# Patient Record
Sex: Male | Born: 1935 | Race: White | Hispanic: No | Smoking: Former smoker
Health system: Southern US, Community
[De-identification: ages and names within clinical notes are randomized; demographics above are authoritative.]

## PROBLEM LIST (undated history)

## (undated) DIAGNOSIS — R351 Nocturia: Secondary | ICD-10-CM

## (undated) DIAGNOSIS — E236 Other disorders of pituitary gland: Secondary | ICD-10-CM

## (undated) DIAGNOSIS — I10 Essential (primary) hypertension: Secondary | ICD-10-CM

## (undated) DIAGNOSIS — E785 Hyperlipidemia, unspecified: Secondary | ICD-10-CM

## (undated) DIAGNOSIS — D649 Anemia, unspecified: Secondary | ICD-10-CM

## (undated) DIAGNOSIS — R4182 Altered mental status, unspecified: Secondary | ICD-10-CM

## (undated) DIAGNOSIS — E559 Vitamin D deficiency, unspecified: Secondary | ICD-10-CM

## (undated) DIAGNOSIS — I739 Peripheral vascular disease, unspecified: Secondary | ICD-10-CM

## (undated) DIAGNOSIS — E1142 Type 2 diabetes mellitus with diabetic polyneuropathy: Secondary | ICD-10-CM

## (undated) DIAGNOSIS — E119 Type 2 diabetes mellitus without complications: Secondary | ICD-10-CM

## (undated) DIAGNOSIS — K219 Gastro-esophageal reflux disease without esophagitis: Secondary | ICD-10-CM

## (undated) DIAGNOSIS — M869 Osteomyelitis, unspecified: Secondary | ICD-10-CM

## (undated) DIAGNOSIS — M1990 Unspecified osteoarthritis, unspecified site: Secondary | ICD-10-CM

## (undated) DIAGNOSIS — K529 Noninfective gastroenteritis and colitis, unspecified: Secondary | ICD-10-CM

## (undated) DIAGNOSIS — E039 Hypothyroidism, unspecified: Secondary | ICD-10-CM

## (undated) DIAGNOSIS — J309 Allergic rhinitis, unspecified: Secondary | ICD-10-CM

## (undated) DIAGNOSIS — N189 Chronic kidney disease, unspecified: Secondary | ICD-10-CM

## (undated) DIAGNOSIS — Z87442 Personal history of urinary calculi: Secondary | ICD-10-CM

## (undated) DIAGNOSIS — Z860100 Personal history of colon polyps, unspecified: Secondary | ICD-10-CM

## (undated) DIAGNOSIS — Z85528 Personal history of other malignant neoplasm of kidney: Secondary | ICD-10-CM

## (undated) DIAGNOSIS — N400 Enlarged prostate without lower urinary tract symptoms: Secondary | ICD-10-CM

## (undated) DIAGNOSIS — C801 Malignant (primary) neoplasm, unspecified: Secondary | ICD-10-CM

## (undated) DIAGNOSIS — H409 Unspecified glaucoma: Secondary | ICD-10-CM

## (undated) DIAGNOSIS — E893 Postprocedural hypopituitarism: Secondary | ICD-10-CM

## (undated) DIAGNOSIS — D519 Vitamin B12 deficiency anemia, unspecified: Secondary | ICD-10-CM

## (undated) HISTORY — PX: TONSILLECTOMY: SUR1361

## (undated) HISTORY — DX: Personal history of colon polyps, unspecified: Z86.0100

## (undated) HISTORY — DX: Allergic rhinitis, unspecified: J30.9

## (undated) HISTORY — DX: Noninfective gastroenteritis and colitis, unspecified: K52.9

## (undated) HISTORY — DX: Postprocedural hypopituitarism: E89.3

## (undated) HISTORY — DX: Hyperlipidemia, unspecified: E78.5

## (undated) HISTORY — DX: Gastro-esophageal reflux disease without esophagitis: K21.9

## (undated) HISTORY — PX: OTHER SURGICAL HISTORY: SHX169

## (undated) HISTORY — DX: Other disorders of pituitary gland: E23.6

## (undated) HISTORY — DX: Benign prostatic hyperplasia without lower urinary tract symptoms: N40.0

## (undated) HISTORY — DX: Unspecified glaucoma: H40.9

## (undated) HISTORY — DX: Vitamin B12 deficiency anemia, unspecified: D51.9

## (undated) HISTORY — DX: Hypothyroidism, unspecified: E03.9

## (undated) HISTORY — DX: Personal history of other malignant neoplasm of kidney: Z85.528

## (undated) HISTORY — DX: Personal history of colonic polyps: Z86.010

## (undated) HISTORY — DX: Anemia, unspecified: D64.9

## (undated) HISTORY — PX: EYE SURGERY: SHX253

## (undated) HISTORY — DX: Vitamin D deficiency, unspecified: E55.9

---

## 1982-08-29 HISTORY — PX: CHOLECYSTECTOMY: SHX55

## 1992-08-29 DIAGNOSIS — Z85528 Personal history of other malignant neoplasm of kidney: Secondary | ICD-10-CM

## 1992-08-29 HISTORY — DX: Personal history of other malignant neoplasm of kidney: Z85.528

## 1992-08-29 HISTORY — PX: NEPHRECTOMY: SHX65

## 1998-08-19 ENCOUNTER — Other Ambulatory Visit: Admission: RE | Admit: 1998-08-19 | Discharge: 1998-08-19 | Payer: Self-pay | Admitting: Urology

## 1998-08-29 DIAGNOSIS — D519 Vitamin B12 deficiency anemia, unspecified: Secondary | ICD-10-CM

## 1998-08-29 DIAGNOSIS — E893 Postprocedural hypopituitarism: Secondary | ICD-10-CM

## 1998-08-29 DIAGNOSIS — E236 Other disorders of pituitary gland: Secondary | ICD-10-CM

## 1998-08-29 DIAGNOSIS — E039 Hypothyroidism, unspecified: Secondary | ICD-10-CM

## 1998-08-29 HISTORY — DX: Postprocedural hypopituitarism: E89.3

## 1998-08-29 HISTORY — DX: Other disorders of pituitary gland: E23.6

## 1998-08-29 HISTORY — DX: Hypothyroidism, unspecified: E03.9

## 1998-08-29 HISTORY — DX: Vitamin B12 deficiency anemia, unspecified: D51.9

## 1998-08-29 HISTORY — PX: BRAIN SURGERY: SHX531

## 1999-05-30 HISTORY — PX: OTHER SURGICAL HISTORY: SHX169

## 2000-04-03 ENCOUNTER — Other Ambulatory Visit: Admission: RE | Admit: 2000-04-03 | Discharge: 2000-04-03 | Payer: Self-pay | Admitting: Gastroenterology

## 2000-04-28 ENCOUNTER — Encounter: Payer: Self-pay | Admitting: Gastroenterology

## 2000-04-28 ENCOUNTER — Ambulatory Visit (HOSPITAL_COMMUNITY): Admission: RE | Admit: 2000-04-28 | Discharge: 2000-04-28 | Payer: Self-pay | Admitting: Gastroenterology

## 2000-08-09 ENCOUNTER — Other Ambulatory Visit: Admission: RE | Admit: 2000-08-09 | Discharge: 2000-08-09 | Payer: Self-pay | Admitting: Urology

## 2000-08-09 ENCOUNTER — Encounter (INDEPENDENT_AMBULATORY_CARE_PROVIDER_SITE_OTHER): Payer: Self-pay | Admitting: Specialist

## 2001-01-09 ENCOUNTER — Ambulatory Visit (HOSPITAL_BASED_OUTPATIENT_CLINIC_OR_DEPARTMENT_OTHER): Admission: RE | Admit: 2001-01-09 | Discharge: 2001-01-09 | Payer: Self-pay | Admitting: General Surgery

## 2001-01-09 ENCOUNTER — Encounter (INDEPENDENT_AMBULATORY_CARE_PROVIDER_SITE_OTHER): Payer: Self-pay | Admitting: *Deleted

## 2001-05-30 ENCOUNTER — Encounter: Payer: Self-pay | Admitting: Orthopedic Surgery

## 2001-05-30 ENCOUNTER — Encounter: Admission: RE | Admit: 2001-05-30 | Discharge: 2001-05-30 | Payer: Self-pay | Admitting: Orthopedic Surgery

## 2001-06-12 ENCOUNTER — Observation Stay (HOSPITAL_COMMUNITY): Admission: RE | Admit: 2001-06-12 | Discharge: 2001-06-13 | Payer: Self-pay | Admitting: Orthopedic Surgery

## 2001-08-29 DIAGNOSIS — E785 Hyperlipidemia, unspecified: Secondary | ICD-10-CM

## 2001-08-29 HISTORY — DX: Hyperlipidemia, unspecified: E78.5

## 2004-07-28 ENCOUNTER — Encounter: Admission: RE | Admit: 2004-07-28 | Discharge: 2004-07-28 | Payer: Self-pay | Admitting: Orthopedic Surgery

## 2004-08-29 HISTORY — PX: OTHER SURGICAL HISTORY: SHX169

## 2005-08-29 DIAGNOSIS — H409 Unspecified glaucoma: Secondary | ICD-10-CM

## 2005-08-29 HISTORY — DX: Unspecified glaucoma: H40.9

## 2007-07-23 ENCOUNTER — Emergency Department (HOSPITAL_COMMUNITY): Admission: EM | Admit: 2007-07-23 | Discharge: 2007-07-23 | Payer: Self-pay | Admitting: Emergency Medicine

## 2007-07-26 ENCOUNTER — Emergency Department (HOSPITAL_COMMUNITY): Admission: EM | Admit: 2007-07-26 | Discharge: 2007-07-26 | Payer: Self-pay | Admitting: Emergency Medicine

## 2007-08-30 DIAGNOSIS — E559 Vitamin D deficiency, unspecified: Secondary | ICD-10-CM

## 2007-08-30 HISTORY — DX: Vitamin D deficiency, unspecified: E55.9

## 2008-08-01 ENCOUNTER — Ambulatory Visit (HOSPITAL_BASED_OUTPATIENT_CLINIC_OR_DEPARTMENT_OTHER): Admission: RE | Admit: 2008-08-01 | Discharge: 2008-08-01 | Payer: Self-pay | Admitting: Orthopedic Surgery

## 2009-08-29 HISTORY — PX: JOINT REPLACEMENT: SHX530

## 2010-03-29 ENCOUNTER — Inpatient Hospital Stay (HOSPITAL_COMMUNITY): Admission: RE | Admit: 2010-03-29 | Discharge: 2010-04-01 | Payer: Self-pay | Admitting: Orthopedic Surgery

## 2010-11-12 LAB — BASIC METABOLIC PANEL
BUN: 17 mg/dL (ref 6–23)
Calcium: 8.8 mg/dL (ref 8.4–10.5)
Chloride: 103 mEq/L (ref 96–112)
Creatinine, Ser: 1.58 mg/dL — ABNORMAL HIGH (ref 0.4–1.5)
GFR calc Af Amer: 51 mL/min — ABNORMAL LOW (ref >60)
GFR calc non Af Amer: 42 mL/min — ABNORMAL LOW (ref >60)
GFR calc non Af Amer: 49 mL/min — ABNORMAL LOW (ref >60)
Glucose, Bld: 171 mg/dL — ABNORMAL HIGH (ref 70–99)
Potassium: 4.2 mEq/L (ref 3.5–5.1)
Potassium: 4.9 mEq/L (ref 3.5–5.1)
Sodium: 137 mEq/L (ref 135–145)

## 2010-11-12 LAB — CBC
HCT: 33.8 % — ABNORMAL LOW (ref 39.0–52.0)
Hemoglobin: 11.6 g/dL — ABNORMAL LOW (ref 13.0–17.0)
MCHC: 34.3 g/dL (ref 30.0–36.0)
Platelets: 193 10*3/uL (ref 150–400)
Platelets: 197 10*3/uL (ref 150–400)
RBC: 2.7 MIL/uL — ABNORMAL LOW (ref 4.22–5.81)
RBC: 2.98 MIL/uL — ABNORMAL LOW (ref 4.22–5.81)
RDW: 13.8 % (ref 11.5–15.5)
RDW: 13.8 % (ref 11.5–15.5)
RDW: 14 % (ref 11.5–15.5)
WBC: 12.1 10*3/uL — ABNORMAL HIGH (ref 4.0–10.5)
WBC: 12.7 10*3/uL — ABNORMAL HIGH (ref 4.0–10.5)
WBC: 13.4 10*3/uL — ABNORMAL HIGH (ref 4.0–10.5)

## 2010-11-12 LAB — PROTIME-INR
INR: 1.22 (ref 0.00–1.49)
INR: 1.59 — ABNORMAL HIGH (ref 0.00–1.49)
INR: 1.67 — ABNORMAL HIGH (ref 0.00–1.49)
Prothrombin Time: 15.3 seconds — ABNORMAL HIGH (ref 11.6–15.2)
Prothrombin Time: 19.1 seconds — ABNORMAL HIGH (ref 11.6–15.2)
Prothrombin Time: 19.9 seconds — ABNORMAL HIGH (ref 11.6–15.2)

## 2010-11-12 LAB — TYPE AND SCREEN
ABO/RH(D): O POS
Antibody Screen: NEGATIVE

## 2010-11-12 LAB — ABO/RH: ABO/RH(D): O POS

## 2010-11-13 LAB — COMPREHENSIVE METABOLIC PANEL
AST: 18 U/L (ref 0–37)
Albumin: 3.6 g/dL (ref 3.5–5.2)
Alkaline Phosphatase: 46 U/L (ref 39–117)
BUN: 18 mg/dL (ref 6–23)
GFR calc Af Amer: 56 mL/min — ABNORMAL LOW (ref >60)
Potassium: 4.2 mEq/L (ref 3.5–5.1)
Sodium: 141 mEq/L (ref 135–145)
Total Protein: 6.1 g/dL (ref 6.0–8.3)

## 2010-11-13 LAB — SURGICAL PCR SCREEN
MRSA, PCR: NEGATIVE
Staphylococcus aureus: NEGATIVE

## 2010-11-13 LAB — URINALYSIS, ROUTINE W REFLEX MICROSCOPIC
Hgb urine dipstick: NEGATIVE
Nitrite: NEGATIVE
Specific Gravity, Urine: 1.014 (ref 1.005–1.030)
Urobilinogen, UA: 0.2 mg/dL (ref 0.0–1.0)
pH: 6 (ref 5.0–8.0)

## 2010-11-13 LAB — CBC
MCV: 99.4 fL (ref 78.0–100.0)
Platelets: 253 10*3/uL (ref 150–400)
RBC: 4.2 MIL/uL — ABNORMAL LOW (ref 4.22–5.81)
RDW: 14 % (ref 11.5–15.5)
WBC: 9.1 10*3/uL (ref 4.0–10.5)

## 2010-11-13 LAB — PROTIME-INR: INR: 1.09 (ref 0.00–1.49)

## 2011-01-11 NOTE — Op Note (Signed)
NAMEFRASER, BUSCHE               ACCOUNT NO.:  [id number]   MEDICAL RECORD NO.:  [id number]          PATIENT TYPE:  AMB   LOCATION:  [facility]                         FACILITY:  [facility]   PHYSICIAN:  Ollen Gross, M.D.    DATE OF BIRTH:  [birth year]   DATE OF PROCEDURE:  08/01/2008  DATE OF DISCHARGE:                               OPERATIVE REPORT   PREOPERATIVE DIAGNOSIS:  Left knee medial meniscal tear.   POSTOPERATIVE DIAGNOSIS:  Left knee medial meniscal tear.   PROCEDURE:  Left knee arthroscopy with meniscal debridement.   ASSISTANT:  None.   ANESTHESIA:  General.   ESTIMATED BLOOD LOSS:  Minimal.   DRAINS:  None.   COMPLICATIONS:  None.   CONDITION.:  Stable, to the recovery room.   CLINICAL NOTE:  This patient is an 77 year old male who has a several-  month history of significant left knee pain and mechanical symptoms.  He  does have some slight degenerative change on his plain x-rays.  His exam  and history suggest a medial meniscal tear, confirmed by MRI.  He  presents now for arthroscopy and debridement.   PROCEDURE IN DETAIL:  After successful administration of general  anesthetic, a tourniquet was placed high on the left thigh and left  lower extremity prepped and draped in the usual sterile fashion.  A  standard superomedial inferolateral incision was made.  In-flow cannula  passed superomedial and camera passed inferolateral.  Arthroscopic  visualization proceeds.  The undersurface of the patella showed some  grade 1 and 2 change but no unstable cartilage lesions.  The trochlea  has a similar appearance with no unstable lesions.  The medial and  lateral gutters were visualized.  There were no loose bodies.  Flexion  and valgus force was applied to the knee and the medial compartment was  entered.  There was evidence of a significant tear in the body and  posterior horn of the medial meniscus.  There was also evidence of some  exposed bone on the far  posteromedial aspect of the tibia as well as  posteromedial aspect of the femur.  A spinal needle was used to localize  the inferomedial portal.  A small incision made, dilator placed and then  using a combination of baskets and a 4.2-mm shaver to debride the  meniscus back to a stable base.  There was exposed bone but no unstable  cartilage on the surfaces of the medial femoral condyle and medial  tibial plateau.  The lesion size about 1.1 cm x 1.2 cm on each surface.  The intercondylar notch was then visualized.  The ACL was normal.  Lateral compartment was entered and it was normal.  The joint was again  inspected.  There were no further tears, loose bodies, or chondral  defects.  The arthroscopic equipment was then  removed from the inferior portals which were closed with interrupted 4-0  nylon.  Twenty milliliters of 0.25% Marcaine with epi was injected  through the inflow cannula, and then that was removed and that portal  closed with nylon.  A bulky sterile dressing was  applied, and he was  awakened and transferred to recovery in stable condition.      Ollen Gross, M.D.  Electronically Signed     FA/MEDQ  D:  08/01/2008  T:  08/01/2008  Job:  [id number]

## 2011-01-14 NOTE — Op Note (Signed)
[facility]  Patient:    [name] [name], [name] [name]. Visit Number: [id number] MRN: [id number]          Service Type: Attending:  Vania Rea. Supple, M.D. Dictated by:   Vania Rea. Supple, M.D. Proc. Date: 06/12/01                             Operative Report  PREOPERATIVE DIAGNOSIS:  1. Recurrent right shoulder rotator cuff tear.  2. Right shoulder symptomatic acromioclavicular joint arthrosis.  3. Retained metallic suture anchors in the humeral head from prior rotator cuff repair.  POSTOPERATIVE DIAGNOSIS: 1. Recurrent right shoulder rotator cuff tear.  2. Right shoulder symptomatic acromioclavicular joint arthrosis.  3. Retained metallic suture anchors in the humeral head from prior rotator cuff repair.  OPERATION:  1. Open right shoulder subacromial decompression.  2. Right shoulder distal clavicle resection. 3. Rotator cuff repair.  4. Removal of retained metallic suture anchor from the humeral head.  SURGEON:  Vania Rea. Supple, M.D.  ASSISTANT:  Denton Brick, PA-C  ANESTHESIA:  Nasotracheal general.  ESTIMATED BLOOD LOSS:  Less than 100 cc.  DRAINS:  None.  HISTORY:  [name] [name] is a 77 year old gentleman who, approximately eight or nine years ago, underwent a right shoulder rotator cuff repair and did quite well until the last six months or so, during which time he has had a recurrence of right shoulder pain.  His clinical examination shows weakness of the rotator cuff with a markedly positive impingement sign.  Plain films show marked arthropathy of the [facility] joint and preoperative arthrogram confirms a full thickness rotator cuff tear.  Due to his persistent pain and functional limitations, he is brought to the operating room at this time for planned rotator cuff repair ___________ .  Preoperatively he was counseled on treatment options as well as risks versus benefits thereof.  Possible complications of bleeding, infection, neurovascular injury,  persistence of pain, loss of motion, recurrence of rotator cuff tear and possible need for additional surgery were all reviewed. He understands and accepts and agrees with our planned procedure.  DESCRIPTION OF PROCEDURE:  After undergoing routine preoperative evaluation, patient had an interscalene block established in the holding area by the anesthesia department.  He was then brought to the operating room, placed supine on the operating table where an awake intubation was performed using a nasotracheal technique. This was performed because he does have a very difficult airway and has had difficulty with intubation in the past.  A successful nasotracheal intubation was then obtained without obvious sequelae. The patient was then placed into the beach-chair position and the right shoulder girdle region was sterilely prepped and draped in standard fashion. He did receive a gram of IV ___________ prophylactically.  An anterolateral incision was then made through his previous surgical incision with a total length of approximately 8 cm.  We did infiltrate the skin edges with 0.5% Marcaine with epinephrine at the beginning of the case.  Sharp dissection was carried down through the skin and subcutaneous tissue with electrocautery used for hemostasis.  We then divided the deltoid at the junction between the anterior and mid thirds at the previous site of the surgical dissection. Several Ethibond sutures were removed in the process.  Self-retaining retractors were placed allowing visualization of the anterior acromion as well as the [facility] joint.  The distal clavicle was then exposed with subperiosteal dissection.  We utilized an oscillating saw to perform a  distal clavicle resection, removing approximately 1 cm of the distal clavicle.  We then exposed the anterior acromion and performed an anterior inferior acromionectomy with the oscillating saw, creating a type 1 acromion morphology.  Abundant  proliferative bursal tissue was then meticulously debrided from the subacromial space. Inspection of the rotator cuff showed that the majority of the cuff was intact.  However, there were two areas where there were apparent full thickness defects, and both of these were in relation to previously placed sutures from the prior repair.  On closer inspection, it did appear as though a 2 to 3 cm region of the supraspinatus as well as a portion of the infraspinous was markedly attenuated, and at this point, we went ahead and divided the rotator cuff away from the greater tuberosity over that section which appeared to be markedly thinned.  We used a 15 blade to trim the rotator cuff back to a healthy appearing margin.  We also removed several retained sutures and we also found one of the  metallic suture anchors which was somewhat prominent on the greater tuberosity and this was removed by circumferential dissection and grasping with a rongeur.  The rotator cuff was then mobilized in its superior and inferior leaflets.  The glenohumeral joint itself was inspected and copiously irrigated.  We utilized #2 Ethibond sutures in a modified Kessler grasping technique to grasp the free margin of the rotator cuff.  There was good mobility noted of the tissue.  We then utilized a curved half-inch osteotome to create a bony trough on the greater tuberosity at the osteoarticular junction.  The Concept medium tenaculum was then used to create a series of three bony tunnels across the greater tuberosity.  We then passed the free limbs of the sutures through the bony tunnels.  These were then tied sequentially from posterior to anterior, bringing the free margin rotator cuff down into excellent apposition into the bony cancellous bed on the greater tuberosity.  We then oversewed a small portion with a #1 Vicryl suture, as well, to reinforce the repair.  The arm was taken through a range of motion showing good  stability of the repair.  No evidence for excessive tension with the arm at the side.  Final irrigation was performed.  We then  closed the deltoid split utilizing a #1 Vicryl suture in a pants-over-vest technique and then reinforced this with figure-of-eight #1 Vicryl sutures. 2-0 Vicryl was then used to close the subcutaneous layer and staples were used to close the skin.  An additional 10 cc of 0.5% Marcaine with epinephrine was instilled along the skin edges at the end of the case.  Xeroform and a bulky dry dressing were then placed over the right shoulder.  The right arm was placed in a shoulder immobilizer.  The patient was then extubated and taken to the recovery room in stable condition. Dictated by:   Vania Rea. Supple, M.D. Attending:  Vania Rea. Supple, M.D. DD:  06/12/01 TD:  06/12/01 Job: [id number]

## 2011-03-25 ENCOUNTER — Other Ambulatory Visit: Payer: Self-pay | Admitting: Dermatology

## 2011-06-02 LAB — POCT I-STAT 4, (NA,K, GLUC, HGB,HCT): Glucose, Bld: 98 mg/dL (ref 70–99)

## 2011-06-07 LAB — CBC
Hemoglobin: 14.4
MCHC: 34.7
MCV: 95
RBC: 4.36

## 2011-07-13 DIAGNOSIS — H04129 Dry eye syndrome of unspecified lacrimal gland: Secondary | ICD-10-CM | POA: Insufficient documentation

## 2011-08-17 DIAGNOSIS — H168 Other keratitis: Secondary | ICD-10-CM | POA: Insufficient documentation

## 2011-08-30 DIAGNOSIS — N400 Enlarged prostate without lower urinary tract symptoms: Secondary | ICD-10-CM

## 2011-08-30 HISTORY — DX: Benign prostatic hyperplasia without lower urinary tract symptoms: N40.0

## 2011-11-30 ENCOUNTER — Encounter (HOSPITAL_COMMUNITY): Payer: Self-pay | Admitting: Pharmacy Technician

## 2011-12-08 ENCOUNTER — Encounter (HOSPITAL_COMMUNITY)
Admission: RE | Admit: 2011-12-08 | Discharge: 2011-12-08 | Disposition: A | Discharge status: Home / Self Care | Payer: Medicare Other | Source: Ambulatory Visit | Attending: Orthopedic Surgery | Admitting: Orthopedic Surgery

## 2011-12-08 ENCOUNTER — Encounter (HOSPITAL_COMMUNITY): Payer: Self-pay

## 2011-12-08 ENCOUNTER — Encounter (HOSPITAL_COMMUNITY)
Admission: RE | Admit: 2011-12-08 | Discharge: 2011-12-08 | Disposition: A | Discharge status: Home / Self Care | Payer: Medicare Other | Source: Ambulatory Visit | Attending: Anesthesiology | Admitting: Anesthesiology

## 2011-12-08 HISTORY — DX: Essential (primary) hypertension: I10

## 2011-12-08 HISTORY — DX: Unspecified osteoarthritis, unspecified site: M19.90

## 2011-12-08 LAB — BASIC METABOLIC PANEL
BUN: 34 mg/dL — ABNORMAL HIGH (ref 6–23)
Calcium: 9.7 mg/dL (ref 8.4–10.5)
Creatinine, Ser: 1.41 mg/dL — ABNORMAL HIGH (ref 0.50–1.35)
GFR calc Af Amer: 50 mL/min — ABNORMAL LOW (ref >90)
GFR calc non Af Amer: 44 mL/min — ABNORMAL LOW (ref >90)
Glucose, Bld: 97 mg/dL (ref 70–99)

## 2011-12-08 LAB — CBC
Hemoglobin: 13.8 g/dL (ref 13.0–17.0)
MCH: 33.5 pg (ref 26.0–34.0)
MCHC: 33.4 g/dL (ref 30.0–36.0)
Platelets: 202 10*3/uL (ref 150–400)
RDW: 13.7 % (ref 11.5–15.5)

## 2011-12-08 LAB — SURGICAL PCR SCREEN
MRSA, PCR: NEGATIVE
Staphylococcus aureus: NEGATIVE

## 2011-12-08 MED ORDER — CHLORHEXIDINE GLUCONATE 4 % EX LIQD: 60.0000 mL | Freq: Once | CUTANEOUS | Status: DC

## 2011-12-08 NOTE — Pre-Procedure Instructions (Addendum)
20 KAVISH LAFITTE  12/08/2011   Your procedure is scheduled on:  12/15/2011  Report to [facility] at 0530 AM.  Call this number if you have problems the morning of surgery: [phone]   Remember:   Do not eat food:After Midnight.  May have clear liquids: up to 4 Hours before arrival.  Do not drink liquids after 0130 am the day of surgery  Clear liquids include soda, tea, black coffee, apple or grape juice, broth.  Take these medicines the morning of surgery with A SIP OF WATER: dolycycline  proscar  Synthroid  prednisolone   Do not wear jewelry, make-up or nail polish.  Do not wear lotions, powders, or perfumes. You may wear deodorant.  Do not shave 48 hours prior to surgery.  Do not bring valuables to the hospital.  Contacts, dentures or bridgework may not be worn into surgery.  Leave suitcase in the car. After surgery it may be brought to your room.  For patients admitted to the hospital, checkout time is 11:00 AM the day of discharge.   Patients discharged the day of surgery will not be allowed to drive home.  Name and phone number of your driver: [phone]   Son Onalee Hua  Special Instructions: CHG Shower Use Special Wash: 1/2 bottle night before surgery and 1/2 bottle morning of surgery.   Please read over the following fact sheets that you were given: Pain Booklet, Coughing and Deep Breathing, MRSA Information and Surgical Site Infection Prevention

## 2011-12-09 ENCOUNTER — Encounter (HOSPITAL_COMMUNITY): Payer: Self-pay | Admitting: Vascular Surgery

## 2011-12-09 NOTE — Consult Note (Signed)
Anesthesia:  Patient is a 78 year old male scheduled for a right shoulder reverse arthroplasty on 12/15/11.  History includes difficult intubation in 1994.  I was not asked to see him during his PAT visit.  He has had three surgeries at [facility] since.  The last was on 03/29/10 (left TKA) which was done with an LMA and a spinal (I believe). He also had a knee arthroscopy under [facility] in December 2009 and a right rotator cuff repair in October 2002 that required nasotracheal intubation.  (I have requested these Anesthesia records from [facility] Medical Records.)     Other past medical history includes HTN, arthritis, hypothyroidism.  Smoking status not assessed by his PAT nurse.  His last PCP was listed as Dr. Leslie Dales.  Labs noted. Cr 1.41.    CXR shows no active disease.  EKG from 12/08/11 shows SB @ 57, sinus arrhythmia, LAD, right BBB.  It appears stable since at least 2009 (which appeared stable since October 2002).  No CV symptoms were reported.  He has no documented history of MI/CHF or DM.  Anticipate he can proceed.  He will be evaluated by his Anesthesiologist on the day of surgery to determine a definitive Anesthesia plan at that time.  His records for [facility] should be on his chart for review by that time.

## 2011-12-09 NOTE — Anesthesia Preprocedure Evaluation (Addendum)
Anesthesia Evaluation  Patient identified by MRN, date of birth, ID band Patient awake  General Assessment Comment:Difficult intubation in 1994.  Required NT intubation in '02.  Reviewed: Allergy & Precautions, H&P , NPO status , Patient's Chart, lab work & pertinent test results, reviewed documented beta blocker date and time   History of Anesthesia Complications (+) DIFFICULT AIRWAY  Airway Mallampati: III TM Distance: >3 FB Neck ROM: Full  Mouth opening: Limited Mouth Opening  Dental  (+) Teeth Intact and Dental Advisory Given   Pulmonary  breath sounds clear to auscultation        Cardiovascular hypertension, Rhythm:Regular Rate:Normal     Neuro/Psych    GI/Hepatic   Endo/Other    Renal/GU      Musculoskeletal   Abdominal   Peds  Hematology   Anesthesia Other Findings   Reproductive/Obstetrics                         Anesthesia Physical Anesthesia Plan  ASA: III  Anesthesia Plan: General   Post-op Pain Management:    Induction: Intravenous  Airway Management Planned: Oral ETT and Awake Intubation Planned  Additional Equipment:   Intra-op Plan:   Post-operative Plan: Extubation in OR  Informed Consent: I have reviewed the patients History and Physical, chart, labs and discussed the procedure including the risks, benefits and alternatives for the proposed anesthesia with the patient or authorized representative who has indicated his/her understanding and acceptance.   Dental advisory given  Plan Discussed with: CRNA, Anesthesiologist and Surgeon  Anesthesia Plan Comments: (See Anesthesia consult note and past Anesthesia records re: history of difficult intubation. Shonna Chock, PA-C)       Anesthesia Quick Evaluation

## 2011-12-14 MED ORDER — CEFAZOLIN SODIUM-DEXTROSE 2-3 GM-% IV SOLR
2.0000 g | INTRAVENOUS | Status: AC
  Administered 2011-12-15: 2 g via INTRAVENOUS
  Filled 2011-12-14: qty 50

## 2011-12-15 ENCOUNTER — Encounter (HOSPITAL_COMMUNITY)
Admission: RE | Disposition: A | Discharge status: Skilled Nursing Facility | Payer: Self-pay | Source: Ambulatory Visit | Attending: Orthopedic Surgery

## 2011-12-15 ENCOUNTER — Encounter (HOSPITAL_COMMUNITY): Payer: Self-pay | Admitting: *Deleted

## 2011-12-15 ENCOUNTER — Encounter (HOSPITAL_COMMUNITY): Payer: Self-pay | Admitting: Vascular Surgery

## 2011-12-15 ENCOUNTER — Ambulatory Visit (HOSPITAL_COMMUNITY): Payer: Medicare Other | Admitting: Vascular Surgery

## 2011-12-15 ENCOUNTER — Inpatient Hospital Stay (HOSPITAL_COMMUNITY)
Admission: RE | Admit: 2011-12-15 | Discharge: 2011-12-17 | DRG: 484 | Disposition: A | Discharge status: Skilled Nursing Facility | Payer: Medicare Other | Source: Ambulatory Visit | Attending: Orthopedic Surgery | Admitting: Orthopedic Surgery

## 2011-12-15 DIAGNOSIS — M19019 Primary osteoarthritis, unspecified shoulder: Principal | ICD-10-CM | POA: Diagnosis present

## 2011-12-15 DIAGNOSIS — Z01810 Encounter for preprocedural cardiovascular examination: Secondary | ICD-10-CM

## 2011-12-15 DIAGNOSIS — Z96659 Presence of unspecified artificial knee joint: Secondary | ICD-10-CM

## 2011-12-15 DIAGNOSIS — Z01812 Encounter for preprocedural laboratory examination: Secondary | ICD-10-CM

## 2011-12-15 DIAGNOSIS — M19011 Primary osteoarthritis, right shoulder: Secondary | ICD-10-CM

## 2011-12-15 DIAGNOSIS — Z01818 Encounter for other preprocedural examination: Secondary | ICD-10-CM

## 2011-12-15 DIAGNOSIS — R339 Retention of urine, unspecified: Secondary | ICD-10-CM | POA: Diagnosis not present

## 2011-12-15 DIAGNOSIS — I10 Essential (primary) hypertension: Secondary | ICD-10-CM | POA: Diagnosis present

## 2011-12-15 HISTORY — PX: REVERSE SHOULDER ARTHROPLASTY: SHX5054

## 2011-12-15 LAB — TYPE AND SCREEN

## 2011-12-15 SURGERY — ARTHROPLASTY, SHOULDER, TOTAL, REVERSE
Anesthesia: General | Site: Shoulder | Laterality: Right | Wound class: Clean

## 2011-12-15 MED ORDER — LIDOCAINE HCL 4 % MT SOLN
OROMUCOSAL | Status: DC | PRN
  Administered 2011-12-15: 4 mL via TOPICAL

## 2011-12-15 MED ORDER — FINASTERIDE 5 MG PO TABS
5.0000 mg | ORAL_TABLET | Freq: Every day | ORAL | Status: DC
  Administered 2011-12-16 – 2011-12-17 (×2): 5 mg via ORAL
  Filled 2011-12-15 (×3): qty 1

## 2011-12-15 MED ORDER — SIMVASTATIN 40 MG PO TABS
40.0000 mg | ORAL_TABLET | Freq: Every day | ORAL | Status: DC
  Administered 2011-12-16: 40 mg via ORAL
  Filled 2011-12-15 (×3): qty 1

## 2011-12-15 MED ORDER — ONDANSETRON HCL 4 MG/2ML IJ SOLN
INTRAMUSCULAR | Status: DC | PRN
  Administered 2011-12-15: 4 mg via INTRAVENOUS

## 2011-12-15 MED ORDER — MIDAZOLAM HCL 5 MG/5ML IJ SOLN
INTRAMUSCULAR | Status: DC | PRN
  Administered 2011-12-15: 2 mg via INTRAVENOUS

## 2011-12-15 MED ORDER — ACETAMINOPHEN 650 MG RE SUPP: 650.0000 mg | Freq: Four times a day (QID) | RECTAL | Status: DC | PRN

## 2011-12-15 MED ORDER — IRBESARTAN 300 MG PO TABS
300.0000 mg | ORAL_TABLET | Freq: Every day | ORAL | Status: DC
  Administered 2011-12-16 – 2011-12-17 (×2): 300 mg via ORAL
  Filled 2011-12-15 (×3): qty 1

## 2011-12-15 MED ORDER — LACTATED RINGERS IV SOLN: INTRAVENOUS | Status: DC

## 2011-12-15 MED ORDER — FENTANYL CITRATE 0.05 MG/ML IJ SOLN
INTRAMUSCULAR | Status: DC | PRN
  Administered 2011-12-15 (×2): 50 ug via INTRAVENOUS

## 2011-12-15 MED ORDER — PREDNISOLONE 5 MG PO TABS
5.0000 mg | ORAL_TABLET | Freq: Every day | ORAL | Status: DC
  Administered 2011-12-16 – 2011-12-17 (×2): 5 mg via ORAL
  Filled 2011-12-15 (×3): qty 1

## 2011-12-15 MED ORDER — MENTHOL 3 MG MT LOZG: 1.0000 | LOZENGE | OROMUCOSAL | Status: DC | PRN

## 2011-12-15 MED ORDER — PHENYLEPHRINE HCL 10 MG/ML IJ SOLN
INTRAMUSCULAR | Status: DC | PRN
  Administered 2011-12-15 (×5): 80 ug via INTRAVENOUS

## 2011-12-15 MED ORDER — LEVOTHYROXINE SODIUM 88 MCG PO TABS
88.0000 ug | ORAL_TABLET | Freq: Every day | ORAL | Status: DC
  Administered 2011-12-16 – 2011-12-17 (×2): 88 ug via ORAL
  Filled 2011-12-15 (×3): qty 1

## 2011-12-15 MED ORDER — LIDOCAINE HCL 4 % IJ SOLN
INTRAMUSCULAR | Status: DC | PRN
  Administered 2011-12-15: 25 mL via INTRADERMAL

## 2011-12-15 MED ORDER — EPHEDRINE SULFATE 50 MG/ML IJ SOLN
INTRAMUSCULAR | Status: DC | PRN
  Administered 2011-12-15: 15 mg via INTRAVENOUS
  Administered 2011-12-15: 5 mg via INTRAVENOUS
  Administered 2011-12-15 (×2): 10 mg via INTRAVENOUS

## 2011-12-15 MED ORDER — DOXYCYCLINE MONOHYDRATE 50 MG PO TABS: 50.0000 mg | ORAL_TABLET | Freq: Every day | ORAL | Status: DC

## 2011-12-15 MED ORDER — SODIUM CHLORIDE 0.9 % IR SOLN
Status: DC | PRN
  Administered 2011-12-15: 1000 mL

## 2011-12-15 MED ORDER — MORPHINE SULFATE 2 MG/ML IJ SOLN: 0.0500 mg/kg | INTRAMUSCULAR | Status: DC | PRN

## 2011-12-15 MED ORDER — DIAZEPAM 2 MG PO TABS
2.0000 mg | ORAL_TABLET | Freq: Four times a day (QID) | ORAL | Status: DC | PRN
  Administered 2011-12-16: 2 mg via ORAL
  Filled 2011-12-15: qty 1

## 2011-12-15 MED ORDER — LACTATED RINGERS IV SOLN
INTRAVENOUS | Status: DC | PRN
  Administered 2011-12-15 (×2): via INTRAVENOUS

## 2011-12-15 MED ORDER — ROCURONIUM BROMIDE 100 MG/10ML IV SOLN
INTRAVENOUS | Status: DC | PRN
  Administered 2011-12-15: 30 mg via INTRAVENOUS

## 2011-12-15 MED ORDER — METOCLOPRAMIDE HCL 10 MG PO TABS: 5.0000 mg | ORAL_TABLET | Freq: Three times a day (TID) | ORAL | Status: DC | PRN

## 2011-12-15 MED ORDER — PHENOL 1.4 % MT LIQD
1.0000 | OROMUCOSAL | Status: DC | PRN
  Administered 2011-12-16: 1 via OROMUCOSAL
  Filled 2011-12-15 (×2): qty 177

## 2011-12-15 MED ORDER — DOXYCYCLINE HYCLATE 100 MG PO TABS
50.0000 mg | ORAL_TABLET | Freq: Every day | ORAL | Status: DC
  Administered 2011-12-16 – 2011-12-17 (×2): 50 mg via ORAL
  Filled 2011-12-15 (×3): qty 0.5

## 2011-12-15 MED ORDER — ACETAMINOPHEN 325 MG PO TABS: 650.0000 mg | ORAL_TABLET | Freq: Four times a day (QID) | ORAL | Status: DC | PRN

## 2011-12-15 MED ORDER — METOCLOPRAMIDE HCL 5 MG/ML IJ SOLN
5.0000 mg | Freq: Three times a day (TID) | INTRAMUSCULAR | Status: DC | PRN
  Administered 2011-12-16: 10 mg via INTRAVENOUS
  Filled 2011-12-15: qty 2

## 2011-12-15 MED ORDER — PROPOFOL 10 MG/ML IV EMUL
INTRAVENOUS | Status: DC | PRN
  Administered 2011-12-15: 120 mg via INTRAVENOUS

## 2011-12-15 MED ORDER — LACTATED RINGERS IV SOLN
INTRAVENOUS | Status: DC
  Administered 2011-12-16: 09:00:00 via INTRAVENOUS

## 2011-12-15 MED ORDER — ONDANSETRON HCL 4 MG/2ML IJ SOLN: 4.0000 mg | Freq: Once | INTRAMUSCULAR | Status: DC | PRN

## 2011-12-15 MED ORDER — HYDROMORPHONE HCL 2 MG PO TABS
2.0000 mg | ORAL_TABLET | ORAL | Status: DC | PRN
  Administered 2011-12-16 (×2): 2 mg via ORAL
  Filled 2011-12-15 (×2): qty 1

## 2011-12-15 MED ORDER — ONDANSETRON HCL 4 MG PO TABS: 4.0000 mg | ORAL_TABLET | Freq: Four times a day (QID) | ORAL | Status: DC | PRN

## 2011-12-15 MED ORDER — DOXYCYCLINE HYCLATE 50 MG PO CAPS
50.0000 mg | ORAL_CAPSULE | Freq: Every day | ORAL | Status: DC
  Filled 2011-12-15: qty 1

## 2011-12-15 MED ORDER — CEFAZOLIN SODIUM 1-5 GM-% IV SOLN
1.0000 g | Freq: Four times a day (QID) | INTRAVENOUS | Status: AC
  Administered 2011-12-15 – 2011-12-16 (×3): 1 g via INTRAVENOUS
  Filled 2011-12-15 (×3): qty 50

## 2011-12-15 MED ORDER — HYDROMORPHONE HCL PF 1 MG/ML IJ SOLN
0.5000 mg | INTRAMUSCULAR | Status: DC | PRN
  Administered 2011-12-16: 1 mg via INTRAVENOUS
  Filled 2011-12-15: qty 1

## 2011-12-15 MED ORDER — HYDROMORPHONE HCL PF 1 MG/ML IJ SOLN: 0.2500 mg | INTRAMUSCULAR | Status: DC | PRN

## 2011-12-15 MED ORDER — GLYCOPYRROLATE 0.2 MG/ML IJ SOLN
INTRAMUSCULAR | Status: DC | PRN
  Administered 2011-12-15: 0.4 mg via INTRAVENOUS
  Administered 2011-12-15: 0.2 mg via INTRAVENOUS

## 2011-12-15 MED ORDER — BUPIVACAINE-EPINEPHRINE PF 0.5-1:200000 % IJ SOLN
INTRAMUSCULAR | Status: DC | PRN
  Administered 2011-12-15: 25 mL

## 2011-12-15 MED ORDER — ONDANSETRON HCL 4 MG/2ML IJ SOLN
4.0000 mg | Freq: Four times a day (QID) | INTRAMUSCULAR | Status: DC | PRN
  Administered 2011-12-15: 4 mg via INTRAVENOUS
  Filled 2011-12-15: qty 2

## 2011-12-15 MED ORDER — NEOSTIGMINE METHYLSULFATE 1 MG/ML IJ SOLN
INTRAMUSCULAR | Status: DC | PRN
  Administered 2011-12-15: 3 mg via INTRAVENOUS

## 2011-12-15 SURGICAL SUPPLY — 72 items
BIT DRILL 170X2.5X (BIT) ×1 IMPLANT
BIT DRL 170X2.5X (BIT) ×2
BLADE SAW SGTL 83.5X18.5 (BLADE) ×3 IMPLANT
CEMENT BONE DEPUY (Cement) ×4 IMPLANT
CEMENT RESTRICTOR DEPUY SZ 3 (Cement) ×2 IMPLANT
CLOTH BEACON ORANGE TIMEOUT ST (SAFETY) ×3 IMPLANT
CLSR STERI-STRIP ANTIMIC 1/2X4 (GAUZE/BANDAGES/DRESSINGS) ×2 IMPLANT
COVER SURGICAL LIGHT HANDLE (MISCELLANEOUS) ×3 IMPLANT
DRAPE INCISE IOBAN 66X45 STRL (DRAPES) ×3 IMPLANT
DRAPE SURG 17X11 SM STRL (DRAPES) ×3 IMPLANT
DRAPE SURG 17X23 STRL (DRAPES) ×3 IMPLANT
DRAPE U-SHAPE 47X51 STRL (DRAPES) ×3 IMPLANT
DRILL 2.5 (BIT) ×3
DRILL BIT 7/64X5 (BIT) IMPLANT
DRSG MEPILEX BORDER 4X8 (GAUZE/BANDAGES/DRESSINGS) ×1 IMPLANT
DURAPREP 26ML APPLICATOR (WOUND CARE) ×3 IMPLANT
ELECT BLADE 4.0 EZ CLEAN MEGAD (MISCELLANEOUS) ×3
ELECT CAUTERY BLADE 6.4 (BLADE) ×3 IMPLANT
ELECT REM PT RETURN 9FT ADLT (ELECTROSURGICAL) ×3
ELECTRODE BLDE 4.0 EZ CLN MEGD (MISCELLANEOUS) ×2 IMPLANT
ELECTRODE REM PT RTRN 9FT ADLT (ELECTROSURGICAL) ×2 IMPLANT
FACESHIELD LNG OPTICON STERILE (SAFETY) ×9 IMPLANT
GLOVE BIO SURGEON STRL SZ7.5 (GLOVE) ×3 IMPLANT
GLOVE BIO SURGEON STRL SZ8 (GLOVE) ×5 IMPLANT
GLOVE BIO SURGEON STRL SZ8.5 (GLOVE) ×4 IMPLANT
GLOVE BIOGEL PI IND STRL 6.5 (GLOVE) ×1 IMPLANT
GLOVE BIOGEL PI INDICATOR 6.5 (GLOVE) ×1
GLOVE ECLIPSE 8.5 STRL (GLOVE) ×2 IMPLANT
GLOVE EUDERMIC 7 POWDERFREE (GLOVE) ×3 IMPLANT
GLOVE SS BIOGEL STRL SZ 6.5 (GLOVE) ×1 IMPLANT
GLOVE SS BIOGEL STRL SZ 7.5 (GLOVE) ×2 IMPLANT
GLOVE SUPERSENSE BIOGEL SZ 6.5 (GLOVE) ×1
GLOVE SUPERSENSE BIOGEL SZ 7.5 (GLOVE) ×1
GOWN PREVENTION PLUS XXLARGE (GOWN DISPOSABLE) ×2 IMPLANT
GOWN STRL NON-REIN LRG LVL3 (GOWN DISPOSABLE) ×3 IMPLANT
GOWN STRL REIN XL XLG (GOWN DISPOSABLE) ×6 IMPLANT
KIT BASIN OR (CUSTOM PROCEDURE TRAY) ×3 IMPLANT
KIT ROOM TURNOVER OR (KITS) ×3 IMPLANT
MANIFOLD NEPTUNE II (INSTRUMENTS) ×3 IMPLANT
NDL HYPO 25GX1X1/2 BEV (NEEDLE) IMPLANT
NDL SUT 6 .5 CRC .975X.05 MAYO (NEEDLE) ×2 IMPLANT
NEEDLE HYPO 25GX1X1/2 BEV (NEEDLE) IMPLANT
NEEDLE MAYO TAPER (NEEDLE) ×3
NS IRRIG 1000ML POUR BTL (IV SOLUTION) ×3 IMPLANT
PACK SHOULDER (CUSTOM PROCEDURE TRAY) ×3 IMPLANT
PAD ARMBOARD 7.5X6 YLW CONV (MISCELLANEOUS) ×6 IMPLANT
PASSER SUT SWANSON 36MM LOOP (INSTRUMENTS) IMPLANT
PIN GUIDE 1.2 (PIN) IMPLANT
PIN GUIDE GLENOPHERE 1.5MX300M (PIN) IMPLANT
PIN METAGLENE 2.5 (PIN) IMPLANT
SLING ARM FOAM STRAP LRG (SOFTGOODS) ×2 IMPLANT
SLING ARM IMMOBILIZER LRG (SOFTGOODS) ×1 IMPLANT
SMARTMIX MINI TOWER (MISCELLANEOUS) ×3
SPONGE LAP 18X18 X RAY DECT (DISPOSABLE) ×3 IMPLANT
SPONGE LAP 4X18 X RAY DECT (DISPOSABLE) ×3 IMPLANT
STRIP CLOSURE SKIN 1/2X4 (GAUZE/BANDAGES/DRESSINGS) ×1 IMPLANT
SUCTION FRAZIER TIP 10 FR DISP (SUCTIONS) ×3 IMPLANT
SUT BONE WAX W31G (SUTURE) IMPLANT
SUT FIBERWIRE #2 38 T-5 BLUE (SUTURE) ×6
SUT MNCRL AB 3-0 PS2 18 (SUTURE) ×3 IMPLANT
SUT VIC AB 1 CT1 27 (SUTURE) ×3
SUT VIC AB 1 CT1 27XBRD ANBCTR (SUTURE) ×4 IMPLANT
SUT VIC AB 2-0 CT1 27 (SUTURE) ×6
SUT VIC AB 2-0 CT1 TAPERPNT 27 (SUTURE) ×4 IMPLANT
SUTURE FIBERWR #2 38 T-5 BLUE (SUTURE) ×4 IMPLANT
SYR 30ML SLIP (SYRINGE) ×1 IMPLANT
SYR CONTROL 10ML LL (SYRINGE) IMPLANT
TOWEL OR 17X24 6PK STRL BLUE (TOWEL DISPOSABLE) ×3 IMPLANT
TOWEL OR 17X26 10 PK STRL BLUE (TOWEL DISPOSABLE) ×3 IMPLANT
TOWER CARTRIDGE SMART MIX (DISPOSABLE) ×2 IMPLANT
TOWER SMARTMIX MINI (MISCELLANEOUS) ×2 IMPLANT
WATER STERILE IRR 1000ML POUR (IV SOLUTION) ×3 IMPLANT

## 2011-12-15 NOTE — Transfer of Care (Signed)
Immediate Anesthesia Transfer of Care Note  Patient: [name] [name]  Procedure(s) Performed: Procedure(s) (LRB): REVERSE SHOULDER ARTHROPLASTY (Right)  Patient Location: PACU  Anesthesia Type: General  Level of Consciousness: awake, alert , oriented and patient cooperative  Airway & Oxygen Therapy: Patient Spontanous Breathing and Patient connected to face mask oxygen  Post-op Assessment: Report given to PACU RN, Post -op Vital signs reviewed and stable and Patient moving all extremities  Post vital signs: Reviewed and stable  Complications: No apparent anesthesia complications

## 2011-12-15 NOTE — H&P (Signed)
[name] [name]    Chief Complaint: RIGHT SHOULDER ROTATOR CUFF TEAR ARTHROPLASTY HPI: The patient is a 78 y.o. male with end stage right shoulder rotator cuff tear Arthropathy  Past Medical History  Diagnosis Date  . Hypertension   . Arthritis   . Difficult intubation 1994    surgery had to be  stopped due to injury to "throat"  . Difficult intubation 1994    no trouble since.  Required nasotracheal intubation  '02 [facility]     Past Surgical History  Procedure Date  . Tonsillectomy   . Eye surgery over last 6 yrs.      trebeculectomy...   . Eye surgery  cat ext ou  . Brain surgery 2000    pituatary gland removed .  Marland Kitchen Joint replacement 2011    knee left  . Cholecystectomy 80's    Family History  Problem Relation Age of Onset  . Anesthesia problems Neg Hx   . Hypotension Neg Hx   . Malignant hyperthermia Neg Hx   . Pseudochol deficiency Neg Hx     Social History:  does not have a smoking history on file. He does not have any smokeless tobacco history on file. He reports that he drinks about 4.2 ounces of alcohol per week. He reports that he does not use illicit drugs.  Allergies:  Allergies  Allergen Reactions  . Percocet (Oxycodone-Acetaminophen) Other (See Comments)    Just doesn't like it    Medications Prior to Admission  Medication Dose Route Frequency Provider Last Rate Last Dose  . ceFAZolin (ANCEF) IVPB 2 g/50 mL premix  2 g Intravenous 60 min Pre-Op Ralene Bathe, PA      . lactated ringers infusion   Intravenous Continuous Ralene Bathe, PA       No current outpatient prescriptions on file as of 12/15/2011.     Physical Exam: right shoulder with endstage rotator cuff tear arthropathy and painful ROIM as noted at office visit 11/04/11  Vitals  Temp:  [98 F (36.7 C)] 98 F (36.7 C) (04/18 0604) Pulse Rate:  [63] 63  (04/18 0604) Resp:  [18] 18  (04/18 0604) BP: (149)/(80) 149/80 mmHg (04/18 0604) SpO2:  [98 %] 98 % (04/18  0604)  Assessment/Plan  Impression: RIGHT SHOULDER ROTATOR CUFF TEAR ARTHROPLASTY  Plan of Action: Procedure(s): REVERSE SHOULDER ARTHROPLASTY  [name] 12/15/2011, 7:31 AM

## 2011-12-15 NOTE — Op Note (Signed)
NAMEJESSUP, [name] [name]               ACCOUNT NO.:  [id number]  MEDICAL RECORD NO.:  [id number]  LOCATION:  [facility]                         FACILITY:  [facility]  PHYSICIAN:  Vania Rea. Arelly Whittenberg, M.D.  DATE OF BIRTH:  June 26, 1923  DATE OF PROCEDURE:  12/15/2011 DATE OF DISCHARGE:                              OPERATIVE REPORT   PREOPERATIVE DIAGNOSIS:  End-stage right shoulder rotator cuff tear arthropathy.  POSTOPERATIVE DIAGNOSIS:  End-stage right shoulder rotator cuff tear arthropathy.  PROCEDURE:  Right shoulder reverse arthroplasty utilizing a cemented size 12 DePuy stem, +3 poly, and a 42 eccentric glenosphere.  SURGEON:  Vania Rea. Janeane Cozart, M.D.  Threasa HeadsFrench Ana A. Shuford, P.A.-C.  ANESTHESIA:  General endotracheal performed under awake intubation technique as well as an interscalene block.  ESTIMATED BLOOD LOSS:  250 mL.  DRAINS:  None.  HISTORY:  Mr. Perales is an 78 year old gentleman who has had chronic and progressive increasing right shoulder pain after 2 previous rotator cuff repair.  She is now evidenced for rotator cuff tear arthropathy.  Due to his ongoing pain and functional limitations, he is brought to the operating room at this time for planned right reverse shoulder arthroplasty.  Preoperatively, I counseled Mr. Hulbert on treatment options as well as risks versus benefits thereof.  Possible surgical complications were reviewed including the potential for bleeding, infection, neurovascular injury, persistent pain, loss of motion, anesthetic complication, failure of the implant, possible need for additional surgery.  He understands and accepts and agrees with our planned procedure.  PROCEDURE IN DETAIL:  After undergoing routine preop evaluation, the patient received prophylactic antibiotics and an interscalene block was established in the holding area.  The patient brought to the operating room, placed supine on the operating table and underwent  intubation utilizing an awake intubation technique due to his upper airway anatomy. Smooth induction intubation was achieved.  The patient was then placed in the beach chair position and appropriately padded and protected.  The right shoulder region was sterilely prepped and draped in standard fashion.  Time-out was called.  An anterior approach to the right shoulder was performed through a 15 cm deltopectoral incision with skin flaps elevated, electrocautery was used for hemostasis.  The deltopectoral interval was identified.  Cephalic vein retracted laterally with the deltoid and then adhesions developed beneath the deltoid and the subacromial/subdeltoid bursal region.  The conjoined tendon was then identified, mobilized, retracted medially, and self- retaining retractors were placed.  The upper centimeter of the pec major was tenotomized for visualization.  We then unroofed the bicipital groove and divided __________ to the biceps, which actually appeared to have been previously torn, but scarified in the groove.  We then dissected the subscapularis away from the lesser tuberosity and tagged the free margin for possible consideration of later repair.  We then divided the remnant of the superior rotator cuff and excised these regions.  This allowed [place] to deliver the humeral head through the wound and confirmed that there have been chronic rupture of the rotator cuff superiorly.  We then gained access into the humeral medullary canal with a series of hand reamers up to size 12.  Intramedullary guide was then placed.  We performed a humeral head resection at the appropriate level at 0 degrees retroversion using an oscillating saw.  Metal cap was then placed over the cut surface of the proximal humerus and then we exposed the glenoid with combination of Fukuda pitchfork and snake tongue retractors.  We performed a circumferential excision of the labrum allowing full exposure of the  periphery of the glenoid.  A central guidepin was then placed.  We reamed the glenoid to a subchondral bony bed and then removed peripheral bone and soft tissue of the margins with the peripheral reamer and then irrigated glenoid copiously.  A central reamer was then completed creating the central PEG hole.  We then applied the glenoid base plate and transfixed this with locking screws superiorly and inferiorly, and nonlocking screws anteriorly and posteriorly, all obtaining good bony purchase and fixation.  We then performed the final locking of the superior and inferior locking screws with fixation much to my satisfaction.  We then utilized a 42 eccentric glenosphere and passed this over the guidewire and seated across the glenoid base plate with terminal fixation much to our satisfaction.  We then redelivered the proximal humerus through the wound and placed the medullary stem and reaming guide and seated this to the appropriate depth and then used a size 1 metaphyseal reamer.  We then performed a trial reduction and found excessive soft tissue tension, so we repositioned the reaming stem slightly deeper and performed further reaming.  Once this was completed, the trial stem was inserted to the appropriate level.  We are able to gain excellent soft tissue balance with the +3 poly.  At this point, a distal cement plug was then placed in the humeral canal.  Canal was irrigated, dried, cement was mixed, and with the appropriate consistency, it was introduced into the medullary canal in retrograde fashion, and then the final size 12 stem was directed into the humeral canal to the appropriate depth impacted at 0 degrees retroversion, all extra cement was meticulously removed.  After the cement had hardened, we then performed once again trial reductions with the +3 showing a proper soft tissue balance.  We cleaned the proximal humeral stem and impacted the final +3 poly, performed a  final reduction again with good stability and excellent soft tissue balance, and good motion of the shoulder.  The wounds were copiously irrigated. I should mention we did find that the cephalic vein was incompetent and had to obtain hemostasis by ligating the vein prior to closure.  The wound was then irrigated.  Final hemostasis was obtained.  The deltopectoral interval was then reapproximated with a series of figure- of-eight #1 Vicryl sutures.  2-0 Vicryl was used for the subcu layer and intracuticular 3-0 Monocryl for the skin followed by Steri-Strips.  Dry dressings applied.  Right arm was placed in a sling.  The patient was awakened, extubated, and taken to recovery room in stable condition.     Vania Rea. Clarabell Matsuoka, M.D.     KMS/MEDQ  D:  12/15/2011  T:  12/15/2011  Job:  [id number]

## 2011-12-15 NOTE — Anesthesia Procedure Notes (Addendum)
Anesthesia Regional Block:   Narrative:    Anesthesia Regional Block:  Interscalene brachial plexus block  Pre-Anesthetic Checklist: ,, timeout performed, Correct Patient, Correct Site, Correct Laterality, Correct Procedure, Correct Position, site marked, Risks and benefits discussed,  Surgical consent,  Pre-op evaluation,  At surgeon's request and post-op pain management  Laterality: Right and Upper  Prep: chloraprep       Needles:  Injection technique: Single-shot  Needle Type: Echogenic Needle     Needle Length: 5cm 5 cm Needle Gauge: 22 and 22 G    Additional Needles:  Procedures: ultrasound guided Interscalene brachial plexus block Narrative:  Start time: 12/15/2011 7:12 AM End time: 12/15/2011 7:20 AM Injection made incrementally with aspirations every 5 mL.  Performed by: Personally  Anesthesiologist: Sheldon Silvan  Additional Notes: Marcaine 0.5% with 1:200000 Epi  25 ml  Supraclavicular block Procedure Name: Awake intubation Date/Time: 12/15/2011 8:51 AM Performed by: Jerilee Hoh Pre-anesthesia Checklist: Patient identified, Emergency Drugs available, Suction available and Patient being monitored Patient Re-evaluated:Patient Re-evaluated prior to inductionOxygen Delivery Method: Circle system utilized Preoxygenation: Pre-oxygenation with 100% oxygen Grade View: Grade III Tube type: Oral Tube size: 7.5 mm Number of attempts: 1 Airway Equipment and Method: Video-laryngoscopy and Rigid stylet Placement Confirmation: ETT inserted through vocal cords under direct vision,  positive ETCO2 and breath sounds checked- equal and bilateral Secured at: 23 cm Tube secured with: Tape Dental Injury: Teeth and Oropharynx as per pre-operative assessment  Difficulty Due To: Difficulty was anticipated, Difficult Airway- due to anterior larynx, Difficult Airway- due to limited oral opening and Difficult Airway- due to reduced neck mobility Future Recommendations: Recommend-  awake intubation Comments: Awake Glidescope intubation by Dr. Ivin Booty.  See quick note

## 2011-12-15 NOTE — Op Note (Signed)
[name] [name], [name] [name]               ACCOUNT NO.:  [id number]  MEDICAL RECORD NO.:  [id number]  LOCATION:  5018                         FACILITY:  [facility]  PHYSICIAN:  Vania Rea. Tayler Lassen, M.D.  DATE OF BIRTH:  [birth year]  DATE OF PROCEDURE:  12/15/2011 DATE OF DISCHARGE:                              OPERATIVE REPORT   ADDENDUM [name] [name], was utilized as Geophysicist/field seismologist throughout this case essential for assistance with positioning of the extremity, retractions, soft tissue management, implantation of the implant, wound closure, and intraoperative decision making.  Assist in expedient to facilitate the case.     Vania Rea. Klayton Monie, M.D.     KMS/MEDQ  D:  12/15/2011  T:  12/15/2011  Job:  [id number]

## 2011-12-15 NOTE — Progress Notes (Signed)
Pt was unable to void independently after multiple attempts. Bladder scan was performed, 433cc shown. I&O cath performed, 700cc out. Pt tolerated well and felt much relief afterwards. Tammy Sours

## 2011-12-15 NOTE — Discharge Instructions (Signed)
   Kevin M. Supple, M.D., F.A.A.O.S. Orthopaedic Surgery Specializing in Arthroscopic and Reconstructive Surgery of the Shoulder and Knee [phone] [address] - [place], [place] [address] - Fax [phone]   POST-OP TOTAL SHOULDER REPLACEMENT/SHOULDER HEMIARTHROPLASTY INSTRUCTIONS  1. Call the office at [phone] to schedule your first post-op appointment 10-14 days from the date of your surgery.  2. Leave the steri-strips in place over your incisions when performing dressing changes and showering. You may remove your dressings and begin showering on day 5 from surgery. You can expect drainage that is bloody or yellow in nature that should gradually decrease from day of surgery. Change your dressing daily until drainage is completely resolved, then you may feel free to go without a bandage. You can also expect significant bruising around your shoulder that will drift down your arm and into your chest wall. This is very normal and should resolve over several days.  3. Wear your sling/immobilizer at all times except to perform the exercises below or to occasionally let your arm dangle by your side to stretch your elbow. You also need to sleep in your sling immobilizer until instructed otherwise.  4. Range of motion to your elbow, wrist, and hand are encouraged 3-5 times daily. Exercise to your hand and fingers helps to reduce swelling you may experience.  5. Utilize ice to the shoulder 3-5 times minimum a day and additionally if you are experiencing pain.  6. Prescriptions for a pain medication and a muscle relaxant are provided for you. It is recommended that if you are experiencing pain that you pain medication alone is not controlling, add the muscle relaxant along with the pain medication which can give additional pain relief. The first 1-2 days is generally the most severe of your pain and then should gradually decrease. As your pain lessens it is recommended that you  decrease your use of the pain medications to an "as needed basis'" only and to always comply with the recommended dosages of the pain medications.  7. Pain medications can produce constipation along with their use. If you experience this, the use of an over the counter stool softener or laxative daily is recommended.   8. For most patients, if insurance allows, home health services to include therapy has been arranged.  9. For additional questions or concerns, please do not hesitate to call the office. If after hours there is an answering service to forward your concerns to the physician on call.  POST-OP EXERCISES  Pendulum Exercises  Perform pendulum exercises while standing and bending at the waist. Support your uninvolved arm on a table or chair and allow your operated arm to hang freely. Make sure to do these exercises passively - not using you shoulder muscles.  Repeat 20 times. Do 3 sessions per day.      

## 2011-12-15 NOTE — Anesthesia Postprocedure Evaluation (Signed)
  Anesthesia Post-op Note  Patient: [name] [name]  Procedure(s) Performed: Procedure(s) (LRB): REVERSE SHOULDER ARTHROPLASTY (Right)  Patient Location: PACU  Anesthesia Type: GA combined with regional for post-op pain  Level of Consciousness: awake, alert  and oriented  Airway and Oxygen Therapy: Patient Spontanous Breathing and Patient connected to nasal cannula oxygen  Post-op Pain: none  Post-op Assessment: Post-op Vital signs reviewed, Patient's Cardiovascular Status Stable, Respiratory Function Stable, Patent Airway and Pain level controlled  Post-op Vital Signs: Reviewed and stable  Complications: No apparent anesthesia complications

## 2011-12-15 NOTE — Preoperative (Signed)
Beta Blockers   Reason not to administer Beta Blockers:Not Applicable 

## 2011-12-15 NOTE — Op Note (Signed)
12/15/2011  9:50 AM  PATIENT:   [name] [name]  78 y.o. male  PRE-OPERATIVE DIAGNOSIS:  RIGHT SHOULDER ROTATOR CUFF TEAR ARTHROPATHY  POST-OPERATIVE DIAGNOSIS:  same  PROCEDURE:  Right reverse shoulder srthroplasty  SURGEON:  Treva Huyett, Vania Rea M.D.  ASSISTANTS: Shuford pac    ANESTHESIA:   GET + ISB  EBL: 250  SPECIMEN:  none  Drains: none   PATIENT DISPOSITION:  PACU - hemodynamically stable.    PLAN OF CARE: Admit to inpatient   Return to [facility]. F/U 2 weeks  Dictation# [id number]

## 2011-12-16 ENCOUNTER — Encounter (HOSPITAL_COMMUNITY): Payer: Self-pay | Admitting: Orthopedic Surgery

## 2011-12-16 MED ORDER — DIAZEPAM 2 MG PO TABS: 2.0000 mg | ORAL_TABLET | Freq: Four times a day (QID) | ORAL | Status: AC | PRN

## 2011-12-16 MED ORDER — HYDROCODONE-ACETAMINOPHEN 5-325 MG PO TABS: 1.0000 | ORAL_TABLET | ORAL | Status: AC | PRN

## 2011-12-16 MED ORDER — SODIUM CHLORIDE 0.9 % IV BOLUS (SEPSIS)
500.0000 mL | Freq: Once | INTRAVENOUS | Status: AC
  Administered 2011-12-16: 500 mL via INTRAVENOUS

## 2011-12-16 MED ORDER — HYDROCODONE-ACETAMINOPHEN 5-325 MG PO TABS
1.0000 | ORAL_TABLET | ORAL | Status: DC | PRN
  Administered 2011-12-16 – 2011-12-17 (×3): 1 via ORAL
  Filled 2011-12-16 (×3): qty 1

## 2011-12-16 MED ORDER — HYDROMORPHONE HCL 2 MG PO TABS: 2.0000 mg | ORAL_TABLET | ORAL | Status: AC | PRN

## 2011-12-16 NOTE — Progress Notes (Signed)
[name] [name]  MRN: [id number] DOB/Age: [birth year] 78 y.o. Physician: Lynnea Maizes, M.D. 1 Day Post-Op Procedure(s) (LRB): REVERSE SHOULDER ARTHROPLASTY (Right)  Subjective: Feeling better this evening. Unable to void, foley placed. Vital Signs Temp:  [98.6 F (37 C)-99.5 F (37.5 C)] 98.6 F (37 C) (04/19 1427) Pulse Rate:  [81-106] 81  (04/19 1427) Resp:  [18-20] 18  (04/19 1427) BP: (100-126)/(48-62) 126/60 mmHg (04/19 1427) SpO2:  [93 %-95 %] 95 % (04/19 1427)  Lab Results No results found for this basename: WBC:2,HGB:2,HCT:2,PLT:2 in the last 72 hours BMET No results found for this basename: NA:2,K:2,CL:2,CO2:2,GLUCOSE:2,BUN:2,CREATININE:2,CALCIUM:2 in the last 72 hours INR  Date Value Range Status  04/01/2010 1.59* 0.00-1.49 (no units) Final     Exam  Ecchymosis about right axilla, intact to light touch axillary N. Distribution, compartments soft, N/V intact distally  Plan D/C foley in am, if unable to void contact urology for recommendations: ([place]) OT to teach ADL's, transfers, pendulums [name] M 12/16/2011, 5:38 PM

## 2011-12-16 NOTE — Progress Notes (Signed)
[name] [name]  MRN: [id number] DOB/Age: 1922-12-10 78 y.o. Physician: Jacquelyne Balint Procedure: Procedure(s) (LRB): REVERSE SHOULDER ARTHROPLASTY (Right)     Subjective: Had a very difficult night. Urinary retention, severe pain and moderate nausea. Unsteady on feet getting to bathroom.  Vital Signs Temp:  [97 F (36.1 C)-99.5 F (37.5 C)] 99 F (37.2 C) (04/19 0550) Pulse Rate:  [63-106] 93  (04/19 0550) Resp:  [10-20] 20  (04/19 0550) BP: (100-146)/(48-94) 116/62 mmHg (04/19 0550) SpO2:  [93 %-100 %] 95 % (04/19 0550) FiO2 (%):  [100 %] 100 % (04/18 1256)  Lab Results No results found for this basename: WBC:2,HGB:2,HCT:2,PLT:2 in the last 72 hours BMET No results found for this basename: NA:2,K:2,CL:2,CO2:2,GLUCOSE:2,BUN:2,CREATININE:2,CALCIUM:2 in the last 72 hours INR  Date Value Range Status  04/01/2010 1.59* 0.00-1.49 (no units) Final     Exam In obvious discomfort. Awake alert. Sons in room. RUE with obvious bruising NVI. Compartments soft         Plan S/p R shoulder reverse arthroplasty  Hold dc until above issues are improved. Have oredered some different meds,they had not given him any oral meds overnight so we are behind on pain control. Discussed with RN. If [facility] will take patient over weekend possible dc tomorrow  Takia Runyon for Dr.Kevin Supple 12/16/2011, 7:57 AM

## 2011-12-16 NOTE — Progress Notes (Signed)
Pt was still unable to void after the first I&O cath around 1800 on 4/18, after multiple attempts to the [facility]. I&O repeated for 2nd time at 0130 with output of 500cc. Pt stated much relief. However pt did have difficulty ambulating, requiring 2+assist to [facility]. BP during the night remained in low 100's over high 40's-low 50's. Pt had no c/o dizziness when ambulating, but had difficulty getting started taking steps and required much assistance and encouragement. Pt assisted to BR then into recliner. Will continue to monitor pt's mobility and BP.

## 2011-12-16 NOTE — Progress Notes (Signed)
Clinical Social Work Department  BRIEF PSYCHOSOCIAL ASSESSMENT  12/16/2011  Patient: [name] [name], [name] [name] Account Number: [id number] Admit date: 12/15/2011  Clinical Social Worker: Peggyann Shoals Date/Time: 12/16/2011 03:00 PM  Referred by: Physician Date Referred: 12/16/2011   Referred for   SNF Placement   Other Referral:  Interview type: Patient  Other interview type:  PSYCHOSOCIAL DATA  Living Status: WIFE  Admitted from facility:  Level of care:  Primary support name: Mary "Watty" Pillars  Primary support relationship to patient: SPOUSE  Degree of support available:   support. Pt lives with his wife in the indepdent living portion of [facility].   CURRENT CONCERNS   Current Concerns   Post-Acute Placement   Other Concerns:  SOCIAL WORK ASSESSMENT / PLAN   CSW met with pt to address consult. Pt lives at [facility], in the independent living. PT is recommending SNF at discharge. Pt shared that he has spoken with the admissions coordinator regarding admission to SNF at [facility]. Pt also shared that [facility] would also provide transportation.   Assessment/plan status: Other - See comment  Other assessment/ plan:   CSW initated referral and left messages with [facility] regarding placement for pt. CSW will continue to follow to facilitate discharge to SNF.   Information/referral to community resources:   As needed.   PATIENT'S/FAMILY'S RESPONSE TO PLAN OF CARE:   Pt was pleasant and alert and oriented. Pt is agreeable to discharge plan.   Dede Query, MSW, [place] [facility]  [phone]

## 2011-12-16 NOTE — Progress Notes (Signed)
OT Note  Pt sleeping soundly in chair with family present.  Family reports this is first time patient has slept. Explained role of OT to this family and will re attempt OT eval later in day.  Plan is to go back to SNF at [facility] for therapy before going back to his home at [facility]. Thanks, Lise Auer, OT

## 2011-12-16 NOTE — Progress Notes (Addendum)
Clinical Social Work Department CLINICAL SOCIAL WORK PLACEMENT NOTE 12/16/2011  Patient:  CASTOR, GITTLEMAN  Account Number:  [id number] Admit date:  12/15/2011  Clinical Social Worker:  Peggyann Shoals  Date/time:  12/16/2011 03:00 PM  Clinical Social Work is seeking post-discharge placement for this patient at the following level of care:   SKILLED NURSING   (*CSW will update this form in Epic as items are completed)   12/16/2011  Patient/family provided with [facility] Department of Clinical Social Work's list of facilities offering this level of care within the geographic area requested by the patient (or if unable, by the patient's family).  12/16/2011  Patient/family informed of their freedom to choose among providers that offer the needed level of care, that participate in Medicare, Medicaid or managed care program needed by the patient, have an available bed and are willing to accept the patient.  12/16/2011  Patient/family informed of [facility]' ownership interest in [facility], as well as of the fact that they are under no obligation to receive care at this facility.  PASARR submitted to EDS on  PASARR number received from EDS on   FL2 transmitted to all facilities in geographic area requested by pt/family on  12/16/2011 FL2 transmitted to all facilities within larger geographic area on   Patient informed that his/her managed care company has contracts with or will negotiate with  certain facilities, including the following:     Patient/family informed of bed offers received:  12/17/11  Patient chooses bed at [facility] Physician recommends and patient chooses bed at  SNF  Patient to be transferred to  on  [facility] Patient to be transferred to facility by [facility] transport  The following physician request were entered in Epic:   Additional Comments:  Pt shared that he is going to [facility] SNF at discharge. [facility] does not require a  PASARR#.  Dede Query, MSW, [place] [phone]

## 2011-12-16 NOTE — Progress Notes (Signed)
UR COMPLETED  

## 2011-12-17 ENCOUNTER — Encounter (HOSPITAL_COMMUNITY): Payer: Self-pay | Admitting: *Deleted

## 2011-12-17 NOTE — Progress Notes (Signed)
Physical Therapy Evaluation Patient Details Name: [name] [name] MRN: [id number] DOB: [birth year] Today's Date: 12/17/2011 Time: 4098-1191 PT Time Calculation (min): 29 min  PT Assessment / Plan / Recommendation Clinical Impression  Pt is an 78 y/o male admitted s/p right shoulder arthroscopy along with the below PT problem list.  Pt would benefit from acute PT to maximize independence and facilitate d/c to SNF.    PT Assessment  Patient needs continued PT services    Follow Up Recommendations  Skilled nursing facility    Equipment Recommendations  Defer to next venue    Frequency Min 3X/week    Precautions / Restrictions Precautions Precautions: Shoulder Type of Shoulder Precautions: Pendulum exercises; AAROM within pain tolerance: AROM elbow, wrist, and hand, Precaution Booklet Issued: Yes (comment) Precaution Comments: handout provided by OT. Required Braces or Orthoses: Other Brace/Splint (SLING) Restrictions Weight Bearing Restrictions: Yes RUE Weight Bearing: Non weight bearing    Pertinent Vitals/Pain 2/10 in right shoulder.  Pt premedicated and repositioned with ice applied.   Mobility  Bed Mobility Bed Mobility: Supine to Sit Supine to Sit: With rails;HOB elevated;4: Min assist (HOB 45 degrees.) Details for Bed Mobility Assistance: Assist to translate trunk anterior with cues for sequence. Transfers Transfers: Sit to Stand;Stand to Sit Sit to Stand: 4: Min assist;With upper extremity assist;From bed Stand to Sit: 4: Min assist;Without upper extremity assist;To chair/3-in-1 Details for Transfer Assistance: Assist to translate trunk anterior over BOS with cues for hand placement.  Slight posterior lean with improvement since OT evaluation. Ambulation/Gait Ambulation/Gait Assistance: 4: Min assist Ambulation Distance (Feet): 10 Feet Assistive device: 1 person hand held assist Ambulation/Gait Assistance Details: Assist for balance with cues to shift weight anterior  over BOS due to slight posterior lean.  Distance limited by decreased balance and lethargy. Gait Pattern: Decreased stride length Stairs: No Wheelchair Mobility Wheelchair Mobility: No    Exercises Shoulder Exercises Pendulum Exercise:  (Pt. unable to perform due to balance deficits) Shoulder Flexion: AAROM;Right;10 reps;Supine Shoulder ABduction: AAROM;Right;10 reps;Supine Shoulder External Rotation: AAROM;Right;10 reps;Supine Elbow Flexion: AROM;10 reps;Supine;Seated;Right Elbow Extension: AROM;Right;10 reps;Supine;Seated Wrist Flexion: AROM;Right;10 reps;Seated Wrist Extension: AROM;10 reps;Seated Digit Composite Flexion: AROM;Right;10 reps;Seated Composite Extension: AROM;10 reps;Seated   PT Goals Acute Rehab PT Goals PT Goal Formulation: With patient Time For Goal Achievement: 12/31/11 Potential to Achieve Goals: Good Pt will go Supine/Side to Sit: with supervision PT Goal: Supine/Side to Sit - Progress: Goal set today Pt will go Sit to Supine/Side: with supervision PT Goal: Sit to Supine/Side - Progress: Goal set today Pt will go Sit to Stand: with supervision PT Goal: Sit to Stand - Progress: Goal set today Pt will go Stand to Sit: with supervision PT Goal: Stand to Sit - Progress: Goal set today Pt will Ambulate: 51 - 150 feet;with supervision;with least restrictive assistive device PT Goal: Ambulate - Progress: Goal set today  Visit Information  Last PT Received On: 12/17/11 Assistance Needed: +1    Subjective Data  Subjective: "I am just all off balance from the medication." Patient Stated Goal: Get well.   Prior Functioning  Home Living Lives With: Spouse (At independent living.) Available Help at Discharge: Skilled Nursing Facility Additional Comments: Pt. to discharge to SNF at [facility] Prior Function Level of Independence: Independent Able to Take Stairs?: Reciprically Driving: Yes Comments: Pt very active PTA exercising 3x/week.  No DME used with  ambulation PTA with no history of falls. Communication Communication: No difficulties Dominant Hand: Right    Cognition  Overall Cognitive Status:  Appears within functional limits for tasks assessed/performed Arousal/Alertness: Lethargic Orientation Level: Appears intact for tasks assessed Behavior During Session: [facility] for tasks performed    Extremity/Trunk Assessment Right Upper Extremity Assessment RUE ROM/Strength/Tone: Deficits RUE ROM/Strength/Tone Deficits: Shoulder AAROM ~75; abduction ~50; ER to neutral.  AROM elbow and hand WFL; supination passively and actively limited to ~40 RUE Sensation: WFL - Light Touch Left Upper Extremity Assessment LUE ROM/Strength/Tone: Within functional levels LUE Sensation: WFL - Light Touch LUE Coordination: WFL - gross/fine motor Right Lower Extremity Assessment RLE ROM/Strength/Tone: Within functional levels RLE Sensation: WFL - Light Touch RLE Coordination: WFL - gross/fine motor Left Lower Extremity Assessment LLE ROM/Strength/Tone: Within functional levels LLE Sensation: WFL - Light Touch LLE Coordination: WFL - gross/fine motor Trunk Assessment Trunk Assessment: Normal   Balance Balance Balance Assessed: Yes Static Standing Balance Static Standing - Level of Assistance: 4: Min assist  End of Session PT - End of Session Activity Tolerance: Patient tolerated treatment well Patient left: in chair;with call bell/phone within reach Nurse Communication: Mobility status   Cephus Shelling 12/17/2011, 1:45 PM  12/17/2011 Cephus Shelling, PT, DPT [phone]

## 2011-12-17 NOTE — Progress Notes (Signed)
Patient discharge to SNF/[facility] via private vehicle with son 12/17/2011 3:02 PM. All necessary paperwork sent with patient to facility. Barbera Setters

## 2011-12-17 NOTE — Progress Notes (Signed)
Subjective: 2 Days Post-Op Procedure(s) (LRB): REVERSE SHOULDER ARTHROPLASTY (Right) Patient reports pain as mild and moderate.   Patient seen in rounds with Dr. Lequita Halt. Patient with pain in shoulder.  Plan is to go back to [facility].  Checked and they will accept patient on Saturday.  Foley removed this morning around 8 am.  Has not voided yet.  History of Urinary retention.  Does has a urologist - Dr. Jerilee Field.  If able to void, will transfer over to Skilled area of [facility].  If unable to void, will keep, consult urology.  Then transfer probably on Monday.  Objective: Vital signs in last 24 hours: Temp:  [98.5 F (36.9 C)-98.6 F (37 C)] 98.5 F (36.9 C) (04/20 0627) Pulse Rate:  [81-85] 85  (04/20 0627) Resp:  [18] 18  (04/20 0627) BP: (126-150)/(60-74) 150/74 mmHg (04/20 0627) SpO2:  [95 %-97 %] 95 % (04/20 0627)  Intake/Output from previous day:  Intake/Output Summary (Last 24 hours) at 12/17/11 0936 Last data filed at 12/17/11 1610  Gross per 24 hour  Intake    240 ml  Output   1700 ml  Net  -1460 ml    Intake/Output this shift:    Labs: No results found for this basename: HGB:5 in the last 72 hours No results found for this basename: WBC:2,RBC:2,HCT:2,PLT:2 in the last 72 hours No results found for this basename: NA:2,K:2,CL:2,CO2:2,BUN:2,CREATININE:2,GLUCOSE:2,CALCIUM:2 in the last 72 hours No results found for this basename: LABPT:2,INR:2 in the last 72 hours  Exam: Neurovascular intact Sensation intact distally Incision - clean, dry, no drainage, healing Motor function intact - moving hand and fingers well on exam.   Assessment/Plan: 2 Days Post-Op Procedure(s) (LRB): REVERSE SHOULDER ARTHROPLASTY (Right) Procedure(s) (LRB): REVERSE SHOULDER ARTHROPLASTY (Right) Past Medical History  Diagnosis Date  . Hypertension   . Arthritis   . Difficult intubation 1994    surgery had to be  stopped due to injury to "throat"  . Difficult intubation 1994      no trouble since.  Required nasotracheal intubation  '02 [facility]    Principal Problem:  *Arthritis of shoulder region, right   Up with therapy Discharge to SNF if able to void Diet - heart healthy Follow up - in 2 weeks Activity - up as tolerated.  Sling for right arm. Disposition - Skilled nursing facility Condition Upon Discharge - Stable D/C Meds - See DC Summary COD - Pending at this time.  Will see if continues to improve and able to void.  Patrica Duel 12/17/2011, 9:36 AM

## 2011-12-17 NOTE — Progress Notes (Signed)
Pt to transfer to [facility] today via family transport. Pt and pt's son verbalized understanding of risk to pt with family transport. No other CSW needs identified. CSW signing off. Dellie Burns, MSW, [place] [phone] (weekend)

## 2011-12-17 NOTE — Evaluation (Signed)
Occupational Therapy Evaluation Patient Details Name: [name] [name] MRN: [id number] DOB: [birth year] Today's Date: 12/17/2011 Time: 4098-1191 OT Time Calculation (min): 51 min  OT Assessment / Plan / Recommendation Clinical Impression  This 78 y.o. male admitted for reverse TSA.  Pt. was instructed in and performed AAROM Rt. shoulder, and AROM elbow distally.  Pt. demonstrates significant balance deficits in standing - pt attributes this to medications.  Currently, pt requires min-mod A for static standing.  Pendulums not performed this date due to severity of balance deficits.  Pt. continues with intermittent lethargy.  Pt. will benefit from OT to maximize safety and independence with BADLs to allow pt. to return to modified independent level.  Recommend continued OT at [facility]    OT Assessment  Patient needs continued OT Services    Follow Up Recommendations  Skilled nursing facility;Supervision/Assistance - 24 hour    Equipment Recommendations  Defer to next venue    Frequency Min 2X/week    Precautions / Restrictions Precautions Precautions: Shoulder Type of Shoulder Precautions: Pendulum exercises; AAROM within pain tolerance: AROM elbow, wrist, and hand, Precaution Booklet Issued: Yes (comment) Precaution Comments: handout provided Required Braces or Orthoses: Other Brace/Splint (SLING) Restrictions Weight Bearing Restrictions: Yes RUE Weight Bearing: Non weight bearing   Pertinent Vitals/Pain     ADL  Eating/Feeding: Simulated;Set up Where Assessed - Eating/Feeding: Bed level Grooming: Simulated;Wash/dry hands;Wash/dry face;Teeth care;Set up Where Assessed - Grooming: Supine, head of bed up Upper Body Bathing: Moderate assistance Where Assessed - Upper Body Bathing: Supine, head of bed up Lower Body Bathing: Maximal assistance Where Assessed - Lower Body Bathing: Sit to stand from bed Upper Body Dressing: Simulated;Maximal assistance Where Assessed - Upper Body  Dressing: Sitting, bed Lower Body Dressing: Simulated;+1 Total assistance Where Assessed - Lower Body Dressing: Sit to stand from bed ADL Comments: Pt. instructed in AAROM Lt. shoulder, and AROM elbow, wrist and hand.      OT Goals Acute Rehab OT Goals OT Goal Formulation: With patient Time For Goal Achievement: 12/24/11 Potential to Achieve Goals: Good ADL Goals Pt Will Perform Grooming:  (min guard assist) Pt Will Perform Upper Body Bathing: Standing at sink ADL Goal: Upper Body Bathing - Progress: Goal set today Pt Will Perform Lower Body Bathing: with min assist;Sitting, edge of bed ADL Goal: Lower Body Bathing - Progress: Goal set today Pt Will Perform Upper Body Dressing: with min assist;Sitting, bed ADL Goal: Upper Body Dressing - Progress: Goal set today Pt Will Perform Lower Body Dressing: with min assist;Sit to stand from bed ADL Goal: Lower Body Dressing - Progress: Goal set today Pt Will Transfer to Toilet: Ambulation;Comfort height toilet (min guard assist) ADL Goal: Toilet Transfer - Progress: Goal set today Pt Will Perform Toileting - Clothing Manipulation: Standing (min guard assist) ADL Goal: Toileting - Clothing Manipulation - Progress: Goal set today Additional ADL Goal #1: Pt. will perform pendulum exercises with min A ADL Goal: Additional Goal #1 - Progress: Goal set today Additional ADL Goal #2: Pt. will tolerate AAROM Lt. shoulder with pain no greater than 4/10 ADL Goal: Additional Goal #2 - Progress: Goal set today  Visit Information  Last OT Received On: 12/17/11 Assistance Needed: +1    Subjective Data  Subjective: "I've been really groggy" Patient Stated Goal: "to get better"   Prior Functioning  Home Living Lives With: Spouse Available Help at Discharge: Skilled Nursing Facility Additional Comments: Pt. to discharge to SNF at [facility] Prior Function Level of Independence: Independent Able to  Take Stairs?: Reciprically Driving: Yes Comments:  Pt. reports he was very active PTA, exercises 3x/wk.  No history of falls.  Does not use DME for ambulation Communication Communication: No difficulties Dominant Hand: Right    Cognition  Overall Cognitive Status: Appears within functional limits for tasks assessed/performed Arousal/Alertness: Lethargic Orientation Level: Appears intact for tasks assessed Behavior During Session: [facility] for tasks performed    Extremity/Trunk Assessment Right Upper Extremity Assessment RUE ROM/Strength/Tone: Deficits RUE ROM/Strength/Tone Deficits: Shoulder AAROM ~75; abduction ~50; ER to neutral.  AROM elbow and hand WFL; supination passively and actively limited to ~40 RUE Sensation: WFL - Light Touch Left Upper Extremity Assessment LUE ROM/Strength/Tone: Within functional levels LUE Sensation: WFL - Light Touch LUE Coordination: WFL - gross/fine motor   Mobility Bed Mobility Bed Mobility: Supine to Sit Supine to Sit: 3: Mod assist;With rails;HOB elevated (40) Details for Bed Mobility Assistance: Assist for shoulders Transfers Transfers: Sit to Stand;Stand to Sit Sit to Stand: 3: Mod assist;From bed;With upper extremity assist Stand to Sit: 3: Mod assist;Without upper extremity assist;To bed Details for Transfer Assistance: Pt. demonstrates posterior bias.  Rocks back on heels.  Standing balance fluctuated between min - mod A.     Exercise Shoulder Exercises Pendulum Exercise:  (Pt. unable to perform due to balance deficits) Shoulder Flexion: AAROM;Right;10 reps;Supine Shoulder ABduction: AAROM;Right;10 reps;Supine Shoulder External Rotation: AAROM;Right;10 reps;Supine Elbow Flexion: AROM;10 reps;Supine;Seated;Right Elbow Extension: AROM;Right;10 reps;Supine;Seated Wrist Flexion: AROM;Right;10 reps;Seated Wrist Extension: AROM;10 reps;Seated Digit Composite Flexion: AROM;Right;10 reps;Seated Composite Extension: AROM;10 reps;Seated  Balance Balance Balance Assessed: Yes Static Standing  Balance Static Standing - Level of Assistance: 3: Mod assist  End of Session OT - End of Session Equipment Utilized During Treatment: Other (comment) (sling) Activity Tolerance: Patient limited by fatigue Patient left: in bed;with family/visitor present;with call bell/phone within reach Nurse Communication: Mobility status   Ericberto Padget, Ursula Alert M 12/17/2011, 12:53 PM

## 2011-12-17 NOTE — Discharge Summary (Signed)
Physician Discharge Summary   Patient ID: [name] [name] MRN: [id number] DOB/AGE: 05/14/1923 78 y.o.  Admit date: 12/15/2011 Discharge date: 12/17/2011  Primary Diagnosis: RIGHT SHOULDER ROTATOR CUFF TEAR ARTHROPLASTY  Admission Diagnoses:  Past Medical History  Diagnosis Date  . Hypertension   . Arthritis   . Difficult intubation 1994    surgery had to be  stopped due to injury to "throat"  . Difficult intubation 1994    no trouble since.  Required nasotracheal intubation  [facility]    Discharge Diagnoses:   Principal Problem:  *Arthritis of shoulder region, right  Procedure:  Procedure(s) (LRB): REVERSE SHOULDER ARTHROPLASTY (Right)   Consults: None  HPI: [name] [name] is a 78 year old gentleman who has had chronic and progressive increasing right shoulder pain after 2 previous rotator cuff repair. She is now evidenced for rotator cuff tear arthropathy. Due to his ongoing pain and functional limitations, he is brought to the operating room at this time for planned right reverse shoulder arthroplasty.  Laboratory Data: Hospital Outpatient Visit on 12/08/2011  Component Date Value Range Status  . Sodium (mEq/L) 12/08/2011 138  135-145 Final  . Potassium (mEq/L) 12/08/2011 4.4  3.5-5.1 Final  . Chloride (mEq/L) 12/08/2011 103  96-112 Final  . CO2 (mEq/L) 12/08/2011 28  19-32 Final  . Glucose, Bld (mg/dL) 40/98/1191 97  47-82 Final  . BUN (mg/dL) 95/62/1308 34* 6-57 Final  . Creatinine, Ser (mg/dL) 84/69/6295 2.84* 1.32-4.40 Final  . Calcium (mg/dL) 06/25/2535 9.7  6.4-40.3 Final  . GFR calc non Af Amer (mL/min) 12/08/2011 44* >90 Final  . GFR calc Af Amer (mL/min) 12/08/2011 50* >90 Final   Comment:                                 The eGFR has been calculated                          using the CKD EPI equation.                          This calculation has not been                          validated in all clinical                          situations.        eGFR's persistently                          <90 mL/min signify                          possible Chronic Kidney Disease.  . WBC (K/uL) 12/08/2011 10.4  4.0-10.5 Final  . RBC (MIL/uL) 12/08/2011 4.12* 4.22-5.81 Final  . Hemoglobin (g/dL) 47/42/5956 38.7  56.4-33.2 Final  . HCT (%) 12/08/2011 41.3  39.0-52.0 Final  . MCV (fL) 12/08/2011 100.2* 78.0-100.0 Final  . MCH (pg) 12/08/2011 33.5  26.0-34.0 Final  . MCHC (g/dL) 95/18/8416 60.6  30.1-60.1 Final  . RDW (%) 12/08/2011 13.7  11.5-15.5 Final  . Platelets (K/uL) 12/08/2011 202  150-400 Final  . MRSA, PCR  12/08/2011 NEGATIVE  NEGATIVE Final  . Staphylococcus aureus  12/08/2011 NEGATIVE  NEGATIVE  Final   Comment:                                 The Xpert SA Assay (FDA                          approved for NASAL specimens                          only), is one component of                          a comprehensive surveillance                          program.  It is not intended                          to diagnose infection nor to                          guide or monitor treatment.   No results found for this basename: HGB:5 in the last 72 hours No results found for this basename: WBC:2,RBC:2,HCT:2,PLT:2 in the last 72 hours No results found for this basename: NA:2,K:2,CL:2,CO2:2,BUN:2,CREATININE:2,GLUCOSE:2,CALCIUM:2 in the last 72 hours No results found for this basename: LABPT:2,INR:2 in the last 72 hours  X-Rays:Dg Chest 2 View  12/08/2011  *RADIOLOGY REPORT*  Clinical Data: Preop  CHEST - 2 VIEW  Comparison: 08/01/2008  Findings: Cardiomediastinal silhouette is stable.  No acute infiltrate or pulmonary edema.  Mild degenerative changes thoracic spine.  Mild right basilar atelectasis or scarring.  IMPRESSION: No active disease.  No significant change.  Original Report Authenticated By: Natasha Mead, M.D.    EKG: Orders placed during the hospital encounter of 12/08/11  . EKG 12-LEAD  . EKG 12-LEAD     Hospital Course:  Patient was admitted to [facility] and taken to the OR and underwent the above state procedure without complications.  Patient tolerated the procedure well and was later transferred to the recovery room and then to the orthopaedic floor for postoperative care.  They were given PO and IV analgesics for pain control following their surgery.  They were given 24 hours of postoperative antibiotics.   PT and OT were ordered therapy and shoulder protocol.  The patient was allowed to be up as tolerated with therapy. Discharge planning was consulted to help with postop disposition and equipment needs. The patient planned on going back to [facility]. Patient had a decent night on the evening of surgery and started to get up OOB with therapy on day one. Continued to work with therapy into day two.  Dressing was changed on day two and the incision was healing well.  The foley was removed and the patient was able to void.  [facility] was contacted and they were able to accept the patient on the weekend. Patient was seen in rounds and was ready to [facility].  Discharge Medications: Prior to Admission medications   Medication Sig Start Date End Date Taking? Authorizing Provider  cyanocobalamin (,VITAMIN B-12,) 1000 MCG/ML injection Inject 1,000 mcg into the muscle every 30 (thirty) days.   Yes Historical Provider, MD  doxycycline (ADOXA) 50 MG tablet Take 50 mg by mouth  daily.   Yes Historical Provider, MD  finasteride (PROSCAR) 5 MG tablet Take 5 mg by mouth daily.   Yes Historical Provider, MD  levothyroxine (SYNTHROID, LEVOTHROID) 88 MCG tablet Take 88 mcg by mouth daily.   Yes Historical Provider, MD  olmesartan (BENICAR) 40 MG tablet Take 40 mg by mouth daily.   Yes Historical Provider, MD  Polyethyl Glycol-Propyl Glycol (SYSTANE OP) Apply 1 drop to eye 4 (four) times daily as needed. For eye dryness   Yes Historical Provider, MD  pravastatin (PRAVACHOL) 80 MG tablet Take 80 mg by mouth daily.   Yes  Historical Provider, MD  prednisoLONE 5 MG TABS Take 2.5-5 mg by mouth. 5 mg in the morning & 76 mg at night   Yes Historical Provider, MD  diazepam (VALIUM) 2 MG tablet Take 1 tablet (2 mg total) by mouth every 6 (six) hours as needed for anxiety. 12/16/11 12/26/11  Ralene Bathe, PA  Flaxseed MISC 15 mLs by Does not apply route daily.    Historical Provider, MD  HYDROcodone-acetaminophen (NORCO) 5-325 MG per tablet Take 1-2 tablets by mouth every 4 (four) hours as needed for pain. 12/16/11 12/26/11  Ralene Bathe, PA  HYDROmorphone (DILAUDID) 2 MG tablet Take 1-2 tablets (2-4 mg total) by mouth every 4 (four) hours as needed for pain (for severe pain). 12/16/11 12/26/11  French Ana Shuford, PA  TESTOSTERONE IM Inject 0.15 mLs into the muscle every 7 (seven) days. On Fridays    Historical Provider, MD  VITAMIN D, ERGOCALCIFEROL, PO Take 1 tablet by mouth daily.    Historical Provider, MD    Diet: heart healthy Activity: up as tolerated. Sling for right arm. Follow-up:in 2 weeks Disposition - Skilled nursing facility Discharged Condition: good    Medication List  As of 12/17/2011  2:39 PM   TAKE these medications         cyanocobalamin 1000 MCG/ML injection   Commonly known as: (VITAMIN B-12)   Inject 1,000 mcg into the muscle every 30 (thirty) days.      diazepam 2 MG tablet   Commonly known as: VALIUM   Take 1 tablet (2 mg total) by mouth every 6 (six) hours as needed for anxiety.      doxycycline 50 MG tablet   Commonly known as: ADOXA   Take 50 mg by mouth daily.      finasteride 5 MG tablet   Commonly known as: PROSCAR   Take 5 mg by mouth daily.      Flaxseed Misc   15 mLs by Does not apply route daily.      HYDROcodone-acetaminophen 5-325 MG per tablet   Commonly known as: NORCO   Take 1-2 tablets by mouth every 4 (four) hours as needed for pain.      HYDROmorphone 2 MG tablet   Commonly known as: DILAUDID   Take 1-2 tablets (2-4 mg total) by mouth every 4 (four) hours as  needed for pain (for severe pain).      levothyroxine 88 MCG tablet   Commonly known as: SYNTHROID, LEVOTHROID   Take 88 mcg by mouth daily.      olmesartan 40 MG tablet   Commonly known as: BENICAR   Take 40 mg by mouth daily.      pravastatin 80 MG tablet   Commonly known as: PRAVACHOL   Take 80 mg by mouth daily.      prednisoLONE 5 MG Tabs   Take 2.5-5 mg by mouth. 5 mg in the  morning & 2.5 mg at night      SYSTANE OP   Apply 1 drop to eye 4 (four) times daily as needed. For eye dryness      TESTOSTERONE IM   Inject 0.15 mLs into the muscle every 7 (seven) days. On Fridays      VITAMIN D (ERGOCALCIFEROL) PO   Take 1 tablet by mouth daily.           Follow-up Information    Follow up with Senaida Lange, MD. (call to be seen 10-14 days from surgery)    Contact information:   [facility] [address] [place] [address] [phone]          Signed: Patrica Duel 12/17/2011, 2:39 PM

## 2012-05-10 ENCOUNTER — Other Ambulatory Visit: Payer: Self-pay | Admitting: Dermatology

## 2012-08-29 DIAGNOSIS — K529 Noninfective gastroenteritis and colitis, unspecified: Secondary | ICD-10-CM

## 2012-08-29 HISTORY — DX: Noninfective gastroenteritis and colitis, unspecified: K52.9

## 2012-09-21 ENCOUNTER — Other Ambulatory Visit: Payer: Self-pay | Admitting: Dermatology

## 2013-06-19 DIAGNOSIS — H401190 Primary open-angle glaucoma, unspecified eye, stage unspecified: Secondary | ICD-10-CM | POA: Insufficient documentation

## 2013-08-06 DIAGNOSIS — H02889 Meibomian gland dysfunction of unspecified eye, unspecified eyelid: Secondary | ICD-10-CM | POA: Insufficient documentation

## 2013-09-24 ENCOUNTER — Emergency Department (HOSPITAL_COMMUNITY): Payer: Medicare Other

## 2013-09-24 ENCOUNTER — Encounter (HOSPITAL_COMMUNITY): Payer: Self-pay | Admitting: Emergency Medicine

## 2013-09-24 ENCOUNTER — Emergency Department (HOSPITAL_COMMUNITY)
Admission: EM | Admit: 2013-09-24 | Discharge: 2013-09-24 | Disposition: A | Discharge status: Home / Self Care | Payer: Medicare Other | Attending: Emergency Medicine | Admitting: Emergency Medicine

## 2013-09-24 DIAGNOSIS — K529 Noninfective gastroenteritis and colitis, unspecified: Secondary | ICD-10-CM

## 2013-09-24 DIAGNOSIS — Z792 Long term (current) use of antibiotics: Secondary | ICD-10-CM | POA: Insufficient documentation

## 2013-09-24 DIAGNOSIS — R630 Anorexia: Secondary | ICD-10-CM | POA: Insufficient documentation

## 2013-09-24 DIAGNOSIS — K5289 Other specified noninfective gastroenteritis and colitis: Secondary | ICD-10-CM | POA: Insufficient documentation

## 2013-09-24 DIAGNOSIS — M129 Arthropathy, unspecified: Secondary | ICD-10-CM | POA: Insufficient documentation

## 2013-09-24 DIAGNOSIS — R634 Abnormal weight loss: Secondary | ICD-10-CM | POA: Insufficient documentation

## 2013-09-24 DIAGNOSIS — K625 Hemorrhage of anus and rectum: Secondary | ICD-10-CM | POA: Insufficient documentation

## 2013-09-24 DIAGNOSIS — R5383 Other fatigue: Secondary | ICD-10-CM

## 2013-09-24 DIAGNOSIS — R5381 Other malaise: Secondary | ICD-10-CM | POA: Insufficient documentation

## 2013-09-24 DIAGNOSIS — Z79899 Other long term (current) drug therapy: Secondary | ICD-10-CM | POA: Insufficient documentation

## 2013-09-24 DIAGNOSIS — Z9089 Acquired absence of other organs: Secondary | ICD-10-CM | POA: Insufficient documentation

## 2013-09-24 DIAGNOSIS — I10 Essential (primary) hypertension: Secondary | ICD-10-CM | POA: Insufficient documentation

## 2013-09-24 LAB — COMPREHENSIVE METABOLIC PANEL
ALBUMIN: 3.3 g/dL — AB (ref 3.5–5.2)
ALK PHOS: 53 U/L (ref 39–117)
ALT: 15 U/L (ref 0–53)
AST: 17 U/L (ref 0–37)
BILIRUBIN TOTAL: 0.9 mg/dL (ref 0.3–1.2)
BUN: 24 mg/dL — ABNORMAL HIGH (ref 6–23)
CHLORIDE: 98 meq/L (ref 96–112)
CO2: 27 mEq/L (ref 19–32)
Calcium: 9.4 mg/dL (ref 8.4–10.5)
Creatinine, Ser: 1.35 mg/dL (ref 0.50–1.35)
GFR calc Af Amer: 52 mL/min — ABNORMAL LOW (ref >90)
GFR calc non Af Amer: 45 mL/min — ABNORMAL LOW (ref >90)
Glucose, Bld: 78 mg/dL (ref 70–99)
POTASSIUM: 3.9 meq/L (ref 3.7–5.3)
SODIUM: 135 meq/L — AB (ref 137–147)
TOTAL PROTEIN: 6.1 g/dL (ref 6.0–8.3)

## 2013-09-24 LAB — CBC WITH DIFFERENTIAL/PLATELET
BASOS ABS: 0 10*3/uL (ref 0.0–0.1)
Basophils Relative: 0 % (ref 0–1)
EOS ABS: 0.2 10*3/uL (ref 0.0–0.7)
Eosinophils Relative: 1 % (ref 0–5)
HCT: 39.2 % (ref 39.0–52.0)
HEMOGLOBIN: 13.4 g/dL (ref 13.0–17.0)
LYMPHS PCT: 19 % (ref 12–46)
Lymphs Abs: 3.5 10*3/uL (ref 0.7–4.0)
MCH: 32.9 pg (ref 26.0–34.0)
MCHC: 34.2 g/dL (ref 30.0–36.0)
MCV: 96.3 fL (ref 78.0–100.0)
MONOS PCT: 7 % (ref 3–12)
Monocytes Absolute: 1.3 10*3/uL — ABNORMAL HIGH (ref 0.1–1.0)
NEUTROS PCT: 73 % (ref 43–77)
Neutro Abs: 13.5 10*3/uL — ABNORMAL HIGH (ref 1.7–7.7)
Platelets: 227 10*3/uL (ref 150–400)
RBC: 4.07 MIL/uL — AB (ref 4.22–5.81)
RDW: 14 % (ref 11.5–15.5)
WBC: 18.5 10*3/uL — ABNORMAL HIGH (ref 4.0–10.5)

## 2013-09-24 LAB — LIPASE, BLOOD: LIPASE: 50 U/L (ref 11–59)

## 2013-09-24 MED ORDER — IOHEXOL 300 MG/ML  SOLN
50.0000 mL | Freq: Once | INTRAMUSCULAR | Status: AC | PRN
  Administered 2013-09-24: 50 mL via ORAL

## 2013-09-24 MED ORDER — SODIUM CHLORIDE 0.9 % IV SOLN
1000.0000 mL | Freq: Once | INTRAVENOUS | Status: AC
  Administered 2013-09-24: 1000 mL via INTRAVENOUS

## 2013-09-24 MED ORDER — CIPROFLOXACIN HCL 500 MG PO TABS
500.0000 mg | ORAL_TABLET | Freq: Once | ORAL | Status: AC
  Administered 2013-09-24: 500 mg via ORAL
  Filled 2013-09-24: qty 1

## 2013-09-24 MED ORDER — METRONIDAZOLE 500 MG PO TABS: 500.0000 mg | ORAL_TABLET | Freq: Two times a day (BID) | ORAL | Status: DC

## 2013-09-24 MED ORDER — ONDANSETRON HCL 4 MG/2ML IJ SOLN
4.0000 mg | Freq: Once | INTRAMUSCULAR | Status: AC
  Administered 2013-09-24: 4 mg via INTRAVENOUS
  Filled 2013-09-24: qty 2

## 2013-09-24 MED ORDER — METRONIDAZOLE 500 MG PO TABS
500.0000 mg | ORAL_TABLET | Freq: Once | ORAL | Status: AC
  Administered 2013-09-24: 500 mg via ORAL
  Filled 2013-09-24: qty 1

## 2013-09-24 MED ORDER — IOHEXOL 300 MG/ML  SOLN
80.0000 mL | Freq: Once | INTRAMUSCULAR | Status: AC | PRN
  Administered 2013-09-24: 80 mL via INTRAVENOUS

## 2013-09-24 MED ORDER — SODIUM CHLORIDE 0.9 % IV SOLN
1000.0000 mL | INTRAVENOUS | Status: DC
  Administered 2013-09-24: 1000 mL via INTRAVENOUS

## 2013-09-24 MED ORDER — CIPROFLOXACIN HCL 500 MG PO TABS: 500.0000 mg | ORAL_TABLET | Freq: Two times a day (BID) | ORAL | Status: DC

## 2013-09-24 MED ORDER — ONDANSETRON 8 MG PO TBDP: 8.0000 mg | ORAL_TABLET | Freq: Three times a day (TID) | ORAL | Status: DC | PRN

## 2013-09-24 NOTE — ED Notes (Signed)
Per EMS pt comes from [facility] independent living facility c/o weakness over past couple of days and loss of appetite. Today pt vomited 3 times around 8am this morning and strained when he had BM and had some bright red blood in his stool after he wiped blood went away. Pt denies hemorrhoids or any pain but pt had "colicky pain yesterday".  Pt normal avtive with exercising 3 times a week and volunteers.

## 2013-09-24 NOTE — Discharge Instructions (Signed)
Colitis °Colitis is inflammation of the colon. Colitis can be a short-term or long-standing (chronic) illness. Crohn's disease and ulcerative colitis are 2 types of colitis which are chronic. They usually require lifelong treatment. °CAUSES  °There are many different causes of colitis, including: °· Viruses. °· Germs (bacteria). °· Medicine reactions. °SYMPTOMS  °· Diarrhea. °· Intestinal bleeding. °· Pain. °· Fever. °· Throwing up (vomiting). °· Tiredness (fatigue). °· Weight loss. °· Bowel blockage. °DIAGNOSIS  °The diagnosis of colitis is based on examination and stool or blood tests. X-rays, CT scan, and colonoscopy may also be needed. °TREATMENT  °Treatment may include: °· Fluids given through the vein (intravenously). °· Bowel rest (nothing to eat or drink for a period of time). °· Medicine for pain and diarrhea. °· Medicines (antibiotics) that kill germs. °· Cortisone medicines. °· Surgery. °HOME CARE INSTRUCTIONS  °· Get plenty of rest. °· Drink enough water and fluids to keep your urine clear or pale yellow. °· Eat a well-balanced diet. °· Call your caregiver for follow-up as recommended. °SEEK IMMEDIATE MEDICAL CARE IF:  °· You develop chills. °· You have an oral temperature above 102° F (38.9° C), not controlled by medicine. °· You have extreme weakness, fainting, or dehydration. °· You have repeated vomiting. °· You develop severe belly (abdominal) pain or are passing bloody or tarry stools. °MAKE SURE YOU:  °· Understand these instructions. °· Will watch your condition. °· Will get help right away if you are not doing well or get worse. °Document Released: 09/22/2004 Document Revised: 11/07/2011 Document Reviewed: 12/18/2009 °[employer]® Patient Information ©2014 [employer]. ° °

## 2013-09-24 NOTE — ED Provider Notes (Signed)
CSN: [id number]     Arrival date & time 09/24/13  1007 History   First MD Initiated Contact with Patient 09/24/13 1013     Chief Complaint  Patient presents with  . Weakness  . Anorexia  . Vomiting  . Rectal Bleeding    HPI Emergency department because of increasing generalized weakness and anorexia as well as weight loss over the past one to 2 weeks.  This morning he had nausea and vomiting.  He states he vomited multiple times.  No diarrhea.  He states he had several episodes of bright red blood in his stool after he wiped this morning.  He has had no bloody bowel movement since then.  He reports some crampy left-sided abdominal pain.  He states his been back to exercising rarely.  He denies chest pain shortness breath.  Is not on anticoagulants.  No fevers or chills.  No history of diverticulitis.  His last colonoscopy was in 2001.  He has not had a colonoscopy since then.  He's never had symptoms like this before.  He states his pain and discomfort is mild at this time but he does not want any medication for pain.   Past Medical History  Diagnosis Date  . Hypertension   . Arthritis   . Difficult intubation 1994    surgery had to be  stopped due to injury to "throat"  . Difficult intubation 1994    no trouble since.  Required nasotracheal intubation  [facility]    Past Surgical History  Procedure Laterality Date  . Tonsillectomy    . Eye surgery  over last 6 yrs.      trebeculectomy...   . Eye surgery   cat ext ou  . Brain surgery  2000    pituatary gland removed .  Marland Kitchen Joint replacement  2011    knee left  . Cholecystectomy  80's  . Reverse shoulder arthroplasty  12/15/2011    Procedure: REVERSE SHOULDER ARTHROPLASTY;  Surgeon: Marin Shutter, MD;  Location: [place];  Service: Orthopedics;  Laterality: Right;  right total reverse shoulder   Family History  Problem Relation Age of Onset  . Anesthesia problems Neg Hx   . Hypotension Neg Hx   . Malignant hyperthermia Neg Hx   .  Pseudochol deficiency Neg Hx    History  Substance Use Topics  . Smoking status: Never Smoker   . Smokeless tobacco: Never Used  . Alcohol Use: 4.2 oz/week    7 Shots of liquor per week    Review of Systems  All other systems reviewed and are negative.    Allergies  Percocet  Home Medications   Current Outpatient Rx  Name  Route  Sig  Dispense  Refill  . co-enzyme Q-10 30 MG capsule   Oral   Take 30 mg by mouth daily.         . cyanocobalamin (,VITAMIN B-12,) 1000 MCG/ML injection   Intramuscular   Inject 1,000 mcg into the muscle every 30 (thirty) days.         Marland Kitchen doxycycline (ADOXA) 50 MG tablet   Oral   Take 50 mg by mouth daily.         . finasteride (PROSCAR) 5 MG tablet   Oral   Take 5 mg by mouth daily.         . Flaxseed MISC   Does not apply   15 mLs by Does not apply route daily.         Marland Kitchen  levothyroxine (SYNTHROID, LEVOTHROID) 88 MCG tablet   Oral   Take 88 mcg by mouth daily.         . Multiple Vitamins-Minerals (MULTIVITAMINS THER. W/MINERALS) TABS tablet   Oral   Take 1 tablet by mouth daily.         Marland Kitchen olmesartan (BENICAR) 40 MG tablet   Oral   Take 40 mg by mouth daily.         . Omega-3 Fatty Acids (FISH OIL) 306 MG CAPS   Oral   Take 1 capsule by mouth daily.         Vladimir Faster Glycol-Propyl Glycol (SYSTANE OP)   Ophthalmic   Apply 1 drop to eye 4 (four) times daily as needed. For eye dryness         . pravastatin (PRAVACHOL) 80 MG tablet   Oral   Take 80 mg by mouth daily.         . prednisoLONE 5 MG TABS   Oral   Take 2.5-5 mg by mouth. 5 mg in the morning & 2.5 mg at night         . TESTOSTERONE IM   Intramuscular   Inject 0.15 mLs into the muscle every 7 (seven) days. On Fridays         . VITAMIN D, ERGOCALCIFEROL, PO   Oral   Take 1 tablet by mouth daily.         . ciprofloxacin (CIPRO) 500 MG tablet   Oral   Take 1 tablet (500 mg total) by mouth 2 (two) times daily.   14 tablet   0    . metroNIDAZOLE (FLAGYL) 500 MG tablet   Oral   Take 1 tablet (500 mg total) by mouth 2 (two) times daily.   14 tablet   0   . ondansetron (ZOFRAN ODT) 8 MG disintegrating tablet   Oral   Take 1 tablet (8 mg total) by mouth every 8 (eight) hours as needed for nausea or vomiting.   10 tablet   0    BP 133/71  Pulse 77  Temp(Src) 97.3 F (36.3 C) (Oral)  Resp 16  SpO2 96% Physical Exam  Nursing note and vitals reviewed. Constitutional: He is oriented to person, place, and time. He appears well-developed and well-nourished.  HENT:  Head: Normocephalic and atraumatic.  Eyes: EOM are normal.  Neck: Normal range of motion.  Cardiovascular: Normal rate, regular rhythm, normal heart sounds and intact distal pulses.   Pulmonary/Chest: Effort normal and breath sounds normal. No respiratory distress.  Abdominal: Soft. He exhibits no distension.  Mild left-sided abdominal tenderness without guarding or rebound  Musculoskeletal: Normal range of motion.  Neurological: He is alert and oriented to person, place, and time.  Skin: Skin is warm and dry.  Psychiatric: He has a normal mood and affect. Judgment normal.    ED Course  Procedures (including critical care time) Labs Review Labs Reviewed  CBC WITH DIFFERENTIAL - Abnormal; Notable for the following:    WBC 18.5 (*)    RBC 4.07 (*)    Neutro Abs 13.5 (*)    Monocytes Absolute 1.3 (*)    All other components within normal limits  COMPREHENSIVE METABOLIC PANEL - Abnormal; Notable for the following:    Sodium 135 (*)    BUN 24 (*)    Albumin 3.3 (*)    GFR calc non Af Amer 45 (*)    GFR calc Af Amer 52 (*)    All  other components within normal limits  LIPASE, BLOOD   Imaging Review Ct Abdomen Pelvis W Contrast  09/24/2013   CLINICAL DATA:  Weakness, anorexia, vomiting, rectal bleeding, and left-sided abdominal pain.  EXAM: CT ABDOMEN AND PELVIS WITH CONTRAST  TECHNIQUE: Multidetector CT imaging of the abdomen and pelvis  was performed using the standard protocol following bolus administration of intravenous contrast.  CONTRAST:  50mL OMNIPAQUE IOHEXOL 300 MG/ML SOLN, 7mL OMNIPAQUE IOHEXOL 300 MG/ML SOLN  COMPARISON:  None.  FINDINGS: Subsegmental atelectasis is present in the lung bases. There is no pleural effusion.  A punctate calcification is present in the right hepatic lobe. The liver is otherwise unremarkable. The gallbladder is surgically absent. Sequelae of prior right nephrectomy are identified. Right adrenal gland is obscured by adjacent clips. No soft tissue mass is present in the right renal fossa. The left adrenal gland and pancreas are unremarkable. 4.1 cm low-density lower pole left renal lesion is consistent with a cyst. Two 5 mm lesions within the interpolar left kidney are too small to characterize.  There is a small sliding hiatal hernia. There is circumferential wall thickening involving the splenic flexure with adjacent fat stranding. The remainder of the colon is unremarkable. No significant diverticulosis is seen. The appendix is identified in the right lower quadrant and is unremarkable. Small bowel is grossly unremarkable. There is no bowel dilatation.  Prostate is enlarged. No free fluid or enlarged lymph nodes are identified. There is moderate atherosclerotic calcification of the abdominal aorta. The bladder is unremarkable. Mild degenerative disc disease and facet arthrosis are noted in the lumbar spine.  IMPRESSION: Inflammation involving the splenic flexure of the colon, which could reflect infectious or ischemic colitis.   Electronically Signed   By: Logan Bores   On: 09/24/2013 13:15  I personally reviewed the imaging tests through PACS system I reviewed available ER/hospitalization records through the EMR   EKG Interpretation   None       MDM   1. Colitis    Patient with evidence of colitis involving the splenic flexure.  The patient's overall well-appearing.  He feels much better  after IV fluids.  His pain is well controlled at this time.  I do not believe this to be a severe ischemic colitis.  Well-appearing.  DC home with PCP and GI followup.  I told him that he may benefit from colonoscopy in the near future by GI.  He understands the importance of close followup.  He understands return to the ER for new or worsening symptoms    Hoy Morn, MD 09/24/13 [id number]

## 2013-09-24 NOTE — ED Notes (Signed)
Pt c/o lower abd pain last night and this morning before he vomited and had BM. Pt denies no pain at this time.

## 2013-09-25 ENCOUNTER — Non-Acute Institutional Stay (SKILLED_NURSING_FACILITY): Payer: Medicare Other | Admitting: Geriatric Medicine

## 2013-09-25 DIAGNOSIS — E893 Postprocedural hypopituitarism: Secondary | ICD-10-CM

## 2013-09-25 DIAGNOSIS — D519 Vitamin B12 deficiency anemia, unspecified: Secondary | ICD-10-CM

## 2013-09-25 DIAGNOSIS — K529 Noninfective gastroenteritis and colitis, unspecified: Secondary | ICD-10-CM

## 2013-09-25 DIAGNOSIS — I10 Essential (primary) hypertension: Secondary | ICD-10-CM

## 2013-09-25 DIAGNOSIS — K5289 Other specified noninfective gastroenteritis and colitis: Secondary | ICD-10-CM

## 2013-09-25 DIAGNOSIS — E236 Other disorders of pituitary gland: Secondary | ICD-10-CM

## 2013-09-25 DIAGNOSIS — D518 Other vitamin B12 deficiency anemias: Secondary | ICD-10-CM

## 2013-09-25 DIAGNOSIS — E039 Hypothyroidism, unspecified: Secondary | ICD-10-CM

## 2013-09-26 ENCOUNTER — Telehealth: Payer: Self-pay | Admitting: Gastroenterology

## 2013-09-26 ENCOUNTER — Encounter: Payer: Self-pay | Admitting: Geriatric Medicine

## 2013-09-26 DIAGNOSIS — D519 Vitamin B12 deficiency anemia, unspecified: Secondary | ICD-10-CM | POA: Insufficient documentation

## 2013-09-26 DIAGNOSIS — K529 Noninfective gastroenteritis and colitis, unspecified: Secondary | ICD-10-CM | POA: Insufficient documentation

## 2013-09-26 DIAGNOSIS — I10 Essential (primary) hypertension: Secondary | ICD-10-CM | POA: Insufficient documentation

## 2013-09-26 DIAGNOSIS — E893 Postprocedural hypopituitarism: Secondary | ICD-10-CM | POA: Insufficient documentation

## 2013-09-26 DIAGNOSIS — E039 Hypothyroidism, unspecified: Secondary | ICD-10-CM | POA: Insufficient documentation

## 2013-09-26 LAB — CBC AND DIFFERENTIAL
HCT: 36 % — AB (ref 41–53)
HEMOGLOBIN: 12.6 g/dL — AB (ref 13.5–17.5)
Platelets: 211 10*3/uL (ref 150–399)
WBC: 14.2 10*3/mL

## 2013-09-26 LAB — BASIC METABOLIC PANEL
BUN: 14 mg/dL (ref 4–21)
Creatinine: 1.6 mg/dL — AB (ref 0.6–1.3)
Glucose: 81 mg/dL
Potassium: 3.9 mmol/L (ref 3.4–5.3)
Sodium: 133 mmol/L — AB (ref 137–147)

## 2013-09-26 NOTE — Assessment & Plan Note (Addendum)
Colitis diagnosed by CT scan and elevated white blood cell count. Antibiotic therapy has been initiated. Patient has not been eating and drinking well, would benefit from further IV fluid therapy. Limit diet to clear liquids for now. Repeat lab in the morning.

## 2013-09-26 NOTE — Assessment & Plan Note (Signed)
Long-term treatment with low-dose prednisone. Resume this medication tomorrow

## 2013-09-26 NOTE — Progress Notes (Signed)
Patient ID: [name] [name], male   DOB: April 24, 1923, 80 y.o.   MRN: [id number]   [facility] [phone])  Code Status: Living Will, Full Code Contact Information   Name Relation Home Work Mobile   [name] [name] Spouse [phone]     [name] [name] [phone]  [phone]   [name] [name] [name] [name] [phone]     [name] [name] [name] [name] [phone]         Chief Complaint  Patient presents with  . Colon inflammation    Colitis    HPI: This is a 80 y.o. male resident of [place], [place]  Section. He was admitted to the rehabilitation section late this afternoon due to worsening general condition following diagnosis of colitis.  This patient presented to the emergency department at [facility] yesterday after several episodes of bright red blood in his stool.  He had been experiencing increasing generalized weakness, anorexia, weight loss over the past one to 2 weeks. That morning he had nausea and vomiting. No diarrhea.. He reported some crampy left-sided abdominal pain.  ED evaluation included laboratory studies and a CT scan of the abdomen. He was noted with elevated white blood cell count as well as radiographic evidence consistent with colitis. He was treated with IV fluids, started on a course of metronidazole and ciprofloxacin. Patient felt improved after the IV fluids, it was felt the colitis was not a severe case and so patient was discharged home with prescriptions for antibiotics and instructions to followup with GI. Today, patient called the clinic nurse to report he still felt terrible and requested admission to the rehabilitation section. Patient's primary care provider Dr. Eden Emms was notified, he agreed with rehabilitation admission and requested patient to be followed by [facility].    Allergies  Allergen Reactions  . Percocet [Oxycodone-Acetaminophen] Other (See Comments)    Just doesn't like it  . Diovan  [Valsartan] Rash      Medication List       This list is accurate as of: 09/25/13 11:59 PM.  Always use your most recent med list.               ciprofloxacin 500 MG tablet  Commonly known as:  CIPRO  Take 1 tablet (500 mg total) by mouth 2 (two) times daily.     co-enzyme Q-10 30 MG capsule  Take 30 mg by mouth daily.     cyanocobalamin 1000 MCG/ML injection  Commonly known as:  (VITAMIN B-12)  Inject 1,000 mcg into the muscle every 30 (thirty) days.     doxycycline 50 MG tablet  Commonly known as:  ADOXA  Take 50 mg by mouth daily.     finasteride 5 MG tablet  Commonly known as:  PROSCAR  Take 5 mg by mouth daily.     Fish Oil 306 MG Caps  Take 1 capsule by mouth daily.     Flaxseed Misc  15 mLs by Does not apply route daily.     HYDROcodone-acetaminophen 5-325 MG per tablet  Commonly known as:  NORCO/VICODIN  Take 1 tablet by mouth every 6 (six) hours as needed for moderate pain.     levothyroxine 88 MCG tablet  Commonly known as:  SYNTHROID, LEVOTHROID  Take 88 mcg by mouth daily.     metroNIDAZOLE 500 MG tablet  Commonly known as:  FLAGYL  Take 1 tablet (500 mg total) by mouth 2 (two) times daily.     multivitamins ther. w/minerals Tabs tablet  Take  1 tablet by mouth daily.     olmesartan 40 MG tablet  Commonly known as:  BENICAR  Take 40 mg by mouth daily.     ondansetron 8 MG disintegrating tablet  Commonly known as:  ZOFRAN ODT  Take 1 tablet (8 mg total) by mouth every 8 (eight) hours as needed for nausea or vomiting.     pravastatin 80 MG tablet  Commonly known as:  PRAVACHOL  Take 80 mg by mouth daily.     prednisoLONE 5 MG Tabs tablet  Take 2.5-5 mg by mouth. 5 mg in the morning & 2.5 mg at night     promethazine 25 MG suppository  Commonly known as:  PHENERGAN  Place 25 mg rectally every 6 (six) hours as needed for nausea.     SYSTANE OP  Apply 1 drop to eye 4 (four) times daily as needed. For eye dryness     TESTOSTERONE IM    Inject 0.15 mLs into the muscle every 7 (seven) days. On Fridays     VITAMIN D (ERGOCALCIFEROL) PO  Take 1 tablet by mouth daily.        DATA REVIEWED  Radiologic Exams 09/24/2013  CT ABDOMEN AND PELVIS WITH CONTRAST     COMPARISON: None.   IMPRESSION:  Inflammation involving the splenic flexure of the colon, which could  reflect infectious or ischemic colitis.   Cardiovascular Exams:   Laboratory Studies: Lab Results- [facility] ED  Component Value Date   WBC 18.5* 09/24/2013   HGB 13.4 09/24/2013   HCT 39.2 09/24/2013   PLT 227 09/24/2013        GLUCOSE 78 09/24/2013   ALT 15 09/24/2013   AST 17 09/24/2013   NA 135* 09/24/2013   K 3.9 09/24/2013   CL 98 09/24/2013   CREATININE 1.35 09/24/2013   BUN 24* 09/24/2013   CO2 27 09/24/2013        Past Medical History  Diagnosis Date  . Hypertension   . Arthritis     Status post left total replacement 8 2011  . Difficult intubation 1994    surgery had to be  stopped due to injury to "throat"  . Difficult intubation 1994    no trouble since.  Required nasotracheal intubation  '02 [facility]   . Pituitary mass 2000    S/p transphenoidal excision 05/1999 ([facility])  . 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 Hypothyroidism (acquired) 2000  . Anemia, B12 deficiency 2000  . Hyperlipidemia 2003  . Vitamin D deficiency 2009  . History of renal cell carcinoma 1994    Status post right nephrectomy  . BPH (benign prostatic hyperplasia) 2013  . Injury of right rotator cuff 2013    Status post right shoulder arthroplasty April 2013  . Glaucoma 2007    Status post left trabeculectomy 2007  . Blepharitis of both eyes 2006    Status post ectropion surgery bilateral  . GERD (gastroesophageal reflux disease)   . History of acute pancreatitis 1984  . History of colon polyps     Colonoscopy 2001  . Allergic rhinitis     Prior allergy shots 20 years  . Colitis 2014   Past Surgical History  Procedure  Laterality Date  . Tonsillectomy    . Eye surgery  over last 6 yrs.      trabeculectomy...   . Eye surgery   cat ext ou  . Brain surgery  2000  pituatary gland removed .  Marland Kitchen Joint replacement  2011    knee left  . Cholecystectomy  1984  . Reverse shoulder arthroplasty  12/15/2011    Procedure: REVERSE SHOULDER ARTHROPLASTY;  Surgeon: Marin Shutter, MD;  Location: [place];  Service: Orthopedics;  Laterality: Right;  right total reverse shoulder  . Nephrectomy Right 1994    Renal cell  . Transsphenoidal excision pituitary tumor  05/1999    A.Tommi Rumps, M.D.([facility])  . Ectropion surgery Bilateral 2006   Family Status  Relation Status Death Age  . Father Deceased 68    Lung cancer, history of smoking  . Mother Deceased   . Son Alive   . Son Alive   . Son Alive    History   Social History Narrative   Patient is Married Software engineer). Retired Engineer, mining) Administrator, sports. Lives in apartment, Independent Living section at [place] since 2000.  Spouse lives in skilled nursing section in same community   Smoking 1994 , moderate alcohol use: 2 drinks a night. Exercises regularly with walking and exercise classes. Continues to drive   Patient has Advanced planning documents: Living Will                REVIEW OF SYSTEMS  DATA OBTAINED: from patient GENERAL: Feels "terrible" ,  no appetite, weak   SKIN: No itch, rash or open wounds EYES: No eye pain, dryness or itching  No change in vision EARS: No earache, tinnitus, change in hearing NOSE: No congestion, drainage or bleeding MOUTH/THROAT: No mouth or tooth pain  No sore throat   No difficulty chewing or swallowing RESPIRATORY: No cough, wheezing, SOB CARDIAC: No chest pain, palpitations  No edema. GI: Abdominal discomfort present,   No N/V/D or constipation, last BM today, no blood noted No heartburn or reflux  GU: No dysuria, frequency or urgency  No change in urine volume or character    MUSCULOSKELETAL: No joint pain, swelling or stiffness  No back pain  generalized weakness present.  NEUROLOGIC: No dizziness, fainting, headache No change in mental status.  PSYCHIATRIC: Feels frustrated because he is ill  PHYSICAL EXAM Filed Vitals:   09/25/13 1638  BP: 100/57  Pulse: 85  Temp: 98.3 F (36.8 C)  Resp: 16  SpO2: 95%   There is no weight on file to calculate BMI.  GENERAL APPEARANCE: No acute distress, appropriately groomed, normal body habitus. Alert, pleasant, less conversant than usual. SKIN: No diaphoresis, rash, HEAD: Normocephalic, atraumatic EYES: Conjunctiva/lids clear. Pupils round, reactive. EARS: External exam WNL, Poor Hearing evident . NOSE: No deformity or discharge. MOUTH/THROAT: Lips w/o lesions. Oral mucosa, tongue moist, w/o lesion. Oropharynx w/o redness or lesions.  NECK: Supple, full ROM. No thyroid tenderness, enlargement or nodule LYMPHATICS: No head, neck or supraclavicular adenopathy RESPIRATORY: Breathing is even, unlabored. Lung sounds are clear and full.  CARDIOVASCULAR: Heart RRR. No murmur or extra heart sounds  ARTERIAL: No carotid bruit.  EDEMA: No peripheral edema. GASTROINTESTINAL: Abdomen is diffusely tender,  distended w/ decreased  bowel sounds. MUSCULOSKELETAL: Moves all extremities with full ROM, strength and tone.  NEUROLOGIC: Oriented to time, place, person. Cranial nerves 2-12 grossly intact, speech clear, no tremor. PSYCHIATRIC: Mood and affect appropriate to situation  ASSESSMENT/PLAN  Hypertension Blood pressure a bit low today, he's not been eating and drinking well. Hold p.o. medicines for now  Colitis Colitis diagnosed by CT scan and elevated white blood cell count. Antibiotic therapy has been initiated. Patient has not been  eating and drinking well, would benefit from further IV fluid therapy. Limit diet to clear liquids for now. Repeat lab in the morning.  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 Resume this medication tomorrow   Follow up: As needed  Lab 09/26/13 CBC, CMP  Xabi Wittler T.Brittaney Beaulieu, NP-C 09/26/2013

## 2013-09-26 NOTE — Telephone Encounter (Signed)
Patient has [employer] and needs a referral from the primary care.  I have spoken with the rehab center and they will find out who his primary care is and if referral is needed they will arrange through the primary care.  They will call back to schedule with Dr. Fuller Plan or an extender once they find out what his insurance is

## 2013-09-26 NOTE — Assessment & Plan Note (Addendum)
Blood pressure a bit low today, he's not been eating and drinking well. Hold p.o. medicines for now

## 2013-09-27 ENCOUNTER — Non-Acute Institutional Stay (SKILLED_NURSING_FACILITY): Payer: Medicare Other | Admitting: Geriatric Medicine

## 2013-09-27 ENCOUNTER — Encounter: Payer: Self-pay | Admitting: Geriatric Medicine

## 2013-09-27 DIAGNOSIS — K529 Noninfective gastroenteritis and colitis, unspecified: Secondary | ICD-10-CM

## 2013-09-27 DIAGNOSIS — K5289 Other specified noninfective gastroenteritis and colitis: Secondary | ICD-10-CM

## 2013-09-27 LAB — BASIC METABOLIC PANEL
BUN: 11 mg/dL (ref 4–21)
CREATININE: 1.6 mg/dL — AB (ref 0.6–1.3)
Glucose: 73 mg/dL
Potassium: 4.1 mmol/L (ref 3.4–5.3)
Sodium: 126 mmol/L — AB (ref 137–147)

## 2013-09-27 NOTE — Progress Notes (Signed)
Patient ID: [name] [name], male   DOB: [birth year], 80 y.o.   MRN: [id number]   [facility] [phone])  Code Status: Living Will, Full Code     Contact Information   Name Relation Home Work Mobile   Kelso Spouse [phone]     Edel, Rivero [phone]  [phone]   Knoah, Nedeau [phone]     Yojan, P[place]ett [phone]         Chief Complaint  Patient presents with  . Inflammatory colitis    HPI: This is a 80 y.o. male resident of [place], [place]  [facility]. He was admitted to the rehabilitation section late this afternoon due to worsening general condition following diagnosis of colitis.    Last visit:  Hypertension Blood pressure a bit low today, he's not been eating and drinking well. Hold p.o. medicines for now  Colitis Colitis diagnosed by CT scan and elevated white blood cell count. Antibiotic therapy has been initiated. Patient has not been eating and drinking well, would benefit from further IV fluid therapy. Limit diet to clear liquids for now. Repeat lab in the morning.  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 Resume this medication tomorrow  Since last visit, patient has continued to feel ill. He is experiencing nausea with the antibiotic regimen, Zofran helps. P.o. intake has been liquids only, he continues to void adequately and has had normal bowel movements. He has not been out of bed. Lab 1/29 showed improving WBCs, BMP yesterday showed mildly decreased sodium and glucose    Allergies  Allergen Reactions  . Percocet [Oxycodone-Acetaminophen] Other (See Comments)    Just doesn't like it  . Diovan [Valsartan] Rash    MEDICATION- Reviewed  DATA REVIEWED  Radiologic Exams 09/24/2013  CT ABDOMEN AND PELVIS WITH CONTRAST     COMPARISON: None.   IMPRESSION:  Inflammation involving the splenic flexure of the colon, which could    reflect infectious or ischemic colitis.   Cardiovascular Exams:   Laboratory Studies: Lab Results- [facility] ED 09/24/2013  Component Value   WBC 18.5*   HGB 13.4   HCT 39.2   PLT 227       GLUCOSE 78   ALT 15   AST 17   NA 135*   K 3.9   CL 98   CREATININE 1.35   BUN 24*   CO2 27   Lab Results-[facility] 09/26/2013  Component Value   WBC 14.2   HGB 12.6*   HCT 36*   PLT 211       GLUCOSE 81   NA 133*   K 3.9   CREATININE 1.6*   BUN 14   Lab Results- [facility] 09/27/2013  Component Value   GLUCOSE 73   NA 126*   K 4.1   CREATININE 1.6*   BUN 11   REVIEW OF SYSTEMS  DATA OBTAINED: from patient GENERAL: Feels "ill",  no appetite, weak   SKIN: No itch, rash or open wounds EYES: No eye pain, dryness or itching  No change in vision EARS: No earache, tinnitus, change in hearing NOSE: No congestion, drainage or bleeding MOUTH/THROAT: No mouth or tooth pain  No sore throat   No difficulty chewing or swallowing RESPIRATORY: No cough, wheezing, SOB CARDIAC: No chest pain, palpitations  No edema. GI: Abdominal discomfort present,   No N/V/D or constipation, last BM today, no blood noted No heartburn or reflux  GU: No dysuria, frequency or urgency  No change  in urine volume or character  MUSCULOSKELETAL: No joint pain, swelling or stiffness  No back pain  generalized weakness present.  NEUROLOGIC: No dizziness, fainting, headache No change in mental status.  PSYCHIATRIC: Feels frustrated because he is ill  PHYSICAL EXAM Filed Vitals:   09/27/13 1350  BP: 139/70  Pulse: 70  Temp: 97.7 F (36.5 C)  Resp: 18  SpO2: 96%   There is no weight on file to calculate BMI.  GENERAL APPEARANCE: No acute distress, appropriately groomed, normal body habitus. Alert, pleasant, less conversant than usual. SKIN: No diaphoresis, rash, HEAD: Normocephalic, atraumatic EYES: Conjunctiva/lids clear. Pupils round, reactive. EARS: External exam WNL, Poor Hearing evident . NOSE: No  deformity or discharge. MOUTH/THROAT: Lips w/o lesions. Oral mucosa, tongue moist, w/o lesion. Oropharynx w/o redness or lesions.  NECK: Supple, full ROM. No thyroid tenderness, enlargement or nodule LYMPHATICS: No head, neck or supraclavicular adenopathy RESPIRATORY: Breathing is even, unlabored. Lung sounds are clear and full.  CARDIOVASCULAR: Heart RRR. No murmur or extra heart sounds  ARTERIAL: No carotid bruit.  EDEMA: No peripheral edema. GASTROINTESTINAL: Abdomen is non distended, soft . Less tender.  MUSCULOSKELETAL: Moves all extremities with full ROM, strength and tone.  NEUROLOGIC: Oriented to time, place, person. Cranial nerves 2-12 grossly intact, speech clear, no tremor. PSYCHIATRIC: Mood and affect appropriate to situation  ASSESSMENT/PLAN  Colitis Continues with abdominal discomfort, nausea. Odansetron helps. Is tolerating liquids only so far, NA a bit low, finishing 2nd liter of NS today. Dietician in today, recommends [place]. May advance diet to soft bland foods as tolerates. Recommend increasing activity tomorrow. Repeat lab 10/01/13    Follow up: As needed  Lab 10/01/13  CBC, CMP  Kamela Blansett T.Evaleen Sant, NP-C 09/27/2013

## 2013-09-27 NOTE — Assessment & Plan Note (Signed)
Continues with abdominal discomfort, nausea. Odansetron helps. Is tolerating liquids only so far, NA a bit low, finishing 2nd liter of NS today. Dietician in today, recommends [place]. May advance diet to soft bland foods as tolerates. Recommend increasing activity tomorrow. Repeat lab 10/01/13

## 2013-10-01 ENCOUNTER — Non-Acute Institutional Stay (SKILLED_NURSING_FACILITY): Payer: Medicare Other | Admitting: Geriatric Medicine

## 2013-10-01 ENCOUNTER — Encounter: Payer: Self-pay | Admitting: Gastroenterology

## 2013-10-01 ENCOUNTER — Encounter: Payer: Self-pay | Admitting: Geriatric Medicine

## 2013-10-01 DIAGNOSIS — I10 Essential (primary) hypertension: Secondary | ICD-10-CM

## 2013-10-01 DIAGNOSIS — K5289 Other specified noninfective gastroenteritis and colitis: Secondary | ICD-10-CM

## 2013-10-01 DIAGNOSIS — K529 Noninfective gastroenteritis and colitis, unspecified: Secondary | ICD-10-CM

## 2013-10-01 LAB — BASIC METABOLIC PANEL
BUN: 23 mg/dL — AB (ref 4–21)
Creatinine: 1.6 mg/dL — AB (ref 0.6–1.3)
Glucose: 77 mg/dL
Potassium: 4.1 mmol/L (ref 3.4–5.3)
Sodium: 138 mmol/L (ref 137–147)

## 2013-10-01 NOTE — Progress Notes (Signed)
Patient ID: [name] [name], male   DOB: March 25, 1923, 80 y.o.   MRN: [id number]   [facility] [phone])  Code Status: Living Will, Full Code  Contact Information   Name Relation Home Work Mobile   [place] Spouse [phone]     Ceylon, Arenson [phone]  [phone]   C[place]lee, Patil [phone]     Conor, Lata [phone]         Chief Complaint  Patient presents with  . inflammatory colitis    Rehab discharge  . Discharge Note    HPI: This is a 80 y.o. male resident of [place] [place]  [facility]. He was admitted to the rehabilitation section last week due to worsening general condition following diagnosis of colitis.  This patient presented to the emergency department at [facility] 09/24/2013 after several episodes of bright red blood in his stool.  He had been experiencing increasing generalized weakness, anorexia, weight loss over the prior one to 2 weeks. That morning he had nausea and vomiting. No diarrhea.. He reported some crampy left-sided abdominal pain.  ED evaluation included laboratory studies and a CT scan of the abdomen. He was noted with elevated white blood cell count as well as radiographic evidence consistent with colitis. He was treated with IV fluids, started on a course of metronidazole and ciprofloxacin. Patient felt improved after the IV fluids, it was felt the colitis was not a severe case and so patient was discharged home with prescriptions for antibiotics and instructions to followup with GI. 09/27/2013 patient called the clinic nurse to report he still felt terrible and requested admission to the rehabilitation section. Patient's primary care provider Dr. Eden Emms was notified, he agreed with rehabilitation admission and requested patient to be followed by [facility].  Last visit:  Colitis Continues with abdominal discomfort, nausea. Odansetron helps. Is tolerating  liquids only so far, Na a bit low, finishing 2nd liter of NS today. Dietician in today, recommends [place]. May advance diet to soft bland foods as tolerates. Recommend increasing activity tomorrow. Repeat lab 10/01/13    Since last visit patient's general condition has improved significantly. He is eating and drinking well, abdominal pain has resolved, bowel habit has returned to usual functional status. He is independent with all ADLs and ambulating safely. Laboratory today with improvement   Allergies  Allergen Reactions  . Percocet [Oxycodone-Acetaminophen] Other (See Comments)    Just doesn't like it  . Diovan [Valsartan] Rash    MEDICATION- Reviewed  DATA REVIEWED  Radiologic Exams 09/24/2013  CT ABDOMEN AND PELVIS WITH CONTRAST     COMPARISON: None.   IMPRESSION:  Inflammation involving the splenic flexure of the colon, which could reflect infectious or ischemic colitis.   Cardiovascular Exams:   Laboratory Studies: Lab Results- [facility] ED 09/24/2013  Component Value   WBC 18.5*   HGB 13.4   HCT 39.2   PLT 227       GLUCOSE 78   ALT 15   AST 17   NA 135*   K 3.9   CL 98   CREATININE 1.35   BUN 24*   CO2 27   Lab Results-[facility] 09/26/2013  Component Value   WBC 14.2   HGB 12.6*   HCT 36*   PLT 211       GLUCOSE 81   NA 133*   K 3.9   CREATININE 1.6*   BUN 14   Lab Results- [facility] 09/27/2013  Component Value   GLUCOSE  73   NA 126*   K 4.1   CREATININE 1.6*   BUN 11   Lab Results- [facility] 10/01/2013  Component Value   GLUCOSE 78   NA 138   K 4.1   CREATININE 1.6*   BUN 23*    REVIEW OF SYSTEMS  DATA OBTAINED: from patient GENERAL: Feels very well, appetite very good, ambulating easily SKIN: No itch, rash or open wounds EYES: No eye pain, dryness or itching  No change in vision EARS: No earache, tinnitus, change in hearing NOSE: No congestion, drainage or bleeding MOUTH/THROAT: No mouth or tooth pain  No sore throat   No difficulty chewing or  swallowing RESPIRATORY: No cough, wheezing, SOB CARDIAC: No chest pain, palpitations  No edema. GI: No abdominal discomfort present,   No N/V/D or constipation, last BM today, no blood noted No heartburn or reflux  GU: No dysuria, frequency or urgency  No change in urine volume or character  MUSCULOSKELETAL: No joint pain, swelling or stiffness  No back pain  NEUROLOGIC: No dizziness, fainting, headache No change in mental status.  PSYCHIATRIC: Feeling much better in general.   No anxiety or depression. Sleeping OK   PHYSICAL EXAM Filed Vitals:   10/01/13 1059  BP: 139/75  Pulse: 65  Temp: 98.1 F (36.7 C)  Resp: 18  Weight: 179 lb 9.6 oz (81.466 kg)  SpO2: 98%   Body mass index is 25.06 kg/(m^2).  GENERAL APPEARANCE: No acute distress, appropriately groomed, normal body habitus. Alert, pleasant, less conversant than usual. SKIN: No diaphoresis, rash, HEAD: Normocephalic, atraumatic EYES: Conjunctiva/lids clear. Pupils round, reactive. EARS: External exam WNL, Poor Hearing evident . NOSE: No deformity or discharge. MOUTH/THROAT: Lips w/o lesions. Oral mucosa, tongue moist, w/o lesion. Oropharynx w/o redness or lesions.  NECK: Supple, full ROM. No thyroid tenderness, enlargement or nodule LYMPHATICS: No head, neck or supraclavicular adenopathy RESPIRATORY: Breathing is even, unlabored. Lung sounds are clear and full.  CARDIOVASCULAR: Heart RRR. No murmur or extra heart sounds  ARTERIAL: No carotid bruit.  EDEMA: No peripheral edema. GASTROINTESTINAL: Abdomen is non distended, soft , non tender.  MUSCULOSKELETAL: Moves all extremities with full ROM, strength and tone.  NEUROLOGIC: Oriented to time, place, person. Cranial nerves 2-12 grossly intact, speech clear, no tremor. PSYCHIATRIC: Mood and affect appropriate to situation  ASSESSMENT/PLAN - Discharge to [facility] [place] this afternoon. Recommend followup with Dr. Elyse Hsu in 3-4 weeks, patient will  arrange GI followup with Dr. Fuller Plan  Hypertension Remains well controlled on current medications.  Colitis Symptoms have resolved, patient will  complete antibiotic course tomorrow. Followup with Dr. Fuller Plan has been recommended, patient will arrange this appointment himself.    Amelia Macken T.Dannis Deroche, NP-C 10/01/2013

## 2013-10-01 NOTE — Assessment & Plan Note (Signed)
Remains well controlled on current medications.

## 2013-10-01 NOTE — Assessment & Plan Note (Signed)
Symptoms have resolved, patient will  complete antibiotic course tomorrow. Followup with Dr. Fuller Plan has been recommended, patient will arrange this appointment himself.

## 2013-10-09 ENCOUNTER — Telehealth: Payer: Self-pay | Admitting: Gastroenterology

## 2013-10-09 NOTE — Telephone Encounter (Signed)
Patient would like and earlier appt for diarrhea and discuss a colonoscopy.  Patient is scheduled to see Nicoletta Ba PA tomorrow at 2:00

## 2013-10-10 ENCOUNTER — Ambulatory Visit (INDEPENDENT_AMBULATORY_CARE_PROVIDER_SITE_OTHER): Payer: Medicare Other | Admitting: Physician Assistant

## 2013-10-10 ENCOUNTER — Encounter: Payer: Self-pay | Admitting: Physician Assistant

## 2013-10-10 VITALS — BP 118/68 | HR 97 | Ht 71.0 in | Wt 186.2 lb

## 2013-10-10 DIAGNOSIS — R112 Nausea with vomiting, unspecified: Secondary | ICD-10-CM

## 2013-10-10 DIAGNOSIS — K5289 Other specified noninfective gastroenteritis and colitis: Secondary | ICD-10-CM

## 2013-10-10 DIAGNOSIS — K529 Noninfective gastroenteritis and colitis, unspecified: Secondary | ICD-10-CM

## 2013-10-10 DIAGNOSIS — K55059 Acute (reversible) ischemia of intestine, part and extent unspecified: Secondary | ICD-10-CM

## 2013-10-10 DIAGNOSIS — K55039 Acute (reversible) ischemia of large intestine, extent unspecified: Secondary | ICD-10-CM

## 2013-10-10 DIAGNOSIS — R9389 Abnormal findings on diagnostic imaging of other specified body structures: Secondary | ICD-10-CM

## 2013-10-10 MED ORDER — MOVIPREP 100 G PO SOLR: 1.0000 | ORAL | Status: DC

## 2013-10-10 NOTE — Progress Notes (Signed)
Subjective:    Patient ID: [name] [name], male    DOB: Nov 27, 1922, 80 y.o.   MRN: [id number]  HPI  [name] [name] is a pleasant 13 H. or old white male known remotely to Dr. Fuller Plan from colonoscopy done in 2001. He had 3 polyps removed at that time and was noted to have mild sigmoid diverticulosis and internal hemorrhoids. Biopsies from the polyps were consistent with hyperplastic polyps. The colonoscopy was incomplete and he then had a barium enema which showed mild right-sided colonic diverticulosis and was otherwise unremarkable. Patient comes in now after an episode occurring on January 27 which took him to the emergency room. He says he really had not been feeling great since Christmas time. His appetite had been decreased and he had lost a little weight. He had says he had been under stress as his wife has been ill. However he had been exercising etc. On the evening of January 26 he says he didn't feel well didn't E. much that day felt nauseated in the middle night and then the following morning had a bowel movement which contained a lot of red and dark red blood. He lives at [facility] was evaluated by the nurse there and taken to the emergency room.  Labs showed a WBC of 18.5 hemoglobin 13.4 hematocrit of 39.2 BUN was 24 creatinine 1.35. He had CT scan of the abdomen and pelvis showed him to be status post right nephrectomy. He had circumferential thickening of the splenic flexure with surrounding fat stranding question infectious versus ischemic. He was given a course of Cipro and Flagyl which he took for about a week. Patient says he has not been having any abdominal pain over the past week or so is having normal bowel movements and has not seen any further blood since that initial episode. He has had no fever or chills. His appetite is fair and he is unhappy with the restricted diet he has been placed on at [place] since this episode. Repeat labs checked on 09/26/2013 WBC of 14.2 hemoglobin 12.6  hematocrit of 36   Review of Systems  Constitutional: Positive for appetite change and fatigue.  HENT: Negative.   Eyes: Negative.   Respiratory: Negative.   Cardiovascular: Negative.   Gastrointestinal: Positive for abdominal pain and blood in stool.  Endocrine: Negative.   Genitourinary: Negative.   Allergic/Immunologic: Negative.   Neurological: Negative.   Hematological: Negative.   Psychiatric/Behavioral: Negative.    Outpatient Prescriptions Prior to Visit  Medication Sig Dispense Refill  . co-enzyme Q-10 30 MG capsule Take 30 mg by mouth daily.      . cyanocobalamin (,VITAMIN B-12,) 1000 MCG/ML injection Inject 1,000 mcg into the muscle every 30 (thirty) days.      Marland Kitchen doxycycline (ADOXA) 50 MG tablet Take 50 mg by mouth daily.      . finasteride (PROSCAR) 5 MG tablet Take 5 mg by mouth daily.      . Flaxseed MISC 15 mLs by Does not apply route daily.      Marland Kitchen levothyroxine (SYNTHROID, LEVOTHROID) 88 MCG tablet Take 88 mcg by mouth daily.      . Multiple Vitamins-Minerals (MULTIVITAMINS THER. W/MINERALS) TABS tablet Take 1 tablet by mouth daily.      Marland Kitchen olmesartan (BENICAR) 40 MG tablet Take 40 mg by mouth daily.      . Omega-3 Fatty Acids (FISH OIL) 306 MG CAPS Take 1 capsule by mouth daily.      . ondansetron (ZOFRAN ODT) 8 MG  disintegrating tablet Take 1 tablet (8 mg total) by mouth every 8 (eight) hours as needed for nausea or vomiting.  10 tablet  0  . Polyethyl Glycol-Propyl Glycol (SYSTANE OP) Apply 1 drop to eye 4 (four) times daily as needed. For eye dryness      . pravastatin (PRAVACHOL) 80 MG tablet Take 80 mg by mouth daily.      . prednisoLONE 5 MG TABS Take 2.5-5 mg by mouth. 5 mg in the morning & 2.5 mg at night      . TESTOSTERONE IM Inject 0.15 mLs into the muscle every 7 (seven) days. On Fridays      . VITAMIN D, ERGOCALCIFEROL, PO Take 1 tablet by mouth daily.      Marland Kitchen HYDROcodone-acetaminophen (NORCO/VICODIN) 5-325 MG per tablet Take 1 tablet by mouth every 6  (six) hours as needed for moderate pain.      . promethazine (PHENERGAN) 25 MG suppository Place 25 mg rectally every 6 (six) hours as needed for nausea.       No facility-administered medications prior to visit.   Allergies  Allergen Reactions  . Percocet [Oxycodone-Acetaminophen] Other (See Comments)    Just doesn't like it  . Diovan [Valsartan] Rash   Patient Active Problem List   Diagnosis Date Noted  . Hypertension   . 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 Hypothyroidism (acquired)   . Anemia, B12 deficiency   . Colitis   . Arthritis of shoulder region, right 12/15/2011   History  Substance Use Topics  . Smoking status: Former Smoker    Types: Cigarettes, Pipe    Quit date: 09/26/1992  . Smokeless tobacco: Never Used  . Alcohol Use: 4.2 oz/week    7 Shots of liquor per week   family history is negative for Anesthesia problems, Hypotension, Malignant hyperthermia, and Pseudochol deficiency.     Objective:   Physical Exam   Well-developed elderly white male in no acute distress, blood pressure 118/68 pulse 97 height 5 foot 11  weight 186. HEENT; nontraumatic normocephalic EOMI PERRLA sclera anicteric, wears a hearing aid, Supple; no JVD, Cardiovascular; regular rate and rhythm with S1-S2 no murmur or gallop, Ulnar clear bilaterally, Abdomen ;soft basically nontender nondistended bowel sounds are active there is no palpable mass or hepatosplenomegaly, Rectal ;exam not done, [place] is no clubbing cyanosis or edema skin warm and dry, Psych; mood and affect normal and appropriate       Assessment & Plan:  #80  80 year old white male with an acute episode of colitis 09/24/2013 involving the splenic flexure of the colon on CT. Etiology is not clear though suspect this may have been ischemic-rule out occult and.Marland Kitchen He is feeling better though still fatigued and has a poor appetite #2 history of hyperplastic colon polyps last colonoscopy 2001 #3 diverticulosis #4 status post  right nephrectomy #5 hypertension Plan;  patient is advised she may advance to his usual diet Schedule for colonoscopy with Dr. Merita Norton discussed in detail with patient he is agreeable to proceed.

## 2013-10-10 NOTE — Progress Notes (Signed)
Reviewed and agree with management plan. Incomplete colonoscopy last time it was attempted, hopefully can reach at least the splenic flexure to evaluate abnl CT findings.  Pricilla Riffle. Fuller Plan, MD [facility]

## 2013-10-10 NOTE — Patient Instructions (Signed)
You have been scheduled for a colonoscopy with propofol. Please follow written instructions given to you at your visit today.  Please pick up your prep kit at the pharmacy within the next 1-3 days. If you use inhalers (even only as needed), please bring them with you on the day of your procedure.  Go back to your usual diet.Marland Kitchen

## 2013-10-14 ENCOUNTER — Other Ambulatory Visit: Payer: Self-pay

## 2013-10-14 ENCOUNTER — Telehealth: Payer: Self-pay

## 2013-10-14 DIAGNOSIS — R109 Unspecified abdominal pain: Secondary | ICD-10-CM

## 2013-10-14 NOTE — Telephone Encounter (Signed)
Message copied by Marlon Pel on Mon Oct 14, 2013 10:24 AM ------      Message from: [name] [name]      Created: Sun Oct 13, 2013  4:21 PM       Pt on [facility] schedule for Tuesday. Please move to the hospital per Jenny Reichmann Nulty's notes for the following week. Does not need MAC. ------

## 2013-10-14 NOTE — Telephone Encounter (Signed)
Patient aware I have rescheduled to the procedure to [facility] for 10/23/13.  I have mailed him new instructions

## 2013-10-15 ENCOUNTER — Encounter: Payer: Self-pay | Admitting: Gastroenterology

## 2013-10-15 ENCOUNTER — Encounter: Payer: Medicare Other | Admitting: Gastroenterology

## 2013-10-16 ENCOUNTER — Telehealth: Payer: Self-pay | Admitting: Gastroenterology

## 2013-10-16 NOTE — Telephone Encounter (Signed)
I have left a message the instructions were mailed on Monday, but due to inclement weather may not have gone out of the building until this am.  He is asked to call back with a fax number if he has one or expect it late this week.

## 2013-10-17 NOTE — Telephone Encounter (Signed)
I have reviewed with him over the phone all the instructions.  He will call back for additional questions or concerns

## 2013-10-17 NOTE — Telephone Encounter (Signed)
Left message for patient to call back  

## 2013-10-19 ENCOUNTER — Inpatient Hospital Stay (HOSPITAL_COMMUNITY)
Admission: EM | Admit: 2013-10-19 | Discharge: 2013-10-22 | DRG: 439 | Disposition: A | Discharge status: Skilled Nursing Facility | Payer: Medicare Other | Attending: Internal Medicine | Admitting: Internal Medicine

## 2013-10-19 ENCOUNTER — Emergency Department (HOSPITAL_COMMUNITY): Payer: Medicare Other

## 2013-10-19 ENCOUNTER — Encounter (HOSPITAL_COMMUNITY): Payer: Self-pay | Admitting: Emergency Medicine

## 2013-10-19 DIAGNOSIS — K859 Acute pancreatitis without necrosis or infection, unspecified: Principal | ICD-10-CM | POA: Diagnosis present

## 2013-10-19 DIAGNOSIS — Z85528 Personal history of other malignant neoplasm of kidney: Secondary | ICD-10-CM

## 2013-10-19 DIAGNOSIS — R5381 Other malaise: Secondary | ICD-10-CM | POA: Diagnosis present

## 2013-10-19 DIAGNOSIS — R110 Nausea: Secondary | ICD-10-CM

## 2013-10-19 DIAGNOSIS — Z860100 Personal history of colon polyps, unspecified: Secondary | ICD-10-CM

## 2013-10-19 DIAGNOSIS — R531 Weakness: Secondary | ICD-10-CM | POA: Diagnosis present

## 2013-10-19 DIAGNOSIS — Z905 Acquired absence of kidney: Secondary | ICD-10-CM

## 2013-10-19 DIAGNOSIS — R059 Cough, unspecified: Secondary | ICD-10-CM | POA: Diagnosis present

## 2013-10-19 DIAGNOSIS — N189 Chronic kidney disease, unspecified: Secondary | ICD-10-CM | POA: Diagnosis present

## 2013-10-19 DIAGNOSIS — R5383 Other fatigue: Secondary | ICD-10-CM

## 2013-10-19 DIAGNOSIS — R109 Unspecified abdominal pain: Secondary | ICD-10-CM | POA: Diagnosis present

## 2013-10-19 DIAGNOSIS — K5900 Constipation, unspecified: Secondary | ICD-10-CM | POA: Diagnosis present

## 2013-10-19 DIAGNOSIS — E039 Hypothyroidism, unspecified: Secondary | ICD-10-CM | POA: Diagnosis present

## 2013-10-19 DIAGNOSIS — E785 Hyperlipidemia, unspecified: Secondary | ICD-10-CM | POA: Diagnosis present

## 2013-10-19 DIAGNOSIS — I129 Hypertensive chronic kidney disease with stage 1 through stage 4 chronic kidney disease, or unspecified chronic kidney disease: Secondary | ICD-10-CM | POA: Diagnosis present

## 2013-10-19 DIAGNOSIS — R630 Anorexia: Secondary | ICD-10-CM | POA: Diagnosis present

## 2013-10-19 DIAGNOSIS — E871 Hypo-osmolality and hyponatremia: Secondary | ICD-10-CM | POA: Diagnosis present

## 2013-10-19 DIAGNOSIS — Z96619 Presence of unspecified artificial shoulder joint: Secondary | ICD-10-CM

## 2013-10-19 DIAGNOSIS — Z87891 Personal history of nicotine dependence: Secondary | ICD-10-CM

## 2013-10-19 DIAGNOSIS — Z96659 Presence of unspecified artificial knee joint: Secondary | ICD-10-CM

## 2013-10-19 DIAGNOSIS — Z9089 Acquired absence of other organs: Secondary | ICD-10-CM

## 2013-10-19 DIAGNOSIS — D519 Vitamin B12 deficiency anemia, unspecified: Secondary | ICD-10-CM

## 2013-10-19 DIAGNOSIS — R05 Cough: Secondary | ICD-10-CM | POA: Diagnosis present

## 2013-10-19 DIAGNOSIS — E860 Dehydration: Secondary | ICD-10-CM | POA: Diagnosis present

## 2013-10-19 DIAGNOSIS — I10 Essential (primary) hypertension: Secondary | ICD-10-CM | POA: Diagnosis present

## 2013-10-19 DIAGNOSIS — E869 Volume depletion, unspecified: Secondary | ICD-10-CM | POA: Diagnosis present

## 2013-10-19 DIAGNOSIS — K219 Gastro-esophageal reflux disease without esophagitis: Secondary | ICD-10-CM | POA: Diagnosis present

## 2013-10-19 DIAGNOSIS — N179 Acute kidney failure, unspecified: Secondary | ICD-10-CM | POA: Diagnosis present

## 2013-10-19 LAB — COMPREHENSIVE METABOLIC PANEL
ALBUMIN: 3.1 g/dL — AB (ref 3.5–5.2)
ALT: 17 U/L (ref 0–53)
AST: 17 U/L (ref 0–37)
Alkaline Phosphatase: 49 U/L (ref 39–117)
BILIRUBIN TOTAL: 0.5 mg/dL (ref 0.3–1.2)
BUN: 17 mg/dL (ref 6–23)
CO2: 27 mEq/L (ref 19–32)
Calcium: 9.4 mg/dL (ref 8.4–10.5)
Chloride: 93 mEq/L — ABNORMAL LOW (ref 96–112)
Creatinine, Ser: 1.38 mg/dL — ABNORMAL HIGH (ref 0.50–1.35)
GFR calc Af Amer: 51 mL/min — ABNORMAL LOW (ref >90)
GFR calc non Af Amer: 44 mL/min — ABNORMAL LOW (ref >90)
Glucose, Bld: 86 mg/dL (ref 70–99)
Potassium: 4.4 mEq/L (ref 3.7–5.3)
Sodium: 131 mEq/L — ABNORMAL LOW (ref 137–147)
Total Protein: 6 g/dL (ref 6.0–8.3)

## 2013-10-19 LAB — URINALYSIS, ROUTINE W REFLEX MICROSCOPIC
BILIRUBIN URINE: NEGATIVE
Glucose, UA: NEGATIVE mg/dL
Hgb urine dipstick: NEGATIVE
Ketones, ur: NEGATIVE mg/dL
Leukocytes, UA: NEGATIVE
NITRITE: NEGATIVE
Protein, ur: NEGATIVE mg/dL
Specific Gravity, Urine: 1.014 (ref 1.005–1.030)
UROBILINOGEN UA: 0.2 mg/dL (ref 0.0–1.0)
pH: 6 (ref 5.0–8.0)

## 2013-10-19 LAB — LIPASE, BLOOD: LIPASE: 68 U/L — AB (ref 11–59)

## 2013-10-19 LAB — CBC
HEMATOCRIT: 33.6 % — AB (ref 39.0–52.0)
Hemoglobin: 11.5 g/dL — ABNORMAL LOW (ref 13.0–17.0)
MCH: 32.4 pg (ref 26.0–34.0)
MCHC: 34.2 g/dL (ref 30.0–36.0)
MCV: 94.6 fL (ref 78.0–100.0)
Platelets: 273 10*3/uL (ref 150–400)
RBC: 3.55 MIL/uL — ABNORMAL LOW (ref 4.22–5.81)
RDW: 13.6 % (ref 11.5–15.5)
WBC: 9 10*3/uL (ref 4.0–10.5)

## 2013-10-19 LAB — I-STAT CG4 LACTIC ACID, ED: LACTIC ACID, VENOUS: 0.71 mmol/L (ref 0.5–2.2)

## 2013-10-19 MED ORDER — ONDANSETRON HCL 4 MG/2ML IJ SOLN
4.0000 mg | Freq: Once | INTRAMUSCULAR | Status: AC
  Administered 2013-10-19: 4 mg via INTRAVENOUS
  Filled 2013-10-19: qty 2

## 2013-10-19 MED ORDER — IOHEXOL 300 MG/ML  SOLN: 50.0000 mL | Freq: Once | INTRAMUSCULAR | Status: AC | PRN

## 2013-10-19 MED ORDER — IOHEXOL 300 MG/ML  SOLN
100.0000 mL | Freq: Once | INTRAMUSCULAR | Status: AC | PRN
  Administered 2013-10-19: 100 mL via INTRAVENOUS

## 2013-10-19 NOTE — ED Provider Notes (Signed)
CSN: [id number]     Arrival date & time 10/19/13  1920 History   First MD Initiated Contact with Patient 10/19/13 1929     Chief Complaint  Patient presents with  . Abdominal Pain     (Consider location/radiation/quality/duration/timing/severity/associated sxs/prior Treatment) Patient is a 80 y.o. male presenting with abdominal pain. The history is provided by the patient.  Abdominal Pain Pain location:  LLQ and RLQ Pain quality: aching and sharp   Pain radiates to:  Does not radiate Pain severity:  Moderate Onset quality:  Gradual Timing:  Constant Progression:  Unchanged Chronicity:  Recurrent Context: recent illness (recent episode of colitis)   Context: not diet changes, not eating and not sick contacts   Relieved by:  Nothing Worsened by:  Nothing tried Associated symptoms: nausea   Associated symptoms: no chest pain, no cough, no diarrhea, no fever, no shortness of breath and no vomiting     Past Medical History  Diagnosis Date  . Hypertension   . Arthritis     Status post left total replacement 8 2011  . Difficult intubation 1994    surgery had to be  stopped due to injury to "throat"  . Difficult intubation 1994    no trouble since.  Required nasotracheal intubation  '02 [facility]   . Pituitary mass 2000    S/p transphenoidal excision 05/1999 ([facility])  . 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 Hypothyroidism (acquired) 2000  . Anemia, B12 deficiency 2000  . Hyperlipidemia 2003  . Vitamin D deficiency 2009  . History of renal cell carcinoma 1994    Status post right nephrectomy  . BPH (benign prostatic hyperplasia) 2013  . Injury of right rotator cuff 2013    Status post right shoulder arthroplasty April 2013  . Glaucoma 2007    Status post left trabeculectomy 2007  . Blepharitis of both eyes 2006    Status post ectropion surgery bilateral  . GERD (gastroesophageal reflux disease)   . History of acute pancreatitis  1984  . History of colon polyps     Colonoscopy 2001  . Allergic rhinitis     Prior allergy shots 20 years  . Colitis 2014   Past Surgical History  Procedure Laterality Date  . Tonsillectomy    . Eye surgery  over last 6 yrs.      trabeculectomy...   . Eye surgery   cat ext ou  . Brain surgery  2000    pituatary gland removed .  Marland Kitchen Joint replacement  2011    knee left  . Cholecystectomy  1984  . Reverse shoulder arthroplasty  12/15/2011    Procedure: REVERSE SHOULDER ARTHROPLASTY;  Surgeon: Marin Shutter, MD;  Location: [facility];  Service: Orthopedics;  Laterality: Right;  right total reverse shoulder  . Nephrectomy Right 1994    Renal cell  . Transsphenoidal excision pituitary tumor  05/1999    A.Tommi Rumps, M.D.([facility])  . Ectropion surgery Bilateral 2006   Family History  Problem Relation Age of Onset  . Anesthesia problems Neg Hx   . Hypotension Neg Hx   . Malignant hyperthermia Neg Hx   . Pseudochol deficiency Neg Hx    History  Substance Use Topics  . Smoking status: Former Smoker    Types: Cigarettes, Pipe    Quit date: 09/26/1992  . Smokeless tobacco: Never Used  . Alcohol Use: 4.2 oz/week    7 Shots of liquor per week  Review of Systems  Constitutional: Negative for fever.  Respiratory: Negative for cough and shortness of breath.   Cardiovascular: Negative for chest pain and leg swelling.  Gastrointestinal: Positive for nausea and abdominal pain. Negative for vomiting and diarrhea.  All other systems reviewed and are negative.      Allergies  Percocet and Diovan  Home Medications   Current Outpatient Rx  Name  Route  Sig  Dispense  Refill  . co-enzyme Q-10 30 MG capsule   Oral   Take 30 mg by mouth daily.         . cyanocobalamin (,VITAMIN B-12,) 1000 MCG/ML injection   Intramuscular   Inject 1,000 mcg into the muscle every 30 (thirty) days.         Marland Kitchen doxycycline (ADOXA) 50 MG tablet   Oral   Take 50 mg by mouth daily.         .  finasteride (PROSCAR) 5 MG tablet   Oral   Take 5 mg by mouth daily.         . Flaxseed MISC   Does not apply   15 mLs by Does not apply route daily.         Marland Kitchen levothyroxine (SYNTHROID, LEVOTHROID) 88 MCG tablet   Oral   Take 88 mcg by mouth daily.         Marland Kitchen MOVIPREP 100 G SOLR   Oral   Take 1 kit (200 g total) by mouth as directed.   1 kit   0     Dispense as written.   . Multiple Vitamins-Minerals (MULTIVITAMINS THER. W/MINERALS) TABS tablet   Oral   Take 1 tablet by mouth daily.         Marland Kitchen olmesartan (BENICAR) 40 MG tablet   Oral   Take 40 mg by mouth daily.         . Omega-3 Fatty Acids (FISH OIL) 306 MG CAPS   Oral   Take 1 capsule by mouth daily.         . ondansetron (ZOFRAN ODT) 8 MG disintegrating tablet   Oral   Take 1 tablet (8 mg total) by mouth every 8 (eight) hours as needed for nausea or vomiting.   10 tablet   0   . Polyethyl Glycol-Propyl Glycol (SYSTANE OP)   Ophthalmic   Apply 1 drop to eye 4 (four) times daily as needed. For eye dryness         . pravastatin (PRAVACHOL) 80 MG tablet   Oral   Take 80 mg by mouth daily.         . prednisoLONE 5 MG TABS   Oral   Take 2.5-5 mg by mouth. 5 mg in the morning & 2.5 mg at night         . TESTOSTERONE IM   Intramuscular   Inject 0.15 mLs into the muscle every 7 (seven) days. On Fridays         . VITAMIN D, ERGOCALCIFEROL, PO   Oral   Take 1 tablet by mouth daily.          BP 146/65  Pulse 77  Temp(Src) 98.6 F (37 C) (Oral)  Resp 16  SpO2 98% Physical Exam  Nursing note and vitals reviewed. Constitutional: He is oriented to person, place, and time. He appears well-developed and well-nourished. No distress.  HENT:  Head: Normocephalic and atraumatic.  Mouth/Throat: No oropharyngeal exudate.  Eyes: EOM are normal. Pupils are equal, round, and reactive  to light.  Neck: Normal range of motion. Neck supple.  Cardiovascular: Normal rate and regular rhythm.  Exam  reveals no friction rub.   No murmur heard. Pulmonary/Chest: Effort normal and breath sounds normal. No respiratory distress. He has no wheezes. He has no rales.  Abdominal: He exhibits no distension. There is tenderness (diffuse lower abdominal pain, no focal LLQ pain). There is no rebound and no guarding.  Musculoskeletal: Normal range of motion. He exhibits no edema.  Neurological: He is alert and oriented to person, place, and time.  Skin: No rash noted. He is not diaphoretic.    ED Course  Procedures (including critical care time) Labs Review Labs Reviewed  CBC  COMPREHENSIVE METABOLIC PANEL  LIPASE, BLOOD  URINALYSIS, ROUTINE W REFLEX MICROSCOPIC  I-STAT CG4 LACTIC ACID, ED   Imaging Review Ct Abdomen Pelvis W Contrast  10/19/2013   CLINICAL DATA:  Left lower quadrant abdominal pain.  EXAM: CT ABDOMEN AND PELVIS WITH CONTRAST  TECHNIQUE: Multidetector CT imaging of the abdomen and pelvis was performed using the standard protocol following bolus administration of intravenous contrast.  CONTRAST:  162m OMNIPAQUE IOHEXOL 300 MG/ML  SOLN  COMPARISON:  CT of the abdomen and pelvis performed 09/24/2013  FINDINGS: Right basilar atelectasis is noted. Scattered coronary artery calcifications are suggested. A tiny hiatal hernia is seen.  A tiny calcified granuloma is seen within the right hepatic lobe. The liver and spleen are otherwise unremarkable in appearance. The patient is status post cholecystectomy, with clips noted along the gallbladder fossa. The pancreas and adrenal glands are unremarkable.  The patient is status post right-sided nephrectomy. Mild nonspecific left-sided perinephric stranding is noted. Scattered left-sided renal cysts are seen, measuring up to 4.0 cm in size. There is no evidence of hydronephrosis. No renal or ureteral stones are seen.  No free fluid is identified. The small bowel is unremarkable in appearance. The stomach is within normal limits. No acute vascular  abnormalities are seen. Scattered calcification is seen along the abdominal aorta and its branches.  The appendix is normal in caliber and contains air, without evidence for appendicitis. Contrast progresses to the level of the hepatic flexure of the colon. The colon is unremarkable in appearance.  The bladder is mildly distended and grossly unremarkable in appearance. The prostate is significantly enlarged, with diffuse heterogeneity and minimal calcification. An underlying mass cannot be entirely excluded. No inguinal lymphadenopathy is seen.  No acute osseous abnormalities are identified.  IMPRESSION: 1. No focal abnormalities seen to explain the patient's symptoms. 2. The colon is unremarkable in appearance. 3. Tiny hiatal hernia seen. 4. Likely scattered coronary artery calcifications. 5. Right basilar atelectasis noted. 6. Scattered left-sided renal cysts noted. 7. Scattered calcification along the abdominal aorta and its branches. 8. Significantly enlarged prostate, with diffuse heterogeneity and minimal calcification. An underlying mass cannot be entirely excluded. Could correlate with PSA as deemed clinically appropriate.   Electronically Signed   By: JGarald BaldingM.D.   On: 10/19/2013 21:51    EKG Interpretation   None       MDM   Final diagnoses:  Nausea  Malaise    80year old male presents with general malaise, anorexia, lower abdominal pain, nausea. Similar to an episode he had roughly 4 weeks ago where he had colitis. His been on Cipro Flagyl which is finished since then. All this started today. He states he has lost weight. Here doing well, alert and oriented in internal questions appropriately. He is a nursing home resident.  Here abdominal exam shows diffuse lower abdominal pain without focal LLQ pain. Concern for colitis vs. diverticulitis. Will repeat labs and CT scan.  Labs with mild elevation in lipase. CT scan normal. Patient still feeling poorly, patient seen by Dr. Dillard Essex,  who will admit.     Osvaldo Shipper, MD 10/20/13 [phone]

## 2013-10-19 NOTE — ED Notes (Signed)
Pt c/o abdominal pain since January. Nurse at [facility] was afraid pt has diverticulitis. Pt A&O x 4.  Denies N/V.

## 2013-10-19 NOTE — ED Notes (Signed)
Bed: WA07 Expected date: 10/19/13 Expected time: 7:15 PM Means of arrival: Ambulance Comments: abd pain

## 2013-10-19 NOTE — ED Notes (Signed)
Pt has hx of colitis. Was hospitalized in January and sent home. Pt c/o intermittent pain since he was sent home from the hospital. A&Ox4. Pt is scheduled to have a colonoscopy on Wednesday.

## 2013-10-20 ENCOUNTER — Encounter (HOSPITAL_COMMUNITY): Payer: Self-pay | Admitting: *Deleted

## 2013-10-20 DIAGNOSIS — R5381 Other malaise: Secondary | ICD-10-CM

## 2013-10-20 DIAGNOSIS — K859 Acute pancreatitis without necrosis or infection, unspecified: Principal | ICD-10-CM

## 2013-10-20 DIAGNOSIS — R110 Nausea: Secondary | ICD-10-CM

## 2013-10-20 DIAGNOSIS — E869 Volume depletion, unspecified: Secondary | ICD-10-CM | POA: Diagnosis present

## 2013-10-20 DIAGNOSIS — E860 Dehydration: Secondary | ICD-10-CM | POA: Diagnosis present

## 2013-10-20 DIAGNOSIS — I10 Essential (primary) hypertension: Secondary | ICD-10-CM

## 2013-10-20 DIAGNOSIS — R5383 Other fatigue: Secondary | ICD-10-CM

## 2013-10-20 DIAGNOSIS — R109 Unspecified abdominal pain: Secondary | ICD-10-CM | POA: Diagnosis present

## 2013-10-20 DIAGNOSIS — E871 Hypo-osmolality and hyponatremia: Secondary | ICD-10-CM | POA: Diagnosis present

## 2013-10-20 LAB — TROPONIN I

## 2013-10-20 LAB — BASIC METABOLIC PANEL
BUN: 15 mg/dL (ref 6–23)
CO2: 28 meq/L (ref 19–32)
Calcium: 9 mg/dL (ref 8.4–10.5)
Chloride: 93 mEq/L — ABNORMAL LOW (ref 96–112)
Creatinine, Ser: 1.5 mg/dL — ABNORMAL HIGH (ref 0.50–1.35)
GFR calc Af Amer: 46 mL/min — ABNORMAL LOW (ref >90)
GFR, EST NON AFRICAN AMERICAN: 40 mL/min — AB (ref >90)
GLUCOSE: 84 mg/dL (ref 70–99)
Potassium: 4.3 mEq/L (ref 3.7–5.3)
SODIUM: 130 meq/L — AB (ref 137–147)

## 2013-10-20 LAB — LIPASE, BLOOD: Lipase: 64 U/L — ABNORMAL HIGH (ref 11–59)

## 2013-10-20 MED ORDER — OMEGA-3-ACID ETHYL ESTERS 1 G PO CAPS
1.0000 | ORAL_CAPSULE | Freq: Every day | ORAL | Status: DC
  Administered 2013-10-20 – 2013-10-22 (×3): 1 g via ORAL
  Filled 2013-10-20 (×3): qty 1

## 2013-10-20 MED ORDER — PREDNISONE 5 MG PO TABS
5.0000 mg | ORAL_TABLET | Freq: Every day | ORAL | Status: DC
  Administered 2013-10-20 – 2013-10-22 (×3): 5 mg via ORAL
  Filled 2013-10-20 (×3): qty 1

## 2013-10-20 MED ORDER — SODIUM CHLORIDE 0.9 % IV SOLN
INTRAVENOUS | Status: DC
  Administered 2013-10-20 (×3): via INTRAVENOUS

## 2013-10-20 MED ORDER — PANTOPRAZOLE SODIUM 40 MG PO TBEC
40.0000 mg | DELAYED_RELEASE_TABLET | Freq: Every day | ORAL | Status: DC
  Administered 2013-10-20: 40 mg via ORAL
  Filled 2013-10-20: qty 1

## 2013-10-20 MED ORDER — ACETAMINOPHEN 325 MG PO TABS: 650.0000 mg | ORAL_TABLET | Freq: Four times a day (QID) | ORAL | Status: DC | PRN

## 2013-10-20 MED ORDER — LORAZEPAM 2 MG/ML IJ SOLN: 0.5000 mg | INTRAMUSCULAR | Status: DC | PRN

## 2013-10-20 MED ORDER — POLYETHYLENE GLYCOL 3350 17 G PO PACK
17.0000 g | PACK | Freq: Every day | ORAL | Status: DC
  Administered 2013-10-20 – 2013-10-22 (×3): 17 g via ORAL
  Filled 2013-10-20 (×3): qty 1

## 2013-10-20 MED ORDER — LEVOTHYROXINE SODIUM 88 MCG PO TABS
88.0000 ug | ORAL_TABLET | Freq: Every day | ORAL | Status: DC
  Administered 2013-10-20 – 2013-10-22 (×3): 88 ug via ORAL
  Filled 2013-10-20 (×4): qty 1

## 2013-10-20 MED ORDER — ONDANSETRON HCL 4 MG/2ML IJ SOLN
4.0000 mg | Freq: Four times a day (QID) | INTRAMUSCULAR | Status: DC | PRN
  Administered 2013-10-20: 4 mg via INTRAVENOUS
  Filled 2013-10-20: qty 2

## 2013-10-20 MED ORDER — MORPHINE SULFATE 2 MG/ML IJ SOLN: 0.5000 mg | INTRAMUSCULAR | Status: DC | PRN

## 2013-10-20 MED ORDER — ACETAMINOPHEN 650 MG RE SUPP: 650.0000 mg | Freq: Four times a day (QID) | RECTAL | Status: DC | PRN

## 2013-10-20 MED ORDER — ENOXAPARIN SODIUM 40 MG/0.4ML ~~LOC~~ SOLN
40.0000 mg | SUBCUTANEOUS | Status: DC
  Administered 2013-10-20 – 2013-10-22 (×3): 40 mg via SUBCUTANEOUS
  Filled 2013-10-20 (×3): qty 0.4

## 2013-10-20 MED ORDER — PANTOPRAZOLE SODIUM 40 MG IV SOLR
40.0000 mg | Freq: Two times a day (BID) | INTRAVENOUS | Status: DC
  Administered 2013-10-20 – 2013-10-22 (×5): 40 mg via INTRAVENOUS
  Filled 2013-10-20 (×6): qty 40

## 2013-10-20 MED ORDER — VITAMIN B-1 100 MG PO TABS
100.0000 mg | ORAL_TABLET | Freq: Every day | ORAL | Status: DC
  Administered 2013-10-20 – 2013-10-22 (×3): 100 mg via ORAL
  Filled 2013-10-20 (×3): qty 1

## 2013-10-20 MED ORDER — IRBESARTAN 300 MG PO TABS
300.0000 mg | ORAL_TABLET | Freq: Every day | ORAL | Status: DC
  Administered 2013-10-20: 300 mg via ORAL
  Filled 2013-10-20: qty 1

## 2013-10-20 MED ORDER — ONDANSETRON 8 MG PO TBDP
8.0000 mg | ORAL_TABLET | Freq: Three times a day (TID) | ORAL | Status: DC | PRN
  Filled 2013-10-20: qty 1

## 2013-10-20 MED ORDER — VITAMIN B-12 1000 MCG PO TABS
1000.0000 ug | ORAL_TABLET | Freq: Every evening | ORAL | Status: DC
  Administered 2013-10-20 – 2013-10-21 (×2): 1000 ug via ORAL
  Filled 2013-10-20 (×3): qty 1

## 2013-10-20 MED ORDER — SIMVASTATIN 40 MG PO TABS
40.0000 mg | ORAL_TABLET | Freq: Every day | ORAL | Status: DC
  Administered 2013-10-20 – 2013-10-21 (×2): 40 mg via ORAL
  Filled 2013-10-20 (×3): qty 1

## 2013-10-20 MED ORDER — FLEET ENEMA 7-19 GM/118ML RE ENEM: 1.0000 | ENEMA | Freq: Once | RECTAL | Status: DC

## 2013-10-20 MED ORDER — FINASTERIDE 5 MG PO TABS
5.0000 mg | ORAL_TABLET | Freq: Every day | ORAL | Status: DC
  Administered 2013-10-20 – 2013-10-22 (×3): 5 mg via ORAL
  Filled 2013-10-20 (×3): qty 1

## 2013-10-20 MED ORDER — ONDANSETRON HCL 4 MG PO TABS: 4.0000 mg | ORAL_TABLET | Freq: Four times a day (QID) | ORAL | Status: DC | PRN

## 2013-10-20 MED ORDER — ADULT MULTIVITAMIN W/MINERALS CH: 1.0000 | ORAL_TABLET | Freq: Every day | ORAL | Status: DC

## 2013-10-20 MED ORDER — ADULT MULTIVITAMIN W/MINERALS CH
1.0000 | ORAL_TABLET | Freq: Every day | ORAL | Status: DC
  Administered 2013-10-20 – 2013-10-21 (×2): 1 via ORAL
  Filled 2013-10-20 (×3): qty 1

## 2013-10-20 NOTE — Progress Notes (Signed)
Utilization Review Completed.   Stryder Poitra, RN, BSN Nurse Case Manager  

## 2013-10-20 NOTE — Progress Notes (Signed)
[facility] PROGRESS NOTE  [name] [name] YHC:[id number] DOB: [birth year] DOA: 10/19/2013 PCP: [name], MD  Assessment/Plan: 1-Abdominal pain/mild chemical Pancreatitis  -As discussed above, CT scan with no acute abdominal findings, lipase mildly elevated at 68>> he status post cholecystectomy in the past, reports drinking 4.2 ounces of alcohol per week  -Conservative management, IV fluids, pain management follow and advance diet as appropriate. -UA negative for infection.   2-Nausea; Weakness;  -Cycle cardiac enzymes.  -Change protonix to IV BID.   3-Acute vs Chronic renal failure // Hyponatremia/Volume depletion  -Hydrate follow and recheck  -Strict I and O.  Bladder scan.   4-Constipation  -Fleets enema, MiraLAX follow.  -Had a bowel movement today.   4-Hypertension  -Continue outpatient medications  -hold ARB due to renal failure.   5-Hypothyroidism (acquired)  -Continue outpatient medications   6-History of alcohol use  -place on multivitamin thiamine when necessary Ativan and follow      Code Status: Full Code.  Family Communication: care discussed with patient.  Disposition Plan: Remain inpatient.    Consultants:  none  Procedures:  none  Antibiotics: None.   HPI/Subjective: Not feeling well. Relates nausea. No significant abdominal pain.   Objective: Filed Vitals:   10/20/13 0446  BP: 131/70  Pulse: 81  Temp: 98.7 F (37.1 C)  Resp: 18    Intake/Output Summary (Last 24 hours) at 10/20/13 1055 Last data filed at 10/20/13 1011  Gross per 24 hour  Intake 848.75 ml  Output   1025 ml  Net -176.25 ml   Filed Weights   10/20/13 0100  Weight: 79.379 kg (175 lb)    Exam:   General: No distress.   Cardiovascular: S 1, S 2 RRR  Respiratory: CTA  Abdomen: Bs present, soft, NT distended.   Musculoskeletal: no edema.   Data Reviewed: Basic Metabolic Panel:  Recent Labs Lab 10/19/13 2031 10/20/13 0502  NA 131*  130*  K 4.4 4.3  CL 93* 93*  CO2 27 28  GLUCOSE 86 84  BUN 17 15  CREATININE 1.38* 1.50*  CALCIUM 9.4 9.0   Liver Function Tests:  Recent Labs Lab 10/19/13 2031  AST 17  ALT 17  ALKPHOS 49  BILITOT 0.5  PROT 6.0  ALBUMIN 3.1*    Recent Labs Lab 10/19/13 2031 10/20/13 0502  LIPASE 68* 64*   No results found for this basename: AMMONIA,  in the last 168 hours CBC:  Recent Labs Lab 10/19/13 2031  WBC 9.0  HGB 11.5*  HCT 33.6*  MCV 94.6  PLT 273   Cardiac Enzymes: No results found for this basename: CKTOTAL, CKMB, CKMBINDEX, TROPONINI,  in the last 168 hours BNP (last 3 results) No results found for this basename: PROBNP,  in the last 8760 hours CBG: No results found for this basename: GLUCAP,  in the last 168 hours  No results found for this or any previous visit (from the past 240 hour(s)).   Studies: Ct Abdomen Pelvis W Contrast  10/19/2013   CLINICAL DATA:  Left lower quadrant abdominal pain.  EXAM: CT ABDOMEN AND PELVIS WITH CONTRAST  TECHNIQUE: Multidetector CT imaging of the abdomen and pelvis was performed using the standard protocol following bolus administration of intravenous contrast.  CONTRAST:  142mL OMNIPAQUE IOHEXOL 300 MG/ML  SOLN  COMPARISON:  CT of the abdomen and pelvis performed 09/24/2013  FINDINGS: Right basilar atelectasis is noted. Scattered coronary artery calcifications are suggested. A tiny hiatal hernia is seen.  A tiny calcified  granuloma is seen within the right hepatic lobe. The liver and spleen are otherwise unremarkable in appearance. The patient is status post cholecystectomy, with clips noted along the gallbladder fossa. The pancreas and adrenal glands are unremarkable.  The patient is status post right-sided nephrectomy. Mild nonspecific left-sided perinephric stranding is noted. Scattered left-sided renal cysts are seen, measuring up to 4.0 cm in size. There is no evidence of hydronephrosis. No renal or ureteral stones are seen.  No  free fluid is identified. The small bowel is unremarkable in appearance. The stomach is within normal limits. No acute vascular abnormalities are seen. Scattered calcification is seen along the abdominal aorta and its branches.  The appendix is normal in caliber and contains air, without evidence for appendicitis. Contrast progresses to the level of the hepatic flexure of the colon. The colon is unremarkable in appearance.  The bladder is mildly distended and grossly unremarkable in appearance. The prostate is significantly enlarged, with diffuse heterogeneity and minimal calcification. An underlying mass cannot be entirely excluded. No inguinal lymphadenopathy is seen.  No acute osseous abnormalities are identified.  IMPRESSION: 1. No focal abnormalities seen to explain the patient's symptoms. 2. The colon is unremarkable in appearance. 3. Tiny hiatal hernia seen. 4. Likely scattered coronary artery calcifications. 5. Right basilar atelectasis noted. 6. Scattered left-sided renal cysts noted. 7. Scattered calcification along the abdominal aorta and its branches. 8. Significantly enlarged prostate, with diffuse heterogeneity and minimal calcification. An underlying mass cannot be entirely excluded. Could correlate with PSA as deemed clinically appropriate.   Electronically Signed   By: Garald Balding M.D.   On: 10/19/2013 21:51    Scheduled Meds: . enoxaparin (LOVENOX) injection  40 mg Subcutaneous Q24H  . finasteride  5 mg Oral Daily  . irbesartan  300 mg Oral Daily  . levothyroxine  88 mcg Oral QAC breakfast  . multivitamin with minerals  1 tablet Oral Daily  . omega-3 acid ethyl esters  1 capsule Oral Daily  . pantoprazole  40 mg Oral Daily  . polyethylene glycol  17 g Oral Daily  . predniSONE  5 mg Oral Daily  . simvastatin  40 mg Oral q1800  . sodium phosphate  1 enema Rectal Once  . thiamine  100 mg Oral Daily  . vitamin B-12  1,000 mcg Oral QPM   Continuous Infusions: . sodium chloride 75  mL/hr at 10/20/13 0153    Active Problems:   Hypertension   Hypothyroidism (acquired)   Weakness   Hyponatremia   Volume depletion   Abdominal pain   Pancreatitis   Dehydration    Time spent: 35 minutes.     [name]  [facility] Pager [phone]. If 7PM-7AM, please contact night-coverage at [email or web address], password [facility] 10/20/2013, 10:55 AM  LOS: 1 day

## 2013-10-20 NOTE — Evaluation (Signed)
Physical Therapy Evaluation Patient Details Name: [name] [name] MRN: [id number] DOB: 06-30-1923 Today's Date: 10/20/2013 Time: 6979-4801 PT Time Calculation (min): 19 min  PT Assessment / Plan / Recommendation History of Present Illness    HPI: [name] [name] is a 80 y.o. male resident of [facility] with history of medical problems as listed below including hypertension, remote pancreatitis, status post cholecystectomy in the past, hypothyroidism who presents with above complaints. He states that he began having upper abdominal pain this a.m. associated with nausea but no vomiting. He states that he has had decreased by mouth intake for a few days, and has been getting progressively weak.    Clinical Impression  Pt with hx as above presents with functional mobility limited 2* ongoing nausea and generalized weakness.  Prior to admit, pt was very active including 3x wk at the gym.  Pt may need follow up HHPT dependent on acute stay progress.    PT Assessment  Patient needs continued PT services    Follow Up Recommendations  Home health PT;No PT follow up (dependent on acute stay progress)    Does the patient have the potential to tolerate intense rehabilitation      Barriers to Discharge        Equipment Recommendations  Rolling walker with 5" wheels    Recommendations for Other Services     Frequency Min 3X/week    Precautions / Restrictions Precautions Precautions: Fall Restrictions Weight Bearing Restrictions: No   Pertinent Vitals/Pain No specific c/o pain.        Mobility  Bed Mobility Overal bed mobility: Needs Assistance Bed Mobility: Supine to Sit Supine to sit: Min assist;Mod assist General bed mobility comments: cues for sequencing, assist to bring trunk to upright Transfers Overall transfer level:  (NT - pt unable to attempt 2* increasing nausea)    Exercises     PT Diagnosis: Difficulty walking  PT Problem List: Decreased activity  tolerance;Decreased mobility;Decreased knowledge of use of DME PT Treatment Interventions: DME instruction;Gait training;Functional mobility training;Therapeutic activities;Therapeutic exercise;Balance training;Patient/family education     PT Goals(Current goals can be found in the care plan section) Acute Rehab PT Goals Patient Stated Goal: Resume previous active lifestyle PT Goal Formulation: With patient Time For Goal Achievement: 10/28/13 Potential to Achieve Goals: Good  Visit Information  Last PT Received On: 10/20/13 Assistance Needed: +1       Prior Functioning  Home Living Family/patient expects to be discharged to:: Assisted living Additional Comments: Has apt at [facility] Prior Function Level of Independence: Independent Comments: Goes to the gym 3x week Communication Communication: HOH Dominant Hand: Right    Cognition  Cognition Arousal/Alertness: Awake/alert Behavior During Therapy: WFL for tasks assessed/performed Overall Cognitive Status: Within Functional Limits for tasks assessed    Extremity/Trunk Assessment Upper Extremity Assessment Upper Extremity Assessment: Overall WFL for tasks assessed Lower Extremity Assessment Lower Extremity Assessment: Overall WFL for tasks assessed Cervical / Trunk Assessment Cervical / Trunk Assessment: Normal   Balance    End of Session PT - End of Session Activity Tolerance: Other (comment) (nausea) Patient left: in bed;with call bell/phone within reach Nurse Communication: Mobility status;Other (comment) (nausea)  GP     [name] [name] 10/20/2013, 1:28 PM

## 2013-10-20 NOTE — Progress Notes (Signed)
Dr. Nestor Ramp aware via phone of results of bladder scan as well as spontaneous void. No new orders received.

## 2013-10-20 NOTE — H&P (Signed)
[facility] History and Physical  [name] [name] HQI:[id number] DOB: [birth year] DOA: 10/19/2013  Referring physician: EDP PCP: [name], MD   Chief Complaint: Abdominal pain with nausea  HPI: [name] [name] is a 80 y.o. male resident of [facility] with history of medical problems as listed below including hypertension, remote pancreatitis, status post cholecystectomy in the past, hypothyroidism who presents with above complaints. He states that he began having upper abdominal pain this a.m. associated with nausea but no vomiting. He states that he has had decreased by mouth intake for a few days, and has been getting progressively weak. He denies fevers diarrhea melena and no hematochezia. He was seen in the ED and CT scan of the abdomen and pelvis showed no focal/ acute abdominal findings, lipase was elevated at 68, urinalysis negative for infection, sodium 131. He is admitted for further evaluation and management.    Review of Systems The patient denies, fever, weight loss,, vision loss, decreased hearing, hoarseness, chest pain, syncope, dyspnea on exertion, peripheral edema, balance deficits, hemoptysis, melena, hematochezia, severe indigestion/heartburn, hematuria,, suspicious skin lesions, transient blindness, difficulty walking, depression, unusual weight change, abnormal bleeding.   Past Medical History  Diagnosis Date  . Hypertension   . Arthritis     Status post left total replacement 8 2011  . Difficult intubation 1994    surgery had to be  stopped due to injury to "throat"  . Difficult intubation 1994    no trouble since.  Required nasotracheal intubation  '02 [facility]   . Pituitary mass 2000    S/p transphenoidal excision 05/1999 ([facility])  . 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 Hypothyroidism (acquired) 2000  . Anemia, B12 deficiency 2000  . Hyperlipidemia 2003  . Vitamin D deficiency 2009   . History of renal cell carcinoma 1994    Status post right nephrectomy  . BPH (benign prostatic hyperplasia) 2013  . Injury of right rotator cuff 2013    Status post right shoulder arthroplasty April 2013  . Glaucoma 2007    Status post left trabeculectomy 2007  . Blepharitis of both eyes 2006    Status post ectropion surgery bilateral  . GERD (gastroesophageal reflux disease)   . History of acute pancreatitis 1984  . History of colon polyps     Colonoscopy 2001  . Allergic rhinitis     Prior allergy shots 20 years  . Colitis 2014   Past Surgical History  Procedure Laterality Date  . Tonsillectomy    . Eye surgery  over last 6 yrs.      trabeculectomy...   . Eye surgery   cat ext ou  . Brain surgery  2000    pituatary gland removed .  Marland Kitchen Joint replacement  2011    knee left  . Cholecystectomy  1984  . Reverse shoulder arthroplasty  12/15/2011    Procedure: REVERSE SHOULDER ARTHROPLASTY;  Surgeon: Marin Shutter, MD;  Location: [place];  Service: Orthopedics;  Laterality: Right;  right total reverse shoulder  . Nephrectomy Right 1994    Renal cell  . Transsphenoidal excision pituitary tumor  05/1999    A.Tommi Rumps, M.D.([facility])  . Ectropion surgery Bilateral 2006   Social History:  reports that he quit smoking about 21 years ago. His smoking use included Cigarettes and Pipe. He smoked 0.00 packs per day. He has never used smokeless tobacco. He reports that he drinks about 4.2 ounces of alcohol per week.  He reports that he does not use illicit drugs.  Allergies  Allergen Reactions  . Percocet [Oxycodone-Acetaminophen] Other (See Comments)    Just doesn't like it  . Diovan [Valsartan] Rash    Family History  Problem Relation Age of Onset  . Anesthesia problems Neg Hx   . Hypotension Neg Hx   . Malignant hyperthermia Neg Hx   . Pseudochol deficiency Neg Hx      Prior to Admission medications   Medication Sig Start Date End Date Taking? Authorizing Provider  co-enzyme  Q-10 30 MG capsule Take 30 mg by mouth daily.   Yes Historical Provider, MD  finasteride (PROSCAR) 5 MG tablet Take 5 mg by mouth daily.   Yes Historical Provider, MD  Flaxseed MISC 15 mLs by Does not apply route daily.   Yes Historical Provider, MD  levothyroxine (SYNTHROID, LEVOTHROID) 88 MCG tablet Take 88 mcg by mouth daily.   Yes Historical Provider, MD  Multiple Vitamins-Minerals (MULTIVITAMINS THER. W/MINERALS) TABS tablet Take 1 tablet by mouth daily.   Yes Historical Provider, MD  olmesartan (BENICAR) 40 MG tablet Take 40 mg by mouth daily.   Yes Historical Provider, MD  ondansetron (ZOFRAN ODT) 8 MG disintegrating tablet Take 1 tablet (8 mg total) by mouth every 8 (eight) hours as needed for nausea or vomiting. 09/24/13  Yes Hoy Morn, MD  Polyethyl Glycol-Propyl Glycol (SYSTANE OP) Apply 1 drop to eye 4 (four) times daily as needed. For eye dryness   Yes Historical Provider, MD  pravastatin (PRAVACHOL) 80 MG tablet Take 80 mg by mouth daily.   Yes Historical Provider, MD  predniSONE (DELTASONE) 5 MG tablet Take 5 mg by mouth daily.   Yes Historical Provider, MD  TESTOSTERONE IM Inject 0.15 mLs into the muscle every 7 (seven) days. On Fridays   Yes Historical Provider, MD  vitamin B-12 (CYANOCOBALAMIN) 1000 MCG tablet Take 1,000 mcg by mouth every evening.   Yes Historical Provider, MD  VITAMIN D, ERGOCALCIFEROL, PO Take 1 tablet by mouth daily.   Yes Historical Provider, MD  MOVIPREP 100 G SOLR Take 1 kit (200 g total) by mouth as directed. 10/10/13   Amy S Esterwood, PA-C  Omega-3 Fatty Acids (FISH OIL) 306 MG CAPS Take 1 capsule by mouth daily.    Historical Provider, MD   Physical Exam: Filed Vitals:   10/19/13 2343  BP: 147/65  Pulse: 75  Temp: 99 F (37.2 C)  Resp: 16    BP 147/65  Pulse 75  Temp(Src) 99 F (37.2 C) (Oral)  Resp 16  SpO2 94% Constitutional: Vital signs reviewed.  Patient is a well-developed and well-nourished  in no acute distress and cooperative  with exam. Alert and oriented x3.  Head: Normocephalic and atraumatic Mouth: no erythema or exudates, dry MM Eyes: PERRL, EOMI, conjunctivae normal, No scleral icterus.  Neck: Supple, Trachea midline normal ROM, No JVD, mass, thyromegaly, or carotid bruit present.  Cardiovascular: RRR, S1 normal, S2 normal, no MRG, pulses symmetric and intact bilaterally Pulmonary/Chest: normal respiratory effort, CTAB, no wheezes, rales, or rhonchi Abdominal: Soft. Mild upper abdominal tenderness, no rebound, non-distended, bowel sounds are normal, no masses, organomegaly, or guarding present.  GU: no CVA tenderness  extremities: No cyanosis and no edema  Neurological: A&O x3, Strength is normal and symmetric bilaterally, cranial nerve II-XII are grossly intact, no focal motor deficit, sensory intact to light touch bilaterally.  Skin: Warm, dry and intact. No rash, cyanosis, or clubbing.  Psychiatric: Normal mood and  affect.               Labs on Admission:  Basic Metabolic Panel:  Recent Labs Lab 10/19/13 2031  NA 131*  K 4.4  CL 93*  CO2 27  GLUCOSE 86  BUN 17  CREATININE 1.38*  CALCIUM 9.4   Liver Function Tests:  Recent Labs Lab 10/19/13 2031  AST 17  ALT 17  ALKPHOS 49  BILITOT 0.5  PROT 6.0  ALBUMIN 3.1*    Recent Labs Lab 10/19/13 2031  LIPASE 68*   No results found for this basename: AMMONIA,  in the last 168 hours CBC:  Recent Labs Lab 10/19/13 2031  WBC 9.0  HGB 11.5*  HCT 33.6*  MCV 94.6  PLT 273   Cardiac Enzymes: No results found for this basename: CKTOTAL, CKMB, CKMBINDEX, TROPONINI,  in the last 168 hours  BNP (last 3 results) No results found for this basename: PROBNP,  in the last 8760 hours CBG: No results found for this basename: GLUCAP,  in the last 168 hours  Radiological Exams on Admission: Ct Abdomen Pelvis W Contrast  10/19/2013   CLINICAL DATA:  Left lower quadrant abdominal pain.  EXAM: CT ABDOMEN AND PELVIS WITH CONTRAST   TECHNIQUE: Multidetector CT imaging of the abdomen and pelvis was performed using the standard protocol following bolus administration of intravenous contrast.  CONTRAST:  131m OMNIPAQUE IOHEXOL 300 MG/ML  SOLN  COMPARISON:  CT of the abdomen and pelvis performed 09/24/2013  FINDINGS: Right basilar atelectasis is noted. Scattered coronary artery calcifications are suggested. A tiny hiatal hernia is seen.  A tiny calcified granuloma is seen within the right hepatic lobe. The liver and spleen are otherwise unremarkable in appearance. The patient is status post cholecystectomy, with clips noted along the gallbladder fossa. The pancreas and adrenal glands are unremarkable.  The patient is status post right-sided nephrectomy. Mild nonspecific left-sided perinephric stranding is noted. Scattered left-sided renal cysts are seen, measuring up to 4.0 cm in size. There is no evidence of hydronephrosis. No renal or ureteral stones are seen.  No free fluid is identified. The small bowel is unremarkable in appearance. The stomach is within normal limits. No acute vascular abnormalities are seen. Scattered calcification is seen along the abdominal aorta and its branches.  The appendix is normal in caliber and contains air, without evidence for appendicitis. Contrast progresses to the level of the hepatic flexure of the colon. The colon is unremarkable in appearance.  The bladder is mildly distended and grossly unremarkable in appearance. The prostate is significantly enlarged, with diffuse heterogeneity and minimal calcification. An underlying mass cannot be entirely excluded. No inguinal lymphadenopathy is seen.  No acute osseous abnormalities are identified.  IMPRESSION: 1. No focal abnormalities seen to explain the patient's symptoms. 2. The colon is unremarkable in appearance. 3. Tiny hiatal hernia seen. 4. Likely scattered coronary artery calcifications. 5. Right basilar atelectasis noted. 6. Scattered left-sided renal  cysts noted. 7. Scattered calcification along the abdominal aorta and its branches. 8. Significantly enlarged prostate, with diffuse heterogeneity and minimal calcification. An underlying mass cannot be entirely excluded. Could correlate with PSA as deemed clinically appropriate.   Electronically Signed   By: JGarald BaldingM.D.   On: 10/19/2013 21:51     Assessment/Plan Active Problems:     Abdominal pain/mild chemical Pancreatitis -As discussed above, CT scan with no acute abdominal findings, lipase mildly elevated at 68>> he status post cholecystectomy in the past, reports drinking 4.2 ounces of  alcohol per week -Conservative management, IV fluids, pain management follow and advance diet as appropriate  Hyponatremia/Volume depletion -Hydrate follow and recheck Constipation -Fleets enema, MiraLAX follow.   Hypertension -Continue outpatient medications   Hypothyroidism (acquired) -Continue outpatient medications   Weakness   -Consult PT OT follow History of alcohol use -place on multivitamin thiamine when necessary Ativan and follow       Code Status: Full Family Communication: Family at bedside Disposition Plan: Admit for observation  Time spent: >77mns  [facility] Pager [phone]

## 2013-10-21 ENCOUNTER — Inpatient Hospital Stay (HOSPITAL_COMMUNITY): Payer: Medicare Other

## 2013-10-21 ENCOUNTER — Telehealth: Payer: Self-pay

## 2013-10-21 DIAGNOSIS — E860 Dehydration: Secondary | ICD-10-CM

## 2013-10-21 LAB — CBC
HCT: 37.6 % — ABNORMAL LOW (ref 39.0–52.0)
Hemoglobin: 12.7 g/dL — ABNORMAL LOW (ref 13.0–17.0)
MCH: 32 pg (ref 26.0–34.0)
MCHC: 33.8 g/dL (ref 30.0–36.0)
MCV: 94.7 fL (ref 78.0–100.0)
PLATELETS: 254 10*3/uL (ref 150–400)
RBC: 3.97 MIL/uL — AB (ref 4.22–5.81)
RDW: 13.8 % (ref 11.5–15.5)
WBC: 11.6 10*3/uL — AB (ref 4.0–10.5)

## 2013-10-21 LAB — URINE CULTURE
Colony Count: NO GROWTH
Culture: NO GROWTH

## 2013-10-21 LAB — BASIC METABOLIC PANEL
BUN: 13 mg/dL (ref 6–23)
CALCIUM: 9.5 mg/dL (ref 8.4–10.5)
CHLORIDE: 97 meq/L (ref 96–112)
CO2: 23 meq/L (ref 19–32)
Creatinine, Ser: 1.43 mg/dL — ABNORMAL HIGH (ref 0.50–1.35)
GFR calc Af Amer: 49 mL/min — ABNORMAL LOW (ref >90)
GFR calc non Af Amer: 42 mL/min — ABNORMAL LOW (ref >90)
Glucose, Bld: 74 mg/dL (ref 70–99)
Potassium: 4.3 mEq/L (ref 3.7–5.3)
Sodium: 133 mEq/L — ABNORMAL LOW (ref 137–147)

## 2013-10-21 LAB — TROPONIN I: Troponin I: <0.3 ng/mL (ref <0.30)

## 2013-10-21 MED ORDER — HYDRALAZINE HCL 20 MG/ML IJ SOLN
10.0000 mg | Freq: Three times a day (TID) | INTRAMUSCULAR | Status: DC | PRN
  Filled 2013-10-21: qty 0.5

## 2013-10-21 NOTE — Progress Notes (Signed)
Physical Therapy Treatment Patient Details Name: MELQUIADES KOVAR MRN: [id number] DOB: Aug 06, 1923 Today's Date: 10/21/2013 Time: 5643-3295 PT Time Calculation (min): 24 min  PT Assessment / Plan / Recommendation  History of Present Illness DONEVAN BILLER is a 80 y.o. male resident of [facility] with history of medical problems as listed below including hypertension, remote pancreatitis, status post cholecystectomy in the past, hypothyroidism who presents with above complaints. He states that he began having upper abdominal pain this a.m. associated with nausea but no vomiting. He states that he has had decreased by mouth intake for a few days, and has been getting progressively weak.    PT Comments   Progressing, less nausea today  Follow Up Recommendations  Home health PT;No PT follow up     Does the patient have the potential to tolerate intense rehabilitation     Barriers to Discharge        Equipment Recommendations  Rolling walker with 5" wheels    Recommendations for Other Services    Frequency Min 3X/week   Progress towards PT Goals Progress towards PT goals: Progressing toward goals  Plan Current plan remains appropriate    Precautions / Restrictions Precautions Precautions: Fall Restrictions Weight Bearing Restrictions: No   Pertinent Vitals/Pain     Mobility  Bed Mobility Bed Mobility:  (to EOB with nursing) Supine to sit: Min guard;HOB elevated Transfers Overall transfer level: Needs assistance Equipment used: 1 person hand held assist Transfers: Sit to/from Stand Sit to Stand: Min guard General transfer comment: close min guard for safety as pt is unsteady. Ambulation/Gait Ambulation/Gait assistance: Min guard Ambulation Distance (Feet): 180 Feet Assistive device: 1 person hand held assist Gait Pattern/deviations: Step-through pattern General Gait Details: pt with LOB x 3 posteriorly with min/guard recovery    Exercises     PT  Diagnosis:    PT Problem List:   PT Treatment Interventions:     PT Goals (current goals can now be found in the care plan section) Acute Rehab PT Goals Patient Stated Goal: Resume previous active lifestyle Time For Goal Achievement: 10/28/13 Potential to Achieve Goals: Good  Visit Information  Last PT Received On: 10/21/13 Assistance Needed: +1 History of Present Illness: DOYE MONTILLA is a 80 y.o. male resident of [facility] with history of medical problems as listed below including hypertension, remote pancreatitis, status post cholecystectomy in the past, hypothyroidism who presents with above complaints. He states that he began having upper abdominal pain this a.m. associated with nausea but no vomiting. He states that he has had decreased by mouth intake for a few days, and has been getting progressively weak.     Subjective Data  Patient Stated Goal: Resume previous active lifestyle   Cognition  Cognition Arousal/Alertness: Awake/alert Behavior During Therapy: WFL for tasks assessed/performed Overall Cognitive Status: Within Functional Limits for tasks assessed    Balance  General Comments General comments (skin integrity, edema, etc.): min guard assist to stand and brush teeth at the sink. Pt noted to be a little unsteady in standing. More unsteady with turning away from sink.  End of Session PT - End of Session Equipment Utilized During Treatment: Gait belt Activity Tolerance: Patient tolerated treatment well Patient left: in chair;with call bell/phone within reach;with chair alarm set Nurse Communication: Mobility status   GP     [facility] 10/21/2013, 1:31 PM

## 2013-10-21 NOTE — Telephone Encounter (Signed)
I have left a message for the patient that colon is cancelled and asked that he call back when he gets out of the hospital

## 2013-10-21 NOTE — Care Management Note (Signed)
    Page 1 of 1   10/22/2013     2:28:40 PM   CARE MANAGEMENT NOTE 10/22/2013  Patient:  [name] [name], [name] [name]   Account Number:  [id number]  Date Initiated:  10/21/2013  Documentation initiated by:  Sunday Spillers  Subjective/Objective Assessment:   80 yo male admitted with abd pain, weakness, mild pancreatitis. PTA lived at [facility]     Action/Plan:   SNF at [place] Date:  10/23/2013   Anticipated DC Plan:  [place]  In-house referral  Clinical Social Worker      DC Planning Services  CM consult      Choice offered to / List presented to:             Status of service:  Completed, signed off Medicare Important Message given?  NA - LOS <3 / Initial given by admissions (If response is "NO", the following Medicare IM given date fields will be blank) Date Medicare IM given:   Date Additional Medicare IM given:    Discharge Disposition:  [place]  Per UR Regulation:  Reviewed for med. necessity/level of care/duration of stay  If discussed at [place] of Stay Meetings, dates discussed:    Comments:

## 2013-10-21 NOTE — Progress Notes (Signed)
[facility] PROGRESS NOTE  [name] [name] WGN:[id number] DOB: 1922-12-16 DOA: 10/19/2013 PCP: [name] Patricia, MD  Assessment/Plan: 1-Abdominal pain/mild chemical Pancreatitis  -As discussed above, CT scan with no acute abdominal findings, lipase mildly elevated at 68>> he status post cholecystectomy in the past, reports drinking 4.2 ounces of alcohol per week  -Conservative management, IV fluids, pain management follow and advance diet as appropriate. -UA negative for infection.   2-Nausea; Weakness;  -Cycle cardiac enzymes negative. -protonix to IV BID.  -Advance diet.   3-Acute vs Chronic renal failure // Hyponatremia/Volume depletion  -Strict I and O.  -Cr decrease to 1.4. Improved.   4-Constipation  -Fleets enema, MiraLAX follow.  -Had a bowel movement.  4-Hypertension  -hold ARB due to renal failure.   5-Hypothyroidism (acquired)  -Continue outpatient medications   6-History of alcohol use  -place on multivitamin thiamine when necessary Ativan and follow   7-Cough; check chest x ray.    Code Status: Full Code.  Family Communication: care discussed with patient.  Disposition Plan: home in 24 to 48 hours depending on chest x ray results.    Consultants:  none  Procedures:  none  Antibiotics: None.   HPI/Subjective: Feeling better. Tolerating clear diet.   Objective: Filed Vitals:   10/21/13 0621  BP: 159/71  Pulse: 88  Temp: 99.5 F (37.5 C)  Resp: 18    Intake/Output Summary (Last 24 hours) at 10/21/13 1344 Last data filed at 10/21/13 1112  Gross per 24 hour  Intake 2524.17 ml  Output   3150 ml  Net -625.83 ml   Filed Weights   10/20/13 0100  Weight: 79.379 kg (175 lb)    Exam:   General: No distress.   Cardiovascular: S 1, S 2 RRR  Respiratory: CTA  Abdomen: Bs present, soft, NT distended.   Musculoskeletal: no edema.   Data Reviewed: Basic Metabolic Panel:  Recent Labs Lab 10/19/13 2031 10/20/13 0502  10/21/13 0705  NA 131* 130* 133*  K 4.4 4.3 4.3  CL 93* 93* 97  CO2 27 28 23   GLUCOSE 86 84 74  BUN 17 15 13   CREATININE 1.38* 1.50* 1.43*  CALCIUM 9.4 9.0 9.5   Liver Function Tests:  Recent Labs Lab 10/19/13 2031  AST 17  ALT 17  ALKPHOS 49  BILITOT 0.5  PROT 6.0  ALBUMIN 3.1*    Recent Labs Lab 10/19/13 2031 10/20/13 0502  LIPASE 68* 64*   No results found for this basename: AMMONIA,  in the last 168 hours CBC:  Recent Labs Lab 10/19/13 2031 10/21/13 0705  WBC 9.0 11.6*  HGB 11.5* 12.7*  HCT 33.6* 37.6*  MCV 94.6 94.7  PLT 273 254   Cardiac Enzymes:  Recent Labs Lab 10/20/13 1155 10/20/13 1722 10/20/13 2337  TROPONINI <0.30 <0.30 <0.30   BNP (last 3 results) No results found for this basename: PROBNP,  in the last 8760 hours CBG: No results found for this basename: GLUCAP,  in the last 168 hours  No results found for this or any previous visit (from the past 240 hour(s)).   Studies: Ct Abdomen Pelvis W Contrast  10/19/2013   CLINICAL DATA:  Left lower quadrant abdominal pain.  EXAM: CT ABDOMEN AND PELVIS WITH CONTRAST  TECHNIQUE: Multidetector CT imaging of the abdomen and pelvis was performed using the standard protocol following bolus administration of intravenous contrast.  CONTRAST:  126mL OMNIPAQUE IOHEXOL 300 MG/ML  SOLN  COMPARISON:  CT of the abdomen and pelvis performed 09/24/2013  FINDINGS: Right basilar atelectasis is noted. Scattered coronary artery calcifications are suggested. A tiny hiatal hernia is seen.  A tiny calcified granuloma is seen within the right hepatic lobe. The liver and spleen are otherwise unremarkable in appearance. The patient is status post cholecystectomy, with clips noted along the gallbladder fossa. The pancreas and adrenal glands are unremarkable.  The patient is status post right-sided nephrectomy. Mild nonspecific left-sided perinephric stranding is noted. Scattered left-sided renal cysts are seen, measuring up to  4.0 cm in size. There is no evidence of hydronephrosis. No renal or ureteral stones are seen.  No free fluid is identified. The small bowel is unremarkable in appearance. The stomach is within normal limits. No acute vascular abnormalities are seen. Scattered calcification is seen along the abdominal aorta and its branches.  The appendix is normal in caliber and contains air, without evidence for appendicitis. Contrast progresses to the level of the hepatic flexure of the colon. The colon is unremarkable in appearance.  The bladder is mildly distended and grossly unremarkable in appearance. The prostate is significantly enlarged, with diffuse heterogeneity and minimal calcification. An underlying mass cannot be entirely excluded. No inguinal lymphadenopathy is seen.  No acute osseous abnormalities are identified.  IMPRESSION: 1. No focal abnormalities seen to explain the patient's symptoms. 2. The colon is unremarkable in appearance. 3. Tiny hiatal hernia seen. 4. Likely scattered coronary artery calcifications. 5. Right basilar atelectasis noted. 6. Scattered left-sided renal cysts noted. 7. Scattered calcification along the abdominal aorta and its branches. 8. Significantly enlarged prostate, with diffuse heterogeneity and minimal calcification. An underlying mass cannot be entirely excluded. Could correlate with PSA as deemed clinically appropriate.   Electronically Signed   By: [name] [name] M.D.   On: 10/19/2013 21:51    Scheduled Meds: . enoxaparin (LOVENOX) injection  40 mg Subcutaneous Q24H  . finasteride  5 mg Oral Daily  . levothyroxine  88 mcg Oral QAC breakfast  . multivitamin with minerals  1 tablet Oral Daily  . omega-3 acid ethyl esters  1 capsule Oral Daily  . pantoprazole (PROTONIX) IV  40 mg Intravenous Q12H  . polyethylene glycol  17 g Oral Daily  . predniSONE  5 mg Oral Daily  . simvastatin  40 mg Oral q1800  . sodium phosphate  1 enema Rectal Once  . thiamine  100 mg Oral Daily   . vitamin B-12  1,000 mcg Oral QPM   Continuous Infusions: . sodium chloride 100 mL/hr at 10/20/13 2240    Active Problems:   Hypertension   Hypothyroidism (acquired)   Weakness   Hyponatremia   Volume depletion   Abdominal pain   Pancreatitis   Dehydration    Time spent: 30 minutes.     [name] [name] Walder  [facility] Pager [phone]. If 7PM-7AM, please contact night-coverage at [email or web address], password [facility] 10/21/2013, 1:44 PM  LOS: 2 days

## 2013-10-21 NOTE — Evaluation (Signed)
Occupational Therapy Evaluation Patient Details Name: [name] [name] MRN: [id number] DOB: 1923/03/09 Today's Date: 10/21/2013 Time: 1002-1030 OT Time Calculation (min): 28 min  OT Assessment / Plan / Recommendation History of present illness [name] [name] is a 80 y.o. male resident of [facility] with history of medical problems as listed below including hypertension, remote pancreatitis, status post cholecystectomy in the past, hypothyroidism who presents with above complaints. He states that he began having upper abdominal pain this a.m. associated with nausea but no vomiting. He states that he has had decreased by mouth intake for a few days, and has been getting progressively weak.    Clinical Impression   Pt currently performing ADL at min guard to min assist level but states he feels nauseous and fatigued with basic tasks. He will benefit from continued OT services to improve strength and independence with self care tasks for d/c to rehab unit at [facility].     OT Assessment  Patient needs continued OT Services    Follow Up Recommendations  Other (comment) (rehab unit at [facility].)    Barriers to Discharge      Equipment Recommendations  None recommended by OT    Recommendations for Other Services    Frequency  Min 2X/week    Precautions / Restrictions Precautions Precautions: Fall Restrictions Weight Bearing Restrictions: No   Pertinent Vitals/Pain No complaint of pain;     ADL  Eating/Feeding: Independent Where Assessed - Eating/Feeding: Chair Grooming: Performed;Min guard Where Assessed - Grooming: Unsupported standing Upper Body Bathing: Simulated;Chest;Right arm;Left arm;Abdomen;Set up Where Assessed - Upper Body Bathing: Unsupported sitting Lower Body Bathing: Simulated;Minimal assistance Where Assessed - Lower Body Bathing: Supported sit to stand Upper Body Dressing: Simulated;Set up Where Assessed - Upper Body Dressing: Unsupported  sitting Lower Body Dressing: Simulated;Minimal assistance Where Assessed - Lower Body Dressing: Supported sit to stand Toilet Transfer: Simulated;Minimal assistance (chair to sink to bed) Toileting - Clothing Manipulation and Hygiene: Simulated;Min guard Where Assessed - Toileting Clothing Manipulation and Hygiene: Standing ADL Comments: Pt states he just doesnt feel well and feel weak. States he has nausea. He waS recently at the rehab part of [facility] and returned to his independent apartment after several days of therapy in rehab. Discussed recommendation to return to rehab after discharge to help him regain his strength and independence as he currently feels weak and fatigued from limited activity and has slightly decreased balance.     OT Diagnosis: Generalized weakness  OT Problem List: Decreased strength;Decreased knowledge of use of DME or AE OT Treatment Interventions: Self-care/ADL training;Therapeutic activities;Patient/family education;DME and/or AE instruction   OT Goals(Current goals can be found in the care plan section) Acute Rehab OT Goals Patient Stated Goal: Resume previous active lifestyle OT Goal Formulation: With patient Time For Goal Achievement: 11/04/13 Potential to Achieve Goals: Good  Visit Information  Last OT Received On: 10/21/13 Assistance Needed: +1 History of Present Illness: [name] [name] is a 80 y.o. male resident of [facility] with history of medical problems as listed below including hypertension, remote pancreatitis, status post cholecystectomy in the past, hypothyroidism who presents with above complaints. He states that he began having upper abdominal pain this a.m. associated with nausea but no vomiting. He states that he has had decreased by mouth intake for a few days, and has been getting progressively weak.        Prior Functioning     Home Living Family/patient expects to be discharged to:: Other (Comment) (independent  living part of [facility]. Has own apartment.) Additional Comments: Has apt at [place] Prior Function Level of Independence: Independent Comments: Goes to the gym 3x week Communication Communication: HOH Dominant Hand: Right         Vision/Perception Vision - History Baseline Vision: Wears glasses all the time   Cognition  Cognition Arousal/Alertness: Awake/alert Behavior During Therapy: WFL for tasks assessed/performed Overall Cognitive Status: Within Functional Limits for tasks assessed    Extremity/Trunk Assessment Upper Extremity Assessment Upper Extremity Assessment: Generalized weakness     Mobility Bed Mobility Bed Mobility: Sit to Supine Supine to sit: Min guard;HOB elevated Transfers Overall transfer level: Needs assistance Transfers: Sit to/from Stand Sit to Stand: Min guard General transfer comment: close min guard for safety as pt is unsteady.     Exercise     Balance General Comments General comments (skin integrity, edema, etc.): min guard assist to stand and brush teeth at the sink. Pt noted to be a little unsteady in standing. More unsteady with turning away from sink.   End of Session OT - End of Session Activity Tolerance: Patient limited by fatigue Patient left: in bed;with call bell/phone within reach  GO     Jules Schick [phone] 10/21/2013, 10:41 AM

## 2013-10-21 NOTE — Progress Notes (Signed)
Clinical Social Work Department BRIEF PSYCHOSOCIAL ASSESSMENT 10/21/2013  Patient:  DESHANNON, HINCHLIFFE     Account Number:  [id number]     Admit date:  10/19/2013  Clinical Social Worker:  Lacie Scotts  Date/Time:  10/21/2013 12:40 PM  Referred by:  Physician  Date Referred:  10/21/2013 Referred for  SNF Placement   Other Referral:   Interview type:  Patient Other interview type:    PSYCHOSOCIAL DATA Living Status:  FACILITY Admitted from facility:  [facility] Level of care:  Independent Living Primary support name:  Mansoor Hillyard Primary support relationship to patient:  CHILD, ADULT Degree of support available:   unclear    CURRENT CONCERNS Current Concerns  Post-Acute Placement   Other Concerns:    SOCIAL WORK ASSESSMENT / PLAN Pt is a n 80 yr old gentleman living at [facility] prior to hospitalization. CSW met with pt to assist with d/c planning needs. Pt feels he needs " a few days in rehab " prior to returning to his apt. [facility] to [facility] confirmed. CSW will continue to follow pt to assist with d/c planning.   Assessment/plan status:  Psychosocial Support/Ongoing Assessment of Needs Other assessment/ plan:   Information/referral to community resources:   None needed at this time.    PATIENT'S/FAMILY'S RESPONSE TO PLAN OF CARE: Pt c/o of stomach pains. Mood reflects pain issues. Pt hopes he'll start feeling better soon so he can return home.   Werner Lean LCSW [phone]

## 2013-10-21 NOTE — Telephone Encounter (Signed)
Patient left me a voicemail that he has been admitted to [facility] and doesn't want to have colonoscopy at this time.  He is scheduled for a colonoscopy for 10/23/13 with Dr. Fuller Plan at [facility].  I have attempted to reach the patient, he answered the phone, but not coherent.  Discussed with Dr. Fuller Plan, who reviewed CT from 10/20/13 and the colon was normal.  Hepatic flexure abnormality seen on CT in January has resolved.  Per Dr. Fuller Plan cancel outpatient colonoscopy.  If patient desires he can reschedule at at later date.  I will try and reach the patient later today.

## 2013-10-22 DIAGNOSIS — D518 Other vitamin B12 deficiency anemias: Secondary | ICD-10-CM

## 2013-10-22 DIAGNOSIS — R109 Unspecified abdominal pain: Secondary | ICD-10-CM

## 2013-10-22 LAB — CBC
HCT: 32.5 % — ABNORMAL LOW (ref 39.0–52.0)
Hemoglobin: 10.8 g/dL — ABNORMAL LOW (ref 13.0–17.0)
MCH: 31.7 pg (ref 26.0–34.0)
MCHC: 33.2 g/dL (ref 30.0–36.0)
MCV: 95.3 fL (ref 78.0–100.0)
Platelets: 224 10*3/uL (ref 150–400)
RBC: 3.41 MIL/uL — ABNORMAL LOW (ref 4.22–5.81)
RDW: 14 % (ref 11.5–15.5)
WBC: 7.4 10*3/uL (ref 4.0–10.5)

## 2013-10-22 LAB — BASIC METABOLIC PANEL
BUN: 18 mg/dL (ref 6–23)
CHLORIDE: 101 meq/L (ref 96–112)
CO2: 27 meq/L (ref 19–32)
CREATININE: 1.61 mg/dL — AB (ref 0.50–1.35)
Calcium: 9.3 mg/dL (ref 8.4–10.5)
GFR calc Af Amer: 42 mL/min — ABNORMAL LOW (ref >90)
GFR calc non Af Amer: 37 mL/min — ABNORMAL LOW (ref >90)
Glucose, Bld: 146 mg/dL — ABNORMAL HIGH (ref 70–99)
Potassium: 4.4 mEq/L (ref 3.7–5.3)
Sodium: 136 mEq/L — ABNORMAL LOW (ref 137–147)

## 2013-10-22 MED ORDER — PANTOPRAZOLE SODIUM 40 MG PO TBEC: 40.0000 mg | DELAYED_RELEASE_TABLET | Freq: Every day | ORAL | Status: DC

## 2013-10-22 MED ORDER — GUAIFENESIN ER 600 MG PO TB12: 600.0000 mg | ORAL_TABLET | Freq: Two times a day (BID) | ORAL | Status: DC

## 2013-10-22 NOTE — Discharge Summary (Signed)
Physician Discharge Summary  [name] [name]:[id number] DOB: [birth year] DOA: 10/19/2013  PCP: [name], MD  Admit date: 10/19/2013 Discharge date: 10/22/2013  Time spent: 35 minutes  Recommendations for Outpatient Follow-up:  1. Need B-met to follow renal function.  2. Need to follow up with primary gastroenterology.   Discharge Diagnoses:    Pancreatitis   Hypertension   Hypothyroidism (acquired)   Weakness   Hyponatremia   Volume depletion   Abdominal pain    Discharge Condition: Stable.   Diet recommendation: Heart Healthy.   Filed Weights   10/20/13 0100  Weight: 79.379 kg (175 lb)    History of present illness:  [name] [name] is a 80 y.o. male resident of [facility] with history of medical problems as listed below including hypertension, remote pancreatitis, status post cholecystectomy in the past, hypothyroidism who presents with above complaints. He states that he began having upper abdominal pain this a.m. associated with nausea but no vomiting. He states that he has had decreased by mouth intake for a few days, and has been getting progressively weak. He denies fevers diarrhea melena and no hematochezia. He was seen in the ED and CT scan of the abdomen and pelvis showed no focal/ acute abdominal findings, lipase was elevated at 68, urinalysis negative for infection, sodium 131. He is admitted for further evaluation and management.   Hospital Course:  1-Abdominal pain/mild chemical Pancreatitis  -As discussed above, CT scan with no acute abdominal findings, lipase mildly elevated at 68>> he status post cholecystectomy in the past, reports drinking 4.2 ounces of alcohol per week  -Conservative management, IV fluids, pain management follow and advance diet as appropriate.  -UA negative for infection.  -tolerating regular diet.   2-Nausea; Weakness;  -Cycled cardiac enzymes negative.  -protonix.  -Resolved, tolerating diet.   3-Acute  vs Chronic renal failure // Hyponatremia/Volume depletion  -Strict I and O.  -I review records, prior Cr goes from 1.3 to 1.6.  -Cr stable.   4-Constipation  -Fleets enema, MiraLAX follow.  -Had a bowel movement.   4-Hypertension  -hold ARB due to renal failure.   5-Hypothyroidism (acquired)  -Continue outpatient medications   6-History of alcohol use  -place on multivitamin thiamine when necessary Ativan. No evidence of DT.   7-Cough; chest x ray negative for PNA, Normal WBC.    Procedures:  none  Consultations:  None  Discharge Exam: Filed Vitals:   10/22/13 0630  BP: 108/65  Pulse: 71  Temp: 98.5 F (36.9 C)  Resp: 18    General: No distress.  Cardiovascular: S 1, S 2, RRR Respiratory: CTA  Discharge Instructions  Discharge Orders   Future Orders Complete By Expires   Diet - low sodium heart healthy  As directed    Increase activity slowly  As directed        Medication List    STOP taking these medications       olmesartan 40 MG tablet  Commonly known as:  BENICAR      TAKE these medications       co-enzyme Q-10 30 MG capsule  Take 30 mg by mouth daily.     finasteride 5 MG tablet  Commonly known as:  PROSCAR  Take 5 mg by mouth daily.     Fish Oil 306 MG Caps  Take 1 capsule by mouth daily.     Flaxseed Misc  15 mLs by Does not apply route daily.     guaiFENesin 600  MG 12 hr tablet  Commonly known as:  MUCINEX  Take 1 tablet (600 mg total) by mouth 2 (two) times daily.     levothyroxine 88 MCG tablet  Commonly known as:  SYNTHROID, LEVOTHROID  Take 88 mcg by mouth daily.     MOVIPREP 100 G Solr  Generic drug:  peg 3350 powder  Take 1 kit (200 g total) by mouth as directed.     multivitamins ther. w/minerals Tabs tablet  Take 1 tablet by mouth daily.     ondansetron 8 MG disintegrating tablet  Commonly known as:  ZOFRAN ODT  Take 1 tablet (8 mg total) by mouth every 8 (eight) hours as needed for nausea or vomiting.      pantoprazole 40 MG tablet  Commonly known as:  PROTONIX  Take 1 tablet (40 mg total) by mouth daily.     pravastatin 80 MG tablet  Commonly known as:  PRAVACHOL  Take 80 mg by mouth daily.     predniSONE 5 MG tablet  Commonly known as:  DELTASONE  Take 5 mg by mouth daily.     SYSTANE OP  Apply 1 drop to eye 4 (four) times daily as needed. For eye dryness     TESTOSTERONE IM  Inject 0.15 mLs into the muscle every 7 (seven) days. On Fridays     vitamin B-12 1000 MCG tablet  Commonly known as:  CYANOCOBALAMIN  Take 1,000 mcg by mouth every evening.     VITAMIN D (ERGOCALCIFEROL) PO  Take 1 tablet by mouth daily.       Allergies  Allergen Reactions  . Percocet [Oxycodone-Acetaminophen] Other (See Comments)    Just doesn't like it  . Diovan [Valsartan] Rash       Follow-up Information   Follow up with [name],MICHAEL D, MD In 3 days.   Specialty:  Endocrinology   Contact information:   [address] [address] [phone]        The results of significant diagnostics from this hospitalization (including imaging, microbiology, ancillary and laboratory) are listed below for reference.    Significant Diagnostic Studies: Dg Chest 2 View  10/21/2013   CLINICAL DATA:  Cough and congestion.  Hypertension.  EXAM: CHEST  2 VIEW  COMPARISON:  12/08/2011  FINDINGS: Cardiac silhouette is normal in size. The aorta is mildly uncoiled. No mediastinal or hilar masses.  Mild chronic bronchitic change and streaky subsegmental atelectasis is noted at the medial lung bases. The lungs are hyperexpanded but otherwise clear. No pleural effusion or pneumothorax.  Right shoulder prosthesis is well-seated and aligned, new from the prior study. Bony thorax is demineralized.  IMPRESSION: No acute cardiopulmonary disease.   Electronically Signed   By: Lajean Manes M.D.   On: 10/21/2013 15:33   Ct Abdomen Pelvis W Contrast  10/19/2013   CLINICAL DATA:  Left lower quadrant  abdominal pain.  EXAM: CT ABDOMEN AND PELVIS WITH CONTRAST  TECHNIQUE: Multidetector CT imaging of the abdomen and pelvis was performed using the standard protocol following bolus administration of intravenous contrast.  CONTRAST:  182m OMNIPAQUE IOHEXOL 300 MG/ML  SOLN  COMPARISON:  CT of the abdomen and pelvis performed 09/24/2013  FINDINGS: Right basilar atelectasis is noted. Scattered coronary artery calcifications are suggested. A tiny hiatal hernia is seen.  A tiny calcified granuloma is seen within the right hepatic lobe. The liver and spleen are otherwise unremarkable in appearance. The patient is status post cholecystectomy, with clips noted along the gallbladder  fossa. The pancreas and adrenal glands are unremarkable.  The patient is status post right-sided nephrectomy. Mild nonspecific left-sided perinephric stranding is noted. Scattered left-sided renal cysts are seen, measuring up to 4.0 cm in size. There is no evidence of hydronephrosis. No renal or ureteral stones are seen.  No free fluid is identified. The small bowel is unremarkable in appearance. The stomach is within normal limits. No acute vascular abnormalities are seen. Scattered calcification is seen along the abdominal aorta and its branches.  The appendix is normal in caliber and contains air, without evidence for appendicitis. Contrast progresses to the level of the hepatic flexure of the colon. The colon is unremarkable in appearance.  The bladder is mildly distended and grossly unremarkable in appearance. The prostate is significantly enlarged, with diffuse heterogeneity and minimal calcification. An underlying mass cannot be entirely excluded. No inguinal lymphadenopathy is seen.  No acute osseous abnormalities are identified.  IMPRESSION: 1. No focal abnormalities seen to explain the patient's symptoms. 2. The colon is unremarkable in appearance. 3. Tiny hiatal hernia seen. 4. Likely scattered coronary artery calcifications. 5. Right  basilar atelectasis noted. 6. Scattered left-sided renal cysts noted. 7. Scattered calcification along the abdominal aorta and its branches. 8. Significantly enlarged prostate, with diffuse heterogeneity and minimal calcification. An underlying mass cannot be entirely excluded. Could correlate with PSA as deemed clinically appropriate.   Electronically Signed   By: Jeffery  Chang M.D.   On: 10/19/2013 21:51   Ct Abdomen Pelvis W Contrast  09/24/2013   CLINICAL DATA:  Weakness, anorexia, vomiting, rectal bleeding, and left-sided abdominal pain.  EXAM: CT ABDOMEN AND PELVIS WITH CONTRAST  TECHNIQUE: Multidetector CT imaging of the abdomen and pelvis was performed using the standard protocol following bolus administration of intravenous contrast.  CONTRAST:  80mL OMNIPAQUE IOHEXOL 300 MG/ML SOLN, 50mL OMNIPAQUE IOHEXOL 300 MG/ML SOLN  COMPARISON:  None.  FINDINGS: Subsegmental atelectasis is present in the lung bases. There is no pleural effusion.  A punctate calcification is present in the right hepatic lobe. The liver is otherwise unremarkable. The gallbladder is surgically absent. Sequelae of prior right nephrectomy are identified. Right adrenal gland is obscured by adjacent clips. No soft tissue mass is present in the right renal fossa. The left adrenal gland and pancreas are unremarkable. 4.1 cm low-density lower pole left renal lesion is consistent with a cyst. Two 5 mm lesions within the interpolar left kidney are too small to characterize.  There is a small sliding hiatal hernia. There is circumferential wall thickening involving the splenic flexure with adjacent fat stranding. The remainder of the colon is unremarkable. No significant diverticulosis is seen. The appendix is identified in the right lower quadrant and is unremarkable. Small bowel is grossly unremarkable. There is no bowel dilatation.  Prostate is enlarged. No free fluid or enlarged lymph nodes are identified. There is moderate atherosclerotic  calcification of the abdominal aorta. The bladder is unremarkable. Mild degenerative disc disease and facet arthrosis are noted in the lumbar spine.  IMPRESSION: Inflammation involving the splenic flexure of the colon, which could reflect infectious or ischemic colitis.   Electronically Signed   By: Allen  Grady   On: 09/24/2013 13:15    Microbiology: Recent Results (from the past 240 hour(s))  URINE CULTURE     Status: None   Collection Time    10/20/13 12:38 PM      Result Value Ref Range Status   Specimen Description URINE, CLEAN CATCH   Final   Special   Requests NONE   Final   Culture  Setup Time     Final   Value: 10/20/2013 19:05     Performed at [facility]   Colony Count     Final   Value: NO GROWTH     Performed at [facility]   Culture     Final   Value: NO GROWTH     Performed at [facility]   Report Status 10/21/2013 FINAL   Final     Labs: Basic Metabolic Panel:  Recent Labs Lab 10/19/13 2031 10/20/13 0502 10/21/13 0705 10/22/13 0440  NA 131* 130* 133* 136*  K 4.4 4.3 4.3 4.4  CL 93* 93* 97 101  CO2 27 28 23 27  GLUCOSE 86 84 74 146*  BUN 17 15 13 18  CREATININE 1.38* 1.50* 1.43* 1.61*  CALCIUM 9.4 9.0 9.5 9.3   Liver Function Tests:  Recent Labs Lab 10/19/13 2031  AST 17  ALT 17  ALKPHOS 49  BILITOT 0.5  PROT 6.0  ALBUMIN 3.1*    Recent Labs Lab 10/19/13 2031 10/20/13 0502  LIPASE 68* 64*   No results found for this basename: AMMONIA,  in the last 168 hours CBC:  Recent Labs Lab 10/19/13 2031 10/21/13 0705 10/22/13 0440  WBC 9.0 11.6* 7.4  HGB 11.5* 12.7* 10.8*  HCT 33.6* 37.6* 32.5*  MCV 94.6 94.7 95.3  PLT 273 254 224   Cardiac Enzymes:  Recent Labs Lab 10/20/13 1155 10/20/13 1722 10/20/13 2337  TROPONINI <0.30 <0.30 <0.30   BNP: BNP (last 3 results) No results found for this basename: PROBNP,  in the last 8760 hours CBG: No results found for this basename: GLUCAP,  in the last 168  hours     Signed:  REGALADO,BELKYS  [facility] 10/22/2013, 11:32 AM    

## 2013-10-22 NOTE — Progress Notes (Signed)
Pt returned to [facility] ( rehab unit ) today. Facility provided transportation. Pt was in agreement with d/c plan.  Werner Lean LCSW [phone]

## 2013-10-23 ENCOUNTER — Ambulatory Visit (HOSPITAL_COMMUNITY): Admission: RE | Admit: 2013-10-23 | Payer: Medicare Other | Source: Ambulatory Visit | Admitting: Gastroenterology

## 2013-10-23 ENCOUNTER — Encounter (HOSPITAL_COMMUNITY): Admission: RE | Payer: Self-pay | Source: Ambulatory Visit

## 2013-10-23 ENCOUNTER — Non-Acute Institutional Stay (SKILLED_NURSING_FACILITY): Payer: Medicare Other | Admitting: Geriatric Medicine

## 2013-10-23 ENCOUNTER — Encounter: Payer: Self-pay | Admitting: Geriatric Medicine

## 2013-10-23 DIAGNOSIS — K859 Acute pancreatitis without necrosis or infection, unspecified: Secondary | ICD-10-CM

## 2013-10-23 DIAGNOSIS — I10 Essential (primary) hypertension: Secondary | ICD-10-CM

## 2013-10-23 DIAGNOSIS — J00 Acute nasopharyngitis [common cold]: Secondary | ICD-10-CM

## 2013-10-23 SURGERY — COLONOSCOPY
Anesthesia: Moderate Sedation

## 2013-10-23 NOTE — Progress Notes (Signed)
Patient ID: [name] [name]., male   DOB: 03-Nov-1922, 80 y.o.   MRN: [id number]   [facility] [phone])  Code Status: Full Code Contact Information   Name Relation Home Work Mobile   [place] Spouse [phone]     [name], [name] [phone]  [phone]   [name], [name] [phone]     [name], [name] [phone]        Chief Complaint  Patient presents with  . Hospitalization Follow-up    Pancreatitis    HPI: This is a 80 y.o. male resident of [place],  Independent Living  section.  He was discharged from the rehabilitation section at [place] after a stay recovering from colitis. He was admitted to hospital again on February 21 with upper abdominal pain and decreased p.o. intake. He was ultimately diagnosed with mild pancreatitis, treated conservatively with IV fluids and pain management. His condition improved, his diet was advanced as tolerated. He was discharged to the rehabilitation section at [place] yesterday February 24 in good condition. Since his arrival his vital signs have been stable, he is able to eat meals without GI disturbance. He has had little other activity so far.     Allergies  Allergen Reactions  . Percocet [Oxycodone-Acetaminophen] Other (See Comments)    Just doesn't like it  . Diovan [Valsartan] Rash    MEDICATIONS -     Medication List       This list is accurate as of: 10/23/13 11:59 PM.  Always use your most recent med list.               co-enzyme Q-10 30 MG capsule  Take 30 mg by mouth daily.     finasteride 5 MG tablet  Commonly known as:  PROSCAR  Take 5 mg by mouth daily.     Fish Oil 306 MG Caps  Take 1 capsule by mouth daily.     Flaxseed Misc  15 mLs by Does not apply route daily.     guaiFENesin 600 MG 12 hr tablet  Commonly known as:  MUCINEX  Take 1 tablet (600 mg total) by mouth 2 (two) times daily.     levothyroxine 88 MCG tablet  Commonly known as:   SYNTHROID, LEVOTHROID  Take 88 mcg by mouth daily.     MOVIPREP 100 G Solr  Generic drug:  peg 3350 powder  Take 1 kit (200 g total) by mouth as directed.     multivitamins ther. w/minerals Tabs tablet  Take 1 tablet by mouth daily.     ondansetron 8 MG disintegrating tablet  Commonly known as:  ZOFRAN ODT  Take 1 tablet (8 mg total) by mouth every 8 (eight) hours as needed for nausea or vomiting.     pantoprazole 40 MG tablet  Commonly known as:  PROTONIX  Take 1 tablet (40 mg total) by mouth daily.     pravastatin 80 MG tablet  Commonly known as:  PRAVACHOL  Take 80 mg by mouth daily.     predniSONE 5 MG tablet  Commonly known as:  DELTASONE  Take 5 mg by mouth daily.     SYSTANE OP  Apply 1 drop to eye 4 (four) times daily as needed. For eye dryness     TESTOSTERONE IM  Inject 0.15 mLs into the muscle every 7 (seven) days. On Fridays     vitamin B-12 1000 MCG tablet  Commonly known as:  CYANOCOBALAMIN  Take 1,000 mcg by mouth every evening.  VITAMIN D (ERGOCALCIFEROL) PO  Take 1 tablet by mouth daily.         DATA REVIEWED  Radiologic Exams:   10/19/2013  CT ABDOMEN AND PELVIS WITH CONTRAST    COMPARISON:  CT of the abdomen and pelvis performed 09/24/2013   IMPRESSION: 1. No focal abnormalities seen to explain the patient's symptoms. 2. The colon is unremarkable in appearance. 3. Tiny hiatal hernia seen. 4. Likely scattered coronary artery calcifications. 5. Right basilar atelectasis noted. 6. Scattered left-sided renal cysts noted. 7. Scattered calcification along the abdominal aorta and its branches. 8. Significantly enlarged prostate, with diffuse heterogeneity and minimal calcification. An underlying mass cannot be entirely excluded. Could correlate with PSA as deemed clinically appropriate.       10/21/2013  CHEST  2 VIEW    COMPARISON:  12/08/2011 IMPRESSION: No acute cardiopulmonary disease.    Cardiovascular Exams:   Laboratory  Studies: Lab Results  Component Value Date   WBC 7.4 10/22/2013   HGB 10.8* 10/22/2013   HCT 32.5* 10/22/2013   MCV 95.3 10/22/2013   PLT 224 10/22/2013   Lab Results  Component Value Date   NA 136* 10/22/2013   K 4.4 10/22/2013   GLU 77 10/01/2013   BUN 18 10/22/2013   CREATININE 1.61* 10/22/2013   Lab Results  Component Value Date   CALCIUM 9.3 10/22/2013   ALBUMIN 3.1* 10/19/2013   AST 17 10/19/2013   ALT 17 10/19/2013   ALKPHOS 49 10/19/2013   BILITOT 0.5 10/19/2013   GFRNONAA 37* 10/22/2013   GFRAA 42* 10/22/2013   No results found for this basename: vitaminb12, vitamind    REVIEW OF SYSTEMS  DATA OBTAINED: from patient, nurse, medical record GENERAL: Feels "better"   No recent fever, tires easily, appetite improving SKIN: No itch, rash or open wounds EYES: No eye pain, dryness or itching  No change in vision EARS: No earache, tinnitus, change in hearing NOSE: Runny nose MOUTH/THROAT: No mouth or tooth pain  No sore throat   No difficulty chewing or swallowing RESPIRATORY: Mild cough, No wheezing, SOB CARDIAC: No chest pain, palpitations  No edema. GI: No abdominal pain  No nausea, vomiting,diarrhea or constipation  Last BM today.  No heartburn or reflux  GU: No dysuria, frequency or urgency  No change in urine volume or character   No nocturia or change in stream   MUSCULOSKELETAL: No joint pain, swelling or stiffness  No back pain  No muscle ache, pain, weakness  Gait is steady  No recent falls.  NEUROLOGIC: No dizziness, fainting, headache, numbness  No change in mental status.  PSYCHIATRIC: No feelings of anxiety, depression  Sleeps well.  No behavior issue.    PHYSICAL EXAM Filed Vitals:   10/23/13 1651  BP: 136/76  Pulse: 78  Temp: 97.7 F (36.5 C)  Resp: 19  Weight: 177 lb 9.6 oz (80.559 kg)  SpO2: 94%   Body mass index is 24.43 kg/(m^2).  GENERAL APPEARANCE: No acute distress, appropriately groomed, normal body habitus. Alert, pleasant, conversant. SKIN: No  diaphoresis, rash, unusual lesions, wounds HEAD: Normocephalic, atraumatic EYES: Conjunctiva/lids clear. Pupils round, reactive. EOMs intact.  EARS: External exam WNL. Hearing grossly normal. NOSE: No deformity or discharge. MOUTH/THROAT: Lips w/o lesions. Oral mucosa, tongue moist, w/o lesion. Oropharynx w/o redness or lesions.  NECK: Supple, full ROM. No thyroid tenderness, enlargement or nodule LYMPHATICS: No head, neck or supraclavicular adenopathy RESPIRATORY: Breathing is even, unlabored. Lung sounds are clear and full.  CHEST/BREASTS: No chest  deformity.  CARDIOVASCULAR: Heart RRR. No murmur or extra heart sounds  ARTERIAL: No carotid bruit.   VENOUS: No varicosities. No venous stasis skin changes  EDEMA: No peripheral edema.  GASTROINTESTINAL: Abdomen is soft, non-tender, not distended w/ normal bowel sounds. No hepatic or splenic enlargement. No mass, ventral or inguinal hernia. MUSCULOSKELETAL: Moves all extremities with full ROM, strength and tone. Back is without kyphosis, scoliosis or spinal process tenderness. Gait is not assessed today NEUROLOGIC: Oriented to time, place, person. Cranial nerves 2-12 grossly intact, speech clear, no tremor. PSYCHIATRIC: Mood and affect appropriate to situation   ASSESSMENT/PLAN  Hypertension Remains well controlled on current medications  Pancreatitis Mild pancreatitis diagnosed during most recent hospitalization. Patient is recovering, tolerating regular diet. He remains a bit weak, will advance activity as he tolerates.  Common cold When the nose and mild cough. Started during hospitalization. Chest x-ray negative. He has been taking Mucinex, loratadine started last evening. Expect symptoms will resolve in a few days    Family/ staff Communication:    Goals of Care: The patient expects to be ready for discharge home Friday, February 27  Labs/tests ordered:   Follow up: As needed   Mardene Celeste, NP-C [facility] [phone]  10/23/2013

## 2013-10-24 DIAGNOSIS — J00 Acute nasopharyngitis [common cold]: Secondary | ICD-10-CM | POA: Insufficient documentation

## 2013-10-24 NOTE — Assessment & Plan Note (Signed)
Mild pancreatitis diagnosed during most recent hospitalization. Patient is recovering, tolerating regular diet. He remains a bit weak, will advance activity as he tolerates.

## 2013-10-24 NOTE — Assessment & Plan Note (Signed)
Remains well controlled on current medications. 

## 2013-10-24 NOTE — Assessment & Plan Note (Signed)
When the nose and mild cough. Started during hospitalization. Chest x-ray negative. He has been taking Mucinex, loratadine started last evening. Expect symptoms will resolve in a few days

## 2013-10-25 ENCOUNTER — Non-Acute Institutional Stay (SKILLED_NURSING_FACILITY): Payer: Medicare Other | Admitting: Geriatric Medicine

## 2013-10-25 ENCOUNTER — Encounter: Payer: Self-pay | Admitting: Geriatric Medicine

## 2013-10-25 DIAGNOSIS — I10 Essential (primary) hypertension: Secondary | ICD-10-CM

## 2013-10-25 DIAGNOSIS — J00 Acute nasopharyngitis [common cold]: Secondary | ICD-10-CM

## 2013-10-25 DIAGNOSIS — K859 Acute pancreatitis without necrosis or infection, unspecified: Secondary | ICD-10-CM

## 2013-10-25 NOTE — Progress Notes (Signed)
Patient ID: [name] Eiland., male   DOB: 04-22-1923, 80 y.o.   MRN: [id number]   [facility] [phone])  Code Status: Full Code     Contact Information   Name Relation Home Work Mobile   [name] [name] Spouse [phone]     [name] [name], [name] [name] [phone]  [phone]   [name] [name], [name] [name] [phone]     [name] [name], [name] [name] [phone]        Chief Complaint  Patient presents with  . Pancreatitis  . Hypertension    HPI: This is a 80 y.o. male resident of [place], Independent Living  section.  He was discharged from the rehabilitation section at [facility] 10/01/2013 after a stay recovering from colitis. He was admitted to hospital again on February 21 with upper abdominal pain and decreased p.o. Intake, ultimately diagnosed with mild pancreatitis This was treated conservatively with IV fluids and pain management. His condition improved, diet was advanced as tolerated. He was discharged to the rehabilitation section at [facility] February 24 in good condition.  Last visit: Hypertension Remains well controlled on current medications  Pancreatitis Mild pancreatitis diagnosed during most recent hospitalization. Patient is recovering, tolerating regular diet. He remains a bit weak, will advance activity as he tolerates.  Common cold When the nose and mild cough. Started during hospitalization. Chest x-ray negative. He has been taking Mucinex, loratadine started last evening. Expect symptoms will resolve in a few days  Since last visit  patient has continued to eat well, no GI complaints. He has regained independent activity with ADLs, has been ambulating around the unit without difficulty. Benicar was stopped during hospitalization to 2 renal function and hypotension. Blood pressure readings have been mildly elevated. Continues to complain of mild nasal drainage and dry cough.   Allergies  Allergen Reactions  . Percocet  [Oxycodone-Acetaminophen] Other (See Comments)    Just doesn't like it  . Diovan [Valsartan] Rash      Medication List       This list is accurate as of: 10/25/13  4:55 PM.  Always use your most recent med list.               co-enzyme Q-10 30 MG capsule  Take 30 mg by mouth daily.     finasteride 5 MG tablet  Commonly known as:  PROSCAR  Take 5 mg by mouth daily.     Fish Oil 306 MG Caps  Take 1 capsule by mouth daily.     Flaxseed Misc  15 mLs by Does not apply route daily.     guaiFENesin 600 MG 12 hr tablet  Commonly known as:  MUCINEX  Take 1 tablet (600 mg total) by mouth 2 (two) times daily.     levothyroxine 88 MCG tablet  Commonly known as:  SYNTHROID, LEVOTHROID  Take 88 mcg by mouth daily.     MOVIPREP 100 G Solr  Generic drug:  peg 3350 powder  Take 1 kit (200 g total) by mouth as directed.     multivitamins ther. w/minerals Tabs tablet  Take 1 tablet by mouth daily.     olmesartan 40 MG tablet  Commonly known as:  BENICAR  Take 40 mg by mouth daily.  Start taking on:  10/26/2013     ondansetron 8 MG disintegrating tablet  Commonly known as:  ZOFRAN ODT  Take 1 tablet (8 mg total) by mouth every 8 (eight) hours as needed for nausea or vomiting.     pantoprazole 40 MG  tablet  Commonly known as:  PROTONIX  Take 1 tablet (40 mg total) by mouth daily.     pravastatin 80 MG tablet  Commonly known as:  PRAVACHOL  Take 80 mg by mouth daily.     predniSONE 5 MG tablet  Commonly known as:  DELTASONE  Take 5 mg by mouth daily.     SYSTANE OP  Apply 1 drop to eye 4 (four) times daily as needed. For eye dryness     TESTOSTERONE IM  Inject 0.15 mLs into the muscle every 7 (seven) days. On Fridays     vitamin B-12 1000 MCG tablet  Commonly known as:  CYANOCOBALAMIN  Take 1,000 mcg by mouth every evening.     VITAMIN D (ERGOCALCIFEROL) PO  Take 1 tablet by mouth daily.        DATA REVIEWED  Radiologic Exams:   10/19/2013  CT ABDOMEN AND  PELVIS WITH CONTRAST    COMPARISON:  CT of the abdomen and pelvis performed 09/24/2013   IMPRESSION: 1. No focal abnormalities seen to explain the patient's symptoms. 2. The colon is unremarkable in appearance. 3. Tiny hiatal hernia seen. 4. Likely scattered coronary artery calcifications. 5. Right basilar atelectasis noted. 6. Scattered left-sided renal cysts noted. 7. Scattered calcification along the abdominal aorta and its branches. 8. Significantly enlarged prostate, with diffuse heterogeneity and minimal calcification. An underlying mass cannot be entirely excluded. Could correlate with PSA as deemed clinically appropriate.       10/21/2013  CHEST  2 VIEW    COMPARISON:  12/08/2011 IMPRESSION: No acute cardiopulmonary disease.    Cardiovascular Exams:   Laboratory Studies: Lab Results  Component Value Date   WBC 7.4 10/22/2013   HGB 10.8* 10/22/2013   HCT 32.5* 10/22/2013   MCV 95.3 10/22/2013   PLT 224 10/22/2013   Lab Results  Component Value Date   NA 136* 10/22/2013   K 4.4 10/22/2013   GLU 77 10/01/2013   BUN 18 10/22/2013   CREATININE 1.61* 10/22/2013   Lab Results  Component Value Date   CALCIUM 9.3 10/22/2013   ALBUMIN 3.1* 10/19/2013   AST 17 10/19/2013   ALT 17 10/19/2013   ALKPHOS 49 10/19/2013   BILITOT 0.5 10/19/2013   GFRNONAA 37* 10/22/2013   GFRAA 42* 10/22/2013   No results found for this basename: vitaminb12,  vitamind    REVIEW OF SYSTEMS  DATA OBTAINED: from patient, nurse, medical record GENERAL: Feels well, anxious to return home.   No fever, improved appetite and activity SKIN: No itch, rash or open wounds EYES: No eye pain, dryness or itching  No change in vision EARS: No earache, tinnitus, change in hearing NOSE: Runny nose MOUTH/THROAT: No mouth or tooth pain  No sore throat   No difficulty chewing or swallowing RESPIRATORY: Mild cough, No wheezing, SOB CARDIAC: No chest pain, palpitations  No edema. GI: No abdominal pain  No nausea,  vomiting,diarrhea or constipation  Last BM today.  No heartburn or reflux  GU: No dysuria, frequency or urgency  No change in urine volume or character   No nocturia or change in stream   MUSCULOSKELETAL: No joint pain, swelling or stiffness  No back pain  No muscle ache, pain, weakness  Gait is steady  No recent falls.  NEUROLOGIC: No dizziness, fainting, headache, numbness  No change in mental status.  PSYCHIATRIC: No feelings of anxiety, depression  Sleeps well.  No behavior issue.    PHYSICAL EXAM Filed Vitals:  10/25/13 1654  BP: 151/79  Pulse: 76  Temp: 97.9 F (36.6 C)  Resp: 18  Weight: 177 lb 9.6 oz (80.559 kg)  SpO2: 97%   Body mass index is 24.43 kg/(m^2).  GENERAL APPEARANCE: No acute distress, appropriately groomed, normal body habitus. Alert, pleasant, conversant. SKIN: No diaphoresis, rash, unusual lesions, wounds HEAD: Normocephalic, atraumatic EYES: Conjunctiva/lids clear. Pupils round, reactive. EOMs intact.  EARS: External exam WNL. Hearing grossly normal. NOSE: No deformity or discharge. MOUTH/THROAT: Lips w/o lesions. Oral mucosa, tongue moist, w/o lesion. Oropharynx w/o redness or lesions.  NECK: Supple, full ROM. No thyroid tenderness, enlargement or nodule LYMPHATICS: No head, neck or supraclavicular adenopathy RESPIRATORY: Breathing is even, unlabored. Lung sounds are clear and full.  CHEST/BREASTS: No chest deformity.  CARDIOVASCULAR: Heart RRR. No murmur or extra heart sounds  ARTERIAL: No carotid bruit.   VENOUS: No varicosities. No venous stasis skin changes  EDEMA: No peripheral edema.  GASTROINTESTINAL: Abdomen is soft, non-tender, not distended w/ normal bowel sounds. No hepatic or splenic enlargement. No mass, ventral or inguinal hernia. MUSCULOSKELETAL: Moves all extremities with full ROM, strength and tone. Back is without kyphosis, scoliosis or spinal process tenderness. Gait is steady. NEUROLOGIC: Oriented to time, place, person. Cranial  nerves 2-12 grossly intact, speech clear, no tremor. PSYCHIATRIC: Mood and affect appropriate to situation   ASSESSMENT/PLAN:   Discharge to independent living home later today  Hypertension Blood pressure mildly elevated, resume Benicar.  Common cold Continues with mild upper respiratory symptoms. Continue current supportive measures.  Pancreatitis Mild pancreatitis diagnosed during most recent hospitalization. Since arrival in rehabilitation section patient has been tolerating regular diet, and has no GI symptoms. Recommend repeat lab next week. Patient will keep GI follow up as scheduled     Follow up: Recommend followup with PCP, Dr. Eden Emms next week. Keep GI follow up as scheduled.   Lab 3/3:15 CMP, lipase, amylase  Mardene Celeste, NP-C [place] [phone]  10/25/2013

## 2013-10-27 LAB — BASIC METABOLIC PANEL
BUN: 19 mg/dL (ref 4–21)
CREATININE: 1.5 mg/dL — AB (ref 0.6–1.3)
GLUCOSE: 104 mg/dL
POTASSIUM: 5 mmol/L (ref 3.4–5.3)
SODIUM: 134 mmol/L — AB (ref 137–147)

## 2013-10-27 LAB — HEPATIC FUNCTION PANEL
ALT: 18 U/L (ref 10–40)
AST: 18 U/L (ref 14–40)
Alkaline Phosphatase: 47 U/L (ref 25–125)

## 2013-10-27 NOTE — Assessment & Plan Note (Signed)
Blood pressure mildly elevated, resume Benicar.

## 2013-10-27 NOTE — Assessment & Plan Note (Signed)
Mild pancreatitis diagnosed during most recent hospitalization. Since arrival in rehabilitation section patient has been tolerating regular diet, and has no GI symptoms. Recommend repeat lab next week. Patient will keep GI follow up as scheduled

## 2013-10-27 NOTE — Assessment & Plan Note (Signed)
Continues with mild upper respiratory symptoms. Continue current supportive measures.

## 2013-10-28 ENCOUNTER — Non-Acute Institutional Stay (SKILLED_NURSING_FACILITY): Payer: Medicare Other | Admitting: Geriatric Medicine

## 2013-10-28 ENCOUNTER — Encounter: Payer: Self-pay | Admitting: *Deleted

## 2013-10-28 ENCOUNTER — Encounter: Payer: Self-pay | Admitting: Geriatric Medicine

## 2013-10-28 DIAGNOSIS — J189 Pneumonia, unspecified organism: Secondary | ICD-10-CM

## 2013-10-28 DIAGNOSIS — K859 Acute pancreatitis without necrosis or infection, unspecified: Secondary | ICD-10-CM

## 2013-10-28 DIAGNOSIS — I10 Essential (primary) hypertension: Secondary | ICD-10-CM

## 2013-10-28 NOTE — Progress Notes (Signed)
Patient ID: [name] [name]., male   DOB: 08-24-1923, 80 y.o.   MRN: [id number]   [facility] [phone])  Code Status: Full Code Contact Information   Name Relation Home Work Mobile   Shannon Spouse [phone]     Evertt, Chouinard [phone]  [phone]   Shahzain, Kiester [phone]     Jahan, Friedlander [phone]        Chief Complaint  Patient presents with  . nausea/ vomiting  . abnormal CXR    HPI: This is a 80 y.o. male resident of [place], Independent Living  section. He was discharged from the Rehab section 2/27 after recovering from pancreatitis. During Rehab stay he had runny nose and mild cough. No fever, lung sounds were clear.  Yesterday, patient called the nurse and requested readmission to HealthCare section because he was not feeling well. Had not eaten much in 24hrs, took Zofran earlier on the day due to nausea. C/O persistent cough, productive of whitish phlegm. Nursing assessment heard bilateral wheezing. CXR/lab studies were ordered. Levaquin was strarted after CXR returned with evidence of PNA. Patient has been eating/ drinking OK, no GI sx.   Blood pressure has been running low, this AM 90/62.     Allergies  Allergen Reactions  . Percocet [Oxycodone-Acetaminophen] Other (See Comments)    Just doesn't like it  . Diovan [Valsartan] Rash    MEDICATIONS -  Reviewed  DATA REVIEWED  Radiologic Exams  [facility] X-ray 10/27/2013 CXR: Airspace opacity left base, likely c/w pneumonia  Cardiovascular Exams:   Laboratory Studies: Lab Results  Component Value Date   WBC 7.4 10/22/2013   HGB 10.8* 10/22/2013   HCT 32.5* 10/22/2013   MCV 95.3 10/22/2013   PLT 224 10/22/2013   Lab Results  Component Value Date   NA 134* 10/27/2013   K 5.0 10/27/2013   GLU 104 10/27/2013   BUN 19 10/27/2013   CREATININE 1.5* 10/27/2013   Lab Results  Component Value Date   ALBUMIN 3.4 10/27/2013   AST 18 10/27/2013   ALT 18 10/27/2013   ALKPHOS 47 10/27/2013   Lipase 31 10/27/2013   Amylase 46 10/27/2013    REVIEW OF SYSTEMS  DATA OBTAINED: from patient, nurse, medical record, GENERAL: Feels better today. See HPI SKIN: No itch, rash or open wounds EYES: No eye pain, dryness or itching  No change in vision EARS: No earache, tinnitus, change in hearing NOSE: No congestion, drainage or bleeding MOUTH/THROAT: No mouth or tooth pain   No sore throat    No difficulty chewing or swallowing RESPIRATORY: Cough, no wheezing, SOB. See HPI CARDIAC: No chest pain, palpitations  No edema. GI: No abdominal pain  No nausea or vomiting today. No recent diarrhea or constipation  No heartburn or reflux  GU: No dysuria, frequency or urgency  No change in urine volume or character   MUSCULOSKELETAL: No joint pain, swelling or stiffness  No back pain  No muscle ache, pain, weakness  Gait is steady  No recent falls.  NEUROLOGIC: No dizziness, fainting, headache No change in mental status.  PSYCHIATRIC: Expresses anxiety re: general health "why am I not getting better?"  Slept OK last night     PHYSICAL EXAM Filed Vitals:   10/28/13 1826  BP: 90/62  Pulse: 89  Temp: 97 F (36.1 C)  Resp: 16  SpO2: 98%   There is no weight on file to calculate BMI.  GENERAL APPEARANCE: No acute distress, appropriately groomed, normal  body habitus. Alert, pleasant, conversant. SKIN: No diaphoresis, rash,  HEAD: Normocephalic, atraumatic EYES: Conjunctiva/lids clear. Pupils round, reactive.  EARS: Diminished Hearing, chronic. NOSE: No deformity or discharge. MOUTH/THROAT: Lips w/o lesions. Oral mucosa, tongue moist, w/o lesion. Oropharynx w/o redness or lesions.  NECK: Supple, full ROM. No thyroid tenderness, enlargement or nodule LYMPHATICS: No head, neck or supraclavicular adenopathy RESPIRATORY: Breathing is even, unlabored. Lung sounds are clear and full.  CARDIOVASCULAR: Heart RRR. No murmur or extra heart sounds   EDEMA: No  peripheral edema. No ascites GASTROINTESTINAL: Abdomen is soft, non-tender, not distended w/ normal bowel sounds.  MUSCULOSKELETAL: Moves all extremities with full ROM, strength and tone. Back is without kyphosis, scoliosis or spinal process tenderness.  NEUROLOGIC: Oriented to time, place, person. Cranial nerves 2-12 grossly intact, speech clear, no tremor. PSYCHIATRIC: Mood and affect appropriate to situation   ASSESSMENT/PLAN   Pneumonia CXR with opacity LLL, treating with PO Levaquin. Continue Mucinex, IS. Progress activity as he tolerates  Hypertension Benicar restarted 2/28, BP low today. May contribute to nausea. Stop Benicar for now. Monitor BP q shift.  Pancreatitis Lab without sign of pancreatitis. NO abdominal pain, eating/drinking OK today.    Family/ staff Communication:     Labs/tests ordered:     Follow up: As needed  Mardene Celeste, NP-C [facility] [phone]  10/28/2013

## 2013-10-28 NOTE — Assessment & Plan Note (Signed)
Benicar restarted 2/28, BP low today. May contribute to nausea. Stop Benicar for now. Monitor BP q shift.

## 2013-10-28 NOTE — Assessment & Plan Note (Signed)
Lab without sign of pancreatitis. NO abdominal pain, eating/drinking OK today.

## 2013-10-28 NOTE — Progress Notes (Deleted)
Patient ID: [name] [name]., male   DOB: Apr 26, 1923, 80 y.o.   MRN: [id number]   [place] [facility] (31)  Code Status:  @emerg @  Chief Complaint  Patient presents with  . nausea/ vomiting  . abnormal CXR    HPI:       Allergies  Allergen Reactions  . Percocet [Oxycodone-Acetaminophen] Other (See Comments)    Just doesn't like it  . Diovan [Valsartan] Rash    MEDICATIONS -  ***  DATA REVIEWED  Radiologic Exams:   Cardiovascular Exams:   Laboratory Studies: Lab Results  Component Value Date   WBC 7.4 10/22/2013   HGB 10.8* 10/22/2013   HCT 32.5* 10/22/2013   MCV 95.3 10/22/2013   PLT 224 10/22/2013   Lab Results  Component Value Date   NA 136* 10/22/2013   K 4.4 10/22/2013   GLU 77 10/01/2013   BUN 18 10/22/2013   CREATININE 1.61* 10/22/2013   Lab Results  Component Value Date   CALCIUM 9.3 10/22/2013   ALBUMIN 3.1* 10/19/2013   AST 17 10/19/2013   ALT 17 10/19/2013   ALKPHOS 49 10/19/2013   BILITOT 0.5 10/19/2013   GFRNONAA 37* 10/22/2013   GFRAA 42* 10/22/2013   No results found for this basename: vitaminb12, vitamind     REVIEW OF SYSTEMS ***  PHYSICAL EXAM Filed Vitals:   10/28/13 1826  BP: 90/62  Pulse: 89  Temp: 97 F (36.1 C)  Resp: 16  SpO2: 98%   There is no weight on file to calculate BMI. ***  ASSESSMENT/PLAN  No problem-specific assessment & plan notes found for this encounter.   Family/ staff Communication:  ***   Labs/tests ordered: ***   Follow up: No Follow-up on file.  Mardene Celeste, NP-C [place] [phone]  10/28/2013

## 2013-10-28 NOTE — Assessment & Plan Note (Signed)
CXR with opacity LLL, treating with PO Levaquin. Continue Mucinex, IS. Progress activity as he tolerates

## 2013-10-29 ENCOUNTER — Encounter: Payer: Self-pay | Admitting: Gastroenterology

## 2013-10-30 ENCOUNTER — Ambulatory Visit: Payer: Medicare Other | Admitting: Gastroenterology

## 2013-10-31 ENCOUNTER — Encounter: Payer: Self-pay | Admitting: Geriatric Medicine

## 2013-10-31 ENCOUNTER — Non-Acute Institutional Stay: Payer: Medicare Other | Admitting: Geriatric Medicine

## 2013-10-31 DIAGNOSIS — I10 Essential (primary) hypertension: Secondary | ICD-10-CM

## 2013-10-31 DIAGNOSIS — J189 Pneumonia, unspecified organism: Secondary | ICD-10-CM

## 2013-10-31 NOTE — Progress Notes (Signed)
Patient ID: [name] [name]., male   DOB: 1923-04-17, 80 y.o.   MRN: [id number]   [place] [phone])  Code Status: Full Code  Contact Information   Name Relation Home Work Mobile   [place] Spouse [phone]     Tsugio, Elison [phone]  [phone]   Markey, Deady [phone]     Dene, Nazir [phone]         Chief Complaint  Patient presents with  . Pneumonia    HPI: This is a 80 y.o. male resident of [facility]  section. He was discharged from the Rehab section 2/27 after recovering from pancreatitis. During Rehab stay he had runny nose and mild cough. No fever, lung sounds were clear.  10/27/2013 patient called the nurse and requested readmission to HealthCare section because he was not feeling well. Had not eaten much in 24hrs, took Zofran earlier on the day due to nausea. C/O persistent cough, productive of whitish phlegm. Nursing assessment heard bilateral wheezing. CXR/lab studies were ordered. Levaquin was strarted after CXR returned with evidence of PNA. Last visit: Pneumonia CXR with opacity LLL, treating with PO Levaquin. Continue Mucinex, IS. Progress activity as he tolerates  Hypertension Benicar restarted 2/28, BP low today. May contribute to nausea. Stop Benicar for now. Monitor BP q shift.  Pancreatitis Lab without sign of pancreatitis. NO abdominal pain, eating/drinking OK today.  Since last visit patient's general condition is improved, he moved to the assisted living resident apartment for further recovery. Tells me he is feeling better in general, his appetite still is a little bit decreased. He has no GI complaints, feeling a little bit stronger. Continues to cough sputum is clear. Review of facility record shows blood pressure range 106-130/60-71 off Benicar.     Allergies  Allergen Reactions  . Percocet [Oxycodone-Acetaminophen] Other (See Comments)    Just  doesn't like it  . Diovan [Valsartan] Rash    MEDICATIONS -  Reviewed  DATA REVIEWED  Radiologic Exams 10/27/2013 CXR: Airspace opacity left base, likely c/w pneumonia  Cardiovascular Exams:   Laboratory Studies: Lab Results  Component Value Date   WBC 7.4 10/22/2013   HGB 10.8* 10/22/2013   HCT 32.5* 10/22/2013   MCV 95.3 10/22/2013   PLT 224 10/22/2013   Lab Results  Component Value Date   NA 134* 10/27/2013   K 5.0 10/27/2013   GLU 104 10/27/2013   BUN 19 10/27/2013   CREATININE 1.5* 10/27/2013   Lab Results  Component Value Date   CALCIUM 9.3 10/22/2013   ALBUMIN 3.1* 10/19/2013   AST 18 10/27/2013   ALT 18 10/27/2013   ALKPHOS 47 10/27/2013   BILITOT 0.5 10/19/2013   GFRNONAA 37* 10/22/2013   GFRAA 42* 10/22/2013   No results found for this basename: vitaminb12, vitamind     REVIEW OF SYSTEMS  DATA OBTAINED: from patient,  GENERAL: SEE HPI RESPIRATORY: Cough present, No  wheezing, SOB CARDIAC: No chest pain, palpitations. No edema GI: No abdominal pain  No Nausea,vomiting,diarrhea or constipation  No heartburn or reflux  MUSCULOSKELETAL: No joint pain, swelling or stiffness   No back pain    No muscle ache, pain, weakness    Gait is steady    No recent falls  NEUROLOGIC: No dizziness, fainting, headache,  PSYCHIATRIC: No feelings of anxiety, depression  Sleeps well     PHYSICAL EXAM Filed Vitals:   10/31/13 1516  BP: 104/68  Pulse: 89  Temp: 97.7 F (36.5 C)  Resp: 18  SpO2: 97%   There is no weight on file to calculate BMI.  GENERAL APPEARANCE: No acute distress, appropriately groomed, normal body habitus Alert, pleasant, conversant. SKIN: No diaphoresis, rash, wound HEAD: Normocephalic, atraumatic EYES: Conjunctiva/lids clear  RESPIRATORY: Breathing is even, unlabored  Lung sounds are clear and full  CARDIOVASCULAR: Heart RRR   No murmur or extra heart sounds   EDEMA: No peripheral edema   MUSCULOSKELETAL.  Gait is steady PSYCHIATRIC: Mood and affect appropriate  to situation    ASSESSMENT/PLAN  Pneumonia Respiratory status is significantly improved, patient continues to have easy fatigue and a mildly productive cough. Agree with continuing recuperation in the assisted living section.  Hypertension Benicar stopped earlier this week to 2 low blood pressure readings. Blood pressure has remained in a satisfactory range off this medication, continue to monitor. Anticipate we'll need to restart this medication at some point.    Family/ staff Communication:     Labs/tests ordered:    Follow up: Return for As needed.  Mardene Celeste, NP-C [place] [phone]  10/31/2013

## 2013-11-01 NOTE — Assessment & Plan Note (Signed)
Respiratory status is significantly improved, patient continues to have easy fatigue and a mildly productive cough. Agree with continuing recuperation in the assisted living section.

## 2013-11-01 NOTE — Assessment & Plan Note (Signed)
Benicar stopped earlier this week to 2 low blood pressure readings. Blood pressure has remained in a satisfactory range off this medication, continue to monitor. Anticipate we'll need to restart this medication at some point.

## 2013-11-28 ENCOUNTER — Telehealth: Payer: Self-pay | Admitting: Gastroenterology

## 2013-11-28 NOTE — Telephone Encounter (Signed)
Patient was scheduled for a colonoscopy in Feb to follow up sigmoid colon inflammation.  Patient ended up in the hospital with pneumonia in Feb and colon was cancelled.  He had a repeat CT scan that showed colon abnormalities had resolved.  Dr. Fuller Plan said at the time, colon was not necessary unless patient wanted it.  He is c/o abdominal pain, nausea and constipation.  Patient recently discharged from rehab.  He will come in and see Dr. Fuller Plan on 12/04/13 to discuss

## 2013-12-04 ENCOUNTER — Ambulatory Visit (INDEPENDENT_AMBULATORY_CARE_PROVIDER_SITE_OTHER): Payer: Medicare Other | Admitting: Gastroenterology

## 2013-12-04 ENCOUNTER — Encounter: Payer: Self-pay | Admitting: Gastroenterology

## 2013-12-04 VITALS — BP 104/60 | HR 68 | Ht 71.0 in | Wt 179.0 lb

## 2013-12-04 DIAGNOSIS — R933 Abnormal findings on diagnostic imaging of other parts of digestive tract: Secondary | ICD-10-CM

## 2013-12-04 NOTE — Progress Notes (Signed)
    History of Present Illness: This is an 80 year old male evaluated by AE in February for acute colitis involving splenic flexure-ischemia was suspected. He was evaluated in the emergency department in late January. Evaluated by AE in February. Colonoscopy was scheduled however the patient was hospitalized and a repeat CT scan showed complete resolution of colonic abnormality. During that hospitalization he had a minimally elevated lipase however the pancreas was normal on CT imaging. Chemical pancreatitis was given as a diagnosis. He is status post cholecystectomy and his LFTs were normal. I am  Not certain this was pancreatitis. In addition he had pneumonia treated over the past few months. He states he lost about 22 pounds and had a poor appetite over the course of the past several months however his appetite has returned to normal. He is eating well, gaining weight and notes only mild constipation that is relieved by regular use of prune juice. He has no other gastrointestinal complaints he states he feels very well. He would like to avoid going through colonoscopy at all possible.   Current Medications, Allergies, Past Medical History, Past Surgical History, Family History and Social History were reviewed in [employer] record.  Physical Exam: General: Well developed , well nourished, no acute distress Head: Normocephalic and atraumatic Eyes:  sclerae anicteric, EOMI Ears: Normal auditory acuity Mouth: No deformity or lesions Lungs: Clear throughout to auscultation Heart: Regular rate and rhythm; no murmurs, rubs or bruits Abdomen: Soft, non tender and non distended. No masses, hepatosplenomegaly or hernias noted. Normal Bowel sounds Musculoskeletal: Symmetrical with no gross deformities  Pulses:  Normal pulses noted Extremities: No clubbing, cyanosis, edema or deformities noted Neurological: Alert oriented x 4, grossly nonfocal Psychological:  Alert and cooperative.  Normal mood and affect  Assessment and Recommendations:  1. Strongly suspected ischemic colitis. CT abnormalities have completely resolved. There is possibility that the CT scan abnormalities represent another process but it seems unlikely. All his gastrointestinal complaints have resolved except for very mild constipation. It is reasonable to defer colonoscopy at this time and he clearly prefers to defer colonoscopy. If he has recurrent gastrointestinal problems he is advised to contact us. He is advised to maintain very good hydration and to effectively manage his constipation.

## 2014-02-07 ENCOUNTER — Other Ambulatory Visit: Payer: Self-pay | Admitting: Dermatology

## 2014-05-31 ENCOUNTER — Encounter (HOSPITAL_COMMUNITY): Payer: Self-pay | Admitting: Emergency Medicine

## 2014-05-31 ENCOUNTER — Emergency Department (HOSPITAL_COMMUNITY): Payer: Medicare Other

## 2014-05-31 ENCOUNTER — Emergency Department (HOSPITAL_COMMUNITY)
Admission: EM | Admit: 2014-05-31 | Discharge: 2014-05-31 | Disposition: A | Discharge status: Home / Self Care | Payer: Medicare Other | Attending: Emergency Medicine | Admitting: Emergency Medicine

## 2014-05-31 DIAGNOSIS — Z8669 Personal history of other diseases of the nervous system and sense organs: Secondary | ICD-10-CM | POA: Diagnosis not present

## 2014-05-31 DIAGNOSIS — Z87891 Personal history of nicotine dependence: Secondary | ICD-10-CM | POA: Diagnosis not present

## 2014-05-31 DIAGNOSIS — E559 Vitamin D deficiency, unspecified: Secondary | ICD-10-CM | POA: Insufficient documentation

## 2014-05-31 DIAGNOSIS — Z7952 Long term (current) use of systemic steroids: Secondary | ICD-10-CM | POA: Insufficient documentation

## 2014-05-31 DIAGNOSIS — K529 Noninfective gastroenteritis and colitis, unspecified: Secondary | ICD-10-CM | POA: Insufficient documentation

## 2014-05-31 DIAGNOSIS — Z85528 Personal history of other malignant neoplasm of kidney: Secondary | ICD-10-CM | POA: Insufficient documentation

## 2014-05-31 DIAGNOSIS — Z8739 Personal history of other diseases of the musculoskeletal system and connective tissue: Secondary | ICD-10-CM | POA: Diagnosis not present

## 2014-05-31 DIAGNOSIS — Z86010 Personal history of colonic polyps: Secondary | ICD-10-CM | POA: Diagnosis not present

## 2014-05-31 DIAGNOSIS — R112 Nausea with vomiting, unspecified: Secondary | ICD-10-CM | POA: Diagnosis present

## 2014-05-31 DIAGNOSIS — K219 Gastro-esophageal reflux disease without esophagitis: Secondary | ICD-10-CM | POA: Insufficient documentation

## 2014-05-31 DIAGNOSIS — Z87828 Personal history of other (healed) physical injury and trauma: Secondary | ICD-10-CM | POA: Insufficient documentation

## 2014-05-31 DIAGNOSIS — E039 Hypothyroidism, unspecified: Secondary | ICD-10-CM | POA: Insufficient documentation

## 2014-05-31 DIAGNOSIS — E785 Hyperlipidemia, unspecified: Secondary | ICD-10-CM | POA: Insufficient documentation

## 2014-05-31 DIAGNOSIS — Z79899 Other long term (current) drug therapy: Secondary | ICD-10-CM | POA: Insufficient documentation

## 2014-05-31 DIAGNOSIS — I10 Essential (primary) hypertension: Secondary | ICD-10-CM | POA: Insufficient documentation

## 2014-05-31 DIAGNOSIS — Z862 Personal history of diseases of the blood and blood-forming organs and certain disorders involving the immune mechanism: Secondary | ICD-10-CM | POA: Diagnosis not present

## 2014-05-31 LAB — COMPREHENSIVE METABOLIC PANEL
ALT: 12 U/L (ref 0–53)
AST: 16 U/L (ref 0–37)
Albumin: 3.3 g/dL — ABNORMAL LOW (ref 3.5–5.2)
Alkaline Phosphatase: 53 U/L (ref 39–117)
Anion gap: 10 (ref 5–15)
BUN: 28 mg/dL — ABNORMAL HIGH (ref 6–23)
CALCIUM: 9.5 mg/dL (ref 8.4–10.5)
CO2: 28 meq/L (ref 19–32)
CREATININE: 1.52 mg/dL — AB (ref 0.50–1.35)
Chloride: 99 mEq/L (ref 96–112)
GFR calc Af Amer: 45 mL/min — ABNORMAL LOW (ref >90)
GFR, EST NON AFRICAN AMERICAN: 39 mL/min — AB (ref >90)
GLUCOSE: 95 mg/dL (ref 70–99)
Potassium: 4.1 mEq/L (ref 3.7–5.3)
Sodium: 137 mEq/L (ref 137–147)
Total Bilirubin: 0.8 mg/dL (ref 0.3–1.2)
Total Protein: 6.4 g/dL (ref 6.0–8.3)

## 2014-05-31 LAB — CBC WITH DIFFERENTIAL/PLATELET
BASOS PCT: 0 % (ref 0–1)
Basophils Absolute: 0 10*3/uL (ref 0.0–0.1)
EOS PCT: 1 % (ref 0–5)
Eosinophils Absolute: 0.1 10*3/uL (ref 0.0–0.7)
HEMATOCRIT: 42.9 % (ref 39.0–52.0)
HEMOGLOBIN: 14.6 g/dL (ref 13.0–17.0)
LYMPHS ABS: 1.4 10*3/uL (ref 0.7–4.0)
Lymphocytes Relative: 12 % (ref 12–46)
MCH: 33.3 pg (ref 26.0–34.0)
MCHC: 34 g/dL (ref 30.0–36.0)
MCV: 97.9 fL (ref 78.0–100.0)
MONO ABS: 0.8 10*3/uL (ref 0.1–1.0)
MONOS PCT: 7 % (ref 3–12)
NEUTROS ABS: 9.7 10*3/uL — AB (ref 1.7–7.7)
Neutrophils Relative %: 80 % — ABNORMAL HIGH (ref 43–77)
Platelets: 205 10*3/uL (ref 150–400)
RBC: 4.38 MIL/uL (ref 4.22–5.81)
RDW: 14.3 % (ref 11.5–15.5)
WBC: 12 10*3/uL — AB (ref 4.0–10.5)

## 2014-05-31 LAB — LIPASE, BLOOD: LIPASE: 43 U/L (ref 11–59)

## 2014-05-31 LAB — URINALYSIS, ROUTINE W REFLEX MICROSCOPIC
BILIRUBIN URINE: NEGATIVE
GLUCOSE, UA: NEGATIVE mg/dL
Hgb urine dipstick: NEGATIVE
Ketones, ur: NEGATIVE mg/dL
Leukocytes, UA: NEGATIVE
NITRITE: NEGATIVE
PH: 6 (ref 5.0–8.0)
Protein, ur: NEGATIVE mg/dL
Specific Gravity, Urine: 1.014 (ref 1.005–1.030)
Urobilinogen, UA: 0.2 mg/dL (ref 0.0–1.0)

## 2014-05-31 MED ORDER — AMOXICILLIN-POT CLAVULANATE 875-125 MG PO TABS: 1.0000 | ORAL_TABLET | Freq: Two times a day (BID) | ORAL | Status: DC

## 2014-05-31 MED ORDER — ONDANSETRON 4 MG PO TBDP: 4.0000 mg | ORAL_TABLET | Freq: Three times a day (TID) | ORAL | Status: DC | PRN

## 2014-05-31 MED ORDER — SODIUM CHLORIDE 0.9 % IV SOLN
Freq: Once | INTRAVENOUS | Status: AC
  Administered 2014-05-31: 18:00:00 via INTRAVENOUS

## 2014-05-31 MED ORDER — ONDANSETRON HCL 4 MG/2ML IJ SOLN
4.0000 mg | Freq: Once | INTRAMUSCULAR | Status: AC
  Administered 2014-05-31: 4 mg via INTRAVENOUS
  Filled 2014-05-31: qty 2

## 2014-05-31 MED ORDER — ACETAMINOPHEN 500 MG PO TABS
1000.0000 mg | ORAL_TABLET | Freq: Once | ORAL | Status: AC
  Administered 2014-05-31: 1000 mg via ORAL
  Filled 2014-05-31: qty 2

## 2014-05-31 MED ORDER — AMOXICILLIN-POT CLAVULANATE 875-125 MG PO TABS
1.0000 | ORAL_TABLET | Freq: Once | ORAL | Status: AC
  Administered 2014-05-31: 1 via ORAL
  Filled 2014-05-31: qty 1

## 2014-05-31 NOTE — ED Provider Notes (Signed)
CSN: [id number]     Arrival date & time 05/31/14  1523 History   First MD Initiated Contact with Patient 05/31/14 1524     Chief Complaint  Patient presents with  . N/V/D      (Consider location/radiation/quality/duration/timing/severity/associated sxs/prior Treatment) HPI Comments: The patient is an 80 year old male, history of pancreatitis, colitis, hypertension, who presents with a complaint of nausea vomiting and diarrhea. According to the patient and the paramedics he has had approximately 6 episodes of watery diarrhea this morning, nonbloody, associated with multiple episodes of emesis and now frequent dry heaving. Per the patient he goes through this episodically, he does not know of any other sick contacts at his nursing facility, he has no abdominal pain and is unaware that he has a fever. He was given Zofran in the ambulance with some improvement, he denies chest pain, shortness of breath, cough, headache, sore throat, swelling, rash, back pain, dysuria. The symptoms are persistent  The history is provided by the patient.    Past Medical History  Diagnosis Date  . Hypertension   . Arthritis     Status post left total replacement 8 2011  . Difficult intubation 1994    surgery had to be  stopped due to injury to "throat"  . Difficult intubation 1994    no trouble since.  Required nasotracheal intubation  '02 [facility]   . Pituitary mass 2000    S/p transphenoidal excision 05/1999 ([facility])  . 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 Hypothyroidism (acquired) 2000  . Anemia, B12 deficiency 2000  . Hyperlipidemia 2003  . Vitamin D deficiency 2009  . History of renal cell carcinoma 1994    Status post right nephrectomy  . BPH (benign prostatic hyperplasia) 2013  . Injury of right rotator cuff 2013    Status post right shoulder arthroplasty April 2013  . Glaucoma 2007    Status post left trabeculectomy 2007  . Blepharitis of both eyes  2006    Status post ectropion surgery bilateral  . GERD (gastroesophageal reflux disease)   . History of acute pancreatitis 1984  . History of colon polyps     Colonoscopy 2001  . Allergic rhinitis     Prior allergy shots 20 years  . Colitis 2014   Past Surgical History  Procedure Laterality Date  . Tonsillectomy    . Eye surgery  over last 6 yrs.      trabeculectomy...   . Eye surgery   cat ext ou  . Brain surgery  2000    pituatary gland removed .  Marland Kitchen Joint replacement  2011    knee left  . Cholecystectomy  1984  . Reverse shoulder arthroplasty  12/15/2011    Procedure: REVERSE SHOULDER ARTHROPLASTY;  Surgeon: Marin Shutter, MD;  Location: [place];  Service: Orthopedics;  Laterality: Right;  right total reverse shoulder  . Nephrectomy Right 1994    Renal cell  . Transsphenoidal excision pituitary tumor  05/1999    A.Tommi Rumps, M.D.([facility])  . Ectropion surgery Bilateral 2006   Family History  Problem Relation Age of Onset  . Anesthesia problems Neg Hx   . Hypotension Neg Hx   . Malignant hyperthermia Neg Hx   . Pseudochol deficiency Neg Hx   . Lung cancer Father    History  Substance Use Topics  . Smoking status: Former Smoker    Types: Cigarettes, Pipe    Quit date: 09/26/1992  . Smokeless tobacco:  Never Used  . Alcohol Use: 4.2 oz/week    7 Shots of liquor per week    Review of Systems  All other systems reviewed and are negative.     Allergies  Percocet and Diovan  Home Medications   Prior to Admission medications   Medication Sig Start Date End Date Taking? Authorizing Provider  cholecalciferol (VITAMIN D) 1000 UNITS tablet Take 1,000 Units by mouth daily.   Yes Historical Provider, MD  co-enzyme Q-10 30 MG capsule Take 30 mg by mouth daily.   Yes Historical Provider, MD  docusate sodium (COLACE) 100 MG capsule Take 100 mg by mouth daily.   Yes Historical Provider, MD  finasteride (PROSCAR) 5 MG tablet Take 5 mg by mouth daily.   Yes Historical Provider,  MD  Flaxseed MISC 15 mLs by Does not apply route daily.   Yes Historical Provider, MD  levothyroxine (SYNTHROID, LEVOTHROID) 88 MCG tablet Take 88 mcg by mouth daily.   Yes Historical Provider, MD  Multiple Vitamins-Minerals (MULTIVITAMINS THER. W/MINERALS) TABS tablet Take 1 tablet by mouth daily.   Yes Historical Provider, MD  olmesartan (BENICAR) 40 MG tablet Take 40 mg by mouth daily.   Yes Historical Provider, MD  ondansetron (ZOFRAN ODT) 8 MG disintegrating tablet Take 1 tablet (8 mg total) by mouth every 8 (eight) hours as needed for nausea or vomiting. 09/24/13  Yes Hoy Morn, MD  pantoprazole (PROTONIX) 40 MG tablet Take 1 tablet (40 mg total) by mouth daily. 10/22/13  Yes Belkys A Regalado, MD  pravastatin (PRAVACHOL) 80 MG tablet Take 80 mg by mouth daily.   Yes Historical Provider, MD  predniSONE (DELTASONE) 5 MG tablet Take 2.5-5 mg by mouth 2 (two) times daily with a meal. Take 5 mg in the morning and 2.5 in the evening   Yes Historical Provider, MD  Bern 1 drop into both eyes 4 (four) times daily. Serum eye drops mixed with artifical tears. Pt gets [facility]. Dr. Edilia Bo   Yes Historical Provider, MD  TESTOSTERONE IM Inject 0.15 mLs into the muscle every 7 (seven) days. On Fridays   Yes Historical Provider, MD  vitamin B-12 (CYANOCOBALAMIN) 1000 MCG tablet Take 1,000 mcg by mouth every evening.   Yes Historical Provider, MD  amoxicillin-clavulanate (AUGMENTIN) 875-125 MG per tablet Take 1 tablet by mouth every 12 (twelve) hours. 05/31/14   Johnna Acosta, MD  ondansetron (ZOFRAN ODT) 4 MG disintegrating tablet Take 1 tablet (4 mg total) by mouth every 8 (eight) hours as needed for nausea. 05/31/14   Johnna Acosta, MD   BP 128/72  Pulse 72  Temp(Src) 99.6 F (37.6 C) (Oral)  Resp 18  SpO2 96% Physical Exam  Nursing note and vitals reviewed. Constitutional: He appears well-developed and well-nourished. No distress.  HENT:  Head: Normocephalic  and atraumatic.  Mouth/Throat: Oropharynx is clear and moist. No oropharyngeal exudate.  Eyes: Conjunctivae and EOM are normal. Pupils are equal, round, and reactive to light. Right eye exhibits no discharge. Left eye exhibits no discharge. No scleral icterus.  Neck: Normal range of motion. Neck supple. No JVD present. No thyromegaly present.  Cardiovascular: Normal rate, regular rhythm, normal heart sounds and intact distal pulses.  Exam reveals no gallop and no friction rub.   No murmur heard. Pulmonary/Chest: Effort normal and breath sounds normal. No respiratory distress. He has no wheezes. He has no rales.  Abdominal: Soft. Bowel sounds are normal. He exhibits distension. He exhibits no mass.  There is no tenderness.  No tenderness to palpation but there is distention with tympanitic sounds to percussion and increased bowel sounds  Musculoskeletal: Normal range of motion. He exhibits edema (slight edema of the right lower extremity compared to the left). He exhibits no tenderness.  Lymphadenopathy:    He has no cervical adenopathy.  Neurological: He is alert. Coordination normal.  Skin: Skin is warm and dry. No rash noted. No erythema.  Psychiatric: He has a normal mood and affect. His behavior is normal.    ED Course  Procedures (including critical care time) Labs Review Labs Reviewed  CBC WITH DIFFERENTIAL - Abnormal; Notable for the following:    WBC 12.0 (*)    Neutrophils Relative % 80 (*)    Neutro Abs 9.7 (*)    All other components within normal limits  COMPREHENSIVE METABOLIC PANEL - Abnormal; Notable for the following:    BUN 28 (*)    Creatinine, Ser 1.52 (*)    Albumin 3.3 (*)    GFR calc non Af Amer 39 (*)    GFR calc Af Amer 45 (*)    All other components within normal limits  URINALYSIS, ROUTINE W REFLEX MICROSCOPIC - Abnormal; Notable for the following:    APPearance CLOUDY (*)    All other components within normal limits  LIPASE, BLOOD    Imaging Review Ct  Abdomen Pelvis Wo Contrast  05/31/2014   CLINICAL DATA:  Generalized abdominal pain with nausea vomiting and diarrhea ; elevated serum creatinine; history of previous right nephrectomy, cholecystectomy and episodes of acute pancreatitis and colitis.  EXAM: CT ABDOMEN AND PELVIS WITHOUT CONTRAST  TECHNIQUE: Multidetector CT imaging of the abdomen and pelvis was performed following the standard protocol without IV contrast. The patient did receive oral contrast  COMPARISON:  Acute abdominal series of today's date and abdominal and pelvic CT scan of October 19, 2013.  FINDINGS: The orally administered contrast has traversed the stomach and small bowel and reached the right colon. There is no evidence of a small or large bowel obstruction. There are scattered left colonic diverticula without evidence of diverticulitis. The appendix is normal. There is no inguinal nor umbilical hernia.  The right kidney is surgically absent. The left kidney exhibits no evidence of obstruction. There is a 4.3 cm cystic appearing structure in the lower pole with HU value of -5. The prostate gland is enlarged and produces a prominent impression upon the otherwise normal-appearing urinary bladder. The liver, pancreas, spleen, adrenal glands, and abdominal aorta are normal. The gallbladder is surgically absent.  The lumbar spine and bony pelvis exhibit no acute abnormalities. The lung bases exhibit compressive atelectasis. A focal area of confluent density in the posterior costophrenic angle on the right  IMPRESSION: 1. There is no evidence of bowel obstruction, ileus, or acute inflammation. 2. The left kidney exhibits no obstruction. A lower pole cyst is present. The prostate gland is enlarged and produces a moderate impression upon the urinary bladder base. 3. There is no acute hepatobiliary abnormality. The gallbladder is surgically absent. 4. There is stable density in the right posterior costophrenic angle compatible with scarring.    Electronically Signed   By: [name]  Martinique   On: 05/31/2014 19:17   Dg Abd Acute W/chest  05/31/2014   CLINICAL DATA:  Abdominal distention, pain, nausea x1 day with fever. Previous cholecystectomy.  EXAM: ACUTE ABDOMEN SERIES (ABDOMEN 2 VIEW & CHEST 1 VIEW)  COMPARISON:  Chest x-ray 10/21/2013 and CT abdomen/pelvis 10/19/2013  FINDINGS: Lungs are adequately inflated and otherwise clear. There is mild stable cardiomegaly. There is calcified plaque over the thoracic aorta. Right shoulder prosthesis is unchanged. There are degenerative changes of the spine.  Abdominal films demonstrate air throughout the colon. There are several air-filled loops of small bowel within mildly dilated small bowel loop over the left mid abdomen measuring 4.1 cm in diameter. There are a few scattered air-fluid levels. Surgical clips are present over the right upper quadrant and right mid abdomen. There are mild degenerative changes of the spine and hips.  IMPRESSION: A few air-filled loops of small bowel with mild dilatation of a single small bowel loop in the left midabdomen. Findings may be due to focal ileus versus early/ partial small bowel obstructive process. Recommend follow-up serial abdominal films as indicated.  No acute cardiopulmonary disease.   Electronically Signed   By: Marin Olp M.D.   On: 05/31/2014 17:06      MDM   Final diagnoses:  Colitis    Improved after Zofran, vital signs otherwise unremarkable, no hypertension. He does have a fever of 102 raising concern for intra-abdominal pathology but had jaundice, no abdominal tenderness including right upper or right lower quadrant, he wanted imaging, labs, Zofran, fluids.  Laboratory workup confirmed a slight leukocytosis, CT scan of the abdomen revealed no signs of bowel traction or significant abscess or infection, fever has defervesced, patient no longer has nausea and has had no more diarrhea, but her stable for discharge, discussed with family member,  the patient can go to a rehabilitation area of his living facility , this was confirmed with nursing staff at his living facility.  Meds given in ED:  Medications  0.9 %  sodium chloride infusion ( Intravenous Stopped 05/31/14 2047)  ondansetron (ZOFRAN) injection 4 mg (4 mg Intravenous Given 05/31/14 1737)  acetaminophen (TYLENOL) tablet 1,000 mg (1,000 mg Oral Given 05/31/14 1738)  amoxicillin-clavulanate (AUGMENTIN) 875-125 MG per tablet 1 tablet (1 tablet Oral Given 05/31/14 1958)    New Prescriptions   AMOXICILLIN-CLAVULANATE (AUGMENTIN) 875-125 MG PER TABLET    Take 1 tablet by mouth every 12 (twelve) hours.   ONDANSETRON (ZOFRAN ODT) 4 MG DISINTEGRATING TABLET    Take 1 tablet (4 mg total) by mouth every 8 (eight) hours as needed for nausea.      Johnna Acosta, MD 05/31/14 2100

## 2014-05-31 NOTE — ED Notes (Signed)
Bed: WA04 Expected date:  Expected time:  Means of arrival: Ambulance Comments: Nausea Vomiting Diarrhea

## 2014-05-31 NOTE — ED Notes (Signed)
Pt BIB EMS. Pt is from [facility] assisted living. N/V/D since this morning. Diarrhea x 6. Dry heaves now. Not feeling well. Given Zofran 4mg  IVP PTA by EMS. Denies pain. A/o x 4.

## 2014-05-31 NOTE — ED Notes (Signed)
Called [facility] facility to confirm pt's placement to he rehab side per EDP's request, talked to the on duty rehab nurse who assured me that pt would be in fact be on the rehab unit, assured they will be ok taking care of the pt's IV access allowing the ED to discharge pt with the present IV access. MD notified of the call, family notified of the call.

## 2014-05-31 NOTE — Discharge Instructions (Signed)
Please call your doctor for a followup appointment within 24-48 hours. When you talk to your doctor please let them know that you were seen in the emergency department and have them acquire all of your records so that they can discuss the findings with you and formulate a treatment plan to fully care for your new and ongoing problems. ° °

## 2014-06-04 ENCOUNTER — Non-Acute Institutional Stay (SKILLED_NURSING_FACILITY): Payer: Medicare Other | Admitting: Nurse Practitioner

## 2014-06-04 DIAGNOSIS — K529 Noninfective gastroenteritis and colitis, unspecified: Secondary | ICD-10-CM

## 2014-06-04 DIAGNOSIS — R531 Weakness: Secondary | ICD-10-CM

## 2014-06-04 DIAGNOSIS — I10 Essential (primary) hypertension: Secondary | ICD-10-CM

## 2014-06-04 LAB — BASIC METABOLIC PANEL
BUN: 18 mg/dL (ref 4–21)
Creatinine: 1.5 mg/dL — AB (ref 0.6–1.3)
Glucose: 107 mg/dL
POTASSIUM: 4.6 mmol/L (ref 3.4–5.3)
Sodium: 138 mmol/L (ref 137–147)

## 2014-06-04 LAB — CBC AND DIFFERENTIAL
HCT: 38 % — AB (ref 41–53)
Hemoglobin: 12.8 g/dL — AB (ref 13.5–17.5)
PLATELETS: 215 10*3/uL (ref 150–399)
WBC: 9.6 10*3/mL

## 2014-06-04 LAB — HEPATIC FUNCTION PANEL
ALT: 13 U/L (ref 10–40)
AST: 14 U/L (ref 14–40)
Alkaline Phosphatase: 41 U/L (ref 25–125)
Bilirubin, Total: 0.7 mg/dL

## 2014-06-04 NOTE — Progress Notes (Signed)
Patient ID: [name] [name], male   DOB: [birth year], 80 y.o.   MRN: [id number]    Nursing Home Location:  [place] of Service: SNF (31)  PCP: [name], MD  Allergies  Allergen Reactions  . Percocet [Oxycodone-Acetaminophen] Other (See Comments)    Just doesn't like it  . Diovan [Valsartan] Rash    Chief Complaint  Patient presents with  . Discharge Note    HPI:  The patient is an 80 year old male, history of pancreatitis, colitis, hypertension, who went to the ED with a complaint of nausea vomiting and diarrhea. Pt with multiple episodes of watery diarrhea this morning, nonbloody, associated with multiple episodes of emesis and now frequent dry heaving. Pt was diagnosed and treated for acute episode of colitis and was admitted to rehab due to weakness. He is now much stronger and ready to return to Independent living at [facility].   Review of Systems:  Review of Systems  Constitutional: Negative for activity change, appetite change, fatigue and unexpected weight change.  HENT: Negative for congestion and hearing loss.   Eyes: Negative.   Respiratory: Negative for cough and shortness of breath.   Cardiovascular: Negative for chest pain, palpitations and leg swelling.  Gastrointestinal: Negative for nausea, vomiting, abdominal pain, diarrhea, constipation, blood in stool and abdominal distention.  Genitourinary: Negative for dysuria and difficulty urinating.  Musculoskeletal: Negative for arthralgias and myalgias.  Skin: Negative for color change and wound.  Neurological: Negative for dizziness and weakness.  Psychiatric/Behavioral: Negative for behavioral problems, confusion and agitation.    Past Medical History  Diagnosis Date  . Hypertension   . Arthritis     Status post left total replacement 8 2011  . Difficult intubation 1994    surgery had to be  stopped due to injury to "throat"  . Difficult intubation 1994    no  trouble since.  Required nasotracheal intubation  '02 [facility]   . Pituitary mass 2000    S/p transphenoidal excision 05/1999 ([facility])  . 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 Hypothyroidism (acquired) 2000  . Anemia, B12 deficiency 2000  . Hyperlipidemia 2003  . Vitamin D deficiency 2009  . History of renal cell carcinoma 1994    Status post right nephrectomy  . BPH (benign prostatic hyperplasia) 2013  . Injury of right rotator cuff 2013    Status post right shoulder arthroplasty April 2013  . Glaucoma 2007    Status post left trabeculectomy 2007  . Blepharitis of both eyes 2006    Status post ectropion surgery bilateral  . GERD (gastroesophageal reflux disease)   . History of acute pancreatitis 1984  . History of colon polyps     Colonoscopy 2001  . Allergic rhinitis     Prior allergy shots 20 years  . Colitis 2014   Past Surgical History  Procedure Laterality Date  . Tonsillectomy    . Eye surgery  over last 6 yrs.      trabeculectomy...   . Eye surgery   cat ext ou  . Brain surgery  2000    pituatary gland removed .  Marland Kitchen Joint replacement  2011    knee left  . Cholecystectomy  1984  . Reverse shoulder arthroplasty  12/15/2011    Procedure: REVERSE SHOULDER ARTHROPLASTY;  Surgeon: Marin Shutter, MD;  Location: [place];  Service: Orthopedics;  Laterality: Right;  right total reverse shoulder  . Nephrectomy  Right 1994    Renal cell  . Transsphenoidal excision pituitary tumor  05/1999    A.Tommi Rumps, M.D.([facility])  . Ectropion surgery Bilateral 2006   Social History:   reports that he quit smoking about 21 years ago. His smoking use included Cigarettes and Pipe. He smoked 0.00 packs per day. He has never used smokeless tobacco. He reports that he drinks about 4.2 ounces of alcohol per week. He reports that he does not use illicit drugs.  Family History  Problem Relation Age of Onset  . Anesthesia problems Neg Hx   . Hypotension  Neg Hx   . Malignant hyperthermia Neg Hx   . Pseudochol deficiency Neg Hx   . Lung cancer Father     Medications: Patient's Medications  New Prescriptions   No medications on file  Previous Medications   AMOXICILLIN-CLAVULANATE (AUGMENTIN) 875-125 MG PER TABLET    Take 1 tablet by mouth every 12 (twelve) hours.   CHOLECALCIFEROL (VITAMIN D) 1000 UNITS TABLET    Take 1,000 Units by mouth daily.   CO-ENZYME Q-10 30 MG CAPSULE    Take 30 mg by mouth daily.   DOCUSATE SODIUM (COLACE) 100 MG CAPSULE    Take 100 mg by mouth daily.   FINASTERIDE (PROSCAR) 5 MG TABLET    Take 5 mg by mouth daily.   FLAXSEED MISC    15 mLs by Does not apply route daily.   LEVOTHYROXINE (SYNTHROID, LEVOTHROID) 88 MCG TABLET    Take 88 mcg by mouth daily.   MULTIPLE VITAMINS-MINERALS (MULTIVITAMINS THER. W/MINERALS) TABS TABLET    Take 1 tablet by mouth daily.   OLMESARTAN (BENICAR) 40 MG TABLET    Take 40 mg by mouth daily.   ONDANSETRON (ZOFRAN ODT) 4 MG DISINTEGRATING TABLET    Take 1 tablet (4 mg total) by mouth every 8 (eight) hours as needed for nausea.   ONDANSETRON (ZOFRAN ODT) 8 MG DISINTEGRATING TABLET    Take 1 tablet (8 mg total) by mouth every 8 (eight) hours as needed for nausea or vomiting.   PANTOPRAZOLE (PROTONIX) 40 MG TABLET    Take 1 tablet (40 mg total) by mouth daily.   PRAVASTATIN (PRAVACHOL) 80 MG TABLET    Take 80 mg by mouth daily.   PREDNISONE (DELTASONE) 5 MG TABLET    Take 2.5-5 mg by mouth 2 (two) times daily with a meal. Take 5 mg in the morning and 2.5 in the evening   PRESCRIPTION MEDICATION    Place 1 drop into both eyes 4 (four) times daily. Serum eye drops mixed with artifical tears. Pt gets [facility]. Dr. Edilia Bo   TESTOSTERONE IM    Inject 0.15 mLs into the muscle every 7 (seven) days. On Fridays   VITAMIN B-12 (CYANOCOBALAMIN) 1000 MCG TABLET    Take 1,000 mcg by mouth every evening.  Modified Medications   No medications on file  Discontinued Medications   No  medications on file     Physical Exam: Filed Vitals:   06/04/14 1314  BP: 118/69  Pulse: 82  Temp: 97.8 F (36.6 C)  Resp: 18    Physical Exam  Constitutional: He is oriented to person, place, and time. He appears well-developed and well-nourished. No distress.  HENT:  Head: Normocephalic and atraumatic.  Mouth/Throat: Oropharynx is clear and moist. No oropharyngeal exudate.  Eyes: Conjunctivae and EOM are normal. Pupils are equal, round, and reactive to light.  Neck: Normal range of motion. Neck supple.  Cardiovascular: Normal rate,  regular rhythm and normal heart sounds.   Pulmonary/Chest: Effort normal and breath sounds normal.  Abdominal: Soft. Bowel sounds are normal.  Musculoskeletal: He exhibits no edema and no tenderness.  Neurological: He is alert and oriented to person, place, and time.  Skin: Skin is warm and dry. He is not diaphoretic.  Psychiatric: He has a normal mood and affect.    Labs reviewed: Basic Metabolic Panel:  Recent Labs  10/21/13 0705 10/22/13 0440 10/27/13 05/31/14 1647 06/04/14  NA 133* 136* 134* 137 138  K 4.3 4.4 5.0 4.1 4.6  CL 97 101  --  99  --   CO2 23 27  --  28  --   GLUCOSE 74 146*  --  95  --   BUN 13 18 19  28* 18  CREATININE 1.43* 1.61* 1.5* 1.52* 1.5*  CALCIUM 9.5 9.3  --  9.5  --    Liver Function Tests:  Recent Labs  09/24/13 1120 10/19/13 2031 10/27/13 05/31/14 1647 06/04/14  AST 17 17 18 16 14   ALT 15 17 18 12 13   ALKPHOS 53 49 47 53 41  BILITOT 0.9 0.5  --  0.8  --   PROT 6.1 6.0  --  6.4  --   ALBUMIN 3.3* 3.1*  --  3.3*  --     Recent Labs  10/19/13 2031 10/20/13 0502 05/31/14 1647  LIPASE 68* 64* 43   No results found for this basename: AMMONIA,  in the last 8760 hours CBC:  Recent Labs  09/24/13 1120  10/21/13 0705 10/22/13 0440 05/31/14 1647 06/04/14  WBC 18.5*  < > 11.6* 7.4 12.0* 9.6  NEUTROABS 13.5*  --   --   --  9.7*  --   HGB 13.4  < > 12.7* 10.8* 14.6 12.8*  HCT 39.2  < > 37.6*  32.5* 42.9 38*  MCV 96.3  < > 94.7 95.3 97.9  --   PLT 227  < > 254 224 205 215  < > = values in this interval not displayed.   Assessment/Plan 1. Colitis Improved, conts on Augmentin twice daily , already has Rx and is self administering medications. Regular BM this morning.   2. Essential hypertension -Patient is stable; continue current regimen. Will monitor and make changes as necessary.  3. Weakness Has improved, currently back to baseline per pt, plans to go back aquatic center here at [facility].   4. Anemia -worse on recent labs, asymptomatic, updated pt and he will follow up with PCP for ongoing monitoring  pt is stable for discharge back to independent living apartment with medications, follow up as needed Will need to follow up with PCP within 2 weeks

## 2014-06-16 ENCOUNTER — Encounter: Payer: Self-pay | Admitting: *Deleted

## 2014-06-18 ENCOUNTER — Ambulatory Visit (INDEPENDENT_AMBULATORY_CARE_PROVIDER_SITE_OTHER): Payer: Medicare Other | Admitting: Gastroenterology

## 2014-06-18 ENCOUNTER — Encounter: Payer: Self-pay | Admitting: Gastroenterology

## 2014-06-18 VITALS — BP 124/60 | HR 84 | Ht 71.0 in | Wt 190.0 lb

## 2014-06-18 DIAGNOSIS — R197 Diarrhea, unspecified: Secondary | ICD-10-CM

## 2014-06-18 DIAGNOSIS — R112 Nausea with vomiting, unspecified: Secondary | ICD-10-CM

## 2014-06-18 NOTE — Progress Notes (Signed)
    History of Present Illness: This is an 80 year old male seen in the ED for evaluation on 10/3 for acute N/V/D that resolved within several hours.ED records reviewed. Imaging and blood work was essentially unremarkable. He was prescribed a course of Augmentin and Zofran. He has chronic mild constipation.   Current Medications, Allergies, Past Medical History, Past Surgical History, Family History and Social History were reviewed in [employer] record.  Physical Exam: General: Well developed , well nourished, elderly, no acute distress Head: Normocephalic and atraumatic Eyes:  sclerae anicteric, EOMI Ears: Normal auditory acuity Mouth: No deformity or lesions Lungs: Clear throughout to auscultation Heart: Regular rate and rhythm; no murmurs, rubs or bruits Abdomen: Soft, non tender and non distended. No masses, hepatosplenomegaly or hernias noted. Normal Bowel sounds Musculoskeletal: Symmetrical with no gross deformities  Pulses:  Normal pulses noted Extremities: No clubbing, cyanosis, edema or deformities noted Neurological: Alert oriented x 4, grossly nonfocal Psychological:  Alert and cooperative. Normal mood and affect  Assessment and Recommendations:  1. Self limited N/V/D. Presumed gastroenteritis. No further evaluation needed.   2. Mild chronic constipation. Continue Colace and prune juice daily. No routine GI follow up needed. Follow up with PCP.

## 2014-06-18 NOTE — Patient Instructions (Signed)
Please follow up with Dr Fuller Plan as needed.  CC: Lyndal Pulley, MD

## 2015-01-22 ENCOUNTER — Ambulatory Visit: Payer: Self-pay | Admitting: Orthopedic Surgery

## 2015-01-22 NOTE — Progress Notes (Signed)
Preoperative surgical orders have been place into the Epic hospital system for [employer] on 01/22/2015, 7:53 AM  by Mickel Crow for surgery on 02-06-2015.  Preop Knee Scope orders including IV Tylenol and IV Decadron as long as there are no contraindications to the above medications. Arlee Muslim, PA-C

## 2015-02-03 ENCOUNTER — Encounter (HOSPITAL_COMMUNITY): Payer: Self-pay

## 2015-02-03 NOTE — Patient Instructions (Addendum)
[name] [name]  02/03/2015   Your procedure is scheduled on: 02/06/2015    Report to [facility] Main  Entrance and follow signs to               [place] at     100pm.  Call this number if you have problems the morning of surgery [phone]   Remember: ONLY 1 PERSON MAY GO WITH YOU TO SHORT STAY TO GET  READY MORNING OF [place].  Do not eat food after midnite. May have clear liquids until 0830am then nothing by mouth.       Take these medicines the morning of surgery with A SIP OF WATER:    Synthroid , eye drops , finasteride                                You may not have any metal on your body including hair pins and              piercings  Do not wear jewelry, , lotions, powders or perfumes, deodorant                       Men may shave face and neck.   Do not bring valuables to the hospital. [place].  Contacts, dentures or bridgework may not be worn into surgery.      Patients discharged the day of surgery will not be allowed to drive home.  Name and phone number of your driver:  Special Instructions: coughing and deep breathing exercises, leg exercises               Please read over the following fact sheets you were given: _____________________________________________________________________             [facility] - Preparing for Surgery Before surgery, you can play an important role.  Because skin is not sterile, your skin needs to be as free of germs as possible.  You can reduce the number of germs on your skin by washing with CHG (chlorahexidine gluconate) soap before surgery.  CHG is an antiseptic cleaner which kills germs and bonds with the skin to continue killing germs even after washing. Please DO NOT use if you have an allergy to CHG or antibacterial soaps.  If your skin becomes reddened/irritated stop using the CHG and inform your nurse when you arrive at Short Stay. Do  not shave (including legs and underarms) for at least 48 hours prior to the first CHG shower.  You may shave your face/neck. Please follow these instructions carefully:  1.  Shower with CHG Soap the night before surgery and the  morning of Surgery.  2.  If you choose to wash your hair, wash your hair first as usual with your  normal  shampoo.  3.  After you shampoo, rinse your hair and body thoroughly to remove the  shampoo.                           4.  Use CHG as you would any other liquid soap.  You can apply chg directly  to the skin and wash  Gently with a scrungie or clean washcloth.  5.  Apply the CHG Soap to your body ONLY FROM THE NECK DOWN.   Do not use on face/ open                           Wound or open sores. Avoid contact with eyes, ears mouth and genitals (private parts).                       Wash face,  Genitals (private parts) with your normal soap.             6.  Wash thoroughly, paying special attention to the area where your surgery  will be performed.  7.  Thoroughly rinse your body with warm water from the neck down.  8.  DO NOT shower/wash with your normal soap after using and rinsing off  the CHG Soap.                9.  Pat yourself dry with a clean towel.            10.  Wear clean pajamas.            11.  Place clean sheets on your bed the night of your first shower and do not  sleep with pets. Day of Surgery : Do not apply any lotions/deodorants the morning of surgery.  Please wear clean clothes to the hospital/surgery center.  FAILURE TO FOLLOW THESE INSTRUCTIONS MAY RESULT IN THE CANCELLATION OF YOUR SURGERY PATIENT SIGNATURE_________________________________  NURSE SIGNATURE__________________________________  ________________________________________________________________________    CLEAR LIQUID DIET   Foods Allowed                                                                     Foods Excluded  Coffee and tea, regular and  decaf                             liquids that you cannot  Plain Jell-O in any flavor                                             see through such as: Fruit ices (not with fruit pulp)                                     milk, soups, orange juice  Iced Popsicles                                    All solid food Carbonated beverages, regular and diet                                    Cranberry, grape and apple juices Sports drinks like Gatorade Lightly seasoned clear broth or consume(fat free) Sugar, honey syrup  Sample Menu Breakfast                                Lunch                                     Supper Cranberry juice                    Beef broth                            Chicken broth Jell-O                                     Grape juice                           Apple juice Coffee or tea                        Jell-O                                      Popsicle                                                Coffee or tea                        Coffee or tea  _____________________________________________________________________

## 2015-02-04 ENCOUNTER — Encounter (HOSPITAL_COMMUNITY)
Admission: RE | Admit: 2015-02-04 | Discharge: 2015-02-04 | Disposition: A | Discharge status: Home / Self Care | Payer: PPO | Source: Ambulatory Visit | Attending: Orthopedic Surgery | Admitting: Orthopedic Surgery

## 2015-02-04 ENCOUNTER — Encounter (HOSPITAL_COMMUNITY): Payer: Self-pay

## 2015-02-04 DIAGNOSIS — N400 Enlarged prostate without lower urinary tract symptoms: Secondary | ICD-10-CM | POA: Diagnosis not present

## 2015-02-04 DIAGNOSIS — X58XXXA Exposure to other specified factors, initial encounter: Secondary | ICD-10-CM | POA: Diagnosis not present

## 2015-02-04 DIAGNOSIS — Z87891 Personal history of nicotine dependence: Secondary | ICD-10-CM | POA: Diagnosis not present

## 2015-02-04 DIAGNOSIS — I10 Essential (primary) hypertension: Secondary | ICD-10-CM | POA: Diagnosis not present

## 2015-02-04 DIAGNOSIS — Z96611 Presence of right artificial shoulder joint: Secondary | ICD-10-CM | POA: Diagnosis not present

## 2015-02-04 DIAGNOSIS — M2241 Chondromalacia patellae, right knee: Secondary | ICD-10-CM | POA: Diagnosis not present

## 2015-02-04 DIAGNOSIS — S83281A Other tear of lateral meniscus, current injury, right knee, initial encounter: Secondary | ICD-10-CM | POA: Diagnosis not present

## 2015-02-04 DIAGNOSIS — Z96652 Presence of left artificial knee joint: Secondary | ICD-10-CM | POA: Diagnosis not present

## 2015-02-04 DIAGNOSIS — Y999 Unspecified external cause status: Secondary | ICD-10-CM | POA: Diagnosis not present

## 2015-02-04 DIAGNOSIS — Y929 Unspecified place or not applicable: Secondary | ICD-10-CM | POA: Diagnosis not present

## 2015-02-04 DIAGNOSIS — Z79899 Other long term (current) drug therapy: Secondary | ICD-10-CM | POA: Diagnosis not present

## 2015-02-04 DIAGNOSIS — E559 Vitamin D deficiency, unspecified: Secondary | ICD-10-CM | POA: Diagnosis not present

## 2015-02-04 DIAGNOSIS — D519 Vitamin B12 deficiency anemia, unspecified: Secondary | ICD-10-CM | POA: Diagnosis not present

## 2015-02-04 DIAGNOSIS — Z79890 Hormone replacement therapy (postmenopausal): Secondary | ICD-10-CM | POA: Diagnosis not present

## 2015-02-04 DIAGNOSIS — E785 Hyperlipidemia, unspecified: Secondary | ICD-10-CM | POA: Diagnosis not present

## 2015-02-04 DIAGNOSIS — E039 Hypothyroidism, unspecified: Secondary | ICD-10-CM | POA: Diagnosis not present

## 2015-02-04 DIAGNOSIS — Y939 Activity, unspecified: Secondary | ICD-10-CM | POA: Diagnosis not present

## 2015-02-04 DIAGNOSIS — Z85828 Personal history of other malignant neoplasm of skin: Secondary | ICD-10-CM | POA: Diagnosis not present

## 2015-02-04 DIAGNOSIS — Z85528 Personal history of other malignant neoplasm of kidney: Secondary | ICD-10-CM | POA: Diagnosis not present

## 2015-02-04 DIAGNOSIS — Z7952 Long term (current) use of systemic steroids: Secondary | ICD-10-CM | POA: Diagnosis not present

## 2015-02-04 HISTORY — DX: Malignant (primary) neoplasm, unspecified: C80.1

## 2015-02-04 LAB — CBC
HEMATOCRIT: 41.1 % (ref 39.0–52.0)
Hemoglobin: 13.3 g/dL (ref 13.0–17.0)
MCH: 33.5 pg (ref 26.0–34.0)
MCHC: 32.4 g/dL (ref 30.0–36.0)
MCV: 103.5 fL — ABNORMAL HIGH (ref 78.0–100.0)
Platelets: 245 10*3/uL (ref 150–400)
RBC: 3.97 MIL/uL — ABNORMAL LOW (ref 4.22–5.81)
RDW: 14.6 % (ref 11.5–15.5)
WBC: 10.7 10*3/uL — AB (ref 4.0–10.5)

## 2015-02-04 LAB — BASIC METABOLIC PANEL
Anion gap: 8 (ref 5–15)
BUN: 26 mg/dL — ABNORMAL HIGH (ref 6–20)
CO2: 30 mmol/L (ref 22–32)
CREATININE: 1.43 mg/dL — AB (ref 0.61–1.24)
Calcium: 9.4 mg/dL (ref 8.9–10.3)
Chloride: 103 mmol/L (ref 101–111)
GFR calc Af Amer: 48 mL/min — ABNORMAL LOW (ref >60)
GFR calc non Af Amer: 42 mL/min — ABNORMAL LOW (ref >60)
GLUCOSE: 150 mg/dL — AB (ref 65–99)
Potassium: 4.4 mmol/L (ref 3.5–5.1)
Sodium: 141 mmol/L (ref 135–145)

## 2015-02-04 NOTE — Progress Notes (Signed)
Preop EKG shown to Dr Landry Dyke at time of preop appt and anesthesia consult.  No new orders given.

## 2015-02-04 NOTE — Anesthesia Preprocedure Evaluation (Addendum)
Anesthesia Evaluation  Patient identified by MRN, date of birth, ID band Patient awake    Reviewed: Allergy & Precautions, H&P , NPO status , Patient's Chart, lab work & pertinent test results  History of Anesthesia Complications (+) DIFFICULT AIRWAY  Airway Mallampati: III  TM Distance: <3 FB Neck ROM: full    Dental  (+) Dental Advisory Given, Caps Bridges both sides upper:   Pulmonary neg pulmonary ROS, former smoker,  breath sounds clear to auscultation  Pulmonary exam normal       Cardiovascular hypertension, On Medications Normal cardiovascular examRhythm:regular Rate:Normal  ECG - bifascicular block new since 2013 ( the LAFB is new - just RBBB before )   Neuro/Psych glaucoma negative neurological ROS  negative psych ROS   GI/Hepatic negative GI ROS, Neg liver ROS,   Endo/Other  Hypothyroidism 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  Renal/GU Renal cell ca  negative genitourinary   Musculoskeletal   Abdominal   Peds  Hematology negative hematology ROS (+)   Anesthesia Other Findings   Reproductive/Obstetrics negative OB ROS                           Anesthesia Physical Anesthesia Plan  ASA: III  Anesthesia Plan: Spinal   Post-op Pain Management:    Induction:   Airway Management Planned: Simple Face Mask  Additional Equipment:   Intra-op Plan:   Post-operative Plan:   Informed Consent: I have reviewed the patients History and Physical, chart, labs and discussed the procedure including the risks, benefits and alternatives for the proposed anesthesia with the patient or authorized representative who has indicated his/her understanding and acceptance.   Dental Advisory Given  Plan Discussed with: CRNA and Surgeon  Anesthesia Plan Comments: (Patient requests a spinal)      Anesthesia Quick Evaluation

## 2015-02-04 NOTE — Progress Notes (Signed)
BMp results done 02/04/2015 faxed via EPIC to Dr Wynelle Link.

## 2015-02-04 NOTE — Progress Notes (Signed)
Wife lives in [place] at [place] with Parkinson's.

## 2015-02-05 DIAGNOSIS — S83289A Other tear of lateral meniscus, current injury, unspecified knee, initial encounter: Secondary | ICD-10-CM | POA: Diagnosis present

## 2015-02-05 NOTE — H&P (Signed)
CC- [name] [name]. is a 81 y.o. male who presents with right knee pain.  HPI- . Knee Pain: Patient presents with knee pain involving the  right knee. Onset of the symptoms was several months ago. Inciting event: none known. Current symptoms include giving out, pain located laterally and swelling. Pain is aggravated by lateral movements, pivoting, rising after sitting, standing and walking.  Patient has had no prior knee problems. Evaluation to date: MRI: abnormal lateral meniscal tear. Treatment to date: corticosteroid injection which was not very effective.  Past Medical History  Diagnosis Date  . Hypertension   . Arthritis     Status post left total replacement 8 2011  . Pituitary mass 2000    S/p transphenoidal excision 05/1999 ([facility])  . 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 Hypothyroidism (acquired) 2000  . Anemia, B12 deficiency 2000  . Hyperlipidemia 2003  . Vitamin D deficiency 2009  . History of renal cell carcinoma 1994    Status post right nephrectomy  . BPH (benign prostatic hyperplasia) 2013  . Injury of right rotator cuff 2013    Status post right shoulder arthroplasty April 2013  . Glaucoma 2007    Status post left trabeculectomy 2007  . Blepharitis of both eyes 2006    Status post ectropion surgery bilateral  . History of acute pancreatitis 1984  . History of colon polyps     Colonoscopy 2001  . Allergic rhinitis     Prior allergy shots 20 years  . Colitis 2014  . Anemia   . Difficult intubation 1994    surgery had to be  stopped due to injury to "throat"  . Difficult intubation 1994    no trouble since.  Required nasotracheal intubation  '02 [facility]   . Cancer     renal cell ca and skin cancer     Past Surgical History  Procedure Laterality Date  . Tonsillectomy    . Eye surgery  over last 6 yrs.      trabeculectomy...   . Eye surgery   cat ext ou  . Brain surgery  2000    pituatary gland removed .   Marland Kitchen Joint replacement  2011    knee left  . Cholecystectomy  1984  . Reverse shoulder arthroplasty  12/15/2011    Procedure: REVERSE SHOULDER ARTHROPLASTY;  Surgeon: Marin Shutter, MD;  Location: [place];  Service: Orthopedics;  Laterality: Right;  right total reverse shoulder  . Nephrectomy Right 1994    Renal cell  . Transsphenoidal excision pituitary tumor  05/1999    A.Tommi Rumps, M.D.([facility])  . Ectropion surgery Bilateral 2006  . Mohs procedure       for skin cancer on nose     Prior to Admission medications   Medication Sig Start Date End Date Taking? Authorizing Provider  COD LIVER OIL PO Take 2 tablets by mouth daily.   Yes Historical Provider, MD  Coenzyme Q10 300 MG CAPS Take 1 capsule by mouth daily.   Yes Historical Provider, MD  Cyanocobalamin (B-12) 2500 MCG TABS Take 1 tablet by mouth daily.   Yes Historical Provider, MD  finasteride (PROSCAR) 5 MG tablet Take 5 mg by mouth daily.   Yes Historical Provider, MD  Flaxseed MISC 15 mLs by Does not apply route daily.   Yes Historical Provider, MD  levothyroxine (SYNTHROID, LEVOTHROID) 88 MCG tablet Take 88 mcg by mouth daily.   Yes Historical Provider, MD  Multiple Vitamins-Minerals (MULTIVITAMINS THER. W/MINERALS) TABS tablet Take 1 tablet by mouth daily.   Yes Historical Provider, MD  olmesartan (BENICAR) 20 MG tablet Take 20 mg by mouth daily.   Yes Historical Provider, MD  Polyethyl Glycol-Propyl Glycol (SYSTANE OP) Apply 2 drops to eye at bedtime. As needed   Yes Historical Provider, MD  pravastatin (PRAVACHOL) 80 MG tablet Take 80 mg by mouth daily.   Yes Historical Provider, MD  predniSONE (DELTASONE) 5 MG tablet Take 5 mg in the morning and 2.5 in the evening   Yes Historical Provider, MD  [place] 1 drop into both eyes 4 (four) times daily. Serum eye drops mixed with artifical tears. Pt gets mixed at [facility]. Dr. Edilia Bo   Yes Historical Provider, MD  TESTOSTERONE IM Inject 0.15 mLs into the muscle  every 7 (seven) days. On Fridays   Yes Historical Provider, MD  docusate sodium (COLACE) 100 MG capsule Take 100 mg by mouth daily. Patient takes in the pm    Historical Provider, MD  OVER THE COUNTER MEDICATION Vitamin D3 5000 IU daily    Historical Provider, MD   KNEE EXAM antalgic gait, soft tissue tenderness over lateral joint line, effusion, negative drawer sign, collateral ligaments intact  Physical Examination: General appearance - alert, well appearing, and in no distress Mental status - alert, oriented to person, place, and time Chest - clear to auscultation, no wheezes, rales or rhonchi, symmetric air entry Heart - normal rate, regular rhythm, normal S1, S2, no murmurs, rubs, clicks or gallops Abdomen - soft, nontender, nondistended, no masses or organomegaly Neurological - alert, oriented, normal speech, no focal findings or movement disorder noted   Asessment/Plan--- Right knee lateral meniscal tear- - Plan right knee arthroscopy with meniscal debridement. Procedure risks and potential comps discussed with patient who elects to proceed. Goals are decreased pain and increased function with a high likelihood of achieving both

## 2015-02-06 ENCOUNTER — Ambulatory Visit (HOSPITAL_COMMUNITY): Payer: PPO | Admitting: Anesthesiology

## 2015-02-06 ENCOUNTER — Encounter (HOSPITAL_COMMUNITY): Payer: Self-pay

## 2015-02-06 ENCOUNTER — Encounter (HOSPITAL_COMMUNITY)
Admission: RE | Disposition: A | Discharge status: Home / Self Care | Payer: Self-pay | Source: Ambulatory Visit | Attending: Orthopedic Surgery

## 2015-02-06 ENCOUNTER — Ambulatory Visit (HOSPITAL_COMMUNITY)
Admission: RE | Admit: 2015-02-06 | Discharge: 2015-02-06 | Disposition: A | Discharge status: Home / Self Care | Payer: PPO | Source: Ambulatory Visit | Attending: Orthopedic Surgery | Admitting: Orthopedic Surgery

## 2015-02-06 DIAGNOSIS — Z96652 Presence of left artificial knee joint: Secondary | ICD-10-CM | POA: Insufficient documentation

## 2015-02-06 DIAGNOSIS — E039 Hypothyroidism, unspecified: Secondary | ICD-10-CM | POA: Diagnosis not present

## 2015-02-06 DIAGNOSIS — Z79899 Other long term (current) drug therapy: Secondary | ICD-10-CM | POA: Insufficient documentation

## 2015-02-06 DIAGNOSIS — D519 Vitamin B12 deficiency anemia, unspecified: Secondary | ICD-10-CM | POA: Insufficient documentation

## 2015-02-06 DIAGNOSIS — E559 Vitamin D deficiency, unspecified: Secondary | ICD-10-CM | POA: Insufficient documentation

## 2015-02-06 DIAGNOSIS — E785 Hyperlipidemia, unspecified: Secondary | ICD-10-CM | POA: Insufficient documentation

## 2015-02-06 DIAGNOSIS — I10 Essential (primary) hypertension: Secondary | ICD-10-CM | POA: Diagnosis not present

## 2015-02-06 DIAGNOSIS — S83281A Other tear of lateral meniscus, current injury, right knee, initial encounter: Secondary | ICD-10-CM | POA: Insufficient documentation

## 2015-02-06 DIAGNOSIS — Z85828 Personal history of other malignant neoplasm of skin: Secondary | ICD-10-CM | POA: Insufficient documentation

## 2015-02-06 DIAGNOSIS — Y939 Activity, unspecified: Secondary | ICD-10-CM | POA: Insufficient documentation

## 2015-02-06 DIAGNOSIS — Y999 Unspecified external cause status: Secondary | ICD-10-CM | POA: Insufficient documentation

## 2015-02-06 DIAGNOSIS — Z96611 Presence of right artificial shoulder joint: Secondary | ICD-10-CM | POA: Insufficient documentation

## 2015-02-06 DIAGNOSIS — Z7952 Long term (current) use of systemic steroids: Secondary | ICD-10-CM | POA: Insufficient documentation

## 2015-02-06 DIAGNOSIS — Z79890 Hormone replacement therapy (postmenopausal): Secondary | ICD-10-CM | POA: Insufficient documentation

## 2015-02-06 DIAGNOSIS — M2241 Chondromalacia patellae, right knee: Secondary | ICD-10-CM | POA: Insufficient documentation

## 2015-02-06 DIAGNOSIS — Z85528 Personal history of other malignant neoplasm of kidney: Secondary | ICD-10-CM | POA: Insufficient documentation

## 2015-02-06 DIAGNOSIS — S83289A Other tear of lateral meniscus, current injury, unspecified knee, initial encounter: Secondary | ICD-10-CM | POA: Diagnosis present

## 2015-02-06 DIAGNOSIS — Y929 Unspecified place or not applicable: Secondary | ICD-10-CM | POA: Insufficient documentation

## 2015-02-06 DIAGNOSIS — X58XXXA Exposure to other specified factors, initial encounter: Secondary | ICD-10-CM | POA: Insufficient documentation

## 2015-02-06 DIAGNOSIS — N400 Enlarged prostate without lower urinary tract symptoms: Secondary | ICD-10-CM | POA: Insufficient documentation

## 2015-02-06 DIAGNOSIS — Z87891 Personal history of nicotine dependence: Secondary | ICD-10-CM | POA: Insufficient documentation

## 2015-02-06 HISTORY — PX: KNEE ARTHROSCOPY: SHX127

## 2015-02-06 SURGERY — ARTHROSCOPY, KNEE
Anesthesia: Spinal | Site: Knee | Laterality: Right

## 2015-02-06 MED ORDER — ACETAMINOPHEN 10 MG/ML IV SOLN
1000.0000 mg | Freq: Once | INTRAVENOUS | Status: AC
  Administered 2015-02-06: 1000 mg via INTRAVENOUS
  Filled 2015-02-06: qty 100

## 2015-02-06 MED ORDER — ACETAMINOPHEN 10 MG/ML IV SOLN
INTRAVENOUS | Status: AC
  Filled 2015-02-06: qty 100

## 2015-02-06 MED ORDER — MEPERIDINE HCL 50 MG/ML IJ SOLN
INTRAMUSCULAR | Status: AC
  Filled 2015-02-06: qty 1

## 2015-02-06 MED ORDER — PROPOFOL 10 MG/ML IV BOLUS
INTRAVENOUS | Status: AC
  Filled 2015-02-06: qty 20

## 2015-02-06 MED ORDER — PROMETHAZINE HCL 25 MG/ML IJ SOLN: 6.2500 mg | INTRAMUSCULAR | Status: DC | PRN

## 2015-02-06 MED ORDER — BUPIVACAINE IN DEXTROSE 0.75-8.25 % IT SOLN
INTRATHECAL | Status: DC | PRN
  Administered 2015-02-06: 1.2 mL via INTRATHECAL

## 2015-02-06 MED ORDER — ONDANSETRON HCL 4 MG/2ML IJ SOLN
INTRAMUSCULAR | Status: AC
  Filled 2015-02-06: qty 2

## 2015-02-06 MED ORDER — CEFAZOLIN SODIUM-DEXTROSE 2-3 GM-% IV SOLR
2.0000 g | INTRAVENOUS | Status: AC
  Administered 2015-02-06: 2 g via INTRAVENOUS

## 2015-02-06 MED ORDER — SODIUM CHLORIDE 0.9 % IV SOLN: INTRAVENOUS | Status: DC

## 2015-02-06 MED ORDER — CEFAZOLIN SODIUM-DEXTROSE 2-3 GM-% IV SOLR
INTRAVENOUS | Status: AC
  Filled 2015-02-06: qty 50

## 2015-02-06 MED ORDER — DEXAMETHASONE SODIUM PHOSPHATE 4 MG/ML IJ SOLN
INTRAMUSCULAR | Status: DC | PRN
  Administered 2015-02-06: 10 mg via INTRAVENOUS

## 2015-02-06 MED ORDER — BUPIVACAINE-EPINEPHRINE 0.25% -1:200000 IJ SOLN
INTRAMUSCULAR | Status: DC | PRN
  Administered 2015-02-06: 20 mL

## 2015-02-06 MED ORDER — PHENYLEPHRINE 40 MCG/ML (10ML) SYRINGE FOR IV PUSH (FOR BLOOD PRESSURE SUPPORT)
PREFILLED_SYRINGE | INTRAVENOUS | Status: AC
  Filled 2015-02-06: qty 10

## 2015-02-06 MED ORDER — ONDANSETRON HCL 4 MG/2ML IJ SOLN
INTRAMUSCULAR | Status: DC | PRN
  Administered 2015-02-06: 4 mg via INTRAVENOUS

## 2015-02-06 MED ORDER — FENTANYL CITRATE (PF) 100 MCG/2ML IJ SOLN: 25.0000 ug | INTRAMUSCULAR | Status: DC | PRN

## 2015-02-06 MED ORDER — BUPIVACAINE-EPINEPHRINE (PF) 0.25% -1:200000 IJ SOLN
INTRAMUSCULAR | Status: AC
  Filled 2015-02-06: qty 30

## 2015-02-06 MED ORDER — DEXAMETHASONE SODIUM PHOSPHATE 10 MG/ML IJ SOLN
INTRAMUSCULAR | Status: AC
  Filled 2015-02-06: qty 1

## 2015-02-06 MED ORDER — PHENYLEPHRINE HCL 10 MG/ML IJ SOLN
INTRAMUSCULAR | Status: AC
  Filled 2015-02-06: qty 1

## 2015-02-06 MED ORDER — MEPERIDINE HCL 50 MG/ML IJ SOLN
6.2500 mg | INTRAMUSCULAR | Status: DC | PRN
  Administered 2015-02-06: 6.25 mg via INTRAVENOUS

## 2015-02-06 MED ORDER — LIDOCAINE HCL (CARDIAC) 20 MG/ML IV SOLN
INTRAVENOUS | Status: AC
  Filled 2015-02-06: qty 5

## 2015-02-06 MED ORDER — LACTATED RINGERS IV SOLN
INTRAVENOUS | Status: DC
  Administered 2015-02-06: 1000 mL via INTRAVENOUS

## 2015-02-06 MED ORDER — METHOCARBAMOL 500 MG PO TABS
500.0000 mg | ORAL_TABLET | Freq: Four times a day (QID) | ORAL | Status: DC
Start: 2015-02-06 — End: 2015-07-01

## 2015-02-06 MED ORDER — DEXAMETHASONE SODIUM PHOSPHATE 10 MG/ML IJ SOLN: 10.0000 mg | Freq: Once | INTRAMUSCULAR | Status: DC

## 2015-02-06 MED ORDER — PROPOFOL INFUSION 10 MG/ML OPTIME
INTRAVENOUS | Status: DC | PRN
  Administered 2015-02-06: 75 ug/kg/min via INTRAVENOUS

## 2015-02-06 MED ORDER — HYDROCODONE-ACETAMINOPHEN 5-325 MG PO TABS: 1.0000 | ORAL_TABLET | Freq: Four times a day (QID) | ORAL | Status: DC | PRN

## 2015-02-06 MED ORDER — CHLORHEXIDINE GLUCONATE 4 % EX LIQD: 60.0000 mL | Freq: Once | CUTANEOUS | Status: DC

## 2015-02-06 SURGICAL SUPPLY — 27 items
BANDAGE ELASTIC 6 VELCRO ST LF (GAUZE/BANDAGES/DRESSINGS) ×3 IMPLANT
BLADE 4.2CUDA (BLADE) ×3 IMPLANT
COVER SURGICAL LIGHT HANDLE (MISCELLANEOUS) ×3 IMPLANT
CUFF TOURN SGL QUICK 34 (TOURNIQUET CUFF) ×3
CUFF TRNQT CYL 34X4X40X1 (TOURNIQUET CUFF) ×1 IMPLANT
DRAPE U-SHAPE 47X51 STRL (DRAPES) ×3 IMPLANT
DRSG EMULSION OIL 3X3 NADH (GAUZE/BANDAGES/DRESSINGS) ×3 IMPLANT
DRSG PAD ABDOMINAL 8X10 ST (GAUZE/BANDAGES/DRESSINGS) ×3 IMPLANT
DURAPREP 26ML APPLICATOR (WOUND CARE) ×3 IMPLANT
GAUZE SPONGE 4X4 12PLY STRL (GAUZE/BANDAGES/DRESSINGS) ×3 IMPLANT
GLOVE BIO SURGEON STRL SZ8 (GLOVE) ×3 IMPLANT
GLOVE BIOGEL PI IND STRL 8 (GLOVE) ×1 IMPLANT
GLOVE BIOGEL PI INDICATOR 8 (GLOVE) ×2
GOWN STRL REUS W/TWL LRG LVL3 (GOWN DISPOSABLE) ×3 IMPLANT
KIT BASIN OR (CUSTOM PROCEDURE TRAY) ×3 IMPLANT
MANIFOLD NEPTUNE II (INSTRUMENTS) ×3 IMPLANT
PACK ARTHROSCOPY WL (CUSTOM PROCEDURE TRAY) ×3 IMPLANT
PACK ICE MAXI GEL EZY WRAP (MISCELLANEOUS) ×9 IMPLANT
PADDING CAST COTTON 6X4 STRL (CAST SUPPLIES) ×4 IMPLANT
PEN SKIN MARKING BROAD (MISCELLANEOUS) ×3 IMPLANT
POSITIONER SURGICAL ARM (MISCELLANEOUS) ×3 IMPLANT
SET ARTHROSCOPY TUBING (MISCELLANEOUS) ×3
SET ARTHROSCOPY TUBING LN (MISCELLANEOUS) ×1 IMPLANT
SUT ETHILON 4 0 PS 2 18 (SUTURE) ×3 IMPLANT
TOWEL OR 17X26 10 PK STRL BLUE (TOWEL DISPOSABLE) ×3 IMPLANT
WAND 90 DEG TURBOVAC W/CORD (SURGICAL WAND) ×2 IMPLANT
WRAP KNEE MAXI GEL POST OP (GAUZE/BANDAGES/DRESSINGS) ×3 IMPLANT

## 2015-02-06 NOTE — Progress Notes (Addendum)
Patient up to bathroom with 2 person assist, walker and gait belt. Attempt to void unsuccessful.   Up to bathroom  At 1813 with walker and gait belt. Attempted to void unsuccessful.   Paged Dr. Wynelle Link at [phone]. Return call at [place]. Received order for in and out cath and may discharge after.

## 2015-02-06 NOTE — Transfer of Care (Signed)
Immediate Anesthesia Transfer of Care Note  Patient: [name] [name].  Procedure(s) Performed: Procedure(s): RIGHT ARTHROSCOPY KNEE WITH LATERAL MENSICAL  DEBRIDEMENT (Right)  Patient Location: PACU  Anesthesia Type:MAC and Spinal  Level of Consciousness:  sedated, patient cooperative and responds to stimulation  Airway & Oxygen Therapy:Patient Spontanous Breathing and Patient connected to face mask oxgen  Post-op Assessment:  Report given to PACU RN and Post -op Vital signs reviewed and stable  Post vital signs:  Reviewed and stable  Last Vitals:  Filed Vitals:   02/06/15 1254  BP: 156/63  Pulse: 63  Temp: 36.7 C  Resp: 18    Complications: No apparent anesthesia complications

## 2015-02-06 NOTE — Op Note (Signed)
Preoperative diagnosis-  Right knee lateral meniscal tear  Postoperative diagnosis Right- knee lateral meniscal tear   Procedure- Right knee arthroscopy with lateral  meniscal debridement    Surgeon- Dione Plover. Hazeline Charnley, MD  Anesthesia-General  EBL-  Minimal  Complications- None  Condition- PACU - hemodynamically stable.  Brief clinical note- -[name] [name]. is a 81 y.o.  male with a several month history of right knee pain and mechanical symptoms. Exam and history suggested lateral meniscal tear confirmed by MRI. The patient presents now for arthroscopy and debridement   Procedure in detail -       After successful administration of General anesthetic, a tourmiquet is placed high on the Right  thigh and the Right lower extremity is prepped and draped in the usual sterile fashion. Time out is performed by the surgical team. Standard superomedial and inferolateral portal sites are marked and incisions made with an 11 blade. The inflow cannula is passed through the superomedial portal and camera through the inferolateral portal and inflow is initiated. Arthroscopic visualization proceeds.      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 The medial and lateral gutters are visualized and there are  no loose bodies. Flexion and valgus force is applied to the knee and the medial compartment is entered. A spinal needle is passed into the joint through the site marked for the inferomedial portal. A small incision is made and the dilator passed into the joint. The findings for the medial compartment are normal .     The intercondylar notch is visualized and the ACL appears normal. The lateral compartment is entered and the findings are unstable tear body and anterior horn lateral meniscus with focal 1 x 1 cm area exposed bone lateral tibial plateau and lateral femoral condyle.The tear is debrided to a stable  base with baskets and a shaver and sealed off with the Arthrocare. It is probed and found to be stable.     The joint is again inspected and there are no other tears, defects or loose bodies identified. The arthroscopic equipment is then removed from the inferior portals which are closed with interrupted 4-0 nylon. 20 ml of .25% Marcaine with epinephrine are injected through the inflow cannula and the cannula is then removed and the portal closed with nylon. The incisions are cleaned and dried and a bulky sterile dressing is applied. The patient is then awakened and transported to recovery in stable condition.   02/06/2015, 3:33 PM

## 2015-02-06 NOTE — Anesthesia Procedure Notes (Signed)
Spinal Patient location during procedure: OR Staffing Anesthesiologist: Suzette Battiest Performed by: anesthesiologist  Preanesthetic Checklist Completed: patient identified, site marked, surgical consent, pre-op evaluation, timeout performed, IV checked, risks and benefits discussed and monitors and equipment checked Spinal Block Patient position: sitting Prep: Betadine Patient monitoring: heart rate, continuous pulse ox and blood pressure Approach: midline Location: L4-5 Injection technique: single-shot Needle Needle type: Quincke  Needle gauge: 22 G Needle length: 9 cm Additional Notes Expiration date of kit checked and confirmed. Patient tolerated procedure well, without complications.

## 2015-02-06 NOTE — Interval H&P Note (Signed)
History and Physical Interval Note:  02/06/2015 2:21 PM  [name] [name].  has presented today for surgery, with the diagnosis of RIGHT KNEE LATERAL MENISCUS TEAR  The various methods of treatment have been discussed with the patient and family. After consideration of risks, benefits and other options for treatment, the patient has consented to  Procedure(s): RIGHT ARTHROSCOPY KNEE WITH DEBRIDEMENT (Right) as a surgical intervention .  The patient's history has been reviewed, patient examined, no change in status, stable for surgery.  I have reviewed the patient's chart and labs.  Questions were answered to the patient's satisfaction.     [name]

## 2015-02-06 NOTE — Discharge Instructions (Signed)
Dr. Gaynelle Arabian Total Joint Specialist [facility] [address], [place], [place] [address] [phone]   Arthroscopic Procedure, Knee An arthroscopic procedure can find what is wrong with your knee. PROCEDURE Arthroscopy is a surgical technique that allows your orthopedic surgeon to diagnose and treat your knee injury with accuracy. They will look into your knee through a small instrument. This is almost like a small (pencil sized) telescope. Because arthroscopy affects your knee less than open knee surgery, you can anticipate a more rapid recovery. Taking an active role by following your caregiver's instructions will help with rapid and complete recovery. Use crutches, rest, elevation, ice, and knee exercises as instructed. The length of recovery depends on various factors including type of injury, age, physical condition, medical conditions, and your rehabilitation. Your knee is the joint between the large bones (femur and tibia) in your leg. Cartilage covers these bone ends which are smooth and slippery and allow your knee to bend and move smoothly. Two menisci, thick, semi-lunar shaped pads of cartilage which form a rim inside the joint, help absorb shock and stabilize your knee. Ligaments bind the bones together and support your knee joint. Muscles move the joint, help support your knee, and take stress off the joint itself. Because of this all programs and physical therapy to rehabilitate an injured or repaired knee require rebuilding and strengthening your muscles. AFTER THE PROCEDURE  After the procedure, you will be moved to a recovery area until most of the effects of the medication have worn off. Your caregiver will discuss the test results with you.   Only take over-the-counter or prescription medicines for pain, discomfort, or fever as directed by your caregiver.  SEEK MEDICAL CARE IF:   You have increased bleeding from your wounds.   You see  redness, swelling, or have increasing pain in your wounds.   You have pus coming from your wound.   You have an oral temperature above 102 F (38.9 C).   You notice a bad smell coming from the wound or dressing.   You have severe pain with any motion of your knee.  SEEK IMMEDIATE MEDICAL CARE IF:   You develop a rash.   You have difficulty breathing.   You have any allergic problems.  FURTHER INSTRUCTIONS:   ICE to the affected knee every three hours for 30 minutes at a time and then as needed for pain and swelling.  Continue to use ice on the knee for pain and swelling from surgery. You may notice swelling that will progress down to the foot and ankle.  This is normal after surgery.  Elevate the leg when you are not up walking on it.    DIET You may resume your previous home diet once your are discharged from the hospital.  DRESSING / WOUND CARE / SHOWERING  You may start showering two days after being discharged home but do not submerge the incisions under water.  Change dressing 48 hours after the procedure and then cover the small incisions with band aids until your follow up visit. Change the surgical dressings daily and reapply a dry dressing each time.   ACTIVITY Walk with your walker as instructed. Use walker as long as suggested by your caregivers. Avoid periods of inactivity such as sitting longer than an hour when not asleep. This helps prevent blood clots.  You may resume a sexual relationship in one month or when given the OK by your doctor.  You may return  to work once you are cleared by your doctor.  Do not drive a car for 6 weeks or until released by you surgeon.  Do not drive while taking narcotics.  WEIGHT BEARING Weight bearing as tolerated on your right leg. Use the walker for comfort until you feel comfortable and safe without it  POSTOPERATIVE CONSTIPATION PROTOCOL Constipation - defined medically as fewer than three stools per week and severe  constipation as less than one stool per week.  One of the most common issues patients have following surgery is constipation.  Even if you have a regular bowel pattern at home, your normal regimen is likely to be disrupted due to multiple reasons following surgery.  Combination of anesthesia, postoperative narcotics, change in appetite and fluid intake all can affect your bowels.  In order to avoid complications following surgery, here are some recommendations in order to help you during your recovery period.  Colace (docusate) - Pick up an over-the-counter form of Colace or another stool softener and take twice a day as long as you are requiring postoperative pain medications.  Take with a full glass of water daily.  If you experience loose stools or diarrhea, hold the colace until you stool forms back up.  If your symptoms do not get better within 1 week or if they get worse, check with your doctor.  Dulcolax (bisacodyl) - Pick up over-the-counter and take as directed by the product packaging as needed to assist with the movement of your bowels.  Take with a full glass of water.  Use this product as needed if not relieved by Colace only.   MiraLax (polyethylene glycol) - Pick up over-the-counter to have on hand.  MiraLax is a solution that will increase the amount of water in your bowels to assist with bowel movements.  Take as directed and can mix with a glass of water, juice, soda, coffee, or tea.  Take if you go more than two days without a movement. Do not use MiraLax more than once per day. Call your doctor if you are still constipated or irregular after using this medication for 7 days in a row.  If you continue to have problems with postoperative constipation, please contact the office for further assistance and recommendations.  If you experience "the worst abdominal pain ever" or develop nausea or vomiting, please contact the office immediatly for further recommendations for  treatment.  ITCHING  If you experience itching with your medications, try taking only a single pain pill, or even half a pain pill at a time.  You can also use Benadryl over the counter for itching or also to help with sleep.   TED HOSE STOCKINGS Wear the elastic stockings on both legs for three weeks following surgery during the day but you may remove then at night for sleeping.  MEDICATIONS See your medication summary on the After Visit Summary that the nursing staff will review with you prior to discharge.  You may have some home medications which will be placed on hold until you complete the course of blood thinner medication.  It is important for you to complete the blood thinner medication as prescribed by your surgeon.  Continue your approved medications as instructed at time of discharge. Do not drive while taking narcotics.   PRECAUTIONS If you experience chest pain or shortness of breath - call 911 immediately for transfer to the hospital emergency department.  If you develop a fever greater that 101 F, purulent drainage from wound, increased  redness or drainage from wound, foul odor from the wound/dressing, or calf pain - CONTACT YOUR SURGEON.                                                   FOLLOW-UP APPOINTMENTS Make sure you keep all of your appointments after your operation with your surgeon and caregivers. You should call the office at [phone]  and make an appointment for approximately one week after the date of your surgery or on the date instructed by your surgeon outlined in the "After Visit Summary".  RANGE OF MOTION AND STRENGTHENING EXERCISES  Rehabilitation of the knee is important following a knee injury or an operation. After just a few days of immobilization, the muscles of the thigh which control the knee become weakened and shrink (atrophy). Knee exercises are designed to build up the tone and strength of the thigh muscles and to improve knee motion. Often  times heat used for twenty to thirty minutes before working out will loosen up your tissues and help with improving the range of motion but do not use heat for the first two weeks following surgery. These exercises can be done on a training (exercise) mat, on the floor, on a table or on a bed. Use what ever works the best and is most comfortable for you Knee exercises include:  QUAD STRENGTHENING EXERCISES Strengthening Quadriceps Sets  Tighten muscles on top of thigh by pushing knees down into floor or table. Hold for 20 seconds. Repeat 10 times. Do 2 sessions per day.     Strengthening Terminal Knee Extension  With knee bent over bolster, straighten knee by tightening muscle on top of thigh. Be sure to keep bottom of knee on bolster. Hold for 20 seconds. Repeat 10 times. Do 2 sessions per day.   Straight Leg with Bent Knee  Lie on back with opposite leg bent. Keep involved knee slightly bent at knee and raise leg 4-6". Hold for 10 seconds. Repeat 20 times per set. Do 2 sets per session. Do 2 sessions per day.

## 2015-02-06 NOTE — Progress Notes (Signed)
Patient returns from surgery with bloody dressing on left forearm. RN removes dressing to find skin tear under 2x2 dressing. Tegaderm applied. (1.5 cm x1.5 cm skin tear)

## 2015-02-09 ENCOUNTER — Encounter (HOSPITAL_COMMUNITY): Payer: Self-pay | Admitting: Orthopedic Surgery

## 2015-02-09 NOTE — Anesthesia Postprocedure Evaluation (Signed)
  Anesthesia Post-op Note  Patient: [name] [name].  Procedure(s) Performed: Procedure(s): RIGHT ARTHROSCOPY KNEE WITH LATERAL MENSICAL  DEBRIDEMENT (Right)  Patient Location: PACU  Anesthesia Type:Spinal  Level of Consciousness: awake and alert   Airway and Oxygen Therapy: Patient Spontanous Breathing  Post-op Pain: none  Post-op Assessment: Post-op Vital signs reviewed LLE Motor Response: Purposeful movement LLE Sensation: Full sensation RLE Motor Response: Purposeful movement RLE Sensation: Full sensation L Sensory Level: S5-Perianal area R Sensory Level: S5-Perianal area  Post-op Vital Signs: Reviewed  Last Vitals:  Filed Vitals:   02/06/15 1916  BP: 147/54  Pulse: 64  Temp: 36.7 C  Resp: 16    Complications: No apparent anesthesia complications

## 2015-05-06 DIAGNOSIS — H02109 Unspecified ectropion of unspecified eye, unspecified eyelid: Secondary | ICD-10-CM | POA: Insufficient documentation

## 2015-06-09 ENCOUNTER — Ambulatory Visit: Payer: Self-pay | Admitting: Orthopedic Surgery

## 2015-06-09 NOTE — Progress Notes (Signed)
Preoperative surgical orders have been place into the Epic hospital system for [employer]. on 06/09/2015, 12:39 PM  by Mickel Crow for surgery on 06-29-2015.  Preop Total Knee orders including Experal, IV Tylenol, and IV Decadron as long as there are no contraindications to the above medications. Arlee Muslim, PA-C

## 2015-06-23 NOTE — Patient Instructions (Addendum)
YOUR PROCEDURE IS SCHEDULED ON : 06/29/15  REPORT TO [facility] MAIN ENTRANCE FOLLOW SIGNS TO EAST ELEVATOR - GO TO 3rd FLOOR CHECK IN AT 3 EAST NURSES STATION (SHORT STAY) AT:  9:45 AM  CALL THIS NUMBER IF YOU HAVE PROBLEMS THE MORNING OF SURGERY [phone]  REMEMBER:ONLY 1 PER PERSON MAY GO TO SHORT STAY WITH YOU TO GET READY THE MORNING OF YOUR SURGERY  DO NOT EAT FOOD OR DRINK LIQUIDS AFTER MIDNIGHT  TAKE THESE MEDICINES THE MORNING OF SURGERY: PREDNISONE / SYNTHROID / PANTOPRAZOLE  YOU MAY NOT HAVE ANY METAL ON YOUR BODY INCLUDING HAIR PINS AND PIERCING'S. DO NOT WEAR JEWELRY, MAKEUP, LOTIONS, POWDERS OR PERFUMES. DO NOT WEAR NAIL POLISH. DO NOT SHAVE 48 HRS PRIOR TO SURGERY. MEN MAY SHAVE FACE AND NECK.  DO NOT [facility]. [facility] IS NOT RESPONSIBLE FOR VALUABLES.  CONTACTS, DENTURES OR PARTIALS MAY NOT BE WORN TO SURGERY. LEAVE SUITCASE IN CAR. CAN BE BROUGHT TO ROOM AFTER SURGERY.  PATIENTS DISCHARGED THE DAY OF SURGERY WILL NOT BE ALLOWED TO DRIVE HOME.  PLEASE READ OVER THE FOLLOWING INSTRUCTION SHEETS _________________________________________________________________________________                                          [place] - PREPARING FOR SURGERY  Before surgery, you can play an important role.  Because skin is not sterile, your skin needs to be as free of germs as possible.  You can reduce the number of germs on your skin by washing with CHG (chlorahexidine gluconate) soap before surgery.  CHG is an antiseptic cleaner which kills germs and bonds with the skin to continue killing germs even after washing. Please DO NOT use if you have an allergy to CHG or antibacterial soaps.  If your skin becomes reddened/irritated stop using the CHG and inform your nurse when you arrive at Short Stay. Do not shave (including legs and underarms) for at least 48 hours prior to the first CHG shower.  You may shave your face. Please  follow these instructions carefully:   1.  Shower with CHG Soap the night before surgery and the  morning of Surgery.   2.  If you choose to wash your hair, wash your hair first as usual with your  normal  Shampoo.   3.  After you shampoo, rinse your hair and body thoroughly to remove the  shampoo.                                         4.  Use CHG as you would any other liquid soap.  You can apply chg directly  to the skin and wash . Gently wash with scrungie or clean wascloth    5.  Apply the CHG Soap to your body ONLY FROM THE NECK DOWN.   Do not use on open                           Wound or open sores. Avoid contact with eyes, ears mouth and genitals (private parts).                        Genitals (private parts) with your normal  soap.              6.  Wash thoroughly, paying special attention to the area where your surgery  will be performed.   7.  Thoroughly rinse your body with warm water from the neck down.   8.  DO NOT shower/wash with your normal soap after using and rinsing off  the CHG Soap .                9.  Pat yourself dry with a clean towel.             10.  Wear clean night clothes to bed after shower             11.  Place clean sheets on your bed the night of your first shower and do not  sleep with pets.  Day of Surgery : Do not apply any lotions/deodorants the morning of surgery.  Please wear clean clothes to the hospital/surgery center.  FAILURE TO FOLLOW THESE INSTRUCTIONS MAY RESULT IN THE CANCELLATION OF YOUR SURGERY    PATIENT SIGNATURE_________________________________  ______________________________________________________________________     [name] [name]  An incentive spirometer is a tool that can help keep your lungs clear and active. This tool measures how well you are filling your lungs with each breath. Taking long deep breaths may help reverse or decrease the chance of developing breathing (pulmonary) problems (especially  infection) following:  A long period of time when you are unable to move or be active. BEFORE THE PROCEDURE   If the spirometer includes an indicator to show your best effort, your nurse or respiratory therapist will set it to a desired goal.  If possible, sit up straight or lean slightly forward. Try not to slouch.  Hold the incentive spirometer in an upright position. INSTRUCTIONS FOR USE   Sit on the edge of your bed if possible, or sit up as far as you can in bed or on a chair.  Hold the incentive spirometer in an upright position.  Breathe out normally.  Place the mouthpiece in your mouth and seal your lips tightly around it.  Breathe in slowly and as deeply as possible, raising the piston or the ball toward the top of the column.  Hold your breath for 3-5 seconds or for as long as possible. Allow the piston or ball to fall to the bottom of the column.  Remove the mouthpiece from your mouth and breathe out normally.  Rest for a few seconds and repeat Steps 1 through 7 at least 10 times every 1-2 hours when you are awake. Take your time and take a few normal breaths between deep breaths.  The spirometer may include an indicator to show your best effort. Use the indicator as a goal to work toward during each repetition.  After each set of 10 deep breaths, practice coughing to be sure your lungs are clear. If you have an incision (the cut made at the time of surgery), support your incision when coughing by placing a pillow or rolled up towels firmly against it. Once you are able to get out of bed, walk around indoors and cough well. You may stop using the incentive spirometer when instructed by your caregiver.  RISKS AND COMPLICATIONS  Take your time so you do not get dizzy or light-headed.  If you are in pain, you may need to take or ask for pain medication before doing incentive spirometry. It is harder to take a deep breath  if you are having pain. AFTER USE  Rest and  breathe slowly and easily.  It can be helpful to keep track of a log of your progress. Your caregiver can provide you with a simple table to help with this. If you are using the spirometer at home, follow these instructions: [place] IF:   You are having difficultly using the spirometer.  You have trouble using the spirometer as often as instructed.  Your pain medication is not giving enough relief while using the spirometer.  You develop fever of 100.5 F (38.1 C) or higher. SEEK IMMEDIATE MEDICAL CARE IF:   You cough up bloody sputum that had not been present before.  You develop fever of 102 F (38.9 C) or greater.  You develop worsening pain at or near the incision site. MAKE SURE YOU:   Understand these instructions.  Will watch your condition.  Will get help right away if you are not doing well or get worse. Document Released: 12/26/2006 Document Revised: 11/07/2011 Document Reviewed: 02/26/2007 ExitCare Patient Information 2014 Exit[employer], [place].   ________________________________________________________________________  WHAT IS A BLOOD TRANSFUSION? Blood Transfusion Information  A transfusion is the replacement of blood or some of its parts. Blood is made up of multiple cells which provide different functions.  Red blood cells carry oxygen and are used for blood loss replacement.  White blood cells fight against infection.  Platelets control bleeding.  Plasma helps clot blood.  Other blood products are available for specialized needs, such as hemophilia or other clotting disorders. BEFORE THE TRANSFUSION  Who gives blood for transfusions?   Healthy volunteers who are fully evaluated to make sure their blood is safe. This is blood bank blood. Transfusion therapy is the safest it has ever been in the practice of medicine. Before blood is taken from a donor, a complete history is taken to make sure that person has no history of diseases nor engages in  risky social behavior (examples are intravenous drug use or sexual activity with multiple partners). The donor's travel history is screened to minimize risk of transmitting infections, such as malaria. The donated blood is tested for signs of infectious diseases, such as HIV and hepatitis. The blood is then tested to be sure it is compatible with you in order to minimize the chance of a transfusion reaction. If you or a relative donates blood, this is often done in anticipation of surgery and is not appropriate for emergency situations. It takes many days to process the donated blood. RISKS AND COMPLICATIONS Although transfusion therapy is very safe and saves many lives, the main dangers of transfusion include:   Getting an infectious disease.  Developing a transfusion reaction. This is an allergic reaction to something in the blood you were given. Every precaution is taken to prevent this. The decision to have a blood transfusion has been considered carefully by your caregiver before blood is given. Blood is not given unless the benefits outweigh the risks. AFTER THE TRANSFUSION  Right after receiving a blood transfusion, you will usually feel much better and more energetic. This is especially true if your red blood cells have gotten low (anemic). The transfusion raises the level of the red blood cells which carry oxygen, and this usually causes an energy increase.  The nurse administering the transfusion will monitor you carefully for complications. HOME CARE INSTRUCTIONS  No special instructions are needed after a transfusion. You may find your energy is better. Speak with your caregiver about any limitations  on activity for underlying diseases you may have. SEEK MEDICAL CARE IF:   Your condition is not improving after your transfusion.  You develop redness or irritation at the intravenous (IV) site. SEEK IMMEDIATE MEDICAL CARE IF:  Any of the following symptoms occur over the next 12  hours:  Shaking chills.  You have a temperature by mouth above 102 F (38.9 C), not controlled by medicine.  Chest, back, or muscle pain.  People around you feel you are not acting correctly or are confused.  Shortness of breath or difficulty breathing.  Dizziness and fainting.  You get a rash or develop hives.  You have a decrease in urine output.  Your urine turns a dark color or changes to pink, red, or brown. Any of the following symptoms occur over the next 10 days:  You have a temperature by mouth above 102 F (38.9 C), not controlled by medicine.  Shortness of breath.  Weakness after normal activity.  The white part of the eye turns yellow (jaundice).  You have a decrease in the amount of urine or are urinating less often.  Your urine turns a dark color or changes to pink, red, or brown. Document Released: 08/12/2000 Document Revised: 11/07/2011 Document Reviewed: 03/31/2008 [facility] Patient Information 2014 [place], [place].  _______________________________________________________________________

## 2015-06-24 ENCOUNTER — Encounter (HOSPITAL_COMMUNITY): Payer: Self-pay

## 2015-06-24 ENCOUNTER — Encounter (HOSPITAL_COMMUNITY)
Admission: RE | Admit: 2015-06-24 | Discharge: 2015-06-24 | Disposition: A | Discharge status: Home / Self Care | Payer: PPO | Source: Ambulatory Visit | Attending: Orthopedic Surgery | Admitting: Orthopedic Surgery

## 2015-06-24 DIAGNOSIS — Z01818 Encounter for other preprocedural examination: Secondary | ICD-10-CM | POA: Insufficient documentation

## 2015-06-24 DIAGNOSIS — M179 Osteoarthritis of knee, unspecified: Secondary | ICD-10-CM | POA: Insufficient documentation

## 2015-06-24 HISTORY — DX: Personal history of urinary calculi: Z87.442

## 2015-06-24 HISTORY — DX: Nocturia: R35.1

## 2015-06-24 LAB — CBC
HCT: 42.9 % (ref 39.0–52.0)
Hemoglobin: 14.1 g/dL (ref 13.0–17.0)
MCH: 33.8 pg (ref 26.0–34.0)
MCHC: 32.9 g/dL (ref 30.0–36.0)
MCV: 102.9 fL — AB (ref 78.0–100.0)
PLATELETS: 269 10*3/uL (ref 150–400)
RBC: 4.17 MIL/uL — ABNORMAL LOW (ref 4.22–5.81)
RDW: 14.4 % (ref 11.5–15.5)
WBC: 13.4 10*3/uL — ABNORMAL HIGH (ref 4.0–10.5)

## 2015-06-24 LAB — URINALYSIS, ROUTINE W REFLEX MICROSCOPIC
Bilirubin Urine: NEGATIVE
GLUCOSE, UA: NEGATIVE mg/dL
Hgb urine dipstick: NEGATIVE
KETONES UR: NEGATIVE mg/dL
LEUKOCYTES UA: NEGATIVE
Nitrite: NEGATIVE
PH: 7 (ref 5.0–8.0)
Protein, ur: NEGATIVE mg/dL
SPECIFIC GRAVITY, URINE: 1.01 (ref 1.005–1.030)
Urobilinogen, UA: 0.2 mg/dL (ref 0.0–1.0)

## 2015-06-24 LAB — PROTIME-INR
INR: 1.04 (ref 0.00–1.49)
PROTHROMBIN TIME: 13.8 s (ref 11.6–15.2)

## 2015-06-24 LAB — COMPREHENSIVE METABOLIC PANEL
ALT: 20 U/L (ref 17–63)
ANION GAP: 6 (ref 5–15)
AST: 24 U/L (ref 15–41)
Albumin: 4 g/dL (ref 3.5–5.0)
Alkaline Phosphatase: 50 U/L (ref 38–126)
BUN: 25 mg/dL — ABNORMAL HIGH (ref 6–20)
CALCIUM: 9.9 mg/dL (ref 8.9–10.3)
CO2: 30 mmol/L (ref 22–32)
Chloride: 103 mmol/L (ref 101–111)
Creatinine, Ser: 1.52 mg/dL — ABNORMAL HIGH (ref 0.61–1.24)
GFR calc Af Amer: 45 mL/min — ABNORMAL LOW (ref >60)
GFR, EST NON AFRICAN AMERICAN: 39 mL/min — AB (ref >60)
Glucose, Bld: 108 mg/dL — ABNORMAL HIGH (ref 65–99)
Potassium: 5.1 mmol/L (ref 3.5–5.1)
SODIUM: 139 mmol/L (ref 135–145)
Total Bilirubin: 0.7 mg/dL (ref 0.3–1.2)
Total Protein: 6.8 g/dL (ref 6.5–8.1)

## 2015-06-24 LAB — SURGICAL PCR SCREEN
MRSA, PCR: NEGATIVE
STAPHYLOCOCCUS AUREUS: NEGATIVE

## 2015-06-24 LAB — APTT: aPTT: 29 seconds (ref 24–37)

## 2015-06-24 NOTE — Progress Notes (Signed)
cmet results faxed by epic to dr aluisio 

## 2015-06-25 NOTE — H&P (Signed)
TOTAL KNEE ADMISSION H&P  Patient is being admitted for right total knee arthroplasty.  Subjective:  Chief Complaint:right knee pain.  HPI: [name] [name]., 81 y.o. male, has a history of pain and functional disability in the right knee due to arthritis and has failed non-surgical conservative treatments for greater than 12 weeks to includeNSAID's and/or analgesics, corticosteriod injections, viscosupplementation injections and activity modification.  Onset of symptoms was gradual, starting >10 years ago with gradually worsening course since that time. The patient noted prior procedures on the knee to include  arthroscopy and menisectomy on the right knee(s).  Patient currently rates pain in the right knee(s) at 8 out of 10 with activity. Patient has night pain, worsening of pain with activity and weight bearing, pain that interferes with activities of daily living, pain with passive range of motion, crepitus and joint swelling.  Patient has evidence of periarticular osteophytes and joint space narrowing by imaging studies.  There is no active infection.  Patient Active Problem List   Diagnosis Date Noted  . Lateral meniscal tear 02/05/2015  . Pneumonia 10/28/2013  . Common cold 10/24/2013  . Hyponatremia 10/20/2013  . Volume depletion 10/20/2013  . Abdominal pain 10/20/2013  . Pancreatitis 10/20/2013  . Dehydration 10/20/2013  . Weakness 10/19/2013  . Hypertension   . 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 Hypothyroidism (acquired)   . Anemia, B12 deficiency   . Colitis   . Arthritis of shoulder region, right 12/15/2011   Past Medical History  Diagnosis Date  . Hypertension   . Arthritis     Status post left total replacement 8 2011  . Pituitary mass ([place]) 2000    S/p transphenoidal excision 05/1999 ([facility])  . 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 Hypothyroidism (acquired) 2000  . Anemia, B12 deficiency 2000   . Hyperlipidemia 2003  . Vitamin D deficiency 2009  . History of renal cell carcinoma 1994    Status post right nephrectomy  . BPH (benign prostatic hyperplasia) 2013  . Glaucoma 2007    Status post left trabeculectomy 2007  . History of colon polyps     Colonoscopy 2001  . Allergic rhinitis     Prior allergy shots 20 years  . Colitis 2014  . Anemia   . Cancer ([place])     renal cell ca and skin cancer   . Difficult intubation 1994    surgery had to be  stopped due to injury to "throat"  . Difficult intubation 1994    no trouble since.  Required nasotracheal intubation  '02 [facility]   . Nocturia   . History of kidney stones     Past Surgical History  Procedure Laterality Date  . Tonsillectomy    . Eye surgery  over last 6 yrs.      trabeculectomy...   . Eye surgery   cat ext ou  . Brain surgery  2000    pituatary gland removed .  Marland Kitchen Joint replacement  2011    knee left  . Cholecystectomy  1984  . Reverse shoulder arthroplasty  12/15/2011    Procedure: REVERSE SHOULDER ARTHROPLASTY;  Surgeon: Marin Shutter, MD;  Location: [place];  Service: Orthopedics;  Laterality: Right;  right total reverse shoulder  . Nephrectomy Right 1994    Renal cell  . Transsphenoidal excision pituitary tumor  05/1999    A.Tommi Rumps, M.D.([facility])  . Ectropion surgery Bilateral 2006  . Mohs procedure  for skin cancer on nose   . Knee arthroscopy Right 02/06/2015    Procedure: RIGHT ARTHROSCOPY KNEE WITH LATERAL MENSICAL  DEBRIDEMENT;  Surgeon: Gaynelle Arabian, MD;  Location: [facility];  Service: Orthopedics;  Laterality: Right;      Current outpatient prescriptions:  .  acetaminophen (TYLENOL) 500 MG tablet, Take 1,000 mg by mouth every 6 (six) hours as needed for moderate pain., Disp: , Rfl:  .  Cholecalciferol (VITAMIN D3) 5000 UNITS TABS, Take 5,000 Units by mouth at bedtime., Disp: , Rfl:  .  COD LIVER OIL PO, Take 2 tablets by mouth daily., Disp: , Rfl:  .  Coenzyme Q10 300 MG CAPS, Take 1 capsule  by mouth every morning. , Disp: , Rfl:  .  Cyanocobalamin (B-12) 2500 MCG TABS, Take 1 tablet by mouth every morning. , Disp: , Rfl:  .  docusate sodium (COLACE) 100 MG capsule, Take 100 mg by mouth at bedtime. , Disp: , Rfl:  .  finasteride (PROSCAR) 5 MG tablet, Take 5 mg by mouth every morning. , Disp: , Rfl:  .  Flaxseed MISC, 15 mLs by Does not apply route every morning. , Disp: , Rfl:  .  levothyroxine (SYNTHROID, LEVOTHROID) 88 MCG tablet, Take 88 mcg by mouth daily., Disp: , Rfl:  .  Multiple Vitamins-Minerals (MULTIVITAMINS THER. W/MINERALS) TABS tablet, Take 1 tablet by mouth at bedtime. , Disp: , Rfl:  .  olmesartan (BENICAR) 20 MG tablet, Take 20 mg by mouth daily., Disp: , Rfl:  .  pantoprazole (PROTONIX) 40 MG tablet, Take 40 mg by mouth daily as needed (indigestion)., Disp: , Rfl:  .  Polyethyl Glycol-Propyl Glycol (SYSTANE OP), Apply 2 drops to eye at bedtime as needed (dry eyes). As needed, Disp: , Rfl:  .  pravastatin (PRAVACHOL) 80 MG tablet, Take 80 mg by mouth every evening. , Disp: , Rfl:  .  predniSONE (DELTASONE) 5 MG tablet, Take 5 mg in the morning and 2.5 in the evening, Disp: , Rfl:  .  PRESCRIPTION MEDICATION, Place 1 drop into both eyes 4 (four) times daily. Serum eye drops mixed with artifical tears. Pt gets mixed at [facility]. Dr. Edilia Bo, Disp: , Rfl:  .  TESTOSTERONE IM, Inject 0.15 mLs into the muscle every 7 (seven) days. On Fridays, Disp: , Rfl:    Allergies  Allergen Reactions  . Percocet [Oxycodone-Acetaminophen] Other (See Comments)    Just doesn't like it  . Diovan [Valsartan] Rash    Social History  Substance Use Topics  . Smoking status: Former Smoker    Types: Cigarettes, Pipe    Quit date: 09/26/1988  . Smokeless tobacco: Never Used  . Alcohol Use: Yes     Comment: 1.5-2 ounces of vodka nightly     Family History  Problem Relation Age of Onset  . Anesthesia problems Neg Hx   . Hypotension Neg Hx   . Malignant hyperthermia Neg Hx   .  Pseudochol deficiency Neg Hx   . Lung cancer Father      Review of Systems  Constitutional: Negative.   HENT: Positive for hearing loss. Negative for congestion, ear discharge, ear pain, nosebleeds, sore throat and tinnitus.   Eyes: Negative.   Respiratory: Negative.  Negative for stridor.   Cardiovascular: Negative.   Gastrointestinal: Positive for constipation. Negative for heartburn, nausea, vomiting, abdominal pain, diarrhea, blood in stool and melena.  Genitourinary: Positive for frequency. Negative for dysuria, urgency, hematuria and flank pain.  Musculoskeletal: Positive for back pain and  joint pain. Negative for myalgias, falls and neck pain.  Skin: Negative.   Neurological: Negative.  Negative for headaches.  Endo/Heme/Allergies: Negative.   Psychiatric/Behavioral: Negative.     Objective:  Physical Exam  Constitutional: He is oriented to person, place, and time. He appears well-developed and well-nourished. No distress.  HENT:  Head: Normocephalic and atraumatic.  Right Ear: External ear normal.  Left Ear: External ear normal.  Nose: Nose normal.  Mouth/Throat: Oropharynx is clear and moist.  Eyes: Conjunctivae and EOM are normal.  Neck: Normal range of motion. Neck supple.  Cardiovascular: Normal rate, regular rhythm, normal heart sounds and intact distal pulses.   No murmur heard. Respiratory: Effort normal and breath sounds normal. No respiratory distress. He has no wheezes.  GI: Soft. Bowel sounds are normal. He exhibits no distension. There is no tenderness.  Musculoskeletal:       Right hip: Normal.       Left hip: Normal.       Right knee: He exhibits decreased range of motion, swelling and effusion. He exhibits no erythema. Tenderness found. Medial joint line and lateral joint line tenderness noted.       Left knee: Normal.  Right knee range of motion is about 5 to 125 with moderate crepitus on range of motion. He is very tender laterally. There is minimal  medial tenderness and no instability.   Neurological: He is alert and oriented to person, place, and time. He has normal strength and normal reflexes. No sensory deficit.  Skin: No rash noted. He is not diaphoretic. No erythema.  Psychiatric: He has a normal mood and affect. His behavior is normal.    Vitals Weight: 185 lb Height: 71in Body Surface Area: 2.04 m Body Mass Index: 25.8 kg/m  Pulse: 64 (Regular)  BP: 142/78 (Sitting, Left Arm, Standard)  Imaging Review Plain radiographs demonstrate severe degenerative joint disease of the right knee(s). The overall alignment issignificant valgus. The bone quality appears to be fair for age and reported activity level.  Assessment/Plan:  End stage primary osteoarthritis, right knee   The patient history, physical examination, clinical judgment of the provider and imaging studies are consistent with end stage degenerative joint disease of the right knee(s) and total knee arthroplasty is deemed medically necessary. The treatment options including medical management, injection therapy arthroscopy and arthroplasty were discussed at length. The risks and benefits of total knee arthroplasty were presented and reviewed. The risks due to aseptic loosening, infection, stiffness, patella tracking problems, thromboembolic complications and other imponderables were discussed. The patient acknowledged the explanation, agreed to proceed with the plan and consent was signed. Patient is being admitted for inpatient treatment for surgery, pain control, PT, OT, prophylactic antibiotics, VTE prophylaxis, progressive ambulation and ADL's and discharge planning. The patient is planning to be discharged to skilled nursing facility (to return to [place] where he lives)   Wants only Tylenol-no narcotics due to severe constipation previously DIFFICULT INTUBATION! PCP: Dr. [name] [name]    Ardeen Jourdain, PA-C

## 2015-06-26 ENCOUNTER — Encounter (HOSPITAL_COMMUNITY): Payer: Self-pay

## 2015-06-29 ENCOUNTER — Inpatient Hospital Stay (HOSPITAL_COMMUNITY): Payer: PPO | Admitting: Anesthesiology

## 2015-06-29 ENCOUNTER — Encounter (HOSPITAL_COMMUNITY): Payer: Self-pay | Admitting: *Deleted

## 2015-06-29 ENCOUNTER — Inpatient Hospital Stay (HOSPITAL_COMMUNITY)
Admission: RE | Admit: 2015-06-29 | Discharge: 2015-07-01 | DRG: 470 | Disposition: A | Discharge status: Skilled Nursing Facility | Payer: PPO | Source: Ambulatory Visit | Attending: Orthopedic Surgery | Admitting: Orthopedic Surgery

## 2015-06-29 ENCOUNTER — Encounter (HOSPITAL_COMMUNITY)
Admission: RE | Disposition: A | Discharge status: Skilled Nursing Facility | Payer: Self-pay | Source: Ambulatory Visit | Attending: Orthopedic Surgery

## 2015-06-29 DIAGNOSIS — M659 Synovitis and tenosynovitis, unspecified: Secondary | ICD-10-CM | POA: Diagnosis present

## 2015-06-29 DIAGNOSIS — M25561 Pain in right knee: Secondary | ICD-10-CM | POA: Diagnosis present

## 2015-06-29 DIAGNOSIS — Z96619 Presence of unspecified artificial shoulder joint: Secondary | ICD-10-CM | POA: Diagnosis present

## 2015-06-29 DIAGNOSIS — E559 Vitamin D deficiency, unspecified: Secondary | ICD-10-CM | POA: Diagnosis present

## 2015-06-29 DIAGNOSIS — K5900 Constipation, unspecified: Secondary | ICD-10-CM | POA: Diagnosis present

## 2015-06-29 DIAGNOSIS — Z85528 Personal history of other malignant neoplasm of kidney: Secondary | ICD-10-CM | POA: Diagnosis not present

## 2015-06-29 DIAGNOSIS — Z85828 Personal history of other malignant neoplasm of skin: Secondary | ICD-10-CM | POA: Diagnosis not present

## 2015-06-29 DIAGNOSIS — M1711 Unilateral primary osteoarthritis, right knee: Principal | ICD-10-CM | POA: Diagnosis present

## 2015-06-29 DIAGNOSIS — E039 Hypothyroidism, unspecified: Secondary | ICD-10-CM | POA: Diagnosis present

## 2015-06-29 DIAGNOSIS — E785 Hyperlipidemia, unspecified: Secondary | ICD-10-CM | POA: Diagnosis present

## 2015-06-29 DIAGNOSIS — Z01812 Encounter for preprocedural laboratory examination: Secondary | ICD-10-CM

## 2015-06-29 DIAGNOSIS — Z9049 Acquired absence of other specified parts of digestive tract: Secondary | ICD-10-CM | POA: Diagnosis not present

## 2015-06-29 DIAGNOSIS — M1710 Unilateral primary osteoarthritis, unspecified knee: Secondary | ICD-10-CM | POA: Diagnosis present

## 2015-06-29 DIAGNOSIS — M179 Osteoarthritis of knee, unspecified: Secondary | ICD-10-CM | POA: Diagnosis present

## 2015-06-29 DIAGNOSIS — Z79899 Other long term (current) drug therapy: Secondary | ICD-10-CM

## 2015-06-29 DIAGNOSIS — Z905 Acquired absence of kidney: Secondary | ICD-10-CM

## 2015-06-29 DIAGNOSIS — Z96652 Presence of left artificial knee joint: Secondary | ICD-10-CM | POA: Diagnosis present

## 2015-06-29 DIAGNOSIS — I10 Essential (primary) hypertension: Secondary | ICD-10-CM | POA: Diagnosis not present

## 2015-06-29 DIAGNOSIS — Z87891 Personal history of nicotine dependence: Secondary | ICD-10-CM | POA: Diagnosis not present

## 2015-06-29 HISTORY — PX: TOTAL KNEE ARTHROPLASTY: SHX125

## 2015-06-29 LAB — TYPE AND SCREEN
ABO/RH(D): O POS
Antibody Screen: NEGATIVE

## 2015-06-29 SURGERY — ARTHROPLASTY, KNEE, TOTAL
Anesthesia: Spinal | Site: Knee | Laterality: Right

## 2015-06-29 MED ORDER — LACTATED RINGERS IV SOLN
INTRAVENOUS | Status: DC
  Administered 2015-06-29: 14:00:00 via INTRAVENOUS
  Administered 2015-06-29: 1000 mL via INTRAVENOUS

## 2015-06-29 MED ORDER — POLYETHYLENE GLYCOL 3350 17 G PO PACK: 17.0000 g | PACK | Freq: Every day | ORAL | Status: DC | PRN

## 2015-06-29 MED ORDER — DOCUSATE SODIUM 100 MG PO CAPS
100.0000 mg | ORAL_CAPSULE | Freq: Two times a day (BID) | ORAL | Status: DC
  Administered 2015-06-29 – 2015-07-01 (×4): 100 mg via ORAL

## 2015-06-29 MED ORDER — PREDNISONE 5 MG PO TABS: 5.0000 mg | ORAL_TABLET | Freq: Every evening | ORAL | Status: DC

## 2015-06-29 MED ORDER — PREDNISONE 10 MG PO TABS
10.0000 mg | ORAL_TABLET | Freq: Two times a day (BID) | ORAL | Status: AC
  Administered 2015-06-30 (×2): 10 mg via ORAL
  Filled 2015-06-29 (×2): qty 1

## 2015-06-29 MED ORDER — METOCLOPRAMIDE HCL 5 MG/ML IJ SOLN: 5.0000 mg | Freq: Three times a day (TID) | INTRAMUSCULAR | Status: DC | PRN

## 2015-06-29 MED ORDER — BUPIVACAINE LIPOSOME 1.3 % IJ SUSP
20.0000 mL | Freq: Once | INTRAMUSCULAR | Status: DC
  Filled 2015-06-29: qty 20

## 2015-06-29 MED ORDER — HYDROMORPHONE HCL 2 MG PO TABS
2.0000 mg | ORAL_TABLET | ORAL | Status: DC | PRN
  Administered 2015-06-29 – 2015-06-30 (×2): 2 mg via ORAL
  Filled 2015-06-29 (×3): qty 1

## 2015-06-29 MED ORDER — CEFAZOLIN SODIUM-DEXTROSE 2-3 GM-% IV SOLR
2.0000 g | Freq: Four times a day (QID) | INTRAVENOUS | Status: AC
  Administered 2015-06-29 – 2015-06-30 (×2): 2 g via INTRAVENOUS
  Filled 2015-06-29 (×2): qty 50

## 2015-06-29 MED ORDER — BUPIVACAINE LIPOSOME 1.3 % IJ SUSP
INTRAMUSCULAR | Status: DC | PRN
  Administered 2015-06-29: 20 mL

## 2015-06-29 MED ORDER — SODIUM CHLORIDE 0.9 % IJ SOLN
INTRAMUSCULAR | Status: DC | PRN
  Administered 2015-06-29: 30 mL

## 2015-06-29 MED ORDER — IRBESARTAN 150 MG PO TABS
150.0000 mg | ORAL_TABLET | Freq: Every day | ORAL | Status: DC
  Administered 2015-07-01: 150 mg via ORAL
  Filled 2015-06-29 (×2): qty 1

## 2015-06-29 MED ORDER — ACETAMINOPHEN 650 MG RE SUPP: 650.0000 mg | Freq: Four times a day (QID) | RECTAL | Status: DC | PRN

## 2015-06-29 MED ORDER — BUPIVACAINE IN DEXTROSE 0.75-8.25 % IT SOLN
INTRATHECAL | Status: DC | PRN
  Administered 2015-06-29: 13.5 mg via INTRATHECAL

## 2015-06-29 MED ORDER — ACETAMINOPHEN 500 MG PO TABS
1000.0000 mg | ORAL_TABLET | Freq: Four times a day (QID) | ORAL | Status: AC
  Administered 2015-06-29 – 2015-06-30 (×4): 1000 mg via ORAL
  Filled 2015-06-29 (×4): qty 2

## 2015-06-29 MED ORDER — SODIUM CHLORIDE 0.9 % IJ SOLN
INTRAMUSCULAR | Status: AC
  Filled 2015-06-29: qty 50

## 2015-06-29 MED ORDER — LEVOTHYROXINE SODIUM 88 MCG PO TABS
88.0000 ug | ORAL_TABLET | Freq: Every day | ORAL | Status: DC
  Administered 2015-06-30 – 2015-07-01 (×2): 88 ug via ORAL
  Filled 2015-06-29 (×3): qty 1

## 2015-06-29 MED ORDER — BUPIVACAINE HCL (PF) 0.25 % IJ SOLN
INTRAMUSCULAR | Status: AC
  Filled 2015-06-29: qty 30

## 2015-06-29 MED ORDER — CEFAZOLIN SODIUM-DEXTROSE 2-3 GM-% IV SOLR
INTRAVENOUS | Status: AC
  Filled 2015-06-29: qty 50

## 2015-06-29 MED ORDER — PROPOFOL 10 MG/ML IV BOLUS
INTRAVENOUS | Status: AC
Start: 2015-06-29 — End: 2015-06-29
  Filled 2015-06-29: qty 20

## 2015-06-29 MED ORDER — METHOCARBAMOL 500 MG PO TABS
500.0000 mg | ORAL_TABLET | Freq: Four times a day (QID) | ORAL | Status: DC | PRN
  Administered 2015-06-29 – 2015-07-01 (×3): 500 mg via ORAL
  Filled 2015-06-29 (×3): qty 1

## 2015-06-29 MED ORDER — FINASTERIDE 5 MG PO TABS
5.0000 mg | ORAL_TABLET | Freq: Every morning | ORAL | Status: DC
  Administered 2015-06-30 – 2015-07-01 (×2): 5 mg via ORAL
  Filled 2015-06-29 (×2): qty 1

## 2015-06-29 MED ORDER — LIDOCAINE HCL (CARDIAC) 20 MG/ML IV SOLN
INTRAVENOUS | Status: DC | PRN
  Administered 2015-06-29: 25 mg via INTRAVENOUS

## 2015-06-29 MED ORDER — PREDNISONE 5 MG PO TABS: 5.0000 mg | ORAL_TABLET | Freq: Every day | ORAL | Status: DC

## 2015-06-29 MED ORDER — PREDNISONE 2.5 MG PO TABS
7.5000 mg | ORAL_TABLET | Freq: Two times a day (BID) | ORAL | Status: DC
  Administered 2015-07-01: 7.5 mg via ORAL
  Filled 2015-06-29 (×2): qty 1

## 2015-06-29 MED ORDER — METOCLOPRAMIDE HCL 10 MG PO TABS: 5.0000 mg | ORAL_TABLET | Freq: Three times a day (TID) | ORAL | Status: DC | PRN

## 2015-06-29 MED ORDER — ONDANSETRON HCL 4 MG PO TABS: 4.0000 mg | ORAL_TABLET | Freq: Four times a day (QID) | ORAL | Status: DC | PRN

## 2015-06-29 MED ORDER — PANTOPRAZOLE SODIUM 40 MG PO TBEC: 40.0000 mg | DELAYED_RELEASE_TABLET | Freq: Every day | ORAL | Status: DC | PRN

## 2015-06-29 MED ORDER — PROPOFOL 10 MG/ML IV BOLUS
INTRAVENOUS | Status: AC
  Filled 2015-06-29: qty 20

## 2015-06-29 MED ORDER — FLEET ENEMA 7-19 GM/118ML RE ENEM: 1.0000 | ENEMA | Freq: Once | RECTAL | Status: DC | PRN

## 2015-06-29 MED ORDER — ACETAMINOPHEN 10 MG/ML IV SOLN
INTRAVENOUS | Status: AC
  Filled 2015-06-29: qty 100

## 2015-06-29 MED ORDER — PRAVASTATIN SODIUM 80 MG PO TABS
80.0000 mg | ORAL_TABLET | Freq: Every evening | ORAL | Status: DC
  Administered 2015-06-29 – 2015-06-30 (×2): 80 mg via ORAL
  Filled 2015-06-29 (×3): qty 1

## 2015-06-29 MED ORDER — DIPHENHYDRAMINE HCL 12.5 MG/5ML PO ELIX: 12.5000 mg | ORAL_SOLUTION | ORAL | Status: DC | PRN

## 2015-06-29 MED ORDER — PREDNISONE 5 MG PO TABS
7.5000 mg | ORAL_TABLET | Freq: Every day | ORAL | Status: DC
  Filled 2015-06-29: qty 1

## 2015-06-29 MED ORDER — MORPHINE SULFATE (PF) 2 MG/ML IV SOLN: 1.0000 mg | INTRAVENOUS | Status: DC | PRN

## 2015-06-29 MED ORDER — TRAMADOL HCL 50 MG PO TABS
50.0000 mg | ORAL_TABLET | Freq: Four times a day (QID) | ORAL | Status: DC | PRN
  Administered 2015-06-29 – 2015-06-30 (×3): 100 mg via ORAL
  Filled 2015-06-29 (×3): qty 2

## 2015-06-29 MED ORDER — DEXAMETHASONE SODIUM PHOSPHATE 10 MG/ML IJ SOLN
INTRAMUSCULAR | Status: AC
  Filled 2015-06-29: qty 1

## 2015-06-29 MED ORDER — FENTANYL CITRATE (PF) 100 MCG/2ML IJ SOLN
INTRAMUSCULAR | Status: AC
  Filled 2015-06-29: qty 4

## 2015-06-29 MED ORDER — SODIUM CHLORIDE 0.9 % IV SOLN
1000.0000 mg | INTRAVENOUS | Status: AC
  Administered 2015-06-29: 1000 mg via INTRAVENOUS
  Filled 2015-06-29: qty 10

## 2015-06-29 MED ORDER — PREDNISONE 2.5 MG PO TABS: 7.5000 mg | ORAL_TABLET | Freq: Every day | ORAL | Status: DC

## 2015-06-29 MED ORDER — ACETAMINOPHEN 10 MG/ML IV SOLN
1000.0000 mg | Freq: Once | INTRAVENOUS | Status: AC
  Administered 2015-06-29: 1000 mg via INTRAVENOUS

## 2015-06-29 MED ORDER — ONDANSETRON HCL 4 MG/2ML IJ SOLN
INTRAMUSCULAR | Status: AC
  Filled 2015-06-29: qty 2

## 2015-06-29 MED ORDER — FENTANYL CITRATE (PF) 100 MCG/2ML IJ SOLN: 25.0000 ug | INTRAMUSCULAR | Status: DC | PRN

## 2015-06-29 MED ORDER — DEXAMETHASONE SODIUM PHOSPHATE 4 MG/ML IJ SOLN: INTRAMUSCULAR | Status: DC | PRN

## 2015-06-29 MED ORDER — CEFAZOLIN SODIUM-DEXTROSE 2-3 GM-% IV SOLR
2.0000 g | INTRAVENOUS | Status: AC
  Administered 2015-06-29: 2 g via INTRAVENOUS

## 2015-06-29 MED ORDER — CHLORHEXIDINE GLUCONATE 4 % EX LIQD: 60.0000 mL | Freq: Once | CUTANEOUS | Status: DC

## 2015-06-29 MED ORDER — PHENYLEPHRINE HCL 10 MG/ML IJ SOLN
INTRAMUSCULAR | Status: DC | PRN
  Administered 2015-06-29: 80 ug via INTRAVENOUS

## 2015-06-29 MED ORDER — FENTANYL CITRATE (PF) 100 MCG/2ML IJ SOLN
INTRAMUSCULAR | Status: DC | PRN
  Administered 2015-06-29 (×2): 50 ug via INTRAVENOUS

## 2015-06-29 MED ORDER — DEXAMETHASONE SODIUM PHOSPHATE 10 MG/ML IJ SOLN
10.0000 mg | Freq: Once | INTRAMUSCULAR | Status: AC
  Administered 2015-06-29: 10 mg via INTRAVENOUS

## 2015-06-29 MED ORDER — PROPOFOL 500 MG/50ML IV EMUL
INTRAVENOUS | Status: DC | PRN
  Administered 2015-06-29: 75 ug/kg/min via INTRAVENOUS

## 2015-06-29 MED ORDER — ACETAMINOPHEN 325 MG PO TABS
650.0000 mg | ORAL_TABLET | Freq: Four times a day (QID) | ORAL | Status: DC | PRN
  Administered 2015-06-30 – 2015-07-01 (×2): 650 mg via ORAL
  Filled 2015-06-29 (×2): qty 2

## 2015-06-29 MED ORDER — SODIUM CHLORIDE 0.9 % IV SOLN: INTRAVENOUS | Status: DC

## 2015-06-29 MED ORDER — ONDANSETRON HCL 4 MG/2ML IJ SOLN
INTRAMUSCULAR | Status: DC | PRN
Start: 2015-06-29 — End: 2015-06-29
  Administered 2015-06-29: 4 mg via INTRAVENOUS

## 2015-06-29 MED ORDER — LIDOCAINE HCL (CARDIAC) 20 MG/ML IV SOLN
INTRAVENOUS | Status: AC
  Filled 2015-06-29: qty 5

## 2015-06-29 MED ORDER — PHENYLEPHRINE 40 MCG/ML (10ML) SYRINGE FOR IV PUSH (FOR BLOOD PRESSURE SUPPORT)
PREFILLED_SYRINGE | INTRAVENOUS | Status: AC
  Filled 2015-06-29: qty 10

## 2015-06-29 MED ORDER — PREDNISONE 20 MG PO TABS
20.0000 mg | ORAL_TABLET | Freq: Once | ORAL | Status: AC
  Administered 2015-06-29: 20 mg via ORAL
  Filled 2015-06-29: qty 1

## 2015-06-29 MED ORDER — PREDNISONE 10 MG PO TABS
10.0000 mg | ORAL_TABLET | Freq: Two times a day (BID) | ORAL | Status: DC
  Filled 2015-06-29: qty 1

## 2015-06-29 MED ORDER — BISACODYL 10 MG RE SUPP: 10.0000 mg | Freq: Every day | RECTAL | Status: DC | PRN

## 2015-06-29 MED ORDER — MENTHOL 3 MG MT LOZG: 1.0000 | LOZENGE | OROMUCOSAL | Status: DC | PRN

## 2015-06-29 MED ORDER — BUPIVACAINE HCL 0.25 % IJ SOLN
INTRAMUSCULAR | Status: DC | PRN
  Administered 2015-06-29: 20 mL

## 2015-06-29 MED ORDER — ONDANSETRON HCL 4 MG/2ML IJ SOLN: 4.0000 mg | Freq: Four times a day (QID) | INTRAMUSCULAR | Status: DC | PRN

## 2015-06-29 MED ORDER — PREDNISONE 5 MG PO TABS: 7.5000 mg | ORAL_TABLET | Freq: Every day | ORAL | Status: DC

## 2015-06-29 MED ORDER — SODIUM CHLORIDE 0.9 % IV SOLN
INTRAVENOUS | Status: DC
  Administered 2015-06-29 – 2015-06-30 (×2): via INTRAVENOUS

## 2015-06-29 MED ORDER — METHOCARBAMOL 1000 MG/10ML IJ SOLN
500.0000 mg | Freq: Four times a day (QID) | INTRAVENOUS | Status: DC | PRN
  Administered 2015-06-29: 500 mg via INTRAVENOUS
  Filled 2015-06-29 (×2): qty 5

## 2015-06-29 MED ORDER — APIXABAN 2.5 MG PO TABS
2.5000 mg | ORAL_TABLET | Freq: Two times a day (BID) | ORAL | Status: DC
  Administered 2015-06-30 – 2015-07-01 (×3): 2.5 mg via ORAL
  Filled 2015-06-29 (×5): qty 1

## 2015-06-29 MED ORDER — PHENOL 1.4 % MT LIQD: 1.0000 | OROMUCOSAL | Status: DC | PRN

## 2015-06-29 SURGICAL SUPPLY — 51 items
BAG DECANTER FOR FLEXI CONT (MISCELLANEOUS) ×1 IMPLANT
BAG SPEC THK2 15X12 ZIP CLS (MISCELLANEOUS) ×1
BAG ZIPLOCK 12X15 (MISCELLANEOUS) ×3 IMPLANT
BANDAGE ELASTIC 6 VELCRO ST LF (GAUZE/BANDAGES/DRESSINGS) ×3 IMPLANT
BLADE SAG 18X100X1.27 (BLADE) ×3 IMPLANT
BLADE SAW SGTL 11.0X1.19X90.0M (BLADE) ×3 IMPLANT
BOWL SMART MIX CTS (DISPOSABLE) ×3 IMPLANT
CAP KNEE TOTAL 3 SIGMA ×2 IMPLANT
CEMENT HV SMART SET (Cement) ×6 IMPLANT
CLOSURE WOUND 1/2 X4 (GAUZE/BANDAGES/DRESSINGS) ×1
CLOTH BEACON ORANGE TIMEOUT ST (SAFETY) ×3 IMPLANT
CUFF TOURN SGL QUICK 34 (TOURNIQUET CUFF) ×3
CUFF TRNQT CYL 34X4X40X1 (TOURNIQUET CUFF) ×1 IMPLANT
DECANTER SPIKE VIAL GLASS SM (MISCELLANEOUS) ×3 IMPLANT
DRAPE U-SHAPE 47X51 STRL (DRAPES) ×3 IMPLANT
DRSG ADAPTIC 3X8 NADH LF (GAUZE/BANDAGES/DRESSINGS) ×3 IMPLANT
DRSG PAD ABDOMINAL 8X10 ST (GAUZE/BANDAGES/DRESSINGS) ×3 IMPLANT
DURAPREP 26ML APPLICATOR (WOUND CARE) ×3 IMPLANT
ELECT REM PT RETURN 9FT ADLT (ELECTROSURGICAL) ×3
ELECTRODE REM PT RTRN 9FT ADLT (ELECTROSURGICAL) ×1 IMPLANT
EVACUATOR 1/8 PVC DRAIN (DRAIN) ×3 IMPLANT
GAUZE SPONGE 4X4 12PLY STRL (GAUZE/BANDAGES/DRESSINGS) ×3 IMPLANT
GLOVE BIO SURGEON STRL SZ7.5 (GLOVE) ×2 IMPLANT
GLOVE BIO SURGEON STRL SZ8 (GLOVE) ×3 IMPLANT
GLOVE BIOGEL PI IND STRL 6.5 (GLOVE) IMPLANT
GLOVE BIOGEL PI IND STRL 8 (GLOVE) ×1 IMPLANT
GLOVE BIOGEL PI INDICATOR 6.5 (GLOVE)
GLOVE BIOGEL PI INDICATOR 8 (GLOVE) ×4
GLOVE SURG SS PI 6.5 STRL IVOR (GLOVE) IMPLANT
GOWN STRL REUS W/TWL LRG LVL3 (GOWN DISPOSABLE) ×3 IMPLANT
GOWN STRL REUS W/TWL XL LVL3 (GOWN DISPOSABLE) ×2 IMPLANT
HANDPIECE INTERPULSE COAX TIP (DISPOSABLE) ×3
IMMOBILIZER KNEE 20 (SOFTGOODS) ×3
IMMOBILIZER KNEE 20 THIGH 36 (SOFTGOODS) ×1 IMPLANT
MANIFOLD NEPTUNE II (INSTRUMENTS) ×3 IMPLANT
NS IRRIG 1000ML POUR BTL (IV SOLUTION) ×3 IMPLANT
PACK TOTAL KNEE CUSTOM (KITS) ×3 IMPLANT
PADDING CAST COTTON 6X4 STRL (CAST SUPPLIES) ×5 IMPLANT
POSITIONER SURGICAL ARM (MISCELLANEOUS) ×3 IMPLANT
SET HNDPC FAN SPRY TIP SCT (DISPOSABLE) ×1 IMPLANT
STRIP CLOSURE SKIN 1/2X4 (GAUZE/BANDAGES/DRESSINGS) ×3 IMPLANT
SUT MNCRL AB 4-0 PS2 18 (SUTURE) ×3 IMPLANT
SUT VIC AB 2-0 CT1 27 (SUTURE) ×9
SUT VIC AB 2-0 CT1 TAPERPNT 27 (SUTURE) ×3 IMPLANT
SUT VLOC 180 0 24IN GS25 (SUTURE) ×3 IMPLANT
SYR 50ML LL SCALE MARK (SYRINGE) ×3 IMPLANT
TRAY FOLEY W/METER SILVER 14FR (SET/KITS/TRAYS/PACK) ×1 IMPLANT
TRAY FOLEY W/METER SILVER 16FR (SET/KITS/TRAYS/PACK) ×3 IMPLANT
WATER STERILE IRR 1500ML POUR (IV SOLUTION) ×3 IMPLANT
WRAP KNEE MAXI GEL POST OP (GAUZE/BANDAGES/DRESSINGS) ×3 IMPLANT
YANKAUER SUCT BULB TIP 10FT TU (MISCELLANEOUS) ×1 IMPLANT

## 2015-06-29 NOTE — Op Note (Signed)
Pre-operative diagnosis- Osteoarthritis  Right knee(s)  Post-operative diagnosis- Osteoarthritis Right knee(s)  Procedure-  Right  Total Knee Arthroplasty  Surgeon- [name] Plover. Noell Lorensen, MD  Assistant- Arlee Muslim, PA-C   Anesthesia-  Spinal  EBL-* No blood loss amount entered *   Drains Hemovac  Tourniquet time-  Total Tourniquet Time Documented: Thigh (Right) - 30 minutes Total: Thigh (Right) - 30 minutes     Complications- None  Condition-PACU - hemodynamically stable.   Brief Clinical Note  [name] [name]. is a 81 y.o. year old male with end stage OA of his right knee with progressively worsening pain and dysfunction. He has constant pain, with activity and at rest and significant functional deficits with difficulties even with ADLs. He has had extensive non-op management including analgesics, injections of cortisone and viscosupplements, and home exercise program, but remains in significant pain with significant dysfunction. Radiographs show bone on bone lateral compartment. He presents now for rightt Total Knee Arthroplasty.    Procedure in detail---   The patient is brought into the operating room and positioned supine on the operating table. After successful administration of  Spinal,   a tourniquet is placed high on the  Right thigh(s) and the lower extremity is prepped and draped in the usual sterile fashion. Time out is performed by the operating team and then the  Right lower extremity is wrapped in Esmarch, knee flexed and the tourniquet inflated to 300 mmHg.       A midline incision is made with a ten blade through the subcutaneous tissue to the level of the extensor mechanism. A fresh blade is used to make a medial parapatellar arthrotomy. Soft tissue over the proximal medial tibia is subperiosteally elevated to the joint line with a knife and into the semimembranosus bursa with a Cobb elevator. Soft tissue over the proximal lateral tibia is elevated with attention  being paid to avoiding the patellar tendon on the tibial tubercle. The patella is everted, knee flexed 90 degrees and the ACL and PCL are removed. Findings are bone on bone lateral compartment with significant synovitis.        The drill is used to create a starting hole in the distal femur and the canal is thoroughly irrigated with sterile saline to remove the fatty contents. The 5 degree Right  valgus alignment guide is placed into the femoral canal and the distal femoral cutting block is pinned to remove 10 mm off the distal femur. Resection is made with an oscillating saw.      The tibia is subluxed forward and the menisci are removed. The extramedullary alignment guide is placed referencing proximally at the medial aspect of the tibial tubercle and distally along the second metatarsal axis and tibial crest. The block is pinned to remove 28mm off the more deficient lateral  side. Resection is made with an oscillating saw. Size 4is the most appropriate size for the tibia and the proximal tibia is prepared with the modular drill and keel punch for that size.      The femoral sizing guide is placed and size 4 is most appropriate. Rotation is marked off the epicondylar axis and confirmed by creating a rectangular flexion gap at 90 degrees. The size 4 cutting block is pinned in this rotation and the anterior, posterior and chamfer cuts are made with the oscillating saw. The intercondylar block is then placed and that cut is made.      Trial size 4 tibial component, trial size 4  posterior stabilized femur and a 10  mm posterior stabilized rotating platform insert trial is placed. Full extension is achieved with excellent varus/valgus and anterior/posterior balance throughout full range of motion. The patella is everted and thickness measured to be 27  mm. Free hand resection is taken to 15 mm, a 41 template is placed, lug holes are drilled, trial patella is placed, and it tracks normally. Osteophytes are removed  off the posterior femur with the trial in place. All trials are removed and the cut bone surfaces prepared with pulsatile lavage. Cement is mixed and once ready for implantation, the size 4 tibial implant, size  4 posterior stabilized femoral component, and the size 41 patella are cemented in place and the patella is held with the clamp. The trial insert is placed and the knee held in full extension. The Exparel (20 ml mixed with 30 ml saline) and .25% Bupivicaine, are injected into the extensor mechanism, posterior capsule, medial and lateral gutters and subcutaneous tissues.  All extruded cement is removed and once the cement is hard the permanent 10 mm posterior stabilized rotating platform insert is placed into the tibial tray.      The wound is copiously irrigated with saline solution and the extensor mechanism closed over a hemovac drain with #1 V-loc suture. The tourniquet is released for a total tourniquet time of 30  minutes. Flexion against gravity is 140 degrees and the patella tracks normally. Subcutaneous tissue is closed with 2.0 vicryl and subcuticular with running 4.0 Monocryl. The incision is cleaned and dried and steri-strips and a bulky sterile dressing are applied. The limb is placed into a knee immobilizer and the patient is awakened and transported to recovery in stable condition.      Please note that a surgical assistant was a medical necessity for this procedure in order to perform it in a safe and expeditious manner. Surgical assistant was necessary to retract the ligaments and vital neurovascular structures to prevent injury to them and also necessary for proper positioning of the limb to allow for anatomic placement of the prosthesis.   [name] Plover Serra Younan, MD    06/29/2015, 1:54 PM

## 2015-06-29 NOTE — Interval H&P Note (Signed)
History and Physical Interval Note:  06/29/2015 10:12 AM  [name] [name].  has presented today for surgery, with the diagnosis of RIGHT KNEE OA  The various methods of treatment have been discussed with the patient and family. After consideration of risks, benefits and other options for treatment, the patient has consented to  Procedure(s): TOTAL KNEE ARTHROPLASTY (Right) as a surgical intervention .  The patient's history has been reviewed, patient examined, no change in status, stable for surgery.  I have reviewed the patient's chart and labs.  Questions were answered to the patient's satisfaction.     [name]

## 2015-06-29 NOTE — Anesthesia Postprocedure Evaluation (Signed)
  Anesthesia Post-op Note  Patient: [name] [name].  Procedure(s) Performed: Procedure(s): TOTAL KNEE ARTHROPLASTY (Right)  Patient Location: PACU  Anesthesia Type: Spinal/MAC  Level of Consciousness: awake and alert   Airway and Oxygen Therapy: Patient Spontanous Breathing  Post-op Pain: Controlled  Post-op Assessment: Post-op Vital signs reviewed, Patient's Cardiovascular Status Stable and Respiratory Function Stable. Block receeding.  Post-op Vital Signs: Reviewed  Filed Vitals:   06/29/15 1445  BP: 141/71  Pulse: 60  Temp:   Resp: 16    Complications: No apparent anesthesia complications

## 2015-06-29 NOTE — Transfer of Care (Signed)
Immediate Anesthesia Transfer of Care Note  Patient: [name] [name].  Procedure(s) Performed: Procedure(s): TOTAL KNEE ARTHROPLASTY (Right)  Patient Location: PACU  Anesthesia Type:Spinal  Level of Consciousness: awake, alert  and oriented  Airway & Oxygen Therapy: Patient Spontanous Breathing and Patient connected to nasal cannula oxygen  Post-op Assessment: Report given to RN and Post -op Vital signs reviewed and stable  Post vital signs: Reviewed and stable  Last Vitals:  Filed Vitals:   06/29/15 0926  BP: 164/71  Pulse: 72  Temp: 36.6 C  Resp: 16    Complications: No apparent anesthesia complications

## 2015-06-29 NOTE — Anesthesia Preprocedure Evaluation (Addendum)
Anesthesia Evaluation  Patient identified by MRN, date of birth, ID band Patient awake    Reviewed: Allergy & Precautions, H&P , NPO status , Patient's Chart, lab work & pertinent test results  History of Anesthesia Complications (+) DIFFICULT AIRWAY  Airway Mallampati: III  TM Distance: >3 FB Neck ROM: Full    Dental no notable dental hx. (+) Partial Lower, Partial Upper, Dental Advisory Given   Pulmonary neg pulmonary ROS, former smoker,    Pulmonary exam normal breath sounds clear to auscultation       Cardiovascular hypertension, Pt. on medications  Rhythm:Regular Rate:Normal     Neuro/Psych negative neurological ROS  negative psych ROS   GI/Hepatic Neg liver ROS, Medicated and Controlled,  Endo/Other  Hypothyroidism   Renal/GU Renal InsufficiencyRenal disease  negative genitourinary   Musculoskeletal  (+) Arthritis , Osteoarthritis,    Abdominal   Peds  Hematology negative hematology ROS (+)   Anesthesia Other Findings   Reproductive/Obstetrics negative OB ROS                           Anesthesia Physical Anesthesia Plan  ASA: III  Anesthesia Plan: Spinal   Post-op Pain Management:    Induction: Intravenous  Airway Management Planned: Simple Face Mask  Additional Equipment:   Intra-op Plan:   Post-operative Plan:   Informed Consent: I have reviewed the patients History and Physical, chart, labs and discussed the procedure including the risks, benefits and alternatives for the proposed anesthesia with the patient or authorized representative who has indicated his/her understanding and acceptance.   Dental advisory given  Plan Discussed with: CRNA  Anesthesia Plan Comments:         Anesthesia Quick Evaluation

## 2015-06-29 NOTE — Anesthesia Procedure Notes (Signed)
Spinal Patient location during procedure: OR Start time: 06/29/2015 12:43 PM End time: 06/29/2015 12:45 PM Staffing Resident/CRNA: Chandra Batch A Preanesthetic Checklist Completed: patient identified, site marked, surgical consent, pre-op evaluation, timeout performed, IV checked, risks and benefits discussed and monitors and equipment checked Spinal Block Patient position: sitting Location: L3-4 Injection technique: single-shot Needle Needle type: Quincke  Needle gauge: 22 G Needle length: 9 cm Needle insertion depth: 6 cm Assessment Sensory level: T8

## 2015-06-30 LAB — BASIC METABOLIC PANEL
Anion gap: 4 — ABNORMAL LOW (ref 5–15)
BUN: 21 mg/dL — AB (ref 6–20)
CALCIUM: 8.2 mg/dL — AB (ref 8.9–10.3)
CO2: 29 mmol/L (ref 22–32)
CREATININE: 1.34 mg/dL — AB (ref 0.61–1.24)
Chloride: 103 mmol/L (ref 101–111)
GFR calc non Af Amer: 45 mL/min — ABNORMAL LOW (ref >60)
GFR, EST AFRICAN AMERICAN: 52 mL/min — AB (ref >60)
Glucose, Bld: 132 mg/dL — ABNORMAL HIGH (ref 65–99)
Potassium: 3.8 mmol/L (ref 3.5–5.1)
Sodium: 136 mmol/L (ref 135–145)

## 2015-06-30 LAB — CBC
HEMATOCRIT: 31.6 % — AB (ref 39.0–52.0)
Hemoglobin: 10.4 g/dL — ABNORMAL LOW (ref 13.0–17.0)
MCH: 33.9 pg (ref 26.0–34.0)
MCHC: 32.9 g/dL (ref 30.0–36.0)
MCV: 102.9 fL — ABNORMAL HIGH (ref 78.0–100.0)
Platelets: 195 10*3/uL (ref 150–400)
RBC: 3.07 MIL/uL — ABNORMAL LOW (ref 4.22–5.81)
RDW: 14.2 % (ref 11.5–15.5)
WBC: 14.7 10*3/uL — ABNORMAL HIGH (ref 4.0–10.5)

## 2015-06-30 NOTE — Discharge Instructions (Addendum)

## 2015-06-30 NOTE — NC FL2 (Signed)
[place] LEVEL OF CARE SCREENING TOOL     IDENTIFICATION  Patient Name: [name] [name]. Birthdate: October 26, 1922 Sex: male Admission Date (Current Location): 06/29/2015  [facility] and [place] Number:     Facility and Address:  [facility],  [place] [address] [place]      Provider Number: [id number]  Attending Physician Name and Address:  Gaynelle Arabian, MD  Relative Name and Phone Number:       Current Level of Care: Hospital Recommended Level of Care: [place] Prior Approval Number:    Date Approved/Denied:   PASRR Number:    Discharge Plan: SNF    Current Diagnoses: Patient Active Problem List   Diagnosis Date Noted  . OA (osteoarthritis) of knee 06/29/2015  . Lateral meniscal tear 02/05/2015  . Pneumonia 10/28/2013  . Common cold 10/24/2013  . Hyponatremia 10/20/2013  . Volume depletion 10/20/2013  . Abdominal pain 10/20/2013  . Pancreatitis 10/20/2013  . Dehydration 10/20/2013  . Weakness 10/19/2013  . Hypertension   . 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 Hypothyroidism (acquired)   . Anemia, B12 deficiency   . Colitis   . Arthritis of shoulder region, right 12/15/2011    Orientation ACTIVITIES/SOCIAL BLADDER RESPIRATION    Self, Time, Situation, Place  Active Continent O2 (As needed)  BEHAVIORAL SYMPTOMS/MOOD NEUROLOGICAL BOWEL NUTRITION STATUS   (no behaviors)   Continent Diet  PHYSICIAN VISITS COMMUNICATION OF NEEDS Height & Weight Skin    Verbally 5\' 11"  (180.3 cm) 193 lbs. Surgical wounds          AMBULATORY STATUS RESPIRATION    Assist extensive O2 (As needed)      Personal Care Assistance Level of Assistance  Bathing, Dressing Bathing Assistance: Limited assistance   Dressing Assistance: Limited assistance      Functional Limitations Info  Hearing   Hearing Info: Impaired         SPECIAL CARE FACTORS FREQUENCY  PT (By licensed PT)     PT Frequency: 6-7 x  weekly             Additional Factors Info  Code Status Code Status Info: Full Code             Current Medications (06/30/2015): Current Facility-Administered Medications  Medication Dose Route Frequency Provider Last Rate Last Dose  . 0.9 %  sodium chloride infusion   Intravenous Continuous Arlee Muslim, PA-C 100 mL/hr at 06/30/15 0430    . acetaminophen (TYLENOL) tablet 650 mg  650 mg Oral Q6H PRN Gaynelle Arabian, MD       Or  . acetaminophen (TYLENOL) suppository 650 mg  650 mg Rectal Q6H PRN Gaynelle Arabian, MD      . acetaminophen (TYLENOL) tablet 1,000 mg  1,000 mg Oral 4 times per day Gaynelle Arabian, MD   1,000 mg at 06/30/15 1517  . apixaban (ELIQUIS) tablet 2.5 mg  2.5 mg Oral Q12H Gaynelle Arabian, MD   2.5 mg at 06/30/15 0701  . bisacodyl (DULCOLAX) suppository 10 mg  10 mg Rectal Daily PRN Gaynelle Arabian, MD      . diphenhydrAMINE (BENADRYL) 12.5 MG/5ML elixir 12.5-25 mg  12.5-25 mg Oral Q4H PRN Gaynelle Arabian, MD      . docusate sodium (COLACE) capsule 100 mg  100 mg Oral BID Gaynelle Arabian, MD   100 mg at 06/30/15 0903  . finasteride (PROSCAR) tablet 5 mg  5 mg Oral q morning - 10a Gaynelle Arabian, MD  5 mg at 06/30/15 0903  . HYDROmorphone (DILAUDID) tablet 2-4 mg  2-4 mg Oral Q4H PRN Gaynelle Arabian, MD   2 mg at 06/30/15 0701  . irbesartan (AVAPRO) tablet 150 mg  150 mg Oral Daily Arlee Muslim, PA-C   150 mg at 06/30/15 1000  . levothyroxine (SYNTHROID, LEVOTHROID) tablet 88 mcg  88 mcg Oral QAC breakfast Gaynelle Arabian, MD   88 mcg at 06/30/15 0701  . menthol-cetylpyridinium (CEPACOL) lozenge 3 mg  1 lozenge Oral PRN Gaynelle Arabian, MD       Or  . phenol (CHLORASEPTIC) mouth spray 1 spray  1 spray Mouth/Throat PRN Gaynelle Arabian, MD      . methocarbamol (ROBAXIN) tablet 500 mg  500 mg Oral Q6H PRN Gaynelle Arabian, MD   500 mg at 06/30/15 0430   Or  . methocarbamol (ROBAXIN) 500 mg in dextrose 5 % 50 mL IVPB  500 mg Intravenous Q6H PRN Gaynelle Arabian, MD   500 mg at 06/29/15 1522  .  metoCLOPramide (REGLAN) tablet 5-10 mg  5-10 mg Oral Q8H PRN Gaynelle Arabian, MD       Or  . metoCLOPramide (REGLAN) injection 5-10 mg  5-10 mg Intravenous Q8H PRN Gaynelle Arabian, MD      . morphine 2 MG/ML injection 1 mg  1 mg Intravenous Q2H PRN Gaynelle Arabian, MD      . ondansetron [facility]) tablet 4 mg  4 mg Oral Q6H PRN Gaynelle Arabian, MD       Or  . ondansetron [facility]) injection 4 mg  4 mg Intravenous Q6H PRN Gaynelle Arabian, MD      . pantoprazole (PROTONIX) EC tablet 40 mg  40 mg Oral Daily PRN Gaynelle Arabian, MD      . polyethylene glycol (MIRALAX / GLYCOLAX) packet 17 g  17 g Oral Daily PRN Gaynelle Arabian, MD      . pravastatin (PRAVACHOL) tablet 80 mg  80 mg Oral QPM Gaynelle Arabian, MD   80 mg at 06/29/15 1755  . predniSONE (DELTASONE) tablet 10 mg  10 mg Oral BID WC Arlee Muslim, PA-C   10 mg at 06/30/15 0701   Followed by  . [START ON 07/01/2015] predniSONE (DELTASONE) tablet 7.5 mg  7.5 mg Oral BID WC Arlee Muslim, PA-C       Followed by  . [START ON 07/02/2015] predniSONE (DELTASONE) tablet 7.5 mg  7.5 mg Oral Q breakfast Arlee Muslim, PA-C       Followed by  . [START ON 07/03/2015] predniSONE (DELTASONE) tablet 5 mg  5 mg Oral Q breakfast Arlee Muslim, PA-C      . sodium phosphate (FLEET) 7-19 GM/118ML enema 1 enema  1 enema Rectal Once PRN Gaynelle Arabian, MD      . traMADol Veat[facility] Bourbon) tablet 50-100 mg  50-100 mg Oral Q6H PRN Gaynelle Arabian, MD   100 mg at 06/30/15 0430   Do not use this list as official medication orders. Please verify with discharge summary.  Discharge Medications:   Medication List    ASK your doctor about these medications        acetaminophen 500 MG tablet  Commonly known as:  TYLENOL  Take 1,000 mg by mouth every 6 (six) hours as needed for moderate pain.     B-12 2500 MCG Tabs  Take 1 tablet by mouth every morning.     COD LIVER OIL PO  Take 2 tablets by mouth daily.     Coenzyme Q10 300 MG Caps  Take 1 capsule by mouth every morning.     docusate  sodium 100 MG capsule  Commonly known as:  COLACE  Take 100 mg by mouth at bedtime.     finasteride 5 MG tablet  Commonly known as:  PROSCAR  Take 5 mg by mouth every morning.     Flaxseed Misc  15 mLs by Does not apply route every morning.     HYDROcodone-acetaminophen 5-325 MG tablet  Commonly known as:  NORCO  Take 1-2 tablets by mouth every 6 (six) hours as needed for moderate pain.     INFLUENZA VAC SPLIT HIGH-DOSE IM  Inject into the muscle.     levothyroxine 88 MCG tablet  Commonly known as:  SYNTHROID, LEVOTHROID  Take 88 mcg by mouth daily.     methocarbamol 500 MG tablet  Commonly known as:  ROBAXIN  Take 1 tablet (500 mg total) by mouth 4 (four) times daily. As needed for muscle spasm     multivitamins ther. w/minerals Tabs tablet  Take 1 tablet by mouth at bedtime.     olmesartan 20 MG tablet  Commonly known as:  BENICAR  Take 20 mg by mouth daily.     pantoprazole 40 MG tablet  Commonly known as:  PROTONIX  Take 40 mg by mouth daily as needed (indigestion).     pravastatin 80 MG tablet  Commonly known as:  PRAVACHOL  Take 80 mg by mouth every evening.     predniSONE 5 MG tablet  Commonly known as:  DELTASONE  Take 5 mg in the morning and 2.5 in the evening     PRESCRIPTION MEDICATION  Place 1 drop into both eyes 4 (four) times daily. Serum eye drops mixed with artifical tears. Pt gets [facility]. Dr. Edilia Bo     PREVNAR 13 Susp injection  Generic drug:  pneumococcal 13-valent conjugate vaccine  TO BE ADMINISTERED BY PHARMACIST FOR IMMUNIZATION     SYSTANE OP  Apply 2 drops to eye at bedtime as needed (dry eyes). As needed     TESTOSTERONE IM  Inject 0.15 mLs into the muscle every 7 (seven) days. On Fridays     Vitamin D3 5000 UNITS Tabs  Take 5,000 Units by mouth at bedtime.        Relevant Imaging Results:  Relevant Lab Results:  Recent Labs    Additional Information    Maiah Sinning, Randall An, LCSW

## 2015-06-30 NOTE — Progress Notes (Signed)
OT Cancellation Note  Patient Details Name: [name] [name]. MRN: [id number] DOB: [birth year]   Cancelled Treatment:    Reason Eval/Treat Not Completed: Other (comment) Pt was nauseous and dizzy when OT checked by earlier and pt had just finished with PT. Will try pt next date.  Jules Schick  [phone] 06/30/2015, 1:15 PM

## 2015-06-30 NOTE — Progress Notes (Signed)
   Subjective: 1 Day Post-Op Procedure(s) (LRB): TOTAL KNEE ARTHROPLASTY (Right) Patient reports pain as mild and moderate.   Patient seen in rounds with Dr. Wynelle Link. Patient is well, but has had some minor complaints of pain in the knee, requiring pain medications We will start therapy today.  Plan is to go Skilled nursing facility after hospital stay.  Objective: Vital signs in last 24 hours: Temp:  [97.9 F (36.6 C)-99.1 F (37.3 C)] 98.7 F (37.1 C) (11/01 2100) Pulse Rate:  [61-71] 71 (11/01 2100) Resp:  [14-16] 16 (11/01 2100) BP: (114-159)/(52-64) 159/58 mmHg (11/01 2100) SpO2:  [93 %-100 %] 95 % (11/01 2100)  Intake/Output from previous day:  Intake/Output Summary (Last 24 hours) at 06/30/15 2209 Last data filed at 06/30/15 2111  Gross per 24 hour  Intake 3280.83 ml  Output   1455 ml  Net 1825.83 ml    Intake/Output this shift: Total I/O In: -  Out: 425 [Urine:425]  Labs:  Recent Labs  06/30/15 0435  HGB 10.4*    Recent Labs  06/30/15 0435  WBC 14.7*  RBC 3.07*  HCT 31.6*  PLT 195    Recent Labs  06/30/15 0435  NA 136  K 3.8  CL 103  CO2 29  BUN 21*  CREATININE 1.34*  GLUCOSE 132*  CALCIUM 8.2*   No results for input(s): LABPT, INR in the last 72 hours.  EXAM General - Patient is Alert, Appropriate and Oriented Extremity - Neurovascular intact Sensation intact distally Dressing - dressing C/D/I Motor Function - intact, moving foot and toes well on exam.  Hemovac pulled without difficulty.  Past Medical History  Diagnosis Date  . Hypertension   . Arthritis     Status post left total replacement 8 2011  . 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 Hypothyroidism (acquired) 2000  . Anemia, B12 deficiency 2000  . Hyperlipidemia 2003  . Vitamin D deficiency 2009  . History of renal cell carcinoma 1994      Status post right nephrectomy  . BPH (benign prostatic hyperplasia) 2013  . Glaucoma 2007    Status post left trabeculectomy 2007  . History of colon polyps     Colonoscopy 2001  . Allergic rhinitis     Prior allergy shots 20 years  . Colitis 2014  . Anemia   . Cancer ([place])     renal cell ca and skin cancer   . Nocturia   . History of kidney stones   . Difficult intubation 1994    surgery had to be  stopped due to injury to "throat"  . Difficult intubation 1994    no trouble since.  Required nasotracheal intubation  '02 [facility] / ANESTHESIA RECORD FROM 2013 AND 2016 IN EPIC    Assessment/Plan: 1 Day Post-Op Procedure(s) (LRB): TOTAL KNEE ARTHROPLASTY (Right) Principal Problem:   OA (osteoarthritis) of knee  Estimated body mass index is 26.93 kg/(m^2) as calculated from the following:   Height as of this encounter: 5\' 11"  (1.803 m).   Weight as of this encounter: 87.544 kg (193 lb). Advance diet Up with therapy Plan for discharge tomorrow Discharge to SNF  DVT Prophylaxis - Eliquis Weight-Bearing as tolerated to right leg D/C O2 and Pulse OX and try on Room Air  Arlee Muslim, PA-C Orthopaedic Surgery 06/30/2015, 10:09 PM

## 2015-06-30 NOTE — Evaluation (Signed)
Physical Therapy Evaluation Patient Details Name: [name] [name]. MRN: [id number] DOB: 04-18-1923 Today's Date: 06/30/2015   History of Present Illness  81 yo pt resident of [facility] independent  living s/p R TKA  Clinical Impression  Pt presents today s/p R TKA with deficits listed below (see PT Problem  List). Pt needs continued PT to improve mobility and safety to return to [facility] where he can continue skilled rehab. Pt with D/C plans to SNF in [facility].       Follow Up Recommendations SNF;Supervision for mobility/OOB    Equipment Recommendations  Rolling walker with 5" wheels    Recommendations for Other Services       Precautions / Restrictions Precautions Precautions: Knee;Fall Precaution Comments: didn't use knee immobilizer today d/t I SLR  Required Braces or Orthoses: Knee Immobilizer - Right Knee Immobilizer - Right: On when out of bed or walking;Discontinue once straight leg raise with < 10 degree lag Restrictions Weight Bearing Restrictions: No RLE Weight Bearing: Weight bearing as tolerated      Mobility  Bed Mobility Overal bed mobility: Needs Assistance Bed Mobility: Supine to Sit     Supine to sit: Min assist     General bed mobility comments: steady assist with trunk, min assist with R LE off the bed, VC's for using arms to push off    Transfers Overall transfer level: Needs assistance Equipment used: Rolling walker (2 wheeled) Transfers: Sit to/from Stand Sit to Stand: Mod assist         General transfer comment: steady assist with rising, VC's for hand on the bed and weight shift to LLE. 2 attempt to get up.   Ambulation/Gait Ambulation/Gait assistance: Min assist Ambulation Distance (Feet): 6 Feet Assistive device: Rolling walker (2 wheeled) Gait Pattern/deviations: Step-to pattern;Decreased weight shift to right;Antalgic;Trunk flexed;Decreased step length - left;Decreased stance time - right Gait velocity: decreased     General Gait Details: VC's for walker progression, gait pattern, and weight shift; pt unable to amb long distance d/t nausea and dizziness suspected secondary to medication side effects. BP priot to amb 127/55 (pt supine), post amb 151/49 (pt sitting in recliner).   Stairs            Wheelchair Mobility    Modified Rankin (Stroke Patients Only)       Balance                                             Pertinent Vitals/Pain Pain Assessment: Faces (Pt mainly c/o nausea and lightheadedness, not pain ) Faces Pain Scale: Hurts a little bit Pain Location: R knee Pain Descriptors / Indicators: Sore Pain Intervention(s): Limited activity within patient's tolerance;Monitored during session;Premedicated before session;Repositioned;Ice applied    Home Living Family/patient expects to be discharged to:: Skilled nursing facility Living Arrangements: Alone               Additional Comments: [facility] assisted living     Prior Function Level of Independence: Independent with assistive device(s) (cane)               Hand Dominance        Extremity/Trunk Assessment   Upper Extremity Assessment: Overall WFL for tasks assessed           Lower Extremity Assessment: RLE deficits/detail RLE Deficits / Details: ankle AROM WNL, R knee weakness and decreased ROM (  both active and passive), hip flexion strength 3+/5    Cervical / Trunk Assessment: Normal  Communication   Communication: No difficulties  Cognition Arousal/Alertness: Awake/alert Behavior During Therapy: WFL for tasks assessed/performed Overall Cognitive Status: Within Functional Limits for tasks assessed                      General Comments      Exercises        Assessment/Plan    PT Assessment Patient needs continued PT services  PT Diagnosis Difficulty walking;Abnormality of gait;Acute pain   PT Problem List Decreased strength;Decreased range of motion;Decreased  activity tolerance;Decreased balance;Decreased mobility;Decreased knowledge of precautions;Decreased safety awareness;Decreased knowledge of use of DME;Pain  PT Treatment Interventions DME instruction;Gait training;Stair training;Functional mobility training;Therapeutic activities;Therapeutic exercise;Balance training;Modalities;Patient/family education   PT Goals (Current goals can be found in the Care Plan section) Acute Rehab PT Goals Patient Stated Goal: walk without pain and nausea  PT Goal Formulation: With patient Time For Goal Achievement: 07/07/15 Potential to Achieve Goals: Good    Frequency 7X/week   Barriers to discharge        Co-evaluation               End of Session Equipment Utilized During Treatment: Gait belt Activity Tolerance: Other (comment) (traetment limited d/t nausea and lightheadedness) Patient left: in chair;with call bell/phone within reach;with chair alarm set Nurse Communication: Mobility status;Other (comment) (nausea)         Time: 3748-2707 PT Time Calculation (min) (ACUTE ONLY): 26 min   Charges:   PT Evaluation $Initial PT Evaluation Tier I: 1 Procedure PT Treatments $Gait Training: 8-22 mins   PT G Codes:        Fabio Wah 13-Jul-2015, 10:44 AM

## 2015-06-30 NOTE — Clinical Social Work Note (Signed)
Clinical Social Work Assessment  Patient Details  Name: [name] [name]. MRN: [id number] Date of Birth: 1923-03-02  Date of referral:  06/30/15               Reason for consult:  Facility Placement, Discharge Planning                Permission sought to share information with:  Facility Art therapist granted to share information::  Yes, Verbal Permission Granted  Name::        Agency::     Relationship::     Contact Information:     Housing/Transportation Living arrangements for the past 2 months:  [place] of Information:  Patient, Facility Patient Interpreter Needed:  None Criminal Activity/Legal Involvement Pertinent to Current Situation/Hospitalization:  No - Comment as needed Significant Relationships:  Adult Children Lives with:  Self Do you feel safe going back to the place where you live?  Yes Need for family participation in patient care:  No (Coment)  Care giving concerns:  No concerns reported at this time.   Social Worker assessment / plan: Pt hospitalized on 06/29/15 for pre planned right total knee arthroplasty. CSW met with pt to assist with d/c planning. MD has recommended ST Rehab prior to returning to [place] apt at [facility]. Pt is in agreement with this plan. [facility] contacted and d/c plan confirmed. CSW will continue to follow to assist with d/c planning needs.  Employment status:  Retired Nurse, adult PT Recommendations:  Not assessed at this time Information / Referral to community resources:  [place]  Patient/Family's Response to care:  Pt feels rehab is needed.  Patient/Family's Understanding of and Emotional Response to Diagnosis, Current Treatment, and Prognosis: Pt is aware of his medical status. Pt reports he slept poorly last night due to pain. " I'll try my best with therapy this am but I'm tired. " Support / reassurance   provided.  Emotional Assessment Appearance:  Appears stated age Attitude/Demeanor/Rapport:  Other (cooperative) Affect (typically observed):  Calm, Appropriate Orientation:  Oriented to Self, Oriented to Place, Oriented to  Time, Oriented to Situation Alcohol / Substance use:  Not Applicable Psych involvement (Current and /or in the community):  No (Comment)  Discharge Needs  Concerns to be addressed:  Discharge Planning Concerns Readmission within the last 30 days:  No Current discharge risk:  None Barriers to Discharge:      Luretha Rued, [place] 06/30/2015, 8:30 AM

## 2015-06-30 NOTE — Care Management Note (Signed)
Case Management Note  Patient Details  Name: [name] [name]. MRN: [id number] Date of Birth: 04/15/1923  Subjective/Objective:                  TOTAL KNEE ARTHROPLASTY (Right)  Action/Plan:  Discharge planning Expected Discharge Date:  07/01/15               Expected Discharge Plan:  [place]  In-House Referral:     Discharge planning Services  CM Consult  Post Acute Care Choice:    Choice offered to:     DME Arranged:    DME Agency:     HH Arranged:    [place] Agency:     Status of Service:  Completed, signed off  Medicare Important Message Given:    Date Medicare IM Given:    Medicare IM give by:    Date Additional Medicare IM Given:    Additional Medicare Important Message give by:     If discussed at [place] of Stay Meetings, dates discussed:    Additional Comments: Utilization Review complete.CM notes pt to go to SNF for rehab; CSW aware and arranging.  No other CM needs were communicated. Dellie Catholic, RN 06/30/2015, 11:00 AM

## 2015-06-30 NOTE — Progress Notes (Addendum)
PT Treatment note   06/30/15 1439  PT Visit Information  Last PT Received On 06/30/15  Assistance Needed +2 (for safety)  History of Present Illness 81 yo pt resident of [facility] independent  living s/p R TKA  PT Time Calculation  PT Start Time (ACUTE ONLY) 1409  PT Stop Time (ACUTE ONLY) 1435  PT Time Calculation (min) (ACUTE ONLY) 26 min  Subjective Data  Patient Stated Goal walk without pain and nausea   Precautions  Precautions Knee;Fall  Precaution Comments didn't use knee immobilizer today d/t I SLR   Restrictions  Weight Bearing Restrictions No  RLE Weight Bearing WBAT  Pain Assessment  Pain Assessment 0-10  Pain Score 5  Pain Location R knee  Pain Descriptors / Indicators Sore  Pain Intervention(s) Limited activity within patient's tolerance;Monitored during session;Repositioned  Cognition  Arousal/Alertness Awake/alert  Behavior During Therapy WFL for tasks assessed/performed  Overall Cognitive Status Within Functional Limits for tasks assessed  Bed Mobility  Overal bed mobility Needs Assistance  Bed Mobility Sit to Supine  Sit to supine Min assist  General bed mobility comments Pt in recliner upon arrival. Assisted with LE to return to bed into supine position      Transfers  Overall transfer level Needs assistance  Equipment used Rolling walker (2 wheeled)  Transfers Sit to/from Stand  Sit to Stand Mod assist  General transfer comment 2 people assist for safety with rising from recliner, VC's for weight shift and hand placement.    Ambulation/Gait  Ambulation/Gait assistance Min assist  Ambulation Distance (Feet) 10 Feet  Assistive device Rolling walker (2 wheeled)  Gait Pattern/deviations Step-to pattern;Decreased step length - left;Decreased stance time - right;Decreased weight shift to right;Antalgic  General Gait Details VC's for weight shift and LLE progression forward, multiple VC's for gait pattern and walker progression  Gait velocity decreased    Exercises  Exercises Total Joint  Total Joint Exercises  Ankle Circles/Pumps AROM;Both;15 reps (all exercises performed in recliner prior to amb)  Quad Sets Right;Strengthening;10 reps  Towel Squeeze Strengthening;Both;10 reps  Short Arc Quad AROM;Strengthening;Right;10 reps  Heel Slides Right;5 reps;AROM  Straight Leg Raises AROM;Strengthening;Right;10 reps  PT - End of Session  Equipment Utilized During Treatment Gait belt  Activity Tolerance Patient tolerated treatment well;Other (comment) (no nausea at this time)  Patient left in bed;with call bell/phone within reach;with bed alarm set  Nurse Communication Other (comment) (pt wasn't able to urinate)  PT - Assessment/Plan  PT Plan Current plan remains appropriate  PT Frequency (ACUTE ONLY) 7X/week  Follow Up Recommendations SNF;Supervision for mobility/OOB  PT equipment Rolling walker with 5" wheels  PT Goal Progression  Progress towards PT goals Progressing toward goals  Acute Rehab PT Goals  PT Goal Formulation With patient  Time For Goal Achievement 07/07/15  Potential to Achieve Goals Good   Ana Steed, SPT 06/30/2015 Reviewed and agree with findings. Kenyon Ana, [place]

## 2015-06-30 NOTE — Progress Notes (Signed)
Pt has only voided about 25 cc since catheter removal at 0630 this morning. Bladder scanned patient and saw no more than 222 cc. Encouraged patient to drink more water and turned his fluids back up to 50 cc per original order. Will continue to monitor.

## 2015-07-01 LAB — BASIC METABOLIC PANEL
ANION GAP: 6 (ref 5–15)
BUN: 17 mg/dL (ref 6–20)
CALCIUM: 8.8 mg/dL — AB (ref 8.9–10.3)
CHLORIDE: 104 mmol/L (ref 101–111)
CO2: 28 mmol/L (ref 22–32)
Creatinine, Ser: 1.27 mg/dL — ABNORMAL HIGH (ref 0.61–1.24)
GFR calc non Af Amer: 48 mL/min — ABNORMAL LOW (ref >60)
GFR, EST AFRICAN AMERICAN: 56 mL/min — AB (ref >60)
Glucose, Bld: 135 mg/dL — ABNORMAL HIGH (ref 65–99)
Potassium: 4.4 mmol/L (ref 3.5–5.1)
Sodium: 138 mmol/L (ref 135–145)

## 2015-07-01 LAB — CBC
HEMATOCRIT: 31.4 % — AB (ref 39.0–52.0)
HEMOGLOBIN: 10.4 g/dL — AB (ref 13.0–17.0)
MCH: 33.2 pg (ref 26.0–34.0)
MCHC: 33.1 g/dL (ref 30.0–36.0)
MCV: 100.3 fL — ABNORMAL HIGH (ref 78.0–100.0)
Platelets: 188 10*3/uL (ref 150–400)
RBC: 3.13 MIL/uL — ABNORMAL LOW (ref 4.22–5.81)
RDW: 13.8 % (ref 11.5–15.5)
WBC: 16.4 10*3/uL — AB (ref 4.0–10.5)

## 2015-07-01 MED ORDER — METOCLOPRAMIDE HCL 5 MG PO TABS: 5.0000 mg | ORAL_TABLET | Freq: Three times a day (TID) | ORAL | Status: DC | PRN

## 2015-07-01 MED ORDER — FLEET ENEMA 7-19 GM/118ML RE ENEM: 1.0000 | ENEMA | Freq: Once | RECTAL | Status: DC | PRN

## 2015-07-01 MED ORDER — ONDANSETRON HCL 4 MG PO TABS: 4.0000 mg | ORAL_TABLET | Freq: Four times a day (QID) | ORAL | Status: DC | PRN

## 2015-07-01 MED ORDER — DIPHENHYDRAMINE HCL 12.5 MG/5ML PO ELIX: 12.5000 mg | ORAL_SOLUTION | ORAL | Status: DC | PRN

## 2015-07-01 MED ORDER — HYDROCODONE-ACETAMINOPHEN 5-325 MG PO TABS: 1.0000 | ORAL_TABLET | ORAL | Status: DC | PRN

## 2015-07-01 MED ORDER — PREDNISONE 5 MG PO TABS: 5.0000 mg | ORAL_TABLET | Freq: Every day | ORAL | Status: DC

## 2015-07-01 MED ORDER — APIXABAN 2.5 MG PO TABS: 2.5000 mg | ORAL_TABLET | Freq: Two times a day (BID) | ORAL | Status: DC

## 2015-07-01 MED ORDER — METHOCARBAMOL 500 MG PO TABS: 500.0000 mg | ORAL_TABLET | Freq: Four times a day (QID) | ORAL | Status: DC | PRN

## 2015-07-01 MED ORDER — BISACODYL 10 MG RE SUPP: 10.0000 mg | Freq: Every day | RECTAL | Status: DC | PRN

## 2015-07-01 MED ORDER — TRAMADOL HCL 50 MG PO TABS: 50.0000 mg | ORAL_TABLET | Freq: Four times a day (QID) | ORAL | Status: DC | PRN

## 2015-07-01 NOTE — Progress Notes (Signed)
Subjective: 2 Days Post-Op Procedure(s) (LRB): TOTAL KNEE ARTHROPLASTY (Right) Patient reports pain as mild.   Patient seen in rounds for Dr. Wynelle Link. Patient is well, but has had some minor complaints of pain in the knee, requiring pain medications Patient is ready to go back to [facility] into the rehab unit.  Objective: Vital signs in last 24 hours: Temp:  [98.4 F (36.9 C)-99.1 F (37.3 C)] 98.6 F (37 C) (11/02 0425) Pulse Rate:  [61-72] 72 (11/02 0425) Resp:  [14-16] 14 (11/02 0425) BP: (114-159)/(54-64) 138/62 mmHg (11/02 0425) SpO2:  [93 %-95 %] 95 % (11/02 0425)  Intake/Output from previous day:  Intake/Output Summary (Last 24 hours) at 07/01/15 0753 Last data filed at 07/01/15 0643  Gross per 24 hour  Intake 1917.83 ml  Output   2225 ml  Net -307.17 ml    Labs:  Recent Labs  06/30/15 0435 07/01/15 0410  HGB 10.4* 10.4*    Recent Labs  06/30/15 0435 07/01/15 0410  WBC 14.7* 16.4*  RBC 3.07* 3.13*  HCT 31.6* 31.4*  PLT 195 188    Recent Labs  06/30/15 0435 07/01/15 0410  NA 136 138  K 3.8 4.4  CL 103 104  CO2 29 28  BUN 21* 17  CREATININE 1.34* 1.27*  GLUCOSE 132* 135*  CALCIUM 8.2* 8.8*   No results for input(s): LABPT, INR in the last 72 hours.  EXAM: General - Patient is Alert, Appropriate and Oriented Extremity - Neurovascular intact Sensation intact distally Dorsiflexion/Plantar flexion intact Incision - clean, dry, no drainage Motor Function - intact, moving foot and toes well on exam.   Assessment/Plan: 2 Days Post-Op Procedure(s) (LRB): TOTAL KNEE ARTHROPLASTY (Right) Procedure(s) (LRB): TOTAL KNEE ARTHROPLASTY (Right) Past Medical History  Diagnosis Date  . Hypertension   . Arthritis     Status post left total replacement 8 2011  . 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  Hypothyroidism (acquired) 2000  . Anemia, B12 deficiency 2000  . Hyperlipidemia 2003  . Vitamin D deficiency 2009  . History of renal cell carcinoma 1994    Status post right nephrectomy  . BPH (benign prostatic hyperplasia) 2013  . Glaucoma 2007    Status post left trabeculectomy 2007  . History of colon polyps     Colonoscopy 2001  . Allergic rhinitis     Prior allergy shots 20 years  . Colitis 2014  . Anemia   . Cancer ([place])     renal cell ca and skin cancer   . Nocturia   . History of kidney stones   . Difficult intubation 1994    surgery had to be  stopped due to injury to "throat"  . Difficult intubation 1994    no trouble since.  Required nasotracheal intubation  '02 [facility] / ANESTHESIA RECORD FROM 2013 AND 2016 IN EPIC   Principal Problem:   OA (osteoarthritis) of knee  Estimated body mass index is 26.93 kg/(m^2) as calculated from the following:   Height as of this encounter: 5\' 11"  (1.803 m).   Weight as of this encounter: 87.544 kg (193 lb). Up with therapy Discharge to SNF Diet - Cardiac diet Follow up - in 2 weeks Activity - WBAT Disposition - Skilled nursing facility Condition Upon Discharge - Good D/C Meds - See DC Summary DVT Prophylaxis - Eliquis  Arlee Muslim, PA-C  Orthopaedic Surgery 07/01/2015, 7:53 AM

## 2015-07-01 NOTE — Progress Notes (Cosign Needed)
Physical Therapy Treatment Patient Details Name: [name] [name]. MRN: [id number] DOB: 1923-08-06 Today's Date: 07/01/2015    History of Present Illness 81 yo pt resident of [facility] independent  living s/p R TKA    PT Comments    Pt did very well ambulating today and performing his exercises, however, did have a brief LOB that required assistance to recover, see ambulation details below. Answered all pt's questions and he stated that he is ready for d/c to SNF today.     Follow Up Recommendations  SNF;Supervision for mobility/OOB     Equipment Recommendations  Rolling walker with 5" wheels    Recommendations for Other Services       Precautions / Restrictions Precautions Precautions: Knee;Fall Precaution Comments: didn't use knee immobilizer today d/t I SLR  Restrictions Weight Bearing Restrictions: No RLE Weight Bearing: Weight bearing as tolerated    Mobility  Bed Mobility               General bed mobility comments: up in recliner upon arrival   Transfers Overall transfer level: Needs assistance Equipment used: Rolling walker (2 wheeled) Transfers: Sit to/from Stand Sit to Stand: Min assist         General transfer comment: steady assist with rise and descend, VC's for UE placement   Ambulation/Gait Ambulation/Gait assistance: Min assist Ambulation Distance (Feet): 180 Feet Assistive device: Rolling walker (2 wheeled) Gait Pattern/deviations: Step-to pattern;Antalgic Gait velocity: decreased    General Gait Details: cues for LE advancement and staying within frame of walker. Pt experienced LOB requiring min assist to correct; LOS d/t bcaking up into the chair d/t turning head and looking back.     Stairs            Wheelchair Mobility    Modified Rankin (Stroke Patients Only)       Balance                                    Cognition Arousal/Alertness: Awake/alert Behavior During Therapy: WFL for tasks  assessed/performed Overall Cognitive Status: Within Functional Limits for tasks assessed                      Exercises Total Joint Exercises Ankle Circles/Pumps: AROM;Both;15 reps Quad Sets: Right;Strengthening;10 reps Towel Squeeze: Strengthening;Both;10 reps Short Arc Quad: AROM;Strengthening;Right;10 reps Heel Slides: AAROM;Right;10 reps Hip ABduction/ADduction: AROM;Right;5 reps Straight Leg Raises: AROM;Strengthening;Right;10 reps    General Comments General comments (skin integrity, edema, etc.): R elbow old skin tear that needed to be re-bandaged, nurse was notified      Pertinent Vitals/Pain Pain Assessment: 0-10 Pain Score: 1  Pain Location: R knee Pain Descriptors / Indicators: Moaning;Tightness Pain Intervention(s): Limited activity within patient's tolerance;Monitored during session;Premedicated before session;Repositioned;Ice applied    Home Living                      Prior Function            PT Goals (current goals can now be found in the care plan section) Acute Rehab PT Goals Patient Stated Goal: rehab then back to apartment at [facility] PT Goal Formulation: With patient Time For Goal Achievement: 07/07/15 Potential to Achieve Goals: Good Progress towards PT goals: Progressing toward goals    Frequency  7X/week    PT Plan Current plan remains appropriate    Co-evaluation  End of Session Equipment Utilized During Treatment: Gait belt Activity Tolerance: Patient tolerated treatment well Patient left: in chair;with call bell/phone within reach;with chair alarm set     Time: [phone] PT Time Calculation (min) (ACUTE ONLY): 27 min  Charges:  $Gait Training: 8-22 mins $Therapeutic Exercise: 8-22 mins                    G Codes:      Icelynn Onken Carolyne Fiscal, SPT 04-Jul-2015, 12:38 PM

## 2015-07-01 NOTE — Progress Notes (Signed)
CSW met with pt to assist with d/c planning. Pt is in agreement with d/c to [facility] ( SNF ) today. [facility] provided w/c Lucianne Lei transport. NSG reviewed d/c summary, scripts, avs. Scripts included in d/c packet. D/C summary sent to SNF for review prior to d/c.  Werner Lean LCSW [phone]

## 2015-07-01 NOTE — Evaluation (Signed)
Occupational Therapy Evaluation Patient Details Name: [name] [name]. MRN: [id number] DOB: 02-26-1923 Today's Date: 07/01/2015    History of Present Illness 81 yo pt resident of [place] independent  living s/p R TKA   Clinical Impression   Pt was admitted for the above surgery.  He will benefit from skilled OT in acute as well as follow up at [facility].  Pt was mod I at baseline and goals for standing ADLs and toileting are for supervision level    Follow Up Recommendations  SNF    Equipment Recommendations  3 in 1 bedside comode    Recommendations for Other Services       Precautions / Restrictions Precautions Precautions: Knee;Fall Restrictions Weight Bearing Restrictions: No      Mobility Bed Mobility         Supine to sit: Min assist     General bed mobility comments: assist for RLE  Transfers   Equipment used: Rolling walker (2 wheeled)   Sit to Stand: Min assist;From elevated surface Stand pivot transfers: Min assist       General transfer comment: assist to rise and steady. Cues for UE/LE placement    Balance                                            ADL Overall ADL's : Needs assistance/impaired     Grooming: Set up;Wash/dry hands;Wash/dry face;Sitting   Upper Body Bathing: Set up;Sitting   Lower Body Bathing: Minimal assistance;Sit to/from stand   Upper Body Dressing : Set up;Sitting   Lower Body Dressing: Moderate assistance;Sit to/from stand   Toilet Transfer: Minimal assistance;Stand-pivot (to recliner)   Toileting- Clothing Manipulation and Hygiene: Minimal assistance;Sit to/from stand         General ADL Comments: performed ADL from EOB. Educated on Knee precautions     Vision     Perception     Praxis      Pertinent Vitals/Pain Pain Assessment: 0-10 Pain Score: 4  Pain Location: R knee Pain Descriptors / Indicators: Aching Pain Intervention(s): Limited activity within patient's  tolerance;Monitored during session;Premedicated before session;Repositioned     Hand Dominance     Extremity/Trunk Assessment Upper Extremity Assessment Upper Extremity Assessment: RUE deficits/detail RUE Deficits / Details: s/p reverse TSA:  able to lift 80            Communication Communication Communication: No difficulties   Cognition Arousal/Alertness: Awake/alert Behavior During Therapy: WFL for tasks assessed/performed Overall Cognitive Status: Within Functional Limits for tasks assessed                     General Comments       Exercises       Shoulder Instructions      Home Living Family/patient expects to be discharged to:: Skilled nursing facility Living Arrangements: Alone                               Additional Comments: [facility] assisted living       Prior Functioning/Environment Level of Independence: Independent with assistive device(s)             OT Diagnosis: Generalized weakness;Acute pain   OT Problem List: Pain;Decreased strength;Decreased activity tolerance;Decreased knowledge of use of DME or AE   OT Treatment/Interventions: Self-care/ADL training;DME and/or AE instruction;Patient/family  education    OT Goals(Current goals can be found in the care plan section) Acute Rehab OT Goals Patient Stated Goal: rehab then back to apartment at [place] Goal Formulation: With patient Time For Goal Achievement: 07/08/15 Potential to Achieve Goals: Good ADL Goals Pt Will Perform Grooming: with supervision;standing Pt Will Transfer to Toilet: with supervision;ambulating;bedside commode  OT Frequency: Min 2X/week   Barriers to D/C:            Co-evaluation              End of Session    Activity Tolerance: Patient tolerated treatment well Patient left: in chair;with call bell/phone within reach;with chair alarm set   Time: 1216-2446 OT Time Calculation (min): 23 min Charges:  OT General  Charges $OT Visit: 1 Procedure OT Evaluation $Initial OT Evaluation Tier I: 1 Procedure G-Codes:    Aristide Waggle 2015-07-28, 9:00 AM Lesle Chris, OTR/L [phone] 07-28-2015

## 2015-07-01 NOTE — Discharge Summary (Signed)
Physician Discharge Summary   Patient ID: [name] [name]. MRN: [id number] DOB/AGE: 1922-09-01 81 y.o.  Admit date: 06/29/2015 Discharge date: 07-01-2015  Primary Diagnosis:  Osteoarthritis Right knee(s)  Admission Diagnoses:  Past Medical History  Diagnosis Date  . Hypertension   . Arthritis     Status post left total replacement 8 2011  . 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 Hypothyroidism (acquired) 2000  . Anemia, B12 deficiency 2000  . Hyperlipidemia 2003  . Vitamin D deficiency 2009  . History of renal cell carcinoma 1994    Status post right nephrectomy  . BPH (benign prostatic hyperplasia) 2013  . Glaucoma 2007    Status post left trabeculectomy 2007  . History of colon polyps     Colonoscopy 2001  . Allergic rhinitis     Prior allergy shots 20 years  . Colitis 2014  . Anemia   . Cancer ([place])     renal cell ca and skin cancer   . Nocturia   . History of kidney stones   . Difficult intubation 1994    surgery had to be  stopped due to injury to "throat"  . Difficult intubation 1994    no trouble since.  Required nasotracheal intubation  '02 [facility] / ANESTHESIA RECORD FROM 2013 AND 2016 IN EPIC   Discharge Diagnoses:   Principal Problem:   OA (osteoarthritis) of knee  Estimated body mass index is 26.93 kg/(m^2) as calculated from the following:   Height as of this encounter: '5\' 11"'  (1.803 m).   Weight as of this encounter: 87.544 kg (193 lb).  Procedure:  Procedure(s) (LRB): TOTAL KNEE ARTHROPLASTY (Right)   Consults: None  HPI: [name] [name]. is a 81 y.o. year old male with end stage OA of his right knee with progressively worsening pain and dysfunction. He has constant pain, with activity and at rest and significant functional deficits with difficulties even with ADLs. He has had extensive non-op management  including analgesics, injections of cortisone and viscosupplements, and home exercise program, but remains in significant pain with significant dysfunction. Radiographs show bone on bone lateral compartment. He presents now for rightt Total Knee Arthroplasty.   Laboratory Data: Admission on 06/29/2015  Component Date Value Ref Range Status  . WBC 06/30/2015 14.7* 4.0 - 10.5 K/uL Final  . RBC 06/30/2015 3.07* 4.22 - 5.81 MIL/uL Final  . Hemoglobin 06/30/2015 10.4* 13.0 - 17.0 g/dL Final  . HCT 06/30/2015 31.6* 39.0 - 52.0 % Final  . MCV 06/30/2015 102.9* 78.0 - 100.0 fL Final  . MCH 06/30/2015 33.9  26.0 - 34.0 pg Final  . MCHC 06/30/2015 32.9  30.0 - 36.0 g/dL Final  . RDW 06/30/2015 14.2  11.5 - 15.5 % Final  . Platelets 06/30/2015 195  150 - 400 K/uL Final  . Sodium 06/30/2015 136  135 - 145 mmol/L Final  . Potassium 06/30/2015 3.8  3.5 - 5.1 mmol/L Final  . Chloride 06/30/2015 103  101 - 111 mmol/L Final  . CO2 06/30/2015 29  22 - 32 mmol/L Final  . Glucose, Bld 06/30/2015 132* 65 - 99 mg/dL Final  . BUN 06/30/2015 21* 6 - 20 mg/dL Final  . Creatinine, Ser 06/30/2015 1.34* 0.61 - 1.24 mg/dL Final  . Calcium 06/30/2015 8.2* 8.9 - 10.3 mg/dL Final  . GFR calc non Af Amer 06/30/2015 45* >  60 mL/min Final  . GFR calc Af Amer 06/30/2015 52* >60 mL/min Final   Comment: (NOTE) The eGFR has been calculated using the CKD EPI equation. This calculation has not been validated in all clinical situations. eGFR's persistently <60 mL/min signify possible Chronic Kidney Disease.   . Anion gap 06/30/2015 4* 5 - 15 Final  . WBC 07/01/2015 16.4* 4.0 - 10.5 K/uL Final  . RBC 07/01/2015 3.13* 4.22 - 5.81 MIL/uL Final  . Hemoglobin 07/01/2015 10.4* 13.0 - 17.0 g/dL Final  . HCT 07/01/2015 31.4* 39.0 - 52.0 % Final  . MCV 07/01/2015 100.3* 78.0 - 100.0 fL Final  . MCH 07/01/2015 33.2  26.0 - 34.0 pg Final  . MCHC 07/01/2015 33.1  30.0 - 36.0 g/dL Final  . RDW 07/01/2015 13.8  11.5 - 15.5 % Final    . Platelets 07/01/2015 188  150 - 400 K/uL Final  . Sodium 07/01/2015 138  135 - 145 mmol/L Final  . Potassium 07/01/2015 4.4  3.5 - 5.1 mmol/L Final  . Chloride 07/01/2015 104  101 - 111 mmol/L Final  . CO2 07/01/2015 28  22 - 32 mmol/L Final  . Glucose, Bld 07/01/2015 135* 65 - 99 mg/dL Final  . BUN 07/01/2015 17  6 - 20 mg/dL Final  . Creatinine, Ser 07/01/2015 1.27* 0.61 - 1.24 mg/dL Final  . Calcium 07/01/2015 8.8* 8.9 - 10.3 mg/dL Final  . GFR calc non Af Amer 07/01/2015 48* >60 mL/min Final  . GFR calc Af Amer 07/01/2015 56* >60 mL/min Final   Comment: (NOTE) The eGFR has been calculated using the CKD EPI equation. This calculation has not been validated in all clinical situations. eGFR's persistently <60 mL/min signify possible Chronic Kidney Disease.   Georgiann Hahn gap 07/01/2015 6  5 - 15 Final  Hospital Outpatient Visit on 06/24/2015  Component Date Value Ref Range Status  . MRSA, PCR 06/24/2015 NEGATIVE  NEGATIVE Final  . Staphylococcus aureus 06/24/2015 NEGATIVE  NEGATIVE Final   Comment:        The Xpert SA Assay (FDA approved for NASAL specimens in patients over 70 years of age), is one component of a comprehensive surveillance program.  Test performance has been validated by [facility] for patients greater than or equal to 54 year old. It is not intended to diagnose infection nor to guide or monitor treatment.   Marland Kitchen aPTT 06/24/2015 29  24 - 37 seconds Final  . WBC 06/24/2015 13.4* 4.0 - 10.5 K/uL Final  . RBC 06/24/2015 4.17* 4.22 - 5.81 MIL/uL Final  . Hemoglobin 06/24/2015 14.1  13.0 - 17.0 g/dL Final  . HCT 06/24/2015 42.9  39.0 - 52.0 % Final  . MCV 06/24/2015 102.9* 78.0 - 100.0 fL Final  . MCH 06/24/2015 33.8  26.0 - 34.0 pg Final  . MCHC 06/24/2015 32.9  30.0 - 36.0 g/dL Final  . RDW 06/24/2015 14.4  11.5 - 15.5 % Final  . Platelets 06/24/2015 269  150 - 400 K/uL Final  . Sodium 06/24/2015 139  135 - 145 mmol/L Final  . Potassium 06/24/2015 5.1  3.5 -  5.1 mmol/L Final  . Chloride 06/24/2015 103  101 - 111 mmol/L Final  . CO2 06/24/2015 30  22 - 32 mmol/L Final  . Glucose, Bld 06/24/2015 108* 65 - 99 mg/dL Final  . BUN 06/24/2015 25* 6 - 20 mg/dL Final  . Creatinine, Ser 06/24/2015 1.52* 0.61 - 1.24 mg/dL Final  . Calcium 06/24/2015 9.9  8.9 - 10.3  mg/dL Final  . Total Protein 06/24/2015 6.8  6.5 - 8.1 g/dL Final  . Albumin 06/24/2015 4.0  3.5 - 5.0 g/dL Final  . AST 06/24/2015 24  15 - 41 U/L Final  . ALT 06/24/2015 20  17 - 63 U/L Final  . Alkaline Phosphatase 06/24/2015 50  38 - 126 U/L Final  . Total Bilirubin 06/24/2015 0.7  0.3 - 1.2 mg/dL Final  . GFR calc non Af Amer 06/24/2015 39* >60 mL/min Final  . GFR calc Af Amer 06/24/2015 45* >60 mL/min Final   Comment: (NOTE) The eGFR has been calculated using the CKD EPI equation. This calculation has not been validated in all clinical situations. eGFR's persistently <60 mL/min signify possible Chronic Kidney Disease.   . Anion gap 06/24/2015 6  5 - 15 Final  . Prothrombin Time 06/24/2015 13.8  11.6 - 15.2 seconds Final  . INR 06/24/2015 1.04  0.00 - 1.49 Final  . ABO/RH(D) 06/24/2015 O POS   Final  . Antibody Screen 06/24/2015 NEG   Final  . Sample Expiration 06/24/2015 07/02/2015   Final  . Extend sample reason 06/24/2015 NO TRANSFUSIONS OR PREGNANCY IN THE PAST 3 MONTHS   Final  . Color, Urine 06/24/2015 YELLOW  YELLOW Final  . APPearance 06/24/2015 CLEAR  CLEAR Final  . Specific Gravity, Urine 06/24/2015 1.010  1.005 - 1.030 Final  . pH 06/24/2015 7.0  5.0 - 8.0 Final  . Glucose, UA 06/24/2015 NEGATIVE  NEGATIVE mg/dL Final  . Hgb urine dipstick 06/24/2015 NEGATIVE  NEGATIVE Final  . Bilirubin Urine 06/24/2015 NEGATIVE  NEGATIVE Final  . Ketones, ur 06/24/2015 NEGATIVE  NEGATIVE mg/dL Final  . Protein, ur 06/24/2015 NEGATIVE  NEGATIVE mg/dL Final  . Urobilinogen, UA 06/24/2015 0.2  0.0 - 1.0 mg/dL Final  . Nitrite 06/24/2015 NEGATIVE  NEGATIVE Final  . Leukocytes, UA  06/24/2015 NEGATIVE  NEGATIVE Final   MICROSCOPIC NOT DONE ON URINES WITH NEGATIVE PROTEIN, BLOOD, LEUKOCYTES, NITRITE, OR GLUCOSE <1000 mg/dL.     X-Rays:No results found.  EKG: Orders placed or performed during the hospital encounter of 02/04/15  . EKG 12-Lead  . EKG 12-Lead     Hospital Course: Taejon Irani. is a 81 y.o. who was admitted to [facility]. They were brought to the operating room on 06/29/2015 and underwent Procedure(s): TOTAL KNEE ARTHROPLASTY.  Patient tolerated the procedure well and was later transferred to the recovery room and then to the orthopaedic floor for postoperative care.  They were given PO and IV analgesics for pain control following their surgery.  They were given 24 hours of postoperative antibiotics of  Anti-infectives    Start     Dose/Rate Route Frequency Ordered Stop   06/29/15 1900  ceFAZolin (ANCEF) IVPB 2 g/50 mL premix     2 g 100 mL/hr over 30 Minutes Intravenous Every 6 hours 06/29/15 1508 06/30/15 0040   06/29/15 0938  ceFAZolin (ANCEF) IVPB 2 g/50 mL premix     2 g 100 mL/hr over 30 Minutes Intravenous On call to O.R. 06/29/15 6962 06/29/15 1247     and started on DVT prophylaxis in the form of Eliquis.   PT and OT were ordered for total joint protocol.  Discharge planning consulted to help with postop disposition and equipment needs. Social worker consulted to assist with placement into the skilled unit at [facility] where he was a resident.  Patient had a decent night on the evening of surgery but did have some  pain.  They started to get up OOB with therapy on day one and did well.  Hemovac drain was pulled without difficulty. He had some increase in pain that night due to how well he did with therapy on day one. Continued to work with therapy into day two.  Dressing was changed on day two and the incision was healing well.  Patient was seen in rounds on day two by Dr. Wynelle Link and was ready to go to the SNF unit.  Discharge to  SNF Diet - Cardiac diet Follow up - in 2 weeks Activity - WBAT Disposition - Skilled nursing facility Condition Upon Discharge - Good D/C Meds - See DC Summary DVT Prophylaxis - Eliquis  Discharge Instructions    Call MD / Call 911    Complete by:  As directed   If you experience chest pain or shortness of breath, CALL 911 and be transported to the hospital emergency room.  If you develope a fever above 101 F, pus (white drainage) or increased drainage or redness at the wound, or calf pain, call your surgeon's office.     Change dressing    Complete by:  As directed   Change dressing daily with sterile 4 x 4 inch gauze dressing and apply TED hose. Do not submerge the incision under water.     Constipation Prevention    Complete by:  As directed   Drink plenty of fluids.  Prune juice may be helpful.  You may use a stool softener, such as Colace (over the counter) 100 mg twice a day.  Use MiraLax (over the counter) for constipation as needed.     Diet - low sodium heart healthy    Complete by:  As directed      Discharge instructions    Complete by:  As directed   Pick up stool softner and laxative for home use following surgery while on pain medications. Do not submerge incision under water. Please use good hand washing techniques while changing dressing each day. May shower starting three days after surgery. Please use a clean towel to pat the incision dry following showers. Continue to use ice for pain and swelling after surgery. Do not use any lotions or creams on the incision until instructed by your surgeon.  Take Eliquis twice a day for three weeks, then discontinue Eliquis. Once the patient has completed the blood thinner regimen, then take a Baby 81 mg Aspirin daily for three more weeks.  Postoperative Constipation Protocol  Constipation - defined medically as fewer than three stools per week and severe constipation as less than one stool per week.  One of the most common  issues patients have following surgery is constipation.  Even if you have a regular bowel pattern at home, your normal regimen is likely to be disrupted due to multiple reasons following surgery.  Combination of anesthesia, postoperative narcotics, change in appetite and fluid intake all can affect your bowels.  In order to avoid complications following surgery, here are some recommendations in order to help you during your recovery period.  Colace (docusate) - Pick up an over-the-counter form of Colace or another stool softener and take twice a day as long as you are requiring postoperative pain medications.  Take with a full glass of water daily.  If you experience loose stools or diarrhea, hold the colace until you stool forms back up.  If your symptoms do not get better within 1 week or if they get worse, check  with your doctor.  Dulcolax (bisacodyl) - Pick up over-the-counter and take as directed by the product packaging as needed to assist with the movement of your bowels.  Take with a full glass of water.  Use this product as needed if not relieved by Colace only.   MiraLax (polyethylene glycol) - Pick up over-the-counter to have on hand.  MiraLax is a solution that will increase the amount of water in your bowels to assist with bowel movements.  Take as directed and can mix with a glass of water, juice, soda, coffee, or tea.  Take if you go more than two days without a movement. Do not use MiraLax more than once per day. Call your doctor if you are still constipated or irregular after using this medication for 7 days in a row.  If you continue to have problems with postoperative constipation, please contact the office for further assistance and recommendations.  If you experience "the worst abdominal pain ever" or develop nausea or vomiting, please contact the office immediatly for further recommendations for treatment.  When discharged from the skilled rehab facility, please have the facility set  up the patient's [place] prior to being released.  Please make sure this gets set up prior to release in order to avoid any lapse of therapy following the rehab stay.  Also provide the patient with their medications at time of release from the facility to include their pain medication, the muscle relaxants, and their blood thinner medication.  If the patient is still at the rehab facility at time of follow up appointment, please also assist the patient in arranging follow up appointment in our office and any transportation needs. ICE to the affected knee or hip every three hours for 30 minutes at a time and then as needed for pain and swelling.     Do not put a pillow under the knee. Place it under the heel.    Complete by:  As directed      Do not sit on low chairs, stoools or toilet seats, as it may be difficult to get up from low surfaces    Complete by:  As directed      Driving restrictions    Complete by:  As directed   No driving until released by the physician.     Increase activity slowly as tolerated    Complete by:  As directed      Lifting restrictions    Complete by:  As directed   No lifting until released by the physician.     Patient may shower    Complete by:  As directed   You may shower without a dressing once there is no drainage.  Do not wash over the wound.  If drainage remains, do not shower until drainage stops.     TED hose    Complete by:  As directed   Use stockings (TED hose) for 3 weeks on both leg(s).  You may remove them at night for sleeping.     Weight bearing as tolerated    Complete by:  As directed   Laterality:  right  Extremity:  Lower            Medication List    STOP taking these medications        B-12 2500 MCG Tabs     COD LIVER OIL PO     Coenzyme Q10 300 MG Caps     Flaxseed Misc  INFLUENZA VAC SPLIT HIGH-DOSE IM     multivitamins ther. w/minerals Tabs tablet     PREVNAR 13 Susp injection  Generic  drug:  pneumococcal 13-valent conjugate vaccine     TESTOSTERONE IM     Vitamin D3 5000 UNITS Tabs      TAKE these medications        acetaminophen 500 MG tablet  Commonly known as:  TYLENOL  Take 1,000 mg by mouth every 6 (six) hours as needed for moderate pain.     apixaban 2.5 MG Tabs tablet  Commonly known as:  ELIQUIS  Take 1 tablet (2.5 mg total) by mouth every 12 (twelve) hours. Take Eliquis twice a day for a total of three weeks, then discontinue Eliquis. Once the patient has completed the blood thinner regimen, then take a Baby 81 mg Aspirin daily for three more weeks.     bisacodyl 10 MG suppository  Commonly known as:  DULCOLAX  Place 1 suppository (10 mg total) rectally daily as needed for moderate constipation.     diphenhydrAMINE 12.5 MG/5ML elixir  Commonly known as:  BENADRYL  Take 5-10 mLs (12.5-25 mg total) by mouth every 4 (four) hours as needed for itching.     docusate sodium 100 MG capsule  Commonly known as:  COLACE  Take 100 mg by mouth at bedtime.     finasteride 5 MG tablet  Commonly known as:  PROSCAR  Take 5 mg by mouth every morning.     HYDROcodone-acetaminophen 5-325 MG tablet  Commonly known as:  NORCO  Take 1-2 tablets by mouth every 4 (four) hours as needed for moderate pain.     levothyroxine 88 MCG tablet  Commonly known as:  SYNTHROID, LEVOTHROID  Take 88 mcg by mouth daily.     methocarbamol 500 MG tablet  Commonly known as:  ROBAXIN  Take 1 tablet (500 mg total) by mouth every 6 (six) hours as needed for muscle spasms.     metoCLOPramide 5 MG tablet  Commonly known as:  REGLAN  Take 1 tablet (5 mg total) by mouth every 8 (eight) hours as needed for nausea (if ondansetron (ZOFRAN) ineffective.).     olmesartan 20 MG tablet  Commonly known as:  BENICAR  Take 20 mg by mouth daily.     ondansetron 4 MG tablet  Commonly known as:  ZOFRAN  Take 1 tablet (4 mg total) by mouth every 6 (six) hours as needed for nausea.      pantoprazole 40 MG tablet  Commonly known as:  PROTONIX  Take 40 mg by mouth daily as needed (indigestion).     pravastatin 80 MG tablet  Commonly known as:  PRAVACHOL  Take 80 mg by mouth every evening.     predniSONE 5 MG tablet  Commonly known as:  DELTASONE  Take 1 tablet (5 mg total) by mouth daily with breakfast. Wednesday 07/01/2015 - Take 7.5 mg twice a day for one day. Thursday 07/02/2015 - Take 5 mg twice a day for one day. Friday 07/03/2015 - Resume Previous Home Dosing - Take 5 mg in the morning and 2.5 in the evening     PRESCRIPTION MEDICATION  Place 1 drop into both eyes 4 (four) times daily. Serum eye drops mixed with artifical tears. Pt gets mixed at [facility]. Dr. Edilia Bo     sodium phosphate 7-19 GM/118ML Enem  Place 133 mLs (1 enema total) rectally once as needed for severe constipation.     SYSTANE OP  Apply 2 drops to eye at bedtime as needed (dry eyes). As needed     traMADol 50 MG tablet  Commonly known as:  ULTRAM  Take 1-2 tablets (50-100 mg total) by mouth every 6 (six) hours as needed (mild pain).           Follow-up Information    Follow up with [name], MD. Schedule an appointment as soon as possible for a visit on 07/14/2015.   Specialty:  Orthopedic Surgery   Why:  Call office ASAP at [phone] to setup appointment on Tuesday 07/14/2015 with Dr. Wynelle Link.   Contact information:   [address] [place] [address] [phone]       Signed: Arlee Muslim, PA-C Orthopaedic Surgery 07/01/2015, 8:10 AM

## 2015-07-07 ENCOUNTER — Encounter: Payer: Self-pay | Admitting: Internal Medicine

## 2015-07-07 ENCOUNTER — Non-Acute Institutional Stay (SKILLED_NURSING_FACILITY): Payer: PPO | Admitting: Internal Medicine

## 2015-07-07 DIAGNOSIS — E236 Other disorders of pituitary gland: Secondary | ICD-10-CM

## 2015-07-07 DIAGNOSIS — M1711 Unilateral primary osteoarthritis, right knee: Secondary | ICD-10-CM | POA: Diagnosis not present

## 2015-07-07 DIAGNOSIS — Z96651 Presence of right artificial knee joint: Secondary | ICD-10-CM

## 2015-07-07 DIAGNOSIS — Z789 Other specified health status: Secondary | ICD-10-CM | POA: Diagnosis not present

## 2015-07-07 DIAGNOSIS — I10 Essential (primary) hypertension: Secondary | ICD-10-CM | POA: Diagnosis not present

## 2015-07-07 DIAGNOSIS — E893 Postprocedural hypopituitarism: Secondary | ICD-10-CM

## 2015-07-07 DIAGNOSIS — E039 Hypothyroidism, unspecified: Secondary | ICD-10-CM

## 2015-07-07 DIAGNOSIS — E291 Testicular hypofunction: Secondary | ICD-10-CM

## 2015-07-09 ENCOUNTER — Encounter: Payer: Self-pay | Admitting: Adult Health

## 2015-07-09 ENCOUNTER — Non-Acute Institutional Stay (SKILLED_NURSING_FACILITY): Payer: PPO | Admitting: Adult Health

## 2015-07-09 DIAGNOSIS — I10 Essential (primary) hypertension: Secondary | ICD-10-CM

## 2015-07-09 DIAGNOSIS — R300 Dysuria: Secondary | ICD-10-CM

## 2015-07-09 DIAGNOSIS — R42 Dizziness and giddiness: Secondary | ICD-10-CM | POA: Diagnosis not present

## 2015-07-09 DIAGNOSIS — E236 Other disorders of pituitary gland: Secondary | ICD-10-CM

## 2015-07-09 DIAGNOSIS — E893 Postprocedural hypopituitarism: Secondary | ICD-10-CM

## 2015-07-09 NOTE — Progress Notes (Signed)
Patient ID: [name] [name]., male   DOB: 1923/04/05, 81 y.o.   MRN: [id number]     Nursing Home Location:  [facility]   Code Status: DNR  Patient Care Team: Lorne Skeens, MD as PCP - General (Endocrinology) [facility] of Service: SNF [phone])  Chief Complaint  Patient presents with  . Acute Visit    dizzy, nauseous, low BP, dysuria    HPI:  81 y.o. male residing at [facility], rehab section. I was asked to see him due to symptoms of dizziness, low bp, nausea present for  two days mostly in the morning. He was admitted on 11/2 to rehab for therapy after a right TKA.  During his hospital stay his WBC count remained elevated at 16.2.  Presumably this was felt to be due to his chronic prednisone use. During his post op period his prednisone was increased to 7.5 mg BID and then tapered back to his original dose of 5 mg in the am and 2.5 mg in the afternoon. He has a hx of hypopituitarism, s/p pituitary tumor removal in 2000.   He denied cough, SOB, CP or diarrhea. He endorsed dysuria for 3-4 days. He has a hx of BPH with a weak stream and frequency. He has had an intermittent low grade temp 99-100.5 since admission.      Review of Systems:  Review of Systems  Constitutional: Positive for fever, activity change, appetite change and fatigue. Negative for chills and diaphoresis.  HENT: Negative for congestion, postnasal drip and rhinorrhea.   Respiratory: Negative for cough, shortness of breath and wheezing.   Cardiovascular: Positive for leg swelling. Negative for chest pain and palpitations.  Gastrointestinal: Positive for nausea. Negative for abdominal pain, diarrhea, constipation, blood in stool and abdominal distention.  Genitourinary: Positive for dysuria and frequency. Negative for hematuria, flank pain, discharge, difficulty urinating and penile pain.  Musculoskeletal: Positive for arthralgias and gait  problem. Negative for back pain.  Neurological: Positive for dizziness and light-headedness. Negative for seizures, syncope, facial asymmetry, speech difficulty, numbness and headaches.  Psychiatric/Behavioral: Negative for behavioral problems, confusion and agitation.    Medications: Patient's Medications  New Prescriptions   No medications on file  Previous Medications   ACETAMINOPHEN (TYLENOL) 500 MG TABLET    Take 1,000 mg by mouth every 6 (six) hours as needed for moderate pain.   APIXABAN (ELIQUIS) 2.5 MG TABS TABLET    Take 1 tablet (2.5 mg total) by mouth every 12 (twelve) hours. Take Eliquis twice a day for a total of three weeks, then discontinue Eliquis. Once the patient has completed the blood thinner regimen, then take a Baby 81 mg Aspirin daily for three more weeks.   BISACODYL (DULCOLAX) 10 MG SUPPOSITORY    Place 1 suppository (10 mg total) rectally daily as needed for moderate constipation.   DIPHENHYDRAMINE (BENADRYL) 12.5 MG/5ML ELIXIR    Take 5-10 mLs (12.5-25 mg total) by mouth every 4 (four) hours as needed for itching.   DOCUSATE SODIUM (COLACE) 100 MG CAPSULE    Take 100 mg by mouth at bedtime.    FINASTERIDE (PROSCAR) 5 MG TABLET    Take 5 mg by mouth every morning.    HYDROCODONE-ACETAMINOPHEN (NORCO) 5-325 MG TABLET    Take 1-2 tablets by mouth every 4 (four) hours as needed for moderate pain.   LEVOTHYROXINE (SYNTHROID, LEVOTHROID) 88 MCG TABLET    Take 88 mcg by mouth daily.   METHOCARBAMOL (  ROBAXIN) 500 MG TABLET    Take 1 tablet (500 mg total) by mouth every 6 (six) hours as needed for muscle spasms.   METOCLOPRAMIDE (REGLAN) 5 MG TABLET    Take 1 tablet (5 mg total) by mouth every 8 (eight) hours as needed for nausea (if ondansetron (ZOFRAN) ineffective.).   OLMESARTAN (BENICAR) 20 MG TABLET    Take 20 mg by mouth daily.   ONDANSETRON (ZOFRAN) 4 MG TABLET    Take 1 tablet (4 mg total) by mouth every 6 (six) hours as needed for nausea.   PANTOPRAZOLE (PROTONIX)  40 MG TABLET    Take 40 mg by mouth daily as needed (indigestion).   POLYETHYL GLYCOL-PROPYL GLYCOL (SYSTANE OP)    Apply 2 drops to eye at bedtime as needed (dry eyes). As needed   PRAVASTATIN (PRAVACHOL) 80 MG TABLET    Take 80 mg by mouth every evening.    PREDNISONE (DELTASONE) 5 MG TABLET    Take 1 tablet (5 mg total) by mouth daily with breakfast. Wednesday 07/01/2015 - Take 7.5 mg twice a day for one day. Thursday 07/02/2015 - Take 5 mg twice a day for one day. Friday 07/03/2015 - Resume Previous Home Dosing - Take 5 mg in the morning and 2.5 in the evening   SODIUM PHOSPHATE (FLEET) 7-19 GM/118ML ENEM    Place 133 mLs (1 enema total) rectally once as needed for severe constipation.   TRAMADOL (ULTRAM) 50 MG TABLET    Take 1-2 tablets (50-100 mg total) by mouth every 6 (six) hours as needed (mild pain).  Modified Medications   No medications on file  Discontinued Medications   No medications on file     Physical Exam:  Filed Vitals:   07/09/15 1416  BP: 105/55  Pulse: 80  Temp: 99 F (37.2 C)  Resp: 16  SpO2: 98%    Physical Exam  Constitutional: He is oriented to person, place, and time. No distress.  HENT:  Head: Normocephalic and atraumatic.  Neck: Normal range of motion. Neck supple. No JVD present. No thyromegaly present.  Cardiovascular: Normal rate and regular rhythm.   No murmur heard. Distant HS. Edema to RLE  Pulmonary/Chest: Effort normal and breath sounds normal. No respiratory distress. He has no wheezes.  Abdominal: Soft. Bowel sounds are normal. He exhibits no distension. There is no tenderness.  No CVA tenderness, no s/p tenderness  Musculoskeletal:  Right knee brace in place  Neurological: He is alert and oriented to person, place, and time. No cranial nerve deficit.  Skin: Skin is warm and dry. He is not diaphoretic.  Psychiatric: Affect normal.    Wt Readings from Last 3 Encounters:  07/07/15 193 lb (87.544 kg)  06/29/15 193 lb (87.544 kg)    06/24/15 193 lb (87.544 kg)     Labs reviewed/Significant Diagnostic Results:  Basic Metabolic Panel:  Recent Labs  06/24/15 1155 06/30/15 0435 07/01/15 0410  NA 139 136 138  K 5.1 3.8 4.4  CL 103 103 104  CO2 30 29 28   GLUCOSE 108* 132* 135*  BUN 25* 21* 17  CREATININE 1.52* 1.34* 1.27*  CALCIUM 9.9 8.2* 8.8*   Liver Function Tests:  Recent Labs  06/24/15 1155  AST 24  ALT 20  ALKPHOS 50  BILITOT 0.7  PROT 6.8  ALBUMIN 4.0   No results for input(s): LIPASE, AMYLASE in the last 8760 hours. No results for input(s): AMMONIA in the last 8760 hours. CBC:  Recent Labs  06/24/15  1155 06/30/15 0435 07/01/15 0410  WBC 13.4* 14.7* 16.4*  HGB 14.1 10.4* 10.4*  HCT 42.9 31.6* 31.4*  MCV 102.9* 102.9* 100.3*  PLT 269 195 188   CBG: No results for input(s): GLUCAP in the last 8760 hours. TSH: No results for input(s): TSH in the last 8760 hours. A1C: No results found for: HGBA1C Lipid Panel: No results for input(s): CHOL, HDL, LDLCALC, TRIG, CHOLHDL, LDLDIRECT in the last 8760 hours.     Assessment/Plan  1. Dizziness -associated with nausea, and low bp ?adrenal insuff -check CMP, CBC with diff, and am cortisol in the morning -would recommend increasing prednisone if cortisol low   2. 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 Elyse Hsu, reviewed his most recent notes -continue current meds  3. Essential hypertension -hold Benicar for BP < or =100  4. Dysuria -UA C and S -encourage fluid     Cindi Carbon, ANP [facility] [phone]

## 2015-07-11 LAB — BASIC METABOLIC PANEL
BUN: 28 mg/dL — AB (ref 4–21)
Creatinine: 1.3 mg/dL (ref 0.6–1.3)
Glucose: 134 mg/dL
Potassium: 5 mmol/L (ref 3.4–5.3)
Sodium: 134 mmol/L — AB (ref 137–147)

## 2015-07-11 LAB — CBC AND DIFFERENTIAL
HEMATOCRIT: 30 % — AB (ref 41–53)
HEMOGLOBIN: 9.7 g/dL — AB (ref 13.5–17.5)
PLATELETS: 377 10*3/uL (ref 150–399)
WBC: 9.7 10*3/mL

## 2015-07-11 NOTE — Progress Notes (Signed)
Patient ID: [name] Dressel., male   DOB: [birth year], 81 y.o.   MRN: [id number]  Provider:  Rexene Edison. Mariea Clonts, D.O., C.M.D. Location:  [facility] PCP: [name], MD  Code Status: DNR Goals of Care: Advanced Directive information Does patient have an advance directive?: Yes, Type of Advance Directive: Aneta;Living will;Out of facility DNR (pink MOST or yellow form), Pre-existing out of facility DNR order (yellow form or pink MOST form): Yellow form placed in chart (order not valid for inpatient use);Pink MOST form placed in chart (order not valid for inpatient use)   Chief Complaint  Patient presents with  . New Admit To SNF    Rehab following hospitalization 06/29/15 to 07/01/15 for right total knee arthroplasty by Dr. Gaynelle Arabian    HPI: Patient is a 81 y.o. male with h/o pituitary mass resection in 2000 requiring supplemental hormones, glaucoma, nephrolithiasis, BPH, renal cell ca s/p right nephrectomy in 1994, b12 deficiency, and recent difficulty with right knee severe osteoarthritis was seen today for admission to [facility] for PT, OT s/p right TKA on 06/29/15.  He was in the hospital through 11/2 and felt to need short term rehab.  His OA had been bone on bone. He tolerated his surgery well and did have 24 hrs of postop abx with ancef  And DVT prophylaxis with eliquis.  When seen, he was doing very well.  He's making good progress since the surgery.  He's been ambulating with a walker on the unit.  Bowels are moving.  He's sleeping well, eating well.  He had no new concerns.  He is to f/u with Dr. Wynelle Link on 07/14/15.    Review of Systems  Constitutional: Negative for fever, chills and malaise/fatigue.  HENT: Negative for congestion.   Eyes: Negative for blurred vision.       Glasses  Respiratory: Negative for shortness of breath.   Cardiovascular: Negative for chest pain.  Gastrointestinal: Negative for nausea, abdominal pain, constipation,  blood in stool and melena.  Genitourinary: Negative for dysuria.  Musculoskeletal: Positive for joint pain. Negative for falls.       Controlled with current pain regimen  Skin: Negative for rash.  Neurological: Negative for dizziness, loss of consciousness and weakness.  Endo/Heme/Allergies:       Postop hypopituitarism on hormones  Psychiatric/Behavioral: Negative for depression and memory loss.    Past Medical History  Diagnosis Date  . Hypertension   . Arthritis     Status post left total replacement 8 2011  . Pituitary mass ([place]) 2000    S/p transphenoidal excision 05/1999 ([facility])  . 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 Hypothyroidism (acquired) 2000  . Anemia, B12 deficiency 2000  . Hyperlipidemia 2003  . Vitamin D deficiency 2009  . History of renal cell carcinoma 1994    Status post right nephrectomy  . BPH (benign prostatic hyperplasia) 2013  . Glaucoma 2007    Status post left trabeculectomy 2007  . History of colon polyps     Colonoscopy 2001  . Allergic rhinitis     Prior allergy shots 20 years  . Colitis 2014  . Anemia   . Cancer ([place])     renal cell ca and skin cancer   . Nocturia   . History of kidney stones   . Difficult intubation 1994    surgery had to be  stopped due to injury to "throat"  . Difficult intubation  1994    no trouble since.  Required nasotracheal intubation  '02 [facility] / ANESTHESIA RECORD FROM 2013 AND 2016 IN EPIC   Past Surgical History  Procedure Laterality Date  . Tonsillectomy    . Eye surgery  over last 6 yrs.      trabeculectomy...   . Eye surgery   cat ext ou  . Brain surgery  2000    pituatary gland removed .  Marland Kitchen Joint replacement  2011    knee left  . Cholecystectomy  1984  . Reverse shoulder arthroplasty  12/15/2011    Procedure: REVERSE SHOULDER ARTHROPLASTY;  Surgeon: Marin Shutter, MD;  Location: [facility];  Service: Orthopedics;  Laterality: Right;  right total  reverse shoulder  . Nephrectomy Right 1994    Renal cell  . Transsphenoidal excision pituitary tumor  05/1999    A.Tommi Rumps, M.D.([facility])  . Ectropion surgery Bilateral 2006  . Mohs procedure       for skin cancer on nose   . Knee arthroscopy Right 02/06/2015    Procedure: RIGHT ARTHROSCOPY KNEE WITH LATERAL MENSICAL  DEBRIDEMENT;  Surgeon: Gaynelle Arabian, MD;  Location: [facility];  Service: Orthopedics;  Laterality: Right;  . Total knee arthroplasty Right 06/29/2015    Procedure: TOTAL KNEE ARTHROPLASTY;  Surgeon: Gaynelle Arabian, MD;  Location: [facility];  Service: Orthopedics;  Laterality: Right;    reports that he quit smoking about 26 years ago. His smoking use included Cigarettes and Pipe. He has never used smokeless tobacco. He reports that he drinks alcohol. He reports that he does not use illicit drugs. Family History  Problem Relation Age of Onset  . Anesthesia problems Neg Hx   . Hypotension Neg Hx   . Malignant hyperthermia Neg Hx   . Pseudochol deficiency Neg Hx   . Lung cancer Father     Allergies  Allergen Reactions  . Percocet [Oxycodone-Acetaminophen] Other (See Comments)    Just doesn't like it  . Diovan [Valsartan] Rash      Medication List       This list is accurate as of: 07/07/15 11:59 PM.  Always use your most recent med list.               acetaminophen 500 MG tablet  Commonly known as:  TYLENOL  Take 1,000 mg by mouth every 6 (six) hours as needed for moderate pain.     apixaban 2.5 MG Tabs tablet  Commonly known as:  ELIQUIS  Take 1 tablet (2.5 mg total) by mouth every 12 (twelve) hours. Take Eliquis twice a day for a total of three weeks, then discontinue Eliquis. Once the patient has completed the blood thinner regimen, then take a Baby 81 mg Aspirin daily for three more weeks.     bisacodyl 10 MG suppository  Commonly known as:  DULCOLAX  Place 1 suppository (10 mg total) rectally daily as needed for moderate constipation.     diphenhydrAMINE 12.5  MG/5ML elixir  Commonly known as:  BENADRYL  Take 5-10 mLs (12.5-25 mg total) by mouth every 4 (four) hours as needed for itching.     docusate sodium 100 MG capsule  Commonly known as:  COLACE  Take 100 mg by mouth at bedtime.     finasteride 5 MG tablet  Commonly known as:  PROSCAR  Take 5 mg by mouth every morning.     HYDROcodone-acetaminophen 5-325 MG tablet  Commonly known as:  NORCO  Take 1-2 tablets by mouth every 4 (  four) hours as needed for moderate pain.     levothyroxine 88 MCG tablet  Commonly known as:  SYNTHROID, LEVOTHROID  Take 88 mcg by mouth daily.     methocarbamol 500 MG tablet  Commonly known as:  ROBAXIN  Take 1 tablet (500 mg total) by mouth every 6 (six) hours as needed for muscle spasms.     metoCLOPramide 5 MG tablet  Commonly known as:  REGLAN  Take 1 tablet (5 mg total) by mouth every 8 (eight) hours as needed for nausea (if ondansetron (ZOFRAN) ineffective.).     olmesartan 20 MG tablet  Commonly known as:  BENICAR  Take 20 mg by mouth daily.     ondansetron 4 MG tablet  Commonly known as:  ZOFRAN  Take 1 tablet (4 mg total) by mouth every 6 (six) hours as needed for nausea.     pantoprazole 40 MG tablet  Commonly known as:  PROTONIX  Take 40 mg by mouth daily as needed (indigestion).     pravastatin 80 MG tablet  Commonly known as:  PRAVACHOL  Take 80 mg by mouth every evening.     predniSONE 5 MG tablet  Commonly known as:  DELTASONE  Take 1 tablet (5 mg total) by mouth daily with breakfast. Wednesday 07/01/2015 - Take 7.5 mg twice a day for one day. Thursday 07/02/2015 - Take 5 mg twice a day for one day. Friday 07/03/2015 - Resume Previous Home Dosing - Take 5 mg in the morning and 2.5 in the evening     sodium phosphate 7-19 GM/118ML Enem  Place 133 mLs (1 enema total) rectally once as needed for severe constipation.     SYSTANE OP  Apply 2 drops to eye at bedtime as needed (dry eyes). As needed     traMADol 50 MG tablet  Commonly  known as:  ULTRAM  Take 1-2 tablets (50-100 mg total) by mouth every 6 (six) hours as needed (mild pain).        Physical Exam: Filed Vitals:   07/07/15 1540  BP: 124/63  Pulse: 88  Temp: 97.9 F (36.6 C)  Resp: 12  Height: 5\' 11"  (1.803 m)  Weight: 193 lb (87.544 kg)   Body mass index is 26.93 kg/(m^2). Physical Exam  Constitutional: He is oriented to person, place, and time. He appears well-developed and well-nourished. No distress.  HENT:  Head: Normocephalic and atraumatic.  Right Ear: External ear normal.  Left Ear: External ear normal.  Nose: Nose normal.  Mouth/Throat: Oropharynx is clear and moist.  Eyes: Conjunctivae and EOM are normal. Pupils are equal, round, and reactive to light.  Neck: Normal range of motion. Neck supple. No JVD present.  Cardiovascular: Normal rate, regular rhythm, normal heart sounds and intact distal pulses.   Pulmonary/Chest: Effort normal and breath sounds normal.  Abdominal: Soft. Bowel sounds are normal. He exhibits no distension. There is no tenderness.  Musculoskeletal:  Using walker to ambulate with PT and participating with OT  Lymphadenopathy:    He has no cervical adenopathy.  Neurological: He is alert and oriented to person, place, and time.  Skin: Skin is warm and dry.  No erythema, drainage from incision; very mild warmth and swelling  Psychiatric: He has a normal mood and affect.    Labs reviewed: Basic Metabolic Panel:  Recent Labs  06/24/15 1155 06/30/15 0435 07/01/15 0410 07/11/15  NA 139 136 138 134*  K 5.1 3.8 4.4 5.0  CL 103 103 104  --  CO2 30 29 28   --   GLUCOSE 108* 132* 135*  --   BUN 25* 21* 17 28*  CREATININE 1.52* 1.34* 1.27* 1.3  CALCIUM 9.9 8.2* 8.8*  --    Liver Function Tests:  Recent Labs  06/24/15 1155  AST 24  ALT 20  ALKPHOS 50  BILITOT 0.7  PROT 6.8  ALBUMIN 4.0   No results for input(s): LIPASE, AMYLASE in the last 8760 hours. No results for input(s): AMMONIA in the last 8760  hours. CBC:  Recent Labs  06/24/15 1155 06/30/15 0435 07/01/15 0410 07/11/15  WBC 13.4* 14.7* 16.4* 9.7  HGB 14.1 10.4* 10.4* 9.7*  HCT 42.9 31.6* 31.4* 30*  MCV 102.9* 102.9* 100.3*  --   PLT 269 195 188 377   Cardiac Enzymes: No results for input(s): CKTOTAL, CKMB, CKMBINDEX, TROPONINI in the last 8760 hours. BNP: Invalid input(s): POCBNP No results found for: HGBA1C No results found for: TSH No results found for: VITAMINB12 No results found for: FOLATE No results found for: IRON, TIBC, FERRITIN  Assessment/Plan 1. Primary osteoarthritis of right knee -was end stage and required replacement  2. Status post total right knee replacement -underwent right tka on 10/31 -no s/s of infection at present -doing well with PT, OT and no complaints today with current pain regimen with norco, tramadol, tylenol--has not been using much pain medication -developed expected postop anemia with hgb down to 10 from 14--monitor and resume b12 he had been taking  3. Active advance directive - DNR (Do Not Resuscitate) on file and living will, hcpoa  4. 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 Elyse Hsu who regulates his hormones at his surgery -cont prednisone taper to regular dosing, testosterone injections, levothyroxine  5. Hypothyroidism (acquired) -cont current levothyroxine and monitor  6. Hypogonadism in male -cont testosterone supplement and vitamin D (?osteoporosis also)  7. Essential hypertension -cont current regimen with benicar  Functional status: is regaining independence--still needs some help with bathing, dressing  Family/ staff Communication: discussed with pt, rehab nurse and nurse manager  Labs/tests ordered:  No new needed  Italo Banton L. Miliana Gangwer, D.O. [facility] [address], [place] [address] Cell Phone (Mon-Fri 8am-5pm):  [phone] On Call:   [phone] & follow prompts after 5pm & weekends Office Phone:  [phone] Office Fax:  [phone]

## 2015-07-13 ENCOUNTER — Encounter: Payer: Self-pay | Admitting: Adult Health

## 2015-07-13 ENCOUNTER — Non-Acute Institutional Stay (SKILLED_NURSING_FACILITY): Payer: PPO | Admitting: Adult Health

## 2015-07-13 DIAGNOSIS — E893 Postprocedural hypopituitarism: Secondary | ICD-10-CM

## 2015-07-13 DIAGNOSIS — E039 Hypothyroidism, unspecified: Secondary | ICD-10-CM | POA: Diagnosis not present

## 2015-07-13 DIAGNOSIS — I10 Essential (primary) hypertension: Secondary | ICD-10-CM | POA: Diagnosis not present

## 2015-07-13 DIAGNOSIS — E860 Dehydration: Secondary | ICD-10-CM

## 2015-07-13 DIAGNOSIS — Z96651 Presence of right artificial knee joint: Secondary | ICD-10-CM

## 2015-07-13 DIAGNOSIS — Z96659 Presence of unspecified artificial knee joint: Secondary | ICD-10-CM | POA: Insufficient documentation

## 2015-07-13 DIAGNOSIS — E236 Other disorders of pituitary gland: Secondary | ICD-10-CM

## 2015-07-13 NOTE — Progress Notes (Signed)
This encounter was created in error - please disregard.

## 2015-07-13 NOTE — Progress Notes (Signed)
Patient ID: [name] [name]., male   DOB: Jun 19, 1923, 81 y.o.   MRN: [id number]     Nursing Home Location:  [place]   Code Status: DNR  Patient Care Team: Lorne Skeens, MD as PCP - General (Endocrinology) [facility] [place] of Service: SN[facility] [phone])  Chief Complaint  Patient presents with  . Discharge Note    HPI:  81 y.o. male residing at [facility], rehab section. I am evaluating him for d/c to independent living. He was admitted on 11/2 to rehab for therapy after a right TKA.  During his hospital stay his WBC count remained elevated at 16.2.  Presumably this was felt to be due to his chronic prednisone use and stress from surgery. Now it has normalized to 9.7. During his post op period his prednisone was increased to 7.5 mg BID (by the hospital) and then tapered back to his original dose of 5 mg in the am and 2.5 mg in the afternoon. He has a hx of hypopituitarism, s/p pituitary tumor removal in 2000.  He experienced some dizziness, low BP, and nausea during his stay. His am cortisol level was 2.5 at that time which was difficult to interpret given the fact that he is on long term steroids. His dose was increased to 10 mg BID for 3 days. An IV was ordered due to elevated BUN/Cr with concern for dehydration. He refused to do this but increased his water intake. His symptoms have subsided. Last BMP was acceptable given his underlying CKD. He is currently working with therapy and is ambulating independently with a walker.    Review of Systems:  Review of Systems  Constitutional: Negative for fever, chills, diaphoresis, activity change, appetite change and fatigue.  HENT: Negative for congestion, postnasal drip and rhinorrhea.   Respiratory: Negative for cough, shortness of breath and wheezing.   Cardiovascular: Positive for leg swelling. Negative for chest pain and palpitations.  Gastrointestinal: Negative for  nausea, vomiting, abdominal pain, diarrhea, constipation, blood in stool and abdominal distention.  Genitourinary: Positive for frequency. Negative for dysuria, hematuria, flank pain, discharge, difficulty urinating and penile pain.  Musculoskeletal: Positive for arthralgias. Negative for back pain.  Neurological: Negative for dizziness, seizures, syncope, facial asymmetry, speech difficulty, light-headedness, numbness and headaches.  Psychiatric/Behavioral: Negative for behavioral problems, confusion and agitation.    Medications: Patient's Medications  New Prescriptions   No medications on file  Previous Medications   ACETAMINOPHEN (TYLENOL) 500 MG TABLET    Take 1,000 mg by mouth every 6 (six) hours as needed for moderate pain.   APIXABAN (ELIQUIS) 2.5 MG TABS TABLET    Take 1 tablet (2.5 mg total) by mouth every 12 (twelve) hours. Take Eliquis twice a day for a total of three weeks, then discontinue Eliquis. Once the patient has completed the blood thinner regimen, then take a Baby 81 mg Aspirin daily for three more weeks.   BISACODYL (DULCOLAX) 10 MG SUPPOSITORY    Place 1 suppository (10 mg total) rectally daily as needed for moderate constipation.   DIPHENHYDRAMINE (BENADRYL) 12.5 MG/5ML ELIXIR    Take 5-10 mLs (12.5-25 mg total) by mouth every 4 (four) hours as needed for itching.   DOCUSATE SODIUM (COLACE) 100 MG CAPSULE    Take 100 mg by mouth at bedtime.    FINASTERIDE (PROSCAR) 5 MG TABLET    Take 5 mg by mouth every morning.    HYDROCODONE-ACETAMINOPHEN (NORCO) 5-325 MG TABLET  Take 1-2 tablets by mouth every 4 (four) hours as needed for moderate pain.   LEVOTHYROXINE (SYNTHROID, LEVOTHROID) 88 MCG TABLET    Take 88 mcg by mouth daily.   METHOCARBAMOL (ROBAXIN) 500 MG TABLET    Take 1 tablet (500 mg total) by mouth every 6 (six) hours as needed for muscle spasms.   METOCLOPRAMIDE (REGLAN) 5 MG TABLET    Take 1 tablet (5 mg total) by mouth every 8 (eight) hours as needed for  nausea (if ondansetron (ZOFRAN) ineffective.).   ONDANSETRON (ZOFRAN) 4 MG TABLET    Take 1 tablet (4 mg total) by mouth every 6 (six) hours as needed for nausea.   PANTOPRAZOLE (PROTONIX) 40 MG TABLET    Take 40 mg by mouth daily as needed (indigestion).   POLYETHYL GLYCOL-PROPYL GLYCOL (SYSTANE OP)    Apply 2 drops to eye at bedtime as needed (dry eyes). As needed   PRAVASTATIN (PRAVACHOL) 80 MG TABLET    Take 80 mg by mouth every evening.    SODIUM PHOSPHATE (FLEET) 7-19 GM/118ML ENEM    Place 133 mLs (1 enema total) rectally once as needed for severe constipation.   TRAMADOL (ULTRAM) 50 MG TABLET    Take 1-2 tablets (50-100 mg total) by mouth every 6 (six) hours as needed (mild pain).  Modified Medications   No medications on file  Discontinued Medications   OLMESARTAN (BENICAR) 20 MG TABLET    Take 20 mg by mouth daily.   PREDNISONE (DELTASONE) 5 MG TABLET    Take 1 tablet (5 mg total) by mouth daily with breakfast. Wednesday 07/01/2015 - Take 7.5 mg twice a day for one day. Thursday 07/02/2015 - Take 5 mg twice a day for one day. Friday 07/03/2015 - Resume Previous Home Dosing - Take 5 mg in the morning and 2.5 in the evening     Physical Exam:  There were no vitals filed for this visit.  Physical Exam  Constitutional: He is oriented to person, place, and time. No distress.  HENT:  Head: Normocephalic and atraumatic.  Neck: Normal range of motion. Neck supple. No JVD present. No thyromegaly present.  Cardiovascular: Normal rate and regular rhythm.   No murmur heard. Distant HS. Edema +1 to RLE  Pulmonary/Chest: Effort normal and breath sounds normal. No respiratory distress. He has no wheezes.  Abdominal: Soft. Bowel sounds are normal. He exhibits no distension. There is no tenderness.  No CVA tenderness, no s/p tenderness  Musculoskeletal:  Right knee brace in place  Neurological: He is alert and oriented to person, place, and time. No cranial nerve deficit.  Skin: Skin is warm  and dry. He is not diaphoretic.  Psychiatric: Affect normal.    Wt Readings from Last 3 Encounters:  07/07/15 193 lb (87.544 kg)  06/29/15 193 lb (87.544 kg)  06/24/15 193 lb (87.544 kg)     Labs reviewed/Significant Diagnostic Results:  Basic Metabolic Panel:  Recent Labs  06/24/15 1155 06/30/15 0435 07/01/15 0410 07/11/15  NA 139 136 138 134*  K 5.1 3.8 4.4 5.0  CL 103 103 104  --   CO2 30 29 28   --   GLUCOSE 108* 132* 135*  --   BUN 25* 21* 17 28*  CREATININE 1.52* 1.34* 1.27* 1.3  CALCIUM 9.9 8.2* 8.8*  --    Liver Function Tests:  Recent Labs  06/24/15 1155  AST 24  ALT 20  ALKPHOS 50  BILITOT 0.7  PROT 6.8  ALBUMIN 4.0  No results for input(s): LIPASE, AMYLASE in the last 8760 hours. No results for input(s): AMMONIA in the last 8760 hours. CBC:  Recent Labs  06/24/15 1155 06/30/15 0435 07/01/15 0410 07/11/15  WBC 13.4* 14.7* 16.4* 9.7  HGB 14.1 10.4* 10.4* 9.7*  HCT 42.9 31.6* 31.4* 30*  MCV 102.9* 102.9* 100.3*  --   PLT 269 195 188 377   CBG: No results for input(s): GLUCAP in the last 8760 hours. TSH: No results for input(s): TSH in the last 8760 hours. A1C: No results found for: HGBA1C Lipid Panel: No results for input(s): CHOL, HDL, LDLCALC, TRIG, CHOLHDL, LDLDIRECT in the last 8760 hours.     Assessment/Plan  1. Status post total right knee replacement -due to OA -continue PT outpatient -may have norco prn but has not used it during rehab -continue Eliquis for 3 weeks then change to ASA 81 mg for 3 more weeks then d/c for dvt prophylaxis per ortho -f/u apt with Dr. Maureen Ralphs in the am  2. 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 Elyse Hsu and will continue Prednisone at 5 mg in the am and 2.5 mg in the pm  3. Hypothyroidism (acquired) -continue Synthroid  4. Essential hypertension -off benicar now due to low BP -resident is going to monitor BP at home and follow up with PCP  5.  Dehydration -resolved  6. Anemia -due to CKD and acute blood loss anemia from surgery -no intervention, continue to monitor outpt  [name] [name] [place] ready to return to [place] and will need therapy continued outpt. I instructed [name] [name] to follow up with his PCP in regards to anemia as his Hgb has dropped from 14 to 9.7     Cindi Carbon, [place] [phone]

## 2015-07-24 ENCOUNTER — Emergency Department (HOSPITAL_COMMUNITY): Payer: PPO

## 2015-07-24 ENCOUNTER — Emergency Department (HOSPITAL_COMMUNITY)
Admission: EM | Admit: 2015-07-24 | Discharge: 2015-07-25 | Disposition: A | Discharge status: Home / Self Care | Payer: PPO | Attending: Emergency Medicine | Admitting: Emergency Medicine

## 2015-07-24 ENCOUNTER — Encounter (HOSPITAL_COMMUNITY): Payer: Self-pay | Admitting: Nurse Practitioner

## 2015-07-24 DIAGNOSIS — Z7952 Long term (current) use of systemic steroids: Secondary | ICD-10-CM | POA: Diagnosis not present

## 2015-07-24 DIAGNOSIS — I10 Essential (primary) hypertension: Secondary | ICD-10-CM | POA: Diagnosis not present

## 2015-07-24 DIAGNOSIS — Z8719 Personal history of other diseases of the digestive system: Secondary | ICD-10-CM | POA: Insufficient documentation

## 2015-07-24 DIAGNOSIS — Z85528 Personal history of other malignant neoplasm of kidney: Secondary | ICD-10-CM | POA: Insufficient documentation

## 2015-07-24 DIAGNOSIS — E039 Hypothyroidism, unspecified: Secondary | ICD-10-CM | POA: Diagnosis not present

## 2015-07-24 DIAGNOSIS — Z862 Personal history of diseases of the blood and blood-forming organs and certain disorders involving the immune mechanism: Secondary | ICD-10-CM | POA: Insufficient documentation

## 2015-07-24 DIAGNOSIS — Z7902 Long term (current) use of antithrombotics/antiplatelets: Secondary | ICD-10-CM | POA: Insufficient documentation

## 2015-07-24 DIAGNOSIS — R630 Anorexia: Secondary | ICD-10-CM | POA: Diagnosis not present

## 2015-07-24 DIAGNOSIS — E785 Hyperlipidemia, unspecified: Secondary | ICD-10-CM | POA: Insufficient documentation

## 2015-07-24 DIAGNOSIS — Z85828 Personal history of other malignant neoplasm of skin: Secondary | ICD-10-CM | POA: Diagnosis not present

## 2015-07-24 DIAGNOSIS — R5381 Other malaise: Secondary | ICD-10-CM | POA: Diagnosis not present

## 2015-07-24 DIAGNOSIS — Z86010 Personal history of colonic polyps: Secondary | ICD-10-CM | POA: Insufficient documentation

## 2015-07-24 DIAGNOSIS — N400 Enlarged prostate without lower urinary tract symptoms: Secondary | ICD-10-CM | POA: Diagnosis not present

## 2015-07-24 DIAGNOSIS — Z8669 Personal history of other diseases of the nervous system and sense organs: Secondary | ICD-10-CM | POA: Insufficient documentation

## 2015-07-24 DIAGNOSIS — Z87442 Personal history of urinary calculi: Secondary | ICD-10-CM | POA: Insufficient documentation

## 2015-07-24 DIAGNOSIS — Z87891 Personal history of nicotine dependence: Secondary | ICD-10-CM | POA: Diagnosis not present

## 2015-07-24 DIAGNOSIS — M1990 Unspecified osteoarthritis, unspecified site: Secondary | ICD-10-CM | POA: Insufficient documentation

## 2015-07-24 DIAGNOSIS — Z7982 Long term (current) use of aspirin: Secondary | ICD-10-CM | POA: Insufficient documentation

## 2015-07-24 DIAGNOSIS — R110 Nausea: Secondary | ICD-10-CM

## 2015-07-24 DIAGNOSIS — E559 Vitamin D deficiency, unspecified: Secondary | ICD-10-CM | POA: Insufficient documentation

## 2015-07-24 DIAGNOSIS — Z79899 Other long term (current) drug therapy: Secondary | ICD-10-CM | POA: Diagnosis not present

## 2015-07-24 LAB — COMPREHENSIVE METABOLIC PANEL
ALK PHOS: 52 U/L (ref 38–126)
ALT: 14 U/L — AB (ref 17–63)
ANION GAP: 6 (ref 5–15)
AST: 16 U/L (ref 15–41)
Albumin: 3.1 g/dL — ABNORMAL LOW (ref 3.5–5.0)
BUN: 19 mg/dL (ref 6–20)
CALCIUM: 8.8 mg/dL — AB (ref 8.9–10.3)
CO2: 27 mmol/L (ref 22–32)
CREATININE: 1.6 mg/dL — AB (ref 0.61–1.24)
Chloride: 100 mmol/L — ABNORMAL LOW (ref 101–111)
GFR, EST AFRICAN AMERICAN: 42 mL/min — AB (ref >60)
GFR, EST NON AFRICAN AMERICAN: 36 mL/min — AB (ref >60)
Glucose, Bld: 95 mg/dL (ref 65–99)
Potassium: 3.9 mmol/L (ref 3.5–5.1)
SODIUM: 133 mmol/L — AB (ref 135–145)
TOTAL PROTEIN: 5.6 g/dL — AB (ref 6.5–8.1)
Total Bilirubin: 0.8 mg/dL (ref 0.3–1.2)

## 2015-07-24 LAB — CBC
HCT: 31.8 % — ABNORMAL LOW (ref 39.0–52.0)
HEMOGLOBIN: 10.1 g/dL — AB (ref 13.0–17.0)
MCH: 32.1 pg (ref 26.0–34.0)
MCHC: 31.8 g/dL (ref 30.0–36.0)
MCV: 101 fL — ABNORMAL HIGH (ref 78.0–100.0)
PLATELETS: 315 10*3/uL (ref 150–400)
RBC: 3.15 MIL/uL — AB (ref 4.22–5.81)
RDW: 14.7 % (ref 11.5–15.5)
WBC: 10.7 10*3/uL — AB (ref 4.0–10.5)

## 2015-07-24 LAB — URINALYSIS, ROUTINE W REFLEX MICROSCOPIC
Bilirubin Urine: NEGATIVE
Glucose, UA: NEGATIVE mg/dL
HGB URINE DIPSTICK: NEGATIVE
KETONES UR: NEGATIVE mg/dL
LEUKOCYTES UA: NEGATIVE
NITRITE: NEGATIVE
Protein, ur: NEGATIVE mg/dL
SPECIFIC GRAVITY, URINE: 1.014 (ref 1.005–1.030)
pH: 6.5 (ref 5.0–8.0)

## 2015-07-24 LAB — I-STAT CG4 LACTIC ACID, ED: Lactic Acid, Venous: 0.78 mmol/L (ref 0.5–2.0)

## 2015-07-24 LAB — LIPASE, BLOOD: Lipase: 43 U/L (ref 11–51)

## 2015-07-24 LAB — I-STAT TROPONIN, ED: Troponin i, poc: 0.02 ng/mL (ref 0.00–0.08)

## 2015-07-24 MED ORDER — ONDANSETRON HCL 4 MG/2ML IJ SOLN: 4.0000 mg | Freq: Once | INTRAMUSCULAR | Status: DC | PRN

## 2015-07-24 MED ORDER — IOHEXOL 300 MG/ML  SOLN
25.0000 mL | Freq: Once | INTRAMUSCULAR | Status: AC | PRN
Start: 2015-07-24 — End: 2015-07-24
  Administered 2015-07-24: 25 mL via ORAL

## 2015-07-24 MED ORDER — SODIUM CHLORIDE 0.9 % IV BOLUS (SEPSIS)
1000.0000 mL | Freq: Once | INTRAVENOUS | Status: AC
  Administered 2015-07-24: 1000 mL via INTRAVENOUS

## 2015-07-24 NOTE — ED Provider Notes (Signed)
CSN: [id number]     Arrival date & time 07/24/15  1959 History   First MD Initiated Contact with Patient 07/24/15 2032     Chief Complaint  Patient presents with  . Nausea  . Malaise      (Consider location/radiation/quality/duration/timing/severity/associated sxs/prior Treatment) HPI  81 year old male who presents with nausea and malaise. Has  Remote history of hypopituitarism after transphenoidal resection of pituitary mass, hypothyroidism, remote RCC s/p R nephrectomy, and HTN. From [place] home.  States several week history of intermittent nausea with generalized malaise and decreased appetite.  This is happened to him once before in the setting of acute pancreatitis and once before in the setting of colitis. Has been taking intermittent Zofran in the setting. Has not had any headaches, vomiting, vision changes, speech changes, numbness or weakness. He has not had any abdominal pain, diarrhea, melena or hematochezia. Denies any associating chest pain, difficulty breathing.  Has not had any associating fevers, chills, night sweats, or urinary complaints. States that he has been more constipated recently with small infrequent stools, but this is also in setting of decreased appetite and decreased PO intake. Was evaluated by his primary care physician a week ago and told  Him that this may be pancreatitis or colitis again. Due to persistent symptoms he came to the ED for evaluation.   Past Medical History  Diagnosis Date  . Hypertension   . Arthritis     Status post left total replacement 8 2011  . Pituitary mass ([place]) 2000    S/p transphenoidal excision 05/1999 ([facility])  . 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 Hypothyroidism (acquired) 2000  . Anemia, B12 deficiency 2000  . Hyperlipidemia 2003  . Vitamin D deficiency 2009  . History of renal cell carcinoma 1994    Status post right nephrectomy  . BPH (benign  prostatic hyperplasia) 2013  . Glaucoma 2007    Status post left trabeculectomy 2007  . History of colon polyps     Colonoscopy 2001  . Allergic rhinitis     Prior allergy shots 20 years  . Colitis 2014  . Anemia   . Cancer ([place])     renal cell ca and skin cancer   . Nocturia   . History of kidney stones   . Difficult intubation 1994    surgery had to be  stopped due to injury to "throat"  . Difficult intubation 1994    no trouble since.  Required nasotracheal intubation  '02 [facility] / ANESTHESIA RECORD FROM 2013 AND 2016 IN EPIC   Past Surgical History  Procedure Laterality Date  . Tonsillectomy    . Eye surgery  over last 6 yrs.      trabeculectomy...   . Eye surgery   cat ext ou  . Brain surgery  2000    pituatary gland removed .  Marland Kitchen Joint replacement  2011    knee left  . Cholecystectomy  1984  . Reverse shoulder arthroplasty  12/15/2011    Procedure: REVERSE SHOULDER ARTHROPLASTY;  Surgeon: Marin Shutter, MD;  Location: [facility];  Service: Orthopedics;  Laterality: Right;  right total reverse shoulder  . Nephrectomy Right 1994    Renal cell  . Transsphenoidal excision pituitary tumor  05/1999    A.Tommi Rumps, M.D.([facility])  . Ectropion surgery Bilateral 2006  . Mohs procedure       for skin cancer on nose   . Knee arthroscopy Right  02/06/2015    Procedure: RIGHT ARTHROSCOPY KNEE WITH LATERAL MENSICAL  DEBRIDEMENT;  Surgeon: Gaynelle Arabian, MD;  Location: [facility];  Service: Orthopedics;  Laterality: Right;  . Total knee arthroplasty Right 06/29/2015    Procedure: TOTAL KNEE ARTHROPLASTY;  Surgeon: Gaynelle Arabian, MD;  Location: [facility];  Service: Orthopedics;  Laterality: Right;   Family History  Problem Relation Age of Onset  . Anesthesia problems Neg Hx   . Hypotension Neg Hx   . Malignant hyperthermia Neg Hx   . Pseudochol deficiency Neg Hx   . Lung cancer Father    Social History  Substance Use Topics  . Smoking status: Former Smoker    Types: Cigarettes, Pipe    Quit  date: 09/26/1988  . Smokeless tobacco: Never Used  . Alcohol Use: Yes     Comment: 1.5-2 ounces of vodka nightly     Review of Systems 10/14 systems reviewed and are negative other than those stated in the HPI    Allergies  Percocet and Diovan  Home Medications   Prior to Admission medications   Medication Sig Start Date End Date Taking? Authorizing Provider  acetaminophen (TYLENOL) 500 MG tablet Take 1,000 mg by mouth every 6 (six) hours as needed for moderate pain.   Yes Historical Provider, MD  aspirin EC 81 MG tablet Take 81 mg by mouth daily.   Yes Historical Provider, MD  bisacodyl (DULCOLAX) 10 MG suppository Place 1 suppository (10 mg total) rectally daily as needed for moderate constipation. 07/01/15  Yes Arlee Muslim, PA-C  co-enzyme Q-10 30 MG capsule Take 30 mg by mouth daily.   Yes Historical Provider, MD  docusate sodium (COLACE) 100 MG capsule Take 100 mg by mouth at bedtime.    Yes Historical Provider, MD  finasteride (PROSCAR) 5 MG tablet Take 5 mg by mouth every morning.    Yes Historical Provider, MD  levothyroxine (SYNTHROID, LEVOTHROID) 88 MCG tablet Take 88 mcg by mouth daily.   Yes Historical Provider, MD  methocarbamol (ROBAXIN) 500 MG tablet Take 1 tablet (500 mg total) by mouth every 6 (six) hours as needed for muscle spasms. 07/01/15  Yes Arlee Muslim, PA-C  Multiple Vitamins-Minerals (MULTIVITAMIN & MINERAL PO) Take 1 tablet by mouth daily.   Yes Historical Provider, MD  ondansetron (ZOFRAN) 4 MG tablet Take 1 tablet (4 mg total) by mouth every 6 (six) hours as needed for nausea. 07/01/15  Yes Arlee Muslim, PA-C  Polyethyl Glycol-Propyl Glycol (SYSTANE OP) Apply 2 drops to eye at bedtime as needed (dry eyes). As needed   Yes Historical Provider, MD  pravastatin (PRAVACHOL) 80 MG tablet Take 80 mg by mouth every evening.    Yes Historical Provider, MD  predniSONE (DELTASONE) 2.5 MG tablet Take 2.5-5 mg by mouth 2 (two) times daily with a meal. Take 2 tablets (5  mg) in the am and Take 1 tablet (2.5 mg) in the evening.   Yes Historical Provider, MD  sodium phosphate (FLEET) 7-19 GM/118ML ENEM Place 133 mLs (1 enema total) rectally once as needed for severe constipation. 07/01/15  Yes Arlee Muslim, PA-C  vitamin B-12 (CYANOCOBALAMIN) 1000 MCG tablet Take 1,000 mcg by mouth daily.   Yes Historical Provider, MD  VITAMIN D, ERGOCALCIFEROL, PO Take 5,000 Units by mouth daily.   Yes Historical Provider, MD  apixaban (ELIQUIS) 2.5 MG TABS tablet Take 1 tablet (2.5 mg total) by mouth every 12 (twelve) hours. Take Eliquis twice a day for a total of three weeks, then discontinue  Eliquis. Once the patient has completed the blood thinner regimen, then take a Baby 81 mg Aspirin daily for three more weeks. Patient not taking: Reported on 07/24/2015 07/01/15   Arlee Muslim, PA-C  diphenhydrAMINE (BENADRYL) 12.5 MG/5ML elixir Take 5-10 mLs (12.5-25 mg total) by mouth every 4 (four) hours as needed for itching. 07/01/15   Arlee Muslim, PA-C  HYDROcodone-acetaminophen (NORCO) 5-325 MG tablet Take 1-2 tablets by mouth every 4 (four) hours as needed for moderate pain. 07/01/15   Arlee Muslim, PA-C  metoCLOPramide (REGLAN) 5 MG tablet Take 1 tablet (5 mg total) by mouth every 8 (eight) hours as needed for nausea (if ondansetron (ZOFRAN) ineffective.). 07/01/15   Arlee Muslim, PA-C  traMADol (ULTRAM) 50 MG tablet Take 1-2 tablets (50-100 mg total) by mouth every 6 (six) hours as needed (mild pain). Patient not taking: Reported on 07/24/2015 07/01/15   Arlee Muslim, PA-C   BP 148/57 mmHg  Pulse 79  Temp(Src) 99.3 F (37.4 C) (Oral)  Resp 20  SpO2 97% Physical Exam Physical Exam  Nursing note and vitals reviewed. Constitutional: Well developed, well nourished, non-toxic, and in no acute distress Head: Normocephalic and atraumatic.  Mouth/Throat: Oropharynx is clear and moist.  Neck: Normal range of motion. Neck supple.  Cardiovascular: Normal rate and regular rhythm.    Pulmonary/Chest: Effort normal and breath sounds normal.  Abdominal: Soft. Minimal distension. There is no tenderness. There is no rebound and no guarding.  Musculoskeletal: Normal range of motion.  Neurological: Alert, no facial droop, fluent speech, moves all extremities symmetrically, sensation to light touch in tact throughout Skin: Skin is warm and dry.  Psychiatric: Cooperative  ED Course  Procedures (including critical care time) Labs Review Labs Reviewed  COMPREHENSIVE METABOLIC PANEL - Abnormal; Notable for the following:    Sodium 133 (*)    Chloride 100 (*)    Creatinine, Ser 1.60 (*)    Calcium 8.8 (*)    Total Protein 5.6 (*)    Albumin 3.1 (*)    ALT 14 (*)    GFR calc non Af Amer 36 (*)    GFR calc Af Amer 42 (*)    All other components within normal limits  CBC - Abnormal; Notable for the following:    WBC 10.7 (*)    RBC 3.15 (*)    Hemoglobin 10.1 (*)    HCT 31.8 (*)    MCV 101.0 (*)    All other components within normal limits  LIPASE, BLOOD  URINALYSIS, ROUTINE W REFLEX MICROSCOPIC (NOT AT [facility])  I-STAT CG4 LACTIC ACID, ED  Randolm Idol, ED    Imaging Review Dg Abd Acute W/chest  07/24/2015  CLINICAL DATA:  Patient presented from rehab after knee surgery. Patient has felt on well for the last 3- 4 days with nausea. Constipation. History of hypertension, renal cell carcinoma, colon polyps, and colitis. Former smoker. EXAM: DG ABDOMEN ACUTE W/ 1V CHEST COMPARISON:  CT abdomen and pelvis 05/31/2014. Abdomen series 10315. FINDINGS: Normal heart size and pulmonary vascularity. No focal airspace disease or consolidation in the lungs. No blunting of costophrenic angles. No pneumothorax. Mediastinal contours appear intact. Calcified and tortuous aorta. Postoperative reverse right shoulder arthroplasty. Scattered gas and stool in the colon. Scattered gas-filled small bowel loops. No small or large bowel distention. No free intra-abdominal air. No abnormal  air-fluid levels. Changes likely represent mild ileus or possibly enteritis. Surgical clips in the right abdomen. Degenerative changes in the spine and hips. No radiopaque stones. IMPRESSION: No  evidence of active pulmonary disease. Scattered gas-filled nondistended small bowel may represent ileus or enteritis. No evidence of obstruction. Electronically Signed   By: Lucienne Capers M.D.   On: 07/24/2015 21:37   I have personally reviewed and evaluated these images and lab results as part of my medical decision-making.   EKG Interpretation   Date/Time:  Friday July 24 2015 21:37:16 EST Ventricular Rate:  85 PR Interval:  204 QRS Duration: 146 QT Interval:  401 QTC Calculation: 477 R Axis:   -50 Text Interpretation:  Sinus rhythm RBBB and LAFB No significant change  since last tracing Confirmed by Isis Costanza MD, Jillyan Plitt [phone]) on 07/24/2015  10:33:11 PM      MDM   Final diagnoses:  Nausea  Decreased appetite     In short this is a 81 year old male with  Remote history of renal cell carcinoma status post right nephrectomy and pituitary mass  Status post transphenoidal excision with hypopituitarism  Who presents with several weeks of nausea. He nontoxic and in no acute distress on presentation. Vital signs are not concerning. He has a soft and benign abdomen, neurologically intact. Remainder of exam unremarkable. Symptoms vague and difficult to pinpoint. Presents for several weeks without clear etiology.   Basic blood work here revealing no major electrolyte or metabolic derangements. He has a stable macrocytic anemia, consistent with his known B12 deficiency. Mild AKI with a creatinine of 1.6 likely in the setting of his decreased by mouth intake. UA normal, no other signs of acute infection.  EKG is nonischemic and troponin is negative. I doubt that this is an atypical presentation of ACS especially given ongoing symptoms for several weeks now.  X-ray of his abdomen does not show significant  amount of stool to suggest  severe constipation. Will obtain CT a/p to rule out mass/malignancy or obstruction. Will obtain CT head to rule out intracranial mass.  Imaging studies are visualized and negative for acute intracranial intra-abdominal processes. This time, I am unclear of the etiology of his nausea. However I do feel that he has been adequately ruled out at this time for any serious causes. I given that this is ongoing for several weeks, I had recommended that he continue to follow very closely with his PCP for additional workup. Strict return instructions were also reviewed with this patient and his family members. Headaches presently standing all discharge instructions and felt comfortable to plan of care.     Forde Dandy, MD 07/25/15 [phone]

## 2015-07-24 NOTE — ED Notes (Signed)
Bed: WA09 Expected date:  Expected time:  Means of arrival:  Comments: 59M From [facility] n x 2 weeks, RBBB

## 2015-07-24 NOTE — ED Notes (Signed)
Pt presented from [facility] rehab after knee surgery. Report feeling unwell for the last 3-4 days and being nauseated. Denies any other complaints.

## 2015-07-24 NOTE — ED Notes (Signed)
Nurse drawing laabs

## 2015-07-25 ENCOUNTER — Emergency Department (HOSPITAL_COMMUNITY): Payer: PPO

## 2015-07-25 MED ORDER — IOHEXOL 300 MG/ML  SOLN
100.0000 mL | Freq: Once | INTRAMUSCULAR | Status: AC | PRN
  Administered 2015-07-25: 80 mL via INTRAVENOUS

## 2015-07-25 NOTE — Discharge Instructions (Signed)
Return for worsening symptoms, including vomiting and unable to keep down food/fluids, fevers, confusion, or any other symptoms concerning to you.  Nausea, Adult Nausea means you feel sick to your stomach or need to throw up (vomit). It may be a sign of a more serious problem. If nausea gets worse, you may throw up. If you throw up a lot, you may lose too much body fluid (dehydration). HOME CARE   Get plenty of rest.  Ask your doctor how to replace body fluid losses (rehydrate).  Eat small amounts of food. Sip liquids more often.  Take all medicines as told by your doctor. GET HELP RIGHT AWAY IF:  You have a fever.  You pass out (faint).  You keep throwing up or have blood in your throw up.  You are very weak, have dry lips or a dry mouth, or you are very thirsty (dehydrated).  You have dark or bloody poop (stool).  You have very bad chest or belly (abdominal) pain.  You do not get better after 2 days, or you get worse.  You have a headache. MAKE SURE YOU:  Understand these instructions.  Will watch your condition.  Will get help right away if you are not doing well or get worse.   This information is not intended to replace advice given to you by your health care provider. Make sure you discuss any questions you have with your health care provider.   Document Released: 08/04/2011 Document Revised: 11/07/2011 Document Reviewed: 08/04/2011 Elsevier Interactive Patient Education [employer].

## 2015-07-28 ENCOUNTER — Non-Acute Institutional Stay (SKILLED_NURSING_FACILITY): Payer: PPO | Admitting: Internal Medicine

## 2015-07-28 ENCOUNTER — Encounter: Payer: Self-pay | Admitting: Internal Medicine

## 2015-07-28 DIAGNOSIS — Z96651 Presence of right artificial knee joint: Secondary | ICD-10-CM | POA: Diagnosis not present

## 2015-07-28 DIAGNOSIS — E039 Hypothyroidism, unspecified: Secondary | ICD-10-CM

## 2015-07-28 DIAGNOSIS — E236 Other disorders of pituitary gland: Secondary | ICD-10-CM

## 2015-07-28 DIAGNOSIS — D519 Vitamin B12 deficiency anemia, unspecified: Secondary | ICD-10-CM

## 2015-07-28 DIAGNOSIS — I10 Essential (primary) hypertension: Secondary | ICD-10-CM | POA: Diagnosis not present

## 2015-07-28 DIAGNOSIS — R110 Nausea: Secondary | ICD-10-CM | POA: Diagnosis not present

## 2015-07-28 DIAGNOSIS — N400 Enlarged prostate without lower urinary tract symptoms: Secondary | ICD-10-CM | POA: Diagnosis not present

## 2015-07-28 DIAGNOSIS — E860 Dehydration: Secondary | ICD-10-CM

## 2015-07-28 DIAGNOSIS — E893 Postprocedural hypopituitarism: Secondary | ICD-10-CM

## 2015-07-28 NOTE — Progress Notes (Signed)
Patient ID: [name] [name]., male   DOB: [birth year], 81 y.o.   MRN: [id number]  Provider:  Rexene Edison. Mariea Clonts, D.O., C.M.D. Location:  [facility] PCP: [name], MD  Code Status: DNR Goals of Care: Advanced Directive information Does patient have an advance directive?: Yes, Type of Advance Directive: [place];Out of facility DNR (pink MOST or yellow form), Pre-existing out of facility DNR order (yellow form or pink MOST form): Yellow form placed in chart (order not valid for inpatient use);Pink MOST form placed in chart (order not valid for inpatient use)   Chief Complaint  Patient presents with  . New Admit To SNF    Rehab for nausea, vomiting. Went to ER 07/24/15 for nausea, malaise    HPI: Patient is a 81 y.o. male with h/o panhypopituitarism, remote RCC s/p right nephrectomy, HTN and recent right TKA for severe OA was seen today for admission to [facility] rehab s/p ED visit with 3-4 days of nausea and malaise.  He had decreased appetite and had not been taking in adequate fluids and was not eating consistent meals since returning home from rehab for his right TKA.  There were concerns that he may have colitis or pancreatitis but his labs revealed only mild acute renal failure from poor po intake, but UA, EKG, troponin, abdominal xray, CT abdomen/pelvis and CT brain were unremarkable as was his lipase.   He had stable macrocytic anemia from b12 deficiency.  ROS  Past Medical History  Diagnosis Date  . Hypertension   . Arthritis     Status post left total replacement 8 2011  . Pituitary mass ([place]) 2000    S/p transphenoidal excision 05/1999 ([facility])  . 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 Hypothyroidism (acquired) 2000  . Anemia, B12 deficiency 2000  . Hyperlipidemia 2003  . Vitamin D deficiency 2009  . History of renal cell carcinoma 1994    Status post right nephrectomy  . BPH (benign  prostatic hyperplasia) 2013  . Glaucoma 2007    Status post left trabeculectomy 2007  . History of colon polyps     Colonoscopy 2001  . Allergic rhinitis     Prior allergy shots 20 years  . Colitis 2014  . Anemia   . Cancer ([place])     renal cell ca and skin cancer   . Nocturia   . History of kidney stones   . Difficult intubation 1994    surgery had to be  stopped due to injury to "throat"  . Difficult intubation 1994    no trouble since.  Required nasotracheal intubation  '02 [facility] / ANESTHESIA RECORD FROM 2013 AND 2016 IN EPIC   Past Surgical History  Procedure Laterality Date  . Tonsillectomy    . Eye surgery  over last 6 yrs.      trabeculectomy...   . Eye surgery   cat ext ou  . Brain surgery  2000    pituatary gland removed .  Marland Kitchen Joint replacement  2011    knee left  . Cholecystectomy  1984  . Reverse shoulder arthroplasty  12/15/2011    Procedure: REVERSE SHOULDER ARTHROPLASTY;  Surgeon: Marin Shutter, MD;  Location: [facility];  Service: Orthopedics;  Laterality: Right;  right total reverse shoulder  . Nephrectomy Right 1994    Renal cell  . Transsphenoidal excision pituitary tumor  05/1999    A.Tommi Rumps, M.D.([facility])  . Ectropion surgery Bilateral 2006  .  Mohs procedure       for skin cancer on nose   . Knee arthroscopy Right 02/06/2015    Procedure: RIGHT ARTHROSCOPY KNEE WITH LATERAL MENSICAL  DEBRIDEMENT;  Surgeon: Gaynelle Arabian, MD;  Location: [facility];  Service: Orthopedics;  Laterality: Right;  . Total knee arthroplasty Right 06/29/2015    Procedure: TOTAL KNEE ARTHROPLASTY;  Surgeon: Gaynelle Arabian, MD;  Location: [facility];  Service: Orthopedics;  Laterality: Right;    reports that he quit smoking about 26 years ago. His smoking use included Cigarettes and Pipe. He has never used smokeless tobacco. He reports that he drinks alcohol. He reports that he does not use illicit drugs. Family History  Problem Relation Age of Onset  . Anesthesia problems Neg Hx   . Hypotension  Neg Hx   . Malignant hyperthermia Neg Hx   . Pseudochol deficiency Neg Hx   . Lung cancer Father     Allergies  Allergen Reactions  . Percocet [Oxycodone-Acetaminophen] Other (See Comments)    Just doesn't like it  . Diovan [Valsartan] Rash      Medication List       This list is accurate as of: 07/28/15 11:59 PM.  Always use your most recent med list.               acetaminophen 500 MG tablet  Commonly known as:  TYLENOL  Take 1,000 mg by mouth every 6 (six) hours as needed for moderate pain.     aspirin EC 81 MG tablet  Take 81 mg by mouth daily.     bisacodyl 10 MG suppository  Commonly known as:  DULCOLAX  Place 1 suppository (10 mg total) rectally daily as needed for moderate constipation.     co-enzyme Q-10 30 MG capsule  Take 30 mg by mouth daily.     docusate sodium 100 MG capsule  Commonly known as:  COLACE  Take 100 mg by mouth at bedtime.     finasteride 5 MG tablet  Commonly known as:  PROSCAR  Take 5 mg by mouth every morning.     levothyroxine 88 MCG tablet  Commonly known as:  SYNTHROID, LEVOTHROID  Take 88 mcg by mouth daily.     metoCLOPramide 5 MG tablet  Commonly known as:  REGLAN  Take 1 tablet (5 mg total) by mouth every 8 (eight) hours as needed for nausea (if ondansetron (ZOFRAN) ineffective.).     MULTIVITAMIN & MINERAL PO  Take 1 tablet by mouth daily.     olmesartan 20 MG tablet  Commonly known as:  BENICAR  Take 20 mg by mouth. Take one daily in pm     ondansetron 4 MG tablet  Commonly known as:  ZOFRAN  Take 1 tablet (4 mg total) by mouth every 6 (six) hours as needed for nausea.     pravastatin 80 MG tablet  Commonly known as:  PRAVACHOL  Take 80 mg by mouth every evening.     predniSONE 2.5 MG tablet  Commonly known as:  DELTASONE  Take 2.5-5 mg by mouth 2 (two) times daily with a meal. Take 2 tablets (5 mg) in the am and Take 1 tablet (2.5 mg) in the evening.     SYSTANE OP  Apply 2 drops to eye at bedtime as  needed (dry eyes). As needed     vitamin B-12 1000 MCG tablet  Commonly known as:  CYANOCOBALAMIN  Take 1,000 mcg by mouth daily.     VITAMIN  D (ERGOCALCIFEROL) PO  Take 2,000 Units by mouth daily.        Physical Exam: Filed Vitals:   07/28/15 1403  BP: 124/73  Pulse: 59  Temp: 97.7 F (36.5 C)  Resp: 18  Height: 5\' 11"  (1.803 m)  Weight: 193 lb (87.544 kg)  SpO2: 97%   Body mass index is 26.93 kg/(m^2). Physical Exam  Labs reviewed: Basic Metabolic Panel:  Recent Labs  06/30/15 0435 07/01/15 0410 07/11/15 07/24/15 2043  NA 136 138 134* 133*  K 3.8 4.4 5.0 3.9  CL 103 104  --  100*  CO2 29 28  --  27  GLUCOSE 132* 135*  --  95  BUN 21* 17 28* 19  CREATININE 1.34* 1.27* 1.3 1.60*  CALCIUM 8.2* 8.8*  --  8.8*   Liver Function Tests:  Recent Labs  06/24/15 1155 07/24/15 2043  AST 24 16  ALT 20 14*  ALKPHOS 50 52  BILITOT 0.7 0.8  PROT 6.8 5.6*  ALBUMIN 4.0 3.1*    Recent Labs  07/24/15 2043  LIPASE 43   No results for input(s): AMMONIA in the last 8760 hours. CBC:  Recent Labs  06/30/15 0435 07/01/15 0410 07/11/15 07/24/15 2043  WBC 14.7* 16.4* 9.7 10.7*  HGB 10.4* 10.4* 9.7* 10.1*  HCT 31.6* 31.4* 30* 31.8*  MCV 102.9* 100.3*  --  101.0*  PLT 195 188 377 315   Cardiac Enzymes: No results for input(s): CKTOTAL, CKMB, CKMBINDEX, TROPONINI in the last 8760 hours. BNP: Invalid input(s): POCBNP No results found for: HGBA1C No results found for: TSH No results found for: VITAMINB12 No results found for: FOLATE No results found for: IRON, TIBC, FERRITIN  Imaging and Procedures obtained prior to SNF admission: 07/24/15 abdominal xray with chest:  No evidence of active pulmonary disease. Scattered gas-filled nondistended small bowel may represent ileus or enteritis. No evidence of obstruction.  07/25/15:  CT head w/o contrast:  1. No acute intracranial pathology seen on CT. 2. Mild to moderate cortical volume loss and scattered small  vessel ischemic microangiopathy  07/25/15:  CT abdomen/pelvis with contrast:  1. No acute abnormality seen to explain the patient's symptoms. 2. Scattered diverticulosis along the ascending colon, without evidence of diverticulitis. 3. Scattered calcification along the abdominal aorta and its branches. 4. Left renal cysts noted. Status post right-sided nephrectomy. 5. Scattered coronary artery calcification noted. 6. Mild right basilar atelectasis or scarring noted. 7. Enlarged prostate seen.  Assessment/Plan 1. Nausea without vomiting -has resolved -seems this was due to poor intake leading to nausea, leading to less intake -hydrate with water--recommended at least 6 8oz glasses per day -I also think his pain meds for his knee contributed to this initially, but he is only using tylenol at this point -seems he will be able to return home by the end of the week if he continues to feel well and eat and drink normally  2. Dehydration -seems this contributed to #1 -continue adequate fluids and regular meals to prevent recurrent symptoms  3. Status post total right knee replacement -doing great as far as this goes--walking with his walker -keep f/u with orthopedics  4. 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 Elyse Hsu; also continue vitamin D to compensate for ill effects of steroids and hypogonadism on bone  5. Hypothyroidism (acquired) -cont synthroid 46mcg daily  6. Essential hypertension -bp is at goal with benicar 20mg  daily and no  longer dizzy, cont same and monitor  7. Anemia, B12 deficiency -cont b12 daily  8.  BPH:   -cont proscar, enlarged prostate noted on imaging  Functional status: independent in adls, iadls  Family/ staff Communication: discussed with rehab nurse and nurse manager  Labs/tests ordered:  No new--reviewed hospital labs which were  all ok and seemed complete  Azrielle Springsteen L. Jerimyah Vandunk, D.O. [facility] [address], [place] [address] Cell Phone (Mon-Fri 8am-5pm):  [phone] On Call:  [phone] & follow prompts after 5pm & weekends Office Phone:  [phone] Office Fax:  [phone]

## 2015-08-26 ENCOUNTER — Emergency Department (HOSPITAL_COMMUNITY): Payer: PPO

## 2015-08-26 ENCOUNTER — Emergency Department (HOSPITAL_COMMUNITY)
Admission: EM | Admit: 2015-08-26 | Discharge: 2015-08-26 | Disposition: A | Discharge status: Home / Self Care | Payer: PPO | Attending: Emergency Medicine | Admitting: Emergency Medicine

## 2015-08-26 ENCOUNTER — Encounter (HOSPITAL_COMMUNITY): Payer: Self-pay | Admitting: Emergency Medicine

## 2015-08-26 DIAGNOSIS — E559 Vitamin D deficiency, unspecified: Secondary | ICD-10-CM | POA: Diagnosis not present

## 2015-08-26 DIAGNOSIS — Z8669 Personal history of other diseases of the nervous system and sense organs: Secondary | ICD-10-CM | POA: Diagnosis not present

## 2015-08-26 DIAGNOSIS — E785 Hyperlipidemia, unspecified: Secondary | ICD-10-CM | POA: Diagnosis not present

## 2015-08-26 DIAGNOSIS — Z7982 Long term (current) use of aspirin: Secondary | ICD-10-CM | POA: Insufficient documentation

## 2015-08-26 DIAGNOSIS — S81022A Laceration with foreign body, left knee, initial encounter: Secondary | ICD-10-CM | POA: Insufficient documentation

## 2015-08-26 DIAGNOSIS — Z87442 Personal history of urinary calculi: Secondary | ICD-10-CM | POA: Insufficient documentation

## 2015-08-26 DIAGNOSIS — Y998 Other external cause status: Secondary | ICD-10-CM | POA: Insufficient documentation

## 2015-08-26 DIAGNOSIS — Z85528 Personal history of other malignant neoplasm of kidney: Secondary | ICD-10-CM | POA: Diagnosis not present

## 2015-08-26 DIAGNOSIS — D519 Vitamin B12 deficiency anemia, unspecified: Secondary | ICD-10-CM | POA: Diagnosis not present

## 2015-08-26 DIAGNOSIS — E039 Hypothyroidism, unspecified: Secondary | ICD-10-CM | POA: Insufficient documentation

## 2015-08-26 DIAGNOSIS — S81822A Laceration with foreign body, left lower leg, initial encounter: Secondary | ICD-10-CM | POA: Insufficient documentation

## 2015-08-26 DIAGNOSIS — Z87891 Personal history of nicotine dependence: Secondary | ICD-10-CM | POA: Insufficient documentation

## 2015-08-26 DIAGNOSIS — I10 Essential (primary) hypertension: Secondary | ICD-10-CM | POA: Diagnosis not present

## 2015-08-26 DIAGNOSIS — Z7952 Long term (current) use of systemic steroids: Secondary | ICD-10-CM | POA: Insufficient documentation

## 2015-08-26 DIAGNOSIS — Z85828 Personal history of other malignant neoplasm of skin: Secondary | ICD-10-CM | POA: Diagnosis not present

## 2015-08-26 DIAGNOSIS — M1990 Unspecified osteoarthritis, unspecified site: Secondary | ICD-10-CM | POA: Diagnosis not present

## 2015-08-26 DIAGNOSIS — W01198A Fall on same level from slipping, tripping and stumbling with subsequent striking against other object, initial encounter: Secondary | ICD-10-CM | POA: Insufficient documentation

## 2015-08-26 DIAGNOSIS — Z8719 Personal history of other diseases of the digestive system: Secondary | ICD-10-CM | POA: Diagnosis not present

## 2015-08-26 DIAGNOSIS — Y9289 Other specified places as the place of occurrence of the external cause: Secondary | ICD-10-CM | POA: Insufficient documentation

## 2015-08-26 DIAGNOSIS — Z87438 Personal history of other diseases of male genital organs: Secondary | ICD-10-CM | POA: Diagnosis not present

## 2015-08-26 DIAGNOSIS — Z79899 Other long term (current) drug therapy: Secondary | ICD-10-CM | POA: Diagnosis not present

## 2015-08-26 DIAGNOSIS — S8001XA Contusion of right knee, initial encounter: Secondary | ICD-10-CM | POA: Diagnosis not present

## 2015-08-26 DIAGNOSIS — Z86010 Personal history of colonic polyps: Secondary | ICD-10-CM | POA: Insufficient documentation

## 2015-08-26 DIAGNOSIS — Z8709 Personal history of other diseases of the respiratory system: Secondary | ICD-10-CM | POA: Insufficient documentation

## 2015-08-26 DIAGNOSIS — Y9389 Activity, other specified: Secondary | ICD-10-CM | POA: Insufficient documentation

## 2015-08-26 DIAGNOSIS — W19XXXA Unspecified fall, initial encounter: Secondary | ICD-10-CM

## 2015-08-26 DIAGNOSIS — S8991XA Unspecified injury of right lower leg, initial encounter: Secondary | ICD-10-CM | POA: Diagnosis present

## 2015-08-26 NOTE — ED Notes (Signed)
Pt refusing to be placed in gown, pt also refusing to allow this nurse to remove dressing on L shin. Dried blood noted to L shin dressing.

## 2015-08-26 NOTE — Discharge Instructions (Signed)
I recommend continuing to elevate and ice your right knee to help with pain and swelling. He may take Tylenol as prescribed over the counter as needed for pain relief. Also continue to change dressings daily on your skin tears. Follow-up with your primary care provider in 3-4 days. Return to the emergency department if symptoms worsen or new onset of numbness, tingling, weakness, bleeding, drainage, surrounding redness, fever.

## 2015-08-26 NOTE — ED Notes (Signed)
Per EMS: pt was at [employer] when he tripped and fell face first on tile, pt noted to have minor swelling to forehead, no LOC. Pt also noted to have warmth, reddness, edema, and bruising to right knee and had knee surgery in October. Pt also noted to have deep skin tear to left shin and abrasion to left knee. Was told by orthopedic doctor that did surgery to come to ED to have knee evaluated. nad noted. Axo x4.

## 2015-08-26 NOTE — ED Notes (Signed)
Pt refused Ice pack

## 2015-08-26 NOTE — ED Provider Notes (Signed)
CSN: [id number]     Arrival date & time 08/26/15  1700 History   First MD Initiated Contact with Patient 08/26/15 1803     Chief Complaint  Patient presents with  . Fall  . Knee Pain     (Consider location/radiation/quality/duration/timing/severity/associated sxs/prior Treatment) HPI  Patient is a 81 year old male past medical history of hypertension who presents to the ED status post fall, onset PTA. Patient reports he was at [facility] when he tripped on a cart that was behind him resulting in him falling on his hands and knees and hitting his forehead on the ground. Denies LOC. Patient endorses having mild pain his right knee. Reports having skin tear/abrasions to his left knee and shin. Patient denies being on any blood thinners. Denies headache, visual changes, lightheadedness, dizziness, chest pain, shortness of breath, abdominal pain, nausea, vomiting, back/neck pain, numbness, tingling, weakness. 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 He notes he recently had a right knee replacement (06/29/15) and going to see his orthopedic surgeon however due to his physician not having clinic today he was advised to come to the ED for evaluation. Denies use of anticoagulants. Tetanus up-to-date.  Past Medical History  Diagnosis Date  . Hypertension   . Arthritis     Status post left total replacement 8 2011  . Pituitary mass ([place]) 2000    S/p transphenoidal excision 05/1999 ([facility])  . 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 Hypothyroidism (acquired) 2000  . Anemia, B12 deficiency 2000  . Hyperlipidemia 2003  . Vitamin D deficiency 2009  . History of renal cell carcinoma 1994    Status post right nephrectomy  . BPH (benign prostatic hyperplasia) 2013  . Glaucoma 2007    Status post left trabeculectomy 2007  . History of colon polyps      Colonoscopy 2001  . Allergic rhinitis     Prior allergy shots 20 years  . Colitis 2014  . Anemia   . Cancer ([place])     renal cell ca and skin cancer   . Nocturia   . History of kidney stones   . Difficult intubation 1994    surgery had to be  stopped due to injury to "throat"  . Difficult intubation 1994    no trouble since.  Required nasotracheal intubation  '02 [facility] / ANESTHESIA RECORD FROM 2013 AND 2016 IN EPIC   Past Surgical History  Procedure Laterality Date  . Tonsillectomy    . Eye surgery  over last 6 yrs.      trabeculectomy...   . Eye surgery   cat ext ou  . Brain surgery  2000    pituatary gland removed .  Marland Kitchen Joint replacement  2011    knee left  . Cholecystectomy  1984  . Reverse shoulder arthroplasty  12/15/2011    Procedure: REVERSE SHOULDER ARTHROPLASTY;  Surgeon: Marin Shutter, MD;  Location: [facility];  Service: Orthopedics;  Laterality: Right;  right total reverse shoulder  . Nephrectomy Right 1994    Renal cell  . Transsphenoidal excision pituitary tumor  05/1999    A.Tommi Rumps, M.D.([facility])  . Ectropion surgery Bilateral 2006  . Mohs procedure       for skin cancer on nose   . Knee arthroscopy Right 02/06/2015    Procedure: RIGHT ARTHROSCOPY KNEE WITH LATERAL MENSICAL  DEBRIDEMENT;  Surgeon: Gaynelle Arabian,  MD;  [facility];  Service: Orthopedics;  Laterality: Right;  . Total knee arthroplasty Right 06/29/2015    Procedure: TOTAL KNEE ARTHROPLASTY;  Surgeon: Gaynelle Arabian, MD;  [facility];  Service: Orthopedics;  Laterality: Right;   Family History  Problem Relation Age of Onset  . Anesthesia problems Neg Hx   . Hypotension Neg Hx   . Malignant hyperthermia Neg Hx   . Pseudochol deficiency Neg Hx   . Lung cancer Father    Social History  Substance Use Topics  . Smoking status: Former Smoker    Types: Cigarettes, Pipe    Quit date: 09/26/1988  . Smokeless tobacco: Never Used  . Alcohol Use: Yes     Comment: 1.5-2 ounces of vodka nightly      Review of Systems  Musculoskeletal: Positive for joint swelling and arthralgias.      Allergies  Percocet and Diovan  Home Medications   Prior to Admission medications   Medication Sig Start Date End Date Taking? Authorizing Provider  aspirin EC 81 MG tablet Take 81 mg by mouth daily.   Yes Historical Provider, MD  co-enzyme Q-10 30 MG capsule Take 30 mg by mouth daily.   Yes Historical Provider, MD  docusate sodium (COLACE) 100 MG capsule Take 100 mg by mouth at bedtime.    Yes Historical Provider, MD  finasteride (PROSCAR) 5 MG tablet Take 5 mg by mouth every morning.    Yes Historical Provider, MD  levothyroxine (SYNTHROID, LEVOTHROID) 88 MCG tablet Take 88 mcg by mouth daily.   Yes Historical Provider, MD  Multiple Vitamins-Minerals (MULTIVITAMIN & MINERAL PO) Take 1 tablet by mouth daily.   Yes Historical Provider, MD  pravastatin (PRAVACHOL) 80 MG tablet Take 80 mg by mouth every evening.    Yes Historical Provider, MD  predniSONE (DELTASONE) 2.5 MG tablet Take 2.5-5 mg by mouth 2 (two) times daily with a meal. Take 2 tablets (5 mg) in the am and Take 1 tablet (2.5 mg) in the evening.   Yes Historical Provider, MD  Testosterone Cypionate (DEPO-TESTOSTERONE IM) Inject into the muscle. Patient states he takes .15mg  every week per patient   Yes Historical Provider, MD  vitamin B-12 (CYANOCOBALAMIN) 1000 MCG tablet Take 1,000 mcg by mouth daily.   Yes Historical Provider, MD  VITAMIN D, ERGOCALCIFEROL, PO Take 2,000 Units by mouth daily.    Yes Historical Provider, MD  bisacodyl (DULCOLAX) 10 MG suppository Place 1 suppository (10 mg total) rectally daily as needed for moderate constipation. Patient not taking: Reported on 08/26/2015 07/01/15   Arlee Muslim, PA-C  metoCLOPramide (REGLAN) 5 MG tablet Take 1 tablet (5 mg total) by mouth every 8 (eight) hours as needed for nausea (if ondansetron (ZOFRAN) ineffective.). Patient not taking: Reported on 08/26/2015 07/01/15   Arlee Muslim,  PA-C  ondansetron (ZOFRAN) 4 MG tablet Take 1 tablet (4 mg total) by mouth every 6 (six) hours as needed for nausea. Patient not taking: Reported on 08/26/2015 07/01/15   Arlee Muslim, PA-C   BP 142/75 mmHg  Pulse 73  Temp(Src) 98.6 F (37 C) (Oral)  Resp 16  SpO2 100% Physical Exam  Constitutional: He is oriented to person, place, and time. He appears well-developed and well-nourished. No distress.  HENT:  Head: Normocephalic and atraumatic. Head is without raccoon's eyes, without Battle's sign, without abrasion, without contusion and without laceration.  Right Ear: Tympanic membrane normal. No hemotympanum.  Left Ear: Tympanic membrane normal. No hemotympanum.  Nose: Nose normal. No  nasal septal hematoma. Right sinus exhibits no maxillary sinus tenderness and no frontal sinus tenderness. Left sinus exhibits no maxillary sinus tenderness and no frontal sinus tenderness.  Mouth/Throat: Uvula is midline, oropharynx is clear and moist and mucous membranes are normal. No oropharyngeal exudate.  Eyes: Conjunctivae and EOM are normal. Pupils are equal, round, and reactive to light. Right eye exhibits no discharge. Left eye exhibits no discharge. No scleral icterus.  Neck: Normal range of motion. Neck supple.  Cardiovascular: Normal rate, regular rhythm, normal heart sounds and intact distal pulses.   Pulmonary/Chest: Effort normal and breath sounds normal. No respiratory distress. He has no wheezes. He has no rales. He exhibits no tenderness.  Abdominal: Soft. Bowel sounds are normal. He exhibits no distension and no mass. There is no tenderness. There is no rebound and no guarding.  Musculoskeletal: He exhibits tenderness.       Right knee: He exhibits decreased range of motion (due to swelling), swelling and ecchymosis. He exhibits no effusion, no deformity, no laceration, no erythema, normal alignment, no LCL laxity, normal patellar mobility and no MCL laxity.  Moderate swelling and ecchymosis  noted to right superior/medial knee/distal thigh. Dec. ROM of right knee due to swelling. Sensation intact. 2+ DP pulses. Cap refill <2. 5/5 strength BLE. Left anterior shin TTP.  No C/T/L midline tenderness. FROM of back and neck.   FROM of BUE. 5/5 strength. Sensation intact   Lymphadenopathy:    He has no cervical adenopathy.  Neurological: He is alert and oriented to person, place, and time. He has normal strength. No cranial nerve deficit or sensory deficit. Coordination normal.  Skin: Skin is warm and dry.  1cm superficial skin abrasion noted to left anterior knee, no active bleeding.   5cm skin tear noted to left anterior shin, no active bleeding.  Nursing note and vitals reviewed.   ED Course  Procedures (including critical care time) Labs Review Labs Reviewed - No data to display  Imaging Review Dg Tibia/fibula Left  08/26/2015  CLINICAL DATA:  81 year old male with left knee arthroplasty status post fall. EXAM: LEFT TIBIA AND FIBULA - 2 VIEW COMPARISON:  Right knee arthroplasty dated 08/26/2015 FINDINGS: There is a left knee arthroplasty with anatomic alignment. The bones are osteopenic. No acute fracture identified. There is no dislocation. The soft tissues are grossly unremarkable. IMPRESSION: Left knee arthroplasty.  No acute fracture or dislocation. Electronically Signed   By: Anner Crete M.D.   On: 08/26/2015 20:29   Ct Head Wo Contrast  08/26/2015  CLINICAL DATA:  Patient status post fall. Unsure if hit head. No reported loss consciousness. EXAM: CT HEAD WITHOUT CONTRAST TECHNIQUE: Contiguous axial images were obtained from the base of the skull through the vertex without intravenous contrast. COMPARISON:  Brain CT 07/25/2015 FINDINGS: Ventricles and sulci are prominent compatible with atrophy. Periventricular and subcortical white matter hypodensity compatible with chronic small vessel ischemic changes. No evidence for acute cortically based infarct, intracranial  hemorrhage, mass lesion mass-effect. Scalp soft tissues are unremarkable. Calvarium is intact. Mastoid air cells are unremarkable. Paranasal sinuses are unremarkable. IMPRESSION: No acute intracranial process. Chronic small vessel ischemic changes and cortical volume loss. Electronically Signed   By: Lovey Newcomer M.D.   On: 08/26/2015 20:18   Dg Knee Complete 4 Views Right  08/26/2015  CLINICAL DATA:  81 year old male with history of right knee replacement. Status post fall with knee bruising. EXAM: RIGHT KNEE - COMPLETE 4+ VIEW COMPARISON:  None. FINDINGS: There is a  total right knee arthroplasty. The femoral and tibial components of the arthroplasty appear in anatomic alignment. There is no acute fracture or dislocation. The bones are osteopenic. The joint spaces are well maintained. There is soft tissue swelling of the knee. A small suprapatellar effusion may be present. IMPRESSION: Total right knee arthroplasty.  No acute fracture or dislocation. Electronically Signed   By: Anner Crete M.D.   On: 08/26/2015 20:27   I have personally reviewed and evaluated these images and lab results as part of my medical decision-making.   EKG Interpretation None      MDM   Final diagnoses:  Fall, initial encounter    Patient presents status post fall with reported swelling and bruising to his right upper knee/thigh and skin tears/abrasions to his left knee and shin. Endorses hitting his head on the fall, denies LOC. Denies use of anticoagulants. VSS. Exam revealed moderate swelling and ecchymosis to right medial superior knee/thigh, mildly decreased range of motion of right knee due to swelling, small abrasion noted to left anterior knee and large skin tear noted to left shin, no active bleeding, bilateral lower extremities otherwise neurovascularly intact. No neuro deficits. Right knee x-ray and left tib-fib x-ray revealed no acute abnormalities. CT head revealed no acute process. Discussed results and  plan for discharge with patient. 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 Discussed wound care with patient.  Evaluation does not show pathology requring ongoing emergent intervention or admission. Pt is hemodynamically stable and mentating appropriately. Discussed findings/results and plan with patient/guardian, who agrees with plan. All questions answered. Return precautions discussed and outpatient follow up given.      Chesley Noon [place], [place] 08/26/15 2044  Ripley Fraise, MD 08/26/15 2122

## 2015-08-30 DIAGNOSIS — R609 Edema, unspecified: Secondary | ICD-10-CM | POA: Diagnosis not present

## 2015-09-01 DIAGNOSIS — E559 Vitamin D deficiency, unspecified: Secondary | ICD-10-CM | POA: Diagnosis not present

## 2015-09-01 DIAGNOSIS — E230 Hypopituitarism: Secondary | ICD-10-CM | POA: Diagnosis not present

## 2015-09-01 DIAGNOSIS — D510 Vitamin B12 deficiency anemia due to intrinsic factor deficiency: Secondary | ICD-10-CM | POA: Diagnosis not present

## 2015-09-01 DIAGNOSIS — E7800 Pure hypercholesterolemia, unspecified: Secondary | ICD-10-CM | POA: Diagnosis not present

## 2015-09-01 DIAGNOSIS — I10 Essential (primary) hypertension: Secondary | ICD-10-CM | POA: Diagnosis not present

## 2015-09-02 DIAGNOSIS — W1839XA Other fall on same level, initial encounter: Secondary | ICD-10-CM | POA: Diagnosis not present

## 2015-09-02 DIAGNOSIS — R278 Other lack of coordination: Secondary | ICD-10-CM | POA: Diagnosis not present

## 2015-09-02 DIAGNOSIS — R601 Generalized edema: Secondary | ICD-10-CM | POA: Diagnosis not present

## 2015-09-02 DIAGNOSIS — T8453XA Infection and inflammatory reaction due to internal right knee prosthesis, initial encounter: Secondary | ICD-10-CM | POA: Diagnosis not present

## 2015-09-02 DIAGNOSIS — M6281 Muscle weakness (generalized): Secondary | ICD-10-CM | POA: Diagnosis not present

## 2015-09-03 DIAGNOSIS — T8453XA Infection and inflammatory reaction due to internal right knee prosthesis, initial encounter: Secondary | ICD-10-CM | POA: Diagnosis not present

## 2015-09-03 DIAGNOSIS — R278 Other lack of coordination: Secondary | ICD-10-CM | POA: Diagnosis not present

## 2015-09-03 DIAGNOSIS — R601 Generalized edema: Secondary | ICD-10-CM | POA: Diagnosis not present

## 2015-09-03 DIAGNOSIS — M6281 Muscle weakness (generalized): Secondary | ICD-10-CM | POA: Diagnosis not present

## 2015-09-03 DIAGNOSIS — W1839XA Other fall on same level, initial encounter: Secondary | ICD-10-CM | POA: Diagnosis not present

## 2015-09-07 ENCOUNTER — Encounter (HOSPITAL_BASED_OUTPATIENT_CLINIC_OR_DEPARTMENT_OTHER): Payer: PPO | Attending: Internal Medicine

## 2015-09-07 DIAGNOSIS — L97821 Non-pressure chronic ulcer of other part of left lower leg limited to breakdown of skin: Secondary | ICD-10-CM | POA: Diagnosis not present

## 2015-09-07 DIAGNOSIS — S71111A Laceration without foreign body, right thigh, initial encounter: Secondary | ICD-10-CM | POA: Diagnosis not present

## 2015-09-07 DIAGNOSIS — I872 Venous insufficiency (chronic) (peripheral): Secondary | ICD-10-CM | POA: Diagnosis not present

## 2015-09-07 DIAGNOSIS — S81812A Laceration without foreign body, left lower leg, initial encounter: Secondary | ICD-10-CM | POA: Diagnosis not present

## 2015-09-07 DIAGNOSIS — S81012A Laceration without foreign body, left knee, initial encounter: Secondary | ICD-10-CM | POA: Diagnosis not present

## 2015-09-07 DIAGNOSIS — X35XXXA Volcanic eruption, initial encounter: Secondary | ICD-10-CM | POA: Diagnosis not present

## 2015-09-07 DIAGNOSIS — Z7982 Long term (current) use of aspirin: Secondary | ICD-10-CM | POA: Insufficient documentation

## 2015-09-07 DIAGNOSIS — W19XXXA Unspecified fall, initial encounter: Secondary | ICD-10-CM | POA: Diagnosis not present

## 2015-09-07 DIAGNOSIS — S7011XA Contusion of right thigh, initial encounter: Secondary | ICD-10-CM | POA: Insufficient documentation

## 2015-09-07 DIAGNOSIS — H409 Unspecified glaucoma: Secondary | ICD-10-CM | POA: Insufficient documentation

## 2015-09-07 DIAGNOSIS — G629 Polyneuropathy, unspecified: Secondary | ICD-10-CM | POA: Insufficient documentation

## 2015-09-07 DIAGNOSIS — I10 Essential (primary) hypertension: Secondary | ICD-10-CM | POA: Insufficient documentation

## 2015-09-07 DIAGNOSIS — I87332 Chronic venous hypertension (idiopathic) with ulcer and inflammation of left lower extremity: Secondary | ICD-10-CM | POA: Insufficient documentation

## 2015-09-07 DIAGNOSIS — I7300 Raynaud's syndrome without gangrene: Secondary | ICD-10-CM | POA: Insufficient documentation

## 2015-09-14 DIAGNOSIS — X35XXXA Volcanic eruption, initial encounter: Secondary | ICD-10-CM | POA: Diagnosis not present

## 2015-09-14 DIAGNOSIS — L97822 Non-pressure chronic ulcer of other part of left lower leg with fat layer exposed: Secondary | ICD-10-CM | POA: Diagnosis not present

## 2015-09-14 DIAGNOSIS — I87332 Chronic venous hypertension (idiopathic) with ulcer and inflammation of left lower extremity: Secondary | ICD-10-CM | POA: Diagnosis not present

## 2015-09-14 DIAGNOSIS — S81812A Laceration without foreign body, left lower leg, initial encounter: Secondary | ICD-10-CM | POA: Diagnosis not present

## 2015-09-15 DIAGNOSIS — L89892 Pressure ulcer of other site, stage 2: Secondary | ICD-10-CM | POA: Diagnosis not present

## 2015-09-15 DIAGNOSIS — B351 Tinea unguium: Secondary | ICD-10-CM | POA: Diagnosis not present

## 2015-09-15 DIAGNOSIS — M79672 Pain in left foot: Secondary | ICD-10-CM | POA: Diagnosis not present

## 2015-09-15 DIAGNOSIS — M79671 Pain in right foot: Secondary | ICD-10-CM | POA: Diagnosis not present

## 2015-09-15 DIAGNOSIS — L84 Corns and callosities: Secondary | ICD-10-CM | POA: Diagnosis not present

## 2015-09-21 DIAGNOSIS — S81812A Laceration without foreign body, left lower leg, initial encounter: Secondary | ICD-10-CM | POA: Diagnosis not present

## 2015-09-21 DIAGNOSIS — I87332 Chronic venous hypertension (idiopathic) with ulcer and inflammation of left lower extremity: Secondary | ICD-10-CM | POA: Diagnosis not present

## 2015-09-21 DIAGNOSIS — X35XXXA Volcanic eruption, initial encounter: Secondary | ICD-10-CM | POA: Diagnosis not present

## 2015-09-28 DIAGNOSIS — I87332 Chronic venous hypertension (idiopathic) with ulcer and inflammation of left lower extremity: Secondary | ICD-10-CM | POA: Diagnosis not present

## 2015-09-28 DIAGNOSIS — S81812A Laceration without foreign body, left lower leg, initial encounter: Secondary | ICD-10-CM | POA: Diagnosis not present

## 2015-09-29 DIAGNOSIS — L97909 Non-pressure chronic ulcer of unspecified part of unspecified lower leg with unspecified severity: Secondary | ICD-10-CM | POA: Diagnosis not present

## 2015-10-05 ENCOUNTER — Encounter (HOSPITAL_BASED_OUTPATIENT_CLINIC_OR_DEPARTMENT_OTHER): Payer: PPO | Attending: Internal Medicine

## 2015-10-05 DIAGNOSIS — E559 Vitamin D deficiency, unspecified: Secondary | ICD-10-CM | POA: Diagnosis not present

## 2015-10-05 DIAGNOSIS — I87332 Chronic venous hypertension (idiopathic) with ulcer and inflammation of left lower extremity: Secondary | ICD-10-CM | POA: Insufficient documentation

## 2015-10-05 DIAGNOSIS — I10 Essential (primary) hypertension: Secondary | ICD-10-CM | POA: Diagnosis not present

## 2015-10-05 DIAGNOSIS — Y9289 Other specified places as the place of occurrence of the external cause: Secondary | ICD-10-CM | POA: Diagnosis not present

## 2015-10-05 DIAGNOSIS — G629 Polyneuropathy, unspecified: Secondary | ICD-10-CM | POA: Insufficient documentation

## 2015-10-05 DIAGNOSIS — H409 Unspecified glaucoma: Secondary | ICD-10-CM | POA: Insufficient documentation

## 2015-10-05 DIAGNOSIS — E230 Hypopituitarism: Secondary | ICD-10-CM | POA: Diagnosis not present

## 2015-10-05 DIAGNOSIS — S7012XA Contusion of left thigh, initial encounter: Secondary | ICD-10-CM | POA: Insufficient documentation

## 2015-10-05 DIAGNOSIS — W19XXXA Unspecified fall, initial encounter: Secondary | ICD-10-CM | POA: Insufficient documentation

## 2015-10-05 DIAGNOSIS — L97822 Non-pressure chronic ulcer of other part of left lower leg with fat layer exposed: Secondary | ICD-10-CM | POA: Insufficient documentation

## 2015-10-05 DIAGNOSIS — S81812A Laceration without foreign body, left lower leg, initial encounter: Secondary | ICD-10-CM | POA: Diagnosis not present

## 2015-10-05 DIAGNOSIS — E7800 Pure hypercholesterolemia, unspecified: Secondary | ICD-10-CM | POA: Diagnosis not present

## 2015-10-05 DIAGNOSIS — D510 Vitamin B12 deficiency anemia due to intrinsic factor deficiency: Secondary | ICD-10-CM | POA: Diagnosis not present

## 2015-10-08 DIAGNOSIS — D510 Vitamin B12 deficiency anemia due to intrinsic factor deficiency: Secondary | ICD-10-CM | POA: Diagnosis not present

## 2015-10-08 DIAGNOSIS — E559 Vitamin D deficiency, unspecified: Secondary | ICD-10-CM | POA: Diagnosis not present

## 2015-10-08 DIAGNOSIS — E7800 Pure hypercholesterolemia, unspecified: Secondary | ICD-10-CM | POA: Diagnosis not present

## 2015-10-08 DIAGNOSIS — E230 Hypopituitarism: Secondary | ICD-10-CM | POA: Diagnosis not present

## 2015-10-08 DIAGNOSIS — I10 Essential (primary) hypertension: Secondary | ICD-10-CM | POA: Diagnosis not present

## 2015-10-12 DIAGNOSIS — S81812A Laceration without foreign body, left lower leg, initial encounter: Secondary | ICD-10-CM | POA: Diagnosis not present

## 2015-10-12 DIAGNOSIS — L97221 Non-pressure chronic ulcer of left calf limited to breakdown of skin: Secondary | ICD-10-CM | POA: Diagnosis not present

## 2015-10-12 DIAGNOSIS — I87332 Chronic venous hypertension (idiopathic) with ulcer and inflammation of left lower extremity: Secondary | ICD-10-CM | POA: Diagnosis not present

## 2015-10-19 DIAGNOSIS — I87332 Chronic venous hypertension (idiopathic) with ulcer and inflammation of left lower extremity: Secondary | ICD-10-CM | POA: Diagnosis not present

## 2015-10-20 ENCOUNTER — Inpatient Hospital Stay (HOSPITAL_COMMUNITY)
Admission: EM | Admit: 2015-10-20 | Discharge: 2015-10-24 | DRG: 872 | Disposition: A | Discharge status: Skilled Nursing Facility | Payer: PPO | Attending: Internal Medicine | Admitting: Internal Medicine

## 2015-10-20 ENCOUNTER — Emergency Department (HOSPITAL_COMMUNITY): Payer: PPO

## 2015-10-20 ENCOUNTER — Encounter (HOSPITAL_COMMUNITY): Payer: Self-pay | Admitting: Emergency Medicine

## 2015-10-20 DIAGNOSIS — I10 Essential (primary) hypertension: Secondary | ICD-10-CM | POA: Diagnosis present

## 2015-10-20 DIAGNOSIS — Z888 Allergy status to other drugs, medicaments and biological substances status: Secondary | ICD-10-CM

## 2015-10-20 DIAGNOSIS — Z87442 Personal history of urinary calculi: Secondary | ICD-10-CM

## 2015-10-20 DIAGNOSIS — Z905 Acquired absence of kidney: Secondary | ICD-10-CM | POA: Diagnosis not present

## 2015-10-20 DIAGNOSIS — Z79899 Other long term (current) drug therapy: Secondary | ICD-10-CM | POA: Diagnosis not present

## 2015-10-20 DIAGNOSIS — E538 Deficiency of other specified B group vitamins: Secondary | ICD-10-CM | POA: Diagnosis not present

## 2015-10-20 DIAGNOSIS — E039 Hypothyroidism, unspecified: Secondary | ICD-10-CM | POA: Diagnosis not present

## 2015-10-20 DIAGNOSIS — H409 Unspecified glaucoma: Secondary | ICD-10-CM | POA: Diagnosis present

## 2015-10-20 DIAGNOSIS — E230 Hypopituitarism: Secondary | ICD-10-CM | POA: Diagnosis not present

## 2015-10-20 DIAGNOSIS — D649 Anemia, unspecified: Secondary | ICD-10-CM | POA: Diagnosis present

## 2015-10-20 DIAGNOSIS — A419 Sepsis, unspecified organism: Secondary | ICD-10-CM | POA: Diagnosis not present

## 2015-10-20 DIAGNOSIS — R031 Nonspecific low blood-pressure reading: Secondary | ICD-10-CM | POA: Diagnosis not present

## 2015-10-20 DIAGNOSIS — Z87891 Personal history of nicotine dependence: Secondary | ICD-10-CM | POA: Diagnosis not present

## 2015-10-20 DIAGNOSIS — D538 Other specified nutritional anemias: Secondary | ICD-10-CM | POA: Diagnosis not present

## 2015-10-20 DIAGNOSIS — N400 Enlarged prostate without lower urinary tract symptoms: Secondary | ICD-10-CM | POA: Diagnosis not present

## 2015-10-20 DIAGNOSIS — Z85528 Personal history of other malignant neoplasm of kidney: Secondary | ICD-10-CM | POA: Diagnosis not present

## 2015-10-20 DIAGNOSIS — I959 Hypotension, unspecified: Secondary | ICD-10-CM | POA: Diagnosis present

## 2015-10-20 DIAGNOSIS — Z66 Do not resuscitate: Secondary | ICD-10-CM | POA: Diagnosis present

## 2015-10-20 DIAGNOSIS — Z85828 Personal history of other malignant neoplasm of skin: Secondary | ICD-10-CM

## 2015-10-20 DIAGNOSIS — M1990 Unspecified osteoarthritis, unspecified site: Secondary | ICD-10-CM | POA: Diagnosis present

## 2015-10-20 DIAGNOSIS — E871 Hypo-osmolality and hyponatremia: Secondary | ICD-10-CM | POA: Diagnosis present

## 2015-10-20 DIAGNOSIS — Z801 Family history of malignant neoplasm of trachea, bronchus and lung: Secondary | ICD-10-CM

## 2015-10-20 DIAGNOSIS — E559 Vitamin D deficiency, unspecified: Secondary | ICD-10-CM | POA: Diagnosis present

## 2015-10-20 DIAGNOSIS — Z9049 Acquired absence of other specified parts of digestive tract: Secondary | ICD-10-CM | POA: Diagnosis not present

## 2015-10-20 DIAGNOSIS — L03115 Cellulitis of right lower limb: Secondary | ICD-10-CM | POA: Diagnosis present

## 2015-10-20 DIAGNOSIS — D519 Vitamin B12 deficiency anemia, unspecified: Secondary | ICD-10-CM | POA: Diagnosis not present

## 2015-10-20 DIAGNOSIS — Z96611 Presence of right artificial shoulder joint: Secondary | ICD-10-CM | POA: Diagnosis not present

## 2015-10-20 DIAGNOSIS — Z885 Allergy status to narcotic agent status: Secondary | ICD-10-CM | POA: Diagnosis not present

## 2015-10-20 DIAGNOSIS — E860 Dehydration: Secondary | ICD-10-CM | POA: Diagnosis not present

## 2015-10-20 DIAGNOSIS — Z7952 Long term (current) use of systemic steroids: Secondary | ICD-10-CM

## 2015-10-20 DIAGNOSIS — Z86010 Personal history of colonic polyps: Secondary | ICD-10-CM | POA: Diagnosis not present

## 2015-10-20 DIAGNOSIS — R509 Fever, unspecified: Secondary | ICD-10-CM

## 2015-10-20 DIAGNOSIS — E785 Hyperlipidemia, unspecified: Secondary | ICD-10-CM | POA: Diagnosis not present

## 2015-10-20 LAB — CBC WITH DIFFERENTIAL/PLATELET
BASOS ABS: 0 10*3/uL (ref 0.0–0.1)
Basophils Relative: 0 %
EOS ABS: 0.2 10*3/uL (ref 0.0–0.7)
EOS PCT: 2 %
HCT: 32.7 % — ABNORMAL LOW (ref 39.0–52.0)
Hemoglobin: 10.3 g/dL — ABNORMAL LOW (ref 13.0–17.0)
LYMPHS PCT: 29 %
Lymphs Abs: 3 10*3/uL (ref 0.7–4.0)
MCH: 30.3 pg (ref 26.0–34.0)
MCHC: 31.5 g/dL (ref 30.0–36.0)
MCV: 96.2 fL (ref 78.0–100.0)
Monocytes Absolute: 1.2 10*3/uL — ABNORMAL HIGH (ref 0.1–1.0)
Monocytes Relative: 11 %
NEUTROS PCT: 58 %
Neutro Abs: 5.8 10*3/uL (ref 1.7–7.7)
PLATELETS: 249 10*3/uL (ref 150–400)
RBC: 3.4 MIL/uL — AB (ref 4.22–5.81)
RDW: 15.6 % — ABNORMAL HIGH (ref 11.5–15.5)
WBC: 10.1 10*3/uL (ref 4.0–10.5)

## 2015-10-20 LAB — URINALYSIS, ROUTINE W REFLEX MICROSCOPIC
Bilirubin Urine: NEGATIVE
Glucose, UA: NEGATIVE mg/dL
Hgb urine dipstick: NEGATIVE
Ketones, ur: 15 mg/dL — AB
LEUKOCYTES UA: NEGATIVE
Nitrite: NEGATIVE
PROTEIN: NEGATIVE mg/dL
SPECIFIC GRAVITY, URINE: 1.013 (ref 1.005–1.030)
pH: 5.5 (ref 5.0–8.0)

## 2015-10-20 LAB — COMPREHENSIVE METABOLIC PANEL
ALT: 11 U/L — ABNORMAL LOW (ref 17–63)
AST: 14 U/L — AB (ref 15–41)
Albumin: 2.8 g/dL — ABNORMAL LOW (ref 3.5–5.0)
Alkaline Phosphatase: 40 U/L (ref 38–126)
Anion gap: 8 (ref 5–15)
BILIRUBIN TOTAL: 1 mg/dL (ref 0.3–1.2)
BUN: 14 mg/dL (ref 6–20)
CHLORIDE: 96 mmol/L — AB (ref 101–111)
CO2: 25 mmol/L (ref 22–32)
Calcium: 8.4 mg/dL — ABNORMAL LOW (ref 8.9–10.3)
Creatinine, Ser: 1.28 mg/dL — ABNORMAL HIGH (ref 0.61–1.24)
GFR, EST AFRICAN AMERICAN: 55 mL/min — AB (ref >60)
GFR, EST NON AFRICAN AMERICAN: 48 mL/min — AB (ref >60)
Glucose, Bld: 84 mg/dL (ref 65–99)
POTASSIUM: 3.7 mmol/L (ref 3.5–5.1)
Sodium: 129 mmol/L — ABNORMAL LOW (ref 135–145)
Total Protein: 5.2 g/dL — ABNORMAL LOW (ref 6.5–8.1)

## 2015-10-20 LAB — I-STAT CG4 LACTIC ACID, ED: LACTIC ACID, VENOUS: 0.91 mmol/L (ref 0.5–2.0)

## 2015-10-20 MED ORDER — VANCOMYCIN HCL IN DEXTROSE 1-5 GM/200ML-% IV SOLN
1000.0000 mg | Freq: Once | INTRAVENOUS | Status: AC
  Administered 2015-10-20: 1000 mg via INTRAVENOUS
  Filled 2015-10-20: qty 200

## 2015-10-20 MED ORDER — PIPERACILLIN-TAZOBACTAM 3.375 G IVPB
3.3750 g | Freq: Three times a day (TID) | INTRAVENOUS | Status: DC
  Administered 2015-10-21 – 2015-10-23 (×7): 3.375 g via INTRAVENOUS
  Filled 2015-10-20 (×8): qty 50

## 2015-10-20 MED ORDER — PIPERACILLIN-TAZOBACTAM 3.375 G IVPB 30 MIN
3.3750 g | Freq: Once | INTRAVENOUS | Status: AC
  Administered 2015-10-20: 3.375 g via INTRAVENOUS
  Filled 2015-10-20: qty 50

## 2015-10-20 MED ORDER — SODIUM CHLORIDE 0.9 % IV BOLUS (SEPSIS)
1000.0000 mL | INTRAVENOUS | Status: AC
  Administered 2015-10-20 (×3): 1000 mL via INTRAVENOUS

## 2015-10-20 MED ORDER — VANCOMYCIN HCL 10 G IV SOLR
1250.0000 mg | INTRAVENOUS | Status: DC
  Administered 2015-10-21 – 2015-10-22 (×2): 1250 mg via INTRAVENOUS
  Filled 2015-10-20 (×2): qty 1250

## 2015-10-20 NOTE — Progress Notes (Signed)
Rx Brief note:  Zosyn/Vancomycin See 2/21 note by C. Shade for details  Assessment:  Scr=1.28>CrCl~39 (N)  Used wt from 11/16, f/u new wt when entered into EPIC  Plan:  Zosyn 3.375 Gm IV q8h EI  Vancomycin 1Gm x1 then 1250mg  IV q24h  Thanks [name] 10/20/2015 10:13 PM

## 2015-10-20 NOTE — ED Notes (Signed)
Pt BIB EMS for complaint of fever; fever was 101.8 according to nursing facility; pt received 1000mg  tylenol before EMS arrival; pt feels weak and sluggish as of last night; pt nauseas without vomiting or diarrhea; BP at arrival of EMS was 88/48; pt received 543ml of fluids; CBG 121; pt oriented but keeps dozing off

## 2015-10-20 NOTE — ED Notes (Signed)
Antibiotics started before second set of cultures were drawn

## 2015-10-20 NOTE — Progress Notes (Signed)
Pharmacy Antibiotic Note  [name] [name]. is a 82 y.o. male admitted on 10/20/2015 with sepsis, fever, hypotension, unknown source of infection.  Pharmacy has been consulted for Vancomycin and Zosyn dosing.  First doses have been ordered in ED.  Labs, CXR, and cultures pending collection.  Plan:  Zosyn 3.375g IV STAT over 30 min, then further doses based on renal function.  Vancomycin 1g IV stat, then further doses based on renal function.   Measure Vanc trough at steady state.  Goal trough 15-20.  Follow up renal fxn, culture results, and clinical course.  Weight: last weight 87.5 kg (07/28/15).  Current weight pending.    Temp (24hrs), Avg:98.5 F (36.9 C), Min:98.5 F (36.9 C), Max:98.5 F (36.9 C)  No results for input(s): WBC, CREATININE, LATICACIDVEN, VANCOTROUGH, VANCOPEAK, VANCORANDOM, GENTTROUGH, GENTPEAK, GENTRANDOM, TOBRATROUGH, TOBRAPEAK, TOBRARND, AMIKACINPEAK, AMIKACINTROU, AMIKACIN in the last 168 hours.  CrCl cannot be calculated (Unknown ideal weight.).    Allergies  Allergen Reactions  . Percocet [Oxycodone-Acetaminophen] Other (See Comments)    Just doesn't like it  . Diovan [Valsartan] Rash    Antimicrobials this admission: Zosyn 2/21 >>  Vancomycin 2/21 >>   Dose adjustments this admission: N/A  Microbiology results: 2/21 BCx: ordered 2/21 UCx: ordered   Thank you for allowing pharmacy to be a part of this patient's care.  Gretta Arab PharmD, BCPS Pager [phone] 10/20/2015 8:50 PM

## 2015-10-20 NOTE — ED Provider Notes (Addendum)
CSN: [id number]     Arrival date & time 10/20/15  1904 History   First MD Initiated Contact with Patient 10/20/15 2019     Chief Complaint  Patient presents with  . Hypotension   History is obtained from patient and from Macie Burows, nurse at [facility] telephone  (Consider location/radiation/quality/duration/timing/severity/associated sxs/prior Treatment) HPI He complained of generalized weakness for the past 2 days. He was seen by Ms. Howard earlier today and determined to have temperature 100.8. EMS was called. EMS found patient had blood pressure of 80/40. He complained of lightheadedness with standing. He was treated by EMS with 500 mL of normal saline en route. He also received Tylenol immediately prior to coming here He presently declines pain anywhere he is asymptomatic other than complaint of generalized weakness no other associated symptoms Past Medical History  Diagnosis Date  . Hypertension   . Arthritis     Status post left total replacement 8 2011  . Pituitary mass ([place]) 2000    S/p transphenoidal excision 05/1999 ([facility])  . 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 Hypothyroidism (acquired) 2000  . Anemia, B12 deficiency 2000  . Hyperlipidemia 2003  . Vitamin D deficiency 2009  . History of renal cell carcinoma 1994    Status post right nephrectomy  . BPH (benign prostatic hyperplasia) 2013  . Glaucoma 2007    Status post left trabeculectomy 2007  . History of colon polyps     Colonoscopy 2001  . Allergic rhinitis     Prior allergy shots 20 years  . Colitis 2014  . Anemia   . Cancer ([place])     renal cell ca and skin cancer   . Nocturia   . History of kidney stones   . Difficult intubation 1994    surgery had to be  stopped due to injury to "throat"  . Difficult intubation 1994    no trouble since.  Required nasotracheal intubation  '02 [facility] / ANESTHESIA RECORD FROM 2013 AND 2016 IN EPIC   Past Surgical  History  Procedure Laterality Date  . Tonsillectomy    . Eye surgery  over last 6 yrs.      trabeculectomy...   . Eye surgery   cat ext ou  . Brain surgery  2000    pituatary gland removed .  Marland Kitchen Joint replacement  2011    knee left  . Cholecystectomy  1984  . Reverse shoulder arthroplasty  12/15/2011    Procedure: REVERSE SHOULDER ARTHROPLASTY;  Surgeon: Marin Shutter, MD;  Location: [facility];  Service: Orthopedics;  Laterality: Right;  right total reverse shoulder  . Nephrectomy Right 1994    Renal cell  . Transsphenoidal excision pituitary tumor  05/1999    A.Tommi Rumps, M.D.([facility])  . Ectropion surgery Bilateral 2006  . Mohs procedure       for skin cancer on nose   . Knee arthroscopy Right 02/06/2015    Procedure: RIGHT ARTHROSCOPY KNEE WITH LATERAL MENSICAL  DEBRIDEMENT;  Surgeon: Gaynelle Arabian, MD;  Location: [facility];  Service: Orthopedics;  Laterality: Right;  . Total knee arthroplasty Right 06/29/2015    Procedure: TOTAL KNEE ARTHROPLASTY;  Surgeon: Gaynelle Arabian, MD;  Location: [facility];  Service: Orthopedics;  Laterality: Right;   Family History  Problem Relation Age of Onset  . Anesthesia problems Neg Hx   . Hypotension Neg Hx   . Malignant hyperthermia Neg Hx   . Pseudochol deficiency Neg  Hx   . Lung cancer Father    Social History  Substance Use Topics  . Smoking status: Former Smoker    Types: Cigarettes, Pipe    Quit date: 09/26/1988  . Smokeless tobacco: Never Used  . Alcohol Use: Yes     Comment: 1.5-2 ounces of vodka nightly    DO NOT RESUSCITATE CODE STATUS. Has MOST form  Review of Systems  Constitutional: Positive for fever.  Skin: Positive for wound.       Skin graft on right shin placed 8 days ago  Allergic/Immunologic: Positive for immunocompromised state.       Chronically on prednisone  Neurological: Positive for weakness.      Allergies  Percocet and Diovan  Home Medications   Prior to Admission medications   Medication Sig Start Date End  Date Taking? Authorizing Provider  co-enzyme Q-10 30 MG capsule Take 30 mg by mouth daily.   Yes Historical Provider, MD  finasteride (PROSCAR) 5 MG tablet Take 5 mg by mouth every morning.    Yes Historical Provider, MD  levothyroxine (SYNTHROID, LEVOTHROID) 88 MCG tablet Take 88 mcg by mouth daily.   Yes Historical Provider, MD  Multiple Vitamins-Minerals (MULTIVITAMIN & MINERAL PO) Take 1 tablet by mouth daily.   Yes Historical Provider, MD  pravastatin (PRAVACHOL) 80 MG tablet Take 80 mg by mouth every evening.    Yes Historical Provider, MD  predniSONE (DELTASONE) 2.5 MG tablet Take 2.5-5 mg by mouth 2 (two) times daily with a meal. Take 2 tablets (5 mg) in the am and Take 1 tablet (2.5 mg) in the evening.   Yes Historical Provider, MD  Testosterone Cypionate (DEPO-TESTOSTERONE IM) Inject into the muscle. Patient states he takes .15mg  every week per patient   Yes Historical Provider, MD  vitamin B-12 (CYANOCOBALAMIN) 1000 MCG tablet Take 1,000 mcg by mouth daily.   Yes Historical Provider, MD  VITAMIN D, ERGOCALCIFEROL, PO Take 2,000 Units by mouth daily.    Yes Historical Provider, MD  bisacodyl (DULCOLAX) 10 MG suppository Place 1 suppository (10 mg total) rectally daily as needed for moderate constipation. Patient not taking: Reported on 08/26/2015 07/01/15   Arlee Muslim, PA-C  metoCLOPramide (REGLAN) 5 MG tablet Take 1 tablet (5 mg total) by mouth every 8 (eight) hours as needed for nausea (if ondansetron (ZOFRAN) ineffective.). Patient not taking: Reported on 08/26/2015 07/01/15   Arlee Muslim, PA-C  ondansetron (ZOFRAN) 4 MG tablet Take 1 tablet (4 mg total) by mouth every 6 (six) hours as needed for nausea. Patient not taking: Reported on 08/26/2015 07/01/15   Arlee Muslim, PA-C   BP 123/58 mmHg  Pulse 76  Temp(Src) 98.5 F (36.9 C) (Oral)  Resp 11  SpO2 95% Physical Exam  Constitutional: He appears well-developed and well-nourished. No distress.  Mucous membranes dry  HENT:   Head: Normocephalic and atraumatic.  Eyes: Conjunctivae are normal. Pupils are equal, round, and reactive to light.  Neck: Neck supple. No tracheal deviation present. No thyromegaly present.  Cardiovascular: Normal rate and regular rhythm.   No murmur heard. Pulmonary/Chest: Effort normal and breath sounds normal.  Abdominal: Soft. Bowel sounds are normal. He exhibits no distension. There is no tenderness.  Musculoskeletal: Normal range of motion. He exhibits no edema or tenderness.  Neurological: He is alert. No cranial nerve deficit. Coordination normal.  Skin: Skin is warm and dry. No rash noted.  Well-healing wound on right shin. No drainage Bilateral lower extremities minimally reddened at lower legs all 4  extremity is neurovascularly intact  Psychiatric: He has a normal mood and affect.  Nursing note and vitals reviewed.   ED Course  Procedures (including critical care time) Labs Review Labs Reviewed - No data to display  Imaging Review No results found. I have personally reviewed and evaluated these images and lab results as part of my medical decision-making.   EKG Interpretation None     Chest x-ray viewed by me. Results for orders placed or performed during the hospital encounter of 10/20/15  Comprehensive metabolic panel  Result Value Ref Range   Sodium 129 (L) 135 - 145 mmol/L   Potassium 3.7 3.5 - 5.1 mmol/L   Chloride 96 (L) 101 - 111 mmol/L   CO2 25 22 - 32 mmol/L   Glucose, Bld 84 65 - 99 mg/dL   BUN 14 6 - 20 mg/dL   Creatinine, Ser 1.28 (H) 0.61 - 1.24 mg/dL   Calcium 8.4 (L) 8.9 - 10.3 mg/dL   Total Protein 5.2 (L) 6.5 - 8.1 g/dL   Albumin 2.8 (L) 3.5 - 5.0 g/dL   AST 14 (L) 15 - 41 U/L   ALT 11 (L) 17 - 63 U/L   Alkaline Phosphatase 40 38 - 126 U/L   Total Bilirubin 1.0 0.3 - 1.2 mg/dL   GFR calc non Af Amer 48 (L) >60 mL/min   GFR calc Af Amer 55 (L) >60 mL/min   Anion gap 8 5 - 15  CBC WITH DIFFERENTIAL  Result Value Ref Range   WBC 10.1 4.0  - 10.5 K/uL   RBC 3.40 (L) 4.22 - 5.81 MIL/uL   Hemoglobin 10.3 (L) 13.0 - 17.0 g/dL   HCT 32.7 (L) 39.0 - 52.0 %   MCV 96.2 78.0 - 100.0 fL   MCH 30.3 26.0 - 34.0 pg   MCHC 31.5 30.0 - 36.0 g/dL   RDW 15.6 (H) 11.5 - 15.5 %   Platelets 249 150 - 400 K/uL   Neutrophils Relative % 58 %   Neutro Abs 5.8 1.7 - 7.7 K/uL   Lymphocytes Relative 29 %   Lymphs Abs 3.0 0.7 - 4.0 K/uL   Monocytes Relative 11 %   Monocytes Absolute 1.2 (H) 0.1 - 1.0 K/uL   Eosinophils Relative 2 %   Eosinophils Absolute 0.2 0.0 - 0.7 K/uL   Basophils Relative 0 %   Basophils Absolute 0.0 0.0 - 0.1 K/uL  Urinalysis, Routine w reflex microscopic (not at [facility])  Result Value Ref Range   Color, Urine YELLOW YELLOW   APPearance CLEAR CLEAR   Specific Gravity, Urine 1.013 1.005 - 1.030   pH 5.5 5.0 - 8.0   Glucose, UA NEGATIVE NEGATIVE mg/dL   Hgb urine dipstick NEGATIVE NEGATIVE   Bilirubin Urine NEGATIVE NEGATIVE   Ketones, ur 15 (A) NEGATIVE mg/dL   Protein, ur NEGATIVE NEGATIVE mg/dL   Nitrite NEGATIVE NEGATIVE   Leukocytes, UA NEGATIVE NEGATIVE  I-Stat CG4 Lactic Acid, ED  (not at  [facility])  Result Value Ref Range   Lactic Acid, Venous 0.91 0.5 - 2.0 mmol/L   Dg Chest 2 View  10/20/2015  CLINICAL DATA:  82 year old male with fever and hypotension. Weakness, nausea and fever. EXAM: CHEST  2 VIEW COMPARISON:  07/24/2015 FINDINGS: Cardiomediastinal contours are unchanged. Prominent right upper cart pad. No pulmonary edema, confluent airspace disease, pleural effusion or pneumothorax. Reverse right shoulder arthroplasty in place. IMPRESSION: No acute pulmonary process. Electronically Signed   By: Jeb Levering M.D.   On: 10/20/2015  21:51    MDM  Code sepsis called based on Sirs criteria of fever and hypotension. Source of infection unknown presently. Concerned the patient was transiently hypotensive and is immunocompromised Final diagnoses:  None   Dr. Olevia Bowens consulted for inpatient stay Diagnosis#1  febrile illness #2 hyponatremia     Orlie Dakin, MD 10/20/15 2349  Orlie Dakin, MD 10/21/15 [id number]

## 2015-10-21 DIAGNOSIS — D538 Other specified nutritional anemias: Secondary | ICD-10-CM | POA: Diagnosis not present

## 2015-10-21 DIAGNOSIS — Z888 Allergy status to other drugs, medicaments and biological substances status: Secondary | ICD-10-CM | POA: Diagnosis not present

## 2015-10-21 DIAGNOSIS — A419 Sepsis, unspecified organism: Secondary | ICD-10-CM | POA: Diagnosis not present

## 2015-10-21 DIAGNOSIS — R509 Fever, unspecified: Secondary | ICD-10-CM

## 2015-10-21 DIAGNOSIS — Z885 Allergy status to narcotic agent status: Secondary | ICD-10-CM | POA: Diagnosis not present

## 2015-10-21 DIAGNOSIS — Z96611 Presence of right artificial shoulder joint: Secondary | ICD-10-CM | POA: Diagnosis not present

## 2015-10-21 DIAGNOSIS — D519 Vitamin B12 deficiency anemia, unspecified: Secondary | ICD-10-CM | POA: Diagnosis not present

## 2015-10-21 DIAGNOSIS — E871 Hypo-osmolality and hyponatremia: Secondary | ICD-10-CM | POA: Diagnosis not present

## 2015-10-21 DIAGNOSIS — Z79899 Other long term (current) drug therapy: Secondary | ICD-10-CM | POA: Diagnosis not present

## 2015-10-21 DIAGNOSIS — E538 Deficiency of other specified B group vitamins: Secondary | ICD-10-CM | POA: Diagnosis not present

## 2015-10-21 DIAGNOSIS — Z801 Family history of malignant neoplasm of trachea, bronchus and lung: Secondary | ICD-10-CM | POA: Diagnosis not present

## 2015-10-21 DIAGNOSIS — Z9049 Acquired absence of other specified parts of digestive tract: Secondary | ICD-10-CM | POA: Diagnosis not present

## 2015-10-21 DIAGNOSIS — M1990 Unspecified osteoarthritis, unspecified site: Secondary | ICD-10-CM | POA: Diagnosis not present

## 2015-10-21 DIAGNOSIS — Z66 Do not resuscitate: Secondary | ICD-10-CM | POA: Diagnosis not present

## 2015-10-21 DIAGNOSIS — Z85828 Personal history of other malignant neoplasm of skin: Secondary | ICD-10-CM | POA: Diagnosis not present

## 2015-10-21 DIAGNOSIS — Z905 Acquired absence of kidney: Secondary | ICD-10-CM | POA: Diagnosis not present

## 2015-10-21 DIAGNOSIS — L03115 Cellulitis of right lower limb: Secondary | ICD-10-CM | POA: Diagnosis not present

## 2015-10-21 DIAGNOSIS — E860 Dehydration: Secondary | ICD-10-CM | POA: Diagnosis not present

## 2015-10-21 DIAGNOSIS — D649 Anemia, unspecified: Secondary | ICD-10-CM | POA: Diagnosis not present

## 2015-10-21 DIAGNOSIS — I10 Essential (primary) hypertension: Secondary | ICD-10-CM | POA: Diagnosis not present

## 2015-10-21 DIAGNOSIS — Z7952 Long term (current) use of systemic steroids: Secondary | ICD-10-CM | POA: Diagnosis not present

## 2015-10-21 DIAGNOSIS — Z87442 Personal history of urinary calculi: Secondary | ICD-10-CM | POA: Diagnosis not present

## 2015-10-21 DIAGNOSIS — H409 Unspecified glaucoma: Secondary | ICD-10-CM | POA: Diagnosis not present

## 2015-10-21 DIAGNOSIS — E230 Hypopituitarism: Secondary | ICD-10-CM | POA: Diagnosis not present

## 2015-10-21 DIAGNOSIS — E785 Hyperlipidemia, unspecified: Secondary | ICD-10-CM | POA: Diagnosis not present

## 2015-10-21 DIAGNOSIS — N400 Enlarged prostate without lower urinary tract symptoms: Secondary | ICD-10-CM | POA: Diagnosis not present

## 2015-10-21 DIAGNOSIS — I959 Hypotension, unspecified: Secondary | ICD-10-CM | POA: Diagnosis not present

## 2015-10-21 DIAGNOSIS — Z87891 Personal history of nicotine dependence: Secondary | ICD-10-CM | POA: Diagnosis not present

## 2015-10-21 DIAGNOSIS — E039 Hypothyroidism, unspecified: Secondary | ICD-10-CM

## 2015-10-21 DIAGNOSIS — Z85528 Personal history of other malignant neoplasm of kidney: Secondary | ICD-10-CM | POA: Diagnosis not present

## 2015-10-21 DIAGNOSIS — E559 Vitamin D deficiency, unspecified: Secondary | ICD-10-CM | POA: Diagnosis not present

## 2015-10-21 DIAGNOSIS — Z86010 Personal history of colonic polyps: Secondary | ICD-10-CM | POA: Diagnosis not present

## 2015-10-21 LAB — COMPREHENSIVE METABOLIC PANEL
ALBUMIN: 2.5 g/dL — AB (ref 3.5–5.0)
ALT: 11 U/L — ABNORMAL LOW (ref 17–63)
ANION GAP: 6 (ref 5–15)
AST: 16 U/L (ref 15–41)
Alkaline Phosphatase: 40 U/L (ref 38–126)
BUN: 14 mg/dL (ref 6–20)
CALCIUM: 7.9 mg/dL — AB (ref 8.9–10.3)
CO2: 25 mmol/L (ref 22–32)
Chloride: 105 mmol/L (ref 101–111)
Creatinine, Ser: 1.12 mg/dL (ref 0.61–1.24)
GFR calc Af Amer: >60 mL/min (ref >60)
GFR calc non Af Amer: 56 mL/min — ABNORMAL LOW (ref >60)
GLUCOSE: 124 mg/dL — AB (ref 65–99)
POTASSIUM: 3.5 mmol/L (ref 3.5–5.1)
SODIUM: 136 mmol/L (ref 135–145)
TOTAL PROTEIN: 5 g/dL — AB (ref 6.5–8.1)
Total Bilirubin: 0.6 mg/dL (ref 0.3–1.2)

## 2015-10-21 LAB — CBC WITH DIFFERENTIAL/PLATELET
BASOS ABS: 0 10*3/uL (ref 0.0–0.1)
Basophils Relative: 0 %
EOS ABS: 0.2 10*3/uL (ref 0.0–0.7)
Eosinophils Relative: 2 %
HEMATOCRIT: 33.4 % — AB (ref 39.0–52.0)
Hemoglobin: 10.6 g/dL — ABNORMAL LOW (ref 13.0–17.0)
LYMPHS PCT: 26 %
Lymphs Abs: 2.1 10*3/uL (ref 0.7–4.0)
MCH: 30.5 pg (ref 26.0–34.0)
MCHC: 31.7 g/dL (ref 30.0–36.0)
MCV: 96.3 fL (ref 78.0–100.0)
MONO ABS: 0.7 10*3/uL (ref 0.1–1.0)
Monocytes Relative: 9 %
NEUTROS ABS: 4.9 10*3/uL (ref 1.7–7.7)
Neutrophils Relative %: 63 %
PLATELETS: 255 10*3/uL (ref 150–400)
RBC: 3.47 MIL/uL — ABNORMAL LOW (ref 4.22–5.81)
RDW: 15.6 % — AB (ref 11.5–15.5)
WBC: 7.9 10*3/uL (ref 4.0–10.5)

## 2015-10-21 LAB — MAGNESIUM: MAGNESIUM: 1.8 mg/dL (ref 1.7–2.4)

## 2015-10-21 LAB — INFLUENZA PANEL BY PCR (TYPE A & B)
H1N1FLUPCR: NOT DETECTED
INFLAPCR: NEGATIVE
Influenza B By PCR: NEGATIVE

## 2015-10-21 LAB — TSH: TSH: 0.031 u[IU]/mL — AB (ref 0.350–4.500)

## 2015-10-21 LAB — PHOSPHORUS: PHOSPHORUS: 2.8 mg/dL (ref 2.5–4.6)

## 2015-10-21 LAB — T4, FREE: Free T4: 0.99 ng/dL (ref 0.61–1.12)

## 2015-10-21 MED ORDER — SODIUM CHLORIDE 0.9% FLUSH
3.0000 mL | Freq: Two times a day (BID) | INTRAVENOUS | Status: DC
  Administered 2015-10-21 – 2015-10-22 (×3): 3 mL via INTRAVENOUS

## 2015-10-21 MED ORDER — LEVOTHYROXINE SODIUM 88 MCG PO TABS
88.0000 ug | ORAL_TABLET | Freq: Every day | ORAL | Status: DC
  Administered 2015-10-21 – 2015-10-24 (×4): 88 ug via ORAL
  Filled 2015-10-21 (×5): qty 1

## 2015-10-21 MED ORDER — CETYLPYRIDINIUM CHLORIDE 0.05 % MT LIQD
7.0000 mL | Freq: Two times a day (BID) | OROMUCOSAL | Status: DC
  Administered 2015-10-21 – 2015-10-23 (×4): 7 mL via OROMUCOSAL

## 2015-10-21 MED ORDER — VITAMIN B-12 1000 MCG PO TABS
1000.0000 ug | ORAL_TABLET | Freq: Every day | ORAL | Status: DC
  Administered 2015-10-21 – 2015-10-24 (×4): 1000 ug via ORAL
  Filled 2015-10-21 (×4): qty 1

## 2015-10-21 MED ORDER — ACETAMINOPHEN 325 MG PO TABS
650.0000 mg | ORAL_TABLET | Freq: Four times a day (QID) | ORAL | Status: DC | PRN
  Administered 2015-10-21: 650 mg via ORAL
  Filled 2015-10-21: qty 2

## 2015-10-21 MED ORDER — ENOXAPARIN SODIUM 40 MG/0.4ML ~~LOC~~ SOLN
40.0000 mg | SUBCUTANEOUS | Status: DC
  Administered 2015-10-21 – 2015-10-22 (×2): 40 mg via SUBCUTANEOUS
  Filled 2015-10-21 (×4): qty 0.4

## 2015-10-21 MED ORDER — COENZYME Q10 30 MG PO CAPS: 30.0000 mg | ORAL_CAPSULE | Freq: Every day | ORAL | Status: DC

## 2015-10-21 MED ORDER — FINASTERIDE 5 MG PO TABS
5.0000 mg | ORAL_TABLET | Freq: Every morning | ORAL | Status: DC
  Administered 2015-10-21 – 2015-10-24 (×4): 5 mg via ORAL
  Filled 2015-10-21 (×4): qty 1

## 2015-10-21 MED ORDER — HYDRALAZINE HCL 20 MG/ML IJ SOLN: 5.0000 mg | Freq: Four times a day (QID) | INTRAMUSCULAR | Status: DC | PRN

## 2015-10-21 MED ORDER — PRAVASTATIN SODIUM 80 MG PO TABS
80.0000 mg | ORAL_TABLET | Freq: Every evening | ORAL | Status: DC
  Administered 2015-10-21 – 2015-10-23 (×3): 80 mg via ORAL
  Filled 2015-10-21 (×4): qty 1

## 2015-10-21 MED ORDER — PREDNISONE 5 MG PO TABS
5.0000 mg | ORAL_TABLET | Freq: Two times a day (BID) | ORAL | Status: DC
  Administered 2015-10-21 – 2015-10-24 (×7): 5 mg via ORAL
  Filled 2015-10-21 (×9): qty 1

## 2015-10-21 MED ORDER — CHLORHEXIDINE GLUCONATE 0.12 % MT SOLN
15.0000 mL | Freq: Two times a day (BID) | OROMUCOSAL | Status: DC
  Administered 2015-10-21 – 2015-10-23 (×4): 15 mL via OROMUCOSAL
  Filled 2015-10-21 (×6): qty 15

## 2015-10-21 MED ORDER — VITAMIN D 1000 UNITS PO TABS
2000.0000 [IU] | ORAL_TABLET | Freq: Every day | ORAL | Status: DC
  Administered 2015-10-21 – 2015-10-24 (×4): 2000 [IU] via ORAL
  Filled 2015-10-21 (×4): qty 2

## 2015-10-21 MED ORDER — ONDANSETRON HCL 4 MG/2ML IJ SOLN
4.0000 mg | Freq: Four times a day (QID) | INTRAMUSCULAR | Status: DC | PRN
  Administered 2015-10-21 (×2): 4 mg via INTRAVENOUS
  Filled 2015-10-21 (×2): qty 2

## 2015-10-21 MED ORDER — ADULT MULTIVITAMIN LIQUID CH
5.0000 mL | Freq: Every day | ORAL | Status: DC
  Administered 2015-10-21 – 2015-10-23 (×3): 5 mL via ORAL
  Filled 2015-10-21 (×4): qty 5

## 2015-10-21 MED ORDER — POTASSIUM CHLORIDE IN NACL 20-0.9 MEQ/L-% IV SOLN
INTRAVENOUS | Status: DC
  Administered 2015-10-21 (×2): via INTRAVENOUS
  Filled 2015-10-21 (×4): qty 1000

## 2015-10-21 MED ORDER — ONDANSETRON HCL 4 MG PO TABS: 4.0000 mg | ORAL_TABLET | Freq: Four times a day (QID) | ORAL | Status: DC | PRN

## 2015-10-21 MED ORDER — ENSURE ENLIVE PO LIQD
237.0000 mL | Freq: Three times a day (TID) | ORAL | Status: DC
  Administered 2015-10-21 – 2015-10-23 (×7): 237 mL via ORAL
  Filled 2015-10-21 (×4): qty 237

## 2015-10-21 NOTE — ED Notes (Signed)
Removed  3 blankets

## 2015-10-21 NOTE — Progress Notes (Signed)
[facility] PROGRESS NOTE  [name] [id number] DOB: 1923-03-22 DOA: 10/20/2015  PCP: [name], MD  Brief HPI: 82 year old Caucasian male with a past medical history of hypertension, arthritis, B-12 deficiency, BPH, presented from his nursing home facility with the fever of 101F and hypotension with blood pressure in the 80s. No obvious source of infection was identified. Patient's blood pressure improved with IV fluids. He was hospitalized for further evaluation.  Past medical history:  Past Medical History  Diagnosis Date  . Hypertension   . Arthritis     Status post left total replacement 8 2011  . Pituitary mass ([place]) 2000    S/p transphenoidal excision 05/1999 ([facility])  . 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 Hypothyroidism (acquired) 2000  . Anemia, B12 deficiency 2000  . Hyperlipidemia 2003  . Vitamin D deficiency 2009  . History of renal cell carcinoma 1994    Status post right nephrectomy  . BPH (benign prostatic hyperplasia) 2013  . Glaucoma 2007    Status post left trabeculectomy 2007  . History of colon polyps     Colonoscopy 2001  . Allergic rhinitis     Prior allergy shots 20 years  . Colitis 2014  . Anemia   . Cancer ([place])     renal cell ca and skin cancer   . Nocturia   . History of kidney stones   . Difficult intubation 1994    surgery had to be  stopped due to injury to "throat"  . Difficult intubation 1994    no trouble since.  Required nasotracheal intubation  '02 [facility] / ANESTHESIA RECORD FROM 2013 AND 2016 IN EPIC    Consultants: None  Procedures: None  Antibiotics: Vancomycin and Zosyn  Subjective: Patient complains of feeling cold this morning. He is not a very good historian. He denies any cough. Has had a wound in his left leg and had skin grafting a few days ago. Is unable to tell me if the redness in the right leg is old or new.  Objective: Vital  Signs  Filed Vitals:   10/21/15 1030 10/21/15 1115 10/21/15 1130 10/21/15 1230  BP: 155/66  148/88 158/77  Pulse: 90  93 97  Temp: 99.3 F (37.4 C) 100.3 F (37.9 C)    TempSrc: Oral Rectal    Resp: 18  16 16   Height:      Weight:      SpO2: 93%  97% 94%    Intake/Output Summary (Last 24 hours) at 10/21/15 1301 Last data filed at 10/21/15 1116  Gross per 24 hour  Intake      0 ml  Output    951 ml  Net   -951 ml   Filed Weights   10/20/15 2208 10/21/15 0900  Weight: 87.5 kg (192 lb 14.4 oz) 87.091 kg (192 lb)    General appearance: alert, cooperative, appears stated age and no distress Resp: clear to auscultation bilaterally Cardio: regular rate and rhythm, S1, S2 normal, no murmur, click, rub or gallop GI: soft, non-tender; bowel sounds normal; no masses,  no organomegaly Extremities: Erythema with the venous stasis dermatitis noted in the right lower extremity. It is warm to touch. The left leg is covered in dressing which was recently changed this morning. At patient's request this was not removed. Neurologic: Awake and alert. No focal neurological deficits.  Lab Results:  Basic Metabolic Panel:  Recent Labs Lab 10/20/15 2120  10/21/15 0612  NA 129* 136  K 3.7 3.5  CL 96* 105  CO2 25 25  GLUCOSE 84 124*  BUN 14 14  CREATININE 1.28* 1.12  CALCIUM 8.4* 7.9*  MG 1.8  --   PHOS 2.8  --    Liver Function Tests:  Recent Labs Lab 10/20/15 2120 10/21/15 0612  AST 14* 16  ALT 11* 11*  ALKPHOS 40 40  BILITOT 1.0 0.6  PROT 5.2* 5.0*  ALBUMIN 2.8* 2.5*   CBC:  Recent Labs Lab 10/20/15 2120 10/21/15 0612  WBC 10.1 7.9  NEUTROABS 5.8 4.9  HGB 10.3* 10.6*  HCT 32.7* 33.4*  MCV 96.2 96.3  PLT 249 255     Studies/Results: Dg Chest 2 View  10/20/2015  CLINICAL DATA:  81 year old male with fever and hypotension. Weakness, nausea and fever. EXAM: CHEST  2 VIEW COMPARISON:  07/24/2015 FINDINGS: Cardiomediastinal contours are unchanged. Prominent right  upper cart pad. No pulmonary edema, confluent airspace disease, pleural effusion or pneumothorax. Reverse right shoulder arthroplasty in place. IMPRESSION: No acute pulmonary process. Electronically Signed   By: Jeb Levering M.D.   On: 10/20/2015 21:51    Medications:  Scheduled: . cholecalciferol  2,000 Units Oral Daily  . enoxaparin (LOVENOX) injection  40 mg Subcutaneous Q24H  . feeding supplement (ENSURE ENLIVE)  237 mL Oral TID BM  . finasteride  5 mg Oral q morning - 10a  . levothyroxine  88 mcg Oral QAC breakfast  . multivitamin  5 mL Oral Daily  . pravastatin  80 mg Oral QPM  . predniSONE  5 mg Oral BID WC  . sodium chloride flush  3 mL Intravenous Q12H  . vitamin B-12  1,000 mcg Oral Daily   Continuous: . 0.9 % NaCl with KCl 20 mEq / L 50 mL/hr at 10/21/15 0901  . piperacillin-tazobactam (ZOSYN)  IV Stopped (10/21/15 1005)  . vancomycin     [id number], ondansetron **OR** ondansetron (ZOFRAN) IV  Assessment/Plan:  Principal Problem:   Sepsis ([place]) Active Problems:   Hypertension   Hypothyroidism (acquired)   Anemia, B12 deficiency   Hyponatremia   Dehydration    Fever and Sepsis due to unknown organism/possible right lower extremity cellulitis No obvious source identified except for the right lower extremity erythema. Unclear if this is new or old. Continue with vancomycin and Zosyn for now. Monitor right leg. Influenza PCR is pending. Follow-up blood cultures. Wound care for the left lower extremity. Recently underwent skin grafting at that site.  Hyponatremia Corrected with IV fluids. Cut back on fluid rate.  Hypotension  Blood pressure was low when he was initially evaluated in the emergency department. Blood pressure has improved with IV fluids. Continue to monitor. Patient is noted to be on prednisone on a chronic basis. Stress dose steroids were considered. However, since blood pressure has improved. Will hold off for now.  Hypopituitarism secondary  to removal of pituitary mass/Hypothyroidism Continue levothyroxine supplementation. TSH noted to be low. Check free T4. Patient is on low-dose prednisone, which will be continued.  Anemia, B12 deficiency Continue vitamin B12 supplementation.  Dehydration Improved with IV hydration.  DVT Prophylaxis: Lovenox    Code Status: DO NOT RESUSCITATE  Family Communication: Discussed with the patient. No family at bedside  Disposition Plan: Continue management as outlined above. PT and OT evaluation.    LOS: 0 days   [facility] Pager [phone] 10/21/2015, 1:01 PM  If 7PM-7AM, please contact night-coverage at [email or web address], password [email or web address]

## 2015-10-21 NOTE — H&P (Signed)
[facility] History and Physical  [employer]. [id number] DOB: 1922-12-17 DOA: 10/20/2015  Referring physician: Orlie Dakin, MD PCP: [name], MD   Chief Complaint: Fever and hypotension.  HPI: [name] [name]. is a 82 y.o. male  with a past medical history hypertension, ulcer arthritis, history of pituitary mass, status post transsphenoidal excision in October 2000, hypothyroidism, B12 deficiency anemia, hyperlipidemia, vitamin D deficiency, history of renal cell carcinoma, status post right nephrectomy, BPH, glaucoma who comes from nursing home facility via EMS with complaints of fever of 101.8 and hypotension with blood pressures in the 80s/40s.   Per patient, his symptoms started today when he did not feel well. He has been having myalgias, nausea, dizziness, fatigue and malaise. He denies any sick contacts to his knowledge or travel history. During transport to the emergency department he received a 500 mL a NS bolus by EMS, and a 1 L bolus in the emergency department. Since then, his blood pressure has normalized and he states that he feels better. He denies chest pain, palpitations, orthopnea, PND, wheezing, productive cough, abdominal pain, emesis, diarrhea, melena, hematochezia or constipation. He denies GU symptoms. He is taking prednisone daily.  When seen, the patient stated that he was feeling a lot better after hydration, but was still feeling sick. He stated that his nausea Workup in the emergency department is significant for hyponatremia, mildly elevated creatinine, anemia.    Review of Systems:  Constitutional:  Positive for Fevers, chills, fatigue.  No weight loss, night sweats,  HEENT:  No headaches, Difficulty swallowing,Tooth/dental problems,Sore throat,  No sneezing, itching, ear ache, nasal congestion, post nasal drip,  Cardio-vascular:  Positive for dizziness, swelling in lower extremities No chest pain, Orthopnea, PND,  anasarca,   palpitations  GI:  Positive for nausea, loss of appetite  No heartburn, indigestion, abdominal pain,  vomiting, diarrhea, change in bowel habits Resp:  Denies dyspnea, cough, hemoptysis or wheezing.  Skin:  Left lower extremity wound  no dysuria, change in color of urine, no urgency or frequency. No flank pain.  Musculoskeletal: Positive myalgias and arthralgias. No back pain.  Psych:  No change in mood or affect. No depression or anxiety. No memory loss.   Past Medical History  Diagnosis Date  . Hypertension   . Arthritis     Status post left total replacement 8 2011  . Pituitary mass ([place]) 2000    S/p transphenoidal excision 05/1999 ([facility])  . 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 Hypothyroidism (acquired) 2000  . Anemia, B12 deficiency 2000  . Hyperlipidemia 2003  . Vitamin D deficiency 2009  . History of renal cell carcinoma 1994    Status post right nephrectomy  . BPH (benign prostatic hyperplasia) 2013  . Glaucoma 2007    Status post left trabeculectomy 2007  . History of colon polyps     Colonoscopy 2001  . Allergic rhinitis     Prior allergy shots 20 years  . Colitis 2014  . Anemia   . Cancer ([place])     renal cell ca and skin cancer   . Nocturia   . History of kidney stones   . Difficult intubation 1994    surgery had to be  stopped due to injury to "throat"  . Difficult intubation 1994    no trouble since.  Required nasotracheal intubation  '02 [facility] / ANESTHESIA RECORD FROM 2013 AND 2016 IN EPIC   Past  Surgical History  Procedure Laterality Date  . Tonsillectomy    . Eye surgery  over last 6 yrs.      trabeculectomy...   . Eye surgery   cat ext ou  . Brain surgery  2000    pituatary gland removed .  Marland Kitchen Joint replacement  2011    knee left  . Cholecystectomy  1984  . Reverse shoulder arthroplasty  12/15/2011    Procedure: REVERSE SHOULDER ARTHROPLASTY;  Surgeon: Marin Shutter, MD;  Location: [facility];  Service: Orthopedics;  Laterality: Right;  right total reverse shoulder  . Nephrectomy Right 1994    Renal cell  . Transsphenoidal excision pituitary tumor  05/1999    A.Tommi Rumps, M.D.([facility])  . Ectropion surgery Bilateral 2006  . Mohs procedure       for skin cancer on nose   . Knee arthroscopy Right 02/06/2015    Procedure: RIGHT ARTHROSCOPY KNEE WITH LATERAL MENSICAL  DEBRIDEMENT;  Surgeon: Gaynelle Arabian, MD;  Location: [facility];  Service: Orthopedics;  Laterality: Right;  . Total knee arthroplasty Right 06/29/2015    Procedure: TOTAL KNEE ARTHROPLASTY;  Surgeon: Gaynelle Arabian, MD;  Location: [facility];  Service: Orthopedics;  Laterality: Right;   Social History:  reports that he quit smoking about 27 years ago. His smoking use included Cigarettes and Pipe. He has never used smokeless tobacco. He reports that he drinks alcohol. He reports that he does not use illicit drugs.  Allergies  Allergen Reactions  . Percocet [Oxycodone-Acetaminophen] Other (See Comments)    Just doesn't like it  . Diovan [Valsartan] Rash    Family History  Problem Relation Age of Onset  . Anesthesia problems Neg Hx   . Hypotension Neg Hx   . Malignant hyperthermia Neg Hx   . Pseudochol deficiency Neg Hx   . Lung cancer Father     Prior to Admission medications   Medication Sig Start Date End Date Taking? Authorizing Provider  co-enzyme Q-10 30 MG capsule Take 30 mg by mouth daily.   Yes Historical Provider, MD  finasteride (PROSCAR) 5 MG tablet Take 5 mg by mouth every morning.    Yes Historical Provider, MD  levothyroxine (SYNTHROID, LEVOTHROID) 88 MCG tablet Take 88 mcg by mouth daily.   Yes Historical Provider, MD  Multiple Vitamins-Minerals (MULTIVITAMIN & MINERAL PO) Take 1 tablet by mouth daily.   Yes Historical Provider, MD  pravastatin (PRAVACHOL) 80 MG tablet Take 80 mg by mouth every evening.    Yes Historical Provider, MD  predniSONE (DELTASONE) 2.5 MG tablet Take 2.5-5 mg by mouth 2 (two)  times daily with a meal. Take 2 tablets (5 mg) in the am and Take 1 tablet (2.5 mg) in the evening.   Yes Historical Provider, MD  Testosterone Cypionate (DEPO-TESTOSTERONE IM) Inject into the muscle. Patient states he takes .15mg  every week per patient   Yes Historical Provider, MD  vitamin B-12 (CYANOCOBALAMIN) 1000 MCG tablet Take 1,000 mcg by mouth daily.   Yes Historical Provider, MD  VITAMIN D, ERGOCALCIFEROL, PO Take 2,000 Units by mouth daily.    Yes Historical Provider, MD  bisacodyl (DULCOLAX) 10 MG suppository Place 1 suppository (10 mg total) rectally daily as needed for moderate constipation. Patient not taking: Reported on 08/26/2015 07/01/15   Arlee Muslim, PA-C  metoCLOPramide (REGLAN) 5 MG tablet Take 1 tablet (5 mg total) by mouth every 8 (eight) hours as needed for nausea (if ondansetron (ZOFRAN) ineffective.). Patient not taking: Reported on 08/26/2015 07/01/15  Arlee Muslim, PA-C  ondansetron (ZOFRAN) 4 MG tablet Take 1 tablet (4 mg total) by mouth every 6 (six) hours as needed for nausea. Patient not taking: Reported on 08/26/2015 07/01/15   Arlee Muslim, PA-C   Physical Exam: Filed Vitals:   10/20/15 2203 10/20/15 2208 10/20/15 2300 10/20/15 2330  BP: 125/56  125/61 152/64  Pulse: 67  65 76  Temp:      TempSrc:      Resp: 14  14 14   Weight:  87.5 kg (192 lb 14.4 oz)    SpO2: 97%  96% 96%    Wt Readings from Last 3 Encounters:  10/20/15 87.5 kg (192 lb 14.4 oz)  07/28/15 87.544 kg (193 lb)  07/07/15 87.544 kg (193 lb)    General:  Appears calm and comfortable Eyes: PERRL, normal lids, irises & conjunctiva ENT: grossly normal hearing, lips & tongue, oral mucosa is dry. Neck: no LAD, masses or thyromegaly Cardiovascular: RRR, no m/r/g. No LE edema. Telemetry: SR, no arrhythmias  Respiratory: Bilateral crackles, no wheezing, no rhonchi. Normal respiratory effort. Abdomen: soft, ntnd Skin: Several ecchymosis noted on upper extremities, dressing with Ace wrap on  left lower           Extremity. No signs of infection per ER physician when seen earlier before dressing         change.  Musculoskeletal: grossly normal tone BUE/BLE Psychiatric: grossly normal mood and affect, speech fluent and appropriate Neurologic: Awake, alert, oriented 3, grossly non-focal.          Labs on Admission:  Basic Metabolic Panel:  Recent Labs Lab 10/20/15 2120  NA 129*  K 3.7  CL 96*  CO2 25  GLUCOSE 84  BUN 14  CREATININE 1.28*  CALCIUM 8.4*   Liver Function Tests:  Recent Labs Lab 10/20/15 2120  AST 14*  ALT 11*  ALKPHOS 40  BILITOT 1.0  PROT 5.2*  ALBUMIN 2.8*   CBC:  Recent Labs Lab 10/20/15 2120  WBC 10.1  NEUTROABS 5.8  HGB 10.3*  HCT 32.7*  MCV 96.2  PLT 249    Radiological Exams on Admission: Dg Chest 2 View  10/20/2015  CLINICAL DATA:  82 year old male with fever and hypotension. Weakness, nausea and fever. EXAM: CHEST  2 VIEW COMPARISON:  07/24/2015 FINDINGS: Cardiomediastinal contours are unchanged. Prominent right upper cart pad. No pulmonary edema, confluent airspace disease, pleural effusion or pneumothorax. Reverse right shoulder arthroplasty in place. IMPRESSION: No acute pulmonary process. Electronically Signed   By: Jeb Levering M.D.   On: 10/20/2015 21:51    EKG: Independently reviewed. Vent. rate 76 BPM PR interval 199 ms QRS duration 155 ms QT/QTc 421/473 ms P-R-T axes 22 -54 33 Sinus rhythm RBBB and LAFB  Assessment/Plan Principal Problem:   Sepsis due to unknown organism. ([place])   Hypertension. Admit to telemetry. Continue gentle IV hydration. Continue IV antibiotics. Follow-up blood cultures and sensitivity.  Active Problems:   Hypertension Not on antihypertensive medications. Continue heart healthy diet. Monitor blood pressure.    Hypothyroidism (acquired) Continue levothyroxine supplementation. Check TSH level.    Anemia, B12 deficiency Monitor hematocrit and hemoglobin. Continue vitamin  B12 supplementation.    Hyponatremia The patient is on  Daily corticosteroids. Continue gentle IV hydration. Monitor sodium level.    Dehydration Continue IV hydration.    Code Status: DO NOT RESUSCITATE/DO NOT INTUBATE. DVT Prophylaxis:Lovenox SQ.  Family Communication:  Disposition Plan:  admitted for IV hydration and antibiotic therapy.   Time spent: About  70 minutes were used in the process of his admission.  Reubin Milan, M.D. [facility] Pager [phone].

## 2015-10-21 NOTE — ED Notes (Signed)
Pt given 2nd urinal

## 2015-10-21 NOTE — ED Notes (Signed)
NURSE DRAWING MORNING LABS

## 2015-10-22 DIAGNOSIS — A419 Sepsis, unspecified organism: Secondary | ICD-10-CM | POA: Diagnosis not present

## 2015-10-22 DIAGNOSIS — L03115 Cellulitis of right lower limb: Secondary | ICD-10-CM

## 2015-10-22 DIAGNOSIS — E039 Hypothyroidism, unspecified: Secondary | ICD-10-CM | POA: Diagnosis not present

## 2015-10-22 DIAGNOSIS — D519 Vitamin B12 deficiency anemia, unspecified: Secondary | ICD-10-CM | POA: Diagnosis not present

## 2015-10-22 LAB — CBC WITH DIFFERENTIAL/PLATELET
BASOS ABS: 0 10*3/uL (ref 0.0–0.1)
BASOS PCT: 0 %
Eosinophils Absolute: 0 10*3/uL (ref 0.0–0.7)
Eosinophils Relative: 0 %
HEMATOCRIT: 36.3 % — AB (ref 39.0–52.0)
Hemoglobin: 11.9 g/dL — ABNORMAL LOW (ref 13.0–17.0)
LYMPHS PCT: 19 %
Lymphs Abs: 1.7 10*3/uL (ref 0.7–4.0)
MCH: 31.5 pg (ref 26.0–34.0)
MCHC: 32.8 g/dL (ref 30.0–36.0)
MCV: 96 fL (ref 78.0–100.0)
Monocytes Absolute: 0.4 10*3/uL (ref 0.1–1.0)
Monocytes Relative: 5 %
NEUTROS ABS: 6.7 10*3/uL (ref 1.7–7.7)
Neutrophils Relative %: 76 %
Platelets: 258 10*3/uL (ref 150–400)
RBC: 3.78 MIL/uL — AB (ref 4.22–5.81)
RDW: 15.5 % (ref 11.5–15.5)
WBC: 8.9 10*3/uL (ref 4.0–10.5)

## 2015-10-22 LAB — URINE CULTURE

## 2015-10-22 LAB — COMPREHENSIVE METABOLIC PANEL
ALBUMIN: 2.7 g/dL — AB (ref 3.5–5.0)
ALT: 12 U/L — AB (ref 17–63)
AST: 15 U/L (ref 15–41)
Alkaline Phosphatase: 43 U/L (ref 38–126)
Anion gap: 9 (ref 5–15)
BUN: 10 mg/dL (ref 6–20)
CHLORIDE: 104 mmol/L (ref 101–111)
CO2: 25 mmol/L (ref 22–32)
Calcium: 9 mg/dL (ref 8.9–10.3)
Creatinine, Ser: 1.3 mg/dL — ABNORMAL HIGH (ref 0.61–1.24)
GFR calc Af Amer: 54 mL/min — ABNORMAL LOW (ref >60)
GFR, EST NON AFRICAN AMERICAN: 47 mL/min — AB (ref >60)
Glucose, Bld: 132 mg/dL — ABNORMAL HIGH (ref 65–99)
POTASSIUM: 4.3 mmol/L (ref 3.5–5.1)
Sodium: 138 mmol/L (ref 135–145)
TOTAL PROTEIN: 5.6 g/dL — AB (ref 6.5–8.1)
Total Bilirubin: 1.2 mg/dL (ref 0.3–1.2)

## 2015-10-22 MED ORDER — SACCHAROMYCES BOULARDII 250 MG PO CAPS
250.0000 mg | ORAL_CAPSULE | Freq: Two times a day (BID) | ORAL | Status: DC
  Administered 2015-10-22 – 2015-10-24 (×5): 250 mg via ORAL
  Filled 2015-10-22 (×6): qty 1

## 2015-10-22 NOTE — Evaluation (Signed)
Occupational Therapy Evaluation Patient Details Name: [name] [name]. MRN: [id number] DOB: 16-Jun-1923 Today's Date: 10/22/2015    History of Present Illness 82 year old Caucasian male with a past medical history of hypertension, arthritis, B-12 deficiency, BPH, RTKA, shoulder replacement, presented from his ILF ([facility])  with the fever of 101F and hypotension with blood pressure in the 80s.   Clinical Impression   Patient presenting with decreased ADL and functional mobility independence secondary to above. Patient independent to mod I PTA. Patient currently functioning at an overall min to mod assist level. Patient will benefit from acute OT to increase overall independence in the areas of ADLs, functional mobility, and overall safety in order to safely discharge to ST SNF (pt prefers [facility]) prior to back to [facility]F at [facility].      Follow Up Recommendations  SNF;Supervision/Assistance - 24 hour    Equipment Recommendations  Other (comment) (TBD)    Recommendations for Other Services   None at this time  Precautions / Restrictions Precautions Precautions: Fall Restrictions Weight Bearing Restrictions: No    Mobility Bed Mobility Overal bed mobility: Needs Assistance Bed Mobility: Supine to Sit     Supine to sit: Min guard     General bed mobility comments: Use of bed rails and HOB slightly elevated  Transfers Overall transfer level: Needs assistance Equipment used: Rolling walker (2 wheeled);None Transfers: Sit to/from Stand Sit to Stand: Min assist General transfer comment: Pt with significant posterior lean during initial sit to stand and requested RW from here.     Balance Overall balance assessment: Needs assistance Sitting-balance support: No upper extremity supported;Feet supported Sitting balance-Leahy Scale: Fair Sitting balance - Comments: posterior lean in sitting Postural control: Posterior lean Standing balance support: During  functional activity;Bilateral upper extremity supported Standing balance-Leahy Scale: Poor Standing balance comment: posterior lean in standing     ADL Overall ADL's : Needs assistance/impaired Eating/Feeding: Set up;Sitting   Grooming: Minimal assistance;Standing Grooming Details (indicate cue type and reason): at sink Upper Body Bathing: Min guard;Sitting   Lower Body Bathing: Minimal assistance;Sit to/from stand   Upper Body Dressing : Min guard;Sitting   Lower Body Dressing: Moderate assistance;Sit to/from stand Lower Body Dressing Details (indicate cue type and reason): posterior lean in standing, relied on UE support using RW Toilet Transfer: Minimal assistance;RW;Ambulation;BSC     Toileting - Clothing Manipulation Details (indicate cue type and reason): did not occur   Tub/Shower Transfer Details (indicate cue type and reason): did not occur  Functional mobility during ADLs: Minimal assistance;Cueing for safety;Rolling walker General ADL Comments: Pt with significant posterior lean in standing and relies on RW for balance and overall safety. Pt able to cross BLEs for LB ADLs. Pt takes increased time to complete tasks.     Pertinent Vitals/Pain Pain Assessment: No/denies pain     Hand Dominance Right   Extremity/Trunk Assessment Upper Extremity Assessment Upper Extremity Assessment: Generalized weakness   Lower Extremity Assessment Lower Extremity Assessment: Defer to PT evaluation   Cervical / Trunk Assessment Cervical / Trunk Assessment: Kyphotic   Communication Communication Communication: No difficulties   Cognition Arousal/Alertness: Awake/alert Behavior During Therapy: WFL for tasks assessed/performed Overall Cognitive Status: Within Functional Limits for tasks assessed              Home Living Family/patient expects to be discharged to:: Unsure (Pt reports [facility]F) Additional Comments: Pt from [place]. Pt reports wanting to go to  SNF at the same facility. Pt  with handicap height toilet seats and a walk-in shower with grab bars, pt states he refuses to use a shower "It just didn't work last time I tried"      Prior Functioning/Environment Level of Independence: Independent        Comments: Pt reports that he used to use a RW, but he does not use one currently    OT Diagnosis: Generalized weakness   OT Problem List: Decreased strength;Decreased activity tolerance;Impaired balance (sitting and/or standing);Decreased safety awareness;Decreased knowledge of use of DME or AE;Decreased knowledge of precautions   OT Treatment/Interventions: Self-care/ADL training;Therapeutic exercise;Energy conservation;DME and/or AE instruction;Therapeutic activities;Patient/family education;Balance training    OT Goals(Current goals can be found in the care plan section) Acute Rehab OT Goals Patient Stated Goal: go to rehab prior to going back to ILF OT Goal Formulation: With patient Time For Goal Achievement: 11/05/15 Potential to Achieve Goals: Good ADL Goals Pt Will Perform Grooming: with supervision;standing Pt Will Perform Lower Body Bathing: with supervision;sit to/from stand Pt Will Perform Lower Body Dressing: with supervision;sit to/from stand Pt Will Transfer to Toilet: with supervision;bedside commode;ambulating Additional ADL Goal #1: Pt will be supervision with functional ambulation/mobility using LRAD  OT Frequency: Min 2X/week   Barriers to D/C: Decreased caregiver support  pt currently lives in [place] During Treatment: Gait belt;Rolling walker Nurse Communication: Mobility status  Activity Tolerance: Patient tolerated treatment well Patient left: in chair;with call bell/phone within reach;with chair alarm set   Time: 1121-1155 OT Time Calculation (min): 34 min Charges:  OT General Charges $OT Visit: 1 Procedure OT Evaluation $OT Eval Low Complexity: 1 Procedure OT  Treatments $Self Care/Home Management : 8-22 mins  Chrys Racer , MS, OTR/L, CLT Pager: [phone]  10/22/2015, 12:12 PM

## 2015-10-22 NOTE — Progress Notes (Signed)
CSW received referral that pt admitted from [facility].   Inappropriate CSW referral as pt from [place].   Please re-consult if pt needing higher level of care.  CSW signing off.   Alison Murray, MSW, LCSW Clinical Social Work 5E coverage  [phone]

## 2015-10-22 NOTE — Evaluation (Signed)
Physical Therapy Evaluation Patient Details Name: [name] [name]. MRN: [id number] DOB: 1923-02-23 Today's Date: 10/22/2015   History of Present Illness  81 year old Caucasian male with a past medical history of hypertension, arthritis, B-12 deficiency, BPH, RTKA, shoulder replacement, presented from his ILF ([facility])  with the fever of 101F and hypotension with blood pressure in the 80s.  Clinical Impression  Pt admitted with above diagnosis. Pt currently with functional limitations due to the deficits listed below (see PT Problem List). Pt ambulated 500' with RW and min A for loss of balance x 3 when he let go of RW with one hand. He would like to DC to healthcare venue at [facility].  Pt will benefit from skilled PT to increase their independence and safety with mobility to allow discharge to the venue listed below.       Follow Up Recommendations SNF;Supervision for mobility/OOB (stand by assist for mobility due to fall risk)    Equipment Recommendations  None recommended by PT    Recommendations for Other Services       Precautions / Restrictions Precautions Precautions: Fall Restrictions Weight Bearing Restrictions: No      Mobility  Bed Mobility Overal bed mobility: Needs Assistance Bed Mobility: Supine to Sit     Supine to sit: Min guard     General bed mobility comments: pt up in reclienr  Transfers Overall transfer level: Needs assistance Equipment used: Rolling walker (2 wheeled) Transfers: Sit to/from Stand Sit to Stand: Min assist;Min guard         General transfer comment: Pt with significant posterior lean during initial sit to stand  Ambulation/Gait Ambulation/Gait assistance: Min guard Ambulation Distance (Feet): 500 Feet Assistive device: Rolling walker (2 wheeled) Gait Pattern/deviations: Step-through pattern;Trunk flexed   Gait velocity interpretation: at or above normal speed for age/gender General Gait Details: posterior LOB x 3  when pt let go of RW with one hand  Stairs            Wheelchair Mobility    Modified Rankin (Stroke Patients Only)       Balance Overall balance assessment: Needs assistance Sitting-balance support: No upper extremity supported;Feet supported Sitting balance-Leahy Scale: Fair Sitting balance - Comments: posterior lean in sitting Postural control: Posterior lean Standing balance support: During functional activity;Bilateral upper extremity supported Standing balance-Leahy Scale: Poor Standing balance comment: posterior lean requiring min A                             Pertinent Vitals/Pain Pain Assessment: No/denies pain    Home Living Family/patient expects to be discharged to:: Unsure (Pt reports [facility])                 Additional Comments: Pt from [place]. Pt reports wanting to go to SNF at the same facility. Pt with handicap height toilet seats and a walk-in shower with grab bars, pt states he refuses to use a shower "It just didn't work last time I tried"    Prior Function Level of Independence: Independent         Comments: uses motorized scooter for long distances, RW for shorter distances     Hand Dominance   Dominant Hand: Right    Extremity/Trunk Assessment   Upper Extremity Assessment: Generalized weakness           Lower Extremity Assessment: Generalized weakness (decreased sensation to light touch B feet; strength 4/5 BLEs)  Cervical / Trunk Assessment: Kyphotic (forward head)  Communication   Communication: No difficulties  Cognition Arousal/Alertness: Awake/alert Behavior During Therapy: WFL for tasks assessed/performed Overall Cognitive Status: Within Functional Limits for tasks assessed                      General Comments      Exercises        Assessment/Plan    PT Assessment Patient needs continued PT services  PT Diagnosis Difficulty walking   PT Problem List  Impaired sensation;Decreased balance  PT Treatment Interventions Gait training;Functional mobility training;Therapeutic activities;Balance training;Patient/family education;Therapeutic exercise   PT Goals (Current goals can be found in the Care Plan section) Acute Rehab PT Goals Patient Stated Goal: go to rehab prior to going back to ILF PT Goal Formulation: With patient Time For Goal Achievement: 11/05/15 Potential to Achieve Goals: Good    Frequency Min 3X/week   Barriers to discharge        Co-evaluation               End of Session Equipment Utilized During Treatment: Gait belt Activity Tolerance: Patient tolerated treatment well;No increased pain Patient left: in chair;with call bell/phone within reach;with chair alarm set           Time: 1210-1238 PT Time Calculation (min) (ACUTE ONLY): 28 min   Charges:   PT Evaluation $PT Eval Moderate Complexity: 1 Procedure PT Treatments $Gait Training: 8-22 mins   PT G Codes:        Blondell Reveal Kistler 10/22/2015, 1:08 PM [phone]

## 2015-10-22 NOTE — Progress Notes (Signed)
[facility] PROGRESS NOTE  [name] [id number] DOB: 1923/07/06 DOA: 10/20/2015  PCP: [name], MD  Brief HPI: 82 year old Caucasian male with a past medical history of hypertension, arthritis, B-12 deficiency, BPH, presented from his nursing home facility with the fever of 101F and hypotension with blood pressure in the 80s. No obvious source of infection was identified. Patient's blood pressure improved with IV fluids. He was hospitalized for further evaluation.  Past medical history:  Past Medical History  Diagnosis Date  . Hypertension   . Arthritis     Status post left total replacement 8 2011  . 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 Hypothyroidism (acquired) 2000  . Anemia, B12 deficiency 2000  . Hyperlipidemia 2003  . Vitamin D deficiency 2009  . History of renal cell carcinoma 1994    Status post right nephrectomy  . BPH (benign prostatic hyperplasia) 2013  . Glaucoma 2007    Status post left trabeculectomy 2007  . History of colon polyps     Colonoscopy 2001  . Allergic rhinitis     Prior allergy shots 20 years  . Colitis 2014  . Anemia   . Cancer ([place])     renal cell ca and skin cancer   . Nocturia   . History of kidney stones   . Difficult intubation 1994    surgery had to be  stopped due to injury to "throat"  . Difficult intubation 1994    no trouble since.  Required nasotracheal intubation  '02 [facility] / ANESTHESIA RECORD FROM 2013 AND 2016 IN EPIC    Consultants: None  Procedures: None  Antibiotics: Vancomycin and Zosyn  Subjective: Patient feels better this morning. States that his fever has subsided. Denies any pain in his abdomen or extremities. No nausea, vomiting.   Objective: Vital Signs  Filed Vitals:   10/21/15 1451 10/21/15 1520 10/21/15 2141 10/22/15 0443  BP:   129/51  141/56  Pulse:   81 74  Temp: 101.5 F (38.6 C) 99.8 F (37.7 C) 98.3 F (36.8 C) 97.7 F (36.5 C)  TempSrc: Rectal Rectal Oral Oral  Resp:   19 20  Height:      Weight:      SpO2:   95% 99%    Intake/Output Summary (Last 24 hours) at 10/22/15 0820 Last data filed at 10/22/15 0550  Gross per 24 hour  Intake 1746.68 ml  Output   3428 ml  Net -1681.32 ml   Filed Weights   10/20/15 2208 10/21/15 0900 10/21/15 1344  Weight: 87.5 kg (192 lb 14.4 oz) 87.091 kg (192 lb) 83.6 kg (184 lb 4.9 oz)    General appearance: alert, cooperative, appears stated age and no distress Resp: clear to auscultation bilaterally Cardio: regular rate and rhythm, S1, S2 normal, no murmur, click, rub or gallop GI: soft, non-tender; bowel sounds normal; no masses,  no organomegaly Extremities: Erythema with the venous stasis dermatitis noted in the right lower extremity. It is less warm to touch compared to yesterday. The left leg is covered in dressing. Neurologic: Awake and alert. No focal neurological deficits.  Lab Results:  Basic Metabolic Panel:  Recent Labs Lab 10/20/15 2120 10/21/15 0612 10/22/15 0515  NA 129* 136 138  K 3.7 3.5 4.3  CL 96* 105 104  CO2 25 25 25   GLUCOSE 84 124* 132*  BUN 14 14 10   CREATININE 1.28* 1.12 1.30*  CALCIUM 8.4* 7.9* 9.0  MG 1.8  --   --   PHOS 2.8  --   --    Liver Function Tests:  Recent Labs Lab 10/20/15 2120 10/21/15 0612 10/22/15 0515  AST 14* 16 15  ALT 11* 11* 12*  ALKPHOS 40 40 43  BILITOT 1.0 0.6 1.2  PROT 5.2* 5.0* 5.6*  ALBUMIN 2.8* 2.5* 2.7*   CBC:  Recent Labs Lab 10/20/15 2120 10/21/15 0612 10/22/15 0515  WBC 10.1 7.9 8.9  NEUTROABS 5.8 4.9 6.7  HGB 10.3* 10.6* 11.9*  HCT 32.7* 33.4* 36.3*  MCV 96.2 96.3 96.0  PLT 249 255 258     Studies/Results: Dg Chest 2 View  10/20/2015  CLINICAL DATA:  82 year old male with fever and hypotension. Weakness, nausea and fever. EXAM: CHEST  2 VIEW COMPARISON:  07/24/2015  FINDINGS: Cardiomediastinal contours are unchanged. Prominent right upper cart pad. No pulmonary edema, confluent airspace disease, pleural effusion or pneumothorax. Reverse right shoulder arthroplasty in place. IMPRESSION: No acute pulmonary process. Electronically Signed   By: Jeb Levering M.D.   On: 10/20/2015 21:51    Medications:  Scheduled: . antiseptic oral rinse  7 mL Mouth Rinse q12n4p  . chlorhexidine  15 mL Mouth Rinse BID  . cholecalciferol  2,000 Units Oral Daily  . enoxaparin (LOVENOX) injection  40 mg Subcutaneous Q24H  . feeding supplement (ENSURE ENLIVE)  237 mL Oral TID BM  . finasteride  5 mg Oral q morning - 10a  . levothyroxine  88 mcg Oral QAC breakfast  . multivitamin  5 mL Oral Daily  . piperacillin-tazobactam (ZOSYN)  IV  3.375 g Intravenous Q8H  . pravastatin  80 mg Oral QPM  . predniSONE  5 mg Oral BID WC  . saccharomyces boulardii  250 mg Oral BID  . sodium chloride flush  3 mL Intravenous Q12H  . vancomycin  1,250 mg Intravenous Q24H  . vitamin B-12  1,000 mcg Oral Daily   Continuous: . 0.9 % NaCl with KCl 20 mEq / L 50 mL/hr at 10/22/15 0400   [id number], hydrALAZINE, ondansetron **OR** ondansetron (ZOFRAN) IV  Assessment/Plan:  Principal Problem:   Sepsis ([place]) Active Problems:   Hypertension   Hypothyroidism (acquired)   Anemia, B12 deficiency   Hyponatremia   Dehydration    Fever and Sepsis due to unknown organism/possible right lower extremity cellulitis No obvious source identified except for the right lower extremity erythema. Unclear if this is new or old. Continue with vancomycin and Zosyn for presumed cellulitis. The erythema and warmth appear to be slightly better today. Influenza PCR is negative. Follow-up on blood cultures. Wound care for the left lower extremity. Recently underwent skin grafting at that site and is followed at the wound Center.  Hyponatremia Corrected with IV fluids.   Hypotension  Blood pressure was  low when he was initially evaluated in the emergency department. Blood pressure has improved with IV fluids. Continue to monitor. Patient is noted to be on prednisone on a chronic basis. Stress dose steroids were considered. However, since blood pressure has improved, will hold off for now.  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 TSH noted to be low. Free T4 is normal. Patient is on low-dose prednisone, which will be continued.  Anemia, B12 deficiency Continue vitamin B12 supplementation.  Dehydration Improved with IV hydration.  DVT Prophylaxis: Lovenox    Code Status: DO NOT RESUSCITATE  Family Communication:  Discussed with the patient. No family at bedside  Disposition Plan: Continue management as outlined above. PT and OT evaluation is pending.    LOS: 1 day   [place] [facility] Pager [phone] 10/22/2015, 8:20 AM  If 7PM-7AM, please contact night-coverage at [email or web address], password [facility]

## 2015-10-23 DIAGNOSIS — L03115 Cellulitis of right lower limb: Secondary | ICD-10-CM | POA: Diagnosis not present

## 2015-10-23 DIAGNOSIS — D519 Vitamin B12 deficiency anemia, unspecified: Secondary | ICD-10-CM | POA: Diagnosis not present

## 2015-10-23 DIAGNOSIS — A419 Sepsis, unspecified organism: Secondary | ICD-10-CM | POA: Diagnosis not present

## 2015-10-23 DIAGNOSIS — E039 Hypothyroidism, unspecified: Secondary | ICD-10-CM | POA: Diagnosis not present

## 2015-10-23 LAB — BASIC METABOLIC PANEL
ANION GAP: 7 (ref 5–15)
BUN: 13 mg/dL (ref 6–20)
CALCIUM: 8.8 mg/dL — AB (ref 8.9–10.3)
CO2: 27 mmol/L (ref 22–32)
CREATININE: 1.42 mg/dL — AB (ref 0.61–1.24)
Chloride: 105 mmol/L (ref 101–111)
GFR calc Af Amer: 49 mL/min — ABNORMAL LOW (ref >60)
GFR, EST NON AFRICAN AMERICAN: 42 mL/min — AB (ref >60)
GLUCOSE: 111 mg/dL — AB (ref 65–99)
Potassium: 4.1 mmol/L (ref 3.5–5.1)
Sodium: 139 mmol/L (ref 135–145)

## 2015-10-23 LAB — CBC
HCT: 33.9 % — ABNORMAL LOW (ref 39.0–52.0)
Hemoglobin: 10.7 g/dL — ABNORMAL LOW (ref 13.0–17.0)
MCH: 30.6 pg (ref 26.0–34.0)
MCHC: 31.6 g/dL (ref 30.0–36.0)
MCV: 96.9 fL (ref 78.0–100.0)
PLATELETS: 289 10*3/uL (ref 150–400)
RBC: 3.5 MIL/uL — ABNORMAL LOW (ref 4.22–5.81)
RDW: 15.6 % — AB (ref 11.5–15.5)
WBC: 10.2 10*3/uL (ref 4.0–10.5)

## 2015-10-23 MED ORDER — DOXYCYCLINE HYCLATE 100 MG PO TABS
100.0000 mg | ORAL_TABLET | Freq: Two times a day (BID) | ORAL | Status: DC
  Administered 2015-10-23 – 2015-10-24 (×3): 100 mg via ORAL
  Filled 2015-10-23 (×5): qty 1

## 2015-10-23 MED ORDER — LEVOFLOXACIN 750 MG PO TABS
750.0000 mg | ORAL_TABLET | ORAL | Status: DC
  Administered 2015-10-23: 750 mg via ORAL
  Filled 2015-10-23: qty 1

## 2015-10-23 NOTE — Clinical Social Work Note (Signed)
Clinical Social Work Assessment  Patient Details  Name: [name] [name]. MRN: [id number] Date of Birth: 31-Jul-1923  Date of referral:  10/23/15               Reason for consult:  Facility Placement                Permission sought to share information with:  Facility Art therapist granted to share information::  Yes, Verbal Permission Granted  Name::        Agency::     Relationship::     Contact Information:     Housing/Transportation Living arrangements for the past 2 months:  [place] of Information:  Patient Patient Interpreter Needed:  None Criminal Activity/Legal Involvement Pertinent to Current Situation/Hospitalization:    Significant Relationships:  Adult Children Lives with:  Self Do you feel safe going back to the place where you live?    Need for family participation in patient care:  No (Coment)  Care giving concerns:  No caregiver   Facilities manager / plan:  CSW met with pt at bedside to discuss discharge needs.  CSW spoke with [facility] CSW /[place] while at bedside and they reported that they will have a bed for pt tomorrow.  Per Brenas they do not need a pasaar number and do not need prior authorization for rehab. [facility] needs an order for PT and they will obtain authorization.  CSW provided paperwork through the [facility] and will follow up with pt until discharge   Employment status:  Retired Insurance underwriter information:  Other (Comment Required) (healthte[employer] advantage) PT Recommendations:  [facility] / Referral to community resources:     Patient/Family's Response to care:  Pt discussed living at [facility]	[place] independent for past 17 years.   He was on the phone with [facility] CSW when [name] [name] came into the room and provided the phone for CSW and CSW to coordinate pt discharge.  Pt declined any family involvement at this time.   Patient/Family's Understanding of and Emotional  Response to Diagnosis, Current Treatment, and Prognosis: Pt appears to be in good spirits and happy to be coordinating his return to [facility]	[place].    Emotional Assessment Appearance:  Appears stated age Attitude/Demeanor/Rapport:  Apprehensive Affect (typically observed):  Anxious Orientation:  Oriented to Self, Oriented to Place, Oriented to  Time, Oriented to Situation Alcohol / Substance use:    Psych involvement (Current and /or in the community):     Discharge Needs  Concerns to be addressed:    Readmission within the last 30 days:    Current discharge risk:    Barriers to Discharge:  No Barriers Identified   [name] Jews, LCSW 10/23/2015, 12:25 PM

## 2015-10-23 NOTE — Progress Notes (Signed)
Pharmacy Antibiotic Note  [name] [name]. is a 82 y.o. male admitted on 10/20/2015 with sepsis, fever, hypotension, unknown source of infection.  After an initial coarse of vanc and zosyn pharmacy has been consulted for levaquin po dosing.  Plan: levaquin 750mg  po q48h for CrCl < 49mls/min  Height: 5\' 11"  (180.3 cm) Weight: 184 lb 4.9 oz (83.6 kg) IBW/kg (Calculated) : 75.3  Temp (24hrs), Avg:98.2 F (36.8 C), Min:97.9 F (36.6 C), Max:98.6 F (37 C)   Recent Labs Lab 10/20/15 2115 10/20/15 2120 10/21/15 0612 10/22/15 0515 10/23/15 0539  WBC  --  10.1 7.9 8.9 10.2  CREATININE  --  1.28* 1.12 1.30* 1.42*  LATICACIDVEN 0.91  --   --   --   --     Estimated Creatinine Clearance: 36.8 mL/min (by C-G formula based on Cr of 1.42).    Allergies  Allergen Reactions  . Percocet [Oxycodone-Acetaminophen] Other (See Comments)    Just doesn't like it  . Diovan [Valsartan] Rash    Antimicrobials this admission:  Zosyn 2/21 >> 2/24 Vancomycin 2/21 >> 2/24 Dose adjustments this admission: none  Microbiology results: 2/21 BCx: NGTD 2/21 UCx: multiple species, suggest recollection  Thank you for allowing pharmacy to be a part of this patient's care.  Dolly Rias RPh 10/23/2015, 8:32 AM Pager [phone]

## 2015-10-23 NOTE — NC FL2 (Signed)
[place] LEVEL OF CARE SCREENING TOOL     IDENTIFICATION  Patient Name: [name] [name]. Birthdate: August 23, 1923 Sex: male Admission Date (Current Location): 10/20/2015  [facility] and [place] Number:  Herbalist and Address:  [facility],  [place] [address], [place]      Provider Number: [id number]  Attending Physician Name and Address:  Bonnielee Haff, MD  Relative Name and Phone Number:       Current Level of Care: Hospital Recommended Level of Care: [place] Prior Approval Number:    Date Approved/Denied:   PASRR Number:    Discharge Plan: SNF    Current Diagnoses: Patient Active Problem List   Diagnosis Date Noted  . Sepsis ([place]) 10/21/2015  . Febrile illness   . S/P total knee arthroplasty 07/13/2015  . OA (osteoarthritis) of knee 06/29/2015  . Lateral meniscal tear 02/05/2015  . Pneumonia 10/28/2013  . Hyponatremia 10/20/2013  . Volume depletion 10/20/2013  . Abdominal pain 10/20/2013  . Pancreatitis 10/20/2013  . Dehydration 10/20/2013  . Weakness 10/19/2013  . Hypertension   . 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 Hypothyroidism (acquired)   . Anemia, B12 deficiency   . Colitis   . Arthritis of shoulder region, right 12/15/2011    Orientation RESPIRATION BLADDER Height & Weight     Self, Time, Situation, Place  Normal Incontinent Weight: 184 lb 4.9 oz (83.6 kg) Height:  5\' 11"  (180.3 cm)  BEHAVIORAL SYMPTOMS/MOOD NEUROLOGICAL BOWEL NUTRITION STATUS      Incontinent Diet (heart healthy)  AMBULATORY STATUS COMMUNICATION OF NEEDS Skin   Limited Assist Verbally Normal                       Personal Care Assistance Level of Assistance  Bathing, Dressing Bathing Assistance: Limited assistance   Dressing Assistance: Limited assistance     Functional Limitations Info             SPECIAL CARE FACTORS FREQUENCY                       Contractures       Additional Factors Info  Code Status, Allergies Code Status Info: DNR Allergies Info: Percocet, Diovan           Current Medications (10/23/2015):  This is the current hospital active medication list Current Facility-Administered Medications  Medication Dose Route Frequency Provider Last Rate Last Dose  . acetaminophen (TYLENOL) tablet 650 mg  650 mg Oral Q6H PRN Bonnielee Haff, MD   650 mg at 10/21/15 1513  . antiseptic oral rinse (CPC / CETYLPYRIDINIUM CHLORIDE 0.05%) solution 7 mL  7 mL Mouth Rinse q12n4p Bonnielee Haff, MD   7 mL at 10/23/15 1200  . chlorhexidine (PERIDEX) 0.12 % solution 15 mL  15 mL Mouth Rinse BID Bonnielee Haff, MD   15 mL at 10/23/15 1039  . cholecalciferol (VITAMIN D) tablet 2,000 Units  2,000 Units Oral Daily Reubin Milan, MD   2,000 Units at 10/23/15 1039  . doxycycline (VIBRA-TABS) tablet 100 mg  100 mg Oral BID Bonnielee Haff, MD   100 mg at 10/23/15 1043  . enoxaparin (LOVENOX) injection 40 mg  40 mg Subcutaneous Q24H Reubin Milan, MD   40 mg at 10/22/15 1019  . feeding supplement (ENSURE ENLIVE) (ENSURE ENLIVE) liquid 237 mL  237 mL Oral TID BM Reubin Milan, MD   237 mL at 10/23/15  1000  . finasteride (PROSCAR) tablet 5 mg  5 mg Oral q morning - 10a Reubin Milan, MD   5 mg at 10/23/15 1038  . hydrALAZINE (APRESOLINE) injection 5 mg  5 mg Intravenous Q6H PRN Bonnielee Haff, MD      . levofloxacin (LEVAQUIN) tablet 750 mg  750 mg Oral Q48H Angela Adam, RPH   750 mg at 10/23/15 1043  . levothyroxine (SYNTHROID, LEVOTHROID) tablet 88 mcg  88 mcg Oral QAC breakfast Reubin Milan, MD   88 mcg at 10/23/15 0848  . multivitamin liquid 5 mL  5 mL Oral Daily Reubin Milan, MD   5 mL at 10/23/15 1038  . ondansetron (ZOFRAN) tablet 4 mg  4 mg Oral Q6H PRN Reubin Milan, MD       Or  . ondansetron [facility]) injection 4 mg  4 mg Intravenous Q6H PRN Reubin Milan, MD   4 mg at 10/21/15 1450  . pravastatin (PRAVACHOL)  tablet 80 mg  80 mg Oral QPM Reubin Milan, MD   80 mg at 10/22/15 1820  . predniSONE (DELTASONE) tablet 5 mg  5 mg Oral BID WC Reubin Milan, MD   5 mg at 10/23/15 0848  . saccharomyces boulardii (FLORASTOR) capsule 250 mg  250 mg Oral BID Bonnielee Haff, MD   250 mg at 10/23/15 1038  . sodium chloride flush (NS) 0.9 % injection 3 mL  3 mL Intravenous Q12H Reubin Milan, MD   3 mL at 10/22/15 2150  . vitamin B-12 (CYANOCOBALAMIN) tablet 1,000 mcg  1,000 mcg Oral Daily Reubin Milan, MD   1,000 mcg at 10/23/15 1039     Discharge Medications: Please see discharge summary for a list of discharge medications.  Relevant Imaging Results:  Relevant Lab Results:   Additional Information    Carlean Jews, LCSW

## 2015-10-23 NOTE — Progress Notes (Signed)
Physical Therapy Treatment Patient Details Name: [name] [name]. MRN: [id number] DOB: 05-17-1923 Today's Date: 10/23/2015    History of Present Illness 82 year old Caucasian male with a past medical history of hypertension, arthritis, B-12 deficiency, BPH, RTKA, shoulder replacement, presented from his ILF ([facility])  with the fever of 101F and hypotension with blood pressure in the 80s.    PT Comments    Pt OOB very conversive.  Assisted to bathroom for a BM.Marland Kitchen Assisted with amb a great distance in hallway.  Slight unsteady gait and mod posterior lean with initial sit to stand.  Delayed corrective response.  HIGH FALL RISK.   Follow Up Recommendations  SNF ([facility])     Equipment Recommendations  None recommended by PT    Recommendations for Other Services       Precautions / Restrictions Precautions Precautions: Fall Restrictions Weight Bearing Restrictions: No    Mobility  Bed Mobility               General bed mobility comments: pt up in reclienr  Transfers Overall transfer level: Needs assistance Equipment used: Rolling walker (2 wheeled) Transfers: Sit to/from Stand           General transfer comment: initial posterior LOB slow to rise  Ambulation/Gait Ambulation/Gait assistance: Min guard Ambulation Distance (Feet): 220 Feet Assistive device: Rolling walker (2 wheeled) Gait Pattern/deviations: Step-through pattern Gait velocity: decreased   General Gait Details: increased time   Stairs            Wheelchair Mobility    Modified Rankin (Stroke Patients Only)       Balance                                    Cognition Arousal/Alertness: Awake/alert Behavior During Therapy: WFL for tasks assessed/performed Overall Cognitive Status: Within Functional Limits for tasks assessed                      Exercises      General Comments        Pertinent Vitals/Pain Pain Assessment: No/denies pain     Home Living                      Prior Function            PT Goals (current goals can now be found in the care plan section) Progress towards PT goals: Progressing toward goals    Frequency  Min 3X/week    PT Plan Current plan remains appropriate    Co-evaluation             End of Session Equipment Utilized During Treatment: Gait belt Activity Tolerance: Patient tolerated treatment well;No increased pain Patient left: in chair;with call bell/phone within reach;with chair alarm set     Time: 1110-1135 PT Time Calculation (min) (ACUTE ONLY): 25 min  Charges:  $Gait Training: 8-22 mins $Therapeutic Activity: 8-22 mins                    G Codes:      Rica Koyanagi  PTA [facility]  Acute  Rehab Pager      [phone]

## 2015-10-23 NOTE — Progress Notes (Signed)
[facility] PROGRESS NOTE  [name] [id number] DOB: [birth year] DOA: 10/20/2015  PCP: [name], MD  Brief HPI: 82 year old Caucasian male with a past medical history of hypertension, arthritis, B-12 deficiency, BPH, presented from his nursing home facility with the fever of 101F and hypotension with blood pressure in the 80s. No obvious source of infection was identified initially. Patient's blood pressure improved with IV fluids. He was hospitalized for further evaluation.  Past medical history:  Past Medical History  Diagnosis Date  . Hypertension   . Arthritis     Status post left total replacement 8 2011  . 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 Hypothyroidism (acquired) 2000  . Anemia, B12 deficiency 2000  . Hyperlipidemia 2003  . Vitamin D deficiency 2009  . History of renal cell carcinoma 1994    Status post right nephrectomy  . BPH (benign prostatic hyperplasia) 2013  . Glaucoma 2007    Status post left trabeculectomy 2007  . History of colon polyps     Colonoscopy 2001  . Allergic rhinitis     Prior allergy shots 20 years  . Colitis 2014  . Anemia   . Cancer ([place])     renal cell ca and skin cancer   . Nocturia   . History of kidney stones   . Difficult intubation 1994    surgery had to be  stopped due to injury to "throat"  . Difficult intubation 1994    no trouble since.  Required nasotracheal intubation  '02 [facility] / ANESTHESIA RECORD FROM 2013 AND 2016 IN EPIC    Consultants: None  Procedures: None  Antibiotics: Vancomycin and Zosyn, changed over to doxycycline and Levaquin. On 2/24  Subjective: Patient continues to feel better. He feels that the right leg is looking better. Denies any nausea, vomiting.   Objective: Vital Signs  Filed Vitals:   10/22/15 0443 10/22/15 1415 10/22/15 2310  10/23/15 0638  BP: 141/56 156/60 136/56 147/60  Pulse: 74 78 70 62  Temp: 97.7 F (36.5 C) 98.6 F (37 C) 97.9 F (36.6 C) 98.1 F (36.7 C)  TempSrc: Oral Oral Oral Oral  Resp: 20 18 18 18   Height:      Weight:      SpO2: 99% 98% 96% 99%    Intake/Output Summary (Last 24 hours) at 10/23/15 0811 Last data filed at 10/23/15 0640  Gross per 24 hour  Intake    480 ml  Output   1875 ml  Net  -1395 ml   Filed Weights   10/20/15 2208 10/21/15 0900 10/21/15 1344  Weight: 87.5 kg (192 lb 14.4 oz) 87.091 kg (192 lb) 83.6 kg (184 lb 4.9 oz)    General appearance: alert, cooperative, appears stated age and no distress Resp: clear to auscultation bilaterally Cardio: regular rate and rhythm, S1, S2 normal, no murmur, click, rub or gallop GI: soft, non-tender; bowel sounds normal; no masses,  no organomegaly Extremities: Erythema with the venous stasis dermatitis noted in the right lower extremity. It is less warm to touch and appears to be less erythematous compared to yesterday. The left leg is covered in dressing. Neurologic: Awake and alert. No focal neurological deficits.  Lab Results:  Basic Metabolic Panel:  Recent Labs Lab 10/20/15 2120 10/21/15 0612 10/22/15 0515 10/23/15 0539  NA 129* 136 138 139  K 3.7  3.5 4.3 4.1  CL 96* 105 104 105  CO2 25 25 25 27   GLUCOSE 84 124* 132* 111*  BUN 14 14 10 13   CREATININE 1.28* 1.12 1.30* 1.42*  CALCIUM 8.4* 7.9* 9.0 8.8*  MG 1.8  --   --   --   PHOS 2.8  --   --   --    Liver Function Tests:  Recent Labs Lab 10/20/15 2120 10/21/15 0612 10/22/15 0515  AST 14* 16 15  ALT 11* 11* 12*  ALKPHOS 40 40 43  BILITOT 1.0 0.6 1.2  PROT 5.2* 5.0* 5.6*  ALBUMIN 2.8* 2.5* 2.7*   CBC:  Recent Labs Lab 10/20/15 2120 10/21/15 0612 10/22/15 0515 10/23/15 0539  WBC 10.1 7.9 8.9 10.2  NEUTROABS 5.8 4.9 6.7  --   HGB 10.3* 10.6* 11.9* 10.7*  HCT 32.7* 33.4* 36.3* 33.9*  MCV 96.2 96.3 96.0 96.9  PLT 249 255 258 289      Studies/Results: No results found.  Medications:  Scheduled: . antiseptic oral rinse  7 mL Mouth Rinse q12n4p  . chlorhexidine  15 mL Mouth Rinse BID  . cholecalciferol  2,000 Units Oral Daily  . doxycycline  100 mg Oral Q12H  . enoxaparin (LOVENOX) injection  40 mg Subcutaneous Q24H  . feeding supplement (ENSURE ENLIVE)  237 mL Oral TID BM  . finasteride  5 mg Oral q morning - 10a  . levothyroxine  88 mcg Oral QAC breakfast  . multivitamin  5 mL Oral Daily  . pravastatin  80 mg Oral QPM  . predniSONE  5 mg Oral BID WC  . saccharomyces boulardii  250 mg Oral BID  . sodium chloride flush  3 mL Intravenous Q12H  . vitamin B-12  1,000 mcg Oral Daily   Continuous:   [id number], hydrALAZINE, ondansetron **OR** ondansetron (ZOFRAN) IV  Assessment/Plan:  Principal Problem:   Sepsis ([place]) Active Problems:   Hypertension   Hypothyroidism (acquired)   Anemia, B12 deficiency   Hyponatremia   Dehydration    Fever and Sepsis due to unknown organism/possible right lower extremity cellulitis No obvious source identified except for the right lower extremity erythema. So his presentation could have been due to cellulitis which is improving. He was given vancomycin and Zosyn. Cultures are all negative so far. Change to oral doxycycline and Levaquin today.Influenza PCR is negative. Follow-up on blood cultures. Wound care for the left lower extremity. Recently underwent skin grafting at that site and is followed at the wound Center.  Hyponatremia Corrected with IV fluids.   Hypotension  Blood pressure was low when he was initially evaluated in the emergency department. Blood pressure has improved with IV fluids. Continue to monitor. Patient is noted to be on prednisone on a chronic basis. He did not require stress dose steroids.  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 TSH noted to be low. Free T4 is  normal. Patient is on low-dose prednisone, which will be continued.  Anemia, B12 deficiency Continue vitamin B12 supplementation.  Dehydration Improved with IV hydration.  DVT Prophylaxis: Lovenox    Code Status: DO NOT RESUSCITATE  Family Communication: Discussed with the patient. No family at bedside  Disposition Plan: Continue management as outlined above. PT and OT recommend SNF. Social worker has been consulted. Anticipate discharge tomorrow.    LOS: 2 days   [place] [facility] Pager [phone] 10/23/2015, 8:11 AM  If 7PM-7AM, please contact night-coverage at [email or web address], password [facility]

## 2015-10-23 NOTE — Clinical Social Work Note (Signed)
Weekend CSW please call nurse supervisor at [facility] at [phone] if pt is ready for discharge over the weekend.  Dede Query, LCSW [place] Worker - Weekend Coverage cell #: [phone]

## 2015-10-24 DIAGNOSIS — L03115 Cellulitis of right lower limb: Secondary | ICD-10-CM | POA: Diagnosis not present

## 2015-10-24 DIAGNOSIS — E039 Hypothyroidism, unspecified: Secondary | ICD-10-CM | POA: Diagnosis not present

## 2015-10-24 DIAGNOSIS — A419 Sepsis, unspecified organism: Secondary | ICD-10-CM | POA: Diagnosis not present

## 2015-10-24 DIAGNOSIS — D519 Vitamin B12 deficiency anemia, unspecified: Secondary | ICD-10-CM | POA: Diagnosis not present

## 2015-10-24 MED ORDER — DOXYCYCLINE HYCLATE 100 MG PO TABS: 100.0000 mg | ORAL_TABLET | Freq: Two times a day (BID) | ORAL | Status: DC

## 2015-10-24 MED ORDER — LEVOFLOXACIN 750 MG PO TABS: 750.0000 mg | ORAL_TABLET | ORAL | Status: DC

## 2015-10-24 MED ORDER — ACETAMINOPHEN 325 MG PO TABS: 650.0000 mg | ORAL_TABLET | Freq: Four times a day (QID) | ORAL | Status: DC | PRN

## 2015-10-24 MED ORDER — SACCHAROMYCES BOULARDII 250 MG PO CAPS: 250.0000 mg | ORAL_CAPSULE | Freq: Two times a day (BID) | ORAL | Status: DC

## 2015-10-24 NOTE — Care Management Important Message (Signed)
Important Message  Patient Details  Name: [name] [name]. MRN: [id number] Date of Birth: April 27, 1923   Medicare Important Message Given:  Yes    Erenest Rasher, RN 10/24/2015, 9:43 AM

## 2015-10-24 NOTE — Clinical Social Work Note (Signed)
Patient to be d/c'ed today to [facility].  Patient and family agreeable to plans will transport via family's car RN to call report to [phone].  Evette Cristal, MSW, [place]

## 2015-10-24 NOTE — Discharge Instructions (Signed)

## 2015-10-24 NOTE — Discharge Summary (Signed)
[facility]  Physician Discharge Summary   Patient ID: [name] [name]. MRN: [id number] DOB/AGE: March 28, 1923 82 y.o.  Admit date: 10/20/2015 Discharge date: 10/24/2015  PCP: [name], MD  DISCHARGE DIAGNOSES:  Principal Problem:   Cellulitis of leg, right Active Problems:   Hypertension   Hypothyroidism (acquired)   Anemia, B12 deficiency   Hyponatremia   Dehydration   Sepsis ([place])   RECOMMENDATIONS FOR OUTPATIENT FOLLOW UP: 1. Dressing changes to left leg as before. 2. Keep right leg elevated as much as possible. 3. CBC and BMET in 1 week. 4. Patient being discharged to SNF for short term rehab.  DISCHARGE CONDITION: fair  Diet recommendation: Heart Healthy  [facility] Weights   10/20/15 2208 10/21/15 0900 10/21/15 1344  Weight: 87.5 kg (192 lb 14.4 oz) 87.091 kg (192 lb) 83.6 kg (184 lb 4.9 oz)    INITIAL HISTORY: 82 year old Caucasian male with a past medical history of hypertension, arthritis, B-12 deficiency, BPH, presented from his nursing home facility with the fever of 101F and hypotension with blood pressure in the 80s. No obvious source of infection was identified initially. Patient's blood pressure improved with IV fluids. He was hospitalized for further evaluation.  Consultations:  None  Procedures:  None  HOSPITAL COURSE:   Fever and Sepsis due to unknown organism/possible right lower extremity cellulitis No obvious source identified except for the right lower extremity erythema. So his presentation could have been due to cellulitis which is improving. He was given vancomycin and Zosyn. Cultures were all negative. Antibiotics were changed to oral doxycycline and Levaquin.  Influenza PCR is negative. Continue wound care for the left lower extremity. Recently underwent skin grafting at that site and is followed at the wound Center. Patient sustained injury to both his legs in December. Unlikely to be directly related to that injury but  perhaps indirectly due to skin breakdown. RLE Cellulitis is improving. He should continue wound care to left LE as before.  Hyponatremia Corrected with IV fluids.   Hypotension  Blood pressure was low when he was initially evaluated in the emergency department. Blood pressure has improved with IV fluids. Continue to monitor. Patient is noted to be on prednisone on a chronic basis. He did not require stress dose steroids.  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 TSH noted to be low. Free T4 is normal. Patient is on low-dose prednisone, which will be continued.  Anemia, B12 deficiency Continue vitamin B12 supplementation.  Dehydration Improved with IV hydration.  Overall much improved. He was seen by PT and OT. SNF was recommended. He is stable for discharge today.     PERTINENT LABS:  The results of significant diagnostics from this hospitalization (including imaging, microbiology, ancillary and laboratory) are listed below for reference.    Microbiology: Recent Results (from the past 240 hour(s))  Blood Culture (routine x 2)     Status: None (Preliminary result)   Collection Time: 10/20/15  9:20 PM  Result Value Ref Range Status   Specimen Description BLOOD BLOOD LEFT FOREARM  Final   Special Requests BOTTLES DRAWN AEROBIC AND ANAEROBIC 5ML  Final   Culture   Final    NO GROWTH 2 DAYS Performed at [facility]    Report Status PENDING  Incomplete  Urine culture     Status: None   Collection Time: 10/20/15  9:20 PM  Result Value Ref Range Status   Specimen Description URINE, RANDOM  Final   Special Requests NONE  Final   Culture   Final    MULTIPLE SPECIES PRESENT, SUGGEST RECOLLECTION Performed at [facility]    Report Status 10/22/2015 FINAL  Final     Labs: Basic Metabolic Panel:  Recent Labs Lab 10/20/15 2120 10/21/15 0612 10/22/15 0515 10/23/15 0539  NA 129* 136 138 139    K 3.7 3.5 4.3 4.1  CL 96* 105 104 105  CO2 25 25 25 27   GLUCOSE 84 124* 132* 111*  BUN 14 14 10 13   CREATININE 1.28* 1.12 1.30* 1.42*  CALCIUM 8.4* 7.9* 9.0 8.8*  MG 1.8  --   --   --   PHOS 2.8  --   --   --    Liver Function Tests:  Recent Labs Lab 10/20/15 2120 10/21/15 0612 10/22/15 0515  AST 14* 16 15  ALT 11* 11* 12*  ALKPHOS 40 40 43  BILITOT 1.0 0.6 1.2  PROT 5.2* 5.0* 5.6*  ALBUMIN 2.8* 2.5* 2.7*   CBC:  Recent Labs Lab 10/20/15 2120 10/21/15 0612 10/22/15 0515 10/23/15 0539  WBC 10.1 7.9 8.9 10.2  NEUTROABS 5.8 4.9 6.7  --   HGB 10.3* 10.6* 11.9* 10.7*  HCT 32.7* 33.4* 36.3* 33.9*  MCV 96.2 96.3 96.0 96.9  PLT 249 255 258 289     IMAGING STUDIES Dg Chest 2 View  10/20/2015  CLINICAL DATA:  82 year old male with fever and hypotension. Weakness, nausea and fever. EXAM: CHEST  2 VIEW COMPARISON:  07/24/2015 FINDINGS: Cardiomediastinal contours are unchanged. Prominent right upper cart pad. No pulmonary edema, confluent airspace disease, pleural effusion or pneumothorax. Reverse right shoulder arthroplasty in place. IMPRESSION: No acute pulmonary process. Electronically Signed   By: Jeb Levering M.D.   On: 10/20/2015 21:51    DISCHARGE EXAMINATION: Filed Vitals:   10/23/15 [id number] 10/23/15 1403 10/23/15 2222 10/24/15 0650  BP: 147/60 154/76 155/70 125/56  Pulse: 62 74 70 68  Temp: 98.1 F (36.7 C) 98.2 F (36.8 C) 98.2 F (36.8 C) 98.8 F (37.1 C)  TempSrc: Oral Oral Oral Oral  Resp: 18 20 18 20   Height:      Weight:      SpO2: 99% 92% 99% 95%   General appearance: alert, cooperative, appears stated age and no distress Resp: clear to auscultation bilaterally Cardio: regular rate and rhythm, S1, S2 normal, no murmur, click, rub or gallop GI: soft, non-tender; bowel sounds normal; no masses,  no organomegaly Extremities: RLE erythema is improving.   DISPOSITION: SNF  Discharge Instructions    Call MD for:  difficulty breathing, headache  or visual disturbances    Complete by:  As directed      Call MD for:  extreme fatigue    Complete by:  As directed      Call MD for:  persistant dizziness or light-headedness    Complete by:  As directed      Call MD for:  persistant nausea and vomiting    Complete by:  As directed      Call MD for:  severe uncontrolled pain    Complete by:  As directed      Call MD for:  temperature >100.4    Complete by:  As directed      Diet - low sodium heart healthy    Complete by:  As directed      Discharge instructions    Complete by:  As directed   Please follow up with wound care clinic for left leg wound.  Dressing changes to left leg as before.   You were cared for by a hospitalist during your hospital stay. If you have any questions about your discharge medications or the care you received while you were in the hospital after you are discharged, you can call the unit and asked to speak with the hospitalist on call if the hospitalist that took care of you is not available. Once you are discharged, your primary care physician will handle any further medical issues. Please note that NO REFILLS for any discharge medications will be authorized once you are discharged, as it is imperative that you return to your primary care physician (or establish a relationship with a primary care physician if you do not have one) for your aftercare needs so that they can reassess your need for medications and monitor your lab values. If you do not have a primary care physician, you can call [phone] for a physician referral.     Increase activity slowly    Complete by:  As directed            ALLERGIES:  Allergies  Allergen Reactions  . Percocet [Oxycodone-Acetaminophen] Other (See Comments)    Just doesn't like it  . Diovan [Valsartan] Rash     Current Discharge Medication List    START taking these medications   Details  acetaminophen (TYLENOL) 325 MG tablet Take 2 tablets (650 mg total) by mouth  every 6 (six) hours as needed for mild pain or fever.    doxycycline (VIBRA-TABS) 100 MG tablet Take 1 tablet (100 mg total) by mouth 2 (two) times daily. For 12 more days Qty: 24 tablet    levofloxacin (LEVAQUIN) 750 MG tablet Take 1 tablet (750 mg total) by mouth every other day. For 12 more days (6 more doses)    saccharomyces boulardii (FLORASTOR) 250 MG capsule Take 1 capsule (250 mg total) by mouth 2 (two) times daily. For 2 weeks      CONTINUE these medications which have NOT CHANGED   Details  co-enzyme Q-10 30 MG capsule Take 30 mg by mouth daily.    finasteride (PROSCAR) 5 MG tablet Take 5 mg by mouth every morning.     levothyroxine (SYNTHROID, LEVOTHROID) 88 MCG tablet Take 88 mcg by mouth daily.    Multiple Vitamins-Minerals (MULTIVITAMIN & MINERAL PO) Take 1 tablet by mouth daily.    pravastatin (PRAVACHOL) 80 MG tablet Take 80 mg by mouth every evening.     predniSONE (DELTASONE) 2.5 MG tablet Take 2.5-5 mg by mouth 2 (two) times daily with a meal. Take 2 tablets (5 mg) in the am and Take 1 tablet (2.5 mg) in the evening.    Testosterone Cypionate (DEPO-TESTOSTERONE IM) Inject into the muscle. Patient states he takes .15mg  every week per patient    vitamin B-12 (CYANOCOBALAMIN) 1000 MCG tablet Take 1,000 mcg by mouth daily.    VITAMIN D, ERGOCALCIFEROL, PO Take 2,000 Units by mouth daily.       STOP taking these medications     bisacodyl (DULCOLAX) 10 MG suppository      metoCLOPramide (REGLAN) 5 MG tablet      ondansetron (ZOFRAN) 4 MG tablet        Follow-up Information    Follow up with [name],MICHAEL D, MD. Schedule an appointment as soon as possible for a visit in 1 week.   Specialty:  Endocrinology   Why:  post hospitalization follow up   Contact information:   [employer]  [place] [address] [phone]       TOTAL DISCHARGE TIME: 35 mins  [facility] Pager [phone]  10/24/2015, 8:14 AM

## 2015-10-26 ENCOUNTER — Other Ambulatory Visit: Payer: Self-pay | Admitting: *Deleted

## 2015-10-26 DIAGNOSIS — I87332 Chronic venous hypertension (idiopathic) with ulcer and inflammation of left lower extremity: Secondary | ICD-10-CM | POA: Diagnosis not present

## 2015-10-26 DIAGNOSIS — S7012XA Contusion of left thigh, initial encounter: Secondary | ICD-10-CM | POA: Diagnosis not present

## 2015-10-26 DIAGNOSIS — W19XXXA Unspecified fall, initial encounter: Secondary | ICD-10-CM | POA: Diagnosis not present

## 2015-10-26 DIAGNOSIS — I10 Essential (primary) hypertension: Secondary | ICD-10-CM | POA: Diagnosis not present

## 2015-10-26 DIAGNOSIS — H409 Unspecified glaucoma: Secondary | ICD-10-CM | POA: Diagnosis not present

## 2015-10-26 DIAGNOSIS — S81812A Laceration without foreign body, left lower leg, initial encounter: Secondary | ICD-10-CM | POA: Diagnosis not present

## 2015-10-26 DIAGNOSIS — L97221 Non-pressure chronic ulcer of left calf limited to breakdown of skin: Secondary | ICD-10-CM | POA: Diagnosis not present

## 2015-10-26 DIAGNOSIS — Y9289 Other specified places as the place of occurrence of the external cause: Secondary | ICD-10-CM | POA: Diagnosis not present

## 2015-10-26 DIAGNOSIS — G629 Polyneuropathy, unspecified: Secondary | ICD-10-CM | POA: Diagnosis not present

## 2015-10-26 DIAGNOSIS — L97822 Non-pressure chronic ulcer of other part of left lower leg with fat layer exposed: Secondary | ICD-10-CM | POA: Diagnosis not present

## 2015-10-26 LAB — CULTURE, BLOOD (ROUTINE X 2): Culture: NO GROWTH

## 2015-10-26 MED ORDER — TESTOSTERONE CYPIONATE 100 MG/ML IM SOLN: INTRAMUSCULAR | Status: DC

## 2015-10-26 NOTE — Telephone Encounter (Signed)
[facility] 

## 2015-10-28 ENCOUNTER — Other Ambulatory Visit: Payer: Self-pay

## 2015-10-28 DIAGNOSIS — Z85828 Personal history of other malignant neoplasm of skin: Secondary | ICD-10-CM | POA: Diagnosis not present

## 2015-10-28 DIAGNOSIS — L570 Actinic keratosis: Secondary | ICD-10-CM | POA: Diagnosis not present

## 2015-10-28 MED ORDER — TESTOSTERONE CYPIONATE 100 MG/ML IM SOLN
INTRAMUSCULAR | Status: DC
Start: 2015-10-28 — End: 2017-02-08

## 2015-10-28 NOTE — Telephone Encounter (Signed)
Faxed to [facility] Fax Number: [phone], Phone Number [phone]  

## 2015-10-29 DIAGNOSIS — H401113 Primary open-angle glaucoma, right eye, severe stage: Secondary | ICD-10-CM | POA: Diagnosis not present

## 2015-10-29 DIAGNOSIS — H04123 Dry eye syndrome of bilateral lacrimal glands: Secondary | ICD-10-CM | POA: Diagnosis not present

## 2015-10-29 DIAGNOSIS — H401123 Primary open-angle glaucoma, left eye, severe stage: Secondary | ICD-10-CM | POA: Diagnosis not present

## 2015-10-30 DIAGNOSIS — D510 Vitamin B12 deficiency anemia due to intrinsic factor deficiency: Secondary | ICD-10-CM | POA: Diagnosis not present

## 2015-10-30 DIAGNOSIS — E230 Hypopituitarism: Secondary | ICD-10-CM | POA: Diagnosis not present

## 2015-10-30 DIAGNOSIS — I10 Essential (primary) hypertension: Secondary | ICD-10-CM | POA: Diagnosis not present

## 2015-10-30 DIAGNOSIS — E559 Vitamin D deficiency, unspecified: Secondary | ICD-10-CM | POA: Diagnosis not present

## 2015-10-30 DIAGNOSIS — E7800 Pure hypercholesterolemia, unspecified: Secondary | ICD-10-CM | POA: Diagnosis not present

## 2015-11-02 ENCOUNTER — Encounter (HOSPITAL_BASED_OUTPATIENT_CLINIC_OR_DEPARTMENT_OTHER): Payer: PPO | Attending: Internal Medicine

## 2015-11-02 DIAGNOSIS — I87332 Chronic venous hypertension (idiopathic) with ulcer and inflammation of left lower extremity: Secondary | ICD-10-CM | POA: Diagnosis not present

## 2015-11-02 DIAGNOSIS — L97821 Non-pressure chronic ulcer of other part of left lower leg limited to breakdown of skin: Secondary | ICD-10-CM | POA: Diagnosis not present

## 2015-11-04 DIAGNOSIS — H401133 Primary open-angle glaucoma, bilateral, severe stage: Secondary | ICD-10-CM | POA: Diagnosis not present

## 2015-11-04 DIAGNOSIS — H04123 Dry eye syndrome of bilateral lacrimal glands: Secondary | ICD-10-CM | POA: Diagnosis not present

## 2015-11-04 DIAGNOSIS — H02109 Unspecified ectropion of unspecified eye, unspecified eyelid: Secondary | ICD-10-CM | POA: Diagnosis not present

## 2015-11-04 DIAGNOSIS — H16213 Exposure keratoconjunctivitis, bilateral: Secondary | ICD-10-CM | POA: Diagnosis not present

## 2015-11-04 DIAGNOSIS — H0289 Other specified disorders of eyelid: Secondary | ICD-10-CM | POA: Diagnosis not present

## 2015-11-05 DIAGNOSIS — E230 Hypopituitarism: Secondary | ICD-10-CM | POA: Diagnosis not present

## 2015-11-05 DIAGNOSIS — A499 Bacterial infection, unspecified: Secondary | ICD-10-CM | POA: Diagnosis not present

## 2015-11-09 DIAGNOSIS — I87332 Chronic venous hypertension (idiopathic) with ulcer and inflammation of left lower extremity: Secondary | ICD-10-CM | POA: Diagnosis not present

## 2015-11-09 DIAGNOSIS — L97221 Non-pressure chronic ulcer of left calf limited to breakdown of skin: Secondary | ICD-10-CM | POA: Diagnosis not present

## 2015-11-16 DIAGNOSIS — I87332 Chronic venous hypertension (idiopathic) with ulcer and inflammation of left lower extremity: Secondary | ICD-10-CM | POA: Diagnosis not present

## 2015-11-19 DIAGNOSIS — Z471 Aftercare following joint replacement surgery: Secondary | ICD-10-CM | POA: Diagnosis not present

## 2015-11-19 DIAGNOSIS — Z96651 Presence of right artificial knee joint: Secondary | ICD-10-CM | POA: Diagnosis not present

## 2015-11-24 DIAGNOSIS — L84 Corns and callosities: Secondary | ICD-10-CM | POA: Diagnosis not present

## 2015-11-24 DIAGNOSIS — S7011XD Contusion of right thigh, subsequent encounter: Secondary | ICD-10-CM | POA: Diagnosis not present

## 2015-11-24 DIAGNOSIS — B351 Tinea unguium: Secondary | ICD-10-CM | POA: Diagnosis not present

## 2015-11-24 DIAGNOSIS — I87332 Chronic venous hypertension (idiopathic) with ulcer and inflammation of left lower extremity: Secondary | ICD-10-CM | POA: Diagnosis not present

## 2015-11-24 DIAGNOSIS — M79672 Pain in left foot: Secondary | ICD-10-CM | POA: Diagnosis not present

## 2015-11-24 DIAGNOSIS — M79671 Pain in right foot: Secondary | ICD-10-CM | POA: Diagnosis not present

## 2015-11-24 DIAGNOSIS — L97221 Non-pressure chronic ulcer of left calf limited to breakdown of skin: Secondary | ICD-10-CM | POA: Diagnosis not present

## 2015-11-24 DIAGNOSIS — L89892 Pressure ulcer of other site, stage 2: Secondary | ICD-10-CM | POA: Diagnosis not present

## 2015-11-26 ENCOUNTER — Encounter (HOSPITAL_COMMUNITY): Payer: Self-pay | Admitting: Emergency Medicine

## 2015-11-26 ENCOUNTER — Emergency Department (HOSPITAL_COMMUNITY): Payer: PPO

## 2015-11-26 ENCOUNTER — Inpatient Hospital Stay (HOSPITAL_COMMUNITY)
Admission: EM | Admit: 2015-11-26 | Discharge: 2015-12-01 | DRG: 872 | Disposition: A | Discharge status: Skilled Nursing Facility | Payer: PPO | Attending: Internal Medicine | Admitting: Internal Medicine

## 2015-11-26 DIAGNOSIS — Z888 Allergy status to other drugs, medicaments and biological substances status: Secondary | ICD-10-CM | POA: Diagnosis not present

## 2015-11-26 DIAGNOSIS — Z9049 Acquired absence of other specified parts of digestive tract: Secondary | ICD-10-CM

## 2015-11-26 DIAGNOSIS — Z885 Allergy status to narcotic agent status: Secondary | ICD-10-CM

## 2015-11-26 DIAGNOSIS — I959 Hypotension, unspecified: Secondary | ICD-10-CM

## 2015-11-26 DIAGNOSIS — N183 Chronic kidney disease, stage 3 unspecified: Secondary | ICD-10-CM | POA: Diagnosis present

## 2015-11-26 DIAGNOSIS — A419 Sepsis, unspecified organism: Principal | ICD-10-CM | POA: Diagnosis present

## 2015-11-26 DIAGNOSIS — Z801 Family history of malignant neoplasm of trachea, bronchus and lung: Secondary | ICD-10-CM | POA: Diagnosis not present

## 2015-11-26 DIAGNOSIS — Z79899 Other long term (current) drug therapy: Secondary | ICD-10-CM

## 2015-11-26 DIAGNOSIS — J111 Influenza due to unidentified influenza virus with other respiratory manifestations: Secondary | ICD-10-CM | POA: Diagnosis present

## 2015-11-26 DIAGNOSIS — E559 Vitamin D deficiency, unspecified: Secondary | ICD-10-CM | POA: Diagnosis present

## 2015-11-26 DIAGNOSIS — N179 Acute kidney failure, unspecified: Secondary | ICD-10-CM | POA: Diagnosis not present

## 2015-11-26 DIAGNOSIS — Z7952 Long term (current) use of systemic steroids: Secondary | ICD-10-CM

## 2015-11-26 DIAGNOSIS — E538 Deficiency of other specified B group vitamins: Secondary | ICD-10-CM | POA: Diagnosis not present

## 2015-11-26 DIAGNOSIS — E230 Hypopituitarism: Secondary | ICD-10-CM | POA: Diagnosis present

## 2015-11-26 DIAGNOSIS — Z66 Do not resuscitate: Secondary | ICD-10-CM | POA: Diagnosis not present

## 2015-11-26 DIAGNOSIS — E039 Hypothyroidism, unspecified: Secondary | ICD-10-CM | POA: Diagnosis not present

## 2015-11-26 DIAGNOSIS — I129 Hypertensive chronic kidney disease with stage 1 through stage 4 chronic kidney disease, or unspecified chronic kidney disease: Secondary | ICD-10-CM | POA: Diagnosis not present

## 2015-11-26 DIAGNOSIS — N400 Enlarged prostate without lower urinary tract symptoms: Secondary | ICD-10-CM | POA: Diagnosis present

## 2015-11-26 DIAGNOSIS — Z87442 Personal history of urinary calculi: Secondary | ICD-10-CM

## 2015-11-26 DIAGNOSIS — E236 Other disorders of pituitary gland: Secondary | ICD-10-CM | POA: Diagnosis not present

## 2015-11-26 DIAGNOSIS — R110 Nausea: Secondary | ICD-10-CM

## 2015-11-26 DIAGNOSIS — H409 Unspecified glaucoma: Secondary | ICD-10-CM | POA: Diagnosis present

## 2015-11-26 DIAGNOSIS — J101 Influenza due to other identified influenza virus with other respiratory manifestations: Secondary | ICD-10-CM | POA: Diagnosis present

## 2015-11-26 DIAGNOSIS — Z85828 Personal history of other malignant neoplasm of skin: Secondary | ICD-10-CM

## 2015-11-26 DIAGNOSIS — J40 Bronchitis, not specified as acute or chronic: Secondary | ICD-10-CM | POA: Diagnosis present

## 2015-11-26 DIAGNOSIS — Z96611 Presence of right artificial shoulder joint: Secondary | ICD-10-CM | POA: Diagnosis present

## 2015-11-26 DIAGNOSIS — E785 Hyperlipidemia, unspecified: Secondary | ICD-10-CM | POA: Diagnosis present

## 2015-11-26 DIAGNOSIS — Z905 Acquired absence of kidney: Secondary | ICD-10-CM | POA: Diagnosis not present

## 2015-11-26 DIAGNOSIS — Z86010 Personal history of colonic polyps: Secondary | ICD-10-CM

## 2015-11-26 DIAGNOSIS — R509 Fever, unspecified: Secondary | ICD-10-CM | POA: Diagnosis not present

## 2015-11-26 DIAGNOSIS — E893 Postprocedural hypopituitarism: Secondary | ICD-10-CM | POA: Diagnosis present

## 2015-11-26 DIAGNOSIS — Z85528 Personal history of other malignant neoplasm of kidney: Secondary | ICD-10-CM | POA: Diagnosis not present

## 2015-11-26 NOTE — ED Notes (Signed)
Bed: [facility] Expected date:  Expected time:  Means of arrival:  Comments: EMS Fever

## 2015-11-26 NOTE — ED Notes (Signed)
Per EMS, pt from independent living facility has fever of 102.9. Pt fell today- no injuries or LOC reported. Hx of cellulitis recently on 10/21/15.

## 2015-11-27 DIAGNOSIS — J40 Bronchitis, not specified as acute or chronic: Secondary | ICD-10-CM | POA: Diagnosis not present

## 2015-11-27 DIAGNOSIS — E236 Other disorders of pituitary gland: Secondary | ICD-10-CM | POA: Diagnosis not present

## 2015-11-27 DIAGNOSIS — Z7952 Long term (current) use of systemic steroids: Secondary | ICD-10-CM | POA: Diagnosis not present

## 2015-11-27 DIAGNOSIS — E230 Hypopituitarism: Secondary | ICD-10-CM | POA: Diagnosis not present

## 2015-11-27 DIAGNOSIS — I129 Hypertensive chronic kidney disease with stage 1 through stage 4 chronic kidney disease, or unspecified chronic kidney disease: Secondary | ICD-10-CM | POA: Diagnosis not present

## 2015-11-27 DIAGNOSIS — N400 Enlarged prostate without lower urinary tract symptoms: Secondary | ICD-10-CM | POA: Diagnosis not present

## 2015-11-27 DIAGNOSIS — N179 Acute kidney failure, unspecified: Secondary | ICD-10-CM | POA: Diagnosis present

## 2015-11-27 DIAGNOSIS — Z96611 Presence of right artificial shoulder joint: Secondary | ICD-10-CM | POA: Diagnosis not present

## 2015-11-27 DIAGNOSIS — Z905 Acquired absence of kidney: Secondary | ICD-10-CM | POA: Diagnosis not present

## 2015-11-27 DIAGNOSIS — N183 Chronic kidney disease, stage 3 unspecified: Secondary | ICD-10-CM | POA: Diagnosis present

## 2015-11-27 DIAGNOSIS — I959 Hypotension, unspecified: Secondary | ICD-10-CM | POA: Diagnosis present

## 2015-11-27 DIAGNOSIS — E785 Hyperlipidemia, unspecified: Secondary | ICD-10-CM | POA: Diagnosis not present

## 2015-11-27 DIAGNOSIS — R509 Fever, unspecified: Secondary | ICD-10-CM | POA: Diagnosis present

## 2015-11-27 DIAGNOSIS — Z888 Allergy status to other drugs, medicaments and biological substances status: Secondary | ICD-10-CM | POA: Diagnosis not present

## 2015-11-27 DIAGNOSIS — H409 Unspecified glaucoma: Secondary | ICD-10-CM | POA: Diagnosis not present

## 2015-11-27 DIAGNOSIS — Z66 Do not resuscitate: Secondary | ICD-10-CM | POA: Diagnosis not present

## 2015-11-27 DIAGNOSIS — J111 Influenza due to unidentified influenza virus with other respiratory manifestations: Secondary | ICD-10-CM | POA: Diagnosis not present

## 2015-11-27 DIAGNOSIS — Z9049 Acquired absence of other specified parts of digestive tract: Secondary | ICD-10-CM | POA: Diagnosis not present

## 2015-11-27 DIAGNOSIS — E039 Hypothyroidism, unspecified: Secondary | ICD-10-CM | POA: Diagnosis not present

## 2015-11-27 DIAGNOSIS — R110 Nausea: Secondary | ICD-10-CM | POA: Diagnosis not present

## 2015-11-27 DIAGNOSIS — Z85828 Personal history of other malignant neoplasm of skin: Secondary | ICD-10-CM | POA: Diagnosis not present

## 2015-11-27 DIAGNOSIS — E538 Deficiency of other specified B group vitamins: Secondary | ICD-10-CM | POA: Diagnosis not present

## 2015-11-27 DIAGNOSIS — Z85528 Personal history of other malignant neoplasm of kidney: Secondary | ICD-10-CM | POA: Diagnosis not present

## 2015-11-27 DIAGNOSIS — J101 Influenza due to other identified influenza virus with other respiratory manifestations: Secondary | ICD-10-CM | POA: Diagnosis not present

## 2015-11-27 DIAGNOSIS — A419 Sepsis, unspecified organism: Secondary | ICD-10-CM | POA: Diagnosis not present

## 2015-11-27 DIAGNOSIS — Z79899 Other long term (current) drug therapy: Secondary | ICD-10-CM | POA: Diagnosis not present

## 2015-11-27 DIAGNOSIS — Z87442 Personal history of urinary calculi: Secondary | ICD-10-CM | POA: Diagnosis not present

## 2015-11-27 DIAGNOSIS — Z86010 Personal history of colonic polyps: Secondary | ICD-10-CM | POA: Diagnosis not present

## 2015-11-27 DIAGNOSIS — E559 Vitamin D deficiency, unspecified: Secondary | ICD-10-CM | POA: Diagnosis not present

## 2015-11-27 DIAGNOSIS — Z885 Allergy status to narcotic agent status: Secondary | ICD-10-CM | POA: Diagnosis not present

## 2015-11-27 DIAGNOSIS — Z801 Family history of malignant neoplasm of trachea, bronchus and lung: Secondary | ICD-10-CM | POA: Diagnosis not present

## 2015-11-27 LAB — URINALYSIS, ROUTINE W REFLEX MICROSCOPIC
Bilirubin Urine: NEGATIVE
Glucose, UA: NEGATIVE mg/dL
Ketones, ur: NEGATIVE mg/dL
LEUKOCYTES UA: NEGATIVE
NITRITE: NEGATIVE
Protein, ur: NEGATIVE mg/dL
SPECIFIC GRAVITY, URINE: 1.015 (ref 1.005–1.030)
pH: 5 (ref 5.0–8.0)

## 2015-11-27 LAB — URINE MICROSCOPIC-ADD ON

## 2015-11-27 LAB — COMPREHENSIVE METABOLIC PANEL
ALT: 22 U/L (ref 17–63)
AST: 27 U/L (ref 15–41)
Albumin: 3.7 g/dL (ref 3.5–5.0)
Alkaline Phosphatase: 49 U/L (ref 38–126)
Anion gap: 9 (ref 5–15)
BUN: 21 mg/dL — AB (ref 6–20)
CHLORIDE: 98 mmol/L — AB (ref 101–111)
CO2: 28 mmol/L (ref 22–32)
Calcium: 9 mg/dL (ref 8.9–10.3)
Creatinine, Ser: 1.52 mg/dL — ABNORMAL HIGH (ref 0.61–1.24)
GFR calc Af Amer: 45 mL/min — ABNORMAL LOW (ref >60)
GFR, EST NON AFRICAN AMERICAN: 39 mL/min — AB (ref >60)
Glucose, Bld: 94 mg/dL (ref 65–99)
POTASSIUM: 4.2 mmol/L (ref 3.5–5.1)
Sodium: 135 mmol/L (ref 135–145)
Total Bilirubin: 0.9 mg/dL (ref 0.3–1.2)
Total Protein: 6.4 g/dL — ABNORMAL LOW (ref 6.5–8.1)

## 2015-11-27 LAB — I-STAT CG4 LACTIC ACID, ED
Lactic Acid, Venous: 1.03 mmol/L (ref 0.5–2.0)
Lactic Acid, Venous: 1.39 mmol/L (ref 0.5–2.0)

## 2015-11-27 LAB — CBC
HEMATOCRIT: 32.2 % — AB (ref 39.0–52.0)
HEMOGLOBIN: 10.4 g/dL — AB (ref 13.0–17.0)
MCH: 30.2 pg (ref 26.0–34.0)
MCHC: 32.3 g/dL (ref 30.0–36.0)
MCV: 93.6 fL (ref 78.0–100.0)
Platelets: 195 10*3/uL (ref 150–400)
RBC: 3.44 MIL/uL — ABNORMAL LOW (ref 4.22–5.81)
RDW: 16.6 % — ABNORMAL HIGH (ref 11.5–15.5)
WBC: 11.6 10*3/uL — ABNORMAL HIGH (ref 4.0–10.5)

## 2015-11-27 LAB — LACTIC ACID, PLASMA: Lactic Acid, Venous: 1 mmol/L (ref 0.5–2.0)

## 2015-11-27 LAB — INFLUENZA PANEL BY PCR (TYPE A & B)
H1N1 flu by pcr: NOT DETECTED
INFLAPCR: NEGATIVE
Influenza B By PCR: POSITIVE — AB

## 2015-11-27 MED ORDER — OSELTAMIVIR PHOSPHATE 75 MG PO CAPS
75.0000 mg | ORAL_CAPSULE | ORAL | Status: DC
  Filled 2015-11-27: qty 1

## 2015-11-27 MED ORDER — ACETAMINOPHEN 500 MG PO TABS
1000.0000 mg | ORAL_TABLET | Freq: Once | ORAL | Status: AC
Start: 2015-11-27 — End: 2015-11-27
  Administered 2015-11-27: 1000 mg via ORAL
  Filled 2015-11-27: qty 2

## 2015-11-27 MED ORDER — ACETAMINOPHEN 650 MG RE SUPP: 650.0000 mg | Freq: Four times a day (QID) | RECTAL | Status: DC | PRN

## 2015-11-27 MED ORDER — PRAVASTATIN SODIUM 80 MG PO TABS
80.0000 mg | ORAL_TABLET | Freq: Every evening | ORAL | Status: DC
  Administered 2015-11-27 – 2015-11-30 (×4): 80 mg via ORAL
  Filled 2015-11-27 (×5): qty 1

## 2015-11-27 MED ORDER — SODIUM CHLORIDE 0.9 % IV SOLN
INTRAVENOUS | Status: DC
  Administered 2015-11-27 (×2): via INTRAVENOUS

## 2015-11-27 MED ORDER — HEPARIN SODIUM (PORCINE) 5000 UNIT/ML IJ SOLN
5000.0000 [IU] | Freq: Three times a day (TID) | INTRAMUSCULAR | Status: DC
  Administered 2015-11-27 – 2015-12-01 (×13): 5000 [IU] via SUBCUTANEOUS
  Filled 2015-11-27 (×13): qty 1

## 2015-11-27 MED ORDER — OSELTAMIVIR PHOSPHATE 30 MG PO CAPS
30.0000 mg | ORAL_CAPSULE | Freq: Two times a day (BID) | ORAL | Status: DC
  Administered 2015-11-27 – 2015-12-01 (×9): 30 mg via ORAL
  Filled 2015-11-27 (×11): qty 1

## 2015-11-27 MED ORDER — PIPERACILLIN-TAZOBACTAM 3.375 G IVPB 30 MIN
3.3750 g | Freq: Once | INTRAVENOUS | Status: AC
  Administered 2015-11-27: 3.375 g via INTRAVENOUS
  Filled 2015-11-27: qty 50

## 2015-11-27 MED ORDER — ONDANSETRON HCL 4 MG/2ML IJ SOLN
4.0000 mg | Freq: Four times a day (QID) | INTRAMUSCULAR | Status: DC | PRN
  Administered 2015-11-27 – 2015-11-29 (×5): 4 mg via INTRAVENOUS
  Filled 2015-11-27 (×6): qty 2

## 2015-11-27 MED ORDER — FINASTERIDE 5 MG PO TABS
5.0000 mg | ORAL_TABLET | Freq: Every morning | ORAL | Status: DC
  Administered 2015-11-27 – 2015-12-01 (×5): 5 mg via ORAL
  Filled 2015-11-27 (×5): qty 1

## 2015-11-27 MED ORDER — ACETAMINOPHEN 325 MG PO TABS
650.0000 mg | ORAL_TABLET | Freq: Four times a day (QID) | ORAL | Status: DC | PRN
  Administered 2015-11-27: 650 mg via ORAL

## 2015-11-27 MED ORDER — PREDNISONE 5 MG PO TABS
2.5000 mg | ORAL_TABLET | Freq: Every day | ORAL | Status: DC
  Administered 2015-11-27 – 2015-11-30 (×3): 2.5 mg via ORAL
  Filled 2015-11-27 (×3): qty 1

## 2015-11-27 MED ORDER — ONDANSETRON HCL 4 MG PO TABS: 4.0000 mg | ORAL_TABLET | Freq: Four times a day (QID) | ORAL | Status: DC | PRN

## 2015-11-27 MED ORDER — SODIUM CHLORIDE 0.9 % IV BOLUS (SEPSIS)
3000.0000 mL | Freq: Once | INTRAVENOUS | Status: AC
  Administered 2015-11-27: 3000 mL via INTRAVENOUS

## 2015-11-27 MED ORDER — PREDNISONE 2.5 MG PO TABS
5.0000 mg | ORAL_TABLET | Freq: Every day | ORAL | Status: DC
  Administered 2015-11-27: 5 mg via ORAL
  Filled 2015-11-27 (×2): qty 2

## 2015-11-27 MED ORDER — LEVOTHYROXINE SODIUM 88 MCG PO TABS
88.0000 ug | ORAL_TABLET | Freq: Every day | ORAL | Status: DC
  Administered 2015-11-27 – 2015-12-01 (×5): 88 ug via ORAL
  Filled 2015-11-27 (×5): qty 1

## 2015-11-27 MED ORDER — ACETAMINOPHEN 325 MG PO TABS
650.0000 mg | ORAL_TABLET | Freq: Four times a day (QID) | ORAL | Status: DC | PRN
  Filled 2015-11-27: qty 2

## 2015-11-27 MED ORDER — VANCOMYCIN HCL IN DEXTROSE 1-5 GM/200ML-% IV SOLN
1000.0000 mg | Freq: Once | INTRAVENOUS | Status: AC
  Administered 2015-11-27: 1000 mg via INTRAVENOUS
  Filled 2015-11-27: qty 200

## 2015-11-27 NOTE — Progress Notes (Signed)
PT Cancellation Note  Patient Details Name: [name] [name]. MRN: [id number] DOB: [birth year]   Cancelled Treatment:    Reason Eval/Treat Not Completed: Patient not medically ready per RN. Will check back another day. Thanks.    Weston Anna, MPT Pager: [phone]

## 2015-11-27 NOTE — ED Provider Notes (Signed)
CSN: [id number]     Arrival date & time 11/26/15  2314 History   First MD Initiated Contact with Patient 11/26/15 2343     Chief Complaint  Patient presents with  . Fever     (Consider location/radiation/quality/duration/timing/severity/associated sxs/prior Treatment) HPI Patient presents to the emergency department with fever, nausea that started yesterday.  The patient is from a nursing facility, which currently has an outbreak of influenza.  The patient does not give me any history.  The son is providing a full history to the best of his ability.  Son states that the patient has had no other complaints other than nausea and fever.  Patient states that he has not had any cough.  Son states that patient has not taken any medications prior to arrival for the symptoms Past Medical History  Diagnosis Date  . Hypertension   . Arthritis     Status post left total replacement 8 2011  . Pituitary mass ([place]) 2000    S/p transphenoidal excision 05/1999 ([facility])  . 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 Hypothyroidism (acquired) 2000  . Anemia, B12 deficiency 2000  . Hyperlipidemia 2003  . Vitamin D deficiency 2009  . History of renal cell carcinoma 1994    Status post right nephrectomy  . BPH (benign prostatic hyperplasia) 2013  . Glaucoma 2007    Status post left trabeculectomy 2007  . History of colon polyps     Colonoscopy 2001  . Allergic rhinitis     Prior allergy shots 20 years  . Colitis 2014  . Anemia   . Cancer ([place])     renal cell ca and skin cancer   . Nocturia   . History of kidney stones   . Difficult intubation 1994    surgery had to be  stopped due to injury to "throat"  . Difficult intubation 1994    no trouble since.  Required nasotracheal intubation  '02 [facility] / ANESTHESIA RECORD FROM 2013 AND 2016 IN EPIC   Past Surgical History  Procedure Laterality Date  . Tonsillectomy    . Eye surgery  over last 6 yrs.       trabeculectomy...   . Eye surgery   cat ext ou  . Brain surgery  2000    pituatary gland removed .  Marland Kitchen Joint replacement  2011    knee left  . Cholecystectomy  1984  . Reverse shoulder arthroplasty  12/15/2011    Procedure: REVERSE SHOULDER ARTHROPLASTY;  Surgeon: Marin Shutter, MD;  Location: [facility];  Service: Orthopedics;  Laterality: Right;  right total reverse shoulder  . Nephrectomy Right 1994    Renal cell  . Transsphenoidal excision pituitary tumor  05/1999    A.Tommi Rumps, M.D.([facility])  . Ectropion surgery Bilateral 2006  . Mohs procedure       for skin cancer on nose   . Knee arthroscopy Right 02/06/2015    Procedure: RIGHT ARTHROSCOPY KNEE WITH LATERAL MENSICAL  DEBRIDEMENT;  Surgeon: Gaynelle Arabian, MD;  Location: [facility];  Service: Orthopedics;  Laterality: Right;  . Total knee arthroplasty Right 06/29/2015    Procedure: TOTAL KNEE ARTHROPLASTY;  Surgeon: Gaynelle Arabian, MD;  Location: [facility];  Service: Orthopedics;  Laterality: Right;   Family History  Problem Relation Age of Onset  . Anesthesia problems Neg Hx   . Hypotension Neg Hx   . Malignant hyperthermia Neg Hx   . Pseudochol deficiency Neg Hx   .  Lung cancer Father    Social History  Substance Use Topics  . Smoking status: Former Smoker    Types: Cigarettes, Pipe    Quit date: 09/26/1988  . Smokeless tobacco: Never Used  . Alcohol Use: Yes     Comment: 1.5-2 ounces of vodka nightly     Review of Systems  Level V caveat applies due to uncooperativeness Allergies  Percocet and Diovan  Home Medications   Prior to Admission medications   Medication Sig Start Date End Date Taking? Authorizing Provider  acetaminophen (TYLENOL) 325 MG tablet Take 2 tablets (650 mg total) by mouth every 6 (six) hours as needed for mild pain or fever. 10/24/15   Bonnielee Haff, MD  co-enzyme Q-10 30 MG capsule Take 30 mg by mouth daily.    Historical Provider, MD  doxycycline (VIBRA-TABS) 100 MG tablet Take 1 tablet (100 mg  total) by mouth 2 (two) times daily. For 12 more days 10/24/15   Bonnielee Haff, MD  finasteride (PROSCAR) 5 MG tablet Take 5 mg by mouth every morning.     Historical Provider, MD  levofloxacin (LEVAQUIN) 750 MG tablet Take 1 tablet (750 mg total) by mouth every other day. For 12 more days (6 more doses) 10/24/15   Bonnielee Haff, MD  levothyroxine (SYNTHROID, LEVOTHROID) 88 MCG tablet Take 88 mcg by mouth daily.    Historical Provider, MD  Multiple Vitamins-Minerals (MULTIVITAMIN & MINERAL PO) Take 1 tablet by mouth daily.    Historical Provider, MD  pravastatin (PRAVACHOL) 80 MG tablet Take 80 mg by mouth every evening.     Historical Provider, MD  predniSONE (DELTASONE) 2.5 MG tablet Take 2.5-5 mg by mouth 2 (two) times daily with a meal. Take 2 tablets (5 mg) in the am and Take 1 tablet (2.5 mg) in the evening.    Historical Provider, MD  saccharomyces boulardii (FLORASTOR) 250 MG capsule Take 1 capsule (250 mg total) by mouth 2 (two) times daily. For 2 weeks 10/24/15   Bonnielee Haff, MD  testosterone cypionate (DEPO-TESTOSTERONE) 100 MG/ML injection Inject 0.22ml intramuscularly once weekly 10/28/15   Gildardo Cranker, DO  vitamin B-12 (CYANOCOBALAMIN) 1000 MCG tablet Take 1,000 mcg by mouth daily.    Historical Provider, MD  VITAMIN D, ERGOCALCIFEROL, PO Take 2,000 Units by mouth daily.     Historical Provider, MD   BP 160/99 mmHg  Pulse 104  Temp(Src) 103.5 F (39.7 C) (Rectal)  Resp 16  SpO2 90% Physical Exam  Constitutional: He is oriented to person, place, and time. He appears well-developed and well-nourished. No distress.  HENT:  Head: Normocephalic and atraumatic.  Mouth/Throat: Oropharynx is clear and moist.  Eyes: Pupils are equal, round, and reactive to light.  Neck: Normal range of motion. Neck supple.  Cardiovascular: Normal rate, regular rhythm and normal heart sounds.  Exam reveals no gallop and no friction rub.   No murmur heard. Pulmonary/Chest: Effort normal and breath  sounds normal. No respiratory distress. He has no wheezes.  Abdominal: Soft. Bowel sounds are normal. He exhibits no distension. There is no tenderness.  Neurological: He is alert and oriented to person, place, and time. He exhibits normal muscle tone. Coordination normal.  Skin: Skin is warm and dry. No rash noted. No erythema.  Psychiatric: He has a normal mood and affect. His behavior is normal.  Nursing note and vitals reviewed.   ED Course  Procedures (including critical care time) Labs Review Labs Reviewed  URINALYSIS, ROUTINE W REFLEX MICROSCOPIC (NOT AT [facility]) -  Abnormal; Notable for the following:    Hgb urine dipstick SMALL (*)    All other components within normal limits  URINE MICROSCOPIC-ADD ON - Abnormal; Notable for the following:    Squamous Epithelial / LPF 0-5 (*)    Bacteria, UA RARE (*)    All other components within normal limits  CULTURE, BLOOD (ROUTINE X 2)  CULTURE, BLOOD (ROUTINE X 2)  URINE CULTURE  COMPREHENSIVE METABOLIC PANEL  I-STAT CG4 LACTIC ACID, ED    Imaging Review Dg Chest 2 View  11/27/2015  CLINICAL DATA:  82 year old male with history of renal cell carcinoma presenting with fever EXAM: CHEST  2 VIEW COMPARISON:  Chest radiograph dated 10/20/2015 FINDINGS: Two views of chest demonstrate emphysematous changes of the lungs with bibasilar atelectatic changes. There is no focal consolidation, pleural effusion, or pneumothorax. There is stable mild cardiomegaly. There is osteopenia with degenerative changes of the spine. Right shoulder arthroplasty. No acute fracture IMPRESSION: No active cardiopulmonary disease. Electronically Signed   By: Anner Crete M.D.   On: 11/27/2015 00:00   I have personally reviewed and evaluated these images and lab results as part of my medical decision-making.   EKG Interpretation None      Patient is being evaluated for elevated temperature and possible influenza-like illness.  I did discuss with the son about  possible admission versus returning to the facility.  The son states that the facility currently does have a quarantined area for patients that had flulike illnesses    Dalia Heading, PA-C 11/27/15 0113  Jola Schmidt, MD 11/27/15 [phone]

## 2015-11-27 NOTE — Progress Notes (Signed)
Received handout regarding [name] [name] from Dr. Venora Maples  Patient is a 82 year old male with past medical history significant for HTN, HLD, BPH, anemia, hypothyroidism; who presented from nursing facility with fever of 103.38F. There was note of a recent outbreak of influenza. Influenza currently pending. Given 1 dose of Tamiflu and started empirically on antibiotics of vancomycin and Zosyn for fever of unknown origin. Patient suffered an episode of hypotension with blood pressures as low as 70/40, but responsive to IV fluids. Vital signs noted to be stable. Admit to MedSurg bed.

## 2015-11-27 NOTE — ED Notes (Signed)
Pt [name] [name], [name] [name], [phone] (cell)

## 2015-11-27 NOTE — H&P (Addendum)
[facility] History and Physical  [employer]. [id number] DOB: 1923/06/30 DOA: 11/26/2015   PCP: [name], MD    Chief Complaint: states he was told by his PCP to come to the hospital  HPI: [name] [name]. is a 82 y.o. male with h/o pituitary mass s/p resection, renal cell cancer s/p nephrectomy presenting from independent living with a fever of 102.9 degrees and a fall. He had nausea a couple of days ago. Cannot tell me if he has had decreased PO intake. Apparently there has been a flu outbreak at his facility. No cough, runny nose, sore throat. No diarrhea. I have opened the dressing on his left leg- no cellulitis. He states he goes to the wound care center to have it managed by Dr Dellia Nims. Was recently treated for a cellulitis it appears from med/rec with Levaquin and Doxycycline.     General: The patient denies anorexia, fever, weight loss Cardiac: Denies chest pain, syncope, palpitations, pedal edema  Respiratory: Denies cough, shortness of breath, wheezing GI: Denies severe indigestion/heartburn, abdominal pain, vomiting, diarrhea and constipation GU: Denies hematuria, incontinence, dysuria  Musculoskeletal: Denies arthritis  Skin: has had grafts on his left leg that the wound care center is managing.  Neurologic: Denies focal weakness or numbness, change in vision Psychiatry: Denies depression or anxiety. Hematologic: no easy bruising or bleeding  All other systems reviewed and found to be negative.  Past Medical History  Diagnosis Date  . Hypertension   . Arthritis     Status post left total replacement 8 2011  . Pituitary mass ([place]) 2000    S/p transphenoidal excision 05/1999 ([facility])  . 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 Hypothyroidism (acquired) 2000  . Anemia, B12 deficiency 2000  . Hyperlipidemia 2003  . Vitamin D deficiency 2009  . History of renal cell carcinoma 1994    Status  post right nephrectomy  . BPH (benign prostatic hyperplasia) 2013  . Glaucoma 2007    Status post left trabeculectomy 2007  . History of colon polyps     Colonoscopy 2001  . Allergic rhinitis     Prior allergy shots 20 years  . Colitis 2014  . Anemia   . Cancer ([place])     renal cell ca and skin cancer   . Nocturia   . History of kidney stones   . Difficult intubation 1994    surgery had to be  stopped due to injury to "throat"  . Difficult intubation 1994    no trouble since.  Required nasotracheal intubation  '02 [facility] / ANESTHESIA RECORD FROM 2013 AND 2016 IN EPIC    Past Surgical History  Procedure Laterality Date  . Tonsillectomy    . Eye surgery  over last 6 yrs.      trabeculectomy...   . Eye surgery   cat ext ou  . Brain surgery  2000    pituatary gland removed .  Marland Kitchen Joint replacement  2011    knee left  . Cholecystectomy  1984  . Reverse shoulder arthroplasty  12/15/2011    Procedure: REVERSE SHOULDER ARTHROPLASTY;  Surgeon: Marin Shutter, MD;  Location: [place];  Service: Orthopedics;  Laterality: Right;  right total reverse shoulder  . Nephrectomy Right 1994    Renal cell  . Transsphenoidal excision pituitary tumor  05/1999    A.Tommi Rumps, M.D.([facility])  . Ectropion surgery Bilateral 2006  . Mohs procedure  for skin cancer on nose   . Knee arthroscopy Right 02/06/2015    Procedure: RIGHT ARTHROSCOPY KNEE WITH LATERAL MENSICAL  DEBRIDEMENT;  Surgeon: Gaynelle Arabian, MD;  Location: [facility];  Service: Orthopedics;  Laterality: Right;  . Total knee arthroplasty Right 06/29/2015    Procedure: TOTAL KNEE ARTHROPLASTY;  Surgeon: Gaynelle Arabian, MD;  Location: [facility];  Service: Orthopedics;  Laterality: Right;    Social History: does not smoke or drink Lives at independent living     Allergies  Allergen Reactions  . Percocet [Oxycodone-Acetaminophen] Other (See Comments)    Just doesn't like it  . Diovan [Valsartan] Rash    Family history:   Family History   Problem Relation Age of Onset  . Anesthesia problems Neg Hx   . Hypotension Neg Hx   . Malignant hyperthermia Neg Hx   . Pseudochol deficiency Neg Hx   . Lung cancer Father       Prior to Admission medications   Medication Sig Start Date End Date Taking? Authorizing Provider  acetaminophen (TYLENOL) 325 MG tablet Take 2 tablets (650 mg total) by mouth every 6 (six) hours as needed for mild pain or fever. 10/24/15  Yes Bonnielee Haff, MD  co-enzyme Q-10 30 MG capsule Take 30 mg by mouth daily.   Yes Historical Provider, MD  finasteride (PROSCAR) 5 MG tablet Take 5 mg by mouth every morning.    Yes Historical Provider, MD  levothyroxine (SYNTHROID, LEVOTHROID) 88 MCG tablet Take 88 mcg by mouth daily.   Yes Historical Provider, MD  Multiple Vitamins-Minerals (MULTIVITAMIN & MINERAL PO) Take 1 tablet by mouth daily.   Yes Historical Provider, MD  pravastatin (PRAVACHOL) 80 MG tablet Take 80 mg by mouth every evening.    Yes Historical Provider, MD  predniSONE (DELTASONE) 2.5 MG tablet Take 2.5-5 mg by mouth 2 (two) times daily with a meal. Take 2 tablets (5 mg) in the am and Take 1 tablet (2.5 mg) in the evening.   Yes Historical Provider, MD  testosterone cypionate (DEPO-TESTOSTERONE) 100 MG/ML injection Inject 0.86ml intramuscularly once weekly 10/28/15  Yes Gildardo Cranker, DO  vitamin B-12 (CYANOCOBALAMIN) 1000 MCG tablet Take 1,000 mcg by mouth daily.   Yes Historical Provider, MD  VITAMIN D, ERGOCALCIFEROL, PO Take 2,000 Units by mouth daily.    Yes Historical Provider, MD  doxycycline (VIBRA-TABS) 100 MG tablet Take 1 tablet (100 mg total) by mouth 2 (two) times daily. For 12 more days Patient not taking: Reported on 11/27/2015 10/24/15   Bonnielee Haff, MD  levofloxacin (LEVAQUIN) 750 MG tablet Take 1 tablet (750 mg total) by mouth every other day. For 12 more days (6 more doses) Patient not taking: Reported on 11/27/2015 10/24/15   Bonnielee Haff, MD  saccharomyces boulardii (FLORASTOR) 250  MG capsule Take 1 capsule (250 mg total) by mouth 2 (two) times daily. For 2 weeks Patient not taking: Reported on 11/27/2015 10/24/15   Bonnielee Haff, MD     Physical Exam: Filed Vitals:   11/27/15 0600 11/27/15 0630 11/27/15 0700 11/27/15 0817  BP: 121/45 123/46 104/50 122/48  Pulse: 85 76 81 78  Temp:    98.5 F (36.9 C)  TempSrc:    Oral  Resp: 16 19 19 16   SpO2: 92%  95% 95%     General: AAO x3 no acute distress HEENT: Normocephalic and Atraumatic, Mucous membranes pink                PERRLA; EOM intact; No scleral  icterus,                 Nares: Patent, Oropharynx: Clear, poor Dentition                 Neck: FROM, no cervical lymphadenopathy, thyromegaly, carotid bruit or JVD;  Breasts: deferred CHEST WALL: No tenderness  CHEST: Normal respiration, clear to auscultation bilaterally  HEART: Regular rate and rhythm; no murmurs rubs or gallops  BACK: No kyphosis or scoliosis; no CVA tenderness  GI: Positive Bowel Sounds, soft, non-tender; no masses, no organomegaly Rectal Exam: deferred MSK: No cyanosis, clubbing, or edema Genitalia: not examined  SKIN:  no rash or ulceration  CNS: Alert and Oriented x 4, Nonfocal exam, CN 2-12 intact  Labs on Admission:  Basic Metabolic Panel:  Recent Labs Lab 11/27/15 0027  NA 135  K 4.2  CL 98*  CO2 28  GLUCOSE 94  BUN 21*  CREATININE 1.52*  CALCIUM 9.0   Liver Function Tests:  Recent Labs Lab 11/27/15 0027  AST 27  ALT 22  ALKPHOS 49  BILITOT 0.9  PROT 6.4*  ALBUMIN 3.7   No results for input(s): LIPASE, AMYLASE in the last 168 hours. No results for input(s): AMMONIA in the last 168 hours. CBC:  Recent Labs Lab 11/27/15 0355  WBC 11.6*  HGB 10.4*  HCT 32.2*  MCV 93.6  PLT 195   Cardiac Enzymes: No results for input(s): CKTOTAL, CKMB, CKMBINDEX, TROPONINI in the last 168 hours.  BNP (last 3 results) No results for input(s): BNP in the last 8760 hours.  ProBNP (last 3 results) No results for  input(s): PROBNP in the last 8760 hours.  CBG: No results for input(s): GLUCAP in the last 168 hours.  Radiological Exams on Admission: Dg Chest 2 View  11/27/2015  CLINICAL DATA:  82 year old male with history of renal cell carcinoma presenting with fever EXAM: CHEST  2 VIEW COMPARISON:  Chest radiograph dated 10/20/2015 FINDINGS: Two views of chest demonstrate emphysematous changes of the lungs with bibasilar atelectatic changes. There is no focal consolidation, pleural effusion, or pneumothorax. There is stable mild cardiomegaly. There is osteopenia with degenerative changes of the spine. Right shoulder arthroplasty. No acute fracture IMPRESSION: No active cardiopulmonary disease. Electronically Signed   By: Anner Crete M.D.   On: 11/27/2015 00:00      Assessment/Plan Principal Problem:   Fever/  Hypotension/ mild leukocytosis - SBP dropped to 70s - CXR, UA negative, no ulcers/ cellulitis noted - Influenza swab sent- Tamiflu started - given Vanc and Zosyn in ER- will not give another dose at this time- follow clinically - hypotension improved with IVF- on 3rd bag- follow I and O- as he had had a pituitary resection, will place on stress dose steroids Addendum- influenza B +   Active Problems: AKI on CKD 3 - baseline Cr about 1.1-1.2 - prerenal?  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

## 2015-11-28 DIAGNOSIS — N179 Acute kidney failure, unspecified: Secondary | ICD-10-CM

## 2015-11-28 DIAGNOSIS — R509 Fever, unspecified: Secondary | ICD-10-CM

## 2015-11-28 DIAGNOSIS — N183 Chronic kidney disease, stage 3 (moderate): Secondary | ICD-10-CM

## 2015-11-28 DIAGNOSIS — E236 Other disorders of pituitary gland: Secondary | ICD-10-CM

## 2015-11-28 DIAGNOSIS — I9589 Other hypotension: Secondary | ICD-10-CM

## 2015-11-28 LAB — BASIC METABOLIC PANEL
ANION GAP: 9 (ref 5–15)
BUN: 15 mg/dL (ref 6–20)
CHLORIDE: 104 mmol/L (ref 101–111)
CO2: 22 mmol/L (ref 22–32)
Calcium: 7.9 mg/dL — ABNORMAL LOW (ref 8.9–10.3)
Creatinine, Ser: 1.25 mg/dL — ABNORMAL HIGH (ref 0.61–1.24)
GFR calc Af Amer: 57 mL/min — ABNORMAL LOW (ref >60)
GFR, EST NON AFRICAN AMERICAN: 49 mL/min — AB (ref >60)
GLUCOSE: 76 mg/dL (ref 65–99)
POTASSIUM: 4.4 mmol/L (ref 3.5–5.1)
Sodium: 135 mmol/L (ref 135–145)

## 2015-11-28 LAB — CBC
HEMATOCRIT: 36 % — AB (ref 39.0–52.0)
HEMOGLOBIN: 12 g/dL — AB (ref 13.0–17.0)
MCH: 31 pg (ref 26.0–34.0)
MCHC: 33.3 g/dL (ref 30.0–36.0)
MCV: 93 fL (ref 78.0–100.0)
PLATELETS: 190 10*3/uL (ref 150–400)
RBC: 3.87 MIL/uL — ABNORMAL LOW (ref 4.22–5.81)
RDW: 16.8 % — ABNORMAL HIGH (ref 11.5–15.5)
WBC: 8.5 10*3/uL (ref 4.0–10.5)

## 2015-11-28 LAB — URINE CULTURE

## 2015-11-28 MED ORDER — SODIUM CHLORIDE 0.9 % IV SOLN
INTRAVENOUS | Status: DC
Start: 2015-11-28 — End: 2015-11-28

## 2015-11-28 MED ORDER — HYDRALAZINE HCL 20 MG/ML IJ SOLN
5.0000 mg | Freq: Four times a day (QID) | INTRAMUSCULAR | Status: DC | PRN
  Administered 2015-11-28: 5 mg via INTRAVENOUS
  Filled 2015-11-28: qty 1

## 2015-11-28 MED ORDER — PREDNISONE 5 MG PO TABS
5.0000 mg | ORAL_TABLET | Freq: Every day | ORAL | Status: DC
  Administered 2015-11-28 – 2015-12-01 (×3): 5 mg via ORAL
  Filled 2015-11-28 (×3): qty 1

## 2015-11-28 NOTE — Progress Notes (Signed)
[facility] PROGRESS NOTE   [name] [id number] DOB: [birth year] DOA: 11/26/2015 PCP: [name], MD  HPI/Subjective: Feels much better than yesterday.  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 This is secondary to influenza type B infection, started on Tamiflu. His CXR, UA negative for acute findings. Continue supportive management with bronchodilators, mucolytics, antitussives and oxygen as needed.  Sepsis Present on admission with fever of 103.5 and heart rate of 104 and the presence of viral infection. Initial SBP was 160 but dropped to 90, this is improved with IV fluids. Sepsis physiology resolved. No evidence of end organ damage lactic acid is 1.0.  AKI on CKD 3 Baseline creatinine about 1.12, presented with creatinine of 1.5. This is improved to 1.25 after IV fluid. Check BMP in a.m.  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 Disposition Plan: Remains inpatient Diet: Diet regular Room service appropriate?: Yes; Fluid consistency:: Thin  Consultants:  None S2 none if 2 antiviral  Procedures:  None  Antibiotics:  On antiviral, Tamiflu.   Objective: Filed Vitals:   11/27/15 2209 11/28/15 0638  BP: 130/54 166/72  Pulse: 80 88  Temp: 99.3 F (37.4 C) 99.6 F (37.6 C)  Resp: 18 18    Intake/Output Summary (Last 24 hours) at 11/28/15 1307 Last data filed at 11/28/15 0900  Gross per 24 hour  Intake    700 ml  Output   1500 ml  Net   -800 ml   There were no vitals filed for this  visit.  Exam: General: Alert and awake, oriented x3, not in any acute distress. HEENT: anicteric sclera, pupils reactive to light and accommodation, EOMI CVS: S1-S2 clear, no murmur rubs or gallops Chest: clear to auscultation bilaterally, no wheezing, rales or rhonchi Abdomen: soft nontender, nondistended, normal bowel sounds, no organomegaly Extremities: no cyanosis, clubbing or edema noted bilaterally Neuro: Cranial nerves II-XII intact, no focal neurological deficits  Data Reviewed: Basic Metabolic Panel:  Recent Labs Lab 11/27/15 0027 11/28/15 0406  NA 135 135  K 4.2 4.4  CL 98* 104  CO2 28 22  GLUCOSE 94 76  BUN 21* 15  CREATININE 1.52* 1.25*  CALCIUM 9.0 7.9*   Liver Function Tests:  Recent Labs Lab 11/27/15 0027  AST 27  ALT 22  ALKPHOS 49  BILITOT 0.9  PROT 6.4*  ALBUMIN 3.7   No results for input(s): LIPASE, AMYLASE in the last 168 hours. No results for input(s): AMMONIA in the last 168 hours. CBC:  Recent Labs Lab 11/27/15 0355 11/28/15 0406  WBC 11.6* 8.5  HGB 10.4* 12.0*  HCT 32.2* 36.0*  MCV 93.6 93.0  PLT 195 190   Cardiac Enzymes: No results for input(s): CKTOTAL, CKMB, CKMBINDEX, TROPONINI in the last 168 hours. BNP (last 3 results) No results for input(s): BNP in the last 8760 hours.  ProBNP (last 3 results) No results for input(s): PROBNP in the last 8760 hours.  CBG: No results for input(s): GLUCAP in the last 168 hours.  Micro Recent Results (from the past 240 hour(s))  Urine culture  Status: None   Collection Time: 11/27/15 12:19 AM  Result Value Ref Range Status   Specimen Description URINE, CATHETERIZED  Final   Special Requests NONE  Final   Culture   Final    MULTIPLE SPECIES PRESENT, SUGGEST RECOLLECTION Performed at [facility]    Report Status 11/28/2015 FINAL  Final     Studies: Dg Chest 2 View  11/27/2015  CLINICAL DATA:  82 year old male with history of renal cell carcinoma presenting with fever  EXAM: CHEST  2 VIEW COMPARISON:  Chest radiograph dated 10/20/2015 FINDINGS: Two views of chest demonstrate emphysematous changes of the lungs with bibasilar atelectatic changes. There is no focal consolidation, pleural effusion, or pneumothorax. There is stable mild cardiomegaly. There is osteopenia with degenerative changes of the spine. Right shoulder arthroplasty. No acute fracture IMPRESSION: No active cardiopulmonary disease. Electronically Signed   By: Anner Crete M.D.   On: 11/27/2015 00:00    Scheduled Meds: . finasteride  5 mg Oral q morning - 10a  . heparin  5,000 Units Subcutaneous 3 times per day  . levothyroxine  88 mcg Oral QAC breakfast  . oseltamivir  30 mg Oral BID  . pravastatin  80 mg Oral QPM  . predniSONE  2.5 mg Oral QPC supper  . predniSONE  5 mg Oral Daily   Continuous Infusions: . sodium chloride 100 mL/hr at 11/27/15 1929       Time spent: 35 minutes    [facility] A  [facility] Pager [phone] If 7PM-7AM, please contact night-coverage at [email or web address], password [email or web address] 11/28/2015, 1:07 PM  LOS: 1 day
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
## 2015-11-29 ENCOUNTER — Inpatient Hospital Stay (HOSPITAL_COMMUNITY): Payer: PPO

## 2015-11-29 LAB — BASIC METABOLIC PANEL
Anion gap: 11 (ref 5–15)
BUN: 14 mg/dL (ref 6–20)
CALCIUM: 8.2 mg/dL — AB (ref 8.9–10.3)
CO2: 23 mmol/L (ref 22–32)
Chloride: 101 mmol/L (ref 101–111)
Creatinine, Ser: 1.21 mg/dL (ref 0.61–1.24)
GFR calc Af Amer: 59 mL/min — ABNORMAL LOW (ref >60)
GFR, EST NON AFRICAN AMERICAN: 51 mL/min — AB (ref >60)
Glucose, Bld: 63 mg/dL — ABNORMAL LOW (ref 65–99)
POTASSIUM: 3.8 mmol/L (ref 3.5–5.1)
SODIUM: 135 mmol/L (ref 135–145)

## 2015-11-29 NOTE — Progress Notes (Signed)
[facility] PROGRESS NOTE   [name] [id number] DOB: 12-10-1922 DOA: 11/26/2015 PCP: [name], MD  HPI/Subjective: No fever this morning but has severe nausea, KUB obtained and showed no acute abnormalities. This is likely secondary to the flu. Blood pressure elevated, use hydralazine as needed.  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 This is secondary to influenza type B infection, started on Tamiflu. His CXR, UA negative for acute findings. Continue supportive management with bronchodilators, mucolytics, antitussives and oxygen as needed.  Nausea This is likely secondary to the flu, KUB showed no evidence of acute abnormalities.  Sepsis Present on admission with fever of 103.5 and heart rate of 104 and the presence of viral infection. Initial SBP was 160 but dropped to 90, this is improved with IV fluids. Sepsis physiology resolved. No evidence of end organ damage lactic acid is 1.0.  AKI on CKD 3 Baseline creatinine about 1.12, presented with creatinine of 1.5. This is improved to 1.25 after IV fluid. Check BMP in a.m.  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 Disposition Plan: Remains inpatient Diet: Diet Heart Room service appropriate?: Yes; Fluid consistency:: Thin  Consultants:  None S2 none if 2 antiviral  Procedures:  None  Antibiotics:  On antiviral, Tamiflu.   Objective: Filed Vitals:   11/28/15 2000 11/29/15 0448  BP: 158/64 168/72  Pulse: 86 96  Temp: 98.7 F (37.1 C)  99.2 F (37.3 C)  Resp: 16 18    Intake/Output Summary (Last 24 hours) at 11/29/15 1116 Last data filed at 11/29/15 0900  Gross per 24 hour  Intake    120 ml  Output   2600 ml  Net  -2480 ml   Filed Weights   11/28/15 1800  Weight: 79.878 kg (176 lb 1.6 oz)    Exam: General: Alert and awake, oriented x3, not in any acute distress. HEENT: anicteric sclera, pupils reactive to light and accommodation, EOMI CVS: S1-S2 clear, no murmur rubs or gallops Chest: clear to auscultation bilaterally, no wheezing, rales or rhonchi Abdomen: soft nontender, nondistended, normal bowel sounds, no organomegaly Extremities: no cyanosis, clubbing or edema noted bilaterally Neuro: Cranial nerves II-XII intact, no focal neurological deficits  Data Reviewed: Basic Metabolic Panel:  Recent Labs Lab 11/27/15 0027 11/28/15 0406 11/29/15 0350  NA 135 135 135  K 4.2 4.4 3.8  CL 98* 104 101  CO2 28 22 23   GLUCOSE 94 76 63*  BUN 21* 15 14  CREATININE 1.52* 1.25* 1.21  CALCIUM 9.0 7.9* 8.2*   Liver Function Tests:  Recent Labs Lab 11/27/15 0027  AST 27  ALT 22  ALKPHOS 49  BILITOT 0.9  PROT 6.4*  ALBUMIN 3.7   No results for input(s): LIPASE, AMYLASE in the last 168 hours. No results for input(s): AMMONIA in the last 168 hours. CBC:  Recent Labs Lab 11/27/15 0355 11/28/15 0406  WBC 11.6* 8.5  HGB 10.4* 12.0*  HCT 32.2* 36.0*  MCV 93.6 93.0  PLT 195 190   Cardiac Enzymes: No results for input(s): CKTOTAL, CKMB, CKMBINDEX, TROPONINI in the last 168  hours. BNP (last 3 results) No results for input(s): BNP in the last 8760 hours.  ProBNP (last 3 results) No results for input(s): PROBNP in the last 8760 hours.  CBG: No results for input(s): GLUCAP in the last 168 hours.  Micro Recent Results (from the past 240 hour(s))  Urine culture     Status: None   Collection Time: 11/27/15 12:19 AM  Result Value Ref Range Status   Specimen Description URINE, CATHETERIZED  Final    Special Requests NONE  Final   Culture   Final    MULTIPLE SPECIES PRESENT, SUGGEST RECOLLECTION Performed at [facility]    Report Status 11/28/2015 FINAL  Final  Culture, blood (routine x 2)     Status: None (Preliminary result)   Collection Time: 11/27/15 12:27 AM  Result Value Ref Range Status   Specimen Description BLOOD LEFT ANTECUBITAL  Final   Special Requests BOTTLES DRAWN AEROBIC AND ANAEROBIC 5CC  Final   Culture   Final    NO GROWTH 1 DAY Performed at [facility]    Report Status PENDING  Incomplete  Culture, blood (routine x 2)     Status: None (Preliminary result)   Collection Time: 11/27/15 12:35 AM  Result Value Ref Range Status   Specimen Description BLOOD RIGHT ANTECUBITAL  Final   Special Requests BOTTLES DRAWN AEROBIC AND ANAEROBIC 5CC  Final   Culture   Final    NO GROWTH 1 DAY Performed at [facility]    Report Status PENDING  Incomplete     Studies: Dg Abd 1 View  11/29/2015  CLINICAL DATA:  Nausea.  History of renal cell carcinoma. EXAM: ABDOMEN - 1 VIEW COMPARISON:  07/24/2015 FINDINGS: The bowel gas pattern is unremarkable. Dilated bowel loops are identified. Surgical clips overlying the right abdomen noted. No suspicious calcifications are identified. Mild -moderate degenerative changes in the lumbar spine and hips noted. IMPRESSION: No acute abnormality.  Unremarkable bowel gas pattern. Electronically Signed   By: Margarette Canada M.D.   On: 11/29/2015 09:25    Scheduled Meds: . finasteride  5 mg Oral q morning - 10a  . heparin  5,000 Units Subcutaneous 3 times per day  . levothyroxine  88 mcg Oral QAC breakfast  . oseltamivir  30 mg Oral BID  . pravastatin  80 mg Oral QPM  . predniSONE  2.5 mg Oral QPC supper  . predniSONE  5 mg Oral Daily   Continuous Infusions:       Time spent: 35 minutes    [facility] A  [facility] Pager [phone] If 7PM-7AM, please contact night-coverage at [email or web address], password  [facility] 11/29/2015, 11:16 AM  LOS: 2 days

## 2015-11-29 NOTE — NC FL2 (Signed)
[place] LEVEL OF CARE SCREENING TOOL     IDENTIFICATION  Patient Name: [name] [name]. Birthdate: 1923-05-17 Sex: male Admission Date (Current Location): 11/26/2015  [facility] and [place] Number:  Herbalist and Address:  [facility],  [place] [address] [place]      Provider Number: [id number]  Attending Physician Name and Address:  Verlee Monte, MD  Relative Name and Phone Number:       Current Level of Care: Hospital Recommended Level of Care: [place] Prior Approval Number:    Date Approved/Denied:   PASRR Number:    Discharge Plan: SNF    Current Diagnoses: Patient Active Problem List   Diagnosis Date Noted  . Fever 11/27/2015  . Hypotension 11/27/2015  . CKD (chronic kidney disease) stage 3, GFR 30-59 ml/min 11/27/2015  . AKI (acute kidney injury) ([place]) 11/27/2015  . Cellulitis of leg, right 10/24/2015  . Sepsis ([place]) 10/21/2015  . Febrile illness   . S/P total knee arthroplasty 07/13/2015  . OA (osteoarthritis) of knee 06/29/2015  . Lateral meniscal tear 02/05/2015  . Pneumonia 10/28/2013  . Hyponatremia 10/20/2013  . Volume depletion 10/20/2013  . Abdominal pain 10/20/2013  . Pancreatitis 10/20/2013  . Dehydration 10/20/2013  . Weakness 10/19/2013  . Hypertension   . 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 Hypothyroidism (acquired)   . Anemia, B12 deficiency   . Colitis   . Arthritis of shoulder region, right 12/15/2011    Orientation RESPIRATION BLADDER Height & Weight     Self, Time, Situation, Place  O2 (1L [place]) External catheter Weight: 176 lb 1.6 oz (79.878 kg) Height:  5\' 11"  (180.3 cm)  BEHAVIORAL SYMPTOMS/MOOD NEUROLOGICAL BOWEL NUTRITION STATUS      Incontinent Diet (see DC summary)  AMBULATORY STATUS COMMUNICATION OF NEEDS Skin   Limited Assist Verbally Normal                       Personal Care Assistance Level of Assistance  Bathing, Dressing Bathing  Assistance: Limited assistance   Dressing Assistance: Limited assistance     Functional Limitations Info  Hearing, Sight Sight Info: Impaired Hearing Info: Impaired      SPECIAL CARE FACTORS FREQUENCY  PT (By licensed PT), OT (By licensed OT)     PT Frequency: 5/wk OT Frequency: 5/wk            Contractures      Additional Factors Info  Code Status, Allergies, Isolation Precautions Code Status Info: DNR Allergies Info: Percocet, Diovan     Isolation Precautions Info: droplet     Current Medications (11/29/2015):  This is the current hospital active medication list Current Facility-Administered Medications  Medication Dose Route Frequency Provider Last Rate Last Dose  . acetaminophen (TYLENOL) tablet 650 mg  650 mg Oral Q6H PRN Debbe Odea, MD   650 mg at 11/27/15 1217   Or  . acetaminophen (TYLENOL) suppository 650 mg  650 mg Rectal Q6H PRN Debbe Odea, MD      . acetaminophen (TYLENOL) tablet 650 mg  650 mg Oral Q6H PRN Debbe Odea, MD      . finasteride (PROSCAR) tablet 5 mg  5 mg Oral q morning - 10a Saima Rizwan, MD   5 mg at 11/29/15 1009  . heparin injection 5,000 Units  5,000 Units Subcutaneous 3 times per day Debbe Odea, MD   5,000 Units at 11/29/15 0622  . hydrALAZINE (APRESOLINE) injection  5 mg  5 mg Intravenous Q6H PRN Verlee Monte, MD   5 mg at 11/28/15 1505  . levothyroxine (SYNTHROID, LEVOTHROID) tablet 88 mcg  88 mcg Oral QAC breakfast Debbe Odea, MD   88 mcg at 11/29/15 1009  . ondansetron (ZOFRAN) tablet 4 mg  4 mg Oral Q6H PRN Debbe Odea, MD       Or  . ondansetron (ZOFRAN) injection 4 mg  4 mg Intravenous Q6H PRN Debbe Odea, MD   4 mg at 11/29/15 0616  . oseltamivir (TAMIFLU) capsule 30 mg  30 mg Oral BID Debbe Odea, MD   30 mg at 11/29/15 1009  . pravastatin (PRAVACHOL) tablet 80 mg  80 mg Oral QPM Debbe Odea, MD   80 mg at 11/28/15 1711  . predniSONE (DELTASONE) tablet 2.5 mg  2.5 mg Oral QPC supper Debbe Odea, MD   2.5 mg at  11/27/15 1730  . predniSONE (DELTASONE) tablet 5 mg  5 mg Oral Daily Debbe Odea, MD   5 mg at 11/28/15 [id number]     Discharge Medications: Please see discharge summary for a list of discharge medications.  Relevant Imaging Results:  Relevant Lab Results:   Additional Information SS#: [id number]  [place] Mon, [place]

## 2015-11-30 ENCOUNTER — Encounter (HOSPITAL_BASED_OUTPATIENT_CLINIC_OR_DEPARTMENT_OTHER): Payer: PPO | Attending: Internal Medicine

## 2015-11-30 DIAGNOSIS — J111 Influenza due to unidentified influenza virus with other respiratory manifestations: Secondary | ICD-10-CM | POA: Diagnosis present

## 2015-11-30 NOTE — Progress Notes (Signed)
[facility] PROGRESS NOTE   [name] [id number] DOB: 22-Jan-1923 DOA: 11/26/2015 PCP: [name], MD  HPI/Subjective: Had severe nausea yesterday, improved. Seen by PT, SNF recommended, to be discharged in a.m.  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 This is secondary to influenza type B infection, started on Tamiflu. His CXR, UA negative for acute findings. Continue supportive management with bronchodilators, mucolytics, antitussives and oxygen as needed. SNF recommended, CSW to help with placement.  Nausea This is likely secondary to the flu, KUB showed no evidence of acute abnormalities. This is can be from the influenza itself or could be secondary to the Tamiflu  Sepsis Present on admission with fever of 103.5 and heart rate of 104 and the presence of viral infection. Initial SBP was 160 but dropped to 90, this is improved with IV fluids. Sepsis physiology resolved. No evidence of end organ damage lactic acid is 1.0.  AKI on CKD 3 Baseline creatinine about 1.12, presented with creatinine of 1.5. This is improved to 1.25 after IV fluid. Check BMP in a.m.  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 Disposition Plan: Remains inpatient Diet: Diet Heart Room service appropriate?: Yes; Fluid consistency:: Thin  Consultants:  None S2 none if 2 antiviral  Procedures:  None  Antibiotics:  On antiviral, Tamiflu.   Objective: Filed Vitals:   11/29/15 2027 11/30/15 0508  BP: 173/66 154/69  Pulse:  85 76  Temp: 99.7 F (37.6 C) 98.8 F (37.1 C)  Resp: 16 16    Intake/Output Summary (Last 24 hours) at 11/30/15 1333 Last data filed at 11/30/15 1117  Gross per 24 hour  Intake     60 ml  Output   2000 ml  Net  -1940 ml   Filed Weights   11/28/15 1800  Weight: 79.878 kg (176 lb 1.6 oz)    Exam: General: Alert and awake, oriented x3, not in any acute distress. HEENT: anicteric sclera, pupils reactive to light and accommodation, EOMI CVS: S1-S2 clear, no murmur rubs or gallops Chest: clear to auscultation bilaterally, no wheezing, rales or rhonchi Abdomen: soft nontender, nondistended, normal bowel sounds, no organomegaly Extremities: no cyanosis, clubbing or edema noted bilaterally Neuro: Cranial nerves II-XII intact, no focal neurological deficits  Data Reviewed: Basic Metabolic Panel:  Recent Labs Lab 11/27/15 0027 11/28/15 0406 11/29/15 0350  NA 135 135 135  K 4.2 4.4 3.8  CL 98* 104 101  CO2 28 22 23   GLUCOSE 94 76 63*  BUN 21* 15 14  CREATININE 1.52* 1.25* 1.21  CALCIUM 9.0 7.9* 8.2*   Liver Function Tests:  Recent Labs Lab 11/27/15 0027  AST 27  ALT 22  ALKPHOS 49  BILITOT 0.9  PROT 6.4*  ALBUMIN 3.7   No results for input(s): LIPASE, AMYLASE in the last 168 hours. No results for input(s): AMMONIA in the last 168 hours. CBC:  Recent Labs Lab 11/27/15 0355 11/28/15 0406  WBC 11.6* 8.5  HGB 10.4* 12.0*  HCT 32.2* 36.0*  MCV 93.6 93.0  PLT 195 190   Cardiac Enzymes: No results for  input(s): CKTOTAL, CKMB, CKMBINDEX, TROPONINI in the last 168 hours. BNP (last 3 results) No results for input(s): BNP in the last 8760 hours.  ProBNP (last 3 results) No results for input(s): PROBNP in the last 8760 hours.  CBG: No results for input(s): GLUCAP in the last 168 hours.  Micro Recent Results (from the past 240 hour(s))  Urine culture     Status: None   Collection Time: 11/27/15 12:19 AM  Result Value Ref Range Status   Specimen  Description URINE, CATHETERIZED  Final   Special Requests NONE  Final   Culture   Final    MULTIPLE SPECIES PRESENT, SUGGEST RECOLLECTION Performed at [facility]    Report Status 11/28/2015 FINAL  Final  Culture, blood (routine x 2)     Status: None (Preliminary result)   Collection Time: 11/27/15 12:27 AM  Result Value Ref Range Status   Specimen Description BLOOD LEFT ANTECUBITAL  Final   Special Requests BOTTLES DRAWN AEROBIC AND ANAEROBIC 5CC  Final   Culture   Final    NO GROWTH 3 DAYS Performed at [facility]    Report Status PENDING  Incomplete  Culture, blood (routine x 2)     Status: None (Preliminary result)   Collection Time: 11/27/15 12:35 AM  Result Value Ref Range Status   Specimen Description BLOOD RIGHT ANTECUBITAL  Final   Special Requests BOTTLES DRAWN AEROBIC AND ANAEROBIC 5CC  Final   Culture   Final    NO GROWTH 3 DAYS Performed at [facility]    Report Status PENDING  Incomplete     Studies: Dg Abd 1 View  11/29/2015  CLINICAL DATA:  Nausea.  History of renal cell carcinoma. EXAM: ABDOMEN - 1 VIEW COMPARISON:  07/24/2015 FINDINGS: The bowel gas pattern is unremarkable. Dilated bowel loops are identified. Surgical clips overlying the right abdomen noted. No suspicious calcifications are identified. Mild -moderate degenerative changes in the lumbar spine and hips noted. IMPRESSION: No acute abnormality.  Unremarkable bowel gas pattern. Electronically Signed   By: Margarette Canada M.D.   On: 11/29/2015 09:25    Scheduled Meds: . finasteride  5 mg Oral q morning - 10a  . heparin  5,000 Units Subcutaneous 3 times per day  . levothyroxine  88 mcg Oral QAC breakfast  . oseltamivir  30 mg Oral BID  . pravastatin  80 mg Oral QPM  . predniSONE  2.5 mg Oral QPC supper  . predniSONE  5 mg Oral Daily   Continuous Infusions:       Time spent: 35 minutes    [facility] A  [facility] Pager [phone] If 7PM-7AM, please contact  night-coverage at [email or web address], password [facility] 11/30/2015, 1:33 PM  LOS: 3 days

## 2015-12-01 DIAGNOSIS — A419 Sepsis, unspecified organism: Principal | ICD-10-CM

## 2015-12-01 DIAGNOSIS — J111 Influenza due to unidentified influenza virus with other respiratory manifestations: Secondary | ICD-10-CM

## 2015-12-01 MED ORDER — OSELTAMIVIR PHOSPHATE 30 MG PO CAPS: 30.0000 mg | ORAL_CAPSULE | Freq: Two times a day (BID) | ORAL | Status: DC

## 2015-12-01 NOTE — Care Management Important Message (Signed)
Important Message  Patient Details IM  Letter given to Nora/Case Manager to present to Patient Name: [name] [name]. MRN: [id number] Date of Birth: [birth year]   Medicare Important Message Given:  Yes    Camillo Flaming 12/01/2015, 9:33 AMImportant Message  Patient Details  Name: [name] [name]. MRN: [id number] Date of Birth: [birth year]   Medicare Important Message Given:  Yes    Camillo Flaming 12/01/2015, 9:33 AM

## 2015-12-01 NOTE — Progress Notes (Signed)
Physical Therapy Treatment Patient Details Name: [name] [name]. MRN: [id number] DOB: February 06, 1923 Today's Date: 12/01/2015    History of Present Illness 82 year old Caucasian male with a past medical history of hypertension, arthritis, B-12 deficiency, BPH, RTKA, shoulder replacement, presented from his ILF ([facility])  with the fever of 101F and hypotension with blood pressure in the 80s.    PT Comments    Pt OOB in recliner.  Required + 2 assist for amb for safety.  Great difficulty initiating mvt.  Severe posterior LOB.  X 3 attempts before avble to self perform sit to stand. MAX c/o feeling "weak".  Noted decreased activity tolerance. Pt will need ST Rehab at SNF.  Follow Up Recommendations  SNF     Equipment Recommendations  None recommended by PT    Recommendations for Other Services       Precautions / Restrictions Precautions Precautions: Fall Precaution Comments: droplet precaution Restrictions Weight Bearing Restrictions: No    Mobility  Bed Mobility               General bed mobility comments: Pt OOB in recliner  Transfers Overall transfer level: Needs assistance Equipment used: Rolling walker (2 wheeled) Transfers: Sit to/from Stand Sit to Stand: Mod assist;Max assist         General transfer comment: severe posterior lean and initial c/o dizziness with much anticipation.  50% VC's to forward lean slightly to decrease posterior lean.  Required x 3 attempts for pt to self perform.   Ambulation/Gait Ambulation/Gait assistance: Mod assist;Min assist;+2 safety/equipment Ambulation Distance (Feet): 72 Feet Assistive device: Rolling walker (2 wheeled) Gait Pattern/deviations: Step-through pattern;Decreased stride length;Shuffle;Decreased step length - left;Decreased step length - right Gait velocity: decreased   General Gait Details: very slow gait with posterior lean.  Great difficulty with initiation.  MAX c/o feeling "weak".  Limited activity  tolerance compaired to PLOF.    Stairs            Wheelchair Mobility    Modified Rankin (Stroke Patients Only)       Balance                                    Cognition Arousal/Alertness: Awake/alert Behavior During Therapy: WFL for tasks assessed/performed Overall Cognitive Status: Within Functional Limits for tasks assessed                      Exercises      General Comments        Pertinent Vitals/Pain Pain Assessment: No/denies pain    Home Living                      Prior Function            PT Goals (current goals can now be found in the care plan section) Progress towards PT goals: Progressing toward goals    Frequency  Min 2X/week    PT Plan Current plan remains appropriate    Co-evaluation             End of Session Equipment Utilized During Treatment: Gait belt Activity Tolerance: Patient limited by fatigue Patient left: in chair;with call bell/phone within reach;with chair alarm set     Time: 1305-1320 PT Time Calculation (min) (ACUTE ONLY): 15 min  Charges:  $Gait Training: 8-22 mins  G Codes:      Rica Koyanagi  PTA [facility]  Acute  Rehab Pager      [phone]

## 2015-12-01 NOTE — Clinical Social Work Note (Signed)
Clinical Social Work Assessment  Patient Details  Name: [name] [name]. MRN: [id number] Date of Birth: 07/12/1923  Date of referral:  12/01/15               Reason for consult:  Discharge Planning                Permission sought to share information with:  Family Supports Permission granted to share information::     Name::        Agency::     Relationship::     Contact Information:     Housing/Transportation Living arrangements for the past 2 months:  [place] of Information:  Patient Patient Interpreter Needed:  None Criminal Activity/Legal Involvement Pertinent to Current Situation/Hospitalization:  No - Comment as needed Significant Relationships:  Adult Children Lives with:  Facility Resident Do you feel safe going back to the place where you live?  No Need for family participation in patient care:  No (Coment)  Care giving concerns:  Pt admitted from [facility] IL[facility]. Pt weak due to acute medical conditions. PT recommending rehab (SNF) level of care.   Social Worker assessment / plan:    CSW received referral that pt admitted from [facility] ILF. PT recommending SNF level at [facility].   CSW spoke with [facility] and facility planned to speak with pt regarding plan to ensure pt needs are met.  CSW received return phone call from [facility] admissions at [facility] who spoke with pt and they have arranged for pt to come to skilled part of [facility]. [facility] can provide transportation this afternoon.  CSW met with pt at bedside. CSW provided support as pt discussed that he feels very weak. Pt discussed that he understands plan for [facility] this afternoon.  CSW to facilitate pt discharge needs this afternoon.   Employment status:  Retired Nurse, adult PT Recommendations:  [place] / Referral to community resources:  [place]  Patient/Family's Response to  care:  Pt alert and oriented x 4. Pt hopeful to regain strength at SNF part of [facility].   Patient/Family's Understanding of and Emotional Response to Diagnosis, Current Treatment, and Prognosis:  Pt displayed understanding surrounding diagnosis and treatment plan.   Emotional Assessment Appearance:  Appears stated age Attitude/Demeanor/Rapport:  Other (apppropriate) Affect (typically observed):  Appropriate Orientation:  Oriented to Self, Oriented to Place, Oriented to  Time, Oriented to Situation Alcohol / Substance use:  Not Applicable Psych involvement (Current and /or in the community):  No (Comment)  Discharge Needs  Concerns to be addressed:  Discharge Planning Concerns Readmission within the last 30 days:  No Current discharge risk:  None Barriers to Discharge:  No Barriers Identified   [place], [place], LCSW 12/01/2015, 10:47 AM  [phone]

## 2015-12-01 NOTE — Progress Notes (Signed)
Pt for discharge to [facility] SNF.   CSW facilitated pt discharge needs including contacting facility, faxing pt discharge information via epic hub, discussing with pt at bedside, pt grandson present, providing RN phone number to call report, and [facility] wheelchair transportation plans to arrive to hospital to transport pt at 3 pm.   Discharge packet provided to pt at bedside to provide to [facility] upon arrival.   No further social work needs identified at this time.  CSW signing off.   Alison Murray, MSW, [place] [facility] [phone]

## 2015-12-01 NOTE — Discharge Summary (Signed)
Physician Discharge Summary  [name] [name]. [id number] DOB: 1923-08-04 DOA: 11/26/2015  PCP: [name], MD  Admit date: 11/26/2015 Discharge date: 12/01/2015  Time spent: 40 minutes  Recommendations for Outpatient Follow-up:  1. Follow-up with primary care physician within one week.  2. Discharging to [place] skilled nursing facility. 3. Continue Tamiflu for only one more day, last dose on 12/02/2015.  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 is a 82 y.o. male with h/o pituitary mass s/p resection, renal cell cancer s/p nephrectomy presenting from independent living with a fever of 102.9 degrees and a fall. He had nausea a couple of days ago. Cannot tell me if he has had decreased PO intake. Apparently there has been a flu outbreak at his facility. No cough, runny nose, sore throat. No diarrhea. I have opened the dressing on his left leg- no cellulitis. He states he goes to the wound care center to have it managed by Dr Dellia Nims. Was recently treated for a cellulitis it appears from med/rec with Levaquin and Doxycycline.   Hospital Course:   Influenza type B respiratory infection Patient presented with fever of 103.5 and shortness of breath. This is secondary to influenza type B infection, started on Tamiflu. His CXR, UA negative for acute findings. Continue supportive management with bronchodilators, mucolytics, antitussives and oxygen as needed. Continue Tamiflu for 1 more day, last dose is 12/02/2015  Nausea This is likely secondary to the flu, KUB  showed no evidence of acute abnormalities. This is can be from the influenza itself or could be secondary to the Tamiflu, this is resolved. There was no hypotension to suggest addisonian crisis (has hypopituitarism)  Sepsis Present on admission with fever of 103.5 and heart rate of 104 and the presence of viral infection. Initial SBP was 160 but dropped to 90, this is improved with IV fluids. Sepsis physiology resolved. No evidence of end organ damage lactic acid is 1.0.  AKI on CKD 3 Baseline creatinine about 1.12, presented with creatinine of 1.5. This is improved to 1.21 after IV fluid. Check BMP in a.m.  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 HEENT: anicteric sclera, pupils reactive to light and accommodation, EOMI CVS: S1-S2 clear, no murmur rubs or gallops Chest: clear to auscultation bilaterally, no wheezing, rales or rhonchi Abdomen: soft nontender, nondistended, normal bowel sounds, no organomegaly Extremities: no cyanosis, clubbing or edema noted bilaterally Neuro: Cranial nerves II-XII intact, no focal neurological deficits  Discharge Instructions   Discharge Instructions    Diet - low sodium heart healthy    Complete by:  As directed      Increase activity slowly  Complete by:  As directed           Current Discharge Medication List    START taking these medications   Details  oseltamivir (TAMIFLU) 30 MG capsule Take 1 capsule (30 mg total) by mouth 2 (two) times daily. Qty: 2 capsule, Refills: 0      CONTINUE these medications which have NOT CHANGED   Details  acetaminophen  (TYLENOL) 325 MG tablet Take 2 tablets (650 mg total) by mouth every 6 (six) hours as needed for mild pain or fever.    co-enzyme Q-10 30 MG capsule Take 30 mg by mouth daily.    finasteride (PROSCAR) 5 MG tablet Take 5 mg by mouth every morning.     levothyroxine (SYNTHROID, LEVOTHROID) 88 MCG tablet Take 88 mcg by mouth daily.    Multiple Vitamins-Minerals (MULTIVITAMIN & MINERAL PO) Take 1 tablet by mouth daily.    pravastatin (PRAVACHOL) 80 MG tablet Take 80 mg by mouth every evening.     predniSONE (DELTASONE) 2.5 MG tablet Take 2.5-5 mg by mouth 2 (two) times daily with a meal. Take 2 tablets (5 mg) in the am and Take 1 tablet (2.5 mg) in the evening.    testosterone cypionate (DEPO-TESTOSTERONE) 100 MG/ML injection Inject 0.6ml intramuscularly once weekly Qty: 10 mL, Refills: 0    vitamin B-12 (CYANOCOBALAMIN) 1000 MCG tablet Take 1,000 mcg by mouth daily.    VITAMIN D, ERGOCALCIFEROL, PO Take 2,000 Units by mouth daily.        Allergies  Allergen Reactions  . Percocet [Oxycodone-Acetaminophen] Other (See Comments)    Just doesn't like it  . Diovan [Valsartan] Rash   Follow-up Information    Follow up with [name],MICHAEL D, MD In 1 week.   Specialty:  Endocrinology   Contact information:   [place] [address] [phone]        The results of significant diagnostics from this hospitalization (including imaging, microbiology, ancillary and laboratory) are listed below for reference.    Significant Diagnostic Studies: Dg Chest 2 View  11/27/2015  CLINICAL DATA:  82 year old male with history of renal cell carcinoma presenting with fever EXAM: CHEST  2 VIEW COMPARISON:  Chest radiograph dated 10/20/2015 FINDINGS: Two views of chest demonstrate emphysematous changes of the lungs with bibasilar atelectatic changes. There is no focal consolidation, pleural effusion, or pneumothorax. There is stable mild cardiomegaly. There is osteopenia  with degenerative changes of the spine. Right shoulder arthroplasty. No acute fracture IMPRESSION: No active cardiopulmonary disease. Electronically Signed   By: Anner Crete M.D.   On: 11/27/2015 00:00   Dg Abd 1 View  11/29/2015  CLINICAL DATA:  Nausea.  History of renal cell carcinoma. EXAM: ABDOMEN - 1 VIEW COMPARISON:  07/24/2015 FINDINGS: The bowel gas pattern is unremarkable. Dilated bowel loops are identified. Surgical clips overlying the right abdomen noted. No suspicious calcifications are identified. Mild -moderate degenerative changes in the lumbar spine and hips noted. IMPRESSION: No acute abnormality.  Unremarkable bowel gas pattern. Electronically Signed   By: Margarette Canada M.D.   On: 11/29/2015 09:25    Microbiology: Recent Results (from the past 240 hour(s))  Urine culture     Status: None   Collection Time: 11/27/15 12:19 AM  Result Value Ref Range Status   Specimen Description URINE, CATHETERIZED  Final   Special Requests NONE  Final   Culture   Final    MULTIPLE SPECIES PRESENT, SUGGEST RECOLLECTION Performed at [facility]  Report Status 11/28/2015 FINAL  Final  Culture, blood (routine x 2)     Status: None (Preliminary result)   Collection Time: 11/27/15 12:27 AM  Result Value Ref Range Status   Specimen Description BLOOD LEFT ANTECUBITAL  Final   Special Requests BOTTLES DRAWN AEROBIC AND ANAEROBIC 5CC  Final   Culture   Final    NO GROWTH 3 DAYS Performed at [facility]    Report Status PENDING  Incomplete  Culture, blood (routine x 2)     Status: None (Preliminary result)   Collection Time: 11/27/15 12:35 AM  Result Value Ref Range Status   Specimen Description BLOOD RIGHT ANTECUBITAL  Final   Special Requests BOTTLES DRAWN AEROBIC AND ANAEROBIC 5CC  Final   Culture   Final    NO GROWTH 3 DAYS Performed at [facility]    Report Status PENDING  Incomplete     Labs: Basic Metabolic Panel:  Recent Labs Lab 11/27/15 0027  11/28/15 0406 11/29/15 0350  NA 135 135 135  K 4.2 4.4 3.8  CL 98* 104 101  CO2 28 22 23   GLUCOSE 94 76 63*  BUN 21* 15 14  CREATININE 1.52* 1.25* 1.21  CALCIUM 9.0 7.9* 8.2*   Liver Function Tests:  Recent Labs Lab 11/27/15 0027  AST 27  ALT 22  ALKPHOS 49  BILITOT 0.9  PROT 6.4*  ALBUMIN 3.7   No results for input(s): LIPASE, AMYLASE in the last 168 hours. No results for input(s): AMMONIA in the last 168 hours. CBC:  Recent Labs Lab 11/27/15 0355 11/28/15 0406  WBC 11.6* 8.5  HGB 10.4* 12.0*  HCT 32.2* 36.0*  MCV 93.6 93.0  PLT 195 190   Cardiac Enzymes: No results for input(s): CKTOTAL, CKMB, CKMBINDEX, TROPONINI in the last 168 hours. BNP: BNP (last 3 results) No results for input(s): BNP in the last 8760 hours.  ProBNP (last 3 results) No results for input(s): PROBNP in the last 8760 hours.  CBG: No results for input(s): GLUCAP in the last 168 hours.     Signed:  Birdie Hopes MD.  [facility] 12/01/2015, 11:23 AM

## 2015-12-01 NOTE — Consult Note (Signed)
   [facility] CM Inpatient Consult   12/01/2015  [name] [name]. Jul 13, 1923 [id number]    Patient screened for potential [facility] Care Management services. Chart reviewed. Appears patient is going to SNF at discharge. [facility] Care Management services not appropriate at this time. If patient's post hospital needs change please place a [facility] Care Management consult. For questions please contact:  Marthenia Rolling, [place], RN,BSN [facility] Liaison [phone]

## 2015-12-02 ENCOUNTER — Non-Acute Institutional Stay (SKILLED_NURSING_FACILITY): Payer: PPO | Admitting: Internal Medicine

## 2015-12-02 DIAGNOSIS — N183 Chronic kidney disease, stage 3 unspecified: Secondary | ICD-10-CM

## 2015-12-02 DIAGNOSIS — E236 Other disorders of pituitary gland: Secondary | ICD-10-CM

## 2015-12-02 DIAGNOSIS — R531 Weakness: Secondary | ICD-10-CM

## 2015-12-02 DIAGNOSIS — J111 Influenza due to unidentified influenza virus with other respiratory manifestations: Secondary | ICD-10-CM

## 2015-12-02 DIAGNOSIS — N179 Acute kidney failure, unspecified: Secondary | ICD-10-CM | POA: Diagnosis not present

## 2015-12-02 DIAGNOSIS — E893 Postprocedural hypopituitarism: Secondary | ICD-10-CM

## 2015-12-02 LAB — CULTURE, BLOOD (ROUTINE X 2)
CULTURE: NO GROWTH
CULTURE: NO GROWTH

## 2015-12-02 NOTE — Progress Notes (Signed)
Patient ID: [name] [name]., male   DOB: April 13, 1923, 82 y.o.   MRN: [id number]  Provider:  Rexene Edison. Mariea Clonts, D.O., C.M.D. Location:  [place] Room Number: 120 Place of Service:  SNF (31)  PCP: [name], MD Patient Care Team: Lorne Skeens, MD as PCP - General (Endocrinology) [facility]  Extended Emergency Contact Information Primary Emergency Contact: Houchin,[name] Address: [address]          [place], [place] [address] [name] of [place] Phone: [phone] Mobile Phone: [phone] Relation: Son Secondary Emergency Contact: Gartman,[name] Address: [address]          [place], [place] [address] [name] of [place] Phone: [phone] Relation: Son  Code Status: DNR Goals of Care: Advanced Directive information Advanced Directives 12/15/2015  Does patient have an advance directive? Yes  Type of Advance Directive Out of facility DNR (pink MOST or yellow form);[place];Living will  Copy of advanced directive(s) in chart? Yes  Pre-existing out of facility DNR order (yellow form or pink MOST form) Yellow form placed in chart (order not valid for inpatient use);Pink MOST form placed in chart (order not valid for inpatient use)   Chief Complaint  Patient presents with  . New Admit To SNF    s/p hospitalization from 11/26/15 to 12/01/15 for fever, fall, nausea, poor po intake, hypotension and mild leukocytosis    HPI: Patient is a 82 y.o. male with a h/o pituitary adenoma s/p resection on chronic hormone repletion, prior renal cell ca s/p nephrectomy, fall with severe leg wound seen today for admission to [facility] for rehab s/p hospitalization with what turned out to be influenza B bronchitis. He was treated with vanc and zosyn in the ER and three liters of IVFs, stress dose steroids, bronchodilators.  CXR, UA negative.  He also had mild acute kidney injury.  He  had no signs of cellulitis at his leg wound sites (had recently been treated for cellulitis and is being followed at the wound care center for these wounds by Dr. Dellia Nims).  Influenza B swab was positive. He received mucolytics, antitussives and oxygen as needed.   He completes his last dose of tamiflu here today.  Nausea was attributed to the flu.  He was initially septic, but this improved with his hydration and appropriate antimicrobials.  Cr improved with hydration back to 1.21 from 1.5 with baseline of 1.12.    When seen, he had some confusion, but reported feeling much better than he had yesterday when he arrived back here at [facility].  Apparently, he had gone to the bell-ringing program this am without reporting to nursing and staff had to locate him.  He still is weak, but intake is improving and energy levels.  A cough persists.  He's afebrile.     Past Medical History  Diagnosis Date  . Hypertension   . Arthritis     Status post left total replacement 8 2011  . Pituitary mass ([place]) 2000    S/p transphenoidal excision 05/1999 ([facility])  . 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 Hypothyroidism (acquired) 2000  . Anemia, B12 deficiency 2000  . Hyperlipidemia 2003  . Vitamin D deficiency 2009  . History of renal cell carcinoma 1994    Status post right nephrectomy  . BPH (benign prostatic hyperplasia) 2013  . Glaucoma 2007    Status post left trabeculectomy 2007  .  History of colon polyps     Colonoscopy 2001  . Allergic rhinitis     Prior allergy shots 20 years  . Colitis 2014  . Anemia   . Cancer ([place])     renal cell ca and skin cancer   . Nocturia   . History of kidney stones   . Difficult intubation 1994    surgery had to be  stopped due to injury to "throat"  . Difficult intubation 1994    no trouble since.  Required nasotracheal intubation  '02 [facility] / ANESTHESIA RECORD FROM 2013 AND 2016 IN EPIC   Past Surgical History    Procedure Laterality Date  . Tonsillectomy    . Eye surgery  over last 6 yrs.      trabeculectomy...   . Eye surgery   cat ext ou  . Brain surgery  2000    pituatary gland removed .  Marland Kitchen Joint replacement  2011    knee left  . Cholecystectomy  1984  . Reverse shoulder arthroplasty  12/15/2011    Procedure: REVERSE SHOULDER ARTHROPLASTY;  Surgeon: Marin Shutter, MD;  Location: [place];  Service: Orthopedics;  Laterality: Right;  right total reverse shoulder  . Nephrectomy Right 1994    Renal cell  . Transsphenoidal excision pituitary tumor  05/1999    A.Tommi Rumps, M.D.([facility])  . Ectropion surgery Bilateral 2006  . Mohs procedure       for skin cancer on nose   . Knee arthroscopy Right 02/06/2015    Procedure: RIGHT ARTHROSCOPY KNEE WITH LATERAL MENSICAL  DEBRIDEMENT;  Surgeon: Gaynelle Arabian, MD;  Location: [facility];  Service: Orthopedics;  Laterality: Right;  . Total knee arthroplasty Right 06/29/2015    Procedure: TOTAL KNEE ARTHROPLASTY;  Surgeon: Gaynelle Arabian, MD;  Location: [facility];  Service: Orthopedics;  Laterality: Right;    reports that he quit smoking about 27 years ago. His smoking use included Cigarettes and Pipe. He has never used smokeless tobacco. He reports that he drinks alcohol. He reports that he does not use illicit drugs. Social History   Social History  . Marital Status: Widowed    Spouse Name: N/A  . Number of Children: N/A  . Years of Education: N/A   Occupational History  . Not on file.   Social History Main Topics  . Smoking status: Former Smoker    Types: Cigarettes, Pipe    Quit date: 09/26/1988  . Smokeless tobacco: Never Used  . Alcohol Use: Yes     Comment: 1.5-2 ounces of vodka nightly   . Drug Use: No  . Sexual Activity: Not on file   Other Topics Concern  . Not on file   Social History Narrative   Patient is Married Software engineer). Retired Engineer, mining) Administrator, sports. Lives in apartment, Independent Living section at [facility] since 2000.  Spouse lives in skilled nursing section in same community   Smoking 1994 , moderate alcohol use: 2 drinks a night. Exercises regularly with walking and exercise classes. Continues to drive   Patient has Advanced planning documents: Living Will                Functional Status Survey: as of SNF admission for rehab after hospitalization for flu Is the patient deaf or have difficulty hearing?: Yes Does the patient have difficulty seeing, even when wearing glasses/contacts?: No Does the patient have difficulty concentrating, remembering, or making decisions?: No Does the patient have difficulty walking or climbing stairs?:  Yes Does the patient have difficulty dressing or bathing?: Yes Does the patient have difficulty doing errands alone such as visiting a doctor's office or shopping?: Yes  Family History  Problem Relation Age of Onset  . Anesthesia problems Neg Hx   . Hypotension Neg Hx   . Malignant hyperthermia Neg Hx   . Pseudochol deficiency Neg Hx   . Lung cancer Father     Health Maintenance  Topic Date Due  . TETANUS/TDAP  01/22/1944  . ZOSTAVAX  01/21/1985  . INFLUENZA VACCINE  03/29/2016  . PNA vac Low Risk Adult (2 of 2 - PPSV23) 06/03/2016    Allergies  Allergen Reactions  . Percocet [Oxycodone-Acetaminophen] Other (See Comments)    Just doesn't like it  . Robaxin [Methocarbamol]   . Diovan [Valsartan] Rash      Medication List       This list is accurate as of: 12/02/15 11:59 PM.  Always use your most recent med list.               acetaminophen 325 MG tablet  Commonly known as:  TYLENOL  Take 2 tablets (650 mg total) by mouth every 6 (six) hours as needed for mild pain or fever.     co-enzyme Q-10 30 MG capsule  Take 30 mg by mouth daily.     finasteride 5 MG tablet  Commonly known as:  PROSCAR  Take 5 mg by mouth every morning.     levothyroxine 88 MCG tablet  Commonly known as:  SYNTHROID, LEVOTHROID  Take 88  mcg by mouth daily.     MULTIVITAMIN & MINERAL PO  Take 1 tablet by mouth daily.     oseltamivir 30 MG capsule  Commonly known as:  TAMIFLU  Take 1 capsule (30 mg total) by mouth 2 (two) times daily.     pravastatin 80 MG tablet  Commonly known as:  PRAVACHOL  Take 80 mg by mouth every evening.     predniSONE 2.5 MG tablet  Commonly known as:  DELTASONE  Take 2 tablets (5 mg) in the am and Take 1 tablet (2.5 mg) in the evening.     testosterone cypionate 100 MG/ML injection  Commonly known as:  DEPO-TESTOSTERONE  Inject 0.7ml intramuscularly once weekly     vitamin B-12 1000 MCG tablet  Commonly known as:  CYANOCOBALAMIN  Take 1,000 mcg by mouth daily.     VITAMIN D (ERGOCALCIFEROL) PO  Take 2,000 Units by mouth daily.        Review of Systems  Constitutional: Positive for weight loss and malaise/fatigue. Negative for fever and chills.  HENT: Positive for congestion and hearing loss. Negative for ear pain and sore throat.   Eyes: Negative for blurred vision.       Glasses  Respiratory: Positive for cough, sputum production and shortness of breath. Negative for wheezing.   Cardiovascular: Negative for chest pain, palpitations and leg swelling.  Gastrointestinal: Negative for abdominal pain, constipation, blood in stool and melena.  Genitourinary: Negative for dysuria, urgency and frequency.  Musculoskeletal: Negative for falls.  Skin: Negative for rash.  Neurological: Positive for weakness. Negative for dizziness, loss of consciousness and headaches.  Endo/Heme/Allergies: Bruises/bleeds easily.  Psychiatric/Behavioral: Positive for memory loss. Negative for depression.       Confused today upon return from hospital    Filed Vitals:   12/02/15 1742  BP: 132/83  Pulse: 81  Temp: 98 F (36.7 C)  Resp: 20  Weight: 173 lb (  78.472 kg)  SpO2: 96%   Body mass index is 24.14 kg/(m^2). Physical Exam  Constitutional: He appears well-developed and well-nourished.    HENT:  Head: Normocephalic and atraumatic.  Right Ear: External ear normal.  Left Ear: External ear normal.  Nose: Nose normal.  Mouth/Throat: Oropharynx is clear and moist. No oropharyngeal exudate.  Eyes: Conjunctivae and EOM are normal. Pupils are equal, round, and reactive to light.  glasses  Neck: Neck supple. No JVD present.  Cardiovascular: Normal rate, regular rhythm, normal heart sounds and intact distal pulses.   Pulmonary/Chest: Effort normal.  Coarse wet rhonchi; wet cough  Abdominal: Soft. Bowel sounds are normal. He exhibits no distension. There is no tenderness.  Musculoskeletal: Normal range of motion.  Using walker right now for support  Lymphadenopathy:    He has no cervical adenopathy.  Neurological: He is alert.  Confused and repeating stories which he does not typically do  Skin: Skin is warm and dry.  Psychiatric: He has a normal mood and affect.    Labs reviewed: Basic Metabolic Panel:  Recent Labs  10/20/15 2120  11/27/15 0027 11/28/15 0406 11/29/15 0350 12/15/15  NA 129*  < > 135 135 135 139  K 3.7  < > 4.2 4.4 3.8 4.2  CL 96*  < > 98* 104 101  --   CO2 25  < > 28 22 23   --   GLUCOSE 84  < > 94 76 63*  --   BUN 14  < > 21* 15 14 21   CREATININE 1.28*  < > 1.52* 1.25* 1.21 1.1  CALCIUM 8.4*  < > 9.0 7.9* 8.2*  --   MG 1.8  --   --   --   --   --   PHOS 2.8  --   --   --   --   --   < > = values in this interval not displayed. Liver Function Tests:  Recent Labs  10/21/15 0612 10/22/15 0515 11/27/15 0027  AST 16 15 27   ALT 11* 12* 22  ALKPHOS 40 43 49  BILITOT 0.6 1.2 0.9  PROT 5.0* 5.6* 6.4*  ALBUMIN 2.5* 2.7* 3.7    Recent Labs  07/24/15 2043  LIPASE 43   No results for input(s): AMMONIA in the last 8760 hours. CBC:  Recent Labs  10/20/15 2120 10/21/15 0612 10/22/15 0515 10/23/15 0539 11/27/15 0355 11/28/15 0406  WBC 10.1 7.9 8.9 10.2 11.6* 8.5  NEUTROABS 5.8 4.9 6.7  --   --   --   HGB 10.3* 10.6* 11.9* 10.7* 10.4*  12.0*  HCT 32.7* 33.4* 36.3* 33.9* 32.2* 36.0*  MCV 96.2 96.3 96.0 96.9 93.6 93.0  PLT 249 255 258 289 195 190   Cardiac Enzymes: No results for input(s): CKTOTAL, CKMB, CKMBINDEX, TROPONINI in the last 8760 hours. BNP: Invalid input(s): POCBNP No results found for: HGBA1C Lab Results  Component Value Date   TSH 0.031* 10/21/2015   No results found for: VITAMINB12 No results found for: FOLATE No results found for: IRON, TIBC, FERRITIN  Imaging and Procedures obtained prior to SNF admission: Dg Chest 2 View  11/27/2015  CLINICAL DATA:  82 year old male with history of renal cell carcinoma presenting with fever EXAM: CHEST  2 VIEW COMPARISON:  Chest radiograph dated 10/20/2015 FINDINGS: Two views of chest demonstrate emphysematous changes of the lungs with bibasilar atelectatic changes. There is no focal consolidation, pleural effusion, or pneumothorax. There is stable mild cardiomegaly. There is osteopenia with degenerative  changes of the spine. Right shoulder arthroplasty. No acute fracture IMPRESSION: No active cardiopulmonary disease. Electronically Signed   By: Anner Crete M.D.   On: 11/27/2015 00:00    Assessment/Plan 1. Influenza type B bronchitis -still dyspneic, weak, fatigued and coughing -cont nebs as needed, oxygen if sats <90, encourage IS use and ambulation with PT  2. AKI (acute kidney injury) ([place]) -resolved -monitor intake and hydration as high risk for recurrence especially if he goes home quickly  3. CKD (chronic kidney disease) stage 3, GFR 30-59 ml/min -AKI on CKD resolved -encourage hydration here to prevent recurrence  4. 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 Weakness -cont PT, OT, may need some personal care assistance or at least someone to do some errands a few hours a day to keep an eye on him when he does go home (has h/o poor intake on his own)  Comptroller Communication: discussed with SNF staff  Labs/tests ordered:  F/u cbc, bmp  Andriy Sherk L. Creg Gilmer, D.O. [facility] [address] [place], [place] [address] Cell Phone (Mon-Fri 8am-5pm):  [phone] On Call:  [phone] & follow prompts after 5pm & weekends Office Phone:  [phone] Office Fax:  [phone]

## 2015-12-03 DIAGNOSIS — J101 Influenza due to other identified influenza virus with other respiratory manifestations: Secondary | ICD-10-CM | POA: Diagnosis not present

## 2015-12-03 DIAGNOSIS — R509 Fever, unspecified: Secondary | ICD-10-CM | POA: Diagnosis not present

## 2015-12-03 DIAGNOSIS — R2689 Other abnormalities of gait and mobility: Secondary | ICD-10-CM | POA: Diagnosis not present

## 2015-12-03 DIAGNOSIS — M6281 Muscle weakness (generalized): Secondary | ICD-10-CM | POA: Diagnosis not present

## 2015-12-03 DIAGNOSIS — A419 Sepsis, unspecified organism: Secondary | ICD-10-CM | POA: Diagnosis not present

## 2015-12-03 DIAGNOSIS — R278 Other lack of coordination: Secondary | ICD-10-CM | POA: Diagnosis not present

## 2015-12-04 DIAGNOSIS — J101 Influenza due to other identified influenza virus with other respiratory manifestations: Secondary | ICD-10-CM | POA: Diagnosis not present

## 2015-12-04 DIAGNOSIS — A419 Sepsis, unspecified organism: Secondary | ICD-10-CM | POA: Diagnosis not present

## 2015-12-04 DIAGNOSIS — M6281 Muscle weakness (generalized): Secondary | ICD-10-CM | POA: Diagnosis not present

## 2015-12-04 DIAGNOSIS — R2689 Other abnormalities of gait and mobility: Secondary | ICD-10-CM | POA: Diagnosis not present

## 2015-12-04 DIAGNOSIS — R278 Other lack of coordination: Secondary | ICD-10-CM | POA: Diagnosis not present

## 2015-12-07 ENCOUNTER — Non-Acute Institutional Stay (SKILLED_NURSING_FACILITY): Payer: PPO | Admitting: Adult Health

## 2015-12-07 ENCOUNTER — Encounter: Payer: Self-pay | Admitting: Adult Health

## 2015-12-07 DIAGNOSIS — J101 Influenza due to other identified influenza virus with other respiratory manifestations: Secondary | ICD-10-CM | POA: Diagnosis not present

## 2015-12-07 DIAGNOSIS — R278 Other lack of coordination: Secondary | ICD-10-CM | POA: Diagnosis not present

## 2015-12-07 DIAGNOSIS — L989 Disorder of the skin and subcutaneous tissue, unspecified: Secondary | ICD-10-CM

## 2015-12-07 DIAGNOSIS — R531 Weakness: Secondary | ICD-10-CM | POA: Diagnosis not present

## 2015-12-07 DIAGNOSIS — Z1389 Encounter for screening for other disorder: Secondary | ICD-10-CM | POA: Diagnosis not present

## 2015-12-07 DIAGNOSIS — M6281 Muscle weakness (generalized): Secondary | ICD-10-CM | POA: Diagnosis not present

## 2015-12-07 DIAGNOSIS — A419 Sepsis, unspecified organism: Secondary | ICD-10-CM | POA: Diagnosis not present

## 2015-12-07 DIAGNOSIS — R2689 Other abnormalities of gait and mobility: Secondary | ICD-10-CM | POA: Diagnosis not present

## 2015-12-07 DIAGNOSIS — Z1331 Encounter for screening for depression: Secondary | ICD-10-CM

## 2015-12-07 NOTE — Progress Notes (Signed)
Patient ID: [name] [name]., male   DOB: Oct 17, 1922, 82 y.o.   MRN: [id number]  Location:  [place] of Service:  SNF (31) Provider:   Cindi Carbon, [place] [phone]  Patient Care Team: Lorne Skeens, MD as PCP - General (Endocrinology) [facility]  Extended Emergency Contact Information Primary Emergency Contact: Larocque,[name] Address: [address]          [place], [place] [address] [name] of [place] Phone: [phone] Mobile Phone: [phone] Relation: Son Secondary Emergency Contact: Ashbaugh,[name] Address: [address]          [place], [place] [address] [place] Phone: [phone] Relation: Son  Code Status:  DNR Goals of care: Advanced Directive information Advanced Directives 11/26/2015  Does patient have an advance directive? No  Type of Advance Directive -  Copy of advanced directive(s) in chart? -  Would patient like information on creating an advanced directive? No - patient declined information     Chief Complaint  Patient presents with  . Acute Visit    bump on head, ?depression    HPI:  Pt is a 82 y.o. male seen today for an acute visit for a lump noted to the back of his head. He reports that he has always had this area but that it seemed different to the staff at [facility]. He has not had any falls during his stay but did apparently pass out at home prior to his hospital admission in March. He was admitted here on 4/4 for rehab after a hospital stay due to influenza. He is now eating and drinking well and his BUN/Cr today returned at 21.4/1.26 which was improved. The staff was also concerned that he was depressed due to his recent multiple illnesses.  He denies any depression, change in appetite, loss of sleep, anhedonia, SI, etc.     Past Medical History  Diagnosis Date  . Hypertension   . Arthritis     Status post left total  replacement 8 2011  . Pituitary mass ([place]) 2000    S/p transphenoidal excision 05/1999 ([facility])  . 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 Hypothyroidism (acquired) 2000  . Anemia, B12 deficiency 2000  . Hyperlipidemia 2003  . Vitamin D deficiency 2009  . History of renal cell carcinoma 1994    Status post right nephrectomy  . BPH (benign prostatic hyperplasia) 2013  . Glaucoma 2007    Status post left trabeculectomy 2007  . History of colon polyps     Colonoscopy 2001  . Allergic rhinitis     Prior allergy shots 20 years  . Colitis 2014  . Anemia   . Cancer ([place])     renal cell ca and skin cancer   . Nocturia   . History of kidney stones   . Difficult intubation 1994    surgery had to be  stopped due to injury to "throat"  . Difficult intubation 1994    no trouble since.  Required nasotracheal intubation  '02 [facility] / ANESTHESIA RECORD FROM 2013 AND 2016 IN EPIC   Past Surgical History  Procedure Laterality Date  . Tonsillectomy    . Eye surgery  over last 6 yrs.      trabeculectomy...   . Eye surgery   cat ext ou  . Brain surgery  2000    pituatary gland removed .  Marland Kitchen  Joint replacement  2011    knee left  . Cholecystectomy  1984  . Reverse shoulder arthroplasty  12/15/2011    Procedure: REVERSE SHOULDER ARTHROPLASTY;  Surgeon: Marin Shutter, MD;  Location: [place];  Service: Orthopedics;  Laterality: Right;  right total reverse shoulder  . Nephrectomy Right 1994    Renal cell  . Transsphenoidal excision pituitary tumor  05/1999    A.Tommi Rumps, M.D.([facility])  . Ectropion surgery Bilateral 2006  . Mohs procedure       for skin cancer on nose   . Knee arthroscopy Right 02/06/2015    Procedure: RIGHT ARTHROSCOPY KNEE WITH LATERAL MENSICAL  DEBRIDEMENT;  Surgeon: Gaynelle Arabian, MD;  [facility];  Service: Orthopedics;  Laterality: Right;  . Total knee arthroplasty Right 06/29/2015    Procedure: TOTAL KNEE ARTHROPLASTY;   Surgeon: Gaynelle Arabian, MD;  [facility];  Service: Orthopedics;  Laterality: Right;    Allergies  Allergen Reactions  . Percocet [Oxycodone-Acetaminophen] Other (See Comments)    Just doesn't like it  . Diovan [Valsartan] Rash      Medication List       This list is accurate as of: 12/07/15  3:42 PM.  Always use your most recent med list.               acetaminophen 325 MG tablet  Commonly known as:  TYLENOL  Take 2 tablets (650 mg total) by mouth every 6 (six) hours as needed for mild pain or fever.     co-enzyme Q-10 30 MG capsule  Take 30 mg by mouth daily.     finasteride 5 MG tablet  Commonly known as:  PROSCAR  Take 5 mg by mouth every morning.     levothyroxine 88 MCG tablet  Commonly known as:  SYNTHROID, LEVOTHROID  Take 88 mcg by mouth daily.     MULTIVITAMIN & MINERAL PO  Take 1 tablet by mouth daily.     oseltamivir 30 MG capsule  Commonly known as:  TAMIFLU  Take 1 capsule (30 mg total) by mouth 2 (two) times daily.     pravastatin 80 MG tablet  Commonly known as:  PRAVACHOL  Take 80 mg by mouth every evening.     predniSONE 2.5 MG tablet  Commonly known as:  DELTASONE  Take 2.5-5 mg by mouth 2 (two) times daily with a meal. Take 2 tablets (5 mg) in the am and Take 1 tablet (2.5 mg) in the evening.     testosterone cypionate 100 MG/ML injection  Commonly known as:  DEPO-TESTOSTERONE  Inject 0.66ml intramuscularly once weekly     vitamin B-12 1000 MCG tablet  Commonly known as:  CYANOCOBALAMIN  Take 1,000 mcg by mouth daily.     VITAMIN D (ERGOCALCIFEROL) PO  Take 2,000 Units by mouth daily.        Review of Systems  Constitutional: Positive for activity change. Negative for fever, chills, diaphoresis, appetite change, fatigue and unexpected weight change.  HENT: Negative for congestion.   Respiratory: Negative for cough, shortness of breath and wheezing.   Cardiovascular: Negative for chest pain, palpitations and leg swelling.    Genitourinary: Negative for dysuria.  Neurological: Negative for dizziness and facial asymmetry.  Psychiatric/Behavioral: Negative for suicidal ideas, behavioral problems, confusion, sleep disturbance, dysphoric mood, decreased concentration and agitation.    Immunization History  Administered Date(s) Administered  . Pneumococcal Conjugate-13 06/04/2015   Pertinent  Health Maintenance Due  Topic Date Due  . INFLUENZA VACCINE  03/29/2016  . PNA vac Low Risk Adult (2 of 2 - PPSV23) 06/03/2016   Fall Risk  12/07/2015  Falls in the past year? Yes  Number falls in past yr: 2 or more  Injury with Fall? Yes  Risk Factor Category  High Fall Risk  Risk for fall due to : History of fall(s)   Functional Status Survey:    Filed Vitals:   12/07/15 1539  Weight: 173 lb (78.472 kg)   Body mass index is 24.14 kg/(m^2). Physical Exam  Constitutional: He is oriented to person, place, and time. No distress.  HENT:  Posterior scalp with macular light brown lesion, uniform in color, no drainage or tenderness  Eyes:  Pupils are irregular and slow to react, has previous hx of glaucoma surgery  Neck: Normal range of motion. Neck supple. No JVD present. No thyromegaly present.  Cardiovascular: Normal rate and regular rhythm.   Pulmonary/Chest: Effort normal and breath sounds normal.  Abdominal: Soft. Bowel sounds are normal.  Neurological: He is alert and oriented to person, place, and time. No cranial nerve deficit.  Skin: Skin is warm and dry. He is not diaphoretic.  Psychiatric: He has a normal mood and affect.    Labs reviewed:  Recent Labs  10/20/15 2120  11/27/15 0027 11/28/15 0406 11/29/15 0350  NA 129*  < > 135 135 135  K 3.7  < > 4.2 4.4 3.8  CL 96*  < > 98* 104 101  CO2 25  < > 28 22 23   GLUCOSE 84  < > 94 76 63*  BUN 14  < > 21* 15 14  CREATININE 1.28*  < > 1.52* 1.25* 1.21  CALCIUM 8.4*  < > 9.0 7.9* 8.2*  MG 1.8  --   --   --   --   PHOS 2.8  --   --   --   --   < >  = values in this interval not displayed.  Recent Labs  10/21/15 0612 10/22/15 0515 11/27/15 0027  AST 16 15 27   ALT 11* 12* 22  ALKPHOS 40 43 49  BILITOT 0.6 1.2 0.9  PROT 5.0* 5.6* 6.4*  ALBUMIN 2.5* 2.7* 3.7    Recent Labs  10/20/15 2120 10/21/15 0612 10/22/15 0515 10/23/15 0539 11/27/15 0355 11/28/15 0406  WBC 10.1 7.9 8.9 10.2 11.6* 8.5  NEUTROABS 5.8 4.9 6.7  --   --   --   HGB 10.3* 10.6* 11.9* 10.7* 10.4* 12.0*  HCT 32.7* 33.4* 36.3* 33.9* 32.2* 36.0*  MCV 96.2 96.3 96.0 96.9 93.6 93.0  PLT 249 255 258 289 195 190   Lab Results  Component Value Date   TSH 0.031* 10/21/2015   No results found for: HGBA1C No results found for: CHOL, HDL, LDLCALC, LDLDIRECT, TRIG, CHOLHDL  Significant Diagnostic Results in last 30 days:  Dg Chest 2 View  11/27/2015  CLINICAL DATA:  82 year old male with history of renal cell carcinoma presenting with fever EXAM: CHEST  2 VIEW COMPARISON:  Chest radiograph dated 10/20/2015 FINDINGS: Two views of chest demonstrate emphysematous changes of the lungs with bibasilar atelectatic changes. There is no focal consolidation, pleural effusion, or pneumothorax. There is stable mild cardiomegaly. There is osteopenia with degenerative changes of the spine. Right shoulder arthroplasty. No acute fracture IMPRESSION: No active cardiopulmonary disease. Electronically Signed   By: Anner Crete M.D.   On: 11/27/2015 00:00   Dg Abd 1 View  11/29/2015  CLINICAL DATA:  Nausea.  History of  renal cell carcinoma. EXAM: ABDOMEN - 1 VIEW COMPARISON:  07/24/2015 FINDINGS: The bowel gas pattern is unremarkable. Dilated bowel loops are identified. Surgical clips overlying the right abdomen noted. No suspicious calcifications are identified. Mild -moderate degenerative changes in the lumbar spine and hips noted. IMPRESSION: No acute abnormality.  Unremarkable bowel gas pattern. Electronically Signed   By: Margarette Canada M.D.   On: 11/29/2015 09:25     Assessment/Plan 1. Skin lesion -noted to posterior scalp with no suspicious borders, color etc -resident will follow up with Dr. Ubaldo Glassing already scheduled -the "raised area" to his posterior scalp appears to have been present all his life, no neuro changes  2. Influenza B -has completed Tamiflu -feeling stronger, eating and drinking well -no fever, BMP improved  3. Weakness Improving with PT May want to return to [place] in the next few days  4. Screening for depression -did not screen pos for depression with PHQ 9 -most likely is just exhibiting frustration with being sick frequently and the loss of his wife last year    Family/ staff Communication: discussed with resident/staff  Labs/tests ordered:  NA

## 2015-12-08 ENCOUNTER — Encounter: Payer: Self-pay | Admitting: Internal Medicine

## 2015-12-08 ENCOUNTER — Non-Acute Institutional Stay (SKILLED_NURSING_FACILITY): Payer: PPO | Admitting: Internal Medicine

## 2015-12-08 DIAGNOSIS — N400 Enlarged prostate without lower urinary tract symptoms: Secondary | ICD-10-CM

## 2015-12-08 DIAGNOSIS — A419 Sepsis, unspecified organism: Secondary | ICD-10-CM | POA: Diagnosis not present

## 2015-12-08 DIAGNOSIS — R278 Other lack of coordination: Secondary | ICD-10-CM | POA: Diagnosis not present

## 2015-12-08 DIAGNOSIS — D519 Vitamin B12 deficiency anemia, unspecified: Secondary | ICD-10-CM | POA: Diagnosis not present

## 2015-12-08 DIAGNOSIS — R531 Weakness: Secondary | ICD-10-CM

## 2015-12-08 DIAGNOSIS — M6281 Muscle weakness (generalized): Secondary | ICD-10-CM | POA: Diagnosis not present

## 2015-12-08 DIAGNOSIS — J101 Influenza due to other identified influenza virus with other respiratory manifestations: Secondary | ICD-10-CM

## 2015-12-08 DIAGNOSIS — E236 Other disorders of pituitary gland: Secondary | ICD-10-CM

## 2015-12-08 DIAGNOSIS — E893 Postprocedural hypopituitarism: Secondary | ICD-10-CM

## 2015-12-08 DIAGNOSIS — R2689 Other abnormalities of gait and mobility: Secondary | ICD-10-CM | POA: Diagnosis not present

## 2015-12-08 NOTE — Progress Notes (Signed)
Patient ID: [name] Marke., male   DOB: 1923-04-18, 82 y.o.   MRN: [id number]  Location:  [place] Room Number: 120A Place of Service:  SNF (31)  Provider: Daja Shuping L. Mariea [name], D.O., C.M.D.  PCP: [name] Patricia, MD Patient Care Team: [name] Skeens, MD as PCP - General (Endocrinology) [facility]  Extended Emergency Contact Information Primary Emergency Contact: [name],[name] Address: [address]          [place], [place] [address] [name] of [place] Phone: [phone] Mobile Phone: [phone] Relation: Son Secondary Emergency Contact: [name],[name] Address: [address]          [place], [place] [address] [place] of [place] Phone: [phone] Relation: Son  Code Status: DNR Goals of care:  Advanced Directive information Advanced Directives 12/08/2015  Does patient have an advance directive? Yes  Type of Advance Directive Out of facility DNR (pink MOST or yellow form);Healthcare Power of Attorney  Copy of advanced directive(s) in chart? Yes  Would patient like information on creating an advanced directive? -  Pre-existing out of facility DNR order (yellow form or pink MOST form) Pink MOST form placed in chart (order not valid for inpatient use);Yellow form placed in chart (order not valid for inpatient use)     Allergies  Allergen Reactions  . Percocet [Oxycodone-Acetaminophen] Other (See Comments)    Just doesn't like it  . Diovan [Valsartan] Rash    Chief Complaint  Patient presents with  . Discharge Note    discharge home to [place]    HPI:  82 y.o. male  With h/o pituitary adenoma s/p resection on hormone repletion, recent traumatic accident when he fell at [place], celllulitis complicating those wounds, and most recently a hospital stay with high fever, cough.  Here in SNF for rehab s/p hospitalization with influenza B.  He has been making great strides with rehab.  As of  today, he is not quite ready to go back to his apt, but hopes to by the weekend.    He is continuing to cough occasionally, but it's no longer productive.  His breathing and energy levels are improving.  Feels a lot better.  He walked with therapy this am and used some stairs.    He says he feels comfortable giving himself a shower.    Past Medical History  Diagnosis Date  . Hypertension   . Arthritis     Status post left total replacement 8 2011  . Pituitary mass ([place]) 2000    S/p transphenoidal excision 05/1999 ([facility])  . 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 Hypothyroidism (acquired) 2000  . Anemia, B12 deficiency 2000  . Hyperlipidemia 2003  . Vitamin D deficiency 2009  . History of renal cell carcinoma 1994    Status post right nephrectomy  . BPH (benign prostatic hyperplasia) 2013  . Glaucoma 2007    Status post left trabeculectomy 2007  . History of colon polyps     Colonoscopy 2001  . Allergic rhinitis     Prior allergy shots 20 years  . Colitis 2014  . Anemia   . Cancer ([place])     renal cell ca and skin cancer   . Nocturia   . History of kidney stones   . Difficult intubation 1994    surgery had to be  stopped due to injury to "throat"  . Difficult intubation 1994    no trouble  since.  Required nasotracheal intubation  '02 [facility] / ANESTHESIA RECORD FROM 2013 AND 2016 IN EPIC    Past Surgical History  Procedure Laterality Date  . Tonsillectomy    . Eye surgery  over last 6 yrs.      trabeculectomy...   . Eye surgery   cat ext ou  . Brain surgery  2000    pituatary gland removed .  Marland Kitchen Joint replacement  2011    knee left  . Cholecystectomy  1984  . Reverse shoulder arthroplasty  12/15/2011    Procedure: REVERSE SHOULDER ARTHROPLASTY;  Surgeon: [name] Shutter, MD;  Location: [place];  Service: Orthopedics;  Laterality: Right;  right total reverse shoulder  . Nephrectomy Right 1994    Renal cell  .  Transsphenoidal excision pituitary tumor  05/1999    [name] [name], M.D.([facility])  . Ectropion surgery Bilateral 2006  . Mohs procedure       for skin cancer on nose   . Knee arthroscopy Right 02/06/2015    Procedure: RIGHT ARTHROSCOPY KNEE WITH LATERAL MENSICAL  DEBRIDEMENT;  Surgeon: [name] Arabian, MD;  [facility];  Service: Orthopedics;  Laterality: Right;  . Total knee arthroplasty Right 06/29/2015    Procedure: TOTAL KNEE ARTHROPLASTY;  Surgeon: [name] Arabian, MD;  [facility];  Service: Orthopedics;  Laterality: Right;      reports that he quit smoking about 27 years ago. His smoking use included Cigarettes and Pipe. He has never used smokeless tobacco. He reports that he drinks alcohol. He reports that he does not use illicit drugs. Social History   Social History  . Marital Status: Widowed    Spouse Name: N/A  . Number of Children: N/A  . Years of Education: N/A   Occupational History  . Not on file.   Social History Main Topics  . Smoking status: Former Smoker    Types: Cigarettes, Pipe    Quit date: 09/26/1988  . Smokeless tobacco: Never Used  . Alcohol Use: Yes     Comment: 1.5-2 ounces of vodka nightly   . Drug Use: No  . Sexual Activity: Not on file   Other Topics Concern  . Not on file   Social History Narrative   Patient is Married Software engineer). Retired Engineer, mining) Administrator, sports. Lives in apartment, Independent Living section at [facility] since 2000.  Spouse lives in skilled nursing section in same community   Smoking 1994 , moderate alcohol use: 2 drinks a night. Exercises regularly with walking and exercise classes. Continues to drive   Patient has Advanced planning documents: Living Will               Functional Status Survey: Is the patient deaf or have difficulty hearing?: No Does the patient have difficulty seeing, even when wearing glasses/contacts?: No Does the patient have difficulty concentrating,  remembering, or making decisions?: No Does the patient have difficulty walking or climbing stairs?: Yes Does the patient have difficulty dressing or bathing?: Yes Does the patient have difficulty doing errands alone such as visiting a doctor's office or shopping?: Yes  Allergies  Allergen Reactions  . Percocet [Oxycodone-Acetaminophen] Other (See Comments)    Just doesn't like it  . Diovan [Valsartan] Rash    Pertinent  Health Maintenance Due  Topic Date Due  . INFLUENZA VACCINE  03/29/2016  . PNA vac Low Risk Adult (2 of 2 - PPSV23) 06/03/2016    Medications:   Medication List  This list is accurate as of: 12/08/15 12:25 PM.  Always use your most recent med list.               acetaminophen 325 MG tablet  Commonly known as:  TYLENOL  Take 2 tablets (650 mg total) by mouth every 6 (six) hours as needed for mild pain or fever.     co-enzyme Q-10 30 MG capsule  Take 30 mg by mouth daily.     finasteride 5 MG tablet  Commonly known as:  PROSCAR  Take 5 mg by mouth every morning.     levothyroxine 88 MCG tablet  Commonly known as:  SYNTHROID, LEVOTHROID  Take 88 mcg by mouth daily.     MULTIVITAMIN & MINERAL PO  Take 1 tablet by mouth daily.     pravastatin 80 MG tablet  Commonly known as:  PRAVACHOL  Take 80 mg by mouth every evening.     predniSONE 2.5 MG tablet  Commonly known as:  DELTASONE  Take 2.5-5 mg by mouth 2 (two) times daily with a meal. Take 2 tablets (5 mg) in the am and Take 1 tablet (2.5 mg) in the evening.     testosterone cypionate 100 MG/ML injection  Commonly known as:  DEPO-TESTOSTERONE  Inject 0.21ml intramuscularly once weekly     vitamin B-12 1000 MCG tablet  Commonly known as:  CYANOCOBALAMIN  Take 1,000 mcg by mouth daily.     VITAMIN D (ERGOCALCIFEROL) PO  Take 2,000 Units by mouth daily.        Review of Systems  Constitutional: Positive for activity change, appetite change, fatigue and unexpected weight change.  Negative for fever.       All improving  HENT: Negative for congestion, sinus pressure and sore throat.   Eyes:       Glasses  Respiratory: Positive for cough. Negative for shortness of breath and wheezing.   Cardiovascular: Negative for chest pain and leg swelling.  Gastrointestinal: Negative for constipation.  Genitourinary: Negative for dysuria.  Skin: Negative for color change.  Neurological: Positive for weakness. Negative for dizziness and light-headedness.  Psychiatric/Behavioral: Negative for confusion.    Filed Vitals:   12/08/15 1148  BP: 129/74  Pulse: 64  Temp: 97.5 F (36.4 C)  TempSrc: Oral  Resp: 18  Height: 5\' 11"  (1.803 m)  Weight: 173 lb (78.472 kg)  SpO2: 99%   Body mass index is 24.14 kg/(m^2). Physical Exam  Constitutional: He is oriented to person, place, and time. He appears well-developed and well-nourished. No distress.  Cardiovascular: Normal rate, regular rhythm, normal heart sounds and intact distal pulses.   Pulmonary/Chest: Effort normal.  Few rhonchi remain  Abdominal: Bowel sounds are normal.  Musculoskeletal: Normal range of motion.  Neurological: He is alert and oriented to person, place, and time.  A little slower to communicate than previously  Psychiatric: He has a normal mood and affect.    Labs reviewed: Basic Metabolic Panel:  Recent Labs  10/20/15 2120  11/27/15 0027 11/28/15 0406 11/29/15 0350  NA 129*  < > 135 135 135  K 3.7  < > 4.2 4.4 3.8  CL 96*  < > 98* 104 101  CO2 25  < > 28 22 23   GLUCOSE 84  < > 94 76 63*  BUN 14  < > 21* 15 14  CREATININE 1.28*  < > 1.52* 1.25* 1.21  CALCIUM 8.4*  < > 9.0 7.9* 8.2*  MG 1.8  --   --   --   --  PHOS 2.8  --   --   --   --   < > = values in this interval not displayed. Liver Function Tests:  Recent Labs  10/21/15 0612 10/22/15 0515 11/27/15 0027  AST 16 15 27   ALT 11* 12* 22  ALKPHOS 40 43 49  BILITOT 0.6 1.2 0.9  PROT 5.0* 5.6* 6.4*  ALBUMIN 2.5* 2.7* 3.7     Recent Labs  07/24/15 2043  LIPASE 43   No results for input(s): AMMONIA in the last 8760 hours. CBC:  Recent Labs  10/20/15 2120 10/21/15 0612 10/22/15 0515 10/23/15 0539 11/27/15 0355 11/28/15 0406  WBC 10.1 7.9 8.9 10.2 11.6* 8.5  NEUTROABS 5.8 4.9 6.7  --   --   --   HGB 10.3* 10.6* 11.9* 10.7* 10.4* 12.0*  HCT 32.7* 33.4* 36.3* 33.9* 32.2* 36.0*  MCV 96.2 96.3 96.0 96.9 93.6 93.0  PLT 249 255 258 289 195 190   Cardiac Enzymes: No results for input(s): CKTOTAL, CKMB, CKMBINDEX, TROPONINI in the last 8760 hours. BNP: Invalid input(s): POCBNP CBG: No results for input(s): GLUCAP in the last 8760 hours.  Procedures and Imaging Studies During Stay: Dg Chest 2 View  11/27/2015  CLINICAL DATA:  82 year old male with history of renal cell carcinoma presenting with fever EXAM: CHEST  2 VIEW COMPARISON:  Chest radiograph dated 10/20/2015 FINDINGS: Two views of chest demonstrate emphysematous changes of the lungs with bibasilar atelectatic changes. There is no focal consolidation, pleural effusion, or pneumothorax. There is stable mild cardiomegaly. There is osteopenia with degenerative changes of the spine. Right shoulder arthroplasty. No acute fracture IMPRESSION: No active cardiopulmonary disease. Electronically Signed   By: Anner Crete M.D.   On: 11/27/2015 00:00   Dg Abd 1 View  11/29/2015  CLINICAL DATA:  Nausea.  History of renal cell carcinoma. EXAM: ABDOMEN - 1 VIEW COMPARISON:  07/24/2015 FINDINGS: The bowel gas pattern is unremarkable. Dilated bowel loops are identified. Surgical clips overlying the right abdomen noted. No suspicious calcifications are identified. Mild -moderate degenerative changes in the lumbar spine and hips noted. IMPRESSION: No acute abnormality.  Unremarkable bowel gas pattern. Electronically Signed   By: Margarette Canada M.D.   On: 11/29/2015 09:25    Assessment/Plan:   1. Influenza B -seems he is recovered from the infection itself, but to  have f/u CXR -has completed his tamiflu and appetite back, energy gradually returning and strength -incentive spirometry 10 breaths 5x per day  2. Weakness -cont PT, feels comfortable with adls to do those on his own -also cont to eat healthy balanced meals to help with his strength  3. 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 Elyse Hsu -remains on his prednisone 5mg  in am, 2.5mg  in pm  4. Anemia, B12 deficiency -cont b12 daily  5. BPH (benign prostatic hyperplasia) -cont home finasteride  Patient is being discharged with the following home health services:  PT if still needed Friday  Patient is being discharged with the following durable medical equipment:  Gilford Rile, scooter  Patient has been advised to f/u with their PCP in 1-2 weeks to bring them up to date on their rehab stay.  Social services at facility was responsible for arranging this appointment.  Pt was provided with a 30 day supply of prescriptions for medications and refills must be obtained from their PCP.  For controlled substances, a more limited supply may be provided adequate until PCP appointment only.  Future labs/tests needed:  CXR

## 2015-12-08 NOTE — Progress Notes (Signed)
Only at this time due to his recent influenza; previously was getting around with his scooter

## 2015-12-09 DIAGNOSIS — A419 Sepsis, unspecified organism: Secondary | ICD-10-CM | POA: Diagnosis not present

## 2015-12-09 DIAGNOSIS — R2689 Other abnormalities of gait and mobility: Secondary | ICD-10-CM | POA: Diagnosis not present

## 2015-12-09 DIAGNOSIS — R278 Other lack of coordination: Secondary | ICD-10-CM | POA: Diagnosis not present

## 2015-12-09 DIAGNOSIS — M6281 Muscle weakness (generalized): Secondary | ICD-10-CM | POA: Diagnosis not present

## 2015-12-09 DIAGNOSIS — J101 Influenza due to other identified influenza virus with other respiratory manifestations: Secondary | ICD-10-CM | POA: Diagnosis not present

## 2015-12-10 DIAGNOSIS — M6281 Muscle weakness (generalized): Secondary | ICD-10-CM | POA: Diagnosis not present

## 2015-12-10 DIAGNOSIS — A419 Sepsis, unspecified organism: Secondary | ICD-10-CM | POA: Diagnosis not present

## 2015-12-10 DIAGNOSIS — R278 Other lack of coordination: Secondary | ICD-10-CM | POA: Diagnosis not present

## 2015-12-10 DIAGNOSIS — J101 Influenza due to other identified influenza virus with other respiratory manifestations: Secondary | ICD-10-CM | POA: Diagnosis not present

## 2015-12-10 DIAGNOSIS — R2689 Other abnormalities of gait and mobility: Secondary | ICD-10-CM | POA: Diagnosis not present

## 2015-12-11 DIAGNOSIS — J101 Influenza due to other identified influenza virus with other respiratory manifestations: Secondary | ICD-10-CM | POA: Diagnosis not present

## 2015-12-11 DIAGNOSIS — M6281 Muscle weakness (generalized): Secondary | ICD-10-CM | POA: Diagnosis not present

## 2015-12-11 DIAGNOSIS — R2689 Other abnormalities of gait and mobility: Secondary | ICD-10-CM | POA: Diagnosis not present

## 2015-12-11 DIAGNOSIS — A419 Sepsis, unspecified organism: Secondary | ICD-10-CM | POA: Diagnosis not present

## 2015-12-11 DIAGNOSIS — R278 Other lack of coordination: Secondary | ICD-10-CM | POA: Diagnosis not present

## 2015-12-15 ENCOUNTER — Encounter: Payer: Self-pay | Admitting: Internal Medicine

## 2015-12-15 ENCOUNTER — Non-Acute Institutional Stay (SKILLED_NURSING_FACILITY): Payer: PPO | Admitting: Internal Medicine

## 2015-12-15 DIAGNOSIS — R509 Fever, unspecified: Secondary | ICD-10-CM

## 2015-12-15 DIAGNOSIS — R278 Other lack of coordination: Secondary | ICD-10-CM | POA: Diagnosis not present

## 2015-12-15 DIAGNOSIS — R5383 Other fatigue: Secondary | ICD-10-CM

## 2015-12-15 DIAGNOSIS — R5381 Other malaise: Secondary | ICD-10-CM

## 2015-12-15 DIAGNOSIS — R531 Weakness: Secondary | ICD-10-CM | POA: Diagnosis not present

## 2015-12-15 DIAGNOSIS — M6281 Muscle weakness (generalized): Secondary | ICD-10-CM | POA: Diagnosis not present

## 2015-12-15 DIAGNOSIS — A419 Sepsis, unspecified organism: Secondary | ICD-10-CM | POA: Diagnosis not present

## 2015-12-15 DIAGNOSIS — J101 Influenza due to other identified influenza virus with other respiratory manifestations: Secondary | ICD-10-CM | POA: Diagnosis not present

## 2015-12-15 DIAGNOSIS — R2689 Other abnormalities of gait and mobility: Secondary | ICD-10-CM | POA: Diagnosis not present

## 2015-12-15 LAB — BASIC METABOLIC PANEL
BUN: 21 mg/dL (ref 4–21)
CREATININE: 1.1 mg/dL (ref 0.6–1.3)
Glucose: 101 mg/dL
POTASSIUM: 4.2 mmol/L (ref 3.4–5.3)
SODIUM: 139 mmol/L (ref 137–147)
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
## 2015-12-16 DIAGNOSIS — R2689 Other abnormalities of gait and mobility: Secondary | ICD-10-CM | POA: Diagnosis not present

## 2015-12-16 DIAGNOSIS — M6281 Muscle weakness (generalized): Secondary | ICD-10-CM | POA: Diagnosis not present

## 2015-12-16 DIAGNOSIS — R5383 Other fatigue: Secondary | ICD-10-CM | POA: Diagnosis not present

## 2015-12-16 DIAGNOSIS — R278 Other lack of coordination: Secondary | ICD-10-CM | POA: Diagnosis not present

## 2015-12-16 DIAGNOSIS — A419 Sepsis, unspecified organism: Secondary | ICD-10-CM | POA: Diagnosis not present

## 2015-12-16 DIAGNOSIS — D649 Anemia, unspecified: Secondary | ICD-10-CM | POA: Diagnosis not present

## 2015-12-16 DIAGNOSIS — J101 Influenza due to other identified influenza virus with other respiratory manifestations: Secondary | ICD-10-CM | POA: Diagnosis not present

## 2015-12-17 ENCOUNTER — Encounter: Payer: Self-pay | Admitting: Internal Medicine

## 2015-12-17 DIAGNOSIS — R278 Other lack of coordination: Secondary | ICD-10-CM | POA: Diagnosis not present

## 2015-12-17 DIAGNOSIS — M6281 Muscle weakness (generalized): Secondary | ICD-10-CM | POA: Diagnosis not present

## 2015-12-17 DIAGNOSIS — R2689 Other abnormalities of gait and mobility: Secondary | ICD-10-CM | POA: Diagnosis not present

## 2015-12-17 DIAGNOSIS — A419 Sepsis, unspecified organism: Secondary | ICD-10-CM | POA: Diagnosis not present

## 2015-12-17 DIAGNOSIS — J101 Influenza due to other identified influenza virus with other respiratory manifestations: Secondary | ICD-10-CM | POA: Diagnosis not present

## 2015-12-18 DIAGNOSIS — R2689 Other abnormalities of gait and mobility: Secondary | ICD-10-CM | POA: Diagnosis not present

## 2015-12-18 DIAGNOSIS — A419 Sepsis, unspecified organism: Secondary | ICD-10-CM | POA: Diagnosis not present

## 2015-12-18 DIAGNOSIS — J101 Influenza due to other identified influenza virus with other respiratory manifestations: Secondary | ICD-10-CM | POA: Diagnosis not present

## 2015-12-18 DIAGNOSIS — M6281 Muscle weakness (generalized): Secondary | ICD-10-CM | POA: Diagnosis not present

## 2015-12-18 DIAGNOSIS — R278 Other lack of coordination: Secondary | ICD-10-CM | POA: Diagnosis not present

## 2015-12-21 DIAGNOSIS — E559 Vitamin D deficiency, unspecified: Secondary | ICD-10-CM | POA: Diagnosis not present

## 2015-12-21 DIAGNOSIS — I10 Essential (primary) hypertension: Secondary | ICD-10-CM | POA: Diagnosis not present

## 2015-12-21 DIAGNOSIS — E230 Hypopituitarism: Secondary | ICD-10-CM | POA: Diagnosis not present

## 2015-12-21 DIAGNOSIS — E7800 Pure hypercholesterolemia, unspecified: Secondary | ICD-10-CM | POA: Diagnosis not present

## 2015-12-21 DIAGNOSIS — D510 Vitamin B12 deficiency anemia due to intrinsic factor deficiency: Secondary | ICD-10-CM | POA: Diagnosis not present

## 2015-12-22 DIAGNOSIS — R2689 Other abnormalities of gait and mobility: Secondary | ICD-10-CM | POA: Diagnosis not present

## 2015-12-22 DIAGNOSIS — R278 Other lack of coordination: Secondary | ICD-10-CM | POA: Diagnosis not present

## 2015-12-22 DIAGNOSIS — J101 Influenza due to other identified influenza virus with other respiratory manifestations: Secondary | ICD-10-CM | POA: Diagnosis not present

## 2015-12-22 DIAGNOSIS — M6281 Muscle weakness (generalized): Secondary | ICD-10-CM | POA: Diagnosis not present

## 2015-12-22 DIAGNOSIS — A419 Sepsis, unspecified organism: Secondary | ICD-10-CM | POA: Diagnosis not present

## 2015-12-23 DIAGNOSIS — R278 Other lack of coordination: Secondary | ICD-10-CM | POA: Diagnosis not present

## 2015-12-23 DIAGNOSIS — A419 Sepsis, unspecified organism: Secondary | ICD-10-CM | POA: Diagnosis not present

## 2015-12-23 DIAGNOSIS — M6281 Muscle weakness (generalized): Secondary | ICD-10-CM | POA: Diagnosis not present

## 2015-12-23 DIAGNOSIS — J101 Influenza due to other identified influenza virus with other respiratory manifestations: Secondary | ICD-10-CM | POA: Diagnosis not present

## 2015-12-23 DIAGNOSIS — R2689 Other abnormalities of gait and mobility: Secondary | ICD-10-CM | POA: Diagnosis not present

## 2015-12-25 DIAGNOSIS — R2689 Other abnormalities of gait and mobility: Secondary | ICD-10-CM | POA: Diagnosis not present

## 2015-12-25 DIAGNOSIS — J101 Influenza due to other identified influenza virus with other respiratory manifestations: Secondary | ICD-10-CM | POA: Diagnosis not present

## 2015-12-25 DIAGNOSIS — A419 Sepsis, unspecified organism: Secondary | ICD-10-CM | POA: Diagnosis not present

## 2015-12-25 DIAGNOSIS — M6281 Muscle weakness (generalized): Secondary | ICD-10-CM | POA: Diagnosis not present

## 2015-12-25 DIAGNOSIS — R278 Other lack of coordination: Secondary | ICD-10-CM | POA: Diagnosis not present

## 2016-01-04 ENCOUNTER — Encounter (HOSPITAL_BASED_OUTPATIENT_CLINIC_OR_DEPARTMENT_OTHER): Payer: PPO | Attending: Internal Medicine

## 2016-01-04 DIAGNOSIS — W19XXXA Unspecified fall, initial encounter: Secondary | ICD-10-CM | POA: Insufficient documentation

## 2016-01-04 DIAGNOSIS — I87332 Chronic venous hypertension (idiopathic) with ulcer and inflammation of left lower extremity: Secondary | ICD-10-CM | POA: Diagnosis not present

## 2016-01-04 DIAGNOSIS — Y92531 Health care provider office as the place of occurrence of the external cause: Secondary | ICD-10-CM | POA: Diagnosis not present

## 2016-01-04 DIAGNOSIS — I87302 Chronic venous hypertension (idiopathic) without complications of left lower extremity: Secondary | ICD-10-CM | POA: Diagnosis not present

## 2016-01-04 DIAGNOSIS — Z872 Personal history of diseases of the skin and subcutaneous tissue: Secondary | ICD-10-CM | POA: Insufficient documentation

## 2016-01-04 DIAGNOSIS — Z09 Encounter for follow-up examination after completed treatment for conditions other than malignant neoplasm: Secondary | ICD-10-CM | POA: Diagnosis not present

## 2016-01-04 DIAGNOSIS — I10 Essential (primary) hypertension: Secondary | ICD-10-CM | POA: Insufficient documentation

## 2016-01-06 DIAGNOSIS — Z85828 Personal history of other malignant neoplasm of skin: Secondary | ICD-10-CM | POA: Diagnosis not present

## 2016-01-06 DIAGNOSIS — L570 Actinic keratosis: Secondary | ICD-10-CM | POA: Diagnosis not present

## 2016-01-06 DIAGNOSIS — D692 Other nonthrombocytopenic purpura: Secondary | ICD-10-CM | POA: Diagnosis not present

## 2016-01-08 DIAGNOSIS — A419 Sepsis, unspecified organism: Secondary | ICD-10-CM | POA: Diagnosis not present

## 2016-01-08 DIAGNOSIS — R2689 Other abnormalities of gait and mobility: Secondary | ICD-10-CM | POA: Diagnosis not present

## 2016-01-08 DIAGNOSIS — M6281 Muscle weakness (generalized): Secondary | ICD-10-CM | POA: Diagnosis not present

## 2016-01-08 DIAGNOSIS — J101 Influenza due to other identified influenza virus with other respiratory manifestations: Secondary | ICD-10-CM | POA: Diagnosis not present

## 2016-01-08 DIAGNOSIS — R278 Other lack of coordination: Secondary | ICD-10-CM | POA: Diagnosis not present

## 2016-01-23 ENCOUNTER — Encounter: Payer: Self-pay | Admitting: Internal Medicine

## 2016-01-30 DIAGNOSIS — B20 Human immunodeficiency virus [HIV] disease: Secondary | ICD-10-CM | POA: Diagnosis not present

## 2016-01-30 DIAGNOSIS — R5383 Other fatigue: Secondary | ICD-10-CM | POA: Diagnosis not present

## 2016-02-01 DIAGNOSIS — E230 Hypopituitarism: Secondary | ICD-10-CM | POA: Diagnosis not present

## 2016-02-01 DIAGNOSIS — E7800 Pure hypercholesterolemia, unspecified: Secondary | ICD-10-CM | POA: Diagnosis not present

## 2016-02-05 DIAGNOSIS — E559 Vitamin D deficiency, unspecified: Secondary | ICD-10-CM | POA: Diagnosis not present

## 2016-02-05 DIAGNOSIS — D510 Vitamin B12 deficiency anemia due to intrinsic factor deficiency: Secondary | ICD-10-CM | POA: Diagnosis not present

## 2016-02-05 DIAGNOSIS — E230 Hypopituitarism: Secondary | ICD-10-CM | POA: Diagnosis not present

## 2016-02-05 DIAGNOSIS — E7800 Pure hypercholesterolemia, unspecified: Secondary | ICD-10-CM | POA: Diagnosis not present

## 2016-02-05 DIAGNOSIS — I10 Essential (primary) hypertension: Secondary | ICD-10-CM | POA: Diagnosis not present

## 2016-02-23 DIAGNOSIS — L84 Corns and callosities: Secondary | ICD-10-CM | POA: Diagnosis not present

## 2016-02-23 DIAGNOSIS — M79671 Pain in right foot: Secondary | ICD-10-CM | POA: Diagnosis not present

## 2016-02-23 DIAGNOSIS — M79672 Pain in left foot: Secondary | ICD-10-CM | POA: Diagnosis not present

## 2016-02-23 DIAGNOSIS — L89892 Pressure ulcer of other site, stage 2: Secondary | ICD-10-CM | POA: Diagnosis not present

## 2016-02-23 DIAGNOSIS — B351 Tinea unguium: Secondary | ICD-10-CM | POA: Diagnosis not present

## 2016-03-24 DIAGNOSIS — Z961 Presence of intraocular lens: Secondary | ICD-10-CM | POA: Diagnosis not present

## 2016-04-05 ENCOUNTER — Encounter (HOSPITAL_COMMUNITY): Payer: Self-pay | Admitting: Emergency Medicine

## 2016-04-05 ENCOUNTER — Emergency Department (HOSPITAL_COMMUNITY)
Admission: EM | Admit: 2016-04-05 | Discharge: 2016-04-05 | Disposition: A | Discharge status: Home / Self Care | Payer: PPO | Attending: Emergency Medicine | Admitting: Emergency Medicine

## 2016-04-05 ENCOUNTER — Emergency Department (HOSPITAL_COMMUNITY): Payer: PPO

## 2016-04-05 DIAGNOSIS — R1111 Vomiting without nausea: Secondary | ICD-10-CM | POA: Diagnosis not present

## 2016-04-05 DIAGNOSIS — I129 Hypertensive chronic kidney disease with stage 1 through stage 4 chronic kidney disease, or unspecified chronic kidney disease: Secondary | ICD-10-CM | POA: Insufficient documentation

## 2016-04-05 DIAGNOSIS — Z79899 Other long term (current) drug therapy: Secondary | ICD-10-CM | POA: Insufficient documentation

## 2016-04-05 DIAGNOSIS — E860 Dehydration: Secondary | ICD-10-CM | POA: Insufficient documentation

## 2016-04-05 DIAGNOSIS — Z96651 Presence of right artificial knee joint: Secondary | ICD-10-CM | POA: Insufficient documentation

## 2016-04-05 DIAGNOSIS — Z85528 Personal history of other malignant neoplasm of kidney: Secondary | ICD-10-CM | POA: Diagnosis not present

## 2016-04-05 DIAGNOSIS — E039 Hypothyroidism, unspecified: Secondary | ICD-10-CM | POA: Diagnosis not present

## 2016-04-05 DIAGNOSIS — N183 Chronic kidney disease, stage 3 (moderate): Secondary | ICD-10-CM | POA: Insufficient documentation

## 2016-04-05 DIAGNOSIS — K5730 Diverticulosis of large intestine without perforation or abscess without bleeding: Secondary | ICD-10-CM | POA: Diagnosis not present

## 2016-04-05 DIAGNOSIS — R110 Nausea: Secondary | ICD-10-CM

## 2016-04-05 DIAGNOSIS — Z87891 Personal history of nicotine dependence: Secondary | ICD-10-CM | POA: Insufficient documentation

## 2016-04-05 DIAGNOSIS — R112 Nausea with vomiting, unspecified: Secondary | ICD-10-CM | POA: Diagnosis not present

## 2016-04-05 LAB — CBC
HEMATOCRIT: 38.4 % — AB (ref 39.0–52.0)
HEMOGLOBIN: 12.7 g/dL — AB (ref 13.0–17.0)
MCH: 31.6 pg (ref 26.0–34.0)
MCHC: 33.1 g/dL (ref 30.0–36.0)
MCV: 95.5 fL (ref 78.0–100.0)
Platelets: 240 10*3/uL (ref 150–400)
RBC: 4.02 MIL/uL — AB (ref 4.22–5.81)
RDW: 14.9 % (ref 11.5–15.5)
WBC: 13.5 10*3/uL — AB (ref 4.0–10.5)

## 2016-04-05 LAB — COMPREHENSIVE METABOLIC PANEL
ALT: 14 U/L — ABNORMAL LOW (ref 17–63)
AST: 15 U/L (ref 15–41)
Albumin: 3.4 g/dL — ABNORMAL LOW (ref 3.5–5.0)
Alkaline Phosphatase: 48 U/L (ref 38–126)
Anion gap: 5 (ref 5–15)
BUN: 19 mg/dL (ref 6–20)
CHLORIDE: 95 mmol/L — AB (ref 101–111)
CO2: 30 mmol/L (ref 22–32)
Calcium: 8.8 mg/dL — ABNORMAL LOW (ref 8.9–10.3)
Creatinine, Ser: 1.22 mg/dL (ref 0.61–1.24)
GFR, EST AFRICAN AMERICAN: 58 mL/min — AB (ref >60)
GFR, EST NON AFRICAN AMERICAN: 50 mL/min — AB (ref >60)
Glucose, Bld: 94 mg/dL (ref 65–99)
POTASSIUM: 3.5 mmol/L (ref 3.5–5.1)
Sodium: 130 mmol/L — ABNORMAL LOW (ref 135–145)
Total Bilirubin: 1.1 mg/dL (ref 0.3–1.2)
Total Protein: 5.9 g/dL — ABNORMAL LOW (ref 6.5–8.1)

## 2016-04-05 LAB — URINALYSIS, ROUTINE W REFLEX MICROSCOPIC
Bilirubin Urine: NEGATIVE
GLUCOSE, UA: NEGATIVE mg/dL
Hgb urine dipstick: NEGATIVE
Ketones, ur: NEGATIVE mg/dL
LEUKOCYTES UA: NEGATIVE
Nitrite: NEGATIVE
PH: 7.5 (ref 5.0–8.0)
Protein, ur: NEGATIVE mg/dL
SPECIFIC GRAVITY, URINE: 1.014 (ref 1.005–1.030)

## 2016-04-05 LAB — I-STAT CG4 LACTIC ACID, ED: Lactic Acid, Venous: 0.58 mmol/L (ref 0.5–1.9)

## 2016-04-05 LAB — LIPASE, BLOOD: LIPASE: 34 U/L (ref 11–51)

## 2016-04-05 MED ORDER — ONDANSETRON HCL 4 MG PO TABS: 4.0000 mg | ORAL_TABLET | Freq: Four times a day (QID) | ORAL | 0 refills | Status: DC

## 2016-04-05 MED ORDER — ACETAMINOPHEN 500 MG PO TABS
1000.0000 mg | ORAL_TABLET | Freq: Once | ORAL | Status: AC
  Administered 2016-04-05: 1000 mg via ORAL
  Filled 2016-04-05: qty 2

## 2016-04-05 MED ORDER — IOPAMIDOL (ISOVUE-300) INJECTION 61%
100.0000 mL | Freq: Once | INTRAVENOUS | Status: AC | PRN
  Administered 2016-04-05: 100 mL via INTRAVENOUS

## 2016-04-05 MED ORDER — SODIUM CHLORIDE 0.9 % IV BOLUS (SEPSIS)
1000.0000 mL | Freq: Once | INTRAVENOUS | Status: AC
  Administered 2016-04-05: 1000 mL via INTRAVENOUS

## 2016-04-05 NOTE — ED Provider Notes (Signed)
[facility] DEPT Provider Note   CSN: [id number] Arrival date & time: 04/05/16  1712  First Provider Contact:  First MD Initiated Contact with Patient 04/05/16 1736        History   Chief Complaint Chief Complaint  Patient presents with  . Nausea  . Emesis    HPI [name] [name]. is a 82 y.o. male.  Patient presents to the ED with a chief complaint of nausea and vomiting.  He states that the symptoms started yesterday.  He reports associated low-grade fever to 99.6.   He denies any chest pain, SOB, or abdominal pain.  Denies any cough, or dysuria.  There are no modifying factors.  He states that he hasn't been able to keep anything down.  He lives in independent living at [facility].  There are no other associated symptoms.  There are no modifying factors.   The history is provided by the patient. No language interpreter was used.    Past Medical History:  Diagnosis Date  . Allergic rhinitis    Prior allergy shots 20 years  . Anemia   . Anemia, B12 deficiency 2000  . Arthritis    Status post left total replacement 8 2011  . BPH (benign prostatic hyperplasia) 2013  . Cancer ([place])    renal cell ca and skin cancer   . Colitis 2014  . Difficult intubation 1994   surgery had to be  stopped due to injury to "throat"  . Difficult intubation 1994   no trouble since.  Required nasotracheal intubation  '02 [facility] / ANESTHESIA RECORD FROM 2013 AND 2016 IN EPIC  . Glaucoma 2007   Status post left trabeculectomy 2007  . History of colon polyps    Colonoscopy 2001  . History of kidney stones   . History of renal cell carcinoma 1994   Status post right nephrectomy  . Hyperlipidemia 2003  . Hypertension   . 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 Hypothyroidism (acquired) 2000  . Nocturia   . Pituitary mass ([place]) 2000   S/p transphenoidal excision 05/1999 ([facility])  . Vitamin D deficiency 2009    Patient Active Problem  List   Diagnosis Date Noted  . BPH (benign prostatic hyperplasia) 12/08/2015  . Influenza type B bronchitis 11/30/2015  . Fever 11/27/2015  . Hypotension 11/27/2015  . CKD (chronic kidney disease) stage 3, GFR 30-59 ml/min 11/27/2015  . AKI (acute kidney injury) ([place]) 11/27/2015  . Cellulitis of leg, right 10/24/2015  . Sepsis ([place]) 10/21/2015  . Febrile illness   . S/P total knee arthroplasty 07/13/2015  . OA (osteoarthritis) of knee 06/29/2015  . Ectropion 05/06/2015  . Lateral meniscal tear 02/05/2015  . Pneumonia 10/28/2013  . Hyponatremia 10/20/2013  . Volume depletion 10/20/2013  . Abdominal pain 10/20/2013  . Pancreatitis 10/20/2013  . Dehydration 10/20/2013  . Weakness 10/19/2013  . Hypertension   . 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 Hypothyroidism (acquired)   . Anemia, B12 deficiency   . Colitis   . Meibomian gland disease 08/06/2013  . Primary open angle glaucoma 06/19/2013  . Arthritis of shoulder region, right 12/15/2011  . Exposure keratitis 08/17/2011  . Dry eye 07/13/2011    Past Surgical History:  Procedure Laterality Date  . BRAIN SURGERY  2000   pituatary gland removed .  Marland Kitchen CHOLECYSTECTOMY  1984  . Ectropion surgery Bilateral 2006  . EYE SURGERY  over last 6 yrs.  trabeculectomy...   . EYE SURGERY   cat ext ou  . JOINT REPLACEMENT  2011   knee left  . KNEE ARTHROSCOPY Right 02/06/2015   Procedure: RIGHT ARTHROSCOPY KNEE WITH LATERAL MENSICAL  DEBRIDEMENT;  Surgeon: Gaynelle Arabian, MD;  [facility];  Service: Orthopedics;  Laterality: Right;  . Mohs procedure      for skin cancer on nose   . NEPHRECTOMY Right 1994   Renal cell  . REVERSE SHOULDER ARTHROPLASTY  12/15/2011   Procedure: REVERSE SHOULDER ARTHROPLASTY;  Surgeon: Marin Shutter, MD;  Location: [facility];  Service: Orthopedics;  Laterality: Right;  right total reverse shoulder  . TONSILLECTOMY    . TOTAL KNEE ARTHROPLASTY Right 06/29/2015   Procedure: TOTAL KNEE  ARTHROPLASTY;  Surgeon: Gaynelle Arabian, MD;  [facility];  Service: Orthopedics;  Laterality: Right;  . Transsphenoidal excision pituitary tumor  05/1999   A.Tommi Rumps, M.D.([facility])       Home Medications    Prior to Admission medications   Medication Sig Start Date End Date Taking? Authorizing Provider  acetaminophen (TYLENOL) 325 MG tablet Take 2 tablets (650 mg total) by mouth every 6 (six) hours as needed for mild pain or fever. 10/24/15  Yes Bonnielee Haff, MD  Coenzyme Q10 300 MG CAPS Take 1 capsule by mouth daily.   Yes Historical Provider, MD  finasteride (PROSCAR) 5 MG tablet Take 5 mg by mouth every morning.    Yes Historical Provider, MD  levothyroxine (SYNTHROID, LEVOTHROID) 88 MCG tablet Take 88 mcg by mouth daily.   Yes Historical Provider, MD  Multiple Vitamins-Minerals (MULTIVITAMIN & MINERAL PO) Take 1 tablet by mouth daily.   Yes Historical Provider, MD  pravastatin (PRAVACHOL) 80 MG tablet Take 80 mg by mouth every evening.    Yes Historical Provider, MD  predniSONE (DELTASONE) 2.5 MG tablet Take 2 tablets (5 mg) in the am and Take 1 tablet (2.5 mg) in the evening.   Yes Historical Provider, MD  vitamin B-12 (CYANOCOBALAMIN) 1000 MCG tablet Take 1,000 mcg by mouth daily.   Yes Historical Provider, MD  VITAMIN D, ERGOCALCIFEROL, PO Take 2,000 Units by mouth daily.    Yes Historical Provider, MD  testosterone cypionate (DEPO-TESTOSTERONE) 100 MG/ML injection Inject 0.51ml intramuscularly once weekly 10/28/15   Gildardo Cranker, DO    Family History Family History  Problem Relation Age of Onset  . Lung cancer Father   . Anesthesia problems Neg Hx   . Hypotension Neg Hx   . Malignant hyperthermia Neg Hx   . Pseudochol deficiency Neg Hx     Social History Social History  Substance Use Topics  . Smoking status: Former Smoker    Types: Cigarettes, Pipe    Quit date: 09/26/1988  . Smokeless tobacco: Never Used  . Alcohol use Yes     Comment: 1.5-2 ounces of vodka  nightly      Allergies   Percocet [oxycodone-acetaminophen]; Robaxin [methocarbamol]; and Diovan [valsartan]   Review of Systems Review of Systems  Gastrointestinal: Positive for nausea and vomiting.  All other systems reviewed and are negative.    Physical Exam Updated Vital Signs BP 147/62 (BP Location: Left Arm)   Pulse 77   Temp 99.6 F (37.6 C) (Oral)   Resp 16   SpO2 100%   Physical Exam  Constitutional: He is oriented to person, place, and time. He appears well-developed and well-nourished.  HENT:  Head: Normocephalic and atraumatic.  Eyes: Conjunctivae and EOM are normal. Pupils are equal, round,  and reactive to light. Right eye exhibits no discharge. Left eye exhibits no discharge. No scleral icterus.  Neck: Normal range of motion. Neck supple. No JVD present.  Cardiovascular: Normal rate, regular rhythm and normal heart sounds.  Exam reveals no gallop and no friction rub.   No murmur heard. Pulmonary/Chest: Effort normal and breath sounds normal. No respiratory distress. He has no wheezes. He has no rales. He exhibits no tenderness.  CTAB  Abdominal: Soft. He exhibits no distension and no mass. There is no tenderness. There is no rebound and no guarding.  No focal abdominal tenderness, no RLQ tenderness or pain at McBurney's point, no RUQ tenderness or Murphy's sign, no left-sided abdominal tenderness, no fluid wave, or signs of peritonitis   Musculoskeletal: Normal range of motion. He exhibits no edema or tenderness.  Neurological: He is alert and oriented to person, place, and time.  Skin: Skin is warm and dry.  Psychiatric: He has a normal mood and affect. His behavior is normal. Judgment and thought content normal.  Nursing note and vitals reviewed.    ED Treatments / Results  Labs (all labs ordered are listed, but only abnormal results are displayed) Labs Reviewed  COMPREHENSIVE METABOLIC PANEL - Abnormal; Notable for the following:       Result Value     Sodium 130 (*)    Chloride 95 (*)    Calcium 8.8 (*)    Total Protein 5.9 (*)    Albumin 3.4 (*)    ALT 14 (*)    GFR calc non Af Amer 50 (*)    GFR calc Af Amer 58 (*)    All other components within normal limits  CBC - Abnormal; Notable for the following:    WBC 13.5 (*)    RBC 4.02 (*)    Hemoglobin 12.7 (*)    HCT 38.4 (*)    All other components within normal limits  LIPASE, BLOOD  URINALYSIS, ROUTINE W REFLEX MICROSCOPIC (NOT AT [facility])  I-STAT CG4 LACTIC ACID, ED    EKG  EKG Interpretation None       Radiology Ct Abdomen Pelvis W Contrast  Result Date: 04/05/2016 CLINICAL DATA:  Patient here from [facility] with complaints of nausea, vomiting and low grade fever since last night. DNR. No Tylenol reports unable to keep anything down. Pt unable to tolerate oral contrast.^140mL ISOVUE-300 IOPAMIDOL (ISOVUE-300) INJECTION 61% EXAM: CT ABDOMEN AND PELVIS WITH CONTRAST TECHNIQUE: Multidetector CT imaging of the abdomen and pelvis was performed using the standard protocol following bolus administration of intravenous contrast. CONTRAST:  159mL ISOVUE-300 IOPAMIDOL (ISOVUE-300) INJECTION 61% COMPARISON:  07/25/2015 FINDINGS: Lower chest: There is minimal atelectasis or scarring at the right lung base. Elevation of the right hemidiaphragm is stable. There is coronary artery calcification. Hepatobiliary: Calcified granuloma identified within the right hepatic lobe. There is focal fatty infiltration adjacent to the falciform ligament. Otherwise the liver is normal in appearance. The gallbladder is surgically absent. Pancreas: Normal in appearance. Spleen: Normal in appearance. Renal/Adrenal: Status post right nephrectomy. The adrenal glands are normal in appearance. Left renal cysts are again noted, stable in appearance. No hydronephrosis or ureteral obstruction. Gastrointestinal tract: The stomach and small bowel loops appear normal. There are scattered colonic diverticula. No acute  diverticulitis. The appendix is well seen and has a normal appearance. Reproductive/Pelvis: The prostate is enlarged and partially calcified. Seminal vesicles are normal in appearance. No free pelvic fluid. Urinary bladder is normal in appearance. Vascular/Lymphatic: Atherosclerotic calcification of the abdominal  aorta. No aneurysm. No retroperitoneal or mesenteric adenopathy. Musculoskeletal/Abdominal wall: Abdominal wall on is unremarkable. Degenerative changes are seen in the spine. No suspicious lytic or blastic lesions are identified. Other: none IMPRESSION: 1.  No evidence for acute  abnormality. 2. Coronary artery disease.  Aortic atherosclerosis. 3. Status post right nephrectomy. 4. Stable appearing left renal cysts. 5. No hydronephrosis. 6. Prostatic enlargement. 7. Colonic diverticulosis without acute diverticulitis. 8. Normal appendix. Electronically Signed   By: Nolon Nations M.D.   On: 04/05/2016 20:04    Procedures Procedures (including critical care time)  Medications Ordered in ED Medications - No data to display   Initial Impression / Assessment and Plan / ED Course  I have reviewed the triage vital signs and the nursing notes.  Pertinent labs & imaging results that were available during my care of the patient were reviewed by me and considered in my medical decision making (see chart for details).  Clinical Course  Value Comment By Time  Chloride: (!) 95 (Reviewed) Montine Circle, PA-C 08/08 2044   Patient with chief complaint of nausea and vomiting.  Symptoms started this morning.  Has had an associated low grade fever.  No focal abdominal tenderness on exam.  Lungs sounds are clear.  In no apparent distress.  Sodium is 130, chloride 95, will give fluids for dehydration.  patient seen by and discussed with Dr. Zenia Resides, who recommends CT given age and elevated temp.    CT is negative for acute process.  Normal lactate.  Case discussed with Dr. Zenia Resides, who agrees with plan  for discharge if patient is tolerating orals.  Will fluid challenge.  8:53 PM Discussed the plan with the patient and the family.  Everyone is in agreement.  Tolerating orals.  Final Clinical Impressions(s) / ED Diagnoses   Final diagnoses:  Nausea  Dehydration  Non-intractable vomiting with nausea, vomiting of unspecified type    New Prescriptions New Prescriptions   ONDANSETRON (ZOFRAN) 4 MG TABLET    Take 1 tablet (4 mg total) by mouth every 6 (six) hours.     Montine Circle, PA-C 04/05/16 2054

## 2016-04-05 NOTE — ED Notes (Signed)
[facility] staff---- Margarita Grizzle, RN was called and was provided report on pt.

## 2016-04-05 NOTE — ED Triage Notes (Signed)
Patient here from [place] with complaints of nausea, vomiting and low grade fever since last night. DNR. No tylenol reports unable to keep anything down.

## 2016-04-05 NOTE — ED Provider Notes (Signed)
Medical screening examination/treatment/procedure(s) were conducted as a shared visit with non-physician practitioner(s) and myself.  I personally evaluated the patient during the encounter.   EKG Interpretation None     82 year old male presents with vomiting and nausea 1 day. Patient notes crampy abdominal discomfort. Abdominal exam diffusely tender. Will order abdominal CT(pending at this time   Lacretia Leigh, MD 04/05/16 1925

## 2016-04-05 NOTE — ED Notes (Signed)
Pt is well-tolerating ginger ale---- no s/s nausea/vomiting observed.

## 2016-04-08 ENCOUNTER — Non-Acute Institutional Stay (SKILLED_NURSING_FACILITY): Payer: PPO | Admitting: Adult Health

## 2016-04-08 DIAGNOSIS — E236 Other disorders of pituitary gland: Secondary | ICD-10-CM | POA: Diagnosis not present

## 2016-04-08 DIAGNOSIS — I10 Essential (primary) hypertension: Secondary | ICD-10-CM

## 2016-04-08 DIAGNOSIS — R1084 Generalized abdominal pain: Secondary | ICD-10-CM | POA: Diagnosis not present

## 2016-04-08 DIAGNOSIS — N183 Chronic kidney disease, stage 3 unspecified: Secondary | ICD-10-CM

## 2016-04-08 DIAGNOSIS — E039 Hypothyroidism, unspecified: Secondary | ICD-10-CM | POA: Diagnosis not present

## 2016-04-08 DIAGNOSIS — E785 Hyperlipidemia, unspecified: Secondary | ICD-10-CM

## 2016-04-08 DIAGNOSIS — R531 Weakness: Secondary | ICD-10-CM

## 2016-04-08 DIAGNOSIS — E860 Dehydration: Secondary | ICD-10-CM | POA: Diagnosis not present

## 2016-04-08 DIAGNOSIS — E893 Postprocedural hypopituitarism: Secondary | ICD-10-CM

## 2016-04-20 ENCOUNTER — Encounter: Payer: Self-pay | Admitting: Adult Health

## 2016-04-20 DIAGNOSIS — L570 Actinic keratosis: Secondary | ICD-10-CM | POA: Diagnosis not present

## 2016-04-20 DIAGNOSIS — E785 Hyperlipidemia, unspecified: Secondary | ICD-10-CM | POA: Insufficient documentation

## 2016-04-20 DIAGNOSIS — Z85828 Personal history of other malignant neoplasm of skin: Secondary | ICD-10-CM | POA: Diagnosis not present

## 2016-04-20 DIAGNOSIS — L718 Other rosacea: Secondary | ICD-10-CM | POA: Diagnosis not present

## 2016-04-20 DIAGNOSIS — D692 Other nonthrombocytopenic purpura: Secondary | ICD-10-CM | POA: Diagnosis not present

## 2016-04-20 NOTE — Progress Notes (Signed)
Patient ID: [name] [name]., male   DOB: Jun 07, 1923, 82 y.o.   MRN: [id number]  Location:   [facility]   Place of Service:  SNF (31)  Provider: Cindi Carbon, ANP [place] [phone]   PCP: [name], MD Patient Care Team: Lorne Skeens, MD as PCP - General (Endocrinology) [facility]  Extended Emergency Contact Information Primary Emergency Contact: Razavi,[name] Address: [address]          [place], [place] [address] [name] of [place] Phone: [phone] Mobile Phone: [phone] Relation: Son Secondary Emergency Contact: Trang,[name] Address: [address]          [place], [place] [address] [place] Phone: [phone] Relation: Son  Code Status: DNR Goals of care:  Advanced Directive information Advanced Directives 12/15/2015  Does patient have an advance directive? Yes  Type of Advance Directive Out of facility DNR (pink MOST or yellow form);[place];Living will  Does patient want to make changes to advanced directive? -  Copy of advanced directive(s) in chart? Yes  Would patient like information on creating an advanced directive? -  Pre-existing out of facility DNR order (yellow form or pink MOST form) Yellow form placed in chart (order not valid for inpatient use);Pink MOST form placed in chart (order not valid for inpatient use)     Allergies  Allergen Reactions  . Percocet [Oxycodone-Acetaminophen] Other (See Comments)    Just doesn't like it  . Robaxin [Methocarbamol]   . Diovan [Valsartan] Rash    Chief Complaint  Patient presents with  . Discharge Note    HPI:  82 y.o. male  In rehab after an ED visit due to abd pain, nausea, and vomiting on 04/05/16.  A CT of the abd was ordered with no evidence of acute abnormality. He lives in independently living and felt weak so he decided to come over to rehab for additional assistance. He is currently  ambulatory with a walker and feels much better with no further pain or GI upset. It was this was most likely related to gastroenteritis. He is eating and drinking well with norma BMs.  Normal VS.  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 He is dependent on prednisone 5 mg qd for this reason. He was felt to mildly dehydrated and given IVF.  He is prone to dehydration and weakness due to the above hx and has a hydration strategy that he uses at home.  He feels well and [place] ready to return [place]   Past Medical History:  Diagnosis Date  . Allergic rhinitis    Prior allergy shots 20 years  . Anemia   . Anemia, B12 deficiency 2000  . Arthritis    Status post left total replacement 8 2011  . BPH (benign prostatic hyperplasia) 2013  . Cancer ([place])    renal cell ca and skin cancer   . Colitis 2014  . Difficult intubation 1994   surgery had to be  stopped due to injury to "throat"  . Difficult intubation 1994   no trouble since.  Required nasotracheal intubation  '02 [facility] / ANESTHESIA RECORD FROM 2013 AND 2016 IN EPIC  . Glaucoma 2007   Status post left trabeculectomy 2007  . History of colon polyps    Colonoscopy 2001  . History of kidney stones   . History of renal cell carcinoma 1994   Status post right nephrectomy  . Hyperlipidemia 2003  .  Hypertension   . 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 Hypothyroidism (acquired) 2000  . Nocturia   . Pituitary mass ([place]) 2000   S/p transphenoidal excision 05/1999 ([facility])  . Vitamin D deficiency 2009    Past Surgical History:  Procedure Laterality Date  . BRAIN SURGERY  2000   pituatary gland removed .  [facility] CHOLECYSTECTOMY  1984  . Ectropion surgery Bilateral 2006  . EYE SURGERY  over last 6 yrs.     trabeculectomy...   . EYE SURGERY   cat ext ou  . JOINT REPLACEMENT  2011   knee left  . KNEE ARTHROSCOPY Right 02/06/2015   Procedure: RIGHT ARTHROSCOPY KNEE WITH LATERAL  MENSICAL  DEBRIDEMENT;  Surgeon: Gaynelle Arabian, MD;  Location: [facility];  Service: Orthopedics;  Laterality: Right;  . Mohs procedure      for skin cancer on nose   . NEPHRECTOMY Right 1994   Renal cell  . REVERSE SHOULDER ARTHROPLASTY  12/15/2011   Procedure: REVERSE SHOULDER ARTHROPLASTY;  Surgeon: Marin Shutter, MD;  Location: [facility];  Service: Orthopedics;  Laterality: Right;  right total reverse shoulder  . TONSILLECTOMY    . TOTAL KNEE ARTHROPLASTY Right 06/29/2015   Procedure: TOTAL KNEE ARTHROPLASTY;  Surgeon: Gaynelle Arabian, MD;  Location: [facility];  Service: Orthopedics;  Laterality: Right;  . Transsphenoidal excision pituitary tumor  05/1999   A.Tommi Rumps, M.D.([facility])      reports that he quit smoking about 27 years ago. His smoking use included Cigarettes and Pipe. He has never used smokeless tobacco. He reports that he drinks alcohol. He reports that he does not use drugs. Social History   Social History  . Marital status: Widowed    Spouse name: N/A  . Number of children: N/A  . Years of education: N/A   Occupational History  . Not on file.   Social History Main Topics  . Smoking status: Former Smoker    Types: Cigarettes, Pipe    Quit date: 09/26/1988  . Smokeless tobacco: Never Used  . Alcohol use Yes     Comment: 1.5-2 ounces of vodka nightly   . Drug use: No  . Sexual activity: Not on file   Other Topics Concern  . Not on file   Social History Narrative   Patient is Married Software engineer). Retired Engineer, mining) Administrator, sports. Lives in apartment, Independent Living section at [facility] since 2000.  Spouse lives in skilled nursing section in same community   Smoking 1994 , moderate alcohol use: 2 drinks a night. Exercises regularly with walking and exercise classes. Continues to drive   Patient has Advanced planning documents: Living Will               Functional Status Survey:    Allergies  Allergen Reactions  . Percocet  [Oxycodone-Acetaminophen] Other (See Comments)    Just doesn't like it  . Robaxin [Methocarbamol]   . Diovan [Valsartan] Rash    Pertinent  Health Maintenance Due  Topic Date Due  . INFLUENZA VACCINE  03/29/2016  . PNA vac Low Risk Adult (2 of 2 - PPSV23) 06/03/2016    Medications:   Medication List       Accurate as of 04/08/16 11:59 PM. Always use your most recent med list.          acetaminophen 325 MG tablet Commonly known as:  TYLENOL Take 2 tablets (650 mg total) by mouth every 6 (  six) hours as needed for mild pain or fever.   Coenzyme Q10 300 MG Caps Take 1 capsule by mouth daily.   finasteride 5 MG tablet Commonly known as:  PROSCAR Take 5 mg by mouth every morning.   levothyroxine 88 MCG tablet Commonly known as:  SYNTHROID, LEVOTHROID Take 88 mcg by mouth daily.   MULTIVITAMIN & MINERAL PO Take 1 tablet by mouth daily.   ondansetron 4 MG tablet Commonly known as:  ZOFRAN Take 1 tablet (4 mg total) by mouth every 6 (six) hours.   pravastatin 80 MG tablet Commonly known as:  PRAVACHOL Take 80 mg by mouth every evening.   predniSONE 2.5 MG tablet Commonly known as:  DELTASONE Take 2 tablets (5 mg) in the am and Take 1 tablet (2.5 mg) in the evening.   testosterone cypionate 100 MG/ML injection Commonly known as:  DEPO-TESTOSTERONE Inject 0.42ml intramuscularly once weekly   vitamin B-12 1000 MCG tablet Commonly known as:  CYANOCOBALAMIN Take 1,000 mcg by mouth daily.   VITAMIN D (ERGOCALCIFEROL) PO Take 2,000 Units by mouth daily.       Review of Systems  Constitutional: Negative for activity change, appetite change, chills, diaphoresis, fatigue, fever and unexpected weight change.  Respiratory: Negative for cough, shortness of breath, wheezing and stridor.   Cardiovascular: Negative for chest pain, palpitations and leg swelling.  Gastrointestinal: Negative for abdominal distention, abdominal pain, constipation, diarrhea, nausea and  vomiting.  Genitourinary: Negative for difficulty urinating and dysuria.  Musculoskeletal: Positive for arthralgias and gait problem. Negative for back pain, joint swelling and myalgias.  Skin: Negative for wound.  Neurological: Negative for dizziness, seizures, syncope, facial asymmetry, speech difficulty, weakness and headaches.  Hematological: Negative for adenopathy. Does not bruise/bleed easily.  Psychiatric/Behavioral: Negative for agitation, behavioral problems and confusion.    There were no vitals filed for this visit. There is no height or weight on file to calculate BMI. Physical Exam  Constitutional: He is oriented to person, place, and time. No distress.  HENT:  Head: Normocephalic and atraumatic.  Neck: Normal range of motion. Neck supple. No JVD present. No tracheal deviation present. No thyromegaly present.  Cardiovascular: Normal rate and regular rhythm.   No murmur heard. Pulmonary/Chest: Effort normal and breath sounds normal. No respiratory distress. He has no wheezes.  Abdominal: Soft. Bowel sounds are normal. He exhibits no distension. There is no tenderness.  Lymphadenopathy:    He has no cervical adenopathy.  Neurological: He is alert and oriented to person, place, and time. No cranial nerve deficit.  Skin: Skin is warm and dry. He is not diaphoretic.  Psychiatric: He has a normal mood and affect.  Nursing note and vitals reviewed.   Labs reviewed: Basic Metabolic Panel:  Recent Labs  10/20/15 2120  11/28/15 0406 11/29/15 0350 12/15/15 04/05/16 1738  NA 129*  < > 135 135 139 130*  K 3.7  < > 4.4 3.8 4.2 3.5  CL 96*  < > 104 101  --  95*  CO2 25  < > 22 23  --  30  GLUCOSE 84  < > 76 63*  --  94  BUN 14  < > 15 14 21 19   CREATININE 1.28*  < > 1.25* 1.21 1.1 1.22  CALCIUM 8.4*  < > 7.9* 8.2*  --  8.8*  MG 1.8  --   --   --   --   --   PHOS 2.8  --   --   --   --   --   < > =  values in this interval not displayed. Liver Function Tests:  Recent  Labs  10/22/15 0515 11/27/15 0027 04/05/16 1738  AST 15 27 15   ALT 12* 22 14*  ALKPHOS 43 49 48  BILITOT 1.2 0.9 1.1  PROT 5.6* 6.4* 5.9*  ALBUMIN 2.7* 3.7 3.4*    Recent Labs  07/24/15 2043 04/05/16 1738  LIPASE 43 34   No results for input(s): AMMONIA in the last 8760 hours. CBC:  Recent Labs  10/20/15 2120 10/21/15 0612 10/22/15 0515  11/27/15 0355 11/28/15 0406 04/05/16 1738  WBC 10.1 7.9 8.9  < > 11.6* 8.5 13.5*  NEUTROABS 5.8 4.9 6.7  --   --   --   --   HGB 10.3* 10.6* 11.9*  < > 10.4* 12.0* 12.7*  HCT 32.7* 33.4* 36.3*  < > 32.2* 36.0* 38.4*  MCV 96.2 96.3 96.0  < > 93.6 93.0 95.5  PLT 249 255 258  < > 195 190 240  < > = values in this interval not displayed. Cardiac Enzymes: No results for input(s): CKTOTAL, CKMB, CKMBINDEX, TROPONINI in the last 8760 hours. BNP: Invalid input(s): POCBNP CBG: No results for input(s): GLUCAP in the last 8760 hours.  Procedures and Imaging Studies During Stay: Ct Abdomen Pelvis W Contrast  Result Date: 04/05/2016 CLINICAL DATA:  Patient here from [place] with complaints of nausea, vomiting and low grade fever since last night. DNR. No Tylenol reports unable to keep anything down. Pt unable to tolerate oral contrast.^135mL ISOVUE-300 IOPAMIDOL (ISOVUE-300) INJECTION 61% EXAM: CT ABDOMEN AND PELVIS WITH CONTRAST TECHNIQUE: Multidetector CT imaging of the abdomen and pelvis was performed using the standard protocol following bolus administration of intravenous contrast. CONTRAST:  171mL ISOVUE-300 IOPAMIDOL (ISOVUE-300) INJECTION 61% COMPARISON:  07/25/2015 FINDINGS: Lower chest: There is minimal atelectasis or scarring at the right lung base. Elevation of the right hemidiaphragm is stable. There is coronary artery calcification. Hepatobiliary: Calcified granuloma identified within the right hepatic lobe. There is focal fatty infiltration adjacent to the falciform ligament. Otherwise the liver is normal in appearance. The  gallbladder is surgically absent. Pancreas: Normal in appearance. Spleen: Normal in appearance. Renal/Adrenal: Status post right nephrectomy. The adrenal glands are normal in appearance. Left renal cysts are again noted, stable in appearance. No hydronephrosis or ureteral obstruction. Gastrointestinal tract: The stomach and small bowel loops appear normal. There are scattered colonic diverticula. No acute diverticulitis. The appendix is well seen and has a normal appearance. Reproductive/Pelvis: The prostate is enlarged and partially calcified. Seminal vesicles are normal in appearance. No free pelvic fluid. Urinary bladder is normal in appearance. Vascular/Lymphatic: Atherosclerotic calcification of the abdominal aorta. No aneurysm. No retroperitoneal or mesenteric adenopathy. Musculoskeletal/Abdominal wall: Abdominal wall on is unremarkable. Degenerative changes are seen in the spine. No suspicious lytic or blastic lesions are identified. Other: none IMPRESSION: 1.  No evidence for acute  abnormality. 2. Coronary artery disease.  Aortic atherosclerosis. 3. Status post right nephrectomy. 4. Stable appearing left renal cysts. 5. No hydronephrosis. 6. Prostatic enlargement. 7. Colonic diverticulosis without acute diverticulitis. 8. Normal appendix. Electronically Signed   By: Nolon Nations M.D.   On: 04/05/2016 20:04    Assessment/Plan:    1. Generalized abdominal pain Resolved  2. Dehydration Resolved, eating and drinking well Discussed hydration strategies at home  3. Weakness Resolved, does not need therapy at this point as he is performing all ADL's independently Has a walker but only uses periodically  4. CKD (chronic kidney disease) stage 3, GFR 30-59 ml/min Stable  5. 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 Hypothyroidism (acquired) Continue synthroid 88 mcg po qd  7. Essential hypertension Controlled without  meds  8. HLD (hyperlipidemia) Continue pravachol 80 mg qd  Ready for [place] from rehab to [place]. F/U with Dr. Elyse Hsu (his PCP)   Cindi Carbon, [place] [phone]

## 2016-04-26 DIAGNOSIS — M79672 Pain in left foot: Secondary | ICD-10-CM | POA: Diagnosis not present

## 2016-04-26 DIAGNOSIS — M79671 Pain in right foot: Secondary | ICD-10-CM | POA: Diagnosis not present

## 2016-04-26 DIAGNOSIS — L84 Corns and callosities: Secondary | ICD-10-CM | POA: Diagnosis not present

## 2016-04-26 DIAGNOSIS — B351 Tinea unguium: Secondary | ICD-10-CM | POA: Diagnosis not present

## 2016-04-26 DIAGNOSIS — L89892 Pressure ulcer of other site, stage 2: Secondary | ICD-10-CM | POA: Diagnosis not present

## 2016-05-09 DIAGNOSIS — E7800 Pure hypercholesterolemia, unspecified: Secondary | ICD-10-CM | POA: Diagnosis not present

## 2016-05-09 DIAGNOSIS — E230 Hypopituitarism: Secondary | ICD-10-CM | POA: Diagnosis not present

## 2016-05-09 DIAGNOSIS — D510 Vitamin B12 deficiency anemia due to intrinsic factor deficiency: Secondary | ICD-10-CM | POA: Diagnosis not present

## 2016-05-09 DIAGNOSIS — E559 Vitamin D deficiency, unspecified: Secondary | ICD-10-CM | POA: Diagnosis not present

## 2016-05-13 DIAGNOSIS — H532 Diplopia: Secondary | ICD-10-CM | POA: Diagnosis not present

## 2016-05-13 DIAGNOSIS — H0289 Other specified disorders of eyelid: Secondary | ICD-10-CM | POA: Diagnosis not present

## 2016-05-18 DIAGNOSIS — H04123 Dry eye syndrome of bilateral lacrimal glands: Secondary | ICD-10-CM | POA: Diagnosis not present

## 2016-05-18 DIAGNOSIS — H0289 Other specified disorders of eyelid: Secondary | ICD-10-CM | POA: Diagnosis not present

## 2016-05-18 DIAGNOSIS — H16213 Exposure keratoconjunctivitis, bilateral: Secondary | ICD-10-CM | POA: Diagnosis not present

## 2016-05-18 DIAGNOSIS — L718 Other rosacea: Secondary | ICD-10-CM | POA: Diagnosis not present

## 2016-05-19 DIAGNOSIS — E7800 Pure hypercholesterolemia, unspecified: Secondary | ICD-10-CM | POA: Diagnosis not present

## 2016-05-19 DIAGNOSIS — Z23 Encounter for immunization: Secondary | ICD-10-CM | POA: Diagnosis not present

## 2016-05-19 DIAGNOSIS — I10 Essential (primary) hypertension: Secondary | ICD-10-CM | POA: Diagnosis not present

## 2016-05-19 DIAGNOSIS — D510 Vitamin B12 deficiency anemia due to intrinsic factor deficiency: Secondary | ICD-10-CM | POA: Diagnosis not present

## 2016-05-19 DIAGNOSIS — E559 Vitamin D deficiency, unspecified: Secondary | ICD-10-CM | POA: Diagnosis not present

## 2016-05-19 DIAGNOSIS — E230 Hypopituitarism: Secondary | ICD-10-CM | POA: Diagnosis not present

## 2016-06-14 DIAGNOSIS — R3915 Urgency of urination: Secondary | ICD-10-CM | POA: Diagnosis not present

## 2016-06-14 DIAGNOSIS — N401 Enlarged prostate with lower urinary tract symptoms: Secondary | ICD-10-CM | POA: Diagnosis not present

## 2016-06-14 DIAGNOSIS — R351 Nocturia: Secondary | ICD-10-CM | POA: Diagnosis not present

## 2016-06-15 DIAGNOSIS — Z96653 Presence of artificial knee joint, bilateral: Secondary | ICD-10-CM | POA: Diagnosis not present

## 2016-06-15 DIAGNOSIS — Z96652 Presence of left artificial knee joint: Secondary | ICD-10-CM | POA: Diagnosis not present

## 2016-06-15 DIAGNOSIS — M5416 Radiculopathy, lumbar region: Secondary | ICD-10-CM | POA: Diagnosis not present

## 2016-06-15 DIAGNOSIS — Z96651 Presence of right artificial knee joint: Secondary | ICD-10-CM | POA: Diagnosis not present

## 2016-06-15 DIAGNOSIS — Z471 Aftercare following joint replacement surgery: Secondary | ICD-10-CM | POA: Diagnosis not present

## 2016-06-24 DIAGNOSIS — M5416 Radiculopathy, lumbar region: Secondary | ICD-10-CM | POA: Diagnosis not present

## 2016-06-27 DIAGNOSIS — H532 Diplopia: Secondary | ICD-10-CM | POA: Diagnosis not present

## 2016-07-05 DIAGNOSIS — L309 Dermatitis, unspecified: Secondary | ICD-10-CM | POA: Diagnosis not present

## 2016-07-05 DIAGNOSIS — M79672 Pain in left foot: Secondary | ICD-10-CM | POA: Diagnosis not present

## 2016-07-05 DIAGNOSIS — B351 Tinea unguium: Secondary | ICD-10-CM | POA: Diagnosis not present

## 2016-07-05 DIAGNOSIS — M79671 Pain in right foot: Secondary | ICD-10-CM | POA: Diagnosis not present

## 2016-07-05 DIAGNOSIS — L84 Corns and callosities: Secondary | ICD-10-CM | POA: Diagnosis not present

## 2016-07-15 DIAGNOSIS — S32020D Wedge compression fracture of second lumbar vertebra, subsequent encounter for fracture with routine healing: Secondary | ICD-10-CM | POA: Diagnosis not present

## 2016-07-15 DIAGNOSIS — M4807 Spinal stenosis, lumbosacral region: Secondary | ICD-10-CM | POA: Diagnosis not present

## 2016-09-08 DIAGNOSIS — B20 Human immunodeficiency virus [HIV] disease: Secondary | ICD-10-CM | POA: Diagnosis not present

## 2016-09-08 DIAGNOSIS — I482 Chronic atrial fibrillation: Secondary | ICD-10-CM | POA: Diagnosis not present

## 2016-09-08 DIAGNOSIS — D649 Anemia, unspecified: Secondary | ICD-10-CM | POA: Diagnosis not present

## 2016-09-08 DIAGNOSIS — R509 Fever, unspecified: Secondary | ICD-10-CM | POA: Diagnosis not present

## 2016-09-08 LAB — CBC AND DIFFERENTIAL
HCT: 39 % — AB (ref 41–53)
Hemoglobin: 12.6 g/dL — AB (ref 13.5–17.5)
Platelets: 178 10*3/uL (ref 150–399)
WBC: 11.2 10^3/mL

## 2016-09-09 LAB — BASIC METABOLIC PANEL
BUN: 13 mg/dL (ref 4–21)
Creatinine: 1.1 mg/dL (ref 0.6–1.3)
Glucose: 62 mg/dL
Potassium: 3.8 mmol/L (ref 3.4–5.3)
Sodium: 127 mmol/L — AB (ref 137–147)

## 2016-09-10 LAB — BASIC METABOLIC PANEL
BUN: 9 mg/dL (ref 4–21)
Creatinine: 1 mg/dL (ref 0.6–1.3)
Glucose: 105 mg/dL
Potassium: 3.5 mmol/L (ref 3.4–5.3)
Sodium: 128 mmol/L — AB (ref 137–147)

## 2016-09-12 DIAGNOSIS — M6281 Muscle weakness (generalized): Secondary | ICD-10-CM | POA: Diagnosis not present

## 2016-09-12 DIAGNOSIS — E860 Dehydration: Secondary | ICD-10-CM | POA: Diagnosis not present

## 2016-09-12 LAB — BASIC METABOLIC PANEL
BUN: 11 mg/dL (ref 4–21)
Creatinine: 0.9 mg/dL (ref 0.6–1.3)
Glucose: 113 mg/dL
Potassium: 4.6 mmol/L (ref 3.4–5.3)
Sodium: 136 mmol/L — AB (ref 137–147)

## 2016-09-13 ENCOUNTER — Encounter: Payer: Self-pay | Admitting: Internal Medicine

## 2016-09-13 ENCOUNTER — Non-Acute Institutional Stay (SKILLED_NURSING_FACILITY): Payer: PPO | Admitting: Internal Medicine

## 2016-09-13 DIAGNOSIS — R5383 Other fatigue: Secondary | ICD-10-CM

## 2016-09-13 DIAGNOSIS — N183 Chronic kidney disease, stage 3 unspecified: Secondary | ICD-10-CM

## 2016-09-13 DIAGNOSIS — R5381 Other malaise: Secondary | ICD-10-CM

## 2016-09-13 DIAGNOSIS — N179 Acute kidney failure, unspecified: Secondary | ICD-10-CM | POA: Diagnosis not present

## 2016-09-13 DIAGNOSIS — E236 Other disorders of pituitary gland: Secondary | ICD-10-CM

## 2016-09-13 DIAGNOSIS — E871 Hypo-osmolality and hyponatremia: Secondary | ICD-10-CM

## 2016-09-13 DIAGNOSIS — R531 Weakness: Secondary | ICD-10-CM | POA: Diagnosis not present

## 2016-09-13 DIAGNOSIS — E893 Postprocedural hypopituitarism: Secondary | ICD-10-CM

## 2016-09-13 NOTE — Progress Notes (Signed)
Patient ID: [name] Stupar., male   DOB: August 15, 1923, 83 y.o.   MRN: [id number]  Provider:  Rexene Edison. Mariea Clonts, D.O., C.M.D. Location:  [facility] Room Number: [place] of Service:  SNF (31)  PCP: [name], MD Patient Care Team: Lorne Skeens, MD as PCP - General (Endocrinology) [facility]  Extended Emergency Contact Information Primary Emergency Contact: Buchanan,[name] Address: [address]          [place], [place] [address] [name] of [place] Phone: [phone] Mobile Phone: [phone] Relation: Son Secondary Emergency Contact: Hebenstreit,[name] Address: [address]          [place], [place] [address] [name] of [place] Phone: [phone] Relation: Son  Code Status: DNR Goals of Care: Advanced Directive information Advanced Directives 09/13/2016  Does Patient Have a Medical Advance Directive? Yes  Type of Paramedic of [place];Living will  Does patient want to make changes to medical advance directive? No - Patient declined  Copy of [place] in Chart? Yes  Would patient like information on creating a medical advance directive? -  Pre-existing out of facility DNR order (yellow form or pink MOST form) -   Chief Complaint  Patient presents with  . New Admit To SNF    Rehab admission    HPI: Patient is a 83 y.o. male with h/o diverticulosis, multiple falls, panhypopituitarism, BPH, anemia, seen today for admission to [facility] rehab due to weakness, nausea and vomiting.  He reports that since last Wed evening at dinner, he did not have any significant po intake.  He actually left the [facility] dining room early that evening feeling bad.  He did vomit that night.  He's not clear on how often he vomiting after that.  He chose not to attend various activities.  He contacted the [place] nurse who encouraged him to gradually take po and let her know if he  did not improve.  He also was noted to be incontinent which is unlike him.  He was not getting better and staff became more concerned about being confused and weak.  Labs were obtained showing hyponatremia to 127 primarily and acute renal failure with cr 1.14, BUN 13 on 1/11 and slight WBC count of 11.2.  He was admitted to rehab 1/12 for IVFs and labs have since improved (Na 136, cr 0.9, BUN 11). He is back to eating since Sunday, 1/14 and ate very well, but his hydration is still not very good per nursing notes on fluid intake.    He remains very unsteady and weak.  It took him 3 tries to get up out of his recliner even with his arms when he wanted to walk somewhere where we could sit together and talk.  He then had the same problem after our meeting and had two near falls in a row after getting up.  He was focused on the carpet with a pattern on it and it seemed to affect his balance even more.  He remained confused this am--he said he knew he was in rehab but was searching for a bathroom in the hallway. He also introduced me to the rehab nurse like we don't know each other, and did not recall what room he was in when I was walking back with him.  He did not initially agree to PT, but staff were going to continue to encourage it after the above occurred.  Past Medical History:  Diagnosis Date  . Allergic rhinitis    Prior allergy shots 20 years  . Anemia   . Anemia, B12 deficiency 2000  . Arthritis    Status post left total replacement 8 2011  . BPH (benign prostatic hyperplasia) 2013  . Cancer ([place])    renal cell ca and skin cancer   . Colitis 2014  . Difficult intubation 1994   surgery had to be  stopped due to injury to "throat"  . Difficult intubation 1994   no trouble since.  Required nasotracheal intubation  '02 [facility] / ANESTHESIA RECORD FROM 2013 AND 2016 IN EPIC  . Glaucoma 2007   Status post left trabeculectomy 2007  . History of colon polyps    Colonoscopy 2001  . History of  kidney stones   . History of renal cell carcinoma 1994   Status post right nephrectomy  . Hyperlipidemia 2003  . Hypertension   . 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 Hypothyroidism (acquired) 2000  . Nocturia   . Pituitary mass ([place]) 2000   S/p transphenoidal excision 05/1999 ([facility])  . Vitamin D deficiency 2009   Past Surgical History:  Procedure Laterality Date  . BRAIN SURGERY  2000   pituatary gland removed .  [facility] CHOLECYSTECTOMY  1984  . Ectropion surgery Bilateral 2006  . EYE SURGERY  over last 6 yrs.     trabeculectomy...   . EYE SURGERY   cat ext ou  . JOINT REPLACEMENT  2011   knee left  . KNEE ARTHROSCOPY Right 02/06/2015   Procedure: RIGHT ARTHROSCOPY KNEE WITH LATERAL MENSICAL  DEBRIDEMENT;  Surgeon: Gaynelle Arabian, MD;  Location: [facility];  Service: Orthopedics;  Laterality: Right;  . Mohs procedure      for skin cancer on nose   . NEPHRECTOMY Right 1994   Renal cell  . REVERSE SHOULDER ARTHROPLASTY  12/15/2011   Procedure: REVERSE SHOULDER ARTHROPLASTY;  Surgeon: Marin Shutter, MD;  Location: [facility];  Service: Orthopedics;  Laterality: Right;  right total reverse shoulder  . TONSILLECTOMY    . TOTAL KNEE ARTHROPLASTY Right 06/29/2015   Procedure: TOTAL KNEE ARTHROPLASTY;  Surgeon: Gaynelle Arabian, MD;  Location: [facility];  Service: Orthopedics;  Laterality: Right;  . Transsphenoidal excision pituitary tumor  05/1999   A.Tommi Rumps, M.D.([facility])    reports that he quit smoking about 27 years ago. His smoking use included Cigarettes and Pipe. He has never used smokeless tobacco. He reports that he drinks alcohol. He reports that he does not use drugs. Social History   Social History  . Marital status: Widowed    Spouse name: N/A  . Number of children: N/A  . Years of education: N/A   Occupational History  . Not on file.   Social History Main Topics  . Smoking status: Former Smoker    Types: Cigarettes, Pipe     Quit date: 09/26/1988  . Smokeless tobacco: Never Used  . Alcohol use Yes     Comment: 1.5-2 ounces of vodka nightly   . Drug use: No  . Sexual activity: Not on file   Other Topics Concern  . Not on file   Social History Narrative   Patient is Married Software engineer). Retired Engineer, mining) Administrator, sports. Lives in apartment, Independent Living section at [facility] since 2000.  Spouse lives in skilled nursing section in same community   Smoking 1994 , moderate alcohol use: 2 drinks a night. Exercises  regularly with walking and exercise classes. Continues to drive   Patient has Advanced planning documents: Living Will                Functional Status Survey:    Family History  Problem Relation Age of Onset  . Lung cancer Father   . Anesthesia problems Neg Hx   . Hypotension Neg Hx   . Malignant hyperthermia Neg Hx   . Pseudochol deficiency Neg Hx     Health Maintenance  Topic Date Due  . TETANUS/TDAP  01/22/1944  . ZOSTAVAX  01/21/1985  . INFLUENZA VACCINE  03/29/2016  . PNA vac Low Risk Adult (2 of 2 - PPSV23) 06/03/2016    Allergies  Allergen Reactions  . Percocet [Oxycodone-Acetaminophen] Other (See Comments)    Just doesn't like it  . Robaxin [Methocarbamol]   . Diovan [Valsartan] Rash    Allergies as of 09/13/2016      Reactions   Percocet [oxycodone-acetaminophen] Other (See Comments)   Just doesn't like it   Robaxin [methocarbamol]    Diovan [valsartan] Rash      Medication List       Accurate as of 09/13/16 10:09 AM. Always use your most recent med list.          acetaminophen 325 MG tablet Commonly known as:  TYLENOL Take 2 tablets (650 mg total) by mouth every 6 (six) hours as needed for mild pain or fever.   Coenzyme Q10 300 MG Caps Take 1 capsule by mouth daily.   finasteride 5 MG tablet Commonly known as:  PROSCAR Take 5 mg by mouth every morning.   levothyroxine 88 MCG tablet Commonly known as:   SYNTHROID, LEVOTHROID Take 88 mcg by mouth daily.   MULTIVITAMIN & MINERAL PO Take 1 tablet by mouth daily.   ondansetron 4 MG tablet Commonly known as:  ZOFRAN Take 1 tablet (4 mg total) by mouth every 6 (six) hours.   pravastatin 80 MG tablet Commonly known as:  PRAVACHOL Take 80 mg by mouth every evening.   predniSONE 2.5 MG tablet Commonly known as:  DELTASONE Take 2 tablets (5 mg) in the am and Take 1 tablet (2.5 mg) in the evening.   testosterone cypionate 100 MG/ML injection Commonly known as:  DEPO-TESTOSTERONE Inject 0.50ml intramuscularly once weekly   vitamin B-12 1000 MCG tablet Commonly known as:  CYANOCOBALAMIN Take 1,000 mcg by mouth daily.   VITAMIN D (ERGOCALCIFEROL) PO Take 2,000 Units by mouth daily.       Review of Systems  Constitutional: Positive for appetite change. Negative for chills, fever and unexpected weight change.  HENT: Negative for congestion.   Respiratory: Negative for cough and shortness of breath.   Cardiovascular: Negative for chest pain and leg swelling.  Gastrointestinal: Negative for abdominal distention, abdominal pain, constipation and diarrhea.  Genitourinary: Negative for dysuria.  Musculoskeletal: Positive for arthralgias and gait problem.       Very unsteady on standing and starting to walk, weak  Skin: Positive for pallor.  Neurological: Positive for weakness and light-headedness. Negative for speech difficulty.  Hematological: Bruises/bleeds easily.  Psychiatric/Behavioral: Positive for confusion.    Vitals:   09/13/16 0956  BP: (!) 154/82  Pulse: 68  Resp: 16  Temp: 97.1 F (36.2 C)  TempSrc: Oral  SpO2: 96%  Weight: 180 lb (81.6 kg)   Body mass index is 25.1 kg/m. Physical Exam  Constitutional: He appears well-nourished. No distress.  HENT:  Head: Normocephalic and atraumatic.  Right  Ear: External ear normal.  Left Ear: External ear normal.  Nose: Nose normal.  Mouth/Throat: Oropharynx is clear and  moist.  Eyes: Conjunctivae and EOM are normal. Pupils are equal, round, and reactive to light.  Neck: Normal range of motion. Neck supple. No JVD present.  Cardiovascular: Normal rate, regular rhythm, normal heart sounds and intact distal pulses.   Pulmonary/Chest: Effort normal and breath sounds normal. No respiratory distress. He has no wheezes. He has no rales.  Abdominal: Soft. Bowel sounds are normal. He exhibits no distension and no mass. There is no tenderness. There is no rebound and no guarding.  Musculoskeletal: Normal range of motion.  Very weak getting up out of chair and on standing very unsteady (see hpi)  Lymphadenopathy:    He has no cervical adenopathy.  Neurological: He is alert.  Oriented to person, place, time, but confused; also it was unusual for him to leave his room in his PJs (typically very dapper and concerned about appearance)  Skin: Skin is warm and dry.  Psychiatric: He has a normal mood and affect.    Labs reviewed: Basic Metabolic Panel:  Recent Labs  10/20/15 2120  11/28/15 0406 11/29/15 0350  04/05/16 1738 09/09/16 0726 09/10/16 0726  NA 129*  < > 135 135  < > 130* 127* 128*  K 3.7  < > 4.4 3.8  < > 3.5 3.8 3.5  CL 96*  < > 104 101  --  95*  --   --   CO2 25  < > 22 23  --  30  --   --   GLUCOSE 84  < > 76 63*  --  94  --   --   BUN 14  < > 15 14  < > 19 13 9   CREATININE 1.28*  < > 1.25* 1.21  < > 1.22 1.1 1.0  CALCIUM 8.4*  < > 7.9* 8.2*  --  8.8*  --   --   MG 1.8  --   --   --   --   --   --   --   PHOS 2.8  --   --   --   --   --   --   --   < > = values in this interval not displayed. Liver Function Tests:  Recent Labs  10/22/15 0515 11/27/15 0027 04/05/16 1738  AST 15 27 15   ALT 12* 22 14*  ALKPHOS 43 49 48  BILITOT 1.2 0.9 1.1  PROT 5.6* 6.4* 5.9*  ALBUMIN 2.7* 3.7 3.4*    Recent Labs  04/05/16 1738  LIPASE 34   No results for input(s): AMMONIA in the last 8760 hours. CBC:  Recent Labs  10/20/15 2120  10/21/15 0612 10/22/15 0515  11/27/15 0355 11/28/15 0406 04/05/16 1738 09/08/16 0726  WBC 10.1 7.9 8.9  < > 11.6* 8.5 13.5* 11.2  NEUTROABS 5.8 4.9 6.7  --   --   --   --   --   HGB 10.3* 10.6* 11.9*  < > 10.4* 12.0* 12.7* 12.6*  HCT 32.7* 33.4* 36.3*  < > 32.2* 36.0* 38.4* 39*  MCV 96.2 96.3 96.0  < > 93.6 93.0 95.5  --   PLT 249 255 258  < > 195 190 240 178  < > = values in this interval not displayed. Cardiac Enzymes: No results for input(s): CKTOTAL, CKMB, CKMBINDEX, TROPONINI in the last 8760 hours. BNP: Invalid input(s): POCBNP No results found for:  HGBA1C Lab Results  Component Value Date   TSH 0.031 (L) 10/21/2015   No results found for: VITAMINB12 No results found for: FOLATE No results found for: IRON, TIBC, FERRITIN  Imaging and Procedures obtained prior to SNF admission: Ct Abdomen Pelvis W Contrast  Result Date: 04/05/2016 CLINICAL DATA:  Patient here from [facility] with complaints of nausea, vomiting and low grade fever since last night. DNR. No Tylenol reports unable to keep anything down. Pt unable to tolerate oral contrast.^160mL ISOVUE-300 IOPAMIDOL (ISOVUE-300) INJECTION 61% EXAM: CT ABDOMEN AND PELVIS WITH CONTRAST TECHNIQUE: Multidetector CT imaging of the abdomen and pelvis was performed using the standard protocol following bolus administration of intravenous contrast. CONTRAST:  123mL ISOVUE-300 IOPAMIDOL (ISOVUE-300) INJECTION 61% COMPARISON:  07/25/2015 FINDINGS: Lower chest: There is minimal atelectasis or scarring at the right lung base. Elevation of the right hemidiaphragm is stable. There is coronary artery calcification. Hepatobiliary: Calcified granuloma identified within the right hepatic lobe. There is focal fatty infiltration adjacent to the falciform ligament. Otherwise the liver is normal in appearance. The gallbladder is surgically absent. Pancreas: Normal in appearance. Spleen: Normal in appearance. Renal/Adrenal: Status post right nephrectomy. The  adrenal glands are normal in appearance. Left renal cysts are again noted, stable in appearance. No hydronephrosis or ureteral obstruction. Gastrointestinal tract: The stomach and small bowel loops appear normal. There are scattered colonic diverticula. No acute diverticulitis. The appendix is well seen and has a normal appearance. Reproductive/Pelvis: The prostate is enlarged and partially calcified. Seminal vesicles are normal in appearance. No free pelvic fluid. Urinary bladder is normal in appearance. Vascular/Lymphatic: Atherosclerotic calcification of the abdominal aorta. No aneurysm. No retroperitoneal or mesenteric adenopathy. Musculoskeletal/Abdominal wall: Abdominal wall on is unremarkable. Degenerative changes are seen in the spine. No suspicious lytic or blastic lesions are identified. Other: none IMPRESSION: 1.  No evidence for acute  abnormality. 2. Coronary artery disease.  Aortic atherosclerosis. 3. Status post right nephrectomy. 4. Stable appearing left renal cysts. 5. No hydronephrosis. 6. Prostatic enlargement. 7. Colonic diverticulosis without acute diverticulitis. 8. Normal appendix. Electronically Signed   By: Nolon Nations M.D.   On: 04/05/2016 20:04    Assessment/Plan 1. Hyponatremia -Na has corrected (hopefully not too quickly over the weekend) -he remains quite confused -repeat bmp again today to ensure he is not getting more dehydrated again off IVFs -push po fluids and follow strict I/Os  2. Acute renal failure, unspecified acute renal failure type ([place]) -resolved with IVfs, but I'm concerned he's still not drinking enough though eating, remains confused  3. CKD (chronic kidney disease) stage 3, GFR 30-59 ml/min -baseline now, encourage po intake, f/u lab, avoid nephrotoxic meds and dose adjust as needed  4. Weakness -due to n/v/d he had (probably norovirus) -improving, but still extremely unsteady and had two near falls in my presence -needs PT for strengthening and  balance  5. 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 Malaise and fatigue -due to lack of intake for 4 days! Causing acute renal failure and hyponatremia -PT ordered for strengthening and pushing fluids with strict I/Os  Family/ staff Communication: discussed with rehab RN, nurse manager  Labs/tests ordered:  F/u bmp

## 2016-09-14 DIAGNOSIS — G4751 Confusional arousals: Secondary | ICD-10-CM | POA: Diagnosis not present

## 2016-09-22 DIAGNOSIS — E7800 Pure hypercholesterolemia, unspecified: Secondary | ICD-10-CM | POA: Diagnosis not present

## 2016-09-22 DIAGNOSIS — E230 Hypopituitarism: Secondary | ICD-10-CM | POA: Diagnosis not present

## 2016-09-22 DIAGNOSIS — D510 Vitamin B12 deficiency anemia due to intrinsic factor deficiency: Secondary | ICD-10-CM | POA: Diagnosis not present

## 2016-09-22 DIAGNOSIS — I10 Essential (primary) hypertension: Secondary | ICD-10-CM | POA: Diagnosis not present

## 2016-09-22 DIAGNOSIS — E559 Vitamin D deficiency, unspecified: Secondary | ICD-10-CM | POA: Diagnosis not present

## 2016-10-04 DIAGNOSIS — L84 Corns and callosities: Secondary | ICD-10-CM | POA: Diagnosis not present

## 2016-10-04 DIAGNOSIS — M79672 Pain in left foot: Secondary | ICD-10-CM | POA: Diagnosis not present

## 2016-10-04 DIAGNOSIS — B351 Tinea unguium: Secondary | ICD-10-CM | POA: Diagnosis not present

## 2016-10-04 DIAGNOSIS — M79671 Pain in right foot: Secondary | ICD-10-CM | POA: Diagnosis not present

## 2016-10-11 DIAGNOSIS — R05 Cough: Secondary | ICD-10-CM | POA: Diagnosis not present

## 2016-10-11 DIAGNOSIS — R6883 Chills (without fever): Secondary | ICD-10-CM | POA: Diagnosis not present

## 2016-10-12 ENCOUNTER — Inpatient Hospital Stay (HOSPITAL_COMMUNITY)
Admission: EM | Admit: 2016-10-12 | Discharge: 2016-10-14 | DRG: 193 | Disposition: A | Discharge status: Skilled Nursing Facility | Payer: PPO | Attending: Family Medicine | Admitting: Family Medicine

## 2016-10-12 ENCOUNTER — Emergency Department (HOSPITAL_COMMUNITY): Payer: PPO

## 2016-10-12 ENCOUNTER — Encounter (HOSPITAL_COMMUNITY): Payer: Self-pay | Admitting: *Deleted

## 2016-10-12 DIAGNOSIS — E785 Hyperlipidemia, unspecified: Secondary | ICD-10-CM | POA: Diagnosis present

## 2016-10-12 DIAGNOSIS — R4182 Altered mental status, unspecified: Secondary | ICD-10-CM | POA: Diagnosis present

## 2016-10-12 DIAGNOSIS — Z85828 Personal history of other malignant neoplasm of skin: Secondary | ICD-10-CM | POA: Diagnosis not present

## 2016-10-12 DIAGNOSIS — I452 Bifascicular block: Secondary | ICD-10-CM | POA: Diagnosis present

## 2016-10-12 DIAGNOSIS — D72829 Elevated white blood cell count, unspecified: Secondary | ICD-10-CM | POA: Diagnosis present

## 2016-10-12 DIAGNOSIS — W19XXXA Unspecified fall, initial encounter: Secondary | ICD-10-CM | POA: Diagnosis not present

## 2016-10-12 DIAGNOSIS — Z801 Family history of malignant neoplasm of trachea, bronchus and lung: Secondary | ICD-10-CM | POA: Diagnosis not present

## 2016-10-12 DIAGNOSIS — R531 Weakness: Secondary | ICD-10-CM

## 2016-10-12 DIAGNOSIS — E893 Postprocedural hypopituitarism: Secondary | ICD-10-CM | POA: Diagnosis present

## 2016-10-12 DIAGNOSIS — I10 Essential (primary) hypertension: Secondary | ICD-10-CM | POA: Diagnosis present

## 2016-10-12 DIAGNOSIS — E860 Dehydration: Secondary | ICD-10-CM | POA: Diagnosis present

## 2016-10-12 DIAGNOSIS — N170 Acute kidney failure with tubular necrosis: Secondary | ICD-10-CM | POA: Diagnosis not present

## 2016-10-12 DIAGNOSIS — N183 Chronic kidney disease, stage 3 (moderate): Secondary | ICD-10-CM | POA: Diagnosis not present

## 2016-10-12 DIAGNOSIS — Z6825 Body mass index (BMI) 25.0-25.9, adult: Secondary | ICD-10-CM

## 2016-10-12 DIAGNOSIS — R410 Disorientation, unspecified: Secondary | ICD-10-CM | POA: Diagnosis not present

## 2016-10-12 DIAGNOSIS — E871 Hypo-osmolality and hyponatremia: Secondary | ICD-10-CM | POA: Diagnosis present

## 2016-10-12 DIAGNOSIS — N179 Acute kidney failure, unspecified: Secondary | ICD-10-CM | POA: Diagnosis present

## 2016-10-12 DIAGNOSIS — R7989 Other specified abnormal findings of blood chemistry: Secondary | ICD-10-CM

## 2016-10-12 DIAGNOSIS — Z885 Allergy status to narcotic agent status: Secondary | ICD-10-CM

## 2016-10-12 DIAGNOSIS — Y92099 Unspecified place in other non-institutional residence as the place of occurrence of the external cause: Secondary | ICD-10-CM

## 2016-10-12 DIAGNOSIS — Z888 Allergy status to other drugs, medicaments and biological substances status: Secondary | ICD-10-CM

## 2016-10-12 DIAGNOSIS — Z905 Acquired absence of kidney: Secondary | ICD-10-CM | POA: Diagnosis not present

## 2016-10-12 DIAGNOSIS — S51811A Laceration without foreign body of right forearm, initial encounter: Secondary | ICD-10-CM | POA: Diagnosis not present

## 2016-10-12 DIAGNOSIS — E236 Other disorders of pituitary gland: Secondary | ICD-10-CM

## 2016-10-12 DIAGNOSIS — E039 Hypothyroidism, unspecified: Secondary | ICD-10-CM | POA: Diagnosis not present

## 2016-10-12 DIAGNOSIS — E0590 Thyrotoxicosis, unspecified without thyrotoxic crisis or storm: Secondary | ICD-10-CM | POA: Diagnosis not present

## 2016-10-12 DIAGNOSIS — Z66 Do not resuscitate: Secondary | ICD-10-CM | POA: Diagnosis not present

## 2016-10-12 DIAGNOSIS — Y92009 Unspecified place in unspecified non-institutional (private) residence as the place of occurrence of the external cause: Secondary | ICD-10-CM

## 2016-10-12 DIAGNOSIS — Z7952 Long term (current) use of systemic steroids: Secondary | ICD-10-CM

## 2016-10-12 DIAGNOSIS — E43 Unspecified severe protein-calorie malnutrition: Secondary | ICD-10-CM | POA: Diagnosis not present

## 2016-10-12 DIAGNOSIS — J101 Influenza due to other identified influenza virus with other respiratory manifestations: Principal | ICD-10-CM | POA: Diagnosis present

## 2016-10-12 DIAGNOSIS — R509 Fever, unspecified: Secondary | ICD-10-CM | POA: Diagnosis present

## 2016-10-12 DIAGNOSIS — I248 Other forms of acute ischemic heart disease: Secondary | ICD-10-CM | POA: Diagnosis present

## 2016-10-12 DIAGNOSIS — R748 Abnormal levels of other serum enzymes: Secondary | ICD-10-CM | POA: Diagnosis not present

## 2016-10-12 DIAGNOSIS — Z87891 Personal history of nicotine dependence: Secondary | ICD-10-CM | POA: Diagnosis not present

## 2016-10-12 DIAGNOSIS — A419 Sepsis, unspecified organism: Secondary | ICD-10-CM

## 2016-10-12 DIAGNOSIS — Z85528 Personal history of other malignant neoplasm of kidney: Secondary | ICD-10-CM | POA: Diagnosis not present

## 2016-10-12 DIAGNOSIS — I129 Hypertensive chronic kidney disease with stage 1 through stage 4 chronic kidney disease, or unspecified chronic kidney disease: Secondary | ICD-10-CM | POA: Diagnosis not present

## 2016-10-12 DIAGNOSIS — Z87442 Personal history of urinary calculi: Secondary | ICD-10-CM

## 2016-10-12 DIAGNOSIS — R778 Other specified abnormalities of plasma proteins: Secondary | ICD-10-CM

## 2016-10-12 LAB — CBC WITH DIFFERENTIAL/PLATELET
BASOS PCT: 0 %
Basophils Absolute: 0 10*3/uL (ref 0.0–0.1)
EOS ABS: 0.1 10*3/uL (ref 0.0–0.7)
Eosinophils Relative: 1 %
HCT: 40.6 % (ref 39.0–52.0)
Hemoglobin: 13.6 g/dL (ref 13.0–17.0)
Lymphocytes Relative: 23 %
Lymphs Abs: 3 10*3/uL (ref 0.7–4.0)
MCH: 32.9 pg (ref 26.0–34.0)
MCHC: 33.5 g/dL (ref 30.0–36.0)
MCV: 98.3 fL (ref 78.0–100.0)
MONO ABS: 1.4 10*3/uL — AB (ref 0.1–1.0)
Monocytes Relative: 11 %
NEUTROS ABS: 8.5 10*3/uL — AB (ref 1.7–7.7)
NEUTROS PCT: 65 %
PLATELETS: 187 10*3/uL (ref 150–400)
RBC: 4.13 MIL/uL — ABNORMAL LOW (ref 4.22–5.81)
RDW: 15.2 % (ref 11.5–15.5)
WBC: 13 10*3/uL — ABNORMAL HIGH (ref 4.0–10.5)

## 2016-10-12 LAB — URINALYSIS, ROUTINE W REFLEX MICROSCOPIC
Bacteria, UA: NONE SEEN
Bilirubin Urine: NEGATIVE
GLUCOSE, UA: NEGATIVE mg/dL
Ketones, ur: 5 mg/dL — AB
Leukocytes, UA: NEGATIVE
Nitrite: NEGATIVE
PH: 7 (ref 5.0–8.0)
PROTEIN: NEGATIVE mg/dL
Specific Gravity, Urine: 1.01 (ref 1.005–1.030)
Squamous Epithelial / LPF: NONE SEEN

## 2016-10-12 LAB — INFLUENZA PANEL BY PCR (TYPE A & B)
Influenza A By PCR: POSITIVE — AB
Influenza B By PCR: NEGATIVE

## 2016-10-12 LAB — CREATININE, SERUM
Creatinine, Ser: 1.37 mg/dL — ABNORMAL HIGH (ref 0.61–1.24)
GFR calc Af Amer: 50 mL/min — ABNORMAL LOW (ref >60)
GFR, EST NON AFRICAN AMERICAN: 43 mL/min — AB (ref >60)

## 2016-10-12 LAB — I-STAT CG4 LACTIC ACID, ED
LACTIC ACID, VENOUS: 0.86 mmol/L (ref 0.5–1.9)
Lactic Acid, Venous: 1.62 mmol/L (ref 0.5–1.9)

## 2016-10-12 LAB — CBC
HEMATOCRIT: 39.9 % (ref 39.0–52.0)
Hemoglobin: 13.4 g/dL (ref 13.0–17.0)
MCH: 32.8 pg (ref 26.0–34.0)
MCHC: 33.6 g/dL (ref 30.0–36.0)
MCV: 97.8 fL (ref 78.0–100.0)
PLATELETS: 167 10*3/uL (ref 150–400)
RBC: 4.08 MIL/uL — ABNORMAL LOW (ref 4.22–5.81)
RDW: 15 % (ref 11.5–15.5)
WBC: 12.7 10*3/uL — ABNORMAL HIGH (ref 4.0–10.5)

## 2016-10-12 LAB — COMPREHENSIVE METABOLIC PANEL
ALK PHOS: 43 U/L (ref 38–126)
ALT: 20 U/L (ref 17–63)
AST: 26 U/L (ref 15–41)
Albumin: 3.6 g/dL (ref 3.5–5.0)
Anion gap: 8 (ref 5–15)
BILIRUBIN TOTAL: 1.3 mg/dL — AB (ref 0.3–1.2)
BUN: 18 mg/dL (ref 6–20)
CALCIUM: 8.8 mg/dL — AB (ref 8.9–10.3)
CO2: 29 mmol/L (ref 22–32)
CREATININE: 1.46 mg/dL — AB (ref 0.61–1.24)
Chloride: 94 mmol/L — ABNORMAL LOW (ref 101–111)
GFR calc non Af Amer: 40 mL/min — ABNORMAL LOW (ref >60)
GFR, EST AFRICAN AMERICAN: 47 mL/min — AB (ref >60)
GLUCOSE: 84 mg/dL (ref 65–99)
Potassium: 3.7 mmol/L (ref 3.5–5.1)
SODIUM: 131 mmol/L — AB (ref 135–145)
Total Protein: 6.5 g/dL (ref 6.5–8.1)

## 2016-10-12 LAB — TSH: TSH: <0.01 u[IU]/mL — ABNORMAL LOW (ref 0.350–4.500)

## 2016-10-12 LAB — TROPONIN I: Troponin I: 0.12 ng/mL (ref <0.03)

## 2016-10-12 MED ORDER — LEVOTHYROXINE SODIUM 88 MCG PO TABS
88.0000 ug | ORAL_TABLET | Freq: Every day | ORAL | Status: DC
  Administered 2016-10-13: 88 ug via ORAL
  Filled 2016-10-12: qty 1

## 2016-10-12 MED ORDER — FINASTERIDE 5 MG PO TABS
5.0000 mg | ORAL_TABLET | Freq: Every morning | ORAL | Status: DC
  Administered 2016-10-13 – 2016-10-14 (×2): 5 mg via ORAL
  Filled 2016-10-12 (×2): qty 1

## 2016-10-12 MED ORDER — SODIUM CHLORIDE 0.9 % IV BOLUS (SEPSIS)
500.0000 mL | Freq: Once | INTRAVENOUS | Status: AC
  Administered 2016-10-12: 500 mL via INTRAVENOUS

## 2016-10-12 MED ORDER — PRAVASTATIN SODIUM 80 MG PO TABS
80.0000 mg | ORAL_TABLET | Freq: Every evening | ORAL | Status: DC
  Administered 2016-10-12 – 2016-10-13 (×2): 80 mg via ORAL
  Filled 2016-10-12 (×3): qty 1

## 2016-10-12 MED ORDER — VITAMIN B-12 1000 MCG PO TABS
1000.0000 ug | ORAL_TABLET | Freq: Every day | ORAL | Status: DC
  Administered 2016-10-12 – 2016-10-14 (×3): 1000 ug via ORAL
  Filled 2016-10-12 (×3): qty 1

## 2016-10-12 MED ORDER — AMLODIPINE BESYLATE 5 MG PO TABS
5.0000 mg | ORAL_TABLET | Freq: Every day | ORAL | Status: DC
  Administered 2016-10-12 – 2016-10-14 (×2): 5 mg via ORAL
  Filled 2016-10-12 (×3): qty 1

## 2016-10-12 MED ORDER — SODIUM CHLORIDE 0.9 % IV SOLN
INTRAVENOUS | Status: AC
  Administered 2016-10-12: 19:00:00 via INTRAVENOUS

## 2016-10-12 MED ORDER — SODIUM CHLORIDE 0.9 % IV BOLUS (SEPSIS)
1000.0000 mL | Freq: Once | INTRAVENOUS | Status: AC
  Administered 2016-10-12: 1000 mL via INTRAVENOUS

## 2016-10-12 MED ORDER — SENNOSIDES-DOCUSATE SODIUM 8.6-50 MG PO TABS: 1.0000 | ORAL_TABLET | Freq: Every evening | ORAL | Status: DC | PRN

## 2016-10-12 MED ORDER — DEXTROSE 5 % IV SOLN
1.0000 g | Freq: Once | INTRAVENOUS | Status: AC
  Administered 2016-10-12: 1 g via INTRAVENOUS
  Filled 2016-10-12: qty 10

## 2016-10-12 MED ORDER — OSELTAMIVIR PHOSPHATE 30 MG PO CAPS
30.0000 mg | ORAL_CAPSULE | Freq: Two times a day (BID) | ORAL | Status: DC
  Administered 2016-10-12 – 2016-10-14 (×4): 30 mg via ORAL
  Filled 2016-10-12 (×4): qty 1

## 2016-10-12 MED ORDER — ENOXAPARIN SODIUM 40 MG/0.4ML ~~LOC~~ SOLN
40.0000 mg | SUBCUTANEOUS | Status: DC
  Administered 2016-10-12 – 2016-10-13 (×2): 40 mg via SUBCUTANEOUS
  Filled 2016-10-12 (×2): qty 0.4

## 2016-10-12 MED ORDER — PREDNISONE 5 MG PO TABS
5.0000 mg | ORAL_TABLET | Freq: Every day | ORAL | Status: DC
  Administered 2016-10-13 – 2016-10-14 (×2): 5 mg via ORAL
  Filled 2016-10-12 (×2): qty 1

## 2016-10-12 MED ORDER — HYDRALAZINE HCL 20 MG/ML IJ SOLN: 5.0000 mg | INTRAMUSCULAR | Status: DC | PRN

## 2016-10-12 MED ORDER — PREDNISONE 2.5 MG PO TABS
2.5000 mg | ORAL_TABLET | Freq: Every day | ORAL | Status: DC
  Administered 2016-10-12 – 2016-10-13 (×2): 2.5 mg via ORAL
  Filled 2016-10-12 (×3): qty 1

## 2016-10-12 MED ORDER — ACETAMINOPHEN 325 MG PO TABS
650.0000 mg | ORAL_TABLET | Freq: Four times a day (QID) | ORAL | Status: DC | PRN
  Administered 2016-10-12: 650 mg via ORAL
  Filled 2016-10-12: qty 2

## 2016-10-12 NOTE — Progress Notes (Signed)
Informed NP on call about pt's. Temp being 103 Tylenol given and new order for a bolus. Bolus started

## 2016-10-12 NOTE — Clinical Social Work Note (Signed)
Clinical Social Work Assessment  Patient Details  Name: [name] [name]. MRN: [id number] Date of Birth: [birth year]  Date of referral:  10/12/16               Reason for consult:  Facility Placement                Permission sought to share information with:  Facility Sport and exercise psychologist, Family Supports Permission granted to share information::  Yes, Verbal Permission Granted  Name::        Agency::     Relationship::     Contact Information:     Housing/Transportation Living arrangements for the past 2 months:    Source of Information:  Adult Children [name] [name]: [phone]) Patient Interpreter Needed:  None Criminal Activity/Legal Involvement Pertinent to Current Situation/Hospitalization:    Significant Relationships:  Adult Children Lives with:  Facility Resident Environmental education officer; Independent Living) Do you feel safe going back to the place where you live?  Yes Need for family participation in patient care:  Yes (Comment)  Care giving concerns:  None given   Social Worker assessment / plan:  CSW met with pt's son and confirmed pt's and pt's son's plan to be discharged back to [facility] to live.  Pt's son desires a possibility of placing pt in [facility] ALF's unit, but states [facility] can facilitate any needed changes.  Pt has been living in [facility] living area prior to being admitted.    Employment status:  Retired Forensic scientist:  Managed Care ([facility]) PT Recommendations:  Not assessed at this time Information / Referral to community resources:  Other (Comment Required) (Pt's family's plan is to return to [facility])  Patient/Family's Response to care:  Patient was asleep during assessment.  Pt's son spoke for the pt.  Patient's son [name] [name] was agreeable to plan.  Pt's son supportive and strongly involved in pt.'s care.  Pt.'s son pleasant and appreciated CSW intervention.     Patient/Family's Understanding  of and Emotional Response to Diagnosis, Current Treatment, and Prognosis: Still assessing     Emotional Assessment Appearance:  Appears younger than stated age Attitude/Demeanor/Rapport:    Affect (typically observed):  Unable to Assess Orientation:  Oriented to Self, Oriented to Place, Oriented to  Time, Oriented to Situation (Oriented per pt's son [name] [name]) Alcohol / Substance use:    Psych involvement (Current and /or in the community):     Discharge Needs  Concerns to be addressed:  Other (Comment Required (None) Readmission within the last 30 days:  No Current discharge risk:  None Barriers to Discharge:  No Barriers Identified   Claudine Mouton, LCSWA 10/12/2016, 5:14 PM

## 2016-10-12 NOTE — H&P (Signed)
History and Physical  [employer]. [id number] DOB: 1923-02-15 DOA: 10/12/2016  Referring physician: Kem Boroughs  PCP: [name], MD   Chief Complaint: fall at home  HPI: [name] [name]. is a 83 y.o. male who presented to ED with son from his ALF with complaints of weakness, confusion, and fall.  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 He was noted to be febrile with temp of 101.  Pt presented with his son who reports that he is residing at an independent living facility and is very sharp when feeling well. Son reports that he speaks with his father every day and last night he started reporting that he was feeling sick, and this morning he told him that he was feeling miserable. Pt had a fall this morning, and was found to have a fever, so he was sent to the ER.  He was noted in ED to have fever of 101 and was tachycardic.  No clear source of infection was found but there is concern for influenza.  He also is noted to be dehydrated and mental status has improved after IVFs.  He also has acute renal failure.  He is being admitted for further management.    Review of Systems: All systems reviewed and apart from history of presenting illness, are negative.  Past Medical History:  Diagnosis Date  . Allergic rhinitis    Prior allergy shots 20 years  . Anemia   . Anemia, B12 deficiency 2000  . Arthritis    Status post left total replacement 8 2011  . BPH (benign prostatic hyperplasia) 2013  . Cancer ([place])    renal cell ca and skin cancer   . Colitis 2014  . Difficult intubation 1994   surgery had to be  stopped due to injury to "throat"  . Difficult intubation 1994   no trouble since.  Required nasotracheal intubation  '02 [facility] / ANESTHESIA RECORD FROM 2013 AND 2016 IN EPIC  . Glaucoma 2007   Status post left trabeculectomy 2007  . History of colon polyps    Colonoscopy 2001  .  History of kidney stones   . History of renal cell carcinoma 1994   Status post right nephrectomy  . Hyperlipidemia 2003  . Hypertension   . 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 Hypothyroidism (acquired) 2000  . Nocturia   . Pituitary mass ([place]) 2000   S/p transphenoidal excision 05/1999 ([facility])  . Vitamin D deficiency 2009   Past Surgical History:  Procedure Laterality Date  . BRAIN SURGERY  2000   pituatary gland removed .  Marland Kitchen CHOLECYSTECTOMY  1984  . Ectropion surgery Bilateral 2006  . EYE SURGERY  over last 6 yrs.     trabeculectomy...   . EYE SURGERY   cat ext ou  . JOINT REPLACEMENT  2011   knee left  . KNEE ARTHROSCOPY Right 02/06/2015   Procedure: RIGHT ARTHROSCOPY KNEE WITH LATERAL MENSICAL  DEBRIDEMENT;  Surgeon: Gaynelle Arabian, MD;  Location: [facility];  Service: Orthopedics;  Laterality: Right;  . Mohs procedure      for skin cancer on nose   . NEPHRECTOMY Right 1994   Renal cell  . REVERSE SHOULDER ARTHROPLASTY  12/15/2011   Procedure: REVERSE SHOULDER ARTHROPLASTY;  Surgeon: Marin Shutter, MD;  Location: [place];  Service: Orthopedics;  Laterality: Right;  right total reverse shoulder  . TONSILLECTOMY    . TOTAL KNEE ARTHROPLASTY Right 06/29/2015   Procedure: TOTAL KNEE ARTHROPLASTY;  Surgeon: Gaynelle Arabian, MD;  Location: [facility];  Service: Orthopedics;  Laterality: Right;  . Transsphenoidal excision pituitary tumor  05/1999   A.Tommi Rumps, M.D.([facility])   Social History:  reports that he quit smoking about 28 years ago. His smoking use included Cigarettes and Pipe. He has never used smokeless tobacco. He reports that he drinks alcohol. He reports that he does not use drugs.   Allergies  Allergen Reactions  . Percocet [Oxycodone-Acetaminophen] Other (See Comments)    Just doesn't like it  . Robaxin [Methocarbamol]   . Diovan [Valsartan] Rash    Family History  Problem Relation Age of Onset  . Lung cancer  Father   . Anesthesia problems Neg Hx   . Hypotension Neg Hx   . Malignant hyperthermia Neg Hx   . Pseudochol deficiency Neg Hx    Prior to Admission medications   Medication Sig Start Date End Date Taking? Authorizing Provider  acetaminophen (TYLENOL) 325 MG tablet Take 2 tablets (650 mg total) by mouth every 6 (six) hours as needed for mild pain or fever. 10/24/15  Yes Bonnielee Haff, MD  Coenzyme Q10 300 MG CAPS Take 1 capsule by mouth daily.   Yes Historical Provider, MD  finasteride (PROSCAR) 5 MG tablet Take 5 mg by mouth every morning.    Yes Historical Provider, MD  levothyroxine (SYNTHROID, LEVOTHROID) 88 MCG tablet Take 88 mcg by mouth daily.   Yes Historical Provider, MD  Multiple Vitamins-Minerals (MULTIVITAMIN & MINERAL PO) Take 1 tablet by mouth daily.   Yes Historical Provider, MD  pravastatin (PRAVACHOL) 80 MG tablet Take 80 mg by mouth every evening.    Yes Historical Provider, MD  predniSONE (DELTASONE) 5 MG tablet Take 2.5-5 mg by mouth 2 (two) times daily. Take 1 tablet (5 mg) in the am and Take 1/2 tablet (5 mg) in the evening.   Yes Historical Provider, MD  testosterone cypionate (DEPO-TESTOSTERONE) 100 MG/ML injection Inject 0.36ml intramuscularly once weekly 10/28/15  Yes Gildardo Cranker, DO  vitamin B-12 (CYANOCOBALAMIN) 1000 MCG tablet Take 1,000 mcg by mouth daily.   Yes Historical Provider, MD  VITAMIN D, ERGOCALCIFEROL, PO Take 2,000 Units by mouth daily.    Yes Historical Provider, MD   Physical Exam: Vitals:   10/12/16 1407 10/12/16 1545 10/12/16 1600 10/12/16 1655  BP:  188/69 170/82   Pulse:   91   Resp:  15 17   Temp:    101.3 F (38.5 C)  TempSrc:    Oral  SpO2:   93%   Weight: 81.6 kg (180 lb)     Height: 5\' 11"  (1.803 m)        General exam: Moderately built and nourished patient, lying comfortably supine on the gurney in no obvious distress.  Head, eyes and ENT: Nontraumatic and normocephalic. Pupils equally reacting to light and accommodation.  Oral mucosa moist.  Neck: Supple. No JVD, carotid bruit or thyromegaly.  Lymphatics: No lymphadenopathy.  Respiratory system: Clear to auscultation. No increased work of breathing.  Cardiovascular system: S1 and S2 heard, RRR. No JVD, murmurs, gallops, clicks or pedal edema.  Gastrointestinal system: Abdomen is nondistended, soft and nontender. Normal bowel sounds heard. No organomegaly or masses appreciated.  Central nervous system: Alert and oriented. No focal neurological deficits. He is oriented to person, place, and time.  moving all 4 extremities   Extremities: Symmetric  5 x 5 power. Peripheral pulses symmetrically felt.   Skin: No rashes. Pt has a skin tear to the R forearm.    Musculoskeletal system: Negative exam.  Psychiatry: Pleasant and cooperative.   Labs on Admission:  Basic Metabolic Panel:  Recent Labs Lab 10/12/16 1529  NA 131*  K 3.7  CL 94*  CO2 29  GLUCOSE 84  BUN 18  CREATININE 1.46*  CALCIUM 8.8*   Liver Function Tests:  Recent Labs Lab 10/12/16 1529  AST 26  ALT 20  ALKPHOS 43  BILITOT 1.3*  PROT 6.5  ALBUMIN 3.6   No results for input(s): LIPASE, AMYLASE in the last 168 hours. No results for input(s): AMMONIA in the last 168 hours. CBC:  Recent Labs Lab 10/12/16 1529  WBC 13.0*  NEUTROABS 8.5*  HGB 13.6  HCT 40.6  MCV 98.3  PLT 187   Cardiac Enzymes: No results for input(s): CKTOTAL, CKMB, CKMBINDEX, TROPONINI in the last 168 hours.  BNP (last 3 results) No results for input(s): PROBNP in the last 8760 hours. CBG: No results for input(s): GLUCAP in the last 168 hours.  Radiological Exams on Admission: Dg Chest 2 View  Result Date: 10/12/2016 CLINICAL DATA:  Fever and confusion EXAM: CHEST  2 VIEW COMPARISON:  11/26/2015 FINDINGS: Mild a basilar atelectasis/ scarring unchanged from the prior study. Negative for pneumonia. Negative for heart failure or effusion. Right shoulder replacement IMPRESSION: Chronic changes the  lung bases.  No superimposed acute abnormality. Electronically Signed   By: Franchot Gallo M.D.   On: 10/12/2016 15:20   Ct Head Wo Contrast  Result Date: 10/12/2016 CLINICAL DATA:  Confusion, fever, weakness, altered mental status EXAM: CT HEAD WITHOUT CONTRAST TECHNIQUE: Contiguous axial images were obtained from the base of the skull through the vertex without intravenous contrast. COMPARISON:  08/26/2015 FINDINGS: Brain: No evidence of acute infarction, hemorrhage, hydrocephalus, extra-axial collection or mass lesion/mass effect. Subcortical white matter and periventricular small vessel ischemic changes. Vascular: Intracranial atherosclerosis. Skull: Normal. Negative for fracture or focal lesion. Sinuses/Orbits: Mild mucosal thickening of the right maxillary sinus. Mastoid air cells are clear. Other: None. IMPRESSION: No evidence of acute intracranial abnormality. Mild small vessel ischemic changes. Electronically Signed   By: Julian Hy M.D.   On: 10/12/2016 15:28    EKG: Independently reviewed. 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   Acute Renal Failure - secondary to dehydration, check orthostatic vitals, supportive IVFs, renally dose meds, recheck BMP in AM.    Generalized weakness - secondary to above, treating supportively, fall precautions recommended.  PT / OT evaluation requested.    Hyponatremia - secondary to dehydration, expect improvement with IVF hydration.  Following BMP.   Fall at home - check orthostatics, supportive therapy as above, fall precautions.    Altered mental status - improved with IVF hydration, neuro checks,  CT head negative for acute findings.    Leukocytosis - secondary to chronic steroids versus acute infection, no evidence found for acute bacterial infection, follow cultures, flu test pending, repeat cxr in AM after hydration.  Following CBC.    Sinus Tachycardia - secondary to febrile illness, improving  with supportive therapy.   Dehydration - treating as above, see orders.    Hypopituitarism - pt takes daily steroids, I see no clinical need to stress dose steroids at this time as his vitals are stable and clinically improving.  Monitor clinically.    DNR - confirmed on admission.    Hypertension - Pt has some elevated blood pressures, will provide hydralazine as needed for really high readings, follow clinically, low dose amlodipine daily.     DVT Prophylaxis: enoxaparin Code Status:  DNR Family Communication: son  Disposition Plan: TBD   Time spent: 17 mins  Irwin Brakeman, MD [facility] Pager [phone]  If 7PM-7AM, please contact night-coverage [email or web address] Password [facility] 10/12/2016, 6:07 PM

## 2016-10-12 NOTE — Progress Notes (Signed)
CRITICAL VALUE ALERT  Critical value received:  Troponin 0.12  Date of notification:  10/12/16  Time of notification:  2000  Critical value read back: Yes  Nurse who received alert:  Jerilee Field RN  MD notified (1st page):  2020  Time of first page:  2020  MD notified (2nd page):  Time of second page:2152  Responding MD:  Chaney Malling NP  Time MD responded:  2204

## 2016-10-12 NOTE — ED Provider Notes (Signed)
[place] DEPT Provider Note   CSN: [id number] Arrival date & time: 10/12/16  1329     History   Chief Complaint Chief Complaint  Patient presents with  . Fatigue  . Fever  . Altered Mental Status  . Fall    HPI [name] Pirl Algenis Frere. is a 83 y.o. male.  HPI  Pt comes in with cc of weakness, confusion, fall. Pt has remote hx of renal CA, s/p resection of the affected kidney, pituitary tumor on chronic low dose prednisone. Pt is here with his son, he is residing at an independent living facility and is very sharp per son. Son reports that he speaks with his father every day, and yday night he started reporting that he was feeling sick, and this morning he told him that he was feeling miserable. Pt had a fall this morning, and was found to have a fever, so he was sent to the ER.   Past Medical History:  Diagnosis Date  . Allergic rhinitis    Prior allergy shots 20 years  . Anemia   . Anemia, B12 deficiency 2000  . Arthritis    Status post left total replacement 8 2011  . BPH (benign prostatic hyperplasia) 2013  . Cancer ([place])    renal cell ca and skin cancer   . Colitis 2014  . Difficult intubation 1994   surgery had to be  stopped due to injury to "throat"  . Difficult intubation 1994   no trouble since.  Required nasotracheal intubation  '02 [facility] / ANESTHESIA RECORD FROM 2013 AND 2016 IN EPIC  . Glaucoma 2007   Status post left trabeculectomy 2007  . History of colon polyps    Colonoscopy 2001  . History of kidney stones   . History of renal cell carcinoma 1994   Status post right nephrectomy  . Hyperlipidemia 2003  . Hypertension   . 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 Hypothyroidism (acquired) 2000  . Nocturia   . Pituitary mass ([place]) 2000   S/p transphenoidal excision 05/1999 ([facility])  . Vitamin D deficiency 2009    Patient Active Problem List   Diagnosis Date Noted  . ARF (acute renal failure)  ([place]) 10/12/2016  . HLD (hyperlipidemia) 04/20/2016  . BPH (benign prostatic hyperplasia) 12/08/2015  . Influenza type B bronchitis 11/30/2015  . Hypotension 11/27/2015  . CKD (chronic kidney disease) stage 3, GFR 30-59 ml/min 11/27/2015  . S/P total knee arthroplasty 07/13/2015  . OA (osteoarthritis) of knee 06/29/2015  . Ectropion 05/06/2015  . Lateral meniscal tear 02/05/2015  . Hyponatremia 10/20/2013  . Abdominal pain 10/20/2013  . Pancreatitis 10/20/2013  . Dehydration 10/20/2013  . Weakness 10/19/2013  . Hypertension   . 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 Hypothyroidism (acquired)   . Anemia, B12 deficiency   . Meibomian gland disease 08/06/2013  . Primary open angle glaucoma 06/19/2013  . Arthritis of shoulder region, right 12/15/2011  . Exposure keratitis 08/17/2011  . Dry eye 07/13/2011    Past Surgical History:  Procedure Laterality Date  . BRAIN SURGERY  2000   pituatary gland removed .  Marland Kitchen CHOLECYSTECTOMY  1984  . Ectropion surgery Bilateral 2006  . EYE SURGERY  over last 6 yrs.     trabeculectomy...   . EYE SURGERY   cat ext ou  . JOINT REPLACEMENT  2011   knee left  . KNEE ARTHROSCOPY Right 02/06/2015   Procedure: RIGHT ARTHROSCOPY  KNEE WITH LATERAL MENSICAL  DEBRIDEMENT;  Surgeon: Gaynelle Arabian, MD;  Location: [facility];  Service: Orthopedics;  Laterality: Right;  . Mohs procedure      for skin cancer on nose   . NEPHRECTOMY Right 1994   Renal cell  . REVERSE SHOULDER ARTHROPLASTY  12/15/2011   Procedure: REVERSE SHOULDER ARTHROPLASTY;  Surgeon: Marin Shutter, MD;  Location: [place];  Service: Orthopedics;  Laterality: Right;  right total reverse shoulder  . TONSILLECTOMY    . TOTAL KNEE ARTHROPLASTY Right 06/29/2015   Procedure: TOTAL KNEE ARTHROPLASTY;  Surgeon: Gaynelle Arabian, MD;  Location: [facility];  Service: Orthopedics;  Laterality: Right;  . Transsphenoidal excision pituitary tumor  05/1999   A.Tommi Rumps, M.D.([facility])       Home  Medications    Prior to Admission medications   Medication Sig Start Date End Date Taking? Authorizing Provider  acetaminophen (TYLENOL) 325 MG tablet Take 2 tablets (650 mg total) by mouth every 6 (six) hours as needed for mild pain or fever. 10/24/15  Yes Bonnielee Haff, MD  Coenzyme Q10 300 MG CAPS Take 1 capsule by mouth daily.   Yes Historical Provider, MD  finasteride (PROSCAR) 5 MG tablet Take 5 mg by mouth every morning.    Yes Historical Provider, MD  levothyroxine (SYNTHROID, LEVOTHROID) 88 MCG tablet Take 88 mcg by mouth daily.   Yes Historical Provider, MD  Multiple Vitamins-Minerals (MULTIVITAMIN & MINERAL PO) Take 1 tablet by mouth daily.   Yes Historical Provider, MD  pravastatin (PRAVACHOL) 80 MG tablet Take 80 mg by mouth every evening.    Yes Historical Provider, MD  predniSONE (DELTASONE) 5 MG tablet Take 2.5-5 mg by mouth 2 (two) times daily. Take 1 tablet (5 mg) in the am and Take 1/2 tablet (5 mg) in the evening.   Yes Historical Provider, MD  testosterone cypionate (DEPO-TESTOSTERONE) 100 MG/ML injection Inject 0.42ml intramuscularly once weekly 10/28/15  Yes Gildardo Cranker, DO  vitamin B-12 (CYANOCOBALAMIN) 1000 MCG tablet Take 1,000 mcg by mouth daily.   Yes Historical Provider, MD  VITAMIN D, ERGOCALCIFEROL, PO Take 2,000 Units by mouth daily.    Yes Historical Provider, MD  ondansetron (ZOFRAN) 4 MG tablet Take 1 tablet (4 mg total) by mouth every 6 (six) hours. Patient not taking: Reported on 10/12/2016 04/05/16   Montine Circle, PA-C    Family History Family History  Problem Relation Age of Onset  . Lung cancer Father   . Anesthesia problems Neg Hx   . Hypotension Neg Hx   . Malignant hyperthermia Neg Hx   . Pseudochol deficiency Neg Hx     Social History Social History  Substance Use Topics  . Smoking status: Former Smoker    Types: Cigarettes, Pipe    Quit date: 09/26/1988  . Smokeless tobacco: Never Used  . Alcohol use Yes     Comment: 1.5-2 ounces of  vodka nightly      Allergies   Percocet [oxycodone-acetaminophen]; Robaxin [methocarbamol]; and Diovan [valsartan]   Review of Systems Review of Systems  ROS 10 Systems reviewed and are negative for acute change except as noted in the HPI.      Physical Exam Updated Vital Signs BP 170/82   Pulse 91   Temp 101.3 F (38.5 C) (Oral)   Resp 17   Ht 5\' 11"  (1.803 m)   Wt 180 lb (81.6 kg)   SpO2 93%   BMI 25.10 kg/m   Physical Exam  Constitutional: He is oriented to person,  place, and time. He appears well-developed.  HENT:  Head: Atraumatic.  Eyes: EOM are normal.  Neck: Neck supple.  Cardiovascular: Normal rate.   Pulmonary/Chest: Effort normal.  Abdominal: Soft. There is no tenderness. There is no guarding.  Neurological: He is oriented to person, place, and time.  Somnolent, moving all 4 extremities  Skin: Skin is warm.  Pt has a skin tear to the R forearm.  Nursing note and vitals reviewed.    ED Treatments / Results  Labs (all labs ordered are listed, but only abnormal results are displayed) Labs Reviewed  COMPREHENSIVE METABOLIC PANEL - Abnormal; Notable for the following:       Result Value   Sodium 131 (*)    Chloride 94 (*)    Creatinine, Ser 1.46 (*)    Calcium 8.8 (*)    Total Bilirubin 1.3 (*)    GFR calc non Af Amer 40 (*)    GFR calc Af Amer 47 (*)    All other components within normal limits  CBC WITH DIFFERENTIAL/PLATELET - Abnormal; Notable for the following:    WBC 13.0 (*)    RBC 4.13 (*)    Neutro Abs 8.5 (*)    Monocytes Absolute 1.4 (*)    All other components within normal limits  URINALYSIS, ROUTINE W REFLEX MICROSCOPIC - Abnormal; Notable for the following:    Color, Urine STRAW (*)    Hgb urine dipstick SMALL (*)    Ketones, ur 5 (*)    All other components within normal limits  CULTURE, BLOOD (ROUTINE X 2)  CULTURE, BLOOD (ROUTINE X 2)  URINE CULTURE  INFLUENZA PANEL BY PCR (TYPE A & B)  I-STAT CG4 LACTIC ACID, ED    I-STAT CG4 LACTIC ACID, ED    EKG  EKG Interpretation  Date/Time:  Wednesday October 12 2016 15:44:20 EST Ventricular Rate:  102 PR Interval:    QRS Duration: 118 QT Interval:  361 QTC Calculation: 471 R Axis:   -72 Text Interpretation:  Sinus tachycardia Left anterior fascicular block Nonspecific T abnrm, anterolateral leads ST elevation, consider inferior injury No acute changes Confirmed by Kathrynn Humble, MD, Thelma Comp [phone]) on 10/12/2016 3:55:16 PM       Radiology Dg Chest 2 View  Result Date: 10/12/2016 CLINICAL DATA:  Fever and confusion EXAM: CHEST  2 VIEW COMPARISON:  11/26/2015 FINDINGS: Mild a basilar atelectasis/ scarring unchanged from the prior study. Negative for pneumonia. Negative for heart failure or effusion. Right shoulder replacement IMPRESSION: Chronic changes the lung bases.  No superimposed acute abnormality. Electronically Signed   By: Franchot Gallo M.D.   On: 10/12/2016 15:20   Ct Head Wo Contrast  Result Date: 10/12/2016 CLINICAL DATA:  Confusion, fever, weakness, altered mental status EXAM: CT HEAD WITHOUT CONTRAST TECHNIQUE: Contiguous axial images were obtained from the base of the skull through the vertex without intravenous contrast. COMPARISON:  08/26/2015 FINDINGS: Brain: No evidence of acute infarction, hemorrhage, hydrocephalus, extra-axial collection or mass lesion/mass effect. Subcortical white matter and periventricular small vessel ischemic changes. Vascular: Intracranial atherosclerosis. Skull: Normal. Negative for fracture or focal lesion. Sinuses/Orbits: Mild mucosal thickening of the right maxillary sinus. Mastoid air cells are clear. Other: None. IMPRESSION: No evidence of acute intracranial abnormality. Mild small vessel ischemic changes. Electronically Signed   By: Julian Hy M.D.   On: 10/12/2016 15:28    Procedures Procedures (including critical care time)  Medications Ordered in ED Medications  sodium chloride 0.9 % bolus 1,000 mL  (not administered)  sodium chloride 0.9 % bolus 1,000 mL (0 mLs Intravenous Stopped 10/12/16 1720)  cefTRIAXone (ROCEPHIN) 1 g in dextrose 5 % 50 mL IVPB (1 g Intravenous New Bag/Given 10/12/16 1722)     Initial Impression / Assessment and Plan / ED Course  I have reviewed the triage vital signs and the nursing notes.  Pertinent labs & imaging results that were available during my care of the patient were reviewed by me and considered in my medical decision making (see chart for details).     DDx includes: ICH / Stroke ACS Sepsis syndrome Infection - UTI/Pneumonia/influenza Encephalopathy  Electrolyte abnormality Drug overdose Metabolic disorders including thyroid disorders, adrenal insufficiency Cancer of unknown origin / paraneoplastic process  Pt comes in with cc of fevers, confusion, fall. Pt is on chronic prednisone, low dose. He is somnolent on exam - but oriented x 3. Hx or exam not suggestive of a clear source of infection.  We will get full sepsis workup going. CT head ordered as pt had a fall, he is elderly.  5:59 PM Workup is neg. There is a fever, and elevated WC.  Pt's Cr is slightly elevated - however, there is no true end organ damage.  Pt will get a dose of ceftriaxone. He is not at baseline mentation. Pt has no meningismus. I doubt he has meningitis / encephalitis - but we will add acyclovir and give another gram of ceftriaxone to get him to 2 grams total. Flu is pending.   Final Clinical Impressions(s) / ED Diagnoses   Final diagnoses:  Generalized weakness  Sepsis, due to unspecified organism [facility])    New Prescriptions New Prescriptions   No medications on file     Varney Biles, MD 10/12/16 1801

## 2016-10-12 NOTE — Progress Notes (Signed)
The nursing supervisor from [facility] called to inform us that the pt. Tested positive for the flu 2 days ago.

## 2016-10-12 NOTE — ED Notes (Signed)
Bed: [facility] Expected date:  Expected time:  Means of arrival:  Comments: 

## 2016-10-12 NOTE — ED Triage Notes (Signed)
Per EMS pt from [facility], initial call was for fall this morning (abrasions noted to wrists/ forearms), however upon their arrival they were notified pt was weak, febrile with temp of 102, and confused. Per EMS pt is A+Ox3 with temp 99.9, hr 90, b/p 180/80 O2 sats 95 on RA and CBG 85

## 2016-10-13 ENCOUNTER — Inpatient Hospital Stay (HOSPITAL_COMMUNITY): Payer: PPO

## 2016-10-13 DIAGNOSIS — W19XXXD Unspecified fall, subsequent encounter: Secondary | ICD-10-CM

## 2016-10-13 DIAGNOSIS — R7989 Other specified abnormal findings of blood chemistry: Secondary | ICD-10-CM

## 2016-10-13 DIAGNOSIS — R778 Other specified abnormalities of plasma proteins: Secondary | ICD-10-CM

## 2016-10-13 DIAGNOSIS — R5081 Fever presenting with conditions classified elsewhere: Secondary | ICD-10-CM

## 2016-10-13 DIAGNOSIS — E43 Unspecified severe protein-calorie malnutrition: Secondary | ICD-10-CM | POA: Insufficient documentation

## 2016-10-13 LAB — ECHOCARDIOGRAM COMPLETE
HEIGHTINCHES: 71 in
WEIGHTICAEL: 2939.2 [oz_av]

## 2016-10-13 LAB — CBC
HEMATOCRIT: 36.4 % — AB (ref 39.0–52.0)
HEMOGLOBIN: 12 g/dL — AB (ref 13.0–17.0)
MCH: 32.4 pg (ref 26.0–34.0)
MCHC: 33 g/dL (ref 30.0–36.0)
MCV: 98.4 fL (ref 78.0–100.0)
Platelets: 159 10*3/uL (ref 150–400)
RBC: 3.7 MIL/uL — AB (ref 4.22–5.81)
RDW: 15.6 % — ABNORMAL HIGH (ref 11.5–15.5)
WBC: 12 10*3/uL — ABNORMAL HIGH (ref 4.0–10.5)

## 2016-10-13 LAB — BASIC METABOLIC PANEL
Anion gap: 6 (ref 5–15)
BUN: 23 mg/dL — AB (ref 6–20)
CHLORIDE: 102 mmol/L (ref 101–111)
CO2: 25 mmol/L (ref 22–32)
CREATININE: 1.46 mg/dL — AB (ref 0.61–1.24)
Calcium: 7.7 mg/dL — ABNORMAL LOW (ref 8.9–10.3)
GFR calc non Af Amer: 40 mL/min — ABNORMAL LOW (ref >60)
GFR, EST AFRICAN AMERICAN: 47 mL/min — AB (ref >60)
Glucose, Bld: 77 mg/dL (ref 65–99)
POTASSIUM: 3.7 mmol/L (ref 3.5–5.1)
Sodium: 133 mmol/L — ABNORMAL LOW (ref 135–145)

## 2016-10-13 LAB — TROPONIN I
TROPONIN I: 0.16 ng/mL — AB (ref <0.03)
TROPONIN I: 0.18 ng/mL — AB (ref <0.03)

## 2016-10-13 LAB — PHOSPHORUS: Phosphorus: 4 mg/dL (ref 2.5–4.6)

## 2016-10-13 LAB — T4, FREE: Free T4: 1.29 ng/dL — ABNORMAL HIGH (ref 0.61–1.12)

## 2016-10-13 LAB — MAGNESIUM: MAGNESIUM: 1.8 mg/dL (ref 1.7–2.4)

## 2016-10-13 MED ORDER — ENSURE ENLIVE PO LIQD
237.0000 mL | Freq: Two times a day (BID) | ORAL | Status: DC
  Administered 2016-10-13 – 2016-10-14 (×2): 237 mL via ORAL

## 2016-10-13 MED ORDER — LEVOTHYROXINE SODIUM 75 MCG PO TABS
75.0000 ug | ORAL_TABLET | Freq: Every day | ORAL | Status: DC
  Administered 2016-10-14: 75 ug via ORAL
  Filled 2016-10-13: qty 1

## 2016-10-13 NOTE — Progress Notes (Signed)
LCSWA met with patient at bedside, discussed PT recommendations for rehab. Patient is a resident at [facility]. Patient will return to [facility] when medically stable.  Will continue to follow and assist with patient disposition.

## 2016-10-13 NOTE — Evaluation (Signed)
Physical Therapy Evaluation Patient Details Name: [name] [name]. MRN: [id number] DOB: 1923-03-27 Today's Date: 10/13/2016   History of Present Illness  83 y.o. male who presented to ED with son from his ALF with complaints of weakness, confusion, and fall.  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 Dx of ARF, flu.   Clinical Impression  Pt admitted with above diagnosis. Pt currently with functional limitations due to the deficits listed below (see PT Problem List). Min hand held assist for bed to recliner, activity tolerance limited by fatigue.  Pt will benefit from skilled PT to increase their independence and safety with mobility to allow discharge to the venue listed below.       Follow Up Recommendations SNF;Supervision for mobility/OOB    Equipment Recommendations  None recommended by PT    Recommendations for Other Services       Precautions / Restrictions Precautions Precautions: Fall Precaution Comments: per chart pt had a recent fall, pt denies this Restrictions Weight Bearing Restrictions: No      Mobility  Bed Mobility Overal bed mobility: Modified Independent             General bed mobility comments: with bedrail, HOB up 30*  Transfers Overall transfer level: Needs assistance Equipment used: 1 person hand held assist Transfers: Sit to/from [facility] Sit to Stand: Min assist Stand pivot transfers: Min assist       General transfer comment: hand held assist of 1 for sit to stand and to pivot to recliner, pt declined ambulation 2* fatigue  Ambulation/Gait                Stairs            Wheelchair Mobility    Modified Rankin (Stroke Patients Only)       Balance Overall balance assessment: Needs assistance   Sitting balance-Leahy Scale: Good     Standing balance support: Single extremity supported Standing balance-Leahy  Scale: Fair Standing balance comment: single UE support for dynamic standing balance                             Pertinent Vitals/Pain Pain Assessment: No/denies pain    Home Living Family/patient expects to be discharged to:: Other (Comment)                 Additional Comments: from [facility], has handicapped height comode, walk in shower with grab bars    Prior Function Level of Independence: Independent with assistive device(s)         Comments: walks with cane     Hand Dominance   Dominant Hand: Right    Extremity/Trunk Assessment   Upper Extremity Assessment Upper Extremity Assessment: Overall WFL for tasks assessed    Lower Extremity Assessment Lower Extremity Assessment: Overall WFL for tasks assessed (knee ext 5/5)    Cervical / Trunk Assessment Cervical / Trunk Assessment: Normal  Communication   Communication: No difficulties  Cognition Arousal/Alertness: Awake/alert Behavior During Therapy: WFL for tasks assessed/performed Overall Cognitive Status: No family/caregiver present to determine baseline cognitive functioning (possible short term memory difficulties, pt denies recent fall but chart states he fell just prior to admission)                      General Comments      Exercises  Assessment/Plan    PT Assessment Patient needs continued PT services  PT Problem List Decreased activity tolerance;Decreased balance;Decreased mobility          PT Treatment Interventions Gait training;Functional mobility training;Balance training;Therapeutic exercise;Patient/family education;Therapeutic activities    PT Goals (Current goals can be found in the Care Plan section)  Acute Rehab PT Goals Patient Stated Goal: return to independence with walking with cane PT Goal Formulation: With patient Time For Goal Achievement: 10/27/16 Potential to Achieve Goals: Good    Frequency Min 3X/week   Barriers to discharge         Co-evaluation               End of Session     Patient left: in chair;with call bell/phone within reach;with chair alarm set Nurse Communication: Mobility status         Time: [id number] PT Time Calculation (min) (ACUTE ONLY): 44 min   Charges:   PT Evaluation $PT Eval Low Complexity: 1 Procedure PT Treatments $Therapeutic Activity: 23-37 mins   PT G Codes:        Philomena Doheny 10/13/2016, 1:16 PM [phone]

## 2016-10-13 NOTE — Progress Notes (Signed)
Initial Nutrition Assessment  DOCUMENTATION CODES:   Severe malnutrition in context of acute illness/injury  INTERVENTION:   Ensure Enlive po BID, each supplement provides 350 kcal and 20 grams of protein  NUTRITION DIAGNOSIS:   Malnutrition related to acute illness, poor appetite as evidenced by moderate depletion of body fat, moderate depletions of muscle mass, energy intake < or equal to 50% for > or equal to 5 days.  GOAL:   Patient will meet greater than or equal to 90% of their needs  MONITOR:   PO intake, Supplement acceptance, Labs, I & O's  REASON FOR ASSESSMENT:   Consult Assessment of nutrition requirement/status  ASSESSMENT:   83 y.o. male who presented to ED with son from his ALF with complaints of weakness, confusion, and fall.  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 Admitted for flu   Met with pt in room today. Pt reports feeling miserable with poor appetite and oral intake. Pt reports that he has really not eaten well in a week. Pt denies nausea or vomiting but reports that he just doesn't have an appetite. RD weighed pt in room today at 183.7lbs. Per chart, it appears patient has gained weight over the past year. Pt does have mild edema. Pt does have moderate muscle wasting in her clavicles and chest. RD will order Ensure. Pt on long term, low dose, steroids and has history or renal cancer and pituitary tumor. Pt with hyponatremia likely secondary to dehydration per MD note.   Medications reviewed and include: lovenox, synthroid, prednisone, Vit B-12   Labs reviewed: Na 133(L), BUN 23(H), creat 1.46(H), Ca 7.7(L) Wbc- 12(H)  Nutrition-Focused physical exam completed. Findings are moderate fat depletion in chest, moderate muscle depletion in clavicles, and mild edema in bilateral lower extremeties.   Diet Order:  Diet Heart Room service appropriate? Yes; Fluid consistency:  Thin  Skin:  Reviewed, no issues  Last BM:  none since admit  Height:   Ht Readings from Last 1 Encounters:  10/12/16 _0  (1.803 m)    Weight:   Wt Readings from Last 1 Encounters:  10/13/16 183 lb 11.2 oz (83.3 kg)    Ideal Body Weight:  78.1 kg  BMI:  Body mass index is 25.62 kg/m.  Estimated Nutritional Needs:   Kcal:  1950-2250kcal/day   Protein:  90-106g/day   Fluid:  >2L/day   EDUCATION NEEDS:   No education needs identified at this time  Koleen Distance, RD, LDN Pager #[phone]

## 2016-10-13 NOTE — Progress Notes (Signed)
  Echocardiogram 2D Echocardiogram has been performed.  [name] [name] 10/13/2016, 3:12 PM

## 2016-10-13 NOTE — Progress Notes (Addendum)
PROGRESS NOTE    [name] [name].  [id number]  DOB: 1922/12/04  DOA: 10/12/2016 PCP: [name], MD  Hospital course:  [name] Ebel. is a 83 y.o. male who presented to ED with son from his ALF with complaints of weakness, confusion, and fall.  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 He was noted to be febrile with temp of 101.  Pt presented with his son who reports that he is residing at an independent living facility and is very sharp when feeling well. Son reports that he speaks with his father every day and last night he started reporting that he was feeling sick, and this morning he told him that he was feeling miserable. Pt had a fall this morning, and was found to have a fever, so he was sent to the ER.  He was noted in ED to have fever of 101 and was tachycardic.  No clear source of infection was found but there is concern for influenza.  He also is noted to be dehydrated and mental status has improved after IVFs.  He also has acute renal failure.  He is being admitted for further management.    Assessment & Plan:   Influenza A - Continue tamiflu 30 mg po BID, treat symptomatically, tylenol as needed for fever.  Droplet precautions.   Acute Renal Failure - secondary to dehydration, check orthostatic vitals, continue supportive IVFs, renally dose meds, recheck BMP in AM.    Generalized weakness - secondary to above, treating supportively, fall precautions recommended.  PT / OT evaluation requested.    Elevated troponins possible NSTEMI - ask for cardiology to evaluate as the stress of the flu may have caused an MI.    Hyponatremia - secondary to dehydration, slight improvement with IVF hydration.  Following BMP.   Fall at home - check orthostatics, supportive therapy as above, fall precautions.  PT evaluation pending.   Acute confusion/delirium -resolved now. CT head negative  for acute findings.    Leukocytosis - secondary to chronic steroids versus acute infection, no evidence found for acute bacterial infection, follow cultures, flu test pending, repeat cxr: no pneumonia seen.  Following CBC.    Sinus Tachycardia - secondary to febrile illness, improving with supportive therapy.   Dehydration - treating as above, see orders.    Hypopituitarism - pt takes daily steroids, I see no clinical need to stress dose steroids at this time as his vitals are stable and clinically improving.  Monitor clinically.    Hyperthyroidism - Check free T4, reduce dose of levothyroxine.   DNR - confirmed on admission.    Hypertension - Pt has some elevated blood pressures, will provide hydralazine as needed for really high readings, follow clinically, low dose amlodipine daily.     DVT Prophylaxis: enoxaparin Code Status:  DNR Family Communication: son  Disposition Plan: TBD   Subjective: Pt says that he feels terrible.  Denies chest pain at this time.    Objective: Vitals:   10/12/16 2107 10/12/16 2258 10/13/16 0103 10/13/16 0507  BP: (!) 172/74   (!) 111/40  Pulse: (!) 106   79  Resp: 17   20  Temp: (!) 103 F (39.4 C) (!) 101.9 F (38.8 C) 98.4 F (36.9 C) 98.5 F (36.9 C)  TempSrc: Oral   Oral  SpO2: 95%   94%  Weight:      Height:  Intake/Output Summary (Last 24 hours) at 10/13/16 0925 Last data filed at 10/13/16 0600  Gross per 24 hour  Intake          1781.25 ml  Output                0 ml  Net          1781.25 ml   Filed Weights   10/12/16 1407  Weight: 81.6 kg (180 lb)    Exam:  General exam: ill appearing male, round facies from chronic steroids, NAD Respiratory system:  No increased work of breathing. Cardiovascular system: S1 & S2 heard, tachycardic.   Gastrointestinal system: Abdomen is nondistended, soft and nontender. Normal bowel sounds heard. Central nervous system: Alert and oriented. No focal neurological  deficits. Extremities: no cyanosis.  Data Reviewed: Basic Metabolic Panel:  Recent Labs Lab 10/12/16 1529 10/12/16 1916 10/13/16 0528  NA 131*  --  133*  K 3.7  --  3.7  CL 94*  --  102  CO2 29  --  25  GLUCOSE 84  --  77  BUN 18  --  23*  CREATININE 1.46* 1.37* 1.46*  CALCIUM 8.8*  --  7.7*  MG  --   --  1.8  PHOS  --   --  4.0   Liver Function Tests:  Recent Labs Lab 10/12/16 1529  AST 26  ALT 20  ALKPHOS 43  BILITOT 1.3*  PROT 6.5  ALBUMIN 3.6   No results for input(s): LIPASE, AMYLASE in the last 168 hours. No results for input(s): AMMONIA in the last 168 hours. CBC:  Recent Labs Lab 10/12/16 1529 10/12/16 1916 10/13/16 0528  WBC 13.0* 12.7* 12.0*  NEUTROABS 8.5*  --   --   HGB 13.6 13.4 12.0*  HCT 40.6 39.9 36.4*  MCV 98.3 97.8 98.4  PLT 187 167 159   Cardiac Enzymes:  Recent Labs Lab 10/12/16 1916 10/13/16 0013 10/13/16 0817  TROPONINI 0.12* 0.18* 0.16*   CBG (last 3)  No results for input(s): GLUCAP in the last 72 hours. No results found for this or any previous visit (from the past 240 hour(s)).   Studies: Dg Chest 2 View  Result Date: 10/12/2016 CLINICAL DATA:  Fever and confusion EXAM: CHEST  2 VIEW COMPARISON:  11/26/2015 FINDINGS: Mild a basilar atelectasis/ scarring unchanged from the prior study. Negative for pneumonia. Negative for heart failure or effusion. Right shoulder replacement IMPRESSION: Chronic changes the lung bases.  No superimposed acute abnormality. Electronically Signed   By: Franchot Gallo M.D.   On: 10/12/2016 15:20   Ct Head Wo Contrast  Result Date: 10/12/2016 CLINICAL DATA:  Confusion, fever, weakness, altered mental status EXAM: CT HEAD WITHOUT CONTRAST TECHNIQUE: Contiguous axial images were obtained from the base of the skull through the vertex without intravenous contrast. COMPARISON:  08/26/2015 FINDINGS: Brain: No evidence of acute infarction, hemorrhage, hydrocephalus, extra-axial collection or mass  lesion/mass effect. Subcortical white matter and periventricular small vessel ischemic changes. Vascular: Intracranial atherosclerosis. Skull: Normal. Negative for fracture or focal lesion. Sinuses/Orbits: Mild mucosal thickening of the right maxillary sinus. Mastoid air cells are clear. Other: None. IMPRESSION: No evidence of acute intracranial abnormality. Mild small vessel ischemic changes. Electronically Signed   By: Julian Hy M.D.   On: 10/12/2016 15:28   Dg Chest Port 1 View  Result Date: 10/13/2016 CLINICAL DATA:  Fever EXAM: PORTABLE CHEST 1 VIEW COMPARISON:  October 12, 2016 and November 26, 2015 FINDINGS: There is stable  scarring in the left base region. There is a questionable small pleural effusion on the left as well. Lungs elsewhere are clear. Heart is borderline enlarged with pulmonary vascularity within normal limits. No adenopathy. There is atherosclerotic calcification in the aorta. Patient is status post total shoulder replacement on the right. IMPRESSION: Scarring left base with probable small left pleural effusion. Lungs elsewhere clear. Stable cardiac prominence. There is aortic atherosclerosis. Electronically Signed   By: Lowella Grip III M.D.   On: 10/13/2016 07:07   Scheduled Meds: . amLODipine  5 mg Oral Daily  . enoxaparin (LOVENOX) injection  40 mg Subcutaneous Q24H  . finasteride  5 mg Oral q morning - 10a  . levothyroxine  88 mcg Oral QAC breakfast  . oseltamivir  30 mg Oral BID  . pravastatin  80 mg Oral QPM  . predniSONE  2.5 mg Oral Q2000  . predniSONE  5 mg Oral Q breakfast  . vitamin B-12  1,000 mcg Oral Daily   Continuous Infusions: . 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

## 2016-10-13 NOTE — Consult Note (Signed)
The patient has been seen in conjunction with Harlan Stains, NP-C. All aspects of care have been considered and discussed. The patient has been personally interviewed, examined, and all clinical data has been reviewed.   All data has been reviewed. Acute coronary syndrome appears very unlikely. Suspect troponin elevation is multifactorial related to demand of acute illness and decreased kidney function.   Occasionally, influenza virus has been associated with myocardial depression. 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  Management at this time is supportive. Plan no invasive evaluation.  An echocardiogram has been ordered to exclude the possibility of LV dysfunction.   Cardiology Consult    Patient ID: [name] [name]. MRN: [id number], DOB/AGE: [birth year]   Admit date: 10/12/2016 Date of Consult: 10/13/2016  Primary Physician: [name], MD Primary Cardiologist: New Requesting Provider: Dr. Loleta Books Reason for Consultation: Elevated Trop  Patient Profile    83 yo male with PMH of HTN, HL, hypopituitarism, renal CA who presented after a fall, flu + and dehydrated with positive trop.   Past Medical History   Past Medical History:  Diagnosis Date  . Allergic rhinitis    Prior allergy shots 20 years  . Anemia   . Anemia, B12 deficiency 2000  . Arthritis    Status post left total replacement 8 2011  . BPH (benign prostatic hyperplasia) 2013  . Cancer ([place])    renal cell ca and skin cancer   . Colitis 2014  . Difficult intubation 1994   surgery had to be  stopped due to injury to "throat"  . Difficult intubation 1994   no trouble since.  Required nasotracheal intubation  '02 [facility] / ANESTHESIA RECORD FROM 2013 AND 2016 IN EPIC  . Glaucoma 2007   Status post left trabeculectomy 2007  . History of colon polyps    Colonoscopy 2001  . History of kidney stones   . History of renal cell carcinoma 1994   Status post right nephrectomy  . Hyperlipidemia 2003  . Hypertension   . 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 Hypothyroidism (acquired) 2000  . Nocturia   . Pituitary mass ([place]) 2000   S/p transphenoidal excision 05/1999 ([facility])  . Vitamin D deficiency 2009    Past Surgical History:  Procedure Laterality Date  . BRAIN SURGERY  2000   pituatary gland removed .  Marland Kitchen CHOLECYSTECTOMY  1984  . Ectropion surgery Bilateral 2006  . EYE SURGERY  over last 6 yrs.     trabeculectomy...   . EYE SURGERY   cat ext ou  . JOINT REPLACEMENT  2011   knee left  . KNEE ARTHROSCOPY Right 02/06/2015   Procedure: RIGHT ARTHROSCOPY KNEE WITH LATERAL MENSICAL  DEBRIDEMENT;  Surgeon: Gaynelle Arabian, MD;  Location: [facility];  Service: Orthopedics;  Laterality: Right;  . Mohs procedure      for skin cancer on nose   . NEPHRECTOMY Right 1994   Renal cell  . REVERSE SHOULDER ARTHROPLASTY  12/15/2011   Procedure: REVERSE SHOULDER ARTHROPLASTY;  Surgeon: Marin Shutter, MD;  Location: [facility];  Service: Orthopedics;  Laterality: Right;  right total reverse shoulder  . TONSILLECTOMY    . TOTAL KNEE ARTHROPLASTY Right 06/29/2015   Procedure: TOTAL KNEE ARTHROPLASTY;  Surgeon: Gaynelle Arabian, MD;  Location: [facility];  Service: Orthopedics;  Laterality: Right;  . Transsphenoidal excision pituitary tumor  05/1999   A.Tommi Rumps, M.D.([facility])  Allergies  Allergies  Allergen Reactions  . Percocet [Oxycodone-Acetaminophen] Other (See Comments)    Just doesn't like it  . Robaxin [Methocarbamol]   . Diovan [Valsartan] Rash    History of Present Illness    [name] [name] is a 83 yo male with PMH of HTN, HL, hypopituitarism, renal CA. Denies any personal or family hx of CAD. Currently lives at [facility] in the independent living area and cares for himself. Goes to the gym on MWF and works out. Denies any exertional angina or dyspnea with this activity.   He was dx with  influenza A to days prior to admission. On 10/12/16 he had an unwitnessed fall, along with worsening generalized weakness and altered mental status.   In the ED he was febrile, and tachycardiac. Labs showed Na+ 131, Cr 1.46, Trop 0.12>>0.18>>0.16, WBC 13, Hgb 12. CXR with small left pleural effusion, but otherwise clear. CT head neg. EKG showed SR with RBBB, LAFB noted on previous tracings. He was started on IV fluids and admitted.   Inpatient Medications    . amLODipine  5 mg Oral Daily  . enoxaparin (LOVENOX) injection  40 mg Subcutaneous Q24H  . finasteride  5 mg Oral q morning - 10a  . [START ON 10/14/2016] levothyroxine  75 mcg Oral QAC breakfast  . oseltamivir  30 mg Oral BID  . pravastatin  80 mg Oral QPM  . predniSONE  2.5 mg Oral Q2000  . predniSONE  5 mg Oral Q breakfast  . vitamin B-12  1,000 mcg Oral Daily    Family History    Family History  Problem Relation Age of Onset  . Lung cancer Father   . Anesthesia problems Neg Hx   . Hypotension Neg Hx   . Malignant hyperthermia Neg Hx   . Pseudochol deficiency Neg Hx     Social History    Social History   Social History  . Marital status: Widowed    Spouse name: N/A  . Number of children: N/A  . Years of education: N/A   Occupational History  . Not on file.   Social History Main Topics  . Smoking status: Former Smoker    Types: Cigarettes, Pipe    Quit date: 09/26/1988  . Smokeless tobacco: Never Used  . Alcohol use Yes     Comment: 1.5-2 ounces of vodka nightly   . Drug use: No  . Sexual activity: Not on file   Other Topics Concern  . Not on file   Social History Narrative   Patient is Married Software engineer). Retired Engineer, mining) Administrator, sports. Lives in apartment, Independent Living section at [facility] since 2000.  Spouse lives in skilled nursing section in same community   Smoking 1994 , moderate alcohol use: 2 drinks a night. Exercises regularly with walking and exercise  classes. Continues to drive   Patient has Advanced planning documents: Living Will                 Review of Systems    General:  No chills, ++ fever, no night sweats or weight changes.  Cardiovascular:  No chest pain, dyspnea on exertion, edema, orthopnea, palpitations, paroxysmal nocturnal dyspnea. Dermatological: No rash, lesions/masses Respiratory: + cough, dyspnea Urologic: No hematuria, dysuria Abdominal:   ++ nausea, vomiting, diarrhea, bright red blood per rectum, melena, or hematemesis Neurologic: See HPI All other systems reviewed and are otherwise negative except as noted above.  Physical Exam    Blood pressure (!) 111/40,  pulse 79, temperature 98.5 F (36.9 C), temperature source Oral, resp. rate 20, height 5\' 11"  (1.803 m), weight 180 lb (81.6 kg), SpO2 94 %.  General: Pleasant older WM, NAD Psych: Normal affect. Neuro: Alert and oriented X 3. Moves all extremities spontaneously. HEENT: Normal  Neck: Supple without bruits or JVD. Lungs:  Resp regular and unlabored, CTA. Heart: RRR no s3, s4, or murmurs. Abdomen: Soft, non-tender, non-distended, BS + x 4.  Extremities: No clubbing, cyanosis or edema. DP/PT/Radials 2+ and equal bilaterally. Rubor on  LE bilaterally.  Labs    Troponin (Point of Care Test) No results for input(s): TROPIPOC in the last 72 hours.  Recent Labs  10/12/16 1916 10/13/16 0013 10/13/16 0817  TROPONINI 0.12* 0.18* 0.16*   Lab Results  Component Value Date   WBC 12.0 (H) 10/13/2016   HGB 12.0 (L) 10/13/2016   HCT 36.4 (L) 10/13/2016   MCV 98.4 10/13/2016   PLT 159 10/13/2016    Recent Labs Lab 10/12/16 1529  10/13/16 0528  NA 131*  --  133*  K 3.7  --  3.7  CL 94*  --  102  CO2 29  --  25  BUN 18  --  23*  CREATININE 1.46*  < > 1.46*  CALCIUM 8.8*  --  7.7*  PROT 6.5  --   --   BILITOT 1.3*  --   --   ALKPHOS 43  --   --   ALT 20  --   --   AST 26  --   --   GLUCOSE 84  --  77  < > = values in this interval not  displayed. No results found for: CHOL, HDL, LDLCALC, TRIG No results found for: DDIMER   Radiology Studies    Dg Chest 2 View  Result Date: 10/12/2016 CLINICAL DATA:  Fever and confusion EXAM: CHEST  2 VIEW COMPARISON:  11/26/2015 FINDINGS: Mild a basilar atelectasis/ scarring unchanged from the prior study. Negative for pneumonia. Negative for heart failure or effusion. Right shoulder replacement IMPRESSION: Chronic changes the lung bases.  No superimposed acute abnormality. Electronically Signed   By: Franchot Gallo M.D.   On: 10/12/2016 15:20   Ct Head Wo Contrast  Result Date: 10/12/2016 CLINICAL DATA:  Confusion, fever, weakness, altered mental status EXAM: CT HEAD WITHOUT CONTRAST TECHNIQUE: Contiguous axial images were obtained from the base of the skull through the vertex without intravenous contrast. COMPARISON:  08/26/2015 FINDINGS: Brain: No evidence of acute infarction, hemorrhage, hydrocephalus, extra-axial collection or mass lesion/mass effect. Subcortical white matter and periventricular small vessel ischemic changes. Vascular: Intracranial atherosclerosis. Skull: Normal. Negative for fracture or focal lesion. Sinuses/Orbits: Mild mucosal thickening of the right maxillary sinus. Mastoid air cells are clear. Other: None. IMPRESSION: No evidence of acute intracranial abnormality. Mild small vessel ischemic changes. Electronically Signed   By: Julian Hy M.D.   On: 10/12/2016 15:28   Dg Chest Port 1 View  Result Date: 10/13/2016 CLINICAL DATA:  Fever EXAM: PORTABLE CHEST 1 VIEW COMPARISON:  October 12, 2016 and November 26, 2015 FINDINGS: There is stable scarring in the left base region. There is a questionable small pleural effusion on the left as well. Lungs elsewhere are clear. Heart is borderline enlarged with pulmonary vascularity within normal limits. No adenopathy. There is atherosclerotic calcification in the aorta. Patient is status post total shoulder replacement on the  right. IMPRESSION: Scarring left base with probable small left pleural effusion. Lungs elsewhere clear. Stable cardiac prominence.  There is aortic atherosclerosis. Electronically Signed   By: Lowella Grip III M.D.   On: 10/13/2016 07:07    ECG & Cardiac Imaging    EKG: SR with RBBB, LAFB  Echo: None  Assessment & Plan    83 yo male with PMH of HTN, HL, hypopituitarism, renal CA who presented after a fall, flu + and dehydrated with positive trop  1. Elevate Trop: Flat non ACS trend noted. Likely demand ischemia 2/2 acute illness and tachycardia. Also noted in the setting of elevated Cr. EKG nonacute. Denies any anginal symptoms prior to admission, and is actually a very active 83 yo.  -- will check echo for LV function, and WMA  2. Fever/generalized weakness: Flu +, currently on tamiflu, WBC elevated but lactic acid neg.   3. AKI: Cr elevated on admission, currently on IVF  4. Leukocytosis: On chronic steroids for hypopituitarism, lactic acid normal  5. HTN: elevated on admission, but now improved. On norvasc  Weston Brass Reino Bellis, NP-C Pager [phone] 10/13/2016, 11:22 AM

## 2016-10-13 NOTE — Progress Notes (Signed)
Paged the NP to let her know that the pt's. Troponin went up to 0.18 lab did not call with the critical value.

## 2016-10-14 DIAGNOSIS — E43 Unspecified severe protein-calorie malnutrition: Secondary | ICD-10-CM

## 2016-10-14 DIAGNOSIS — I10 Essential (primary) hypertension: Secondary | ICD-10-CM

## 2016-10-14 DIAGNOSIS — R748 Abnormal levels of other serum enzymes: Secondary | ICD-10-CM

## 2016-10-14 DIAGNOSIS — N170 Acute kidney failure with tubular necrosis: Secondary | ICD-10-CM

## 2016-10-14 LAB — BASIC METABOLIC PANEL
Anion gap: 6 (ref 5–15)
BUN: 23 mg/dL — AB (ref 6–20)
CALCIUM: 8.1 mg/dL — AB (ref 8.9–10.3)
CHLORIDE: 105 mmol/L (ref 101–111)
CO2: 27 mmol/L (ref 22–32)
CREATININE: 1.29 mg/dL — AB (ref 0.61–1.24)
GFR calc Af Amer: 54 mL/min — ABNORMAL LOW (ref >60)
GFR, EST NON AFRICAN AMERICAN: 47 mL/min — AB (ref >60)
Glucose, Bld: 162 mg/dL — ABNORMAL HIGH (ref 65–99)
Potassium: 3.7 mmol/L (ref 3.5–5.1)
SODIUM: 138 mmol/L (ref 135–145)

## 2016-10-14 LAB — CBC
HCT: 35.5 % — ABNORMAL LOW (ref 39.0–52.0)
HEMOGLOBIN: 11.7 g/dL — AB (ref 13.0–17.0)
MCH: 31.7 pg (ref 26.0–34.0)
MCHC: 33 g/dL (ref 30.0–36.0)
MCV: 96.2 fL (ref 78.0–100.0)
Platelets: 151 10*3/uL (ref 150–400)
RBC: 3.69 MIL/uL — ABNORMAL LOW (ref 4.22–5.81)
RDW: 15.4 % (ref 11.5–15.5)
WBC: 5 10*3/uL (ref 4.0–10.5)

## 2016-10-14 LAB — URINE CULTURE

## 2016-10-14 LAB — MAGNESIUM: MAGNESIUM: 1.9 mg/dL (ref 1.7–2.4)

## 2016-10-14 LAB — PHOSPHORUS: PHOSPHORUS: 2.6 mg/dL (ref 2.5–4.6)

## 2016-10-14 MED ORDER — OSELTAMIVIR PHOSPHATE 30 MG PO CAPS: 30.0000 mg | ORAL_CAPSULE | Freq: Two times a day (BID) | ORAL | 0 refills | Status: AC

## 2016-10-14 MED ORDER — ENSURE ENLIVE PO LIQD: 237.0000 mL | Freq: Two times a day (BID) | ORAL | 0 refills | Status: DC

## 2016-10-14 MED ORDER — LEVOTHYROXINE SODIUM 75 MCG PO TABS: 75.0000 ug | ORAL_TABLET | Freq: Every day | ORAL | Status: DC

## 2016-10-14 MED ORDER — AMLODIPINE BESYLATE 5 MG PO TABS: 5.0000 mg | ORAL_TABLET | Freq: Every day | ORAL | Status: DC

## 2016-10-14 NOTE — Clinical Social Work Placement (Addendum)
   CLINICAL SOCIAL WORK PLACEMENT  NOTE  Date:  10/14/2016  Patient Details  Name: [name] [name]. MRN: [id number] Date of Birth: 05-04-1923  Clinical Social Work is seeking post-discharge placement for this patient at the [place] level of care (*CSW will initial, date and re-position this form in  chart as items are completed):  Yes   Patient/family provided with [place] Work Department's list of facilities offering this level of care within the geographic area requested by the patient (or if unable, by the patient's family).  Yes   Patient/family informed of their freedom to choose among providers that offer the needed level of care, that participate in Medicare, Medicaid or managed care program needed by the patient, have an available bed and are willing to accept the patient.  Yes   Patient/family informed of [facility]'s ownership interest in [facility] and [facility], as well as of the fact that they are under no obligation to receive care at these facilities.  PASRR submitted to EDS on       PASRR number received on       Existing PASRR number confirmed on       FL2 transmitted to all facilities in geographic area requested by pt/family on       FL2 transmitted to all facilities within larger geographic area on       Patient informed that his/her managed care company has contracts with or will negotiate with certain facilities, including the following:  [facility]     Yes   Patient/family informed of bed offers received.  Patient chooses bed at [facility]     Physician recommends and patient chooses bed at [facility]    Patient to be transferred to [facility] on 10/14/16.  Patient to be transferred to facility by [facility]     Patient family notified on   of transfer. 10/14/2016  Name of family member notified:      Son [name]  PHYSICIAN       Additional Comment:     _______________________________________________ Lia Hopping, [place] 10/14/2016, 12:03 PM

## 2016-10-14 NOTE — NC FL2 (Signed)
[place] LEVEL OF CARE SCREENING TOOL     IDENTIFICATION  Patient Name: [name] [name]. Birthdate: Apr 21, 1923 Sex: male Admission Date (Current Location): 10/12/2016  [facility] [place] Number:  Herbalist and Address:  [facility],  [place] [address] [place]      Provider Number: [id number]  Attending Physician Name and Address:  Murlean Iba, MD  Relative Name and Phone Number:       Current Level of Care: Hospital Recommended Level of Care: [place] Prior Approval Number:    Date Approved/Denied:   PASRR Number:    Discharge Plan:      Current Diagnoses: Patient Active Problem List   Diagnosis Date Noted  . Elevated troponin I level 10/13/2016  . Protein-calorie malnutrition, severe 10/13/2016  . ARF (acute renal failure) ([place]) 10/12/2016  . Fall at home 10/12/2016  . Altered mental state 10/12/2016  . Leukocytosis 10/12/2016  . HLD (hyperlipidemia) 04/20/2016  . BPH (benign prostatic hyperplasia) 12/08/2015  . Influenza type B bronchitis 11/30/2015  . Fever 11/27/2015  . Hypotension 11/27/2015  . CKD (chronic kidney disease) stage 3, GFR 30-59 ml/min 11/27/2015  . S/P total knee arthroplasty 07/13/2015  . OA (osteoarthritis) of knee 06/29/2015  . Ectropion 05/06/2015  . Lateral meniscal tear 02/05/2015  . Hyponatremia 10/20/2013  . Abdominal pain 10/20/2013  . Pancreatitis 10/20/2013  . Dehydration 10/20/2013  . Weakness 10/19/2013  . Hypertension   . 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 Hypothyroidism (acquired)   . Anemia, B12 deficiency   . Meibomian gland disease 08/06/2013  . Primary open angle glaucoma 06/19/2013  . Arthritis of shoulder region, right 12/15/2011  . Exposure keratitis 08/17/2011  . Dry eye 07/13/2011    Orientation RESPIRATION BLADDER Height & Weight     Self, Time, Situation, Place  Normal Continent Weight: 183 lb 11.2 oz (83.3 kg) (bed  weight) Height:  5\' 11"  (180.3 cm)  BEHAVIORAL SYMPTOMS/MOOD NEUROLOGICAL BOWEL NUTRITION STATUS      Continent Diet  AMBULATORY STATUS COMMUNICATION OF NEEDS Skin   Limited Assist Verbally Normal                       Personal Care Assistance Level of Assistance  Bathing, Feeding, Dressing Bathing Assistance: Limited assistance Feeding assistance: Independent Dressing Assistance: Limited assistance     Functional Limitations Info  Sight, Hearing, Speech Sight Info: Adequate Hearing Info: Adequate Speech Info: Adequate    SPECIAL CARE FACTORS FREQUENCY  PT (By licensed PT)     PT Frequency: 5              Contractures Contractures Info: Present    Additional Factors Info  Code Status, Allergies, Isolation Precautions Code Status Info: DNR Allergies Info: Percocet Oxycodone-acetaminophen, Robaxin Methocarbamol, Diovan Valsartan     Isolation Precautions Info: Droplet Precautions.      Current Medications (10/14/2016):  This is the current hospital active medication list Current Facility-Administered Medications  Medication Dose Route Frequency Provider Last Rate Last Dose  . acetaminophen (TYLENOL) tablet 650 mg  650 mg Oral Q6H PRN Clanford Marisa Hua, MD   650 mg at 10/12/16 2202  . amLODipine (NORVASC) tablet 5 mg  5 mg Oral Daily Clanford Marisa Hua, MD   5 mg at 10/14/16 0841  . enoxaparin (LOVENOX) injection 40 mg  40 mg Subcutaneous Q24H Clanford Marisa Hua, MD   40 mg at 10/13/16 2100  .  feeding supplement (ENSURE ENLIVE) (ENSURE ENLIVE) liquid 237 mL  237 mL Oral BID BM Clanford L Johnson, MD   237 mL at 10/14/16 1000  . finasteride (PROSCAR) tablet 5 mg  5 mg Oral q morning - 10a Clanford Marisa Hua, MD   5 mg at 10/14/16 0842  . hydrALAZINE (APRESOLINE) injection 5 mg  5 mg Intravenous Q4H PRN Clanford L Johnson, MD      . levothyroxine (SYNTHROID, LEVOTHROID) tablet 75 mcg  75 mcg Oral QAC breakfast Clanford Marisa Hua, MD   75 mcg at 10/14/16 0841  .  oseltamivir (TAMIFLU) capsule 30 mg  30 mg Oral BID Clanford Marisa Hua, MD   30 mg at 10/14/16 0841  . pravastatin (PRAVACHOL) tablet 80 mg  80 mg Oral QPM Clanford Marisa Hua, MD   80 mg at 10/13/16 1813  . predniSONE (DELTASONE) tablet 2.5 mg  2.5 mg Oral Q2000 Clanford Marisa Hua, MD   2.5 mg at 10/13/16 2100  . predniSONE (DELTASONE) tablet 5 mg  5 mg Oral Q breakfast Clanford Marisa Hua, MD   5 mg at 10/14/16 0841  . senna-docusate (Senokot-S) tablet 1 tablet  1 tablet Oral QHS PRN Clanford Marisa Hua, MD      . vitamin B-12 (CYANOCOBALAMIN) tablet 1,000 mcg  1,000 mcg Oral Daily Clanford Marisa Hua, MD   1,000 mcg at 10/14/16 [id number]     Discharge Medications: Please see discharge summary for a list of discharge medications.  Relevant Imaging Results:  Relevant Lab Results:   Additional Information SS#: [id number]  Lia Hopping, LCSW

## 2016-10-14 NOTE — Progress Notes (Signed)
Pt will be discharged back to [facility] via transportation from facility. Discharge instructions given and all questions answered.

## 2016-10-14 NOTE — Progress Notes (Signed)
LCSWA sent d/c summary and FL2.

## 2016-10-14 NOTE — Progress Notes (Signed)
The patient has been seen in conjunction with Reino Bellis, NP-C. All aspects of care have been considered and discussed. The patient has been personally interviewed, examined, and all clinical data has been reviewed.  Will sign off. Call if further assistance needed.     Progress Note  Patient Name: [name] [name]. Date of Encounter: 10/14/2016  Primary Cardiologist: Reola Calkins Tamala Julian)  Subjective   Feeling better this morning. No chest pain or dyspnea.  Inpatient Medications    Scheduled Meds: . amLODipine  5 mg Oral Daily  . enoxaparin (LOVENOX) injection  40 mg Subcutaneous Q24H  . feeding supplement (ENSURE ENLIVE)  237 mL Oral BID BM  . finasteride  5 mg Oral q morning - 10a  . levothyroxine  75 mcg Oral QAC breakfast  . oseltamivir  30 mg Oral BID  . pravastatin  80 mg Oral QPM  . predniSONE  2.5 mg Oral Q2000  . predniSONE  5 mg Oral Q breakfast  . vitamin B-12  1,000 mcg Oral Daily   Continuous Infusions:  PRN Meds: acetaminophen, hydrALAZINE, senna-docusate   Vital Signs    Vitals:   10/13/16 1134 10/13/16 1348 10/13/16 2100 10/14/16 0549  BP:  140/66 (!) 159/61 (!) 147/53  Pulse:  75 70 67  Resp:  20 20 20   Temp:  98 F (36.7 C) 97.9 F (36.6 C) 98.8 F (37.1 C)  TempSrc:  Oral Oral Oral  SpO2:  97% 96% 96%  Weight: 183 lb 11.2 oz (83.3 kg)     Height:        Intake/Output Summary (Last 24 hours) at 10/14/16 0809 Last data filed at 10/14/16 0549  Gross per 24 hour  Intake              825 ml  Output                0 ml  Net              825 ml   Filed Weights   10/12/16 1407 10/13/16 1134  Weight: 180 lb (81.6 kg) 183 lb 11.2 oz (83.3 kg)    Telemetry    n/a - Personally Reviewed  ECG    n/a - Personally Reviewed  Physical Exam   General: Well developed, well nourished, older WM male appearing in no acute distress. Head: Normocephalic, atraumatic.  Neck: Supple without bruits, JVD. Lungs:  Resp regular and unlabored,  CTA. Heart: RRR, S1, S2, no S3, S4, or murmur; no rub. Abdomen: Soft, non-tender, non-distended with normoactive bowel sounds. No hepatomegaly. No rebound/guarding. No obvious abdominal masses. Extremities: No clubbing, cyanosis, edema. Distal pedal pulses are 2+ bilaterally. Neuro: Alert and oriented X 3. Moves all extremities spontaneously. Psych: Normal affect.  Labs    Chemistry Recent Labs Lab 10/12/16 1529 10/12/16 1916 10/13/16 0528 10/14/16 0509  NA 131*  --  133* 138  K 3.7  --  3.7 3.7  CL 94*  --  102 105  CO2 29  --  25 27  GLUCOSE 84  --  77 162*  BUN 18  --  23* 23*  CREATININE 1.46* 1.37* 1.46* 1.29*  CALCIUM 8.8*  --  7.7* 8.1*  PROT 6.5  --   --   --   ALBUMIN 3.6  --   --   --   AST 26  --   --   --   ALT 20  --   --   --  ALKPHOS 43  --   --   --   BILITOT 1.3*  --   --   --   GFRNONAA 40* 43* 40* 47*  GFRAA 47* 50* 47* 54*  ANIONGAP 8  --  6 6     Hematology Recent Labs Lab 10/12/16 1916 10/13/16 0528 10/14/16 0509  WBC 12.7* 12.0* 5.0  RBC 4.08* 3.70* 3.69*  HGB 13.4 12.0* 11.7*  HCT 39.9 36.4* 35.5*  MCV 97.8 98.4 96.2  MCH 32.8 32.4 31.7  MCHC 33.6 33.0 33.0  RDW 15.0 15.6* 15.4  PLT 167 159 151    Cardiac Enzymes Recent Labs Lab 10/12/16 1916 10/13/16 0013 10/13/16 0817  TROPONINI 0.12* 0.18* 0.16*   No results for input(s): TROPIPOC in the last 168 hours.   BNPNo results for input(s): BNP, PROBNP in the last 168 hours.   DDimer No results for input(s): DDIMER in the last 168 hours.    Radiology    Dg Chest 2 View  Result Date: 10/12/2016 CLINICAL DATA:  Fever and confusion EXAM: CHEST  2 VIEW COMPARISON:  11/26/2015 FINDINGS: Mild a basilar atelectasis/ scarring unchanged from the prior study. Negative for pneumonia. Negative for heart failure or effusion. Right shoulder replacement IMPRESSION: Chronic changes the lung bases.  No superimposed acute abnormality. Electronically Signed   By: Franchot Gallo M.D.   On:  10/12/2016 15:20   Ct Head Wo Contrast  Result Date: 10/12/2016 CLINICAL DATA:  Confusion, fever, weakness, altered mental status EXAM: CT HEAD WITHOUT CONTRAST TECHNIQUE: Contiguous axial images were obtained from the base of the skull through the vertex without intravenous contrast. COMPARISON:  08/26/2015 FINDINGS: Brain: No evidence of acute infarction, hemorrhage, hydrocephalus, extra-axial collection or mass lesion/mass effect. Subcortical white matter and periventricular small vessel ischemic changes. Vascular: Intracranial atherosclerosis. Skull: Normal. Negative for fracture or focal lesion. Sinuses/Orbits: Mild mucosal thickening of the right maxillary sinus. Mastoid air cells are clear. Other: None. IMPRESSION: No evidence of acute intracranial abnormality. Mild small vessel ischemic changes. Electronically Signed   By: Julian Hy M.D.   On: 10/12/2016 15:28   Dg Chest Port 1 View  Result Date: 10/13/2016 CLINICAL DATA:  Fever EXAM: PORTABLE CHEST 1 VIEW COMPARISON:  October 12, 2016 and November 26, 2015 FINDINGS: There is stable scarring in the left base region. There is a questionable small pleural effusion on the left as well. Lungs elsewhere are clear. Heart is borderline enlarged with pulmonary vascularity within normal limits. No adenopathy. There is atherosclerotic calcification in the aorta. Patient is status post total shoulder replacement on the right. IMPRESSION: Scarring left base with probable small left pleural effusion. Lungs elsewhere clear. Stable cardiac prominence. There is aortic atherosclerosis. Electronically Signed   By: Lowella Grip III M.D.   On: 10/13/2016 07:07    Cardiac Studies   TTE: 10/13/16  Study Conclusions  - Left ventricle: The cavity size was normal. There was moderate   concentric hypertrophy. Systolic function was normal. The   estimated ejection fraction was in the range of 60% to 65%. Wall   motion was normal; there were no regional  wall motion   abnormalities. Doppler parameters are consistent with abnormal   left ventricular relaxation (grade 1 diastolic dysfunction). - Aortic valve: There was trivial regurgitation.  Patient Profile     83 y.o. male with PMH of HTN, HL, hypopituitarism, renal CA who presented after a fall, flu + and dehydrated with positive trop.   Assessment & Plan  1. Elevated Trop: Flat non ACS trend noted. Likely demand ischemia 2/2 acute illness and tachycardia. Also noted in the setting of elevated Cr. EKG nonacute. Denies any anginal symptoms prior to admission, and is actually a very active 83 yo.  -- echo showed normal EF with no WMA  2. Fever/generalized weakness: Flu +, currently on tamiflu, WBC normal today  3. AKI: Cr elevated on admission, currently on IVF, improving  4. Leukocytosis: On chronic steroids for hypopituitarism, lactic acid normal  5. HTN: elevated on admission, but now improved. On norvasc  Signed, Reino Bellis, NP  10/14/2016, 8:09 AM

## 2016-10-14 NOTE — Progress Notes (Signed)
Plan for d/c to SNF, discharge planning per CSW. [phone] 

## 2016-10-14 NOTE — Progress Notes (Signed)
Acute confusion/delirium

## 2016-10-14 NOTE — Progress Notes (Signed)
OT Cancellation Note  Patient Details Name: [name] [name]. MRN: [id number] DOB: [birth year]   Cancelled Treatment:    Reason Eval/Treat Not Completed: Other (comment) noted that pt is being discharged to SNF today. Will defer OT evaluation to that venue.  Kailin Principato 10/14/2016, 11:55 AM  Lesle Chris, OTR/L [phone] 10/14/2016

## 2016-10-14 NOTE — Discharge Summary (Signed)
Physician Discharge Summary  [name] [name]. [id number] DOB: [birth year] DOA: 10/12/2016  PCP: [name], MD  Admit date: 10/12/2016 Discharge date: 10/14/2016  Admitted From: [facility]F  Disposition:  SNF  Recommendations for Outpatient Follow-up:  1. Follow up with PCP in 1-2 weeks 2. Please repeat TSH level in 3-4 months   Discharge Condition: STABLE  CODE STATUS: DNR  Brief/Interim Summary: HPI: [name] [name]. is a 83 y.o. male who presented to ED with son from his ALF with complaints of weakness, confusion, and fall.  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 He was noted to be febrile with temp of 101.  Pt presented with his son who reports that he is residing at an independent living facility and is very sharp when feeling well. Son reports that he speaks with his father every day and last night he started reporting that he was feeling sick, and this morning he told him that he was feeling miserable. Pt had a fall this morning, and was found to have a fever, so he was sent to the ER.  He was noted in ED to have fever of 101 and was tachycardic.  No clear source of infection was found but there is concern for influenza.  He also is noted to be dehydrated and mental status has improved after IVFs.  He also has acute renal failure.  He is being admitted for further management.    Influenza A - Continue tamiflu 30 mg po BID, treated symptomatically, tylenol as needed for fever.  Pt improved on 2/16 to his baseline.  He should complete tamiflu 10/17/16.  He is stable for discharge today.  He is going to SNF.    Acute Renal Failure on CKD stage 3 - AKI resolved now, was secondary to dehydration, improved with IVFs.    Generalized weakness - secondary to above, treating supportively, fall precautions recommended. PT / OT evaluation recommending SNF.    Elevated troponins possible NSTEMI - asked  for cardiology to evaluate as the stress of the flu may have caused an MI. They did not feel this was cardiac ischemia - see notes.  They have signed off.    Hyponatremia - secondary to dehydration, improvement with IVF hydration. Normal sodium level at discharge.    Fall at home - check orthostatics, supportive therapy as above, fall precautions. PT evaluation recommending SNF.    Acute confusion/delirium -resolved now. CT head negative for acute findings.   Leukocytosis - resolved now, thought secondary to chronic steroids versus acute infection, no evidence found for acute bacterial infection, follow cultures, flu test pending, repeat cxr: no pneumonia seen. WBC normal on day of discharge.     Sinus Tachycardia - Resolved now.  secondary to febrile illness, improving with supportive therapy.   Dehydration - treating as above, see orders.   Hypopituitarism - pt takes daily steroids, I see no clinical need to stress dose steroids at this time as his vitals are stable and clinically improving. Monitor clinically.   Hyperthyroidism - TSH level really low,  Reduced dose of levothyroxine to 75 mcg.   DNR - confirmed on admission.   Hypertension - Pt has some elevated blood pressures, will provide hydralazine as needed for really high readings, follow clinically, started on low dose amlodipine daily.    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)   Weakness   Hyponatremia   Dehydration   Fever   Fall at home   Altered mental state   Leukocytosis   Elevated troponin I level   Protein-calorie malnutrition, severe  Discharge Instructions  Discharge Instructions    Increase activity slowly    Complete by:  As directed      Allergies as of 10/14/2016       Reactions   Percocet [oxycodone-acetaminophen] Other (See Comments)   Just doesn't like it   Robaxin [methocarbamol]    Diovan [valsartan] Rash      Medication List    TAKE these medications   acetaminophen 325 MG tablet Commonly known as:  TYLENOL Take 2 tablets (650 mg total) by mouth every 6 (six) hours as needed for mild pain or fever.   amLODipine 5 MG tablet Commonly known as:  NORVASC Take 1 tablet (5 mg total) by mouth daily. Start taking on:  10/15/2016   Coenzyme Q10 300 MG Caps Take 1 capsule by mouth daily.   feeding supplement (ENSURE ENLIVE) Liqd Take 237 mLs by mouth 2 (two) times daily between meals.   finasteride 5 MG tablet Commonly known as:  PROSCAR Take 5 mg by mouth every morning.   levothyroxine 75 MCG tablet Commonly known as:  SYNTHROID, LEVOTHROID Take 1 tablet (75 mcg total) by mouth daily. What changed:  medication strength  how much to take   MULTIVITAMIN & MINERAL PO Take 1 tablet by mouth daily.   oseltamivir 30 MG capsule Commonly known as:  TAMIFLU Take 1 capsule (30 mg total) by mouth 2 (two) times daily.   pravastatin 80 MG tablet Commonly known as:  PRAVACHOL Take 80 mg by mouth every evening.   predniSONE 5 MG tablet Commonly known as:  DELTASONE Take 2.5-5 mg by mouth 2 (two) times daily. Take 1 tablet (5 mg) in the am and Take 1/2 tablet (5 mg) in the evening.   testosterone cypionate 100 MG/ML injection Commonly known as:  DEPO-TESTOSTERONE Inject 0.57ml intramuscularly once weekly   vitamin B-12 1000 MCG tablet Commonly known as:  CYANOCOBALAMIN Take 1,000 mcg by mouth daily.   VITAMIN D (ERGOCALCIFEROL) PO Take 2,000 Units by mouth daily.      Follow-up Information    [name],MICHAEL D, MD. Schedule an appointment as soon as possible for a visit in 2 week(s).   Specialty:  Endocrinology Contact information: [place] [address] [phone]          Allergies   Allergen Reactions  . Percocet [Oxycodone-Acetaminophen] Other (See Comments)    Just doesn't like it  . Robaxin [Methocarbamol]   . Diovan [Valsartan] Rash    Procedures/Studies: Dg Chest 2 View  Result Date: 10/12/2016 CLINICAL DATA:  Fever and confusion EXAM: CHEST  2 VIEW COMPARISON:  11/26/2015 FINDINGS: Mild a basilar atelectasis/ scarring unchanged from the prior study. Negative for pneumonia. Negative for heart failure or effusion. Right shoulder replacement IMPRESSION: Chronic changes the lung bases.  No superimposed acute abnormality. Electronically Signed   By: Franchot Gallo M.D.   On: 10/12/2016 15:20   Ct Head Wo Contrast  Result Date: 10/12/2016 CLINICAL DATA:  Confusion, fever, weakness, altered mental status EXAM: CT HEAD WITHOUT CONTRAST TECHNIQUE: Contiguous axial images were obtained from the base of the skull through the vertex without intravenous contrast. COMPARISON:  08/26/2015 FINDINGS: Brain: No evidence of acute  infarction, hemorrhage, hydrocephalus, extra-axial collection or mass lesion/mass effect. Subcortical white matter and periventricular small vessel ischemic changes. Vascular: Intracranial atherosclerosis. Skull: Normal. Negative for fracture or focal lesion. Sinuses/Orbits: Mild mucosal thickening of the right maxillary sinus. Mastoid air cells are clear. Other: None. IMPRESSION: No evidence of acute intracranial abnormality. Mild small vessel ischemic changes. Electronically Signed   By: Julian Hy M.D.   On: 10/12/2016 15:28   Dg Chest Port 1 View  Result Date: 10/13/2016 CLINICAL DATA:  Fever EXAM: PORTABLE CHEST 1 VIEW COMPARISON:  October 12, 2016 and November 26, 2015 FINDINGS: There is stable scarring in the left base region. There is a questionable small pleural effusion on the left as well. Lungs elsewhere are clear. Heart is borderline enlarged with pulmonary vascularity within normal limits. No adenopathy. There is atherosclerotic calcification  in the aorta. Patient is status post total shoulder replacement on the right. IMPRESSION: Scarring left base with probable small left pleural effusion. Lungs elsewhere clear. Stable cardiac prominence. There is aortic atherosclerosis. Electronically Signed   By: Lowella Grip III M.D.   On: 10/13/2016 07:07    Subjective: Pt says that he feels much better today.    Discharge Exam: Vitals:   10/14/16 0549 10/14/16 0841  BP: (!) 147/53 (!) 145/55  Pulse: 67   Resp: 20   Temp: 98.8 F (37.1 C)    Vitals:   10/13/16 1348 10/13/16 2100 10/14/16 0549 10/14/16 0841  BP: 140/66 (!) 159/61 (!) 147/53 (!) 145/55  Pulse: 75 70 67   Resp: 20 20 20    Temp: 98 F (36.7 C) 97.9 F (36.6 C) 98.8 F (37.1 C)   TempSrc: Oral Oral Oral   SpO2: 97% 96% 96%   Weight:      Height:       General: Pt is alert, awake, not in acute distress Cardiovascular: RRR, S1/S2 +, no rubs, no gallops Respiratory: CTA bilaterally, no wheezing, no rhonchi Abdominal: Soft, NT, ND, bowel sounds + Extremities: no edema, no cyanosis  The results of significant diagnostics from this hospitalization (including imaging, microbiology, ancillary and laboratory) are listed below for reference.     Microbiology: Recent Results (from the past 240 hour(s))  Urine culture     Status: Abnormal   Collection Time: 10/12/16  2:12 PM  Result Value Ref Range Status   Specimen Description URINE, RANDOM  Final   Special Requests NONE  Final   Culture MULTIPLE SPECIES PRESENT, SUGGEST RECOLLECTION (A)  Final   Report Status 10/14/2016 FINAL  Final  Blood Culture (routine x 2)     Status: None (Preliminary result)   Collection Time: 10/12/16  3:29 PM  Result Value Ref Range Status   Specimen Description BLOOD LEFT ANTECUBITAL  Final   Special Requests BOTTLES DRAWN AEROBIC AND ANAEROBIC 5CC  Final   Culture   Final    NO GROWTH 2 DAYS Performed at [facility], [place] [address], [place],  [address]    Report  Status PENDING  Incomplete     Labs: BNP (last 3 results) No results for input(s): BNP in the last 8760 hours. Basic Metabolic Panel:  Recent Labs Lab 10/12/16 1529 10/12/16 1916 10/13/16 0528 10/14/16 0509  NA 131*  --  133* 138  K 3.7  --  3.7 3.7  CL 94*  --  102 105  CO2 29  --  25 27  GLUCOSE 84  --  77 162*  BUN 18  --  23* 23*  CREATININE 1.46* 1.37* 1.46* 1.29*  CALCIUM 8.8*  --  7.7* 8.1*  MG  --   --  1.8 1.9  PHOS  --   --  4.0 2.6   Liver Function Tests:  Recent Labs Lab 10/12/16 1529  AST 26  ALT 20  ALKPHOS 43  BILITOT 1.3*  PROT 6.5  ALBUMIN 3.6   No results for input(s): LIPASE, AMYLASE in the last 168 hours. No results for input(s): AMMONIA in the last 168 hours. CBC:  Recent Labs Lab 10/12/16 1529 10/12/16 1916 10/13/16 0528 10/14/16 0509  WBC 13.0* 12.7* 12.0* 5.0  NEUTROABS 8.5*  --   --   --   HGB 13.6 13.4 12.0* 11.7*  HCT 40.6 39.9 36.4* 35.5*  MCV 98.3 97.8 98.4 96.2  PLT 187 167 159 151   Cardiac Enzymes:  Recent Labs Lab 10/12/16 1916 10/13/16 0013 10/13/16 0817  TROPONINI 0.12* 0.18* 0.16*   BNP: Invalid input(s): POCBNP CBG: No results for input(s): GLUCAP in the last 168 hours. D-Dimer No results for input(s): DDIMER in the last 72 hours. Hgb A1c No results for input(s): HGBA1C in the last 72 hours. Lipid Profile No results for input(s): CHOL, HDL, LDLCALC, TRIG, CHOLHDL, LDLDIRECT in the last 72 hours. Thyroid function studies  Recent Labs  10/12/16 1916  TSH <0.010*   Anemia work up No results for input(s): VITAMINB12, FOLATE, FERRITIN, TIBC, IRON, RETICCTPCT in the last 72 hours. Urinalysis    Component Value Date/Time   COLORURINE STRAW (A) 10/12/2016 1412   APPEARANCEUR CLEAR 10/12/2016 1412   LABSPEC 1.010 10/12/2016 1412   PHURINE 7.0 10/12/2016 1412   GLUCOSEU NEGATIVE 10/12/2016 1412   HGBUR SMALL (A) 10/12/2016 1412   BILIRUBINUR NEGATIVE 10/12/2016 1412   KETONESUR 5 (A) 10/12/2016 1412    PROTEINUR NEGATIVE 10/12/2016 1412   UROBILINOGEN 0.2 06/24/2015 1134   NITRITE NEGATIVE 10/12/2016 1412   LEUKOCYTESUR NEGATIVE 10/12/2016 1412   Sepsis Labs Invalid input(s): PROCALCITONIN,  WBC,  LACTICIDVEN Microbiology Recent Results (from the past 240 hour(s))  Urine culture     Status: Abnormal   Collection Time: 10/12/16  2:12 PM  Result Value Ref Range Status   Specimen Description URINE, RANDOM  Final   Special Requests NONE  Final   Culture MULTIPLE SPECIES PRESENT, SUGGEST RECOLLECTION (A)  Final   Report Status 10/14/2016 FINAL  Final  Blood Culture (routine x 2)     Status: None (Preliminary result)   Collection Time: 10/12/16  3:29 PM  Result Value Ref Range Status   Specimen Description BLOOD LEFT ANTECUBITAL  Final   Special Requests BOTTLES DRAWN AEROBIC AND ANAEROBIC 5CC  Final   Culture   Final    NO GROWTH 2 DAYS Performed at [facility], [place] [address] [place], [place] [address]    Report Status PENDING  Incomplete   Time coordinating discharge: 32 minutes  SIGNED:  Irwin Brakeman, MD  [facility] 10/14/2016, 12:14 PM Pager   If 7PM-7AM, please contact night-coverage [email or web address] Password [email or web address]

## 2016-10-17 LAB — CULTURE, BLOOD (ROUTINE X 2): Culture: NO GROWTH

## 2016-10-18 ENCOUNTER — Encounter: Payer: Self-pay | Admitting: Internal Medicine

## 2016-10-18 ENCOUNTER — Non-Acute Institutional Stay (SKILLED_NURSING_FACILITY): Payer: PPO | Admitting: Internal Medicine

## 2016-10-18 DIAGNOSIS — J111 Influenza due to unidentified influenza virus with other respiratory manifestations: Secondary | ICD-10-CM | POA: Diagnosis not present

## 2016-10-18 DIAGNOSIS — I10 Essential (primary) hypertension: Secondary | ICD-10-CM | POA: Diagnosis not present

## 2016-10-18 DIAGNOSIS — E2740 Unspecified adrenocortical insufficiency: Secondary | ICD-10-CM

## 2016-10-18 DIAGNOSIS — E038 Other specified hypothyroidism: Secondary | ICD-10-CM | POA: Diagnosis not present

## 2016-10-18 DIAGNOSIS — R410 Disorientation, unspecified: Secondary | ICD-10-CM | POA: Diagnosis not present

## 2016-10-18 DIAGNOSIS — E871 Hypo-osmolality and hyponatremia: Secondary | ICD-10-CM | POA: Diagnosis not present
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
## 2016-10-19 DIAGNOSIS — E871 Hypo-osmolality and hyponatremia: Secondary | ICD-10-CM | POA: Diagnosis not present

## 2016-10-20 DIAGNOSIS — Z9889 Other specified postprocedural states: Secondary | ICD-10-CM | POA: Diagnosis not present

## 2016-10-20 DIAGNOSIS — I10 Essential (primary) hypertension: Secondary | ICD-10-CM | POA: Diagnosis not present

## 2016-10-20 DIAGNOSIS — I8393 Asymptomatic varicose veins of bilateral lower extremities: Secondary | ICD-10-CM | POA: Diagnosis not present

## 2016-10-20 DIAGNOSIS — Z905 Acquired absence of kidney: Secondary | ICD-10-CM | POA: Diagnosis not present

## 2016-10-20 DIAGNOSIS — N400 Enlarged prostate without lower urinary tract symptoms: Secondary | ICD-10-CM | POA: Diagnosis not present

## 2016-10-20 DIAGNOSIS — E559 Vitamin D deficiency, unspecified: Secondary | ICD-10-CM | POA: Diagnosis not present

## 2016-10-20 DIAGNOSIS — D519 Vitamin B12 deficiency anemia, unspecified: Secondary | ICD-10-CM | POA: Diagnosis not present

## 2016-10-20 DIAGNOSIS — E230 Hypopituitarism: Secondary | ICD-10-CM | POA: Diagnosis not present

## 2016-10-20 DIAGNOSIS — E7800 Pure hypercholesterolemia, unspecified: Secondary | ICD-10-CM | POA: Diagnosis not present

## 2016-10-21 DIAGNOSIS — D044 Carcinoma in situ of skin of scalp and neck: Secondary | ICD-10-CM | POA: Diagnosis not present

## 2016-10-21 DIAGNOSIS — L570 Actinic keratosis: Secondary | ICD-10-CM | POA: Diagnosis not present

## 2016-10-21 DIAGNOSIS — C4442 Squamous cell carcinoma of skin of scalp and neck: Secondary | ICD-10-CM | POA: Diagnosis not present

## 2016-10-21 DIAGNOSIS — L821 Other seborrheic keratosis: Secondary | ICD-10-CM | POA: Diagnosis not present

## 2016-10-21 DIAGNOSIS — D485 Neoplasm of uncertain behavior of skin: Secondary | ICD-10-CM | POA: Diagnosis not present

## 2016-10-21 DIAGNOSIS — Z85828 Personal history of other malignant neoplasm of skin: Secondary | ICD-10-CM | POA: Diagnosis not present

## 2016-11-08 ENCOUNTER — Encounter: Payer: Self-pay | Admitting: Endocrinology

## 2016-11-27 IMAGING — CR DG CHEST 2V
2 series · 2 of 2 positions shown · non-contrast
Comparison: 07/24/2015

CLINICAL DATA: [AGE] male with fever and hypotension.
Weakness, nausea and fever.

EXAM:
CHEST  2 VIEW

[w chest lat]
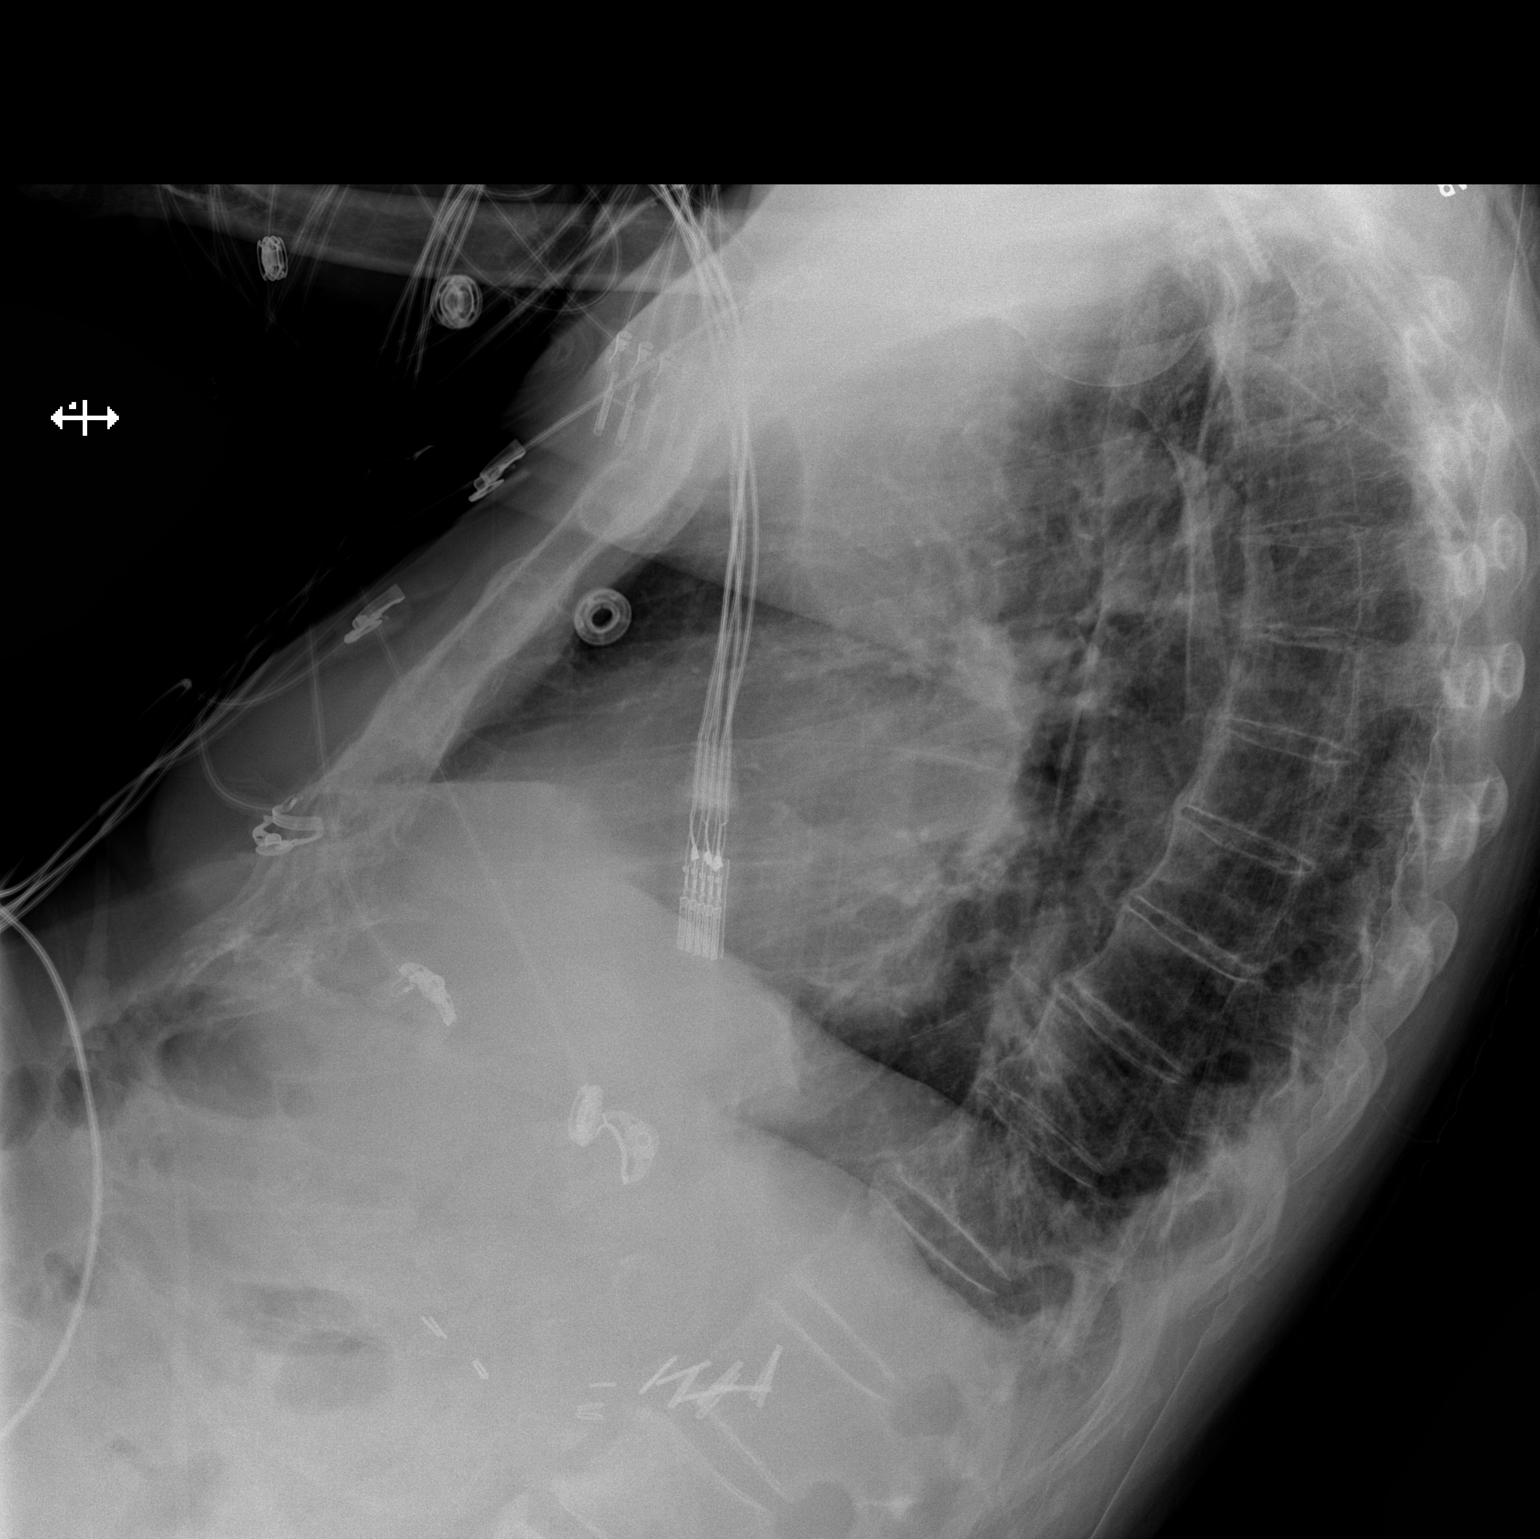

[x chest ap]
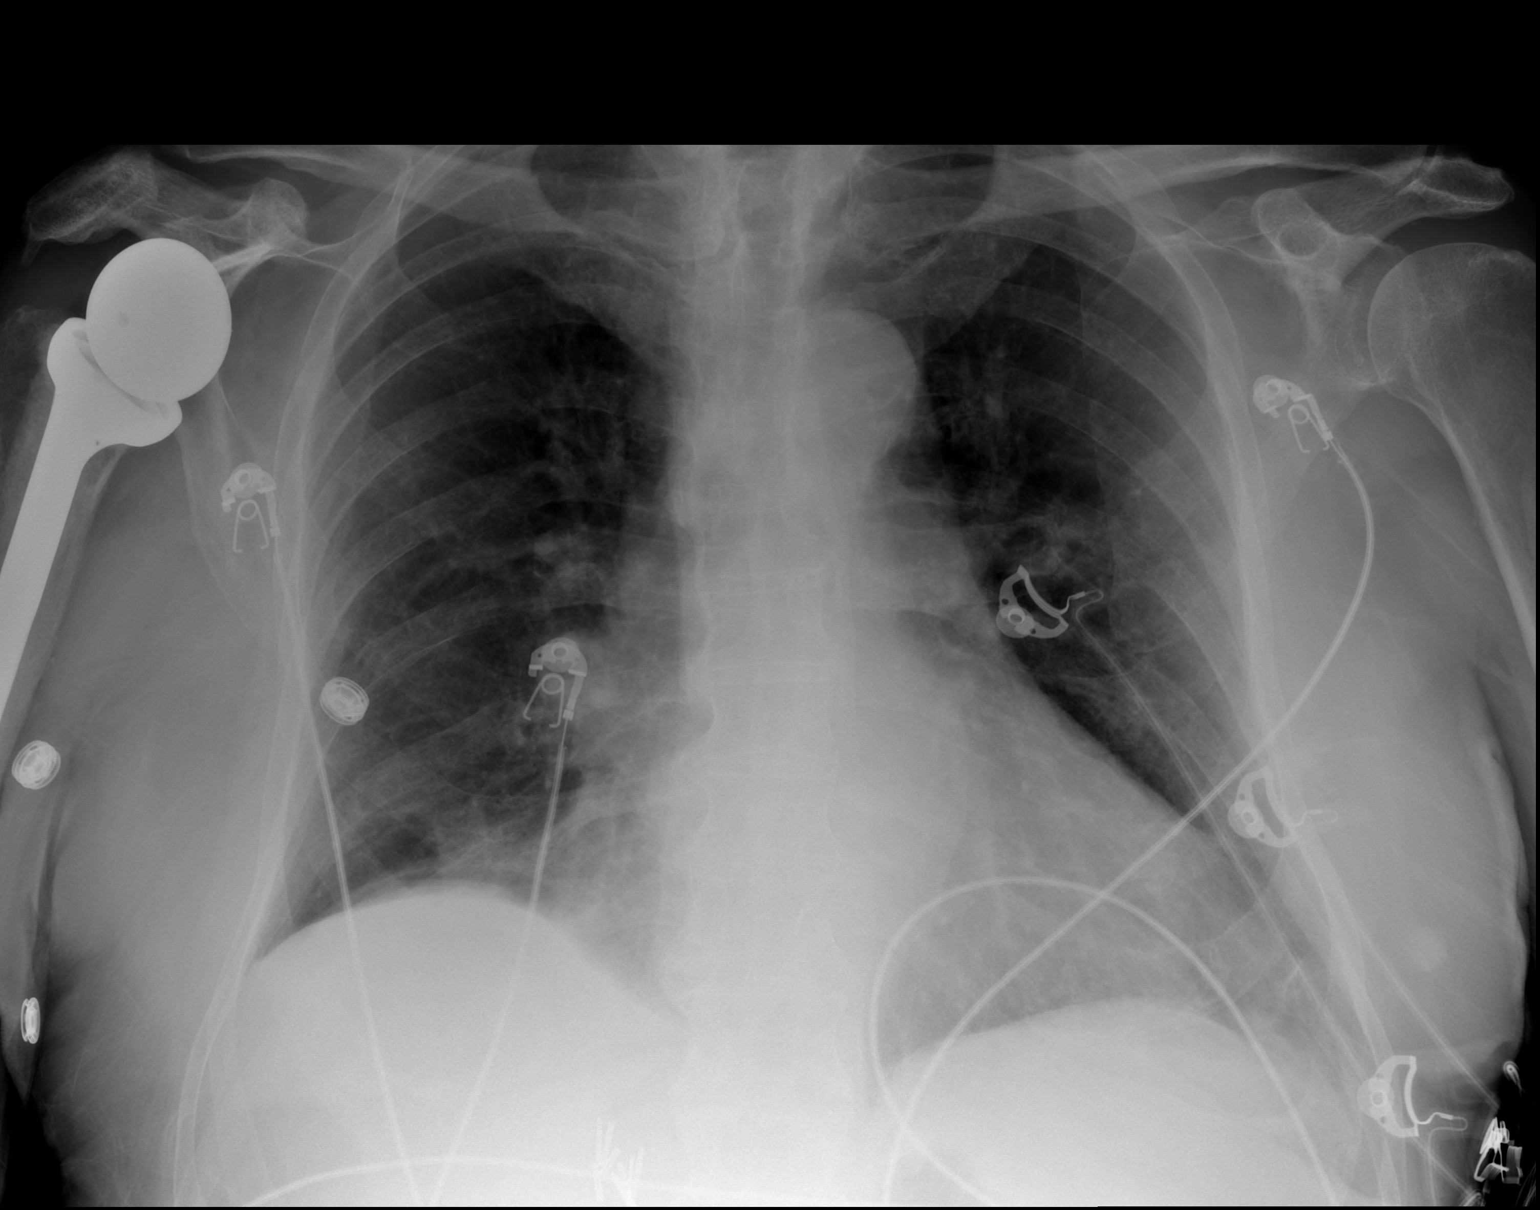

[2 of 2 positions shown; findings below may reference images not displayed]

FINDINGS: Cardiomediastinal contours are unchanged. Prominent right upper cart
pad. No pulmonary edema, confluent airspace disease, pleural
effusion or pneumothorax. Reverse right shoulder arthroplasty in
place.
IMPRESSION: No acute pulmonary process.

## 2016-12-06 DIAGNOSIS — M79671 Pain in right foot: Secondary | ICD-10-CM | POA: Diagnosis not present

## 2016-12-06 DIAGNOSIS — M79672 Pain in left foot: Secondary | ICD-10-CM | POA: Diagnosis not present

## 2016-12-06 DIAGNOSIS — L84 Corns and callosities: Secondary | ICD-10-CM | POA: Diagnosis not present

## 2016-12-06 DIAGNOSIS — B351 Tinea unguium: Secondary | ICD-10-CM | POA: Diagnosis not present

## 2016-12-19 DIAGNOSIS — I10 Essential (primary) hypertension: Secondary | ICD-10-CM | POA: Diagnosis not present

## 2016-12-19 DIAGNOSIS — E230 Hypopituitarism: Secondary | ICD-10-CM | POA: Diagnosis not present

## 2016-12-19 DIAGNOSIS — E7800 Pure hypercholesterolemia, unspecified: Secondary | ICD-10-CM | POA: Diagnosis not present

## 2016-12-19 DIAGNOSIS — D510 Vitamin B12 deficiency anemia due to intrinsic factor deficiency: Secondary | ICD-10-CM | POA: Diagnosis not present

## 2016-12-19 DIAGNOSIS — E559 Vitamin D deficiency, unspecified: Secondary | ICD-10-CM | POA: Diagnosis not present

## 2016-12-22 DIAGNOSIS — E7800 Pure hypercholesterolemia, unspecified: Secondary | ICD-10-CM | POA: Diagnosis not present

## 2016-12-22 DIAGNOSIS — I10 Essential (primary) hypertension: Secondary | ICD-10-CM | POA: Diagnosis not present

## 2016-12-22 DIAGNOSIS — E230 Hypopituitarism: Secondary | ICD-10-CM | POA: Diagnosis not present

## 2016-12-22 DIAGNOSIS — E559 Vitamin D deficiency, unspecified: Secondary | ICD-10-CM | POA: Diagnosis not present

## 2016-12-22 DIAGNOSIS — D519 Vitamin B12 deficiency anemia, unspecified: Secondary | ICD-10-CM | POA: Diagnosis not present

## 2017-01-11 DIAGNOSIS — H04129 Dry eye syndrome of unspecified lacrimal gland: Secondary | ICD-10-CM | POA: Diagnosis not present

## 2017-01-11 DIAGNOSIS — H16213 Exposure keratoconjunctivitis, bilateral: Secondary | ICD-10-CM | POA: Diagnosis not present

## 2017-01-11 DIAGNOSIS — H401133 Primary open-angle glaucoma, bilateral, severe stage: Secondary | ICD-10-CM | POA: Diagnosis not present

## 2017-01-11 DIAGNOSIS — L718 Other rosacea: Secondary | ICD-10-CM | POA: Diagnosis not present

## 2017-01-18 DIAGNOSIS — Z85828 Personal history of other malignant neoplasm of skin: Secondary | ICD-10-CM | POA: Diagnosis not present

## 2017-01-18 DIAGNOSIS — L821 Other seborrheic keratosis: Secondary | ICD-10-CM | POA: Diagnosis not present

## 2017-01-18 DIAGNOSIS — L570 Actinic keratosis: Secondary | ICD-10-CM | POA: Diagnosis not present

## 2017-02-08 ENCOUNTER — Non-Acute Institutional Stay: Payer: PPO | Admitting: Internal Medicine

## 2017-02-08 ENCOUNTER — Encounter: Payer: Self-pay | Admitting: Internal Medicine

## 2017-02-08 VITALS — BP 122/60 | HR 81 | Temp 98.5°F | Ht 71.0 in | Wt 184.0 lb

## 2017-02-08 DIAGNOSIS — N183 Chronic kidney disease, stage 3 unspecified: Secondary | ICD-10-CM

## 2017-02-08 DIAGNOSIS — H401190 Primary open-angle glaucoma, unspecified eye, stage unspecified: Secondary | ICD-10-CM | POA: Diagnosis not present

## 2017-02-08 DIAGNOSIS — E7800 Pure hypercholesterolemia, unspecified: Secondary | ICD-10-CM

## 2017-02-08 DIAGNOSIS — E893 Postprocedural hypopituitarism: Secondary | ICD-10-CM

## 2017-02-08 DIAGNOSIS — E039 Hypothyroidism, unspecified: Secondary | ICD-10-CM | POA: Diagnosis not present

## 2017-02-08 DIAGNOSIS — R350 Frequency of micturition: Secondary | ICD-10-CM

## 2017-02-08 DIAGNOSIS — D518 Other vitamin B12 deficiency anemias: Secondary | ICD-10-CM | POA: Diagnosis not present

## 2017-02-08 DIAGNOSIS — M791 Myalgia: Secondary | ICD-10-CM | POA: Diagnosis not present

## 2017-02-08 DIAGNOSIS — M7918 Myalgia, other site: Secondary | ICD-10-CM | POA: Insufficient documentation

## 2017-02-08 DIAGNOSIS — N401 Enlarged prostate with lower urinary tract symptoms: Secondary | ICD-10-CM

## 2017-02-08 DIAGNOSIS — E236 Other disorders of pituitary gland: Secondary | ICD-10-CM
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
## 2017-02-13 DIAGNOSIS — H401133 Primary open-angle glaucoma, bilateral, severe stage: Secondary | ICD-10-CM | POA: Diagnosis not present

## 2017-03-10 ENCOUNTER — Telehealth: Payer: Self-pay | Admitting: Internal Medicine

## 2017-03-10 NOTE — Telephone Encounter (Signed)
Left msg asking pt to schedule AWV-I with Clarise Cruz (Nurse Health Advisor) at [facility] on 03/29/17. VDM (DD)

## 2017-03-14 DIAGNOSIS — L84 Corns and callosities: Secondary | ICD-10-CM | POA: Diagnosis not present

## 2017-03-14 DIAGNOSIS — M79672 Pain in left foot: Secondary | ICD-10-CM | POA: Diagnosis not present

## 2017-03-14 DIAGNOSIS — B351 Tinea unguium: Secondary | ICD-10-CM | POA: Diagnosis not present

## 2017-03-14 DIAGNOSIS — M79671 Pain in right foot: Secondary | ICD-10-CM | POA: Diagnosis not present

## 2017-03-22 DIAGNOSIS — H401133 Primary open-angle glaucoma, bilateral, severe stage: Secondary | ICD-10-CM | POA: Diagnosis not present

## 2017-03-23 DIAGNOSIS — E7800 Pure hypercholesterolemia, unspecified: Secondary | ICD-10-CM | POA: Diagnosis not present

## 2017-03-23 DIAGNOSIS — M4807 Spinal stenosis, lumbosacral region: Secondary | ICD-10-CM | POA: Diagnosis not present

## 2017-03-23 DIAGNOSIS — S32020D Wedge compression fracture of second lumbar vertebra, subsequent encounter for fracture with routine healing: Secondary | ICD-10-CM | POA: Diagnosis not present

## 2017-03-23 DIAGNOSIS — E230 Hypopituitarism: Secondary | ICD-10-CM | POA: Diagnosis not present

## 2017-03-23 DIAGNOSIS — D518 Other vitamin B12 deficiency anemias: Secondary | ICD-10-CM | POA: Diagnosis not present

## 2017-03-23 DIAGNOSIS — D519 Vitamin B12 deficiency anemia, unspecified: Secondary | ICD-10-CM | POA: Diagnosis not present

## 2017-03-23 DIAGNOSIS — E559 Vitamin D deficiency, unspecified: Secondary | ICD-10-CM | POA: Diagnosis not present

## 2017-03-24 DIAGNOSIS — E559 Vitamin D deficiency, unspecified: Secondary | ICD-10-CM | POA: Diagnosis not present

## 2017-03-24 DIAGNOSIS — I10 Essential (primary) hypertension: Secondary | ICD-10-CM | POA: Diagnosis not present

## 2017-03-24 DIAGNOSIS — E7800 Pure hypercholesterolemia, unspecified: Secondary | ICD-10-CM | POA: Diagnosis not present

## 2017-03-24 DIAGNOSIS — E230 Hypopituitarism: Secondary | ICD-10-CM | POA: Diagnosis not present

## 2017-05-02 DIAGNOSIS — M545 Low back pain: Secondary | ICD-10-CM | POA: Diagnosis not present

## 2017-05-18 DIAGNOSIS — M6389 Disorders of muscle in diseases classified elsewhere, multiple sites: Secondary | ICD-10-CM | POA: Diagnosis not present

## 2017-05-18 DIAGNOSIS — R2689 Other abnormalities of gait and mobility: Secondary | ICD-10-CM | POA: Diagnosis not present

## 2017-05-18 DIAGNOSIS — M545 Low back pain: Secondary | ICD-10-CM | POA: Diagnosis not present

## 2017-05-22 ENCOUNTER — Telehealth: Payer: Self-pay | Admitting: Internal Medicine

## 2017-05-22 DIAGNOSIS — M5416 Radiculopathy, lumbar region: Secondary | ICD-10-CM | POA: Diagnosis not present

## 2017-05-22 NOTE — Telephone Encounter (Signed)
I left a message asking the patient to reschedule his AWV-I with the nurse on 06/20/17 at the [facility] clinic. VDM (DD)

## 2017-05-24 DIAGNOSIS — H401133 Primary open-angle glaucoma, bilateral, severe stage: Secondary | ICD-10-CM | POA: Diagnosis not present

## 2017-06-12 DIAGNOSIS — D510 Vitamin B12 deficiency anemia due to intrinsic factor deficiency: Secondary | ICD-10-CM | POA: Diagnosis not present

## 2017-06-12 DIAGNOSIS — D519 Vitamin B12 deficiency anemia, unspecified: Secondary | ICD-10-CM | POA: Diagnosis not present

## 2017-06-12 DIAGNOSIS — E230 Hypopituitarism: Secondary | ICD-10-CM | POA: Diagnosis not present

## 2017-06-12 DIAGNOSIS — E7800 Pure hypercholesterolemia, unspecified: Secondary | ICD-10-CM | POA: Diagnosis not present

## 2017-06-12 DIAGNOSIS — E559 Vitamin D deficiency, unspecified: Secondary | ICD-10-CM | POA: Diagnosis not present

## 2017-06-12 LAB — BASIC METABOLIC PANEL
BUN: 25 — AB (ref 4–21)
CREATININE: 1.3 (ref 0.6–1.3)
Glucose: 97
Potassium: 4 (ref 3.4–5.3)
Sodium: 140 (ref 137–147)

## 2017-06-12 LAB — VITAMIN D 25 HYDROXY (VIT D DEFICIENCY, FRACTURES): VIT D 25 HYDROXY: 46

## 2017-06-12 LAB — LIPID PANEL
CHOLESTEROL: 130 (ref 0–200)
HDL: 62 (ref 35–70)
LDL CALC: 65
Triglycerides: 70 (ref 40–160)

## 2017-06-12 LAB — CBC AND DIFFERENTIAL
HCT: 41 (ref 41–53)
HEMOGLOBIN: 13.7 (ref 13.5–17.5)
PLATELETS: 254 (ref 150–399)
WBC: 10.1

## 2017-06-12 LAB — HEPATIC FUNCTION PANEL
ALK PHOS: 46 (ref 25–125)
ALT: 14 (ref 10–40)
AST: 13 — AB (ref 14–40)
BILIRUBIN, TOTAL: 0.7

## 2017-06-12 LAB — VITAMIN B12: VITAMIN B 12: 1321

## 2017-06-15 DIAGNOSIS — M5416 Radiculopathy, lumbar region: Secondary | ICD-10-CM | POA: Diagnosis not present

## 2017-06-15 DIAGNOSIS — M545 Low back pain: Secondary | ICD-10-CM | POA: Diagnosis not present

## 2017-06-19 ENCOUNTER — Telehealth: Payer: Self-pay | Admitting: Internal Medicine

## 2017-06-19 NOTE — Telephone Encounter (Signed)
I called the pt to confirm/remind him of AWV appt tomorrow at [facility] clinic.  There was no answer and no option to leave a message. VDM (DD)

## 2017-06-20 ENCOUNTER — Non-Acute Institutional Stay: Payer: PPO

## 2017-06-20 VITALS — BP 152/70 | HR 84 | Temp 97.8°F | Ht 71.0 in | Wt 188.0 lb

## 2017-06-20 DIAGNOSIS — Z0000 Encounter for general adult medical examination without abnormal findings: Secondary | ICD-10-CM | POA: Diagnosis not present

## 2017-06-20 NOTE — Progress Notes (Signed)
Subjective:   [name] [name]. is a 83 y.o. male who presents for an Initial Medicare Annual Wellness Visit at [facility]   Objective:    Today's Vitals   06/20/17 1407  BP: (!) 152/70  Pulse: 84  Temp: 97.8 F (36.6 C)  TempSrc: Oral  SpO2: 96%  Weight: 188 lb (85.3 kg)  Height: 5\' 11"  (1.803 m)   Body mass index is 26.22 kg/m.  Current Medications (verified) Outpatient Encounter Prescriptions as of 06/20/2017  Medication Sig  . acetaminophen (TYLENOL) 325 MG tablet Take 2 tablets (650 mg total) by mouth every 6 (six) hours as needed for mild pain or fever.  . Cholecalciferol (VITAMIN D3) 5000 units TABS Take 1 tablet every other day  . Coenzyme Q10 300 MG CAPS Take 1 capsule by mouth daily.  . finasteride (PROSCAR) 5 MG tablet Take 5 mg by mouth every morning.   . Flaxseed, Linseed, (EQL FLAXSEED OIL) 1200 MG CAPS Take as directed once a day  . Multiple Vitamins-Minerals (MULTIVITAMIN & MINERAL PO) Take 1 tablet by mouth daily.  . Omega-3 Fatty Acids (FISH OIL) 1000 MG CAPS Take by mouth.  . pantoprazole (PROTONIX) 40 MG tablet Take 1 tablet once daily for gastroesophageal reflux  . pravastatin (PRAVACHOL) 80 MG tablet Take 80 mg by mouth every evening.   . predniSONE (DELTASONE) 5 MG tablet Take 1 tablet (5 mg) in the am and Take 1/2 tablet (5 mg) in the evening.  Marland Kitchen SYNTHROID 88 MCG tablet   . testosterone cypionate (DEPOTESTOSTERONE CYPIONATE) 200 MG/ML injection Inject 30 mg (0.15 ml) intramuscularly weekly. Must discard vial after 90 days.  . vitamin B-12 (CYANOCOBALAMIN) 1000 MCG tablet Take 1,000 mcg by mouth daily.  Marland Kitchen amLODipine (NORVASC) 5 MG tablet Take 1 tablet (5 mg total) by mouth daily. (Patient not taking: Reported on 06/20/2017)   No facility-administered encounter medications on file as of 06/20/2017.     Allergies (verified) Percocet [oxycodone-acetaminophen]; Robaxin [methocarbamol]; and Diovan [valsartan]    History: Past Medical History:  Diagnosis Date  . Allergic rhinitis    Prior allergy shots 20 years  . Anemia   . Anemia, B12 deficiency 2000  . Arthritis    Status post left total replacement 8 2011  . BPH (benign prostatic hyperplasia) 2013  . Cancer ([place])    renal cell ca and skin cancer   . Colitis 2014  . Difficult intubation 1994   surgery had to be  stopped due to injury to "throat"  . Difficult intubation 1994   no trouble since.  Required nasotracheal intubation  '02 [facility] / ANESTHESIA RECORD FROM 2013 AND 2016 IN EPIC  . Glaucoma 2007   Status post left trabeculectomy 2007  . History of colon polyps    Colonoscopy 2001  . History of kidney stones   . History of renal cell carcinoma 1994   Status post right nephrectomy  . Hyperlipidemia 2003  . Hypertension   . 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 Hypothyroidism (acquired) 2000  . Nocturia   . Pituitary mass ([place]) 2000   S/p transphenoidal excision 05/1999 ([facility])  . Vitamin D deficiency 2009   Past Surgical History:  Procedure Laterality Date  . BRAIN SURGERY  2000   pituatary gland removed .  Marland Kitchen CHOLECYSTECTOMY  1984  . Ectropion surgery Bilateral 2006  . EYE SURGERY  over last 6 yrs.     trabeculectomy...   Marland Kitchen  EYE SURGERY   cat ext ou  . JOINT REPLACEMENT  2011   knee left  . KNEE ARTHROSCOPY Right 02/06/2015   Procedure: RIGHT ARTHROSCOPY KNEE WITH LATERAL MENSICAL  DEBRIDEMENT;  Surgeon: Gaynelle Arabian, MD;  [facility];  Service: Orthopedics;  Laterality: Right;  . Mohs procedure      for skin cancer on nose   . NEPHRECTOMY Right 1994   Renal cell  . REVERSE SHOULDER ARTHROPLASTY  12/15/2011   Procedure: REVERSE SHOULDER ARTHROPLASTY;  Surgeon: Marin Shutter, MD;  Location: [place];  Service: Orthopedics;  Laterality: Right;  right total reverse shoulder  . TONSILLECTOMY    . TOTAL KNEE ARTHROPLASTY Right 06/29/2015   Procedure: TOTAL KNEE  ARTHROPLASTY;  Surgeon: Gaynelle Arabian, MD;  [facility];  Service: Orthopedics;  Laterality: Right;  . Transsphenoidal excision pituitary tumor  05/1999   A.Tommi Rumps, M.D.([facility])   Family History  Problem Relation Age of Onset  . Lung cancer Father   . Anesthesia problems Neg Hx   . Hypotension Neg Hx   . Malignant hyperthermia Neg Hx   . Pseudochol deficiency Neg Hx    Social History   Occupational History  . Not on file.   Social History Main Topics  . Smoking status: Former Smoker    Types: Pipe    Quit date: 09/26/1988  . Smokeless tobacco: Never Used     Comment: about 20 years  . Alcohol use Yes     Comment: 1.5 ounces of vodka nightly   . Drug use: No  . Sexual activity: Not on file   Tobacco Counseling Counseling given: Not Answered   Activities of Daily Living In your present state of health, do you have any difficulty performing the following activities: 06/20/2017 10/12/2016  Hearing? N N  Vision? N N  Difficulty concentrating or making decisions? N N  Walking or climbing stairs? Y Y  Dressing or bathing? N Y  Doing errands, shopping? [place] Don[place]  Preparing Food and eating ? N -  Using the Toilet? N -  In the past six months, have you accidently leaked urine? N -  Do you have problems with loss of bowel control? N -  Managing your Medications? N -  Managing your Finances? N -  Housekeeping or managing your Housekeeping? N -  Some recent data might be hidden    Immunizations and Health Maintenance Immunization History  Administered Date(s) Administered  . Influenza, Quadrivalent, Recombinant, Inj, Pf 05/19/2016  . Pneumococcal Conjugate-13 06/04/2015   Health Maintenance Due  Topic Date Due  . TETANUS/TDAP  01/22/1942  . PNA vac Low Risk Adult (2 of 2 - PPSV23) 06/03/2016  . INFLUENZA VACCINE  03/29/2017    Patient Care Team: [name], DO as PCP - General (Geriatric Medicine) Community, [name], [name], MD as  Referring Physician (Endocrinology)  Indicate any recent Medical Services you may have received from other than [facility] providers in the past year (date may be approximate).    Assessment:   This is a routine wellness examination for [facility].    Hearing/Vision screen No exam data present  Dietary issues and exercise activities discussed: Current Exercise Habits: Structured exercise class, Type of exercise: strength training/weights, Time (Minutes): 30, Frequency (Times/Week): 3, Weekly Exercise (Minutes/Week): 90, Intensity: Mild, Exercise limited by: None identified  Goals    None     Depression Screen PHQ 2/9 Scores 06/20/2017 02/08/2017 12/07/2015  PHQ - 2 Score 0 0 0  Fall Risk Fall Risk  06/20/2017 02/08/2017 12/07/2015  Falls in the past year? No No Yes  Number falls in past yr: - - 2 or more  Injury with Fall? - - Yes  Risk Factor Category  - - High Fall Risk  Risk for fall due to : - - History of fall(s)    Cognitive Function: MMSE - Mini Mental State Exam 06/20/2017  Orientation to time 5  Orientation to Place 5  Registration 3  Attention/ Calculation 5  Recall 2  Language- name 2 objects 2  Language- repeat 1  Language- follow 3 step command 3  Language- read & follow direction 1  Write a sentence 1  Copy design 1  Total score 29        Screening Tests Health Maintenance  Topic Date Due  . TETANUS/TDAP  01/22/1942  . PNA vac Low Risk Adult (2 of 2 - PPSV23) 06/03/2016  . INFLUENZA VACCINE  03/29/2017        Plan:    I have personally reviewed and addressed the Medicare Annual Wellness questionnaire and have noted the following in the patient's chart:  A. Medical and social history B. Use of alcohol, tobacco or illicit drugs  C. Current medications and supplements D. Functional ability and status E.  Nutritional status F.  Physical activity G. Advance directives H. List of other physicians I.  Hospitalizations, surgeries, and ER visits in  previous 12 months J.  [place] to include hearing, vision, cognitive, depression L. Referrals and appointments - none  In addition, I have reviewed and discussed with patient certain preventive protocols, quality metrics, and best practice recommendations. A written personalized care plan for preventive services as well as general preventive health recommendations were provided to patient.  See attached scanned questionnaire for additional information.   Signed,   Rich Reining, RN Nurse Health Advisor   Quick Notes   Health Maintenance: TDAP and shingrix prescriptions written     Abnormal Screen: MMSE 29/30. Passed clock drawing     Patient Concerns: Requested a most form.     Nurse Concerns: None

## 2017-06-20 NOTE — Patient Instructions (Addendum)
[name] [name] , Thank you for taking time to come for your Medicare Wellness Visit. I appreciate your ongoing commitment to your health goals. Please review the following plan we discussed and let me know if I can assist you in the future.   Screening recommendations/referrals: Colonoscopy excluded you are over age 81 Recommended yearly ophthalmology/optometry visit for glaucoma screening and checkup Recommended yearly dental visit for hygiene and checkup  Vaccinations: Influenza vaccine up to date. Due 2019 fall season Pneumococcal vaccine up to date Tdap vaccine due,  Shingles vaccine due    Advanced directives: In Chart   Conditions/risks identified: None  Next appointment: Dr. Mariea Clonts 06/21/2017 @ 3pm  Preventive Care 32 Years and Older, Male Preventive care refers to lifestyle choices and visits with your health care provider that can promote health and wellness. What does preventive care include?  A yearly physical exam. This is also called an annual well check.  Dental exams once or twice a year.  Routine eye exams. Ask your health care provider how often you should have your eyes checked.  Personal lifestyle choices, including:  Daily care of your teeth and gums.  Regular physical activity.  Eating a healthy diet.  Avoiding tobacco and drug use.  Limiting alcohol use.  Practicing safe sex.  Taking low doses of aspirin every day.  Taking vitamin and mineral supplements as recommended by your health care provider. What happens during an annual well check? The services and screenings done by your health care provider during your annual well check will depend on your age, overall health, lifestyle risk factors, and family history of disease. Counseling  Your health care provider may ask you questions about your:  Alcohol use.  Tobacco use.  Drug use.  Emotional well-being.  Home and relationship well-being.  Sexual activity.  Eating habits.  History of  falls.  Memory and ability to understand (cognition).  Work and work Statistician. Screening  You may have the following tests or measurements:  Height, weight, and BMI.  Blood pressure.  Lipid and cholesterol levels. These may be checked every 5 years, or more frequently if you are over 64 years old.  Skin check.  Lung cancer screening. You may have this screening every year starting at age 73 if you have a 30-pack-year history of smoking and currently smoke or have quit within the past 15 years.  Fecal occult blood test (FOBT) of the stool. You may have this test every year starting at age 87.  Flexible sigmoidoscopy or colonoscopy. You may have a sigmoidoscopy every 5 years or a colonoscopy every 10 years starting at age 58.  Prostate cancer screening. Recommendations will vary depending on your family history and other risks.  Hepatitis C blood test.  Hepatitis B blood test.  Sexually transmitted disease (STD) testing.  Diabetes screening. This is done by checking your blood sugar (glucose) after you have not eaten for a while (fasting). You may have this done every 1-3 years.  Abdominal aortic aneurysm (AAA) screening. You may need this if you are a current or former smoker.  Osteoporosis. You may be screened starting at age 37 if you are at high risk. Talk with your health care provider about your test results, treatment options, and if necessary, the need for more tests. Vaccines  Your health care provider may recommend certain vaccines, such as:  Influenza vaccine. This is recommended every year.  Tetanus, diphtheria, and acellular pertussis (Tdap, Td) vaccine. You may need a Td booster every  10 years.  Zoster vaccine. You may need this after age 75.  Pneumococcal 13-valent conjugate (PCV13) vaccine. One dose is recommended after age 38.  Pneumococcal polysaccharide (PPSV23) vaccine. One dose is recommended after age 52. Talk to your health care provider about  which screenings and vaccines you need and how often you need them. This information is not intended to replace advice given to you by your health care provider. Make sure you discuss any questions you have with your health care provider. Document Released: 09/11/2015 Document Revised: 05/04/2016 Document Reviewed: 06/16/2015 Elsevier Interactive Patient Education  2017 [employer] Prevention in the Home Falls can cause injuries. They can happen to people of all ages. There are many things you can do to make your home safe and to help prevent falls. What can I do on the outside of my home?  Regularly fix the edges of walkways and driveways and fix any cracks.  Remove anything that might make you trip as you walk through a door, such as a raised step or threshold.  Trim any bushes or trees on the path to your home.  Use bright outdoor lighting.  Clear any walking paths of anything that might make someone trip, such as rocks or tools.  Regularly check to see if handrails are loose or broken. Make sure that both sides of any steps have handrails.  Any raised decks and porches should have guardrails on the edges.  Have any leaves, snow, or ice cleared regularly.  Use sand or salt on walking paths during winter.  Clean up any spills in your garage right away. This includes oil or grease spills. What can I do in the bathroom?  Use night lights.  Install grab bars by the toilet and in the tub and shower. Do not use towel bars as grab bars.  Use non-skid mats or decals in the tub or shower.  If you need to sit down in the shower, use a plastic, non-slip stool.  Keep the floor dry. Clean up any water that spills on the floor as soon as it happens.  Remove soap buildup in the tub or shower regularly.  Attach bath mats securely with double-sided non-slip rug tape.  Do not have throw rugs and other things on the floor that can make you trip. What can I do in the  bedroom?  Use night lights.  Make sure that you have a light by your bed that is easy to reach.  Do not use any sheets or blankets that are too big for your bed. They should not hang down onto the floor.  Have a firm chair that has side arms. You can use this for support while you get dressed.  Do not have throw rugs and other things on the floor that can make you trip. What can I do in the kitchen?  Clean up any spills right away.  Avoid walking on wet floors.  Keep items that you use a lot in easy-to-reach places.  If you need to reach something above you, use a strong step stool that has a grab bar.  Keep electrical cords out of the way.  Do not use floor polish or wax that makes floors slippery. If you must use wax, use non-skid floor wax.  Do not have throw rugs and other things on the floor that can make you trip. What can I do with my stairs?  Do not leave any items on the stairs.  Make sure  that there are handrails on both sides of the stairs and use them. Fix handrails that are broken or loose. Make sure that handrails are as long as the stairways.  Check any carpeting to make sure that it is firmly attached to the stairs. Fix any carpet that is loose or worn.  Avoid having throw rugs at the top or bottom of the stairs. If you do have throw rugs, attach them to the floor with carpet tape.  Make sure that you have a light switch at the top of the stairs and the bottom of the stairs. If you do not have them, ask someone to add them for you. What else can I do to help prevent falls?  Wear shoes that:  Do not have high heels.  Have rubber bottoms.  Are comfortable and fit you well.  Are closed at the toe. Do not wear sandals.  If you use a stepladder:  Make sure that it is fully opened. Do not climb a closed stepladder.  Make sure that both sides of the stepladder are locked into place.  Ask someone to hold it for you, if possible.  Clearly mark and make  sure that you can see:  Any grab bars or handrails.  First and last steps.  Where the edge of each step is.  Use tools that help you move around (mobility aids) if they are needed. These include:  Canes.  Walkers.  Scooters.  Crutches.  Turn on the lights when you go into a dark area. Replace any light bulbs as soon as they burn out.  Set up your furniture so you have a clear path. Avoid moving your furniture around.  If any of your floors are uneven, fix them.  If there are any pets around you, be aware of where they are.  Review your medicines with your doctor. Some medicines can make you feel dizzy. This can increase your chance of falling. Ask your doctor what other things that you can do to help prevent falls. This information is not intended to replace advice given to you by your health care provider. Make sure you discuss any questions you have with your health care provider. Document Released: 06/11/2009 Document Revised: 01/21/2016 Document Reviewed: 09/19/2014 Elsevier Interactive Patient Education  2017 [employer].

## 2017-06-21 ENCOUNTER — Encounter: Payer: Self-pay | Admitting: Internal Medicine

## 2017-06-21 ENCOUNTER — Non-Acute Institutional Stay: Payer: PPO | Admitting: Internal Medicine

## 2017-06-21 VITALS — BP 132/78 | HR 67 | Temp 98.1°F | Wt 188.0 lb

## 2017-06-21 DIAGNOSIS — E893 Postprocedural hypopituitarism: Secondary | ICD-10-CM

## 2017-06-21 DIAGNOSIS — R531 Weakness: Secondary | ICD-10-CM | POA: Diagnosis not present

## 2017-06-21 DIAGNOSIS — M5136 Other intervertebral disc degeneration, lumbar region: Secondary | ICD-10-CM

## 2017-06-21 DIAGNOSIS — E2740 Unspecified adrenocortical insufficiency: Secondary | ICD-10-CM | POA: Diagnosis not present

## 2017-06-21 DIAGNOSIS — N183 Chronic kidney disease, stage 3 unspecified: Secondary | ICD-10-CM

## 2017-06-21 DIAGNOSIS — M5126 Other intervertebral disc displacement, lumbar region: Secondary | ICD-10-CM

## 2017-06-21 DIAGNOSIS — E230 Hypopituitarism: Secondary | ICD-10-CM | POA: Diagnosis not present

## 2017-06-21 DIAGNOSIS — E7800 Pure hypercholesterolemia, unspecified: Secondary | ICD-10-CM | POA: Diagnosis not present

## 2017-06-21 DIAGNOSIS — Z9189 Other specified personal risk factors, not elsewhere classified: Secondary | ICD-10-CM | POA: Diagnosis not present

## 2017-06-21 DIAGNOSIS — H401190 Primary open-angle glaucoma, unspecified eye, stage unspecified: Secondary | ICD-10-CM | POA: Diagnosis not present

## 2017-06-21 DIAGNOSIS — D518 Other vitamin B12 deficiency anemias: Secondary | ICD-10-CM | POA: Diagnosis not present

## 2017-06-21 DIAGNOSIS — E236 Other disorders of pituitary gland: Secondary | ICD-10-CM | POA: Diagnosis not present

## 2017-06-21 DIAGNOSIS — M51369 Other intervertebral disc degeneration, lumbar region without mention of lumbar back pain or lower extremity pain: Secondary | ICD-10-CM

## 2017-06-21 DIAGNOSIS — E039 Hypothyroidism, unspecified: Secondary | ICD-10-CM

## 2017-06-21 NOTE — Progress Notes (Signed)
Location:  Occupational psychologist of Service:  Clinic (12)  Provider: Neil Brickell L. Mariea Clonts, D.O., C.M.D.  Code Status: DNR Goals of Care:  Advanced Directives 06/20/2017  Does Patient Have a Medical Advance Directive? Yes  Type of Paramedic of [place];Out of facility DNR (pink MOST or yellow form)  Does patient want to make changes to medical advance directive? No - Patient declined  Copy of [place] in Chart? Yes  Would patient like information on creating a medical advance directive? -  Pre-existing out of facility DNR order (yellow form or pink MOST form) Yellow form placed in chart (order not valid for inpatient use);Pink MOST form placed in chart (order not valid for inpatient use)   Chief Complaint  Patient presents with  . Medical Management of Chronic Issues    34mth follow-up    HPI: Patient is a 83 y.o. male seen today for medical management of chronic diseases.    Had an epidural done last week after one did not work in Sept.  Has a bulging disc L4-5.  He thinks this one has been more effective.  It affects his walking Has not been able to exercise as much and his balance is affected.  Using his cane and now back on exercise.  Using scooter for long distance.  Doing balance exercises from [place] and then wants to get back on machines.    Stopped driving due to prismatic issue with his eyes.  Has glaucoma.  Is going to have a "needling" in each eye with Dr[facility] Edilia Bo at [facility].  It did help the last time he had it.  Left eye 9 pressure, but up to 23 in right.  Was down to 14 but crept back up.  Drops had not been helpful before.  [facility] had referred him to [facility] for the prisms and he could not see at all.  Struggles more with reading than distance.  Reading on ipad instead where he can enlarge it.     Had labs with Dr. Elyse Hsu earlier this month for his adrenal  insuffiency/hypopituitarism/hypothyroidism/hypogonadism, CKD3--labs were good--I was sent a copy that I reviewed, but it's not yet scanned.  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  Needs stress dose steroids when ill.  Remains on B12 for deficiency.    On protonix due to prednisone use chronically, GI prophylaxis.  Hyperlipidemia:  4/18 labs with LDL 77, HDL 59, TG 71, total 145.  On pravachol.   Past Medical History:  Diagnosis Date  . Allergic rhinitis    Prior allergy shots 20 years  . Anemia   . Anemia, B12 deficiency 2000  . Arthritis    Status post left total replacement 8 2011  . BPH (benign prostatic hyperplasia) 2013  . Cancer ([place])    renal cell ca and skin cancer   . Colitis 2014  . Difficult intubation 1994   surgery had to be  stopped due to injury to "throat"  . Difficult intubation 1994   no trouble since.  Required nasotracheal intubation  '02 [facility] / ANESTHESIA RECORD FROM 2013 AND 2016 IN EPIC  . Glaucoma 2007   Status post left trabeculectomy 2007  . History of colon polyps    Colonoscopy 2001  . History of kidney stones   . History of renal cell carcinoma 1994   Status post right nephrectomy  . Hyperlipidemia 2003  . Hypertension   . 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 Hypothyroidism (acquired) 2000  . Nocturia   . Pituitary mass ([place]) 2000   S/p transphenoidal excision 05/1999 ([facility])  . Vitamin D deficiency 2009    Past Surgical History:  Procedure Laterality Date  . BRAIN SURGERY  2000   pituatary gland removed .  Marland Kitchen CHOLECYSTECTOMY  1984  . Ectropion surgery Bilateral 2006  . EYE SURGERY  over last 6 yrs.     trabeculectomy...   . EYE SURGERY   cat ext ou  . JOINT REPLACEMENT  2011   knee left  . KNEE ARTHROSCOPY Right 02/06/2015   Procedure: RIGHT ARTHROSCOPY KNEE WITH LATERAL MENSICAL  DEBRIDEMENT;  Surgeon: Gaynelle Arabian, MD;  Location: [facility];   Service: Orthopedics;  Laterality: Right;  . Mohs procedure      for skin cancer on nose   . NEPHRECTOMY Right 1994   Renal cell  . REVERSE SHOULDER ARTHROPLASTY  12/15/2011   Procedure: REVERSE SHOULDER ARTHROPLASTY;  Surgeon: Marin Shutter, MD;  Location: [facility];  Service: Orthopedics;  Laterality: Right;  right total reverse shoulder  . TONSILLECTOMY    . TOTAL KNEE ARTHROPLASTY Right 06/29/2015   Procedure: TOTAL KNEE ARTHROPLASTY;  Surgeon: Gaynelle Arabian, MD;  Location: [facility];  Service: Orthopedics;  Laterality: Right;  . Transsphenoidal excision pituitary tumor  05/1999   A.Tommi Rumps, M.D.([facility])    Allergies  Allergen Reactions  . Percocet [Oxycodone-Acetaminophen] Other (See Comments)    Just doesn't like it  . Robaxin [Methocarbamol]   . Diovan [Valsartan] Rash    Outpatient Encounter Prescriptions as of 06/21/2017  Medication Sig  . acetaminophen (TYLENOL) 325 MG tablet Take 2 tablets (650 mg total) by mouth every 6 (six) hours as needed for mild pain or fever.  . Cholecalciferol (VITAMIN D3) 5000 units TABS Take 1 tablet every other day  . Coenzyme Q10 300 MG CAPS Take 1 capsule by mouth daily.  . finasteride (PROSCAR) 5 MG tablet Take 5 mg by mouth every morning.   . Flaxseed, Linseed, (EQL FLAXSEED OIL) 1200 MG CAPS Take as directed once a day  . Multiple Vitamins-Minerals (MULTIVITAMIN & MINERAL PO) Take 1 tablet by mouth daily.  . Omega-3 Fatty Acids (FISH OIL) 1000 MG CAPS Take by mouth.  . pantoprazole (PROTONIX) 40 MG tablet Take 1 tablet once daily for gastroesophageal reflux  . pravastatin (PRAVACHOL) 80 MG tablet Take 80 mg by mouth every evening.   . predniSONE (DELTASONE) 5 MG tablet Take 1 tablet (5 mg) in the am and Take 1/2 tablet (5 mg) in the evening.  Marland Kitchen SYNTHROID 88 MCG tablet   . testosterone cypionate (DEPOTESTOSTERONE CYPIONATE) 200 MG/ML injection Inject 30 mg (0.15 ml) intramuscularly weekly. Must discard vial after 90 days.  . vitamin B-12  (CYANOCOBALAMIN) 1000 MCG tablet Take 1,000 mcg by mouth daily.  . [DISCONTINUED] amLODipine (NORVASC) 5 MG tablet Take 1 tablet (5 mg total) by mouth daily. (Patient not taking: Reported on 06/20/2017)   No facility-administered encounter medications on file as of 06/21/2017.     Review of Systems:  Review of Systems  Constitutional: Negative for chills, fever and malaise/fatigue.  HENT: Positive for hearing loss. Negative for congestion.        Hearing aids  Eyes: Positive for blurred vision.       Glasses  Respiratory: Negative for shortness of breath.   Cardiovascular: Negative for chest pain, palpitations and leg swelling.  Gastrointestinal: Negative for abdominal  pain, blood in stool, constipation and melena.  Genitourinary: Negative for dysuria.  Musculoskeletal: Negative for falls.  Skin: Negative for itching and rash.  Neurological: Positive for weakness. Negative for dizziness and loss of consciousness.  Endo/Heme/Allergies: Bruises/bleeds easily.  Psychiatric/Behavioral: Positive for memory loss. Negative for depression. The patient is not nervous/anxious and does not have insomnia.        Memory notably worse since last appt, more difficulty finding words and names especially    Health Maintenance  Topic Date Due  . TETANUS/TDAP  01/22/1942  . PNA vac Low Risk Adult (2 of 2 - PPSV23) 06/03/2016  . INFLUENZA VACCINE  03/29/2017    Physical Exam: Vitals:   06/21/17 1502  BP: 132/78  Pulse: 67  Temp: 98.1 F (36.7 C)  TempSrc: Oral  SpO2: 96%  Weight: 188 lb (85.3 kg)   Body mass index is 26.22 kg/m. Physical Exam  Constitutional: He is oriented to person, place, and time. He appears well-developed and well-nourished. No distress.  HENT:  Head: Normocephalic and atraumatic.  Hearing aids  Eyes:  glasses  Cardiovascular: Normal rate, regular rhythm, normal heart sounds and intact distal pulses.   Pulmonary/Chest: Effort normal and breath sounds normal. No  respiratory distress.  Abdominal: Bowel sounds are normal.  Musculoskeletal: Normal range of motion.  Uses cane  Neurological: He is alert and oriented to person, place, and time.  History not as reliable as in the past  Skin: Skin is warm and dry. Capillary refill takes less than 2 seconds.  Multiple ecchymoses of arms and legs (on prednisone chronically)  Psychiatric: He has a normal mood and affect.    Labs reviewed: Basic Metabolic Panel:  Recent Labs  10/12/16 1529 10/12/16 1916 10/13/16 0528 10/14/16 0509  NA 131*  --  133* 138  K 3.7  --  3.7 3.7  CL 94*  --  102 105  CO2 29  --  25 27  GLUCOSE 84  --  77 162*  BUN 18  --  23* 23*  CREATININE 1.46* 1.37* 1.46* 1.29*  CALCIUM 8.8*  --  7.7* 8.1*  MG  --   --  1.8 1.9  PHOS  --   --  4.0 2.6  TSH  --  <0.010*  --   --    Liver Function Tests:  Recent Labs  10/12/16 1529  AST 26  ALT 20  ALKPHOS 43  BILITOT 1.3*  PROT 6.5  ALBUMIN 3.6   No results for input(s): LIPASE, AMYLASE in the last 8760 hours. No results for input(s): AMMONIA in the last 8760 hours. CBC:  Recent Labs  10/12/16 1529 10/12/16 1916 10/13/16 0528 10/14/16 0509  WBC 13.0* 12.7* 12.0* 5.0  NEUTROABS 8.5*  --   --   --   HGB 13.6 13.4 12.0* 11.7*  HCT 40.6 39.9 36.4* 35.5*  MCV 98.3 97.8 98.4 96.2  PLT 187 167 159 151   Lipid Panel: See hpi  Dr. Darryl Nestle endocrine notes reviewed  Assessment/Plan 1. Bulging of intervertebral disc between L4 and L5 -s/p epidural with some improvement, cont cane use for balance and fall prevention  2. Primary open angle glaucoma, unspecified glaucoma stage, unspecified laterality -vision worse lately, has stopped driving, fortunate to have a friend who still does  3. 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 CKD (chronic kidney disease) stage 3, GFR 30-59 ml/min (HCC) -has been stable, Avoid nephrotoxic agents like nsaids, dose adjust  renally excreted  meds, hydrate.  5. Weakness -increasingly frail status at 89, cont supervised exercise program to maintain muscle and adequate protein in diet  6. Hypothyroidism (acquired) -cont current synthroid as per Dr. Elyse Hsu  7. Other vitamin B12 deficiency anemia -cont B12 supplement, had level within the year at endo  8. Adrenal insufficiency (HCC) -cont prednisone therapy per endo and use stress dose steroids in the event of acute illness -already had flu shot -need to check if he's had bone density due to this and #9  9. Pituitary hypogonadism ([place]) -cont testosterone repletion also per endo  10. At high risk for bleeding -cont protonix indefinitely due to prednisone use  11.  Pure hypercholesterolemia -had labs already this year -cont pravachol at this point, but if he should develop problems taking meds due to dysphagia or cognitive losses, it would be the first I'd stop  Labs/tests ordered:  No new Next appt:  11/01/2017  Lamanda Rudder L. Sondi Desch, D.O. [facility] [address], [place] [address] Cell Phone (Mon-Fri 8am-5pm):  [phone] On Call:  [phone] & follow prompts after 5pm & weekends Office Phone:  [phone] Office Fax:  [phone]

## 2017-06-23 DIAGNOSIS — E559 Vitamin D deficiency, unspecified: Secondary | ICD-10-CM | POA: Diagnosis not present

## 2017-06-23 DIAGNOSIS — I10 Essential (primary) hypertension: Secondary | ICD-10-CM | POA: Diagnosis not present

## 2017-06-23 DIAGNOSIS — E230 Hypopituitarism: Secondary | ICD-10-CM | POA: Diagnosis not present

## 2017-06-23 DIAGNOSIS — E7800 Pure hypercholesterolemia, unspecified: Secondary | ICD-10-CM | POA: Diagnosis not present

## 2017-07-01 ENCOUNTER — Encounter: Payer: Self-pay | Admitting: Internal Medicine

## 2017-07-01 DIAGNOSIS — M5136 Other intervertebral disc degeneration, lumbar region: Secondary | ICD-10-CM | POA: Insufficient documentation

## 2017-07-01 DIAGNOSIS — M5126 Other intervertebral disc displacement, lumbar region: Secondary | ICD-10-CM | POA: Insufficient documentation

## 2017-07-01 DIAGNOSIS — E2740 Unspecified adrenocortical insufficiency: Secondary | ICD-10-CM | POA: Insufficient documentation

## 2017-07-01 DIAGNOSIS — Z9189 Other specified personal risk factors, not elsewhere classified: Secondary | ICD-10-CM | POA: Insufficient documentation

## 2017-07-07 ENCOUNTER — Encounter: Payer: Self-pay | Admitting: Internal Medicine

## 2017-07-11 DIAGNOSIS — H401113 Primary open-angle glaucoma, right eye, severe stage: Secondary | ICD-10-CM | POA: Diagnosis not present

## 2017-07-11 DIAGNOSIS — H401133 Primary open-angle glaucoma, bilateral, severe stage: Secondary | ICD-10-CM | POA: Diagnosis not present

## 2017-07-11 DIAGNOSIS — I10 Essential (primary) hypertension: Secondary | ICD-10-CM | POA: Diagnosis not present

## 2017-07-12 DIAGNOSIS — I10 Essential (primary) hypertension: Secondary | ICD-10-CM | POA: Diagnosis not present

## 2017-07-12 DIAGNOSIS — E7800 Pure hypercholesterolemia, unspecified: Secondary | ICD-10-CM | POA: Diagnosis not present

## 2017-07-12 DIAGNOSIS — E559 Vitamin D deficiency, unspecified: Secondary | ICD-10-CM | POA: Diagnosis not present

## 2017-07-12 DIAGNOSIS — Z87891 Personal history of nicotine dependence: Secondary | ICD-10-CM | POA: Diagnosis not present

## 2017-07-12 DIAGNOSIS — E230 Hypopituitarism: Secondary | ICD-10-CM | POA: Diagnosis not present

## 2017-07-12 DIAGNOSIS — M7989 Other specified soft tissue disorders: Secondary | ICD-10-CM | POA: Diagnosis not present

## 2017-07-12 DIAGNOSIS — Z7952 Long term (current) use of systemic steroids: Secondary | ICD-10-CM | POA: Diagnosis not present

## 2017-07-12 DIAGNOSIS — E2740 Unspecified adrenocortical insufficiency: Secondary | ICD-10-CM | POA: Diagnosis not present

## 2017-07-12 DIAGNOSIS — H401133 Primary open-angle glaucoma, bilateral, severe stage: Secondary | ICD-10-CM | POA: Diagnosis not present

## 2017-07-12 DIAGNOSIS — D510 Vitamin B12 deficiency anemia due to intrinsic factor deficiency: Secondary | ICD-10-CM | POA: Diagnosis not present

## 2017-07-18 ENCOUNTER — Other Ambulatory Visit: Payer: Self-pay

## 2017-07-18 ENCOUNTER — Ambulatory Visit (INDEPENDENT_AMBULATORY_CARE_PROVIDER_SITE_OTHER): Payer: PPO | Admitting: Nurse Practitioner

## 2017-07-18 ENCOUNTER — Encounter: Payer: Self-pay | Admitting: Nurse Practitioner

## 2017-07-18 VITALS — BP 128/72 | HR 78 | Temp 98.3°F | Resp 17 | Ht 71.0 in | Wt 182.0 lb

## 2017-07-18 DIAGNOSIS — M7989 Other specified soft tissue disorders: Secondary | ICD-10-CM | POA: Diagnosis not present

## 2017-07-18 DIAGNOSIS — L03115 Cellulitis of right lower limb: Secondary | ICD-10-CM | POA: Diagnosis not present

## 2017-07-18 DIAGNOSIS — M79661 Pain in right lower leg: Secondary | ICD-10-CM

## 2017-07-18 DIAGNOSIS — R609 Edema, unspecified: Secondary | ICD-10-CM

## 2017-07-18 MED ORDER — DOXYCYCLINE HYCLATE 100 MG PO TABS: 100.0000 mg | ORAL_TABLET | Freq: Two times a day (BID) | ORAL | 0 refills | Status: DC

## 2017-07-18 NOTE — Patient Instructions (Signed)
appt made for [facility] tomorrow for Doppler of lower leg to see if there is a blood clot Go ahead and start doxycycline by mouth twice daily Would use probiotic by mouth twice daily while on antibiotic to prevent GI upset

## 2017-07-18 NOTE — Progress Notes (Signed)
Careteam: Patient Care Team: [name], DO as PCP - General (Geriatric Medicine) Community, [name], [name], MD as Referring Physician (Endocrinology)  Advanced Directive information Does Patient Have a Medical Advance Directive?: Yes, Type of Advance Directive: [place], Does patient want to make changes to medical advance directive?: No - Patient declined  Allergies  Allergen Reactions  . Percocet [Oxycodone-Acetaminophen] Other (See Comments)    Just doesn't like it  . Robaxin [Methocarbamol]   . Diovan [Valsartan] Rash    Chief Complaint  Patient presents with  . Acute Visit    Pt is being seen due to swelling/reddness to right lower leg for about 3 weeks. Red area on lower calf is hot to touch.      HPI: Patient is a 82 y.o. male seen in the office today due to swelling in right lower leg. Pt lives at [place] and sees Dr Mariea Clonts routinely. Hx of bulging disc, glaucoma, adrenal insuffiencey/hypopituitarism/hypothyroidism/hypogonadism, CKD3.  Pt reports mild swelling in past but in the last 3 weeks right lower leg has become increasingly swollen and red.  No injury to the leg, progressively getting bigger, redder and more tender  Denies pain but feels hot and tight Dr Alzheimer saw him and it was slightly swollen then but now much worse. He recommended a follow up with vascular and has appt in December for then but did not want to wait that long.  No fevers No recent extended travel.  Review of Systems:  Review of Systems  Constitutional: Negative for chills, fever and malaise/fatigue.  HENT: Positive for hearing loss. Negative for congestion.        Hearing aids  Eyes: Positive for blurred vision.       Glasses  Respiratory: Negative for cough and shortness of breath.   Cardiovascular: Positive for leg swelling. Negative for chest pain and palpitations.  Gastrointestinal: Negative for abdominal pain, blood in stool,  constipation and melena.  Musculoskeletal: Negative for falls and joint pain.  Skin: Negative for itching and rash.       Pain and redness to right lower leg  Neurological: Negative for dizziness, loss of consciousness and weakness.  Endo/Heme/Allergies: Bruises/bleeds easily.  Psychiatric/Behavioral: Positive for memory loss. Negative for depression. The patient is not nervous/anxious and does not have insomnia.     Past Medical History:  Diagnosis Date  . Allergic rhinitis    Prior allergy shots 20 years  . Anemia   . Anemia, B12 deficiency 2000  . Arthritis    Status post left total replacement 8 2011  . BPH (benign prostatic hyperplasia) 2013  . Cancer ([place])    renal cell ca and skin cancer   . Colitis 2014  . Difficult intubation 1994   surgery had to be  stopped due to injury to "throat"  . Difficult intubation 1994   no trouble since.  Required nasotracheal intubation  '02 [facility] / ANESTHESIA RECORD FROM 2013 AND 2016 IN EPIC  . Glaucoma 2007   Status post left trabeculectomy 2007  . History of colon polyps    Colonoscopy 2001  . History of kidney stones   . History of renal cell carcinoma 1994   Status post right nephrectomy  . Hyperlipidemia 2003  . Hypertension   . 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 Hypothyroidism (acquired) 2000  . Nocturia   . Pituitary mass ([place]) 2000   S/p transphenoidal excision 05/1999 (  [facility])  . Vitamin D deficiency 2009   Past Surgical History:  Procedure Laterality Date  . BRAIN SURGERY  2000   pituatary gland removed .  Marland Kitchen CHOLECYSTECTOMY  1984  . Ectropion surgery Bilateral 2006  . EYE SURGERY  over last 6 yrs.     trabeculectomy...   . EYE SURGERY   cat ext ou  . JOINT REPLACEMENT  2011   knee left  . Mohs procedure      for skin cancer on nose   . NEPHRECTOMY Right 1994   Renal cell  . REVERSE SHOULDER ARTHROPLASTY Right 12/15/2011   Performed by Marin Shutter, MD  at [facility] OR  . RIGHT ARTHROSCOPY KNEE WITH LATERAL MENSICAL  DEBRIDEMENT Right 02/06/2015   Performed by Gaynelle Arabian, MD at [facility] ORS  . TONSILLECTOMY    . TOTAL KNEE ARTHROPLASTY Right 06/29/2015   Performed by Gaynelle Arabian, MD at [facility] ORS  . Transsphenoidal excision pituitary tumor  05/1999   A.Tommi Rumps, M.D.([facility])   Social History:   reports that he quit smoking about 28 years ago. His smoking use included pipe. he has never used smokeless tobacco. He reports that he drinks alcohol. He reports that he does not use drugs.  Family History  Problem Relation Age of Onset  . Lung cancer Father   . Anesthesia problems Neg Hx   . Hypotension Neg Hx   . Malignant hyperthermia Neg Hx   . Pseudochol deficiency Neg Hx     Medications:   Medication List        Accurate as of 07/18/17 10:58 AM. Always use your most recent med list.          acetaminophen 325 MG tablet Commonly known as:  TYLENOL Take 2 tablets (650 mg total) by mouth every 6 (six) hours as needed for mild pain or fever.   Coenzyme Q10 300 MG Caps   EQL FLAXSEED OIL 1200 MG Caps   finasteride 5 MG tablet Commonly known as:  PROSCAR   Fish Oil 1000 MG Caps   MULTIVITAMIN & MINERAL PO   pantoprazole 40 MG tablet Commonly known as:  PROTONIX   pravastatin 80 MG tablet Commonly known as:  PRAVACHOL   predniSONE 5 MG tablet Commonly known as:  DELTASONE   SYNTHROID 88 MCG tablet Generic drug:  levothyroxine   testosterone cypionate 200 MG/ML injection Commonly known as:  DEPOTESTOSTERONE CYPIONATE   vitamin B-12 1000 MCG tablet Commonly known as:  CYANOCOBALAMIN   Vitamin D3 5000 units Tabs        Physical Exam:  Vitals:   07/18/17 1055  BP: 128/72  Pulse: 78  Resp: 17  Temp: 98.3 F (36.8 C)  TempSrc: Oral  SpO2: 98%  Weight: 182 lb (82.6 kg)  Height: 5\' 11"  (1.803 m)   Body mass index is 25.38 kg/m.  Physical Exam  Constitutional: He is oriented to person, place, and time. He  appears well-developed and well-nourished. No distress.  HENT:  Head: Normocephalic and atraumatic.  Hearing aids  Eyes:  glasses  Cardiovascular: Normal rate, regular rhythm, normal heart sounds and intact distal pulses.  Pulmonary/Chest: Effort normal and breath sounds normal. No respiratory distress.  Musculoskeletal: Normal range of motion. He exhibits edema and tenderness.  Uses cane Edema 2+ pitting on right, trace to left  erythema noted to anterior and medial aspect of lower leg.  Tenderness noted to posterior and anterior lower extremity   Neurological: He is alert and oriented to  person, place, and time.  Skin: Skin is warm and dry. Capillary refill takes less than 2 seconds. There is erythema.  Multiple ecchymoses of arms and legs (on prednisone chronically) Right lower leg with increase edema, tenderness and redness  Psychiatric: He has a normal mood and affect.    Labs reviewed: Basic Metabolic Panel: Recent Labs    10/12/16 1529 10/12/16 1916 10/13/16 0528 10/14/16 0509 06/12/17  NA 131*  --  133* 138 140  K 3.7  --  3.7 3.7 4.0  CL 94*  --  102 105  --   CO2 29  --  25 27  --   GLUCOSE 84  --  77 162*  --   BUN 18  --  23* 23* 25*  CREATININE 1.46* 1.37* 1.46* 1.29* 1.3  CALCIUM 8.8*  --  7.7* 8.1*  --   MG  --   --  1.8 1.9  --   PHOS  --   --  4.0 2.6  --   TSH  --  <0.010*  --   --   --    Liver Function Tests: Recent Labs    10/12/16 1529 06/12/17  AST 26 13*  ALT 20 14  ALKPHOS 43 46  BILITOT 1.3*  --   PROT 6.5  --   ALBUMIN 3.6  --    No results for input(s): LIPASE, AMYLASE in the last 8760 hours. No results for input(s): AMMONIA in the last 8760 hours. CBC: Recent Labs    10/12/16 1529 10/12/16 1916 10/13/16 0528 10/14/16 0509 06/12/17  WBC 13.0* 12.7* 12.0* 5.0 10.1  NEUTROABS 8.5*  --   --   --   --   HGB 13.6 13.4 12.0* 11.7* 13.7  HCT 40.6 39.9 36.4* 35.5* 41  MCV 98.3 97.8 98.4 96.2  --   PLT 187 167 159 151 254   Lipid  Panel: Recent Labs    06/12/17  CHOL 130  HDL 62  LDLCALC 65  TRIG 70   TSH: Recent Labs    10/12/16 1916  TSH <0.010*   A1C: No results found for: HGBA1C   Assessment/Plan 1. Pain and swelling of right lower leg - VAS [place] LOWER EXTREMITY VENOUS (DVT); to be done tomorrow AM first available appt.  -will also treat for cellulitis due to redness and swelling - doxycycline (VIBRA-TABS) 100 MG tablet; Take 1 tablet (100 mg total) by mouth 2 (two) times daily.  Dispense: 20 tablet; Refill: 0 -cont to elevate LE as tolerates.   2. Cellulitis of right lower extremity - doxycycline (VIBRA-TABS) 100 MG tablet; Take 1 tablet (100 mg total) by mouth 2 (two) times daily.  Dispense: 20 tablet; Refill: 0 To take with florastor twice daily   Raia Amico K. Harle Battiest  [facility] [phone] 8 am - 5 pm) [phone] (after hours)

## 2017-07-19 ENCOUNTER — Ambulatory Visit (HOSPITAL_COMMUNITY)
Admission: RE | Admit: 2017-07-19 | Discharge: 2017-07-19 | Disposition: A | Discharge status: Home / Self Care | Payer: PPO | Source: Ambulatory Visit | Attending: Nurse Practitioner | Admitting: Nurse Practitioner

## 2017-07-19 ENCOUNTER — Other Ambulatory Visit: Payer: Self-pay

## 2017-07-19 ENCOUNTER — Telehealth: Payer: Self-pay | Admitting: *Deleted

## 2017-07-19 DIAGNOSIS — M79661 Pain in right lower leg: Secondary | ICD-10-CM | POA: Diagnosis not present

## 2017-07-19 DIAGNOSIS — M7989 Other specified soft tissue disorders: Secondary | ICD-10-CM | POA: Diagnosis not present

## 2017-07-19 MED ORDER — SACCHAROMYCES BOULARDII 250 MG PO CAPS: 250.0000 mg | ORAL_CAPSULE | Freq: Two times a day (BID) | ORAL | 0 refills | Status: DC

## 2017-07-19 NOTE — Telephone Encounter (Signed)
Patient Notified and agreed. 

## 2017-07-19 NOTE — Telephone Encounter (Signed)
Please notify patient, he needs to cont doxycycline twice daily with leg elevated. Can use compression hose to leg to help bring the swelling down, to apply in am and remove at bedtime

## 2017-07-19 NOTE — Telephone Encounter (Signed)
Patient was advised during OV yesterday to take a probiotic with his antibiotic. Pt needed Rx sent to pharmacy for probiotic.

## 2017-07-19 NOTE — Progress Notes (Signed)
RLE venous duplex prelim: negative for DVT. Landry Mellow, RDMS, RVT  Called results to [place].

## 2017-07-19 NOTE — Telephone Encounter (Signed)
[name] [name] with Vascular at [facility] called with Verbal Report: Negative for DVT

## 2017-07-26 DIAGNOSIS — E291 Testicular hypofunction: Secondary | ICD-10-CM | POA: Diagnosis not present

## 2017-07-26 DIAGNOSIS — L570 Actinic keratosis: Secondary | ICD-10-CM | POA: Diagnosis not present

## 2017-07-26 DIAGNOSIS — N401 Enlarged prostate with lower urinary tract symptoms: Secondary | ICD-10-CM | POA: Diagnosis not present

## 2017-07-26 DIAGNOSIS — R351 Nocturia: Secondary | ICD-10-CM | POA: Diagnosis not present

## 2017-07-26 DIAGNOSIS — L720 Epidermal cyst: Secondary | ICD-10-CM | POA: Diagnosis not present

## 2017-07-26 DIAGNOSIS — Z85828 Personal history of other malignant neoplasm of skin: Secondary | ICD-10-CM | POA: Diagnosis not present

## 2017-08-10 ENCOUNTER — Other Ambulatory Visit: Payer: Self-pay | Admitting: *Deleted

## 2017-08-10 NOTE — Patient Outreach (Addendum)
[facility]) Care Management  08/10/2017  Barry Faircloth. [birth year] [id number]  SCREENING   RN spoke with pt today and completed the initial screening. RN explained the purpose for today's call and inquired further on pt's mobility. Pt states no falls within the past 3 months when screened and states he lives at [facility] with all the assistance he needs. Pt states how sociable he is the the other residents and has very supportive care both inside the outside the facility. Pt does not have any needs at this time however appreciative for the call.   Raina Mina, RN Care Management Coordinator [place] Office [phone]

## 2017-08-15 DIAGNOSIS — M79671 Pain in right foot: Secondary | ICD-10-CM | POA: Diagnosis not present

## 2017-08-15 DIAGNOSIS — L84 Corns and callosities: Secondary | ICD-10-CM | POA: Diagnosis not present

## 2017-08-15 DIAGNOSIS — M79672 Pain in left foot: Secondary | ICD-10-CM | POA: Diagnosis not present

## 2017-08-15 DIAGNOSIS — B351 Tinea unguium: Secondary | ICD-10-CM | POA: Diagnosis not present

## 2017-08-23 ENCOUNTER — Encounter (HOSPITAL_COMMUNITY): Payer: PPO

## 2017-08-23 ENCOUNTER — Encounter: Payer: PPO | Admitting: Surgery

## 2017-10-09 ENCOUNTER — Telehealth: Payer: Self-pay

## 2017-10-09 NOTE — Telephone Encounter (Signed)
Per verbal conversation with Dr.Reed patient to try Salonpas with Lidocaine OTC first. The Salonpas with Lidocaine is 1% weaker than the prescription. Prescription is not usually approved for back pain.   Discussed above with patient, patient verbalized understanding and plans to purchase Salonpas with Lidocaine OCT

## 2017-10-09 NOTE — Telephone Encounter (Signed)
Patient with pain in back as a results of bulging disc. Patient would like to try lidocaine patch (recommended by a friend)   Please advise if you are in agreement with patient's request, if so please send rx to [facility]

## 2017-10-10 DIAGNOSIS — R05 Cough: Secondary | ICD-10-CM | POA: Diagnosis not present

## 2017-10-10 DIAGNOSIS — R509 Fever, unspecified: Secondary | ICD-10-CM | POA: Diagnosis not present

## 2017-10-11 ENCOUNTER — Encounter: Payer: Self-pay | Admitting: Internal Medicine

## 2017-10-11 ENCOUNTER — Non-Acute Institutional Stay: Payer: PPO | Admitting: Internal Medicine

## 2017-10-11 VITALS — BP 150/80 | HR 69 | Temp 98.4°F | Wt 179.0 lb

## 2017-10-11 DIAGNOSIS — B9789 Other viral agents as the cause of diseases classified elsewhere: Secondary | ICD-10-CM | POA: Diagnosis not present

## 2017-10-11 DIAGNOSIS — S51811A Laceration without foreign body of right forearm, initial encounter: Secondary | ICD-10-CM

## 2017-10-11 DIAGNOSIS — S41112A Laceration without foreign body of left upper arm, initial encounter: Secondary | ICD-10-CM

## 2017-10-11 DIAGNOSIS — I998 Other disorder of circulatory system: Secondary | ICD-10-CM

## 2017-10-11 DIAGNOSIS — E2740 Unspecified adrenocortical insufficiency: Secondary | ICD-10-CM

## 2017-10-11 DIAGNOSIS — S51812A Laceration without foreign body of left forearm, initial encounter: Secondary | ICD-10-CM

## 2017-10-11 DIAGNOSIS — J069 Acute upper respiratory infection, unspecified: Secondary | ICD-10-CM

## 2017-10-11 DIAGNOSIS — T380X5A Adverse effect of glucocorticoids and synthetic analogues, initial encounter: Secondary | ICD-10-CM

## 2017-10-11 NOTE — Progress Notes (Signed)
Location:   [place] of Service:  Clinic (12)  Provider: Aerion Bagdasarian L. Mariea Clonts, D.O., C.M.D.  Code Status: DNR, MOST Goals of Care:  Advanced Directives 10/11/2017  Does Patient Have a Medical Advance Directive? Yes  Type of Paramedic of [place];Out of facility DNR (pink MOST or yellow form)  Does patient want to make changes to medical advance directive? No - Patient declined  Copy of [place] in Chart? Yes  Would patient like information on creating a medical advance directive? -  Pre-existing out of facility DNR order (yellow form or pink MOST form) Yellow form placed in chart (order not valid for inpatient use);Pink MOST form placed in chart (order not valid for inpatient use)   Chief Complaint  Patient presents with  . Acute Visit    skin tears, fall x6 days ago  . ACP    DNR, MOST, HCPOA    HPI: Patient is a 84 y.o. male seen today for an acute visit for two recent concerns:  Fever two days ago of 100 and fall 6 days ago with skin tears on bilateral forearms and left posterior upper arm.  He had just recovered from the left dorsal forearm skin tear when he fell in the new theatre.  The clinic nurse has been changing the dressings for him since last week.  They've been using xerofoam on the wounds and then wrapping with kerlix and coban.  He has gotten some kind of device that covers the dressings to keep them dry when bathing (only got for one arm and then the other arm got wet in the shower this am).  He reports he wanted me to see his wounds due to concern for fever two days ago and whether he was getting a wound infection.  He is on chronic steroids for adrenal insufficiency which is why his skin is so fragile.  He is unsteady, but uses a walker and his power scooter to get around.  Past Medical History:  Diagnosis Date  . Allergic rhinitis    Prior allergy shots 20 years  . Anemia   . Anemia, B12 deficiency 2000  . Arthritis     Status post left total replacement 8 2011  . BPH (benign prostatic hyperplasia) 2013  . Cancer ([place])    renal cell ca and skin cancer   . Colitis 2014  . Difficult intubation 1994   surgery had to be  stopped due to injury to "throat"  . Difficult intubation 1994   no trouble since.  Required nasotracheal intubation  '02 [facility] / ANESTHESIA RECORD FROM 2013 AND 2016 IN EPIC  . Glaucoma 2007   Status post left trabeculectomy 2007  . History of colon polyps    Colonoscopy 2001  . History of kidney stones   . History of renal cell carcinoma 1994   Status post right nephrectomy  . Hyperlipidemia 2003  . Hypertension   . 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 Hypothyroidism (acquired) 2000  . Nocturia   . Pituitary mass ([place]) 2000   S/p transphenoidal excision 05/1999 ([facility])  . Vitamin D deficiency 2009    Past Surgical History:  Procedure Laterality Date  . BRAIN SURGERY  2000   pituatary gland removed .  Marland Kitchen CHOLECYSTECTOMY  1984  . Ectropion surgery Bilateral 2006  . EYE SURGERY  over last 6 yrs.     trabeculectomy...   . EYE SURGERY  cat ext ou  . JOINT REPLACEMENT  2011   knee left  . KNEE ARTHROSCOPY Right 02/06/2015   Procedure: RIGHT ARTHROSCOPY KNEE WITH LATERAL MENSICAL  DEBRIDEMENT;  Surgeon: Gaynelle Arabian, MD;  Location: [facility] ORS;  Service: Orthopedics;  Laterality: Right;  . Mohs procedure      for skin cancer on nose   . NEPHRECTOMY Right 1994   Renal cell  . REVERSE SHOULDER ARTHROPLASTY  12/15/2011   Procedure: REVERSE SHOULDER ARTHROPLASTY;  Surgeon: Marin Shutter, MD;  Location: [place];  Service: Orthopedics;  Laterality: Right;  right total reverse shoulder  . TONSILLECTOMY    . TOTAL KNEE ARTHROPLASTY Right 06/29/2015   Procedure: TOTAL KNEE ARTHROPLASTY;  Surgeon: Gaynelle Arabian, MD;  Location: [facility] ORS;  Service: Orthopedics;  Laterality: Right;  . Transsphenoidal excision pituitary tumor  05/1999    A.Tommi Rumps, M.D.([facility])    Allergies  Allergen Reactions  . Percocet [Oxycodone-Acetaminophen] Other (See Comments)    Just doesn't like it  . Robaxin [Methocarbamol]   . Diovan [Valsartan] Rash    Outpatient Encounter Medications as of 10/11/2017  Medication Sig  . acetaminophen (TYLENOL) 325 MG tablet Take 2 tablets (650 mg total) by mouth every 6 (six) hours as needed for mild pain or fever.  . Cholecalciferol (VITAMIN D3) 5000 units TABS Take 1 tablet every other day  . Coenzyme Q10 300 MG CAPS Take 1 capsule by mouth daily.  . finasteride (PROSCAR) 5 MG tablet Take 5 mg by mouth every morning.   . Flaxseed, Linseed, (EQL FLAXSEED OIL) 1200 MG CAPS Take as directed once a day  . Multiple Vitamins-Minerals (MULTIVITAMIN & MINERAL PO) Take 1 tablet by mouth daily.  . Omega-3 Fatty Acids (FISH OIL) 1000 MG CAPS Take by mouth.  . pantoprazole (PROTONIX) 40 MG tablet Take 1 tablet once daily for gastroesophageal reflux  . pravastatin (PRAVACHOL) 80 MG tablet Take 80 mg by mouth every evening.   . predniSONE (DELTASONE) 5 MG tablet Take 1 tablet (5 mg) in the am and Take 1/2 tablet (5 mg) in the evening.  Marland Kitchen SYNTHROID 88 MCG tablet   . testosterone cypionate (DEPOTESTOSTERONE CYPIONATE) 200 MG/ML injection Inject 30 mg (0.15 ml) intramuscularly weekly. Must discard vial after 90 days.  . vitamin B-12 (CYANOCOBALAMIN) 1000 MCG tablet Take 1,000 mcg by mouth daily.  . [DISCONTINUED] doxycycline (VIBRA-TABS) 100 MG tablet Take 1 tablet (100 mg total) by mouth 2 (two) times daily.  . [DISCONTINUED] saccharomyces boulardii (FLORASTOR) 250 MG capsule Take 1 capsule (250 mg total) by mouth 2 (two) times daily.   No facility-administered encounter medications on file as of 10/11/2017.     Review of Systems:  Review of Systems  Constitutional: Negative for chills, fever and malaise/fatigue.  HENT: Positive for hearing loss. Negative for congestion.   Eyes:       Glasses  Respiratory: Negative  for cough and shortness of breath.   Cardiovascular: Negative for chest pain, palpitations and leg swelling.  Gastrointestinal: Negative for abdominal pain, blood in stool, constipation and melena.  Genitourinary: Negative for dysuria.  Musculoskeletal: Positive for falls.  Skin: Negative for itching and rash.       Bilateral dorsal forearm wounds and left posterior arm wound  Neurological: Negative for dizziness, loss of consciousness and weakness.  Psychiatric/Behavioral: Negative for depression.       Occasional confusion/delirium when ill    Health Maintenance  Topic Date Due  . TETANUS/TDAP  10/27/2008  . INFLUENZA VACCINE  Completed  .  PNA vac Low Risk Adult  Completed    Physical Exam: Vitals:   10/11/17 1031  BP: (!) 150/80  Pulse: 69  Temp: 98.4 F (36.9 C)  TempSrc: Oral  SpO2: 98%  Weight: 179 lb (81.2 kg)   Body mass index is 24.97 kg/m. Physical Exam  Constitutional: He is oriented to person, place, and time. He appears well-developed and well-nourished. No distress.  Cardiovascular: Normal rate, regular rhythm, normal heart sounds and intact distal pulses.  Pulmonary/Chest: Effort normal and breath sounds normal. No respiratory distress.  Abdominal: Bowel sounds are normal.  Musculoskeletal: Normal range of motion.  Neurological: He is alert and oriented to person, place, and time.  Skin:  Bilateral dorsal forearms with skin tears:  Right with skin hanging off of it and entire layer removed, fresh red skin beneath, moist, but no active bleeding; left with several small abrasions and skin tears on dorsal forearm; left posterior upper arm with smaller still bleeding laceration     Labs reviewed: Basic Metabolic Panel: Recent Labs    10/12/16 1529 10/12/16 1916 10/13/16 0528 10/14/16 0509 06/12/17  NA 131*  --  133* 138 140  K 3.7  --  3.7 3.7 4.0  CL 94*  --  102 105  --   CO2 29  --  25 27  --   GLUCOSE 84  --  77 162*  --   BUN 18  --  23* 23* 25*   CREATININE 1.46* 1.37* 1.46* 1.29* 1.3  CALCIUM 8.8*  --  7.7* 8.1*  --   MG  --   --  1.8 1.9  --   PHOS  --   --  4.0 2.6  --   TSH  --  <0.010*  --   --   --    Liver Function Tests: Recent Labs    10/12/16 1529 06/12/17  AST 26 13*  ALT 20 14  ALKPHOS 43 46  BILITOT 1.3*  --   PROT 6.5  --   ALBUMIN 3.6  --    No results for input(s): LIPASE, AMYLASE in the last 8760 hours. No results for input(s): AMMONIA in the last 8760 hours. CBC: Recent Labs    10/12/16 1529 10/12/16 1916 10/13/16 0528 10/14/16 0509 06/12/17  WBC 13.0* 12.7* 12.0* 5.0 10.1  NEUTROABS 8.5*  --   --   --   --   HGB 13.6 13.4 12.0* 11.7* 13.7  HCT 40.6 39.9 36.4* 35.5* 41  MCV 98.3 97.8 98.4 96.2  --   PLT 187 167 159 151 254   Lipid Panel: Recent Labs    06/12/17  CHOL 130  HDL 62  LDLCALC 65  TRIG 70   Assessment/Plan 1. Skin tear of forearm without complication, left, initial encounter -change from xerofoam to mepitel dressings due to moisture, then cover with kerlix and coban, use device to prevent wetting during showers  -also has area on dorsal hand with steristrips and bandaid over it -no signs of infection at any locations -coming to clinic for dressing changes every other day  2. Skin tear of forearm without complication, right, initial encounter -same directions for this arm--this is the worst one  3. Skin tear of left upper arm without complication, initial encounter -same directions for upper arm  4. Adrenal insufficiency (HCC) -cont steroids and when ill will need increased dose  5. Steroidal ecchymosis -ongoing and dermopathy due to steroids, but no alternatives possible  6. Viral URI with cough -  better now, no longer febrile, very little cough  Labs/tests ordered:  No new Next appt:  11/01/2017--keep as scheduled  Doni Bacha L. Langley Flatley, D.O. [facility] [address], [place] [address] Cell Phone (Mon-Fri 8am-5pm):   [phone] On Call:  [phone] & follow prompts after 5pm & weekends Office Phone:  [phone] Office Fax:  [phone]

## 2017-10-17 ENCOUNTER — Other Ambulatory Visit: Payer: Self-pay | Admitting: Internal Medicine

## 2017-10-18 ENCOUNTER — Non-Acute Institutional Stay: Payer: PPO | Admitting: Internal Medicine

## 2017-10-18 ENCOUNTER — Encounter: Payer: Self-pay | Admitting: Internal Medicine

## 2017-10-18 VITALS — BP 158/60 | HR 80 | Temp 97.9°F | Wt 180.0 lb

## 2017-10-18 DIAGNOSIS — S51811D Laceration without foreign body of right forearm, subsequent encounter: Secondary | ICD-10-CM | POA: Diagnosis not present

## 2017-10-18 DIAGNOSIS — I10 Essential (primary) hypertension: Secondary | ICD-10-CM | POA: Diagnosis not present

## 2017-10-18 DIAGNOSIS — L03115 Cellulitis of right lower limb: Secondary | ICD-10-CM | POA: Diagnosis not present

## 2017-10-18 DIAGNOSIS — S41112D Laceration without foreign body of left upper arm, subsequent encounter: Secondary | ICD-10-CM | POA: Diagnosis not present

## 2017-10-18 DIAGNOSIS — R5081 Fever presenting with conditions classified elsewhere: Secondary | ICD-10-CM

## 2017-10-18 DIAGNOSIS — S51812D Laceration without foreign body of left forearm, subsequent encounter: Secondary | ICD-10-CM

## 2017-10-18 MED ORDER — DOXYCYCLINE HYCLATE 100 MG PO TABS
100.0000 mg | ORAL_TABLET | Freq: Two times a day (BID) | ORAL | 0 refills | Status: DC
Start: 2017-10-18 — End: 2017-11-01

## 2017-10-18 NOTE — Progress Notes (Signed)
Location:  [facility] [facility] Service:  Clinic (12)  Provider: Michaeljoseph Revolorio L. Mariea Clonts, D.O., C.M.D.  Code Status: DNR Goals of Care:  Advanced Directives 10/18/2017  Does Patient Have a Medical Advance Directive? Yes  Type of Paramedic of [place];Out of facility DNR (pink MOST or yellow form)  Does patient want to make changes to medical advance directive? No - Patient declined  Copy of [place] in Chart? -  Would patient like information on creating a medical advance directive? -  Pre-existing out of facility DNR order (yellow form or pink MOST form) Yellow form placed in chart (order not valid for inpatient use);Pink MOST form placed in chart (order not valid for inpatient use)   Chief Complaint  Patient presents with  . Acute Visit    fever, check skin tear  . ACP    DNR, MOST, HCPOA    HPI: Patient is a 84 y.o. male seen today for an acute visit for fever and concern for cellulitis of his right leg which he had before November of last year.  He wants me to check his skin tears again today. Has run a low grade temp and had increased redness, warmth and swelling of his right lower leg the past few days.  Has had a few weeks of increased swelling w/o the redness, but didn't tell anyone about it.    Skin tears on left arm are nearly healed.  Right has some drainage, but no erythema or warmth, no increased pain.    BP up.  Recheck still high but improved.  Has skin tears on arms where cuff goes.    Past Medical History:  Diagnosis Date  . Allergic rhinitis    Prior allergy shots 20 years  . Anemia   . Anemia, B12 deficiency 2000  . Arthritis    Status post left total replacement 8 2011  . BPH (benign prostatic hyperplasia) 2013  . Cancer ([place])    renal cell ca and skin cancer   . Colitis 2014  . Difficult intubation 1994   surgery had to be  stopped due to injury to "throat"  . Difficult intubation 1994   no trouble since.   Required nasotracheal intubation  '02 [facility] / ANESTHESIA RECORD FROM 2013 AND 2016 IN EPIC  . Glaucoma 2007   Status post left trabeculectomy 2007  . History of colon polyps    Colonoscopy 2001  . History of kidney stones   . History of renal cell carcinoma 1994   Status post right nephrectomy  . Hyperlipidemia 2003  . Hypertension   . 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 Hypothyroidism (acquired) 2000  . Nocturia   . Pituitary mass ([place]) 2000   S/p transphenoidal excision 05/1999 ([facility])  . Vitamin D deficiency 2009    Past Surgical History:  Procedure Laterality Date  . BRAIN SURGERY  2000   pituatary gland removed .  Marland Kitchen CHOLECYSTECTOMY  1984  . Ectropion surgery Bilateral 2006  . EYE SURGERY  over last 6 yrs.     trabeculectomy...   . EYE SURGERY   cat ext ou  . JOINT REPLACEMENT  2011   knee left  . KNEE ARTHROSCOPY Right 02/06/2015   Procedure: RIGHT ARTHROSCOPY KNEE WITH LATERAL MENSICAL  DEBRIDEMENT;  Surgeon: Gaynelle Arabian, MD;  Location: [facility];  Service: Orthopedics;  Laterality: Right;  . Mohs procedure  for skin cancer on nose   . NEPHRECTOMY Right 1994   Renal cell  . REVERSE SHOULDER ARTHROPLASTY  12/15/2011   Procedure: REVERSE SHOULDER ARTHROPLASTY;  Surgeon: Marin Shutter, MD;  Location: [facility];  Service: Orthopedics;  Laterality: Right;  right total reverse shoulder  . TONSILLECTOMY    . TOTAL KNEE ARTHROPLASTY Right 06/29/2015   Procedure: TOTAL KNEE ARTHROPLASTY;  Surgeon: Gaynelle Arabian, MD;  [facility]: [facility];  Service: Orthopedics;  Laterality: Right;  . Transsphenoidal excision pituitary tumor  05/1999   A.Tommi Rumps, M.D.([facility])    Allergies  Allergen Reactions  . Percocet [Oxycodone-Acetaminophen] Other (See Comments)    Just doesn't like it  . Robaxin [Methocarbamol]   . Diovan [Valsartan] Rash    Outpatient Encounter Medications as of 10/18/2017  Medication Sig  . acetaminophen  (TYLENOL) 325 MG tablet Take 2 tablets (650 mg total) by mouth every 6 (six) hours as needed for mild pain or fever.  . Cholecalciferol (VITAMIN D3) 5000 units TABS Take 1 tablet every other day  . Coenzyme Q10 300 MG CAPS Take 1 capsule by mouth daily.  . finasteride (PROSCAR) 5 MG tablet Take 5 mg by mouth every morning.   . Flaxseed, Linseed, (EQL FLAXSEED OIL) 1200 MG CAPS Take as directed once a day  . Multiple Vitamins-Minerals (MULTIVITAMIN & MINERAL PO) Take 1 tablet by mouth daily.  . Omega-3 Fatty Acids (FISH OIL) 1000 MG CAPS Take by mouth.  . pantoprazole (PROTONIX) 40 MG tablet TAKE 1 TABLET BY MOUTH DAILY.  . pravastatin (PRAVACHOL) 80 MG tablet Take 80 mg by mouth every evening.   . predniSONE (DELTASONE) 5 MG tablet Take 1 tablet (5 mg) in the am and Take 1/2 tablet (5 mg) in the evening.  Marland Kitchen SYNTHROID 88 MCG tablet   . testosterone cypionate (DEPOTESTOSTERONE CYPIONATE) 200 MG/ML injection Inject 30 mg (0.15 ml) intramuscularly weekly. Must discard vial after 90 days.  . vitamin B-12 (CYANOCOBALAMIN) 1000 MCG tablet Take 1,000 mcg by mouth daily.   No facility-administered encounter medications on file as of 10/18/2017.     Review of Systems:  Review of Systems  Constitutional: Positive for fever and malaise/fatigue. Negative for chills.  HENT: Positive for congestion.   Eyes: Negative for blurred vision.       Glasses  Respiratory: Negative for cough and shortness of breath.        Had cough a couple weeks ago, but has gotten much better  Cardiovascular: Positive for leg swelling. Negative for chest pain, palpitations, orthopnea, claudication and PND.  Gastrointestinal: Negative for abdominal pain, blood in stool, constipation, diarrhea, heartburn, melena, nausea and vomiting.  Genitourinary: Negative for dysuria.  Musculoskeletal: Negative for falls and myalgias.       Uses cane and scooter  Skin: Negative for itching and rash.  Neurological: Negative for dizziness,  loss of consciousness and weakness.  Endo/Heme/Allergies: Bruises/bleeds easily.  Psychiatric/Behavioral: Positive for memory loss. Negative for depression. The patient is not nervous/anxious.     Health Maintenance  Topic Date Due  . TETANUS/TDAP  10/27/2008  . INFLUENZA VACCINE  Completed  . PNA vac Low Risk Adult  Completed    Physical Exam: Vitals:   10/18/17 1450  BP: (!) 180/70  Pulse: 80  Temp: 97.9 F (36.6 C)  TempSrc: Oral  SpO2: 93%  Weight: 180 lb (81.6 kg)   Body mass index is 25.1 kg/m. Physical Exam  Constitutional: He is oriented to person, place, and time. He appears well-developed and  well-nourished. No distress.  Eyes:  glasses  Cardiovascular: Normal rate, regular rhythm, normal heart sounds and intact distal pulses.  Pulmonary/Chest: Effort normal and breath sounds normal. No respiratory distress.  Musculoskeletal: Normal range of motion.  Neurological: He is alert and oriented to person, place, and time.  Skin: There is erythema.  Shiny erythema of right lower leg with warmth, no tenderness, some pitting 2+, much larger circumference that left, some mild leakage of fluid from right upper calf that I found during exam (he tried to tell me it was eucerin cream), left leg is atrophied and scarred from prior injury; has fungal toenails; left forearm skin tear on dorsum is dry now, no surrounding erythema, same with left upper arm; right forearm wounds still being dressed and remains moist, but again no erythema or warmth  Psychiatric: He has a normal mood and affect.    Labs reviewed: Basic Metabolic Panel: Recent Labs    06/12/17  NA 140  K 4.0  BUN 25*  CREATININE 1.3   Liver Function Tests: Recent Labs    06/12/17  AST 13*  ALT 14  ALKPHOS 46   No results for input(s): LIPASE, AMYLASE in the last 8760 hours. No results for input(s): AMMONIA in the last 8760 hours. CBC: Recent Labs    06/12/17  WBC 10.1  HGB 13.7  HCT 41  PLT 254    Lipid Panel: Recent Labs    06/12/17  CHOL 130  HDL 62  LDLCALC 65  TRIG 70  Assessment/Plan 1. Cellulitis of right anterior lower leg - had prior negative venous doppler in November that ruled out DVT and reports presentation is identical to that time, has low grade temp again - doxycycline (VIBRA-TABS) 100 MG tablet; Take 1 tablet (100 mg total) by mouth 2 (two) times daily.  Dispense: 20 tablet; Refill: 0 -elevate leg at rest, take yogurt with abx, notify me if not improving so he can be referred for venous doppler and possible arterial eval  2. Skin tear of left forearm without complication, subsequent encounter -now dry, cont to monitor, does not appear infected  3. Skin tear of right forearm without complication, subsequent encounter -still moist and getting dressing changes qod with clinic nurse--appeared clean yesterday  4. Skin tear of left upper arm without complication, subsequent encounter -now dry, cont to monitor, no sign of infection  5. Fever in other diseases -low grade -suspect due to cellulitis, but would check venous doppler if edema and erythema are not improving by end of week  6. Essential hypertension -bp elevated today--rushed here, site of bp cuff is tender, also, did improve with rest, cont to  monitor  Labs/tests ordered:  No orders of the defined types were placed in this encounter.  Next appt:  11/01/2017  Liseth Wann L. Damier Disano, D.O. [facility] [address], [place] [address] Cell Phone (Mon-Fri 8am-5pm):  [phone] On Call:  [phone] & follow prompts after 5pm & weekends Office Phone:  [phone] Office Fax:  [phone]

## 2017-10-18 NOTE — Patient Instructions (Signed)
Please elevate your right leg at rest.   Take doxycycline 100mg  twice daily for 10 days. While on the antibiotics, eat a yogurt daily. If you continue to run a low grade temp or the swelling is not improving, redness gets worse instead of better, let Mliss Sax know.  I would then recommend you be seen by the vascular specialist.

## 2017-10-24 DIAGNOSIS — B351 Tinea unguium: Secondary | ICD-10-CM | POA: Diagnosis not present

## 2017-10-24 DIAGNOSIS — D518 Other vitamin B12 deficiency anemias: Secondary | ICD-10-CM | POA: Diagnosis not present

## 2017-10-24 DIAGNOSIS — E230 Hypopituitarism: Secondary | ICD-10-CM | POA: Diagnosis not present

## 2017-10-24 DIAGNOSIS — M79672 Pain in left foot: Secondary | ICD-10-CM | POA: Diagnosis not present

## 2017-10-24 DIAGNOSIS — I10 Essential (primary) hypertension: Secondary | ICD-10-CM | POA: Diagnosis not present

## 2017-10-24 DIAGNOSIS — M79671 Pain in right foot: Secondary | ICD-10-CM | POA: Diagnosis not present

## 2017-10-24 DIAGNOSIS — E559 Vitamin D deficiency, unspecified: Secondary | ICD-10-CM | POA: Diagnosis not present

## 2017-10-24 DIAGNOSIS — D519 Vitamin B12 deficiency anemia, unspecified: Secondary | ICD-10-CM | POA: Diagnosis not present

## 2017-10-24 DIAGNOSIS — E7800 Pure hypercholesterolemia, unspecified: Secondary | ICD-10-CM | POA: Diagnosis not present

## 2017-10-24 DIAGNOSIS — L84 Corns and callosities: Secondary | ICD-10-CM | POA: Diagnosis not present

## 2017-10-30 DIAGNOSIS — E290 Testicular hyperfunction: Secondary | ICD-10-CM | POA: Diagnosis not present

## 2017-10-30 DIAGNOSIS — D510 Vitamin B12 deficiency anemia due to intrinsic factor deficiency: Secondary | ICD-10-CM | POA: Diagnosis not present

## 2017-10-30 DIAGNOSIS — E230 Hypopituitarism: Secondary | ICD-10-CM | POA: Diagnosis not present

## 2017-10-30 DIAGNOSIS — E7800 Pure hypercholesterolemia, unspecified: Secondary | ICD-10-CM | POA: Diagnosis not present

## 2017-11-01 ENCOUNTER — Encounter: Payer: Self-pay | Admitting: Internal Medicine

## 2017-11-01 ENCOUNTER — Non-Acute Institutional Stay: Payer: PPO | Admitting: Internal Medicine

## 2017-11-01 VITALS — BP 128/60 | HR 78 | Temp 97.8°F | Wt 179.0 lb

## 2017-11-01 DIAGNOSIS — S41112D Laceration without foreign body of left upper arm, subsequent encounter: Secondary | ICD-10-CM

## 2017-11-01 DIAGNOSIS — M5126 Other intervertebral disc displacement, lumbar region: Secondary | ICD-10-CM

## 2017-11-01 DIAGNOSIS — S51811D Laceration without foreign body of right forearm, subsequent encounter: Secondary | ICD-10-CM

## 2017-11-01 DIAGNOSIS — I998 Other disorder of circulatory system: Secondary | ICD-10-CM | POA: Diagnosis not present

## 2017-11-01 DIAGNOSIS — M51369 Other intervertebral disc degeneration, lumbar region without mention of lumbar back pain or lower extremity pain: Secondary | ICD-10-CM

## 2017-11-01 DIAGNOSIS — L03115 Cellulitis of right lower limb: Secondary | ICD-10-CM | POA: Diagnosis not present

## 2017-11-01 DIAGNOSIS — I10 Essential (primary) hypertension: Secondary | ICD-10-CM

## 2017-11-01 DIAGNOSIS — S51812D Laceration without foreign body of left forearm, subsequent encounter: Secondary | ICD-10-CM | POA: Diagnosis not present

## 2017-11-01 DIAGNOSIS — G8929 Other chronic pain: Secondary | ICD-10-CM | POA: Diagnosis not present

## 2017-11-01 DIAGNOSIS — E2740 Unspecified adrenocortical insufficiency: Secondary | ICD-10-CM

## 2017-11-01 DIAGNOSIS — M5442 Lumbago with sciatica, left side: Secondary | ICD-10-CM | POA: Diagnosis not present

## 2017-11-01 DIAGNOSIS — T380X5A Adverse effect of glucocorticoids and synthetic analogues, initial encounter: Secondary | ICD-10-CM

## 2017-11-01 DIAGNOSIS — M5136 Other intervertebral disc degeneration, lumbar region: Secondary | ICD-10-CM

## 2017-11-01 DIAGNOSIS — R58 Hemorrhage, not elsewhere classified: Secondary | ICD-10-CM

## 2017-11-01 MED ORDER — LIDOCAINE 5 % EX PTCH: 1.0000 | MEDICATED_PATCH | CUTANEOUS | 5 refills | Status: DC

## 2017-11-01 NOTE — Progress Notes (Signed)
Location:  [facility] clinic Provider:  Apollonia Amini L. Mariea Clonts, D.O., C.M.D.  Code Status: DNR Goals of Care:  Advanced Directives 10/18/2017  Does Patient Have a Medical Advance Directive? Yes  Type of Paramedic of [place];Out of facility DNR (pink MOST or yellow form)  Does patient want to make changes to medical advance directive? No - Patient declined  Copy of [place] in Chart? -  Would patient like information on creating a medical advance directive? -  Pre-existing out of facility DNR order (yellow form or pink MOST form) Yellow form placed in chart (order not valid for inpatient use);Pink MOST form placed in chart (order not valid for inpatient use)    Chief Complaint  Patient presents with  . Medical Management of Chronic Issues    65mth follow-up    HPI: Patient is a 84 y.o. male seen today for medical management of chronic diseases.    Right leg cellulitis better.  Completed doxycycline.  No longer having fever.  Something on his great toe like a callous that bothers him and he sees podiatry about it tomorrow--insists I do not need to see it.    Wants to exercise more but his back hurts him on the left side.  He's wanting lidoderm b/c salonpas is not helping.  Not taking regularly scheduled tylenol.  I suggested this.    He is drinking less alcohol.  He used to drink a consistent 1.5oz of scotch.  Now only ever few days.    He forgot his cane today when he rode over here on his scooter.Marland Kitchen    He asks about taking pravachol ongoing for cholesterol.  He does not have known CAD, PAD, CVD, or diabetes, but takes chronic steroids.      Past Medical History:  Diagnosis Date  . Allergic rhinitis    Prior allergy shots 20 years  . Anemia   . Anemia, B12 deficiency 2000  . Arthritis    Status post left total replacement 8 2011  . BPH (benign prostatic hyperplasia) 2013  . Cancer ([place])    renal cell ca and skin cancer   . Colitis 2014  .  Difficult intubation 1994   surgery had to be  stopped due to injury to "throat"  . Difficult intubation 1994   no trouble since.  Required nasotracheal intubation  '02 [facility] / ANESTHESIA RECORD FROM 2013 AND 2016 IN EPIC  . Glaucoma 2007   Status post left trabeculectomy 2007  . History of colon polyps    Colonoscopy 2001  . History of kidney stones   . History of renal cell carcinoma 1994   Status post right nephrectomy  . Hyperlipidemia 2003  . Hypertension   . 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 Hypothyroidism (acquired) 2000  . Nocturia   . Pituitary mass ([place]) 2000   S/p transphenoidal excision 05/1999 ([facility])  . Vitamin D deficiency 2009    Past Surgical History:  Procedure Laterality Date  . BRAIN SURGERY  2000   pituatary gland removed .  Marland Kitchen CHOLECYSTECTOMY  1984  . Ectropion surgery Bilateral 2006  . EYE SURGERY  over last 6 yrs.     trabeculectomy...   . EYE SURGERY   cat ext ou  . JOINT REPLACEMENT  2011   knee left  . KNEE ARTHROSCOPY Right 02/06/2015   Procedure: RIGHT ARTHROSCOPY KNEE WITH LATERAL MENSICAL  DEBRIDEMENT;  Surgeon: Gaynelle Arabian,  MD;  [facility]: [facility];  Service: Orthopedics;  Laterality: Right;  . Mohs procedure      for skin cancer on nose   . NEPHRECTOMY Right 1994   Renal cell  . REVERSE SHOULDER ARTHROPLASTY  12/15/2011   Procedure: REVERSE SHOULDER ARTHROPLASTY;  Surgeon: Marin Shutter, MD;  Location: [place];  Service: Orthopedics;  Laterality: Right;  right total reverse shoulder  . TONSILLECTOMY    . TOTAL KNEE ARTHROPLASTY Right 06/29/2015   Procedure: TOTAL KNEE ARTHROPLASTY;  Surgeon: Gaynelle Arabian, MD;  [facility];  Service: Orthopedics;  Laterality: Right;  . Transsphenoidal excision pituitary tumor  05/1999   A.Tommi Rumps, M.D.([facility])    Allergies  Allergen Reactions  . Percocet [Oxycodone-Acetaminophen] Other (See Comments)    Just doesn't like it  . Robaxin  [Methocarbamol]   . Diovan [Valsartan] Rash    Outpatient Encounter Medications as of 11/01/2017  Medication Sig  . acetaminophen (TYLENOL) 325 MG tablet Take 2 tablets (650 mg total) by mouth every 6 (six) hours as needed for mild pain or fever.  . Cholecalciferol (VITAMIN D3) 5000 units TABS Take 1 tablet every other day  . Coenzyme Q10 300 MG CAPS Take 1 capsule by mouth daily.  . finasteride (PROSCAR) 5 MG tablet Take 5 mg by mouth every morning.   . Flaxseed, Linseed, (EQL FLAXSEED OIL) 1200 MG CAPS Take as directed once a day  . Multiple Vitamins-Minerals (MULTIVITAMIN & MINERAL PO) Take 1 tablet by mouth daily.  . Omega-3 Fatty Acids (FISH OIL) 1000 MG CAPS Take by mouth.  . pantoprazole (PROTONIX) 40 MG tablet TAKE 1 TABLET BY MOUTH DAILY.  . pravastatin (PRAVACHOL) 80 MG tablet Take 80 mg by mouth every evening.   . predniSONE (DELTASONE) 5 MG tablet Take 1 tablet (5 mg) in the am and Take 1/2 tablet (5 mg) in the evening.  Marland Kitchen SYNTHROID 88 MCG tablet   . testosterone cypionate (DEPOTESTOSTERONE CYPIONATE) 200 MG/ML injection Inject 30 mg (0.15 ml) intramuscularly weekly. Must discard vial after 90 days.  Marland Kitchen TIMOPTIC OCUDOSE 0.5 % SOLN   . UNABLE TO FIND Serum drops in both eyes as directed as needed  . vitamin B-12 (CYANOCOBALAMIN) 1000 MCG tablet Take 1,000 mcg by mouth daily.  . [DISCONTINUED] Timolol Maleate PF 0.5 % SOLN Place 1 drop into the right eye daily.  . [DISCONTINUED] doxycycline (VIBRA-TABS) 100 MG tablet Take 1 tablet (100 mg total) by mouth 2 (two) times daily.   No facility-administered encounter medications on file as of 11/01/2017.     Review of Systems:  Review of Systems  Constitutional: Positive for malaise/fatigue. Negative for fever.  HENT: Positive for hearing loss.   Eyes:       Glasses  Respiratory: Negative for shortness of breath.   Cardiovascular: Positive for leg swelling. Negative for chest pain and palpitations.       Right leg remains swollen  and has been for months with negative doppler 2x now  Gastrointestinal: Negative for abdominal pain.  Genitourinary: Negative for dysuria.  Musculoskeletal: Positive for back pain. Negative for falls.       Unsteady, but not consistent with cane or walker use when off power scooter  Skin: Negative for itching and rash.  Neurological: Positive for weakness. Negative for dizziness and loss of consciousness.  Psychiatric/Behavioral: Positive for memory loss. The patient is nervous/anxious.     Health Maintenance  Topic Date Due  . TETANUS/TDAP  10/27/2008  . INFLUENZA VACCINE  Completed  .  PNA vac Low Risk Adult  Completed    Physical Exam: Vitals:   11/01/17 1353  BP: 128/60  Pulse: 78  Temp: 97.8 F (36.6 C)  TempSrc: Oral  SpO2: 98%  Weight: 179 lb (81.2 kg)   Body mass index is 24.97 kg/m. Physical Exam  Constitutional: He is oriented to person, place, and time. No distress.  Cardiovascular: Normal rate, regular rhythm, normal heart sounds and intact distal pulses.  Pulmonary/Chest: Effort normal and breath sounds normal. No respiratory distress.  Abdominal: Bowel sounds are normal.  Musculoskeletal: Normal range of motion. He exhibits no tenderness.  Neurological: He is alert and oriented to person, place, and time.  Skin: Skin is warm and dry. There is erythema.  Mild erythema persists right lower leg  Psychiatric: He has a normal mood and affect.    Labs reviewed: Basic Metabolic Panel: Recent Labs    06/12/17  NA 140  K 4.0  BUN 25*  CREATININE 1.3   Liver Function Tests: Recent Labs    06/12/17  AST 13*  ALT 14  ALKPHOS 46   No results for input(s): LIPASE, AMYLASE in the last 8760 hours. No results for input(s): AMMONIA in the last 8760 hours. CBC: Recent Labs    06/12/17  WBC 10.1  HGB 13.7  HCT 41  PLT 254   Lipid Panel: Recent Labs    06/12/17  CHOL 130  HDL 62  LDLCALC 65  TRIG 70   No results found for: HGBA1C  Procedures since  last visit: No results found.  Assessment/Plan 1. Chronic left-sided low back pain with left-sided sciatica - salonpas ineffective, tried lidoderm before and it was working--discussed that likely not covered by insurance for this diagnosis, but he still might want to get it so Rx sent to pharmacy--expecting PA to come to office - lidocaine (LIDODERM) 5 %; Place 1 patch onto the skin daily. Remove & Discard patch within 12 hours or as directed by MD  Dispense: 30 patch; Refill: 5  2. Cellulitis of right anterior lower leg - still appears mildly red but not noticeably warm now, has never been tender, completed doxy therapy  3. Skin tear of left forearm without complication, subsequent encounter - resolved  4. Skin tear of right forearm without complication, subsequent encounter - resolved  5. Skin tear of left upper arm without complication, subsequent encounter - resolved  6. Essential hypertension -bp at goal with current therapy, cont same regimen, get up slowly due to orthostasis and use cane at least when walking to prevent falls  7. Adrenal insufficiency (HCC) -ongoing, cont steroids, follows with Dr. Elyse Hsu from an endocrine perspective  8. Bulging of intervertebral disc between L4 and L5 - requests Rx strength lidoderm as salonpas not effective for him - lidocaine (LIDODERM) 5 %; Place 1 patch onto the skin daily. Remove & Discard patch within 12 hours or as directed by MD  Dispense: 30 patch; Refill: 5  9. Steroidal ecchymosis -ongoing as long as on steroids which will be forever for adrenal insufficiency  Labs/tests ordered:  No new Next appt:  03/07/2018   Jatorian Renault L. Markavious Micco, D.O. [facility] [address], [place] [address] Cell Phone (Mon-Fri 8am-5pm):  [phone] On Call:  [phone] & follow prompts after 5pm & weekends Office Phone:  [phone] Office Fax:  [phone]

## 2017-11-02 ENCOUNTER — Telehealth: Payer: Self-pay

## 2017-11-02 NOTE — Telephone Encounter (Signed)
I attempted to initiate PA via Covermymeds x 2 and the screen froze both times [facility] # [id number])  I then called [phone] to initate PA  Patient's ID [id number]  I spoke with representative, PA form will be faxed to office for completion then faxed back to insurance company for review.   Awaiting fax

## 2017-11-06 ENCOUNTER — Other Ambulatory Visit: Payer: Self-pay | Admitting: Internal Medicine

## 2017-11-06 ENCOUNTER — Encounter (HOSPITAL_BASED_OUTPATIENT_CLINIC_OR_DEPARTMENT_OTHER): Payer: PPO | Attending: Internal Medicine

## 2017-11-06 ENCOUNTER — Ambulatory Visit (HOSPITAL_COMMUNITY)
Admission: RE | Admit: 2017-11-06 | Discharge: 2017-11-06 | Disposition: A | Discharge status: Home / Self Care | Payer: PPO | Source: Ambulatory Visit | Attending: Internal Medicine | Admitting: Internal Medicine

## 2017-11-06 DIAGNOSIS — E11621 Type 2 diabetes mellitus with foot ulcer: Secondary | ICD-10-CM

## 2017-11-06 DIAGNOSIS — L97509 Non-pressure chronic ulcer of other part of unspecified foot with unspecified severity: Secondary | ICD-10-CM | POA: Diagnosis present

## 2017-11-06 DIAGNOSIS — L97511 Non-pressure chronic ulcer of other part of right foot limited to breakdown of skin: Secondary | ICD-10-CM | POA: Insufficient documentation

## 2017-11-06 DIAGNOSIS — M869 Osteomyelitis, unspecified: Secondary | ICD-10-CM

## 2017-11-06 DIAGNOSIS — I872 Venous insufficiency (chronic) (peripheral): Secondary | ICD-10-CM | POA: Diagnosis not present

## 2017-11-06 DIAGNOSIS — E1169 Type 2 diabetes mellitus with other specified complication: Secondary | ICD-10-CM

## 2017-11-06 DIAGNOSIS — M19071 Primary osteoarthritis, right ankle and foot: Secondary | ICD-10-CM | POA: Insufficient documentation

## 2017-11-06 DIAGNOSIS — L97516 Non-pressure chronic ulcer of other part of right foot with bone involvement without evidence of necrosis: Secondary | ICD-10-CM | POA: Diagnosis not present

## 2017-11-06 DIAGNOSIS — G9009 Other idiopathic peripheral autonomic neuropathy: Secondary | ICD-10-CM | POA: Diagnosis not present

## 2017-11-07 NOTE — Telephone Encounter (Signed)
Received Prior Authorization from [phone] Fax:[phone] for Lidocaine 5% patch. Placed form in Dr. Cyndi Lennert folder to review,fill out and sign.  KJ:[id number] RJPVG:[id number]

## 2017-11-10 NOTE — Telephone Encounter (Signed)
Received fax from [place] stating Lidocaine Prior Authorization is DENIED. Diagnoses of Low Back Pain with radiculopathy is not medically accepted.  Placed Denial in Dr. Cyndi Lennert folder to review.

## 2017-11-13 ENCOUNTER — Telehealth: Payer: Self-pay | Admitting: *Deleted

## 2017-11-13 DIAGNOSIS — Z87891 Personal history of nicotine dependence: Secondary | ICD-10-CM | POA: Insufficient documentation

## 2017-11-13 DIAGNOSIS — Z85828 Personal history of other malignant neoplasm of skin: Secondary | ICD-10-CM | POA: Diagnosis not present

## 2017-11-13 DIAGNOSIS — Z85528 Personal history of other malignant neoplasm of kidney: Secondary | ICD-10-CM | POA: Diagnosis not present

## 2017-11-13 DIAGNOSIS — Z96652 Presence of left artificial knee joint: Secondary | ICD-10-CM | POA: Diagnosis not present

## 2017-11-13 DIAGNOSIS — G9009 Other idiopathic peripheral autonomic neuropathy: Secondary | ICD-10-CM | POA: Diagnosis not present

## 2017-11-13 DIAGNOSIS — E039 Hypothyroidism, unspecified: Secondary | ICD-10-CM | POA: Insufficient documentation

## 2017-11-13 DIAGNOSIS — R609 Edema, unspecified: Secondary | ICD-10-CM | POA: Diagnosis not present

## 2017-11-13 DIAGNOSIS — I7300 Raynaud's syndrome without gangrene: Secondary | ICD-10-CM | POA: Diagnosis not present

## 2017-11-13 DIAGNOSIS — I10 Essential (primary) hypertension: Secondary | ICD-10-CM | POA: Diagnosis not present

## 2017-11-13 DIAGNOSIS — L97514 Non-pressure chronic ulcer of other part of right foot with necrosis of bone: Secondary | ICD-10-CM | POA: Diagnosis not present

## 2017-11-13 DIAGNOSIS — L97512 Non-pressure chronic ulcer of other part of right foot with fat layer exposed: Secondary | ICD-10-CM | POA: Diagnosis not present

## 2017-11-13 NOTE — Telephone Encounter (Signed)
Spoke with patient and advised that lidocaine 5% patch was denied. The request was denied because it is being used for an indication which is not approved or medially accepted, low back pain with radiculopathy. Patient understood.

## 2017-11-17 ENCOUNTER — Emergency Department (HOSPITAL_COMMUNITY): Payer: PPO

## 2017-11-17 ENCOUNTER — Inpatient Hospital Stay (HOSPITAL_COMMUNITY)
Admission: EM | Admit: 2017-11-17 | Discharge: 2017-11-19 | DRG: 872 | Disposition: A | Discharge status: Skilled Nursing Facility | Payer: PPO | Attending: Family Medicine | Admitting: Family Medicine

## 2017-11-17 ENCOUNTER — Encounter (HOSPITAL_COMMUNITY): Payer: Self-pay

## 2017-11-17 ENCOUNTER — Other Ambulatory Visit: Payer: Self-pay

## 2017-11-17 DIAGNOSIS — L03115 Cellulitis of right lower limb: Secondary | ICD-10-CM | POA: Diagnosis not present

## 2017-11-17 DIAGNOSIS — R7982 Elevated C-reactive protein (CRP): Secondary | ICD-10-CM | POA: Diagnosis present

## 2017-11-17 DIAGNOSIS — N183 Chronic kidney disease, stage 3 unspecified: Secondary | ICD-10-CM | POA: Diagnosis present

## 2017-11-17 DIAGNOSIS — E230 Hypopituitarism: Secondary | ICD-10-CM | POA: Diagnosis present

## 2017-11-17 DIAGNOSIS — Z7952 Long term (current) use of systemic steroids: Secondary | ICD-10-CM

## 2017-11-17 DIAGNOSIS — Z888 Allergy status to other drugs, medicaments and biological substances status: Secondary | ICD-10-CM

## 2017-11-17 DIAGNOSIS — Z85528 Personal history of other malignant neoplasm of kidney: Secondary | ICD-10-CM | POA: Diagnosis not present

## 2017-11-17 DIAGNOSIS — N400 Enlarged prostate without lower urinary tract symptoms: Secondary | ICD-10-CM | POA: Diagnosis not present

## 2017-11-17 DIAGNOSIS — Z86010 Personal history of colonic polyps: Secondary | ICD-10-CM

## 2017-11-17 DIAGNOSIS — E236 Other disorders of pituitary gland: Secondary | ICD-10-CM | POA: Diagnosis not present

## 2017-11-17 DIAGNOSIS — E2740 Unspecified adrenocortical insufficiency: Secondary | ICD-10-CM | POA: Diagnosis not present

## 2017-11-17 DIAGNOSIS — A419 Sepsis, unspecified organism: Principal | ICD-10-CM | POA: Diagnosis present

## 2017-11-17 DIAGNOSIS — Z85828 Personal history of other malignant neoplasm of skin: Secondary | ICD-10-CM

## 2017-11-17 DIAGNOSIS — E039 Hypothyroidism, unspecified: Secondary | ICD-10-CM | POA: Diagnosis not present

## 2017-11-17 DIAGNOSIS — Z87891 Personal history of nicotine dependence: Secondary | ICD-10-CM | POA: Diagnosis not present

## 2017-11-17 DIAGNOSIS — E785 Hyperlipidemia, unspecified: Secondary | ICD-10-CM | POA: Diagnosis not present

## 2017-11-17 DIAGNOSIS — M1990 Unspecified osteoarthritis, unspecified site: Secondary | ICD-10-CM | POA: Diagnosis present

## 2017-11-17 DIAGNOSIS — E559 Vitamin D deficiency, unspecified: Secondary | ICD-10-CM | POA: Diagnosis present

## 2017-11-17 DIAGNOSIS — Z87442 Personal history of urinary calculi: Secondary | ICD-10-CM | POA: Diagnosis not present

## 2017-11-17 DIAGNOSIS — Z885 Allergy status to narcotic agent status: Secondary | ICD-10-CM

## 2017-11-17 DIAGNOSIS — L97512 Non-pressure chronic ulcer of other part of right foot with fat layer exposed: Secondary | ICD-10-CM | POA: Diagnosis present

## 2017-11-17 DIAGNOSIS — H409 Unspecified glaucoma: Secondary | ICD-10-CM | POA: Diagnosis present

## 2017-11-17 DIAGNOSIS — L03116 Cellulitis of left lower limb: Secondary | ICD-10-CM | POA: Diagnosis not present

## 2017-11-17 DIAGNOSIS — I129 Hypertensive chronic kidney disease with stage 1 through stage 4 chronic kidney disease, or unspecified chronic kidney disease: Secondary | ICD-10-CM | POA: Diagnosis not present

## 2017-11-17 DIAGNOSIS — Z96653 Presence of artificial knee joint, bilateral: Secondary | ICD-10-CM | POA: Diagnosis present

## 2017-11-17 DIAGNOSIS — Z96611 Presence of right artificial shoulder joint: Secondary | ICD-10-CM | POA: Diagnosis not present

## 2017-11-17 DIAGNOSIS — Z79890 Hormone replacement therapy (postmenopausal): Secondary | ICD-10-CM

## 2017-11-17 DIAGNOSIS — Z905 Acquired absence of kidney: Secondary | ICD-10-CM | POA: Diagnosis not present

## 2017-11-17 DIAGNOSIS — R509 Fever, unspecified: Secondary | ICD-10-CM | POA: Diagnosis not present

## 2017-11-17 DIAGNOSIS — E893 Postprocedural hypopituitarism: Secondary | ICD-10-CM | POA: Diagnosis present

## 2017-11-17 DIAGNOSIS — Z66 Do not resuscitate: Secondary | ICD-10-CM | POA: Diagnosis present

## 2017-11-17 DIAGNOSIS — Z79899 Other long term (current) drug therapy: Secondary | ICD-10-CM

## 2017-11-17 DIAGNOSIS — Z9049 Acquired absence of other specified parts of digestive tract: Secondary | ICD-10-CM

## 2017-11-17 DIAGNOSIS — J9811 Atelectasis: Secondary | ICD-10-CM | POA: Diagnosis not present

## 2017-11-17 LAB — BASIC METABOLIC PANEL
Anion gap: 7 (ref 5–15)
BUN: 26 mg/dL — ABNORMAL HIGH (ref 6–20)
CALCIUM: 9.1 mg/dL (ref 8.9–10.3)
CHLORIDE: 105 mmol/L (ref 101–111)
CO2: 28 mmol/L (ref 22–32)
CREATININE: 1.37 mg/dL — AB (ref 0.61–1.24)
GFR calc non Af Amer: 43 mL/min — ABNORMAL LOW (ref >60)
GFR, EST AFRICAN AMERICAN: 50 mL/min — AB (ref >60)
GLUCOSE: 120 mg/dL — AB (ref 65–99)
Potassium: 3.6 mmol/L (ref 3.5–5.1)
Sodium: 140 mmol/L (ref 135–145)

## 2017-11-17 MED ORDER — SODIUM CHLORIDE 0.9 % IV BOLUS (SEPSIS)
1000.0000 mL | Freq: Once | INTRAVENOUS | Status: AC
  Administered 2017-11-18: 1000 mL via INTRAVENOUS

## 2017-11-17 MED ORDER — METHYLPREDNISOLONE SODIUM SUCC 125 MG IJ SOLR
125.0000 mg | Freq: Once | INTRAMUSCULAR | Status: AC
  Administered 2017-11-18: 125 mg via INTRAVENOUS
  Filled 2017-11-17: qty 2

## 2017-11-17 MED ORDER — SODIUM CHLORIDE 0.9 % IV BOLUS (SEPSIS)
500.0000 mL | Freq: Once | INTRAVENOUS | Status: AC
  Administered 2017-11-18: 500 mL via INTRAVENOUS

## 2017-11-17 MED ORDER — ACETAMINOPHEN 500 MG PO TABS
1000.0000 mg | ORAL_TABLET | Freq: Once | ORAL | Status: AC
  Administered 2017-11-17: 1000 mg via ORAL
  Filled 2017-11-17: qty 2

## 2017-11-17 MED ORDER — VANCOMYCIN HCL 10 G IV SOLR
1500.0000 mg | Freq: Once | INTRAVENOUS | Status: AC
  Administered 2017-11-17: 1500 mg via INTRAVENOUS
  Filled 2017-11-17: qty 1500

## 2017-11-17 MED ORDER — ACETAMINOPHEN 325 MG PO TABS: 650.0000 mg | ORAL_TABLET | Freq: Once | ORAL | Status: DC | PRN

## 2017-11-17 NOTE — ED Triage Notes (Signed)
Pt from [facility]. He is complaining of lethargy and malaise x3 days. He is febrile in triage. He also has a wound on his R leg/foot that is being followed by the wound center. Denies Pain. A&Ox4. Ambulated at home with a walker.

## 2017-11-17 NOTE — Progress Notes (Signed)
A consult was received from an ED physician for vancomycin per pharmacy dosing.  The patient's profile has been reviewed for ht/wt/allergies/indication/available labs.   A one time order has been placed for Vancomycin 1500 mg.  Further antibiotics/pharmacy consults should be ordered by admitting physician if indicated.                       Thank you, [name] 11/17/2017  10:45 PM

## 2017-11-17 NOTE — ED Notes (Signed)
Provider made aware of hypotension.

## 2017-11-17 NOTE — ED Provider Notes (Signed)
[facility] DEPT Provider Note   CSN: [id number] Arrival date & time: 11/17/17  2120     History   Chief Complaint Chief Complaint  Patient presents with  . Fever  . Wound Check    R leg    HPI [name] [name]. is a 84 y.o. male.  Patient presents to the emergency department with a chief complaint of fever.  He was sent to the emergency department by the nurse at his assisted living center after he reported that he was feeling poorly.  He has a known wound on the bottom of his right foot, for which he is seeing wound care.  His family members report that he has had recurrent bouts of cellulitis in his right lower extremity.  Reports mild pain, and associated nausea.  He denies any other associated symptoms.  He has not tried taking any antibiotics for this.  The history is provided by the patient. No language interpreter was used.    Past Medical History:  Diagnosis Date  . Allergic rhinitis    Prior allergy shots 20 years  . Anemia   . Anemia, B12 deficiency 2000  . Arthritis    Status post left total replacement 8 2011  . BPH (benign prostatic hyperplasia) 2013  . Cancer ([place])    renal cell ca and skin cancer   . Colitis 2014  . Difficult intubation 1994   surgery had to be  stopped due to injury to "throat"  . Difficult intubation 1994   no trouble since.  Required nasotracheal intubation  '02 [facility] / ANESTHESIA RECORD FROM 2013 AND 2016 IN EPIC  . Glaucoma 2007   Status post left trabeculectomy 2007  . History of colon polyps    Colonoscopy 2001  . History of kidney stones   . History of renal cell carcinoma 1994   Status post right nephrectomy  . Hyperlipidemia 2003  . Hypertension   . 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 Hypothyroidism (acquired) 2000  . Nocturia   . Pituitary mass ([place]) 2000   S/p transphenoidal excision 05/1999 ([facility])  . Vitamin D deficiency 2009      Patient Active Problem List   Diagnosis Date Noted  . Adrenal insufficiency ([place]) 07/01/2017  . Bulging of intervertebral disc between L4 and L5 07/01/2017  . At high risk for bleeding 07/01/2017  . Right buttock pain 02/08/2017  . Leukocytosis 10/12/2016  . HLD (hyperlipidemia) 04/20/2016  . BPH (benign prostatic hyperplasia) 12/08/2015  . CKD (chronic kidney disease) stage 3, GFR 30-59 ml/min (HCC) 11/27/2015  . S/P total knee arthroplasty 07/13/2015  . OA (osteoarthritis) of knee 06/29/2015  . Ectropion 05/06/2015  . Lateral meniscal tear 02/05/2015  . Hyponatremia 10/20/2013  . Weakness 10/19/2013  . Hypertension   . 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 Hypothyroidism (acquired)   . Anemia, B12 deficiency   . Meibomian gland disease 08/06/2013  . Primary open angle glaucoma 06/19/2013  . Arthritis of shoulder region, right 12/15/2011  . Exposure keratitis 08/17/2011  . Dry eye 07/13/2011    Past Surgical History:  Procedure Laterality Date  . BRAIN SURGERY  2000   pituatary gland removed .  Marland Kitchen CHOLECYSTECTOMY  1984  . Ectropion surgery Bilateral 2006  . EYE SURGERY  over last 6 yrs.     trabeculectomy...   . EYE SURGERY   cat ext ou  . JOINT REPLACEMENT  2011  knee left  . KNEE ARTHROSCOPY Right 02/06/2015   Procedure: RIGHT ARTHROSCOPY KNEE WITH LATERAL MENSICAL  DEBRIDEMENT;  Surgeon: Gaynelle Arabian, MD;  Location: [facility];  Service: Orthopedics;  Laterality: Right;  . Mohs procedure      for skin cancer on nose   . NEPHRECTOMY Right 1994   Renal cell  . REVERSE SHOULDER ARTHROPLASTY  12/15/2011   Procedure: REVERSE SHOULDER ARTHROPLASTY;  Surgeon: Marin Shutter, MD;  Location: [facility];  Service: Orthopedics;  Laterality: Right;  right total reverse shoulder  . TONSILLECTOMY    . TOTAL KNEE ARTHROPLASTY Right 06/29/2015   Procedure: TOTAL KNEE ARTHROPLASTY;  Surgeon: Gaynelle Arabian, MD;  Location: [facility];  Service: Orthopedics;  Laterality: Right;   . Transsphenoidal excision pituitary tumor  05/1999   A.Tommi Rumps, M.D.([facility])        Home Medications    Prior to Admission medications   Medication Sig Start Date End Date Taking? Authorizing Provider  acetaminophen (TYLENOL) 325 MG tablet Take 2 tablets (650 mg total) by mouth every 6 (six) hours as needed for mild pain or fever. 10/24/15   Bonnielee Haff, MD  Cholecalciferol (VITAMIN D3) 5000 units TABS Take 1 tablet every other day    [provider]  Coenzyme Q10 300 MG CAPS Take 1 capsule by mouth daily.    [provider]  finasteride (PROSCAR) 5 MG tablet Take 5 mg by mouth every morning.     [provider]  Flaxseed, Linseed, (EQL FLAXSEED OIL) 1200 MG CAPS Take as directed once a day    [provider]  lidocaine (LIDODERM) 5 % Place 1 patch onto the skin daily. Remove & Discard patch within 12 hours or as directed by MD 11/01/17   Mariea Clonts, Tiffany L, DO  Multiple Vitamins-Minerals (MULTIVITAMIN & MINERAL PO) Take 1 tablet by mouth daily.    [provider]  Omega-3 Fatty Acids (FISH OIL) 1000 MG CAPS Take by mouth.    [provider]  pantoprazole (PROTONIX) 40 MG tablet TAKE 1 TABLET BY MOUTH DAILY. 10/17/17   Reed, Tiffany L, DO  pravastatin (PRAVACHOL) 80 MG tablet Take 80 mg by mouth every evening.     [provider]  predniSONE (DELTASONE) 5 MG tablet Take 1 tablet (5 mg) in the am and Take 1/2 tablet (5 mg) in the evening.    [provider]  SYNTHROID 88 MCG tablet  12/26/16   [provider]  testosterone cypionate (DEPOTESTOSTERONE CYPIONATE) 200 MG/ML injection Inject 30 mg (0.15 ml) intramuscularly weekly. Must discard vial after 90 days. 01/25/17   [provider]  TIMOPTIC OCUDOSE 0.5 % SOLN  09/05/17   [provider]  UNABLE TO FIND Serum drops in both eyes as directed as needed    [provider]  vitamin B-12 (CYANOCOBALAMIN) 1000 MCG tablet Take 1,000 mcg by  mouth daily.    [provider]    Family History Family History  Problem Relation Age of Onset  . Lung cancer Father   . Anesthesia problems Neg Hx   . Hypotension Neg Hx   . Malignant hyperthermia Neg Hx   . Pseudochol deficiency Neg Hx     Social History Social History   Tobacco Use  . Smoking status: Former Smoker    Types: Pipe    Last attempt to quit: 09/26/1988    Years since quitting: 29.1  . Smokeless tobacco: Never Used  . Tobacco comment: about 20 years  Substance Use  Topics  . Alcohol use: Yes    Comment: 1.5 ounces of vodka nightly   . Drug use: No     Allergies   Percocet [oxycodone-acetaminophen]; Robaxin [methocarbamol]; and Diovan [valsartan]   Review of Systems Review of Systems  All other systems reviewed and are negative.    Physical Exam Updated Vital Signs BP (!) 150/64 (BP Location: Left Arm)   Pulse 95   Temp (!) 102.5 F (39.2 C) (Oral)   Resp 17   SpO2 96%   Physical Exam  Constitutional: He is oriented to person, place, and time. He appears well-developed and well-nourished.  HENT:  Head: Normocephalic and atraumatic.  Eyes: Pupils are equal, round, and reactive to light. Conjunctivae and EOM are normal. Right eye exhibits no discharge. Left eye exhibits no discharge. No scleral icterus.  Neck: Normal range of motion. Neck supple. No JVD present.  Cardiovascular: Normal rate, regular rhythm, normal heart sounds and intact distal pulses. Exam reveals no gallop and no friction rub.  No murmur heard. Pulmonary/Chest: Effort normal and breath sounds normal. No respiratory distress. He has no wheezes. He has no rales. He exhibits no tenderness.  Abdominal: Soft. He exhibits no distension and no mass. There is no tenderness. There is no rebound and no guarding.  Musculoskeletal: Normal range of motion. He exhibits no edema or tenderness.  Neurological: He is alert and oriented to person, place, and time.  Skin: Skin is warm and  dry.     Psychiatric: He has a normal mood and affect. His behavior is normal. Judgment and thought content normal.  Nursing note and vitals reviewed.    ED Treatments / Results  Labs (all labs ordered are listed, but only abnormal results are displayed) Labs Reviewed  CBC WITH DIFFERENTIAL/PLATELET - Abnormal; Notable for the following components:      Result Value   WBC 14.7 (*)    RBC 3.42 (*)    Hemoglobin 10.9 (*)    HCT 33.2 (*)    Neutro Abs 11.7 (*)    All other components within normal limits  BASIC METABOLIC PANEL - Abnormal; Notable for the following components:   Glucose, Bld 120 (*)    BUN 26 (*)    Creatinine, Ser 1.37 (*)    GFR calc non Af Amer 43 (*)    GFR calc Af Amer 50 (*)    All other components within normal limits  CULTURE, BLOOD (ROUTINE X 2)  CULTURE, BLOOD (ROUTINE X 2)  URINALYSIS, ROUTINE W REFLEX MICROSCOPIC  I-STAT CG4 LACTIC ACID, ED  I-STAT CG4 LACTIC ACID, ED    EKG None  Radiology Dg Chest 2 View  Result Date: 11/18/2017 CLINICAL DATA:  Initial evaluation for acute fever. EXAM: CHEST - 2 VIEW COMPARISON:  Prior radiograph from 10/13/2016. FINDINGS: Mild cardiomegaly, stable. Mediastinal silhouette within normal limits. Aortic atherosclerosis. Lungs are hypoinflated. Mild bibasilar atelectatic changes and/or scarring. No consolidative airspace disease. No pulmonary edema or significant pleural effusion. No pneumothorax. Right shoulder arthroplasty noted.  No acute osseous abnormality. IMPRESSION: 1. Shallow lung inflation with mild bibasilar atelectasis and/or scarring. 2. No other active cardiopulmonary disease. 3. Aortic atherosclerosis. Electronically Signed   By: Jeannine Boga M.D.   On: 11/18/2017 01:28   Dg Foot Complete Right  Result Date: 11/17/2017 CLINICAL DATA:  Leg cellulitis and foot wound. EXAM: RIGHT FOOT COMPLETE - 3+ VIEW COMPARISON:  None. FINDINGS: Apart from some interval increase in soft tissue swelling about  the base of the  great toe, no significant change in the osseous appearance of the foot with severe osteoarthritic joint space narrowing of the first MTP with subchondral degenerative cystic change. No bony sequestrum is noted. Advanced degenerative changes are present of the second third TMT articulations. No acute fracture or foreign body. No soft tissue gas. No soft tissue tophus. Shallow ulceration along the plantar aspect of the forefoot as before. IMPRESSION: Some progression of soft tissue swelling without frank bone destruction involving the forefoot and in particular the great toe. Redemonstration of severe osteoarthritis of the first MTP and mild to moderate degenerative change at the base of the second and third TMT joints. Electronically Signed   By: Ashley Royalty M.D.   On: 11/17/2017 23:22    Procedures Procedures (including critical care time) CRITICAL CARE Performed by: Montine Circle   Total critical care time: 41 minutes  Critical care time was exclusive of separately billable procedures and treating other patients.  Critical care was necessary to treat or prevent imminent or life-threatening deterioration.  Critical care was time spent personally by me on the following activities: development of treatment plan with patient and/or surrogate as well as nursing, discussions with consultants, evaluation of patient's response to treatment, examination of patient, obtaining history from patient or surrogate, ordering and performing treatments and interventions, ordering and review of laboratory studies, ordering and review of radiographic studies, pulse oximetry and re-evaluation of patient's condition.  Medications Ordered in ED Medications  acetaminophen (TYLENOL) tablet 650 mg (has no administration in time range)  acetaminophen (TYLENOL) tablet 1,000 mg (has no administration in time range)     Initial Impression / Assessment and Plan / ED Course  I have reviewed the triage  vital signs and the nursing notes.  Pertinent labs & imaging results that were available during my care of the patient were reviewed by me and considered in my medical decision making (see chart for details).     Patient with recurrent cellulitis to right lower extremity.  The cellulitic region extends from his foot to his knee.  It is warm to touch and erythematous.  He has a fever to 102.5.  We will start IV antibiotics, check labs, and obtain x-ray of right foot to evaluate wound.  2335 notified by RN that pressure decreased to 90s.  Will activate code sepsis.  Seen by and discussed with Dr. Laverta Baltimore, who agrees with plan for admission.  Recommends giving a dose of 125mg  solumedrol because patient takes chronic steroids.    Final Clinical Impressions(s) / ED Diagnoses   Final diagnoses:  Cellulitis of right lower extremity    ED Discharge Orders    None       Montine Circle, PA-C 11/18/17 0319    Margette Fast, MD 11/18/17 [phone]

## 2017-11-18 ENCOUNTER — Emergency Department (HOSPITAL_COMMUNITY): Payer: PPO

## 2017-11-18 DIAGNOSIS — L03115 Cellulitis of right lower limb: Secondary | ICD-10-CM | POA: Diagnosis present

## 2017-11-18 DIAGNOSIS — Z96611 Presence of right artificial shoulder joint: Secondary | ICD-10-CM | POA: Diagnosis present

## 2017-11-18 DIAGNOSIS — H409 Unspecified glaucoma: Secondary | ICD-10-CM | POA: Diagnosis present

## 2017-11-18 DIAGNOSIS — R509 Fever, unspecified: Secondary | ICD-10-CM | POA: Diagnosis present

## 2017-11-18 DIAGNOSIS — Z85528 Personal history of other malignant neoplasm of kidney: Secondary | ICD-10-CM | POA: Diagnosis not present

## 2017-11-18 DIAGNOSIS — L97512 Non-pressure chronic ulcer of other part of right foot with fat layer exposed: Secondary | ICD-10-CM

## 2017-11-18 DIAGNOSIS — A419 Sepsis, unspecified organism: Secondary | ICD-10-CM | POA: Diagnosis present

## 2017-11-18 DIAGNOSIS — Z96653 Presence of artificial knee joint, bilateral: Secondary | ICD-10-CM | POA: Diagnosis present

## 2017-11-18 DIAGNOSIS — Z9049 Acquired absence of other specified parts of digestive tract: Secondary | ICD-10-CM | POA: Diagnosis not present

## 2017-11-18 DIAGNOSIS — E785 Hyperlipidemia, unspecified: Secondary | ICD-10-CM | POA: Diagnosis present

## 2017-11-18 DIAGNOSIS — N183 Chronic kidney disease, stage 3 (moderate): Secondary | ICD-10-CM

## 2017-11-18 DIAGNOSIS — E236 Other disorders of pituitary gland: Secondary | ICD-10-CM | POA: Diagnosis not present

## 2017-11-18 DIAGNOSIS — Z905 Acquired absence of kidney: Secondary | ICD-10-CM | POA: Diagnosis not present

## 2017-11-18 DIAGNOSIS — Z85828 Personal history of other malignant neoplasm of skin: Secondary | ICD-10-CM | POA: Diagnosis not present

## 2017-11-18 DIAGNOSIS — Z87891 Personal history of nicotine dependence: Secondary | ICD-10-CM | POA: Diagnosis not present

## 2017-11-18 DIAGNOSIS — R7982 Elevated C-reactive protein (CRP): Secondary | ICD-10-CM | POA: Diagnosis present

## 2017-11-18 DIAGNOSIS — Z86010 Personal history of colonic polyps: Secondary | ICD-10-CM | POA: Diagnosis not present

## 2017-11-18 DIAGNOSIS — Z87442 Personal history of urinary calculi: Secondary | ICD-10-CM | POA: Diagnosis not present

## 2017-11-18 DIAGNOSIS — Z66 Do not resuscitate: Secondary | ICD-10-CM | POA: Diagnosis present

## 2017-11-18 DIAGNOSIS — I129 Hypertensive chronic kidney disease with stage 1 through stage 4 chronic kidney disease, or unspecified chronic kidney disease: Secondary | ICD-10-CM | POA: Diagnosis present

## 2017-11-18 DIAGNOSIS — E2740 Unspecified adrenocortical insufficiency: Secondary | ICD-10-CM | POA: Diagnosis present

## 2017-11-18 DIAGNOSIS — E230 Hypopituitarism: Secondary | ICD-10-CM | POA: Diagnosis present

## 2017-11-18 DIAGNOSIS — N400 Enlarged prostate without lower urinary tract symptoms: Secondary | ICD-10-CM | POA: Diagnosis present

## 2017-11-18 DIAGNOSIS — E039 Hypothyroidism, unspecified: Secondary | ICD-10-CM | POA: Diagnosis present

## 2017-11-18 DIAGNOSIS — E559 Vitamin D deficiency, unspecified: Secondary | ICD-10-CM | POA: Diagnosis present

## 2017-11-18 DIAGNOSIS — M1990 Unspecified osteoarthritis, unspecified site: Secondary | ICD-10-CM | POA: Diagnosis present

## 2017-11-18 LAB — CBC WITH DIFFERENTIAL/PLATELET
BASOS ABS: 0 10*3/uL (ref 0.0–0.1)
Basophils Relative: 0 %
EOS PCT: 1 %
Eosinophils Absolute: 0.1 10*3/uL (ref 0.0–0.7)
HEMATOCRIT: 33.2 % — AB (ref 39.0–52.0)
HEMOGLOBIN: 10.9 g/dL — AB (ref 13.0–17.0)
LYMPHS ABS: 1.9 10*3/uL (ref 0.7–4.0)
LYMPHS PCT: 13 %
MCH: 31.9 pg (ref 26.0–34.0)
MCHC: 32.8 g/dL (ref 30.0–36.0)
MCV: 97.1 fL (ref 78.0–100.0)
MONOS PCT: 7 %
Monocytes Absolute: 1 10*3/uL (ref 0.1–1.0)
NEUTROS PCT: 79 %
Neutro Abs: 11.7 10*3/uL — ABNORMAL HIGH (ref 1.7–7.7)
Platelets: 270 10*3/uL (ref 150–400)
RBC: 3.42 MIL/uL — AB (ref 4.22–5.81)
RDW: 14.8 % (ref 11.5–15.5)
WBC: 14.7 10*3/uL — AB (ref 4.0–10.5)

## 2017-11-18 LAB — HEMOGLOBIN A1C
HEMOGLOBIN A1C: 6.2 % — AB (ref 4.8–5.6)
MEAN PLASMA GLUCOSE: 131.24 mg/dL

## 2017-11-18 LAB — C-REACTIVE PROTEIN: CRP: 8.5 mg/dL — AB (ref <1.0)

## 2017-11-18 LAB — HIV ANTIBODY (ROUTINE TESTING W REFLEX): HIV SCREEN 4TH GENERATION: NONREACTIVE

## 2017-11-18 LAB — SEDIMENTATION RATE: Sed Rate: 57 mm/hr — ABNORMAL HIGH (ref 0–16)

## 2017-11-18 LAB — PREALBUMIN: PREALBUMIN: 14.7 mg/dL — AB (ref 18–38)

## 2017-11-18 LAB — I-STAT CG4 LACTIC ACID, ED: Lactic Acid, Venous: 0.71 mmol/L (ref 0.5–1.9)

## 2017-11-18 MED ORDER — FINASTERIDE 5 MG PO TABS
5.0000 mg | ORAL_TABLET | Freq: Every morning | ORAL | Status: DC
  Administered 2017-11-18 – 2017-11-19 (×2): 5 mg via ORAL
  Filled 2017-11-18 (×2): qty 1

## 2017-11-18 MED ORDER — ENOXAPARIN SODIUM 40 MG/0.4ML ~~LOC~~ SOLN
40.0000 mg | SUBCUTANEOUS | Status: DC
  Administered 2017-11-18 – 2017-11-19 (×2): 40 mg via SUBCUTANEOUS
  Filled 2017-11-18 (×2): qty 0.4

## 2017-11-18 MED ORDER — OMEGA-3-ACID ETHYL ESTERS 1 G PO CAPS
1000.0000 mg | ORAL_CAPSULE | Freq: Every day | ORAL | Status: DC
  Administered 2017-11-18 – 2017-11-19 (×2): 1000 mg via ORAL
  Filled 2017-11-18 (×2): qty 1

## 2017-11-18 MED ORDER — ACETAMINOPHEN 325 MG PO TABS: 650.0000 mg | ORAL_TABLET | Freq: Four times a day (QID) | ORAL | Status: DC | PRN

## 2017-11-18 MED ORDER — PREDNISONE 5 MG PO TABS
15.0000 mg | ORAL_TABLET | Freq: Every day | ORAL | Status: DC
  Administered 2017-11-18 – 2017-11-19 (×2): 15 mg via ORAL
  Filled 2017-11-18 (×2): qty 1

## 2017-11-18 MED ORDER — SODIUM CHLORIDE 0.9 % IV SOLN
2.0000 g | Freq: Every day | INTRAVENOUS | Status: DC
  Administered 2017-11-18 – 2017-11-19 (×2): 2 g via INTRAVENOUS
  Filled 2017-11-18: qty 2
  Filled 2017-11-18: qty 20

## 2017-11-18 MED ORDER — METRONIDAZOLE 500 MG PO TABS
500.0000 mg | ORAL_TABLET | Freq: Three times a day (TID) | ORAL | Status: DC
  Administered 2017-11-18 – 2017-11-19 (×4): 500 mg via ORAL
  Filled 2017-11-18 (×4): qty 1

## 2017-11-18 MED ORDER — PRAVASTATIN SODIUM 40 MG PO TABS
80.0000 mg | ORAL_TABLET | Freq: Every evening | ORAL | Status: DC
  Administered 2017-11-18: 80 mg via ORAL
  Filled 2017-11-18 (×2): qty 2
  Filled 2017-11-18: qty 4

## 2017-11-18 MED ORDER — PANTOPRAZOLE SODIUM 40 MG PO TBEC
40.0000 mg | DELAYED_RELEASE_TABLET | Freq: Every day | ORAL | Status: DC
  Administered 2017-11-18: 40 mg via ORAL
  Filled 2017-11-18 (×2): qty 1

## 2017-11-18 MED ORDER — VITAMIN D3 25 MCG (1000 UNIT) PO TABS
5000.0000 [IU] | ORAL_TABLET | Freq: Every day | ORAL | Status: DC
  Administered 2017-11-18 – 2017-11-19 (×2): 5000 [IU] via ORAL
  Filled 2017-11-18 (×2): qty 5

## 2017-11-18 MED ORDER — PRO-STAT SUGAR FREE PO LIQD
30.0000 mL | Freq: Two times a day (BID) | ORAL | Status: DC
  Administered 2017-11-18 – 2017-11-19 (×2): 30 mL via ORAL
  Filled 2017-11-18 (×3): qty 30

## 2017-11-18 MED ORDER — ONDANSETRON HCL 4 MG/2ML IJ SOLN: 4.0000 mg | Freq: Four times a day (QID) | INTRAMUSCULAR | Status: DC | PRN

## 2017-11-18 MED ORDER — ADULT MULTIVITAMIN LIQUID CH
Freq: Every day | ORAL | Status: DC
  Administered 2017-11-18 – 2017-11-19 (×2): 15 mL via ORAL
  Filled 2017-11-18 (×2): qty 15

## 2017-11-18 MED ORDER — PREDNISONE 5 MG PO TABS
7.5000 mg | ORAL_TABLET | Freq: Every day | ORAL | Status: DC
  Administered 2017-11-18: 7.5 mg via ORAL
  Filled 2017-11-18 (×2): qty 1

## 2017-11-18 MED ORDER — LEVOTHYROXINE SODIUM 88 MCG PO TABS
88.0000 ug | ORAL_TABLET | Freq: Every day | ORAL | Status: DC
  Administered 2017-11-18 – 2017-11-19 (×2): 88 ug via ORAL
  Filled 2017-11-18 (×2): qty 1

## 2017-11-18 MED ORDER — ACETAMINOPHEN 650 MG RE SUPP: 650.0000 mg | Freq: Four times a day (QID) | RECTAL | Status: DC | PRN

## 2017-11-18 MED ORDER — VANCOMYCIN HCL 10 G IV SOLR
1250.0000 mg | INTRAVENOUS | Status: DC
  Administered 2017-11-18: 1250 mg via INTRAVENOUS
  Filled 2017-11-18: qty 1250

## 2017-11-18 MED ORDER — VITAMIN B-12 1000 MCG PO TABS
1000.0000 ug | ORAL_TABLET | Freq: Every day | ORAL | Status: DC
Start: 2017-11-18 — End: 2017-11-19
  Administered 2017-11-18 – 2017-11-19 (×2): 1000 ug via ORAL
  Filled 2017-11-18 (×2): qty 1

## 2017-11-18 MED ORDER — ONDANSETRON HCL 4 MG PO TABS: 4.0000 mg | ORAL_TABLET | Freq: Four times a day (QID) | ORAL | Status: DC | PRN

## 2017-11-18 MED ORDER — PIPERACILLIN-TAZOBACTAM 3.375 G IVPB 30 MIN
3.3750 g | INTRAVENOUS | Status: AC
  Administered 2017-11-18: 3.375 g via INTRAVENOUS
  Filled 2017-11-18: qty 50

## 2017-11-18 NOTE — ED Notes (Signed)
When patient was first seen by PA, patient's temp was 102.5 but his BP was stable. IV vancomycin was ordered and hung for cellulitis in his right lower leg. Patient's BP did not drop until after the IV vancomycin was ordered and hung. Code sepsis was then called. Therefore, blood cultures were not drawn before IV vancomycin was hung.

## 2017-11-18 NOTE — Progress Notes (Addendum)
Patient arrived on the unit at approximately 42. He is alert and verbally responsive and voces no complaints at this time. Patient has chronic wound to right metatarsal head and has been going to the wound center. Had a unna boot which was removed in the ED. Wound is dry and periwound intact with no erythema noted. Denies pain or tenderness to area on palpation.

## 2017-11-18 NOTE — Progress Notes (Signed)
A consult was received from an ED physician for zosyn per pharmacy dosing.  The patient's profile has been reviewed for ht/wt/allergies/indication/available labs.   A one time order has been placed for Zosyn 3.375 Gm.  Further antibiotics/pharmacy consults should be ordered by admitting physician if indicated.                       Thank you, [name] 11/18/2017  12:15 AM

## 2017-11-18 NOTE — Progress Notes (Signed)
Pharmacy Antibiotic Note  [name] [name]. is a 84 y.o. male admitted on 11/17/2017 with diabetic foot infection.  Pharmacy has been consulted for vancomycin dosing.  Plan: Vancomycin 1500 mg x1 then 1250 mg IV q36h for est AUC = 479 Goal AUC = 400-500 Rocephin 2 Gm IV q24h (MD) Flagyl 500 mg IV q8h (MD) F/u scr/cultures/levels   Height: 5\' 11"  (180.3 cm) Weight: 170 lb (77.1 kg) IBW/kg (Calculated) : 75.3  Temp (24hrs), Avg:100 F (37.8 C), Min:98.5 F (36.9 C), Max:102.5 F (39.2 C)  Recent Labs  Lab 11/17/17 2326 11/18/17 0149  WBC 14.7*  --   CREATININE 1.37*  --   LATICACIDVEN  --  0.71    Estimated Creatinine Clearance: 36.6 mL/min (A) (by C-G formula based on SCr of 1.37 mg/dL (H)).    Allergies  Allergen Reactions  . Percocet [Oxycodone-Acetaminophen] Other (See Comments)    Just doesn't like it  . Robaxin [Methocarbamol]   . Diovan [Valsartan] Rash    Antimicrobials this admission: 3/22 zosyn >> x1 ED 3/22 vancomycin >>  3/23 rocephin >> 3/23 flagyl >>  Dose adjustments this admission:   Microbiology results:  BCx:   UCx:    Sputum:  MRSA PCR:   Thank you for allowing pharmacy to be a part of this patient's care.  [name] [name] 11/18/2017 3:38 AM

## 2017-11-18 NOTE — ED Notes (Signed)
Patient hasn't urinated since coming to the ED. Bladder scanned patient - 312ml shown. Notified Dr. Alcario Drought. He stated he wanted to give him a little more time to see if he could urinate on his own. Patient has CKD stage 3. Continue to monitor.

## 2017-11-18 NOTE — Progress Notes (Addendum)
Patient seen and evaluated, chart reviewed, please see EMR for updated orders. Please see full H&P dictated by admitting physician Dr. Alcario Drought for same date of service.    Rt Foot Cellulitis-continue Vanco and Rocephin, patient has an appointment with wound care clinic on Monday, 11/20/2017  H/o adrenal insufficiency-continue steroid replacement  Patient's cognitive abilities are very impressive for somebody his age

## 2017-11-18 NOTE — Progress Notes (Signed)
Initial Nutrition Assessment  INTERVENTION:   Provide Prostat liquid protein PO 30 ml BID with meals, each supplement provides 100 kcal, 15 grams protein.  NUTRITION DIAGNOSIS:   Increased nutrient needs related to wound healing as evidenced by estimated needs.  GOAL:   Patient will meet greater than or equal to 90% of their needs  MONITOR:   PO intake, Supplement acceptance, Weight trends, Skin, I & O's, Labs  REASON FOR ASSESSMENT:   Consult Wound healing  ASSESSMENT:   84 y.o. male with medical history significant of panhypopit, adrenal insufficiency, recurrent cellulitis of the RLE.  Pt with appropriate weight given advanced age. Pt with chronic foot wound. Pt currently septic. Will order Prostat BID. Pt is currently consuming 100% of his meals, his breakfast tray provided ~800 kcal and 15g protein. Would benefit from Juven supplements once sepsis is resolved.  Per chart review, pt has lost 10 lb since 2/20 (6% wt loss x 1 month, significant for time frame).  Medications: Vitamin D tablet daily, Omega 3 capsule daily, Vitamin B-12 tablet daily Labs reviewed: GFR: 43  NUTRITION - FOCUSED PHYSICAL EXAM: -Most likely related to advanced age.    Most Recent Value  Orbital Region  No depletion  Upper Arm Region  No depletion  Thoracic and Lumbar Region  Unable to assess  Buccal Region  No depletion  Temple Region  No depletion  Clavicle Bone Region  Moderate depletion  Clavicle and Acromion Bone Region  Moderate depletion  Scapular Bone Region  Unable to assess  Dorsal Hand  No depletion  Patellar Region  Unable to assess  Anterior Thigh Region  Unable to assess  Posterior Calf Region  Unable to assess  Edema (RD Assessment)  Mild      Diet Order:  Diet Heart Room service appropriate? Yes; Fluid consistency: Thin  EDUCATION NEEDS:   No education needs have been identified at this time  Skin:  Skin Assessment: Skin Integrity Issues: Skin Integrity Issues::  Other (Comment) Other: rt foot ulcer  Last BM:  3/22  Height:   Ht Readings from Last 1 Encounters:  11/18/17 5\' 11"  (1.803 m)    Weight:   Wt Readings from Last 1 Encounters:  11/18/17 170 lb (77.1 kg)    Ideal Body Weight:  78.2 kg  BMI:  Body mass index is 23.71 kg/m.  Estimated Nutritional Needs:   Kcal:  1950-2150  Protein:  100-110g  Fluid:  2.1L/day  Clayton Bibles, MS, RD, LDN [place] Diet[facility] Pager: [phone] After Hours Pager: [phone]

## 2017-11-18 NOTE — ED Notes (Signed)
Patient 90% O2 on RA. Placed on 2L O2 and now O2 sat is 95%.

## 2017-11-18 NOTE — H&P (Signed)
History and Physical    [employer]. SEG:[id number] DOB: March 18, 1923 DOA: 11/17/2017  PCP: [name], DO  Patient coming from: ALF  I have personally briefly reviewed patient's old medical records in [place]  Chief Complaint: Fever, cellulitis  HPI: [name] [name]. is a 84 y.o. male with medical history significant of panhypopit, adrenal insufficiency, recurrent cellulitis of the RLE.  Patient was sent in to the ED from ALF after he reported feeling poorly.  Mild pain, associated nausea.  No ABx for infection this time around.  Has known wound on bottom of R foot (likely source).   ED Course: Fever 102.x.  Patient given zosyn / vanc.  Then BP dropped from 737T systolic to 89 systolic.  Given 2.5L NS bolus per sepsis protocol as well as 125 solumedrol.   Review of Systems: As per HPI otherwise 10 point review of systems negative.   Past Medical History:  Diagnosis Date  . Allergic rhinitis    Prior allergy shots 20 years  . Anemia   . Anemia, B12 deficiency 2000  . Arthritis    Status post left total replacement 8 2011  . BPH (benign prostatic hyperplasia) 2013  . Cancer ([place])    renal cell ca and skin cancer   . Colitis 2014  . Difficult intubation 1994   surgery had to be  stopped due to injury to "throat"  . Difficult intubation 1994   no trouble since.  Required nasotracheal intubation  '02 [facility] / ANESTHESIA RECORD FROM 2013 AND 2016 IN EPIC  . Glaucoma 2007   Status post left trabeculectomy 2007  . History of colon polyps    Colonoscopy 2001  . History of kidney stones   . History of renal cell carcinoma 1994   Status post right nephrectomy  . Hyperlipidemia 2003  . Hypertension   . 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 Hypothyroidism (acquired) 2000  . Nocturia   . Pituitary mass ([place]) 2000   S/p transphenoidal excision 05/1999 ([facility])  . Vitamin D deficiency 2009    Past  Surgical History:  Procedure Laterality Date  . BRAIN SURGERY  2000   pituatary gland removed .  Marland Kitchen CHOLECYSTECTOMY  1984  . Ectropion surgery Bilateral 2006  . EYE SURGERY  over last 6 yrs.     trabeculectomy...   . EYE SURGERY   cat ext ou  . JOINT REPLACEMENT  2011   knee left  . KNEE ARTHROSCOPY Right 02/06/2015   Procedure: RIGHT ARTHROSCOPY KNEE WITH LATERAL MENSICAL  DEBRIDEMENT;  Surgeon: Gaynelle Arabian, MD;  Location: [facility];  Service: Orthopedics;  Laterality: Right;  . Mohs procedure      for skin cancer on nose   . NEPHRECTOMY Right 1994   Renal cell  . REVERSE SHOULDER ARTHROPLASTY  12/15/2011   Procedure: REVERSE SHOULDER ARTHROPLASTY;  Surgeon: Marin Shutter, MD;  Location: [place];  Service: Orthopedics;  Laterality: Right;  right total reverse shoulder  . TONSILLECTOMY    . TOTAL KNEE ARTHROPLASTY Right 06/29/2015   Procedure: TOTAL KNEE ARTHROPLASTY;  Surgeon: Gaynelle Arabian, MD;  Location: [facility];  Service: Orthopedics;  Laterality: Right;  . Transsphenoidal excision pituitary tumor  05/1999   A.Tommi Rumps, M.D.([facility])     reports that he quit smoking about 29 years ago. His smoking use included pipe. He has never used smokeless tobacco. He reports that he drinks alcohol. He reports that  he does not use drugs.  Allergies  Allergen Reactions  . Percocet [Oxycodone-Acetaminophen] Other (See Comments)    Just doesn't like it  . Robaxin [Methocarbamol]   . Diovan [Valsartan] Rash    Family History  Problem Relation Age of Onset  . Lung cancer Father   . Anesthesia problems Neg Hx   . Hypotension Neg Hx   . Malignant hyperthermia Neg Hx   . Pseudochol deficiency Neg Hx      Prior to Admission medications   Medication Sig Start Date End Date Taking? Authorizing Provider  Cholecalciferol (VITAMIN D3) 5000 units TABS Take 1 tablet every day   Yes [provider]  Coenzyme Q10 300 MG CAPS Take 1 capsule by mouth daily.   Yes [provider]    finasteride (PROSCAR) 5 MG tablet Take 5 mg by mouth every morning.    Yes [provider]  Flaxseed, Linseed, (EQL FLAXSEED OIL) 1200 MG CAPS Take as directed once a day   Yes [provider]  Multiple Vitamins-Minerals (MULTIVITAMIN & MINERAL PO) Take 1 tablet by mouth daily.   Yes [provider]  Omega-3 Fatty Acids (FISH OIL) 1000 MG CAPS Take 1,000 mg by mouth daily.    Yes [provider]  pantoprazole (PROTONIX) 40 MG tablet TAKE 1 TABLET BY MOUTH DAILY. 10/17/17  Yes Reed, Tiffany L, DO  pravastatin (PRAVACHOL) 80 MG tablet Take 80 mg by mouth every evening.    Yes [provider]  predniSONE (DELTASONE) 5 MG tablet Take 2.5-5 mg by mouth 2 (two) times daily. Take 1 tablet (5 mg) in the am and Take 1/2 tablet (2.5 mg) in the evening.   Yes [provider]  SYNTHROID 88 MCG tablet Take 88 mcg by mouth daily before breakfast.  12/26/16  Yes [provider]  testosterone cypionate (DEPOTESTOSTERONE CYPIONATE) 200 MG/ML injection Inject 30 mg (0.15 ml) intramuscularly weekly. Must discard vial after 90 days. 01/25/17  Yes [provider]  UNABLE TO FIND Serum drops in both eyes as directed as needed   Yes [provider]  vitamin B-12 (CYANOCOBALAMIN) 1000 MCG tablet Take 1,000 mcg by mouth daily.   Yes [provider]    Physical Exam: Vitals:   11/18/17 0145 11/18/17 0145 11/18/17 0200 11/18/17 0230  BP: (!) 120/38 (!) 120/38 (!) 106/51 (!) 107/44  Pulse: 70 78 72 67  Resp: 14 18 16 17   Temp:  98.5 F (36.9 C)    TempSrc:  Oral    SpO2: 96% 95% 97% 92%  Weight:      Height:        Constitutional: NAD, calm, comfortable Eyes: PERRL, lids and conjunctivae normal ENMT: Mucous membranes are moist. Posterior pharynx clear of any exudate or lesions.Normal dentition.  Neck: normal, supple, no masses, no thyromegaly Respiratory: clear to auscultation bilaterally, no wheezing, no crackles. Normal  respiratory effort. No accessory muscle use.  Cardiovascular: Regular rate and rhythm, no murmurs / rubs / gallops. No extremity edema. 2+ pedal pulses. No carotid bruits.  Abdomen: no tenderness, no masses palpated. No hepatosplenomegaly. Bowel sounds positive.  Musculoskeletal: no clubbing / cyanosis. No joint deformity upper and lower extremities. Good ROM, no contractures. Normal muscle tone.  Skin: RLE ankle warm compared to L but not very erythematous at this point.  He does have an ulcer on the ball of his foot just behind the great toe. Neurologic: CN 2-12 grossly intact. Sensation intact, DTR normal. Strength 5/5 in all  4.  Psychiatric: Normal judgment and insight. Alert and oriented x 3. Normal mood.    Labs on Admission: I have personally reviewed following labs and imaging studies  CBC: Recent Labs  Lab 11/17/17 2326  WBC 14.7*  NEUTROABS 11.7*  HGB 10.9*  HCT 33.2*  MCV 97.1  PLT 662   Basic Metabolic Panel: Recent Labs  Lab 11/17/17 2326  NA 140  K 3.6  CL 105  CO2 28  GLUCOSE 120*  BUN 26*  CREATININE 1.37*  CALCIUM 9.1   GFR: Estimated Creatinine Clearance: 36.6 mL/min (A) (by C-G formula based on SCr of 1.37 mg/dL (H)). Liver Function Tests: No results for input(s): AST, ALT, ALKPHOS, BILITOT, PROT, ALBUMIN in the last 168 hours. No results for input(s): LIPASE, AMYLASE in the last 168 hours. No results for input(s): AMMONIA in the last 168 hours. Coagulation Profile: No results for input(s): INR, PROTIME in the last 168 hours. Cardiac Enzymes: No results for input(s): CKTOTAL, CKMB, CKMBINDEX, TROPONINI in the last 168 hours. BNP (last 3 results) No results for input(s): PROBNP in the last 8760 hours. HbA1C: No results for input(s): HGBA1C in the last 72 hours. CBG: No results for input(s): GLUCAP in the last 168 hours. Lipid Profile: No results for input(s): CHOL, HDL, LDLCALC, TRIG, CHOLHDL, LDLDIRECT in the last 72 hours. Thyroid Function  Tests: No results for input(s): TSH, T4TOTAL, FREET4, T3FREE, THYROIDAB in the last 72 hours. Anemia Panel: No results for input(s): VITAMINB12, FOLATE, FERRITIN, TIBC, IRON, RETICCTPCT in the last 72 hours. Urine analysis:    Component Value Date/Time   COLORURINE STRAW (A) 10/12/2016 1412   APPEARANCEUR CLEAR 10/12/2016 1412   LABSPEC 1.010 10/12/2016 1412   PHURINE 7.0 10/12/2016 1412   GLUCOSEU NEGATIVE 10/12/2016 1412   HGBUR SMALL (A) 10/12/2016 1412   BILIRUBINUR NEGATIVE 10/12/2016 1412   KETONESUR 5 (A) 10/12/2016 1412   PROTEINUR NEGATIVE 10/12/2016 1412   UROBILINOGEN 0.2 06/24/2015 1134   NITRITE NEGATIVE 10/12/2016 1412   LEUKOCYTESUR NEGATIVE 10/12/2016 1412    Radiological Exams on Admission: Dg Chest 2 View  Result Date: 11/18/2017 CLINICAL DATA:  Initial evaluation for acute fever. EXAM: CHEST - 2 VIEW COMPARISON:  Prior radiograph from 10/13/2016. FINDINGS: Mild cardiomegaly, stable. Mediastinal silhouette within normal limits. Aortic atherosclerosis. Lungs are hypoinflated. Mild bibasilar atelectatic changes and/or scarring. No consolidative airspace disease. No pulmonary edema or significant pleural effusion. No pneumothorax. Right shoulder arthroplasty noted.  No acute osseous abnormality. IMPRESSION: 1. Shallow lung inflation with mild bibasilar atelectasis and/or scarring. 2. No other active cardiopulmonary disease. 3. Aortic atherosclerosis. Electronically Signed   By: Jeannine Boga M.D.   On: 11/18/2017 01:28   Dg Foot Complete Right  Result Date: 11/17/2017 CLINICAL DATA:  Leg cellulitis and foot wound. EXAM: RIGHT FOOT COMPLETE - 3+ VIEW COMPARISON:  None. FINDINGS: Apart from some interval increase in soft tissue swelling about the base of the great toe, no significant change in the osseous appearance of the foot with severe osteoarthritic joint space narrowing of the first MTP with subchondral degenerative cystic change. No bony sequestrum is noted.  Advanced degenerative changes are present of the second third TMT articulations. No acute fracture or foreign body. No soft tissue gas. No soft tissue tophus. Shallow ulceration along the plantar aspect of the forefoot as before. IMPRESSION: Some progression of soft tissue swelling without frank bone destruction involving the forefoot and in particular the great toe. Redemonstration of severe osteoarthritis of the first MTP and mild to  moderate degenerative change at the base of the second and third TMT joints. Electronically Signed   By: Ashley Royalty M.D.   On: 11/17/2017 23:22    EKG: Independently reviewed.  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 Ulcer of R foot with cellulitis - Sepsis with Fever, WBC. 1. IVF: 2.5L bolus 2. Foot ulcer pathway 3. Rocephin, flagyl, and vanc (long term care facility resident). 4. Wound care consult in AM 5. ABIs pending 6. Tylenol PRN fever 7. Checking A1C, but patient not known to be diabetic 2. Adrenal insufficiency - sick but not in full adrenal crisis at this point 1. Will "tripple up" on home steroid dose 2. So Prednisone 15mg  Q breakfast 3. And prednisone 7.5mg  Q dinner 3. CKD stage 3 - chronic and stable 4. Panhypopit - 1. Continue home hormone replacement 2. Except increase prednisone as above  DVT prophylaxis: Lovenox Code Status: DNR - confirmed with patient Family Communication: No family in room Disposition Plan: ALF after admit Consults called: None Admission status: Admit to inpatient   Etta Quill DO [facility] Pager [phone]  If 7AM-7PM, please contact day team taking care of patient [email or web address] Password [facility]  11/18/2017, 3:23 AM

## 2017-11-19 LAB — CBC
HCT: 34.6 % — ABNORMAL LOW (ref 39.0–52.0)
HEMOGLOBIN: 11.2 g/dL — AB (ref 13.0–17.0)
MCH: 32.4 pg (ref 26.0–34.0)
MCHC: 32.4 g/dL (ref 30.0–36.0)
MCV: 100 fL (ref 78.0–100.0)
Platelets: 322 10*3/uL (ref 150–400)
RBC: 3.46 MIL/uL — AB (ref 4.22–5.81)
RDW: 15.2 % (ref 11.5–15.5)
WBC: 19.2 10*3/uL — ABNORMAL HIGH (ref 4.0–10.5)

## 2017-11-19 LAB — BASIC METABOLIC PANEL
Anion gap: 10 (ref 5–15)
BUN: 25 mg/dL — ABNORMAL HIGH (ref 6–20)
CHLORIDE: 104 mmol/L (ref 101–111)
CO2: 24 mmol/L (ref 22–32)
CREATININE: 1.2 mg/dL (ref 0.61–1.24)
Calcium: 8.9 mg/dL (ref 8.9–10.3)
GFR calc non Af Amer: 51 mL/min — ABNORMAL LOW (ref >60)
GFR, EST AFRICAN AMERICAN: 59 mL/min — AB (ref >60)
Glucose, Bld: 171 mg/dL — ABNORMAL HIGH (ref 65–99)
POTASSIUM: 3.8 mmol/L (ref 3.5–5.1)
SODIUM: 138 mmol/L (ref 135–145)

## 2017-11-19 MED ORDER — CEFDINIR 300 MG PO CAPS: 300.0000 mg | ORAL_CAPSULE | Freq: Two times a day (BID) | ORAL | 0 refills | Status: DC

## 2017-11-19 MED ORDER — LACTINEX PO CHEW: 1.0000 | CHEWABLE_TABLET | Freq: Three times a day (TID) | ORAL | 0 refills | Status: DC

## 2017-11-19 MED ORDER — DOXYCYCLINE HYCLATE 100 MG PO TABS: 100.0000 mg | ORAL_TABLET | Freq: Two times a day (BID) | ORAL | 0 refills | Status: DC

## 2017-11-19 NOTE — NC FL2 (Signed)
[place] LEVEL OF CARE SCREENING TOOL     IDENTIFICATION  Patient Name: [name] [name]. Birthdate: 11/29/1922 Sex: male Admission Date (Current Location): 11/17/2017  [facility] and [place] Number:  Herbalist and Address:  [facility],  [place] [address], [place]      Provider Number: [id number]  Attending Physician Name and Address:  Roxan Hockey, MD  Relative Name and Phone Number:       Current Level of Care: Hospital Recommended Level of Care: [place] Prior Approval Number:    Date Approved/Denied:   PASRR Number: [id number]  Discharge Plan: SNF    Current Diagnoses: Patient Active Problem List   Diagnosis Date Noted  . Ulcer of right foot with fat layer exposed ([place]) 11/18/2017  . Cellulitis of right foot 11/18/2017  . Cellulitis of right ankle 11/18/2017  . Sepsis ([place]) 11/18/2017  . Adrenal insufficiency ([place]) 07/01/2017  . Bulging of intervertebral disc between L4 and L5 07/01/2017  . At high risk for bleeding 07/01/2017  . Right buttock pain 02/08/2017  . Leukocytosis 10/12/2016  . HLD (hyperlipidemia) 04/20/2016  . BPH (benign prostatic hyperplasia) 12/08/2015  . CKD (chronic kidney disease) stage 3, GFR 30-59 ml/min (HCC) 11/27/2015  . S/P total knee arthroplasty 07/13/2015  . OA (osteoarthritis) of knee 06/29/2015  . Ectropion 05/06/2015  . Lateral meniscal tear 02/05/2015  . Hyponatremia 10/20/2013  . Weakness 10/19/2013  . Hypertension   . 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 Hypothyroidism (acquired)   . Anemia, B12 deficiency   . Meibomian gland disease 08/06/2013  . Primary open angle glaucoma 06/19/2013  . Arthritis of shoulder region, right 12/15/2011  . Exposure keratitis 08/17/2011  . Dry eye 07/13/2011    Orientation RESPIRATION BLADDER Height & Weight     Self, Time, Situation, Place  Normal Incontinent, External catheter Weight: 170 lb (77.1  kg) Height:  5\' 11"  (180.3 cm)  BEHAVIORAL SYMPTOMS/MOOD NEUROLOGICAL BOWEL NUTRITION STATUS      Continent Diet(heart healthy)  AMBULATORY STATUS COMMUNICATION OF NEEDS Skin   Limited Assist Verbally Other (Comment)(open right foot wound, with foam dressing)                       Personal Care Assistance Level of Assistance  Bathing, Feeding, Dressing Bathing Assistance: Limited assistance Feeding assistance: Limited assistance Dressing Assistance: Limited assistance     Functional Limitations Info  Sight, Hearing, Speech Sight Info: Adequate Hearing Info: Impaired Speech Info: Adequate    SPECIAL CARE FACTORS FREQUENCY  PT (By licensed PT), OT (By licensed OT)     PT Frequency: 5x/wk OT Frequency: 5x/wk            Contractures Contractures Info: Not present    Additional Factors Info  Code Status, Allergies Code Status Info: DNR Allergies Info: Percocet Oxycodone-acetaminophen, Robaxin Methocarbamol, Diovan Valsartan           Current Medications (11/19/2017):  This is the current hospital active medication list Current Facility-Administered Medications  Medication Dose Route Frequency Provider Last Rate Last Dose  . acetaminophen (TYLENOL) tablet 650 mg  650 mg Oral Q6H PRN Etta Quill, DO       Or  . acetaminophen (TYLENOL) suppository 650 mg  650 mg Rectal Q6H PRN Etta Quill, DO      . cefTRIAXone (ROCEPHIN) 2 g in sodium chloride 0.9 % 100 mL IVPB  2 g Intravenous Daily Alcario Drought,  Toy Care, DO   Stopped at 11/19/17 [phone]  . cholecalciferol (VITAMIN D) tablet 5,000 Units  5,000 Units Oral Daily Etta Quill, DO   5,000 Units at 11/19/17 1024  . enoxaparin (LOVENOX) injection 40 mg  40 mg Subcutaneous Q24H Jennette Kettle M, DO   40 mg at 11/19/17 1024  . feeding supplement (PRO-STAT SUGAR FREE 64) liquid 30 mL  30 mL Oral BID Emokpae, Courage, MD   30 mL at 11/19/17 1024  . finasteride (PROSCAR) tablet 5 mg  5 mg Oral q morning - 10a Jennette Kettle M, DO   5 mg at 11/19/17 1024  . levothyroxine (SYNTHROID, LEVOTHROID) tablet 88 mcg  88 mcg Oral QAC breakfast Etta Quill, DO   88 mcg at 11/19/17 0750  . metroNIDAZOLE (FLAGYL) tablet 500 mg  500 mg Oral Q8H Jennette Kettle M, DO   500 mg at 11/19/17 6945  . multivitamin liquid   Oral Daily Etta Quill, DO   15 mL at 11/19/17 1023  . omega-3 acid ethyl esters (LOVAZA) capsule 1,000 mg  1,000 mg Oral Daily Jennette Kettle M, DO   1,000 mg at 11/19/17 1024  . ondansetron (ZOFRAN) tablet 4 mg  4 mg Oral Q6H PRN Etta Quill, DO       Or  . ondansetron [facility]) injection 4 mg  4 mg Intravenous Q6H PRN Etta Quill, DO      . pantoprazole (PROTONIX) EC tablet 40 mg  40 mg Oral Daily Jennette Kettle M, DO   40 mg at 11/18/17 1003  . pravastatin (PRAVACHOL) tablet 80 mg  80 mg Oral QPM Jennette Kettle M, DO   80 mg at 11/18/17 1813  . predniSONE (DELTASONE) tablet 15 mg  15 mg Oral Q breakfast Etta Quill, DO   15 mg at 11/19/17 0750  . predniSONE (DELTASONE) tablet 7.5 mg  7.5 mg Oral QAC supper Etta Quill, DO   7.5 mg at 11/18/17 1814  . vancomycin (VANCOCIN) 1,250 mg in sodium chloride 0.9 % 250 mL IVPB  1,250 mg Intravenous Q36H [name], RPH   Stopped at 11/18/17 2340  . vitamin B-12 (CYANOCOBALAMIN) tablet 1,000 mcg  1,000 mcg Oral Daily Jennette Kettle M, DO   1,000 mcg at 11/19/17 1024     Discharge Medications: Please see discharge summary for a list of discharge medications.  Relevant Imaging Results:  Relevant Lab Results:   Additional Information SS#: [id number]  Geralynn Ochs, LCSW

## 2017-11-19 NOTE — Clinical Social Work Note (Signed)
Clinical Social Work Assessment  Patient Details  Name: [name] [name]. MRN: [id number] Date of Birth: 1923-08-23  Date of referral:  11/19/17               Reason for consult:  Facility Placement                Permission sought to share information with:  Facility Sport and exercise psychologist, Family Supports Permission granted to share information::  Yes, Verbal Permission Granted  Name::     Copywriter, advertising::  [facility]  Relationship::  Significant other  Contact Information:     Housing/Transportation Living arrangements for the past 2 months:  Charity fundraiser of Information:  Patient Patient Interpreter Needed:  None Criminal Activity/Legal Involvement Pertinent to Current Situation/Hospitalization:  No - Comment as needed Significant Relationships:  Adult Children, Significant Other Lives with:  Self, Facility Resident Do you feel safe going back to the place where you live?  Yes Need for family participation in patient care:  No (Coment)  Care giving concerns:  Patient lives at [facility] but will be going to SNF for a short period of time upon return there.    Social Worker assessment / plan:  CSW alerted by [facility] that patient will be going to [facility] upon return. CSW completed paperwork and sent to [facility], Dian Situ. CSW met with patient and patient's significant other at bedside to discuss return to [place]. CSW confirmed plan with patient, and that patient's family will be providing transport back to [facility].  Employment status:  Retired Nurse, adult PT Recommendations:  Not assessed at this time Information / Referral to community resources:     Patient/Family's Response to care:  Patient and patient's family agreeable to return to [place] and to go to SNF for a short period of time.  Patient/Family's Understanding of and Emotional Response to Diagnosis, Current Treatment, and  Prognosis:  Patient discussed how he had already arranged to go to SNF at [facility], and knows where he will be going and has transport with family. Patient provided information on who to contact over at [facility], and that he was appreciative of CSW assistance in coordinating return.  Emotional Assessment Appearance:  Appears stated age Attitude/Demeanor/Rapport:  Engaged Affect (typically observed):  Pleasant Orientation:  Oriented to Self, Oriented to Place, Oriented to  Time, Oriented to Situation Alcohol / Substance use:  Not Applicable Psych involvement (Current and /or in the community):  No (Comment)  Discharge Needs  Concerns to be addressed:  Care Coordination Readmission within the last 30 days:  No Current discharge risk:  Dependent with Mobility Barriers to Discharge:  No Barriers Identified   Geralynn Ochs, LCSW 11/19/2017, 2:02 PM

## 2017-11-19 NOTE — Care Management Note (Signed)
Case Management Note  Patient Details  Name: [name] [name]. MRN: [id number] Date of Birth: 1923/02/04  Subjective/Objective:    Cellulitis, sepsis                Action/Plan: NCM received call from Isac Sarna [phone] fax [phone]. Pt is requesting to come back to [facility] SN[facility] rehab. She will need FL2 and dc summary. NCM contacted CSW with referral.   Expected Discharge Date:  11/19/17               Expected Discharge Plan:  [place]  In-House Referral:  Clinical Social Work  Discharge planning Services  CM Consult  Post Acute Care Choice:  NA Choice offered to:  NA  DME Arranged:  N/A DME Agency:  NA  HH Arranged:  NA HH Agency:  NA  Status of Service:  Completed, signed off  If discussed at [employer], dates discussed:    Additional Comments:  Erenest Rasher, RN 11/19/2017, 1:19 PM

## 2017-11-19 NOTE — Discharge Summary (Signed)
[name] [name]., is a 84 y.o. male  DOB 1923-03-16  MRN [id number].  Admission date:  11/17/2017  Admitting Physician  [name] Quill, DO  Discharge Date:  11/19/2017   Primary MD  [name] Curry, DO  Recommendations for primary care physician for things to follow:   1)Take Doxycycline and Omnicef antibiotic as prescribed for Right Foot infection 2)Follow-up with the wound care clinic starting on Monday 11/20/2017 for ongoing wound care to the right foot wound 3)Take Lactobacillus and other probiotic agents to help your bowels while you are on antibiotics, consider eating lots of yogurt 4) increase Prednisone to 10 mg daily for 4 days, then back down to 5 mg after that  Admission Diagnosis  Cellulitis of right lower extremity [L03.115]   Discharge Diagnosis  Cellulitis of right lower extremity [L03.115]    Principal Problem:   Ulcer of right foot with fat layer exposed ([place]) Active Problems:   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 Allergic rhinitis    Prior allergy shots 20 years  . Anemia   . Anemia, B12 deficiency 2000  . Arthritis    Status post left total replacement 8 2011  . BPH (benign prostatic hyperplasia) 2013  . Cancer ([place])    renal cell ca and skin cancer   . Colitis 2014  . Difficult intubation 1994   surgery had to be  stopped due to injury to "throat"  . Difficult intubation 1994   no trouble since.  Required nasotracheal intubation  '02 [facility] / ANESTHESIA RECORD FROM 2013 AND 2016 IN EPIC  . Glaucoma 2007   Status post left trabeculectomy 2007  . History of colon polyps    Colonoscopy 2001  . History of kidney stones   . History of renal cell  carcinoma 1994   Status post right nephrectomy  . Hyperlipidemia 2003  . Hypertension   . 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 Hypothyroidism (acquired) 2000  . Nocturia   . Pituitary mass ([place]) 2000   S/p transphenoidal excision 05/1999 ([facility])  . Vitamin D deficiency 2009    Past Surgical History:  Procedure Laterality Date  . BRAIN SURGERY  2000   pituatary gland removed .  [name] [name] CHOLECYSTECTOMY  1984  . Ectropion surgery Bilateral 2006  . EYE SURGERY  over last 6 yrs.     trabeculectomy...   . EYE SURGERY   cat ext ou  . JOINT REPLACEMENT  2011   knee left  . KNEE ARTHROSCOPY Right 02/06/2015   Procedure: RIGHT ARTHROSCOPY KNEE WITH LATERAL MENSICAL  DEBRIDEMENT;  Surgeon: Gaynelle Arabian, MD;  Location: [facility];  Service: Orthopedics;  Laterality: Right;  . Mohs procedure  for skin cancer on nose   . NEPHRECTOMY Right 1994   Renal cell  . REVERSE SHOULDER ARTHROPLASTY  12/15/2011   Procedure: REVERSE SHOULDER ARTHROPLASTY;  Surgeon: Marin Shutter, MD;  Location: [place];  Service: Orthopedics;  Laterality: Right;  right total reverse shoulder  . TONSILLECTOMY    . TOTAL KNEE ARTHROPLASTY Right 06/29/2015   Procedure: TOTAL KNEE ARTHROPLASTY;  Surgeon: Gaynelle Arabian, MD;  Location: [facility];  Service: Orthopedics;  Laterality: Right;  . Transsphenoidal excision pituitary tumor  05/1999   A.Tommi Rumps, M.D.([facility])       HPI  from the history and physical done on the day of admission:      Chief Complaint: Fever, cellulitis  HPI: [name] [name]. is a 84 y.o. male with medical history significant of panhypopit, adrenal insufficiency, recurrent cellulitis of the RLE.  Patient was sent in to the ED from ALF after he reported feeling poorly.  Mild pain, associated nausea.  No ABx for infection this time around.  Has known wound on bottom of R foot (likely source).   ED Course: Fever 102.x.  Patient given zosyn /  vanc.  Then BP dropped from 616W systolic to 89 systolic.  Given 2.5L NS bolus per sepsis protocol as well as 125 solumedrol.    Hospital Course:     1)RT FOOT infected Ulcer of R foot  cellulitis -overall clinically much improved, patient received very high dose Solu-Medrol on admission, so he has persistent leukocytosis, no fevers no evidence of sepsis at this time, patient feels much better, requesting discharge home, patient was treated with IV vancomycin and Rocephin,, okay to discharge home on doxycycline and Omnicef to cover MRSA and staph strep, patient has an appointment with wound care clinic on Monday, 11/20/2017.[name] [name] Foot x-rays without osteomyelitis findings.,  Patient is not diabetic A1c 6.2.   Elevated C-reactive protein and sed rate noted, consider MRI of the right foot and repeat sed rate and CRP if patient fails outpatient doxycycline and Omnicef, also consider vascular studies of the right lower extremities at that time  2) panhypopituitarism/adrenal insufficiency-patient received Solu Medrol 125 mg on admission, advised prednisone 10 mg daily for 4 days and then go back to 5 mg daily which is patient's usual dose, continue testosterone and levothyroxine  3)CKD III-creatinine is down to 1.2 from 1.37 on admission, at baseline patient has CKD stage III, maintain adequate hydration, avoid nephrotoxic agents   Discharge Condition: stable  Follow UP  Diet and Activity recommendation:  As advised  Discharge Instructions    Discharge Instructions    Call MD for:  difficulty breathing, headache or visual disturbances   Complete by:  As directed    Call MD for:  persistant dizziness or light-headedness   Complete by:  As directed    Call MD for:  persistant nausea and vomiting   Complete by:  As directed    Call MD for:  temperature >100.4   Complete by:  As directed    Diet - low sodium heart healthy   Complete by:  As directed    Discharge instructions   Complete by:  As  directed    1)Take Doxycycline and Omnicef antibiotic as prescribed for Right Foot infection 2)Follow-up with the wound care clinic starting on Monday 11/20/2017 for ongoing wound care to the right foot wound 3)Take Lactobacillus and other probiotic agents to help your bowels while you are on antibiotics, consider eating lots of yogurt 4) increase Prednisone to 10  mg daily for 4 days, then back down to 5 mg after that   Increase activity slowly   Complete by:  As directed         Discharge Medications     Allergies as of 11/19/2017      Reactions   Percocet [oxycodone-acetaminophen] Other (See Comments)   Just doesn't like it   Robaxin [methocarbamol]    Diovan [valsartan] Rash      Medication List    TAKE these medications   cefdinir 300 MG capsule Commonly known as:  OMNICEF Take 1 capsule (300 mg total) by mouth 2 (two) times daily.   Coenzyme Q10 300 MG Caps Take 1 capsule by mouth daily.   doxycycline 100 MG tablet Commonly known as:  VIBRA-TABS Take 1 tablet (100 mg total) by mouth 2 (two) times daily.   EQL FLAXSEED OIL 1200 MG Caps Take as directed once a day   finasteride 5 MG tablet Commonly known as:  PROSCAR Take 5 mg by mouth every morning.   Fish Oil 1000 MG Caps Take 1,000 mg by mouth daily.   lactobacillus acidophilus & bulgar chewable tablet Chew 1 tablet by mouth 3 (three) times daily with meals.   MULTIVITAMIN & MINERAL PO Take 1 tablet by mouth daily.   pantoprazole 40 MG tablet Commonly known as:  PROTONIX TAKE 1 TABLET BY MOUTH DAILY.   pravastatin 80 MG tablet Commonly known as:  PRAVACHOL Take 80 mg by mouth every evening.   predniSONE 5 MG tablet Commonly known as:  DELTASONE Take 2.5-5 mg by mouth 2 (two) times daily. Take 1 tablet (5 mg) in the am and Take 1/2 tablet (2.5 mg) in the evening.   SYNTHROID 88 MCG tablet Generic drug:  levothyroxine Take 88 mcg by mouth daily before breakfast.   testosterone cypionate 200 MG/ML  injection Commonly known as:  DEPOTESTOSTERONE CYPIONATE Inject 30 mg (0.15 ml) intramuscularly weekly. Must discard vial after 90 days.   UNABLE TO FIND Serum drops in both eyes as directed as needed   vitamin B-12 1000 MCG tablet Commonly known as:  CYANOCOBALAMIN Take 1,000 mcg by mouth daily.   Vitamin D3 5000 units Tabs Take 1 tablet every day       Major procedures and Radiology Reports - PLEASE review detailed and final reports for all details, in brief -    Dg Chest 2 View  Result Date: 11/18/2017 CLINICAL DATA:  Initial evaluation for acute fever. EXAM: CHEST - 2 VIEW COMPARISON:  Prior radiograph from 10/13/2016. FINDINGS: Mild cardiomegaly, stable. Mediastinal silhouette within normal limits. Aortic atherosclerosis. Lungs are hypoinflated. Mild bibasilar atelectatic changes and/or scarring. No consolidative airspace disease. No pulmonary edema or significant pleural effusion. No pneumothorax. Right shoulder arthroplasty noted.  No acute osseous abnormality. IMPRESSION: 1. Shallow lung inflation with mild bibasilar atelectasis and/or scarring. 2. No other active cardiopulmonary disease. 3. Aortic atherosclerosis. Electronically Signed   By: Jeannine Boga M.D.   On: 11/18/2017 01:28   Dg Foot Complete Right  Result Date: 11/17/2017 CLINICAL DATA:  Leg cellulitis and foot wound. EXAM: RIGHT FOOT COMPLETE - 3+ VIEW COMPARISON:  None. FINDINGS: Apart from some interval increase in soft tissue swelling about the base of the great toe, no significant change in the osseous appearance of the foot with severe osteoarthritic joint space narrowing of the first MTP with subchondral degenerative cystic change. No bony sequestrum is noted. Advanced degenerative changes are present of the second third TMT articulations. No acute fracture  or foreign body. No soft tissue gas. No soft tissue tophus. Shallow ulceration along the plantar aspect of the forefoot as before. IMPRESSION: Some  progression of soft tissue swelling without frank bone destruction involving the forefoot and in particular the great toe. Redemonstration of severe osteoarthritis of the first MTP and mild to moderate degenerative change at the base of the second and third TMT joints. Electronically Signed   By: Ashley Royalty M.D.   On: 11/17/2017 23:22   Dg Foot Complete Right  Result Date: 11/07/2017 CLINICAL DATA:  Nonhealing wound on the plantar surface of the first metatarsal. Question osteomyelitis. EXAM: RIGHT FOOT COMPLETE - 3+ VIEW COMPARISON:  None. FINDINGS: Skin ulceration on the plantar surface of the foot at the first MTP joint is identified. Soft tissue swelling is seen about the joint. No bony destructive change is identified. The patient has severe first MTP osteoarthritis. Advanced degenerative disease is also present about the second and third tarsometatarsal joints. No fracture. No radiopaque foreign body or soft tissue gas. IMPRESSION: Skin ulceration subjacent to the first MTP joint. No plain film evidence of osteomyelitis. Severe osteoarthritis first MTP joint and second and third tarsometatarsal joints. Electronically Signed   By: Inge Rise M.D.   On: 11/07/2017 08:41    Micro Results   No results found for this or any previous visit (from the past 240 hour(s)).     Today   Subjective    Ollen Bowl today has no new complaints, patient is eager to go home, no fevers, right foot swelling and discomfort has improved significantly          Patient has been seen and examined prior to discharge   Objective   Blood pressure (!) 129/56, pulse (!) 53, temperature 97.7 F (36.5 C), temperature source Oral, resp. rate 16, height 5\' 11"  (1.803 m), weight 77.1 kg (170 lb), SpO2 100 %.   Intake/Output Summary (Last 24 hours) at 11/19/2017 1239 Last data filed at 11/19/2017 0957 Gross per 24 hour  Intake 1075 ml  Output 1575 ml  Net -500 ml    Exam Constitutional: NAD, calm,  comfortable Eyes: PERRL, lids and conjunctivae normal ENMT: Mucous membranes are moist. Posterior pharynx clear of any exudate or lesions.Normal dentition.  Neck: normal, supple, no masses, no thyromegaly Respiratory: clear to auscultation bilaterally, no wheezing, no crackles. Normal respiratory effort. No accessory muscle use.  Cardiovascular: Regular rate and rhythm, no murmurs / rubs / gallops.  2+ pedal pulses.  Abdomen: no tenderness, Bowel sounds positive,Soft, increased truncal adiposity noted Skin/FEET RLE ankle/foot erythema, warmth, swelling has improved significantly  He does have an ulcer on the ball of his foot (Plantar surface) just behind the great toe (first metatarsal area) Neurologic: CN 2-12 grossly intact. Sensation intact, DTR normal. Strength 5/5 in all 4.  Psychiatric: Normal judgment and insight. Alert and oriented x 3. Normal mood.     Data Review   CBC w Diff:  Lab Results  Component Value Date   WBC 19.2 (H) 11/19/2017   HGB 11.2 (L) 11/19/2017   HCT 34.6 (L) 11/19/2017   PLT 322 11/19/2017   LYMPHOPCT 13 11/17/2017   MONOPCT 7 11/17/2017   EOSPCT 1 11/17/2017   BASOPCT 0 11/17/2017    CMP:  Lab Results  Component Value Date   NA 138 11/19/2017   NA 140 06/12/2017   K 3.8 11/19/2017   CL 104 11/19/2017   CO2 24 11/19/2017   BUN 25 (H) 11/19/2017  BUN 25 (A) 06/12/2017   CREATININE 1.20 11/19/2017   GLU 97 06/12/2017   PROT 6.5 10/12/2016   ALBUMIN 3.6 10/12/2016   BILITOT 1.3 (H) 10/12/2016   ALKPHOS 46 06/12/2017   AST 13 (A) 06/12/2017   ALT 14 06/12/2017     Total Discharge time is about 33 minutes  Roxan Hockey M.D on 11/19/2017 at 12:39 PM  [facility]   Office  [phone]  Voice Recognition Viviann Spare dictation system was used to create this note, attempts have been made to correct errors. Please contact the author with questions and/or clarifications.

## 2017-11-19 NOTE — Progress Notes (Signed)
Discharge to: [facility] Anticipated discharge date: 11/19/17 Family notified: Yes, at bedside Transportation by: Family car  Report #: [phone], [place] signing off.  Laveda Abbe LCSW [phone]

## 2017-11-19 NOTE — Discharge Instructions (Signed)
1)Take Doxycycline and Omnicef antibiotic as prescribed for Right Foot infection 2)Follow-up with the wound care clinic starting on Monday 11/20/2017 for ongoing wound care to the right foot wound 3)Take Lactobacillus and other probiotic agents to help your bowels while you are on antibiotics, consider eating lots of yogurt 4) increase Prednisone to 10 mg daily for 4 days, then back down to 5 mg after that

## 2017-11-20 ENCOUNTER — Telehealth: Payer: Self-pay

## 2017-11-20 DIAGNOSIS — L97514 Non-pressure chronic ulcer of other part of right foot with necrosis of bone: Secondary | ICD-10-CM | POA: Diagnosis not present

## 2017-11-20 DIAGNOSIS — Z87891 Personal history of nicotine dependence: Secondary | ICD-10-CM | POA: Diagnosis not present

## 2017-11-20 DIAGNOSIS — Z85528 Personal history of other malignant neoplasm of kidney: Secondary | ICD-10-CM | POA: Diagnosis not present

## 2017-11-20 DIAGNOSIS — L97516 Non-pressure chronic ulcer of other part of right foot with bone involvement without evidence of necrosis: Secondary | ICD-10-CM | POA: Diagnosis not present

## 2017-11-20 DIAGNOSIS — I7300 Raynaud's syndrome without gangrene: Secondary | ICD-10-CM | POA: Diagnosis not present

## 2017-11-20 DIAGNOSIS — I10 Essential (primary) hypertension: Secondary | ICD-10-CM | POA: Diagnosis not present

## 2017-11-20 DIAGNOSIS — G9009 Other idiopathic peripheral autonomic neuropathy: Secondary | ICD-10-CM | POA: Diagnosis not present

## 2017-11-20 DIAGNOSIS — R609 Edema, unspecified: Secondary | ICD-10-CM | POA: Diagnosis not present

## 2017-11-20 DIAGNOSIS — Z96652 Presence of left artificial knee joint: Secondary | ICD-10-CM | POA: Diagnosis not present

## 2017-11-20 DIAGNOSIS — R2241 Localized swelling, mass and lump, right lower limb: Secondary | ICD-10-CM | POA: Diagnosis not present

## 2017-11-20 DIAGNOSIS — L97519 Non-pressure chronic ulcer of other part of right foot with unspecified severity: Secondary | ICD-10-CM | POA: Diagnosis not present

## 2017-11-20 DIAGNOSIS — E11621 Type 2 diabetes mellitus with foot ulcer: Secondary | ICD-10-CM | POA: Diagnosis not present

## 2017-11-20 DIAGNOSIS — E039 Hypothyroidism, unspecified: Secondary | ICD-10-CM | POA: Diagnosis not present

## 2017-11-20 DIAGNOSIS — Z85828 Personal history of other malignant neoplasm of skin: Secondary | ICD-10-CM | POA: Diagnosis not present

## 2017-11-20 NOTE — Telephone Encounter (Signed)
Contacted pt for toc call and he stated he was in rehab at [facility] F/U is needed if patient was re-admitted to facility upon discharge. Hospital discharge from [facility] on 11/19/2017

## 2017-11-21 ENCOUNTER — Non-Acute Institutional Stay (SKILLED_NURSING_FACILITY): Payer: PPO | Admitting: Internal Medicine

## 2017-11-21 ENCOUNTER — Encounter: Payer: Self-pay | Admitting: Internal Medicine

## 2017-11-21 ENCOUNTER — Other Ambulatory Visit: Payer: Self-pay | Admitting: Internal Medicine

## 2017-11-21 DIAGNOSIS — L97514 Non-pressure chronic ulcer of other part of right foot with necrosis of bone: Secondary | ICD-10-CM

## 2017-11-21 DIAGNOSIS — S91309A Unspecified open wound, unspecified foot, initial encounter: Secondary | ICD-10-CM | POA: Diagnosis not present

## 2017-11-21 DIAGNOSIS — E2740 Unspecified adrenocortical insufficiency: Secondary | ICD-10-CM

## 2017-11-21 DIAGNOSIS — L03115 Cellulitis of right lower limb: Secondary | ICD-10-CM | POA: Diagnosis not present

## 2017-11-21 NOTE — Progress Notes (Signed)
Patient ID: [name] [name]., male   DOB: Nov 07, 1922, 84 y.o.   MRN: [id number]  Provider:  Rexene Edison. Mariea Clonts, D.O., C.M.D. Location:  [facility] Room Number: rehab admission Place of Service:  SNF (31)  PCP: [name], DO Patient Care Team: [name], DO as PCP - General (Geriatric Medicine) Community, [name], [name], MD as Referring Physician (Endocrinology)  Extended Emergency Contact Information Primary Emergency Contact: Hietpas,[name] Address: [address]          [place], [place] [address] [name] of [place] Phone: [phone] Mobile Phone: [phone] Relation: Son Secondary Emergency Contact: Barnetta Hammersmith , [place] Address: [address]          [place], [place] [address] [name] of [place] Phone: [phone] Relation: Son  Code Status: DNR Goals of Care: Advanced Directive information Advanced Directives 11/21/2017  Does Patient Have a Medical Advance Directive? Yes  Type of Advance Directive Out of facility DNR (pink MOST or yellow form);Healthcare Power of Attorney  Does patient want to make changes to medical advance directive? No - Patient declined  Copy of [place] in Chart? Yes  Would patient like information on creating a medical advance directive? -  Pre-existing out of facility DNR order (yellow form or pink MOST form) Yellow form placed in chart (order not valid for inpatient use);Pink MOST form placed in chart (order not valid for inpatient use)   Chief Complaint  Patient presents with  . New Admit To SNF    Rehab admission    HPI: Patient is a 84 y.o. male seen today for admission to  [facility] s/p hospitalization for right lower extremity cellulitis due to wound under first MTP on the right.  He is completing a course of doxycycline and omnicef.  His prednisone was increased to 10mg  total per day for 4 days and then will return to  his usual dosing of 5mg  total per day due to his adrenal insufficiency.  He is currently getting wound care every other day and sees the wound care center each Monday.  Dr. Dellia Nims saw him yesterday at [facility] and ordered an MRI b/c he had another recurrence of his cellulitis despite an xray negative for osteomyelitis.  Currently, the erythema of his right lower leg has resolved, he's afebrile and denies pain.     Past Medical History:  Diagnosis Date  . Allergic rhinitis    Prior allergy shots 20 years  . Anemia   . Anemia, B12 deficiency 2000  . Arthritis    Status post left total replacement 8 2011  . BPH (benign prostatic hyperplasia) 2013  . Cancer ([place])    renal cell ca and skin cancer   . Colitis 2014  . Difficult intubation 1994   surgery had to be  stopped due to injury to "throat"  . Difficult intubation 1994   no trouble since.  Required nasotracheal intubation  '02 [facility] / ANESTHESIA RECORD FROM 2013 AND 2016 IN EPIC  . Glaucoma 2007   Status post left trabeculectomy 2007  . History of colon polyps    Colonoscopy 2001  . History of kidney stones   . History of renal cell carcinoma 1994   Status post right nephrectomy  . Hyperlipidemia 2003  . Hypertension   . 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 Hypothyroidism (acquired) 2000  . Nocturia   . Pituitary mass ([place])  2000   S/p transphenoidal excision 05/1999 ([facility])  . Vitamin D deficiency 2009   Past Surgical History:  Procedure Laterality Date  . BRAIN SURGERY  2000   pituatary gland removed .  Marland Kitchen CHOLECYSTECTOMY  1984  . Ectropion surgery Bilateral 2006  . EYE SURGERY  over last 6 yrs.     trabeculectomy...   . EYE SURGERY   cat ext ou  . JOINT REPLACEMENT  2011   knee left  . KNEE ARTHROSCOPY Right 02/06/2015   Procedure: RIGHT ARTHROSCOPY KNEE WITH LATERAL MENSICAL  DEBRIDEMENT;  Surgeon: Gaynelle Arabian, MD;  Location: [facility];  Service: Orthopedics;   Laterality: Right;  . Mohs procedure      for skin cancer on nose   . NEPHRECTOMY Right 1994   Renal cell  . REVERSE SHOULDER ARTHROPLASTY  12/15/2011   Procedure: REVERSE SHOULDER ARTHROPLASTY;  Surgeon: Marin Shutter, MD;  Location: [facility];  Service: Orthopedics;  Laterality: Right;  right total reverse shoulder  . TONSILLECTOMY    . TOTAL KNEE ARTHROPLASTY Right 06/29/2015   Procedure: TOTAL KNEE ARTHROPLASTY;  Surgeon: Gaynelle Arabian, MD;  Location: [facility];  Service: Orthopedics;  Laterality: Right;  . Transsphenoidal excision pituitary tumor  05/1999   A.Tommi Rumps, M.D.([facility])    reports that he quit smoking about 29 years ago. His smoking use included pipe. He has never used smokeless tobacco. He reports that he drinks alcohol. He reports that he does not use drugs. Social History   Socioeconomic History  . Marital status: Widowed    Spouse name: Not on file  . Number of children: Not on file  . Years of education: Not on file  . Highest education level: Not on file  Occupational History  . Not on file  Social Needs  . Financial resource strain: Not on file  . Food insecurity:    Worry: Not on file    Inability: Not on file  . Transportation needs:    Medical: Not on file    Non-medical: Not on file  Tobacco Use  . Smoking status: Former Smoker    Types: Pipe    Last attempt to quit: 09/26/1988    Years since quitting: 29.1  . Smokeless tobacco: Never Used  . Tobacco comment: about 20 years  Substance and Sexual Activity  . Alcohol use: Yes    Comment: 1.5 ounces of vodka nightly   . Drug use: No  . Sexual activity: Not Currently  Lifestyle  . Physical activity:    Days per week: Not on file    Minutes per session: Not on file  . Stress: Not on file  Relationships  . Social connections:    Talks on phone: Not on file    Gets together: Not on file    Attends religious service: Not on file    Active member of club or organization: Not on file    Attends meetings  of clubs or organizations: Not on file    Relationship status: Not on file  . Intimate partner violence:    Fear of current or ex partner: Not on file    Emotionally abused: Not on file    Physically abused: Not on file    Forced sexual activity: Not on file  Other Topics Concern  . Not on file  Social History Narrative   Patient is Married Software engineer). Retired Engineer, mining) Administrator, sports. Lives in apartment, Independent Living section at [facility] since 2000.  Spouse lives in skilled nursing section in same community   Smoking 1994 , moderate alcohol use: 2 drinks a night. Exercises regularly with walking and exercise classes. Continues to drive   Patient has Advanced planning documents: Living Will                Functional Status Survey:    Family History  Problem Relation Age of Onset  . Lung cancer Father   . Anesthesia problems Neg Hx   . Hypotension Neg Hx   . Malignant hyperthermia Neg Hx   . Pseudochol deficiency Neg Hx     Health Maintenance  Topic Date Due  . URINE MICROALBUMIN  01/22/1935  . TETANUS/TDAP  10/27/2008  . INFLUENZA VACCINE  Completed  . PNA vac Low Risk Adult  Completed    Allergies  Allergen Reactions  . Percocet [Oxycodone-Acetaminophen] Other (See Comments)    Just doesn't like it  . Robaxin [Methocarbamol]   . Diovan [Valsartan] Rash    Outpatient Encounter Medications as of 11/21/2017  Medication Sig  . cefdinir (OMNICEF) 300 MG capsule Take 1 capsule (300 mg total) by mouth 2 (two) times daily.  . Cholecalciferol (VITAMIN D3) 5000 units TABS Take 1 tablet every day  . Coenzyme Q10 300 MG CAPS Take 1 capsule by mouth daily.  Marland Kitchen doxycycline (VIBRA-TABS) 100 MG tablet Take 1 tablet (100 mg total) by mouth 2 (two) times daily.  . finasteride (PROSCAR) 5 MG tablet Take 5 mg by mouth every morning.   . Flaxseed, Linseed, (EQL FLAXSEED OIL) 1200 MG CAPS Take as directed once a day  . lactobacillus  acidophilus & bulgar (LACTINEX) chewable tablet Chew 1 tablet by mouth 3 (three) times daily with meals.  . Multiple Vitamins-Minerals (MULTIVITAMIN & MINERAL PO) Take 1 tablet by mouth daily.  . Omega-3 Fatty Acids (FISH OIL) 1000 MG CAPS Take 1,000 mg by mouth daily.   . pantoprazole (PROTONIX) 40 MG tablet TAKE 1 TABLET BY MOUTH DAILY.  . pravastatin (PRAVACHOL) 80 MG tablet Take 80 mg by mouth every evening.   . predniSONE (DELTASONE) 5 MG tablet Take 2.5-5 mg by mouth 2 (two) times daily. Take 1 tablet (5 mg) in the am and Take 1/2 tablet (2.5 mg) in the evening.  Marland Kitchen SYNTHROID 88 MCG tablet Take 88 mcg by mouth daily before breakfast.   . testosterone cypionate (DEPOTESTOSTERONE CYPIONATE) 200 MG/ML injection Inject 30 mg (0.15 ml) intramuscularly weekly. Must discard vial after 90 days.  . vitamin B-12 (CYANOCOBALAMIN) 1000 MCG tablet Take 1,000 mcg by mouth daily.  Marland Kitchen UNABLE TO FIND Serum drops in both eyes as directed as needed   No facility-administered encounter medications on file as of 11/21/2017.     Review of Systems  Constitutional: Negative for activity change, appetite change, chills, fatigue and fever.  HENT: Negative for congestion and trouble swallowing.   Eyes: Negative for visual disturbance.  Respiratory: Negative for chest tightness and shortness of breath.   Cardiovascular: Negative for chest pain and leg swelling.  Gastrointestinal: Negative for constipation.  Genitourinary: Negative for dysuria.  Musculoskeletal: Positive for gait problem. Negative for joint swelling.  Skin: Negative for color change and rash.  Neurological: Negative for dizziness and weakness.  Hematological: Negative for adenopathy. Bruises/bleeds easily.  Psychiatric/Behavioral: Negative for confusion.    Vitals:   11/21/17 1004  BP: (!) 147/68  Pulse: 60  Resp: 18  Temp: 97.8 F (36.6 C)  TempSrc: Oral  Weight: 170 lb (77.1 kg)  Height:  5\' 11"  (1.803 m)   Body mass index is 23.71  kg/m. Physical Exam  Constitutional: He is oriented to person, place, and time. He appears well-developed and well-nourished. No distress.  HENT:  Head: Normocephalic and atraumatic.  HOH  Eyes: Pupils are equal, round, and reactive to light. Conjunctivae are normal.  glasses  Neck: Neck supple. No JVD present.  Cardiovascular: Normal rate, regular rhythm, normal heart sounds and intact distal pulses.  Pulmonary/Chest: Effort normal and breath sounds normal. No respiratory distress.  Abdominal: Soft. Bowel sounds are normal. He exhibits no distension. There is no tenderness.  Musculoskeletal: Normal range of motion.  Lymphadenopathy:    He has no cervical adenopathy.  Neurological: He is alert and oriented to person, place, and time.  Skin: Skin is warm and dry.  Right great toe MTP plantar surface with small wound  Psychiatric: He has a normal mood and affect.    Labs reviewed: Basic Metabolic Panel: Recent Labs    06/12/17 11/17/17 2326 11/19/17 0937  NA 140 140 138  K 4.0 3.6 3.8  CL  --  105 104  CO2  --  28 24  GLUCOSE  --  120* 171*  BUN 25* 26* 25*  CREATININE 1.3 1.37* 1.20  CALCIUM  --  9.1 8.9   Liver Function Tests: Recent Labs    06/12/17  AST 13*  ALT 14  ALKPHOS 46   No results for input(s): LIPASE, AMYLASE in the last 8760 hours. No results for input(s): AMMONIA in the last 8760 hours. CBC: Recent Labs    06/12/17 11/17/17 2326 11/19/17 0937  WBC 10.1 14.7* 19.2*  NEUTROABS  --  11.7*  --   HGB 13.7 10.9* 11.2*  HCT 41 33.2* 34.6*  MCV  --  97.1 100.0  PLT 254 270 322   Cardiac Enzymes: No results for input(s): CKTOTAL, CKMB, CKMBINDEX, TROPONINI in the last 8760 hours. BNP: Invalid input(s): POCBNP Lab Results  Component Value Date   HGBA1C 6.2 (H) 11/18/2017   Lab Results  Component Value Date   TSH <0.010 (L) 10/12/2016   Lab Results  Component Value Date   VITAMINB12 1,321 06/12/2017   No results found for: FOLATE No  results found for: IRON, TIBC, FERRITIN  Imaging and Procedures obtained prior to SNF admission: Dg Chest 2 View  Result Date: 11/18/2017 CLINICAL DATA:  Initial evaluation for acute fever. EXAM: CHEST - 2 VIEW COMPARISON:  Prior radiograph from 10/13/2016. FINDINGS: Mild cardiomegaly, stable. Mediastinal silhouette within normal limits. Aortic atherosclerosis. Lungs are hypoinflated. Mild bibasilar atelectatic changes and/or scarring. No consolidative airspace disease. No pulmonary edema or significant pleural effusion. No pneumothorax. Right shoulder arthroplasty noted.  No acute osseous abnormality. IMPRESSION: 1. Shallow lung inflation with mild bibasilar atelectasis and/or scarring. 2. No other active cardiopulmonary disease. 3. Aortic atherosclerosis. Electronically Signed   By: Jeannine Boga M.D.   On: 11/18/2017 01:28   Dg Foot Complete Right  Result Date: 11/17/2017 CLINICAL DATA:  Leg cellulitis and foot wound. EXAM: RIGHT FOOT COMPLETE - 3+ VIEW COMPARISON:  None. FINDINGS: Apart from some interval increase in soft tissue swelling about the base of the great toe, no significant change in the osseous appearance of the foot with severe osteoarthritic joint space narrowing of the first MTP with subchondral degenerative cystic change. No bony sequestrum is noted. Advanced degenerative changes are present of the second third TMT articulations. No acute fracture or foreign body. No soft tissue gas. No soft tissue tophus. Shallow  ulceration along the plantar aspect of the forefoot as before. IMPRESSION: Some progression of soft tissue swelling without frank bone destruction involving the forefoot and in particular the great toe. Redemonstration of severe osteoarthritis of the first MTP and mild to moderate degenerative change at the base of the second and third TMT joints. Electronically Signed   By: Ashley Royalty M.D.   On: 11/17/2017 23:22    Assessment/Plan 1. Cellulitis of right anterior  lower leg -resolved, completing abx, hopefully will not recur and MRI will be negative for osteomyelitis, but I am also concerned about this potential finding -pt not to delay in notification if he has another fever or erythema and swelling of right leg recur  2. Open wound of plantar aspect of foot, initial encounter -ongoing, cont wound care per Dr. Dellia Nims and [facility]  3. Adrenal insufficiency (HCC) -complete 4 days of increased prednisone due to this, then back to home dosage  -pt ok to d/c home in am tomorrow with f/u with Dr. Dellia Nims and myself with strict notification guidelines given  Family/ staff Communication: discussed with rehab nurse and pt  Labs/tests ordered:  No new, will order labs at f/u visit in next 2 wks  Marri Mcneff L. Mahlon Gabrielle, D.O. [facility] [address], [place] [address] Cell Phone (Mon-Fri 8am-5pm):  [phone] On Call:  [phone] & follow prompts after 5pm & weekends Office Phone:  [phone] Office Fax:  [phone]

## 2017-11-22 NOTE — Telephone Encounter (Signed)
Seen yesterday

## 2017-11-23 LAB — CULTURE, BLOOD (ROUTINE X 2)
CULTURE: NO GROWTH
Culture: NO GROWTH
Special Requests: ADEQUATE

## 2017-11-24 ENCOUNTER — Ambulatory Visit (HOSPITAL_COMMUNITY)
Admission: RE | Admit: 2017-11-24 | Discharge: 2017-11-24 | Disposition: A | Discharge status: Home / Self Care | Payer: PPO | Source: Ambulatory Visit | Attending: Internal Medicine | Admitting: Internal Medicine

## 2017-11-24 DIAGNOSIS — M009 Pyogenic arthritis, unspecified: Secondary | ICD-10-CM | POA: Insufficient documentation

## 2017-11-24 DIAGNOSIS — L97514 Non-pressure chronic ulcer of other part of right foot with necrosis of bone: Secondary | ICD-10-CM | POA: Diagnosis not present

## 2017-11-24 DIAGNOSIS — M7989 Other specified soft tissue disorders: Secondary | ICD-10-CM | POA: Diagnosis not present

## 2017-11-24 DIAGNOSIS — M19071 Primary osteoarthritis, right ankle and foot: Secondary | ICD-10-CM | POA: Diagnosis not present

## 2017-11-24 MED ORDER — GADOBENATE DIMEGLUMINE 529 MG/ML IV SOLN
20.0000 mL | Freq: Once | INTRAVENOUS | Status: AC | PRN
  Administered 2017-11-24: 17 mL via INTRAVENOUS

## 2017-11-27 ENCOUNTER — Encounter (HOSPITAL_BASED_OUTPATIENT_CLINIC_OR_DEPARTMENT_OTHER): Payer: PPO | Attending: Internal Medicine

## 2017-11-27 DIAGNOSIS — L97514 Non-pressure chronic ulcer of other part of right foot with necrosis of bone: Secondary | ICD-10-CM | POA: Insufficient documentation

## 2017-11-27 DIAGNOSIS — M86172 Other acute osteomyelitis, left ankle and foot: Secondary | ICD-10-CM | POA: Diagnosis not present

## 2017-11-27 DIAGNOSIS — G909 Disorder of the autonomic nervous system, unspecified: Secondary | ICD-10-CM | POA: Insufficient documentation

## 2017-11-27 DIAGNOSIS — L97516 Non-pressure chronic ulcer of other part of right foot with bone involvement without evidence of necrosis: Secondary | ICD-10-CM | POA: Diagnosis not present

## 2017-11-27 DIAGNOSIS — R609 Edema, unspecified: Secondary | ICD-10-CM | POA: Insufficient documentation

## 2017-11-27 DIAGNOSIS — G9009 Other idiopathic peripheral autonomic neuropathy: Secondary | ICD-10-CM | POA: Diagnosis not present

## 2017-11-29 ENCOUNTER — Emergency Department (HOSPITAL_COMMUNITY): Payer: PPO

## 2017-11-29 ENCOUNTER — Encounter (HOSPITAL_COMMUNITY): Payer: Self-pay

## 2017-11-29 ENCOUNTER — Other Ambulatory Visit: Payer: Self-pay

## 2017-11-29 ENCOUNTER — Inpatient Hospital Stay (HOSPITAL_COMMUNITY)
Admission: EM | Admit: 2017-11-29 | Discharge: 2017-12-01 | DRG: 540 | Disposition: A | Discharge status: Nursing Home (Includes ICF) | Payer: PPO | Attending: Family Medicine | Admitting: Family Medicine

## 2017-11-29 DIAGNOSIS — L97519 Non-pressure chronic ulcer of other part of right foot with unspecified severity: Secondary | ICD-10-CM | POA: Diagnosis not present

## 2017-11-29 DIAGNOSIS — M869 Osteomyelitis, unspecified: Secondary | ICD-10-CM | POA: Diagnosis not present

## 2017-11-29 DIAGNOSIS — Z85828 Personal history of other malignant neoplasm of skin: Secondary | ICD-10-CM

## 2017-11-29 DIAGNOSIS — Z905 Acquired absence of kidney: Secondary | ICD-10-CM

## 2017-11-29 DIAGNOSIS — Z79899 Other long term (current) drug therapy: Secondary | ICD-10-CM

## 2017-11-29 DIAGNOSIS — E893 Postprocedural hypopituitarism: Secondary | ICD-10-CM | POA: Diagnosis present

## 2017-11-29 DIAGNOSIS — Z885 Allergy status to narcotic agent status: Secondary | ICD-10-CM | POA: Diagnosis not present

## 2017-11-29 DIAGNOSIS — I129 Hypertensive chronic kidney disease with stage 1 through stage 4 chronic kidney disease, or unspecified chronic kidney disease: Secondary | ICD-10-CM | POA: Diagnosis not present

## 2017-11-29 DIAGNOSIS — L03115 Cellulitis of right lower limb: Secondary | ICD-10-CM | POA: Diagnosis not present

## 2017-11-29 DIAGNOSIS — M86071 Acute hematogenous osteomyelitis, right ankle and foot: Secondary | ICD-10-CM | POA: Diagnosis not present

## 2017-11-29 DIAGNOSIS — N400 Enlarged prostate without lower urinary tract symptoms: Secondary | ICD-10-CM | POA: Diagnosis present

## 2017-11-29 DIAGNOSIS — E2740 Unspecified adrenocortical insufficiency: Secondary | ICD-10-CM | POA: Diagnosis present

## 2017-11-29 DIAGNOSIS — Z79890 Hormone replacement therapy (postmenopausal): Secondary | ICD-10-CM

## 2017-11-29 DIAGNOSIS — E785 Hyperlipidemia, unspecified: Secondary | ICD-10-CM | POA: Diagnosis present

## 2017-11-29 DIAGNOSIS — Z96611 Presence of right artificial shoulder joint: Secondary | ICD-10-CM | POA: Diagnosis not present

## 2017-11-29 DIAGNOSIS — Z888 Allergy status to other drugs, medicaments and biological substances status: Secondary | ICD-10-CM

## 2017-11-29 DIAGNOSIS — A419 Sepsis, unspecified organism: Secondary | ICD-10-CM

## 2017-11-29 DIAGNOSIS — Z86010 Personal history of colonic polyps: Secondary | ICD-10-CM | POA: Diagnosis not present

## 2017-11-29 DIAGNOSIS — Z66 Do not resuscitate: Secondary | ICD-10-CM | POA: Diagnosis not present

## 2017-11-29 DIAGNOSIS — Z7952 Long term (current) use of systemic steroids: Secondary | ICD-10-CM

## 2017-11-29 DIAGNOSIS — E039 Hypothyroidism, unspecified: Secondary | ICD-10-CM | POA: Diagnosis not present

## 2017-11-29 DIAGNOSIS — Z85520 Personal history of malignant carcinoid tumor of kidney: Secondary | ICD-10-CM | POA: Diagnosis not present

## 2017-11-29 DIAGNOSIS — G629 Polyneuropathy, unspecified: Secondary | ICD-10-CM | POA: Diagnosis present

## 2017-11-29 DIAGNOSIS — E538 Deficiency of other specified B group vitamins: Secondary | ICD-10-CM | POA: Diagnosis not present

## 2017-11-29 DIAGNOSIS — L97514 Non-pressure chronic ulcer of other part of right foot with necrosis of bone: Secondary | ICD-10-CM | POA: Diagnosis not present

## 2017-11-29 DIAGNOSIS — Z9049 Acquired absence of other specified parts of digestive tract: Secondary | ICD-10-CM | POA: Diagnosis not present

## 2017-11-29 DIAGNOSIS — E2749 Other adrenocortical insufficiency: Secondary | ICD-10-CM | POA: Diagnosis present

## 2017-11-29 DIAGNOSIS — N183 Chronic kidney disease, stage 3 unspecified: Secondary | ICD-10-CM | POA: Diagnosis present

## 2017-11-29 DIAGNOSIS — Z85528 Personal history of other malignant neoplasm of kidney: Secondary | ICD-10-CM | POA: Diagnosis not present

## 2017-11-29 DIAGNOSIS — B370 Candidal stomatitis: Secondary | ICD-10-CM

## 2017-11-29 DIAGNOSIS — I444 Left anterior fascicular block: Secondary | ICD-10-CM | POA: Diagnosis not present

## 2017-11-29 DIAGNOSIS — M86671 Other chronic osteomyelitis, right ankle and foot: Secondary | ICD-10-CM | POA: Diagnosis not present

## 2017-11-29 DIAGNOSIS — I4510 Unspecified right bundle-branch block: Secondary | ICD-10-CM | POA: Diagnosis not present

## 2017-11-29 DIAGNOSIS — J309 Allergic rhinitis, unspecified: Secondary | ICD-10-CM | POA: Diagnosis present

## 2017-11-29 DIAGNOSIS — Z96653 Presence of artificial knee joint, bilateral: Secondary | ICD-10-CM | POA: Diagnosis present

## 2017-11-29 DIAGNOSIS — Z87891 Personal history of nicotine dependence: Secondary | ICD-10-CM

## 2017-11-29 DIAGNOSIS — E230 Hypopituitarism: Secondary | ICD-10-CM | POA: Diagnosis not present

## 2017-11-29 DIAGNOSIS — R509 Fever, unspecified: Secondary | ICD-10-CM | POA: Diagnosis not present

## 2017-11-29 DIAGNOSIS — L0390 Cellulitis, unspecified: Secondary | ICD-10-CM

## 2017-11-29 DIAGNOSIS — M65871 Other synovitis and tenosynovitis, right ankle and foot: Secondary | ICD-10-CM | POA: Diagnosis present

## 2017-11-29 DIAGNOSIS — Z91048 Other nonmedicinal substance allergy status: Secondary | ICD-10-CM

## 2017-11-29 LAB — URINALYSIS, ROUTINE W REFLEX MICROSCOPIC
Bilirubin Urine: NEGATIVE
GLUCOSE, UA: NEGATIVE mg/dL
HGB URINE DIPSTICK: NEGATIVE
Ketones, ur: NEGATIVE mg/dL
Leukocytes, UA: NEGATIVE
Nitrite: NEGATIVE
PH: 7 (ref 5.0–8.0)
Protein, ur: NEGATIVE mg/dL
SPECIFIC GRAVITY, URINE: 1.01 (ref 1.005–1.030)

## 2017-11-29 LAB — SEDIMENTATION RATE: Sed Rate: 35 mm/hr — ABNORMAL HIGH (ref 0–16)

## 2017-11-29 LAB — PROTIME-INR
INR: 1.26
PROTHROMBIN TIME: 15.7 s — AB (ref 11.4–15.2)

## 2017-11-29 LAB — COMPREHENSIVE METABOLIC PANEL
ALT: 13 U/L — AB (ref 17–63)
AST: 15 U/L (ref 15–41)
Albumin: 2.7 g/dL — ABNORMAL LOW (ref 3.5–5.0)
Alkaline Phosphatase: 51 U/L (ref 38–126)
Anion gap: 8 (ref 5–15)
BUN: 20 mg/dL (ref 6–20)
CHLORIDE: 100 mmol/L — AB (ref 101–111)
CO2: 26 mmol/L (ref 22–32)
CREATININE: 1.31 mg/dL — AB (ref 0.61–1.24)
Calcium: 8.4 mg/dL — ABNORMAL LOW (ref 8.9–10.3)
GFR calc Af Amer: 53 mL/min — ABNORMAL LOW (ref >60)
GFR, EST NON AFRICAN AMERICAN: 46 mL/min — AB (ref >60)
GLUCOSE: 88 mg/dL (ref 65–99)
Potassium: 3.7 mmol/L (ref 3.5–5.1)
Sodium: 134 mmol/L — ABNORMAL LOW (ref 135–145)
TOTAL PROTEIN: 5.5 g/dL — AB (ref 6.5–8.1)
Total Bilirubin: 1.3 mg/dL — ABNORMAL HIGH (ref 0.3–1.2)

## 2017-11-29 LAB — CBC WITH DIFFERENTIAL/PLATELET
BASOS PCT: 0 %
Basophils Absolute: 0 10*3/uL (ref 0.0–0.1)
EOS ABS: 0 10*3/uL (ref 0.0–0.7)
Eosinophils Relative: 0 %
HCT: 32.8 % — ABNORMAL LOW (ref 39.0–52.0)
Hemoglobin: 10.9 g/dL — ABNORMAL LOW (ref 13.0–17.0)
LYMPHS ABS: 3.1 10*3/uL (ref 0.7–4.0)
Lymphocytes Relative: 10 %
MCH: 32.8 pg (ref 26.0–34.0)
MCHC: 33.2 g/dL (ref 30.0–36.0)
MCV: 98.8 fL (ref 78.0–100.0)
MONO ABS: 0.9 10*3/uL (ref 0.1–1.0)
Monocytes Relative: 3 %
NEUTROS ABS: 26.5 10*3/uL — AB (ref 1.7–7.7)
Neutrophils Relative %: 87 %
PLATELETS: 227 10*3/uL (ref 150–400)
RBC: 3.32 MIL/uL — ABNORMAL LOW (ref 4.22–5.81)
RDW: 15.9 % — AB (ref 11.5–15.5)
WBC: 30.5 10*3/uL — ABNORMAL HIGH (ref 4.0–10.5)

## 2017-11-29 LAB — C-REACTIVE PROTEIN: CRP: 11.4 mg/dL — ABNORMAL HIGH (ref <1.0)

## 2017-11-29 LAB — I-STAT CG4 LACTIC ACID, ED
LACTIC ACID, VENOUS: 0.83 mmol/L (ref 0.5–1.9)
Lactic Acid, Venous: 0.83 mmol/L (ref 0.5–1.9)

## 2017-11-29 MED ORDER — PRAVASTATIN SODIUM 20 MG PO TABS
80.0000 mg | ORAL_TABLET | Freq: Every evening | ORAL | Status: DC
  Administered 2017-11-29 – 2017-11-30 (×2): 80 mg via ORAL
  Filled 2017-11-29 (×2): qty 4

## 2017-11-29 MED ORDER — SODIUM CHLORIDE 0.9 % IV BOLUS
1000.0000 mL | Freq: Once | INTRAVENOUS | Status: AC
  Administered 2017-11-29: 1000 mL via INTRAVENOUS

## 2017-11-29 MED ORDER — PREDNISONE 5 MG PO TABS
15.0000 mg | ORAL_TABLET | Freq: Every day | ORAL | Status: DC
  Administered 2017-11-30 – 2017-12-01 (×2): 15 mg via ORAL
  Filled 2017-11-29 (×3): qty 3

## 2017-11-29 MED ORDER — PANTOPRAZOLE SODIUM 40 MG PO TBEC
40.0000 mg | DELAYED_RELEASE_TABLET | Freq: Every day | ORAL | Status: DC
  Filled 2017-11-29 (×2): qty 1

## 2017-11-29 MED ORDER — ONDANSETRON HCL 4 MG PO TABS: 4.0000 mg | ORAL_TABLET | Freq: Four times a day (QID) | ORAL | Status: DC | PRN

## 2017-11-29 MED ORDER — OMEGA-3-ACID ETHYL ESTERS 1 G PO CAPS
1.0000 g | ORAL_CAPSULE | Freq: Every day | ORAL | Status: DC
  Administered 2017-11-30 – 2017-12-01 (×2): 1 g via ORAL
  Filled 2017-11-29 (×2): qty 1

## 2017-11-29 MED ORDER — PIPERACILLIN-TAZOBACTAM 3.375 G IVPB 30 MIN
3.3750 g | Freq: Once | INTRAVENOUS | Status: AC
  Administered 2017-11-29: 3.375 g via INTRAVENOUS
  Filled 2017-11-29: qty 50

## 2017-11-29 MED ORDER — ONDANSETRON HCL 4 MG/2ML IJ SOLN: 4.0000 mg | Freq: Four times a day (QID) | INTRAMUSCULAR | Status: DC | PRN

## 2017-11-29 MED ORDER — TESTOSTERONE CYPIONATE 200 MG/ML IM SOLN
30.0000 mg | INTRAMUSCULAR | Status: DC
  Administered 2017-12-01: 30 mg via INTRAMUSCULAR
  Filled 2017-11-29: qty 0.15

## 2017-11-29 MED ORDER — HYDROCORTISONE NA SUCCINATE PF 100 MG IJ SOLR
100.0000 mg | Freq: Once | INTRAMUSCULAR | Status: AC
  Administered 2017-11-29: 100 mg via INTRAVENOUS
  Filled 2017-11-29: qty 2

## 2017-11-29 MED ORDER — FINASTERIDE 5 MG PO TABS
5.0000 mg | ORAL_TABLET | Freq: Every morning | ORAL | Status: DC
  Administered 2017-11-30 – 2017-12-01 (×2): 5 mg via ORAL
  Filled 2017-11-29 (×2): qty 1

## 2017-11-29 MED ORDER — VITAMIN B-12 1000 MCG PO TABS
1000.0000 ug | ORAL_TABLET | Freq: Every day | ORAL | Status: DC
  Administered 2017-11-29 – 2017-11-30 (×2): 1000 ug via ORAL
  Filled 2017-11-29 (×2): qty 1

## 2017-11-29 MED ORDER — ENOXAPARIN SODIUM 40 MG/0.4ML ~~LOC~~ SOLN
40.0000 mg | SUBCUTANEOUS | Status: DC
  Administered 2017-11-29 – 2017-11-30 (×2): 40 mg via SUBCUTANEOUS
  Filled 2017-11-29 (×2): qty 0.4

## 2017-11-29 MED ORDER — DOCUSATE SODIUM 100 MG PO CAPS
100.0000 mg | ORAL_CAPSULE | Freq: Two times a day (BID) | ORAL | Status: DC
  Administered 2017-12-01: 100 mg via ORAL
  Filled 2017-11-29 (×4): qty 1

## 2017-11-29 MED ORDER — VANCOMYCIN HCL IN DEXTROSE 1-5 GM/200ML-% IV SOLN
1000.0000 mg | Freq: Once | INTRAVENOUS | Status: AC
Start: 2017-11-29 — End: 2017-11-29
  Administered 2017-11-29: 1000 mg via INTRAVENOUS
  Filled 2017-11-29: qty 200

## 2017-11-29 MED ORDER — COENZYME Q10 300 MG PO CAPS: 1.0000 | ORAL_CAPSULE | Freq: Every day | ORAL | Status: DC

## 2017-11-29 MED ORDER — LEVOTHYROXINE SODIUM 88 MCG PO TABS
88.0000 ug | ORAL_TABLET | Freq: Every day | ORAL | Status: DC
  Administered 2017-11-30 – 2017-12-01 (×2): 88 ug via ORAL
  Filled 2017-11-29 (×2): qty 1

## 2017-11-29 MED ORDER — PIPERACILLIN-TAZOBACTAM 3.375 G IVPB
3.3750 g | Freq: Three times a day (TID) | INTRAVENOUS | Status: DC
  Administered 2017-11-29 – 2017-11-30 (×3): 3.375 g via INTRAVENOUS
  Filled 2017-11-29 (×4): qty 50

## 2017-11-29 MED ORDER — VANCOMYCIN HCL IN DEXTROSE 750-5 MG/150ML-% IV SOLN
750.0000 mg | INTRAVENOUS | Status: DC
  Administered 2017-11-29: 750 mg via INTRAVENOUS
  Filled 2017-11-29 (×2): qty 150

## 2017-11-29 MED ORDER — ACETAMINOPHEN 325 MG PO TABS: 650.0000 mg | ORAL_TABLET | Freq: Four times a day (QID) | ORAL | Status: DC | PRN

## 2017-11-29 MED ORDER — PREDNISONE 5 MG PO TABS
7.5000 mg | ORAL_TABLET | Freq: Every evening | ORAL | Status: DC
  Administered 2017-11-30: 7.5 mg via ORAL
  Filled 2017-11-29 (×2): qty 2

## 2017-11-29 MED ORDER — NYSTATIN 100000 UNIT/ML MT SUSP
5.0000 mL | Freq: Four times a day (QID) | OROMUCOSAL | Status: DC
Start: 2017-11-29 — End: 2017-12-01
  Administered 2017-11-29 – 2017-12-01 (×5): 500000 [IU] via ORAL
  Filled 2017-11-29 (×5): qty 5

## 2017-11-29 MED ORDER — LACTINEX PO CHEW
1.0000 | CHEWABLE_TABLET | Freq: Three times a day (TID) | ORAL | Status: DC
  Administered 2017-11-30 – 2017-12-01 (×5): 1 via ORAL
  Filled 2017-11-29 (×7): qty 1

## 2017-11-29 NOTE — ED Notes (Signed)
Hospitalist at bedside 

## 2017-11-29 NOTE — H&P (Addendum)
History and Physical    [employer]. OVF:[id number] DOB: [birth year] DOA: 11/29/2017  Referring MD/NP/PA: Duffy Bruce PCP: [name], DO   Patient coming from:  [facility].  Uses cane but not walker  Chief Complaint: foot pain  HPI: [name] [name]. is a 84 y.o. male with history of panhypopituitarism, renal cell carcinoma, recurrent cellulitis of the right foot who presents with increasing fevers and right foot pain.  The patient was admitted from 3/22-3/24 due to right foot cellulitis.  He was discharged on doxycycline and [facility] skilled nursing facility.  He had been noted to have an elevated sed rate.  MRI was performed as an outpatient on 3/29 which demonstrated a soft tissue ulcer of the plantar aspect of the first metatarsal head with associated severe soft tissue and edema, evidence of osteomyelitis of the lateral hallux sesamoid bone and possibly of the medial hallux sesamoid.  He also had severe joint space narrowing at the first MTP joint consistent with severe osteoarthritis versus prior septic arthritis.  There was severe tendinosis of the flexor digitorum longus with high-grade partial-thickness tear at the level of the first MTP joint.  Despite compliance with his oral antibiotics, he developed recurrent fevers to greater than 101F on 4/2 with fatigue and was brought back to the emergency department on 4/3.  He denied worsening pain or swelling of his right foot.  He had some mild nausea but denied vomiting or diarrhea, cough, sinus congestion.  He has had a mild sore throat  ED Course: Vital signs essentially stable.  Labs: White blood cell count 30.5.  Lactic acid negative.  He was started on vancomycin and Zosyn.  Blood cultures were obtained.  The emergency department physician contacted Dr. Linus Salmons from infectious disease for assistance in management.  Blood cultures from his previous admission remained no growth.  Review of Systems:  Mild sore  throat Complete 12 point review of systems reviewed with patient and negative except as mentioned above.    Past Medical History:  Diagnosis Date  . Allergic rhinitis    Prior allergy shots 20 years  . Anemia   . Anemia, B12 deficiency 2000  . Arthritis    Status post left total replacement 8 2011  . BPH (benign prostatic hyperplasia) 2013  . Cancer ([place])    renal cell ca and skin cancer   . Colitis 2014  . Difficult intubation 1994   surgery had to be  stopped due to injury to "throat"  . Difficult intubation 1994   no trouble since.  Required nasotracheal intubation  '02 [facility] / ANESTHESIA RECORD FROM 2013 AND 2016 IN EPIC  . Glaucoma 2007   Status post left trabeculectomy 2007  . History of colon polyps    Colonoscopy 2001  . History of kidney stones   . History of renal cell carcinoma 1994   Status post right nephrectomy  . Hyperlipidemia 2003  . Hypertension   . 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 Hypothyroidism (acquired) 2000  . Nocturia   . Pituitary mass ([place]) 2000   S/p transphenoidal excision 05/1999 ([facility])  . Vitamin D deficiency 2009    Past Surgical History:  Procedure Laterality Date  . BRAIN SURGERY  2000   pituatary gland removed .  Marland Kitchen CHOLECYSTECTOMY  1984  . Ectropion surgery Bilateral 2006  . EYE SURGERY  over last 6 yrs.     trabeculectomy...   Marland Kitchen  EYE SURGERY   cat ext ou  . JOINT REPLACEMENT  2011   knee left  . KNEE ARTHROSCOPY Right 02/06/2015   Procedure: RIGHT ARTHROSCOPY KNEE WITH LATERAL MENSICAL  DEBRIDEMENT;  Surgeon: Gaynelle Arabian, MD;  Location: [facility];  Service: Orthopedics;  Laterality: Right;  . Mohs procedure      for skin cancer on nose   . NEPHRECTOMY Right 1994   Renal cell  . REVERSE SHOULDER ARTHROPLASTY  12/15/2011   Procedure: REVERSE SHOULDER ARTHROPLASTY;  Surgeon: Marin Shutter, MD;  Location: [facility];  Service: Orthopedics;  Laterality: Right;  right total reverse  shoulder  . TONSILLECTOMY    . TOTAL KNEE ARTHROPLASTY Right 06/29/2015   Procedure: TOTAL KNEE ARTHROPLASTY;  Surgeon: Gaynelle Arabian, MD;  Location: [facility];  Service: Orthopedics;  Laterality: Right;  . Transsphenoidal excision pituitary tumor  05/1999   A.Tommi Rumps, M.D.([facility])     reports that he quit smoking about 29 years ago. His smoking use included pipe. He has never used smokeless tobacco. He reports that he drinks alcohol. He reports that he does not use drugs.  Allergies  Allergen Reactions  . Adhesive [Tape]     CAUSES SKIN TEARS   . Percocet [Oxycodone-Acetaminophen] Other (See Comments)    Just doesn't like it  . Robaxin [Methocarbamol]   . Diovan [Valsartan] Rash    Family History  Problem Relation Age of Onset  . Lung cancer Father   . Anesthesia problems Neg Hx   . Hypotension Neg Hx   . Malignant hyperthermia Neg Hx   . Pseudochol deficiency Neg Hx     Prior to Admission medications   Medication Sig Start Date End Date Taking? Authorizing Provider  acetaminophen (TYLENOL) 500 MG tablet Take 1,000 mg by mouth daily as needed for fever.   Yes [provider]  cefdinir (OMNICEF) 300 MG capsule Take 1 capsule (300 mg total) by mouth 2 (two) times daily. 11/19/17  Yes Emokpae, Courage, MD  Cholecalciferol (VITAMIN D3) 5000 units TABS Take 1 tablet every day   Yes [provider]  Coenzyme Q10 300 MG CAPS Take 1 capsule by mouth daily.   Yes [provider]  doxycycline (VIBRA-TABS) 100 MG tablet Take 1 tablet (100 mg total) by mouth 2 (two) times daily. 11/19/17  Yes Emokpae, Courage, MD  finasteride (PROSCAR) 5 MG tablet Take 5 mg by mouth every morning.    Yes [provider]  Flaxseed, Linseed, (EQL FLAXSEED OIL) 1200 MG CAPS Take as directed once a day   Yes [provider]  lactobacillus acidophilus & bulgar (LACTINEX) chewable tablet Chew 1 tablet by mouth 3 (three) times daily with meals. 11/19/17  Yes Emokpae,  Courage, MD  Multiple Vitamins-Minerals (MULTIVITAMIN & MINERAL PO) Take 1 tablet by mouth daily.   Yes [provider]  Omega-3 Fatty Acids (FISH OIL) 1000 MG CAPS Take 1,000 mg by mouth daily.    Yes [provider]  pantoprazole (PROTONIX) 40 MG tablet TAKE 1 TABLET BY MOUTH DAILY. 10/17/17  Yes Reed, Tiffany L, DO  pravastatin (PRAVACHOL) 80 MG tablet Take 80 mg by mouth every evening.    Yes [provider]  predniSONE (DELTASONE) 5 MG tablet Take 2.5-5 mg by mouth 2 (two) times daily. Take 1 tablet (5 mg) in the am and Take 1/2 tablet (2.5 mg) in the evening.   Yes [provider]  SYNTHROID 88 MCG tablet Take 88 mcg by mouth daily before breakfast.  12/26/16  Yes [provider]  testosterone cypionate (DEPOTESTOSTERONE CYPIONATE) 200 MG/ML injection Inject 30 mg (0.15 ml) intramuscularly weekly. Must discard vial after 90 days. 01/25/17  Yes [provider]  vitamin B-12 (CYANOCOBALAMIN) 1000 MCG tablet Take 1,000 mcg by mouth daily.   Yes [provider]    Physical Exam: Vitals:   11/29/17 1430 11/29/17 1442 11/29/17 1500 11/29/17 1501  BP: (!) 110/53 (!) 110/53 117/77   Pulse: 64 72 75   Resp: 16 19 (!) 24 17  Temp:      TempSrc:      SpO2: 91% 90% 94%   Weight:      Height:        Constitutional:  NAD, calm, comfortable Eyes: PERRL, lids and conjunctivae normal ENMT:  Moist mucous membranes.  Oropharynx nonerythematous, white plaques on tongue and on soft palate Neck:  No nuchal rigidity, no masses Respiratory:  No wheezes, rales, or rhonchi Cardiovascular: Regular rate and rhythm, no murmurs / rubs / gallops.  2+ radial pulses. Abdomen:  Normal active bowel sounds, soft, nondistended, nontender Musculoskeletal: Normal muscle tone and bulk.  No contractures.  1+ pitting edema of the left lower extremity and 2+ pitting edema of the right lower extremity Skin: Abrasion on the right shin and ulcer on the plantar  aspect of the right foot, see pictures below.  Erythema surrounding the right shin Neurologic:  CN 2-12 grossly intact. Sensation intact to light touch, strength 5/5 throughout Psychiatric:  Alert and oriented x 3. Normal affect.            Labs on Admission: I have personally reviewed following labs and imaging studies  CBC: Recent Labs  Lab 11/29/17 1210  WBC 30.5*  NEUTROABS 26.5*  HGB 10.9*  HCT 32.8*  MCV 98.8  PLT 195   Basic Metabolic Panel: Recent Labs  Lab 11/29/17 1210  NA 134*  K 3.7  CL 100*  CO2 26  GLUCOSE 88  BUN 20  CREATININE 1.31*  CALCIUM 8.4*   GFR: Estimated Creatinine Clearance: 38.1 mL/min (A) (by C-G formula based on SCr of 1.31 mg/dL (H)). Liver Function Tests: Recent Labs  Lab 11/29/17 1210  AST 15  ALT 13*  ALKPHOS 51  BILITOT 1.3*  PROT 5.5*  ALBUMIN 2.7*   No results for input(s): LIPASE, AMYLASE in the last 168 hours. No results for input(s): AMMONIA in the last 168 hours. Coagulation Profile: Recent Labs  Lab 11/29/17 1210  INR 1.26   Cardiac Enzymes: No results for input(s): CKTOTAL, CKMB, CKMBINDEX, TROPONINI in the last 168 hours. BNP (last 3 results) No results for input(s): PROBNP in the last 8760 hours. HbA1C: No results for input(s): HGBA1C in the last 72 hours. CBG: No results for input(s): GLUCAP in the last 168 hours. Lipid Profile: No results for input(s): CHOL, HDL, LDLCALC, TRIG, CHOLHDL, LDLDIRECT in the last 72 hours. Thyroid Function Tests: No results for input(s): TSH, T4TOTAL, FREET4, T3FREE, THYROIDAB in the last 72 hours. Anemia Panel: No results for input(s): VITAMINB12, FOLATE, FERRITIN, TIBC, IRON, RETICCTPCT in the last 72 hours. Urine analysis:    Component Value Date/Time   COLORURINE YELLOW 11/29/2017 1210   APPEARANCEUR CLEAR 11/29/2017 1210   LABSPEC 1.010 11/29/2017 1210   PHURINE 7.0 11/29/2017 1210   GLUCOSEU NEGATIVE 11/29/2017 1210   HGBUR NEGATIVE 11/29/2017 1210    BILIRUBINUR NEGATIVE 11/29/2017 1210   KETONESUR NEGATIVE 11/29/2017 1210   PROTEINUR NEGATIVE 11/29/2017 1210   UROBILINOGEN 0.2 06/24/2015 1134  NITRITE NEGATIVE 11/29/2017 1210   LEUKOCYTESUR NEGATIVE 11/29/2017 1210   Sepsis Labs: @LABRCNTIP (procalcitonin:4,lacticidven:4) )No results found for this or any previous visit (from the past 240 hour(s)).   Radiological Exams on Admission: Dg Chest Port 1 View  Result Date: 11/29/2017 CLINICAL DATA:  Code sepsis. Inflammatory changes of the right lower extremity. Fever. EXAM: PORTABLE CHEST 1 VIEW COMPARISON:  Chest x-ray of November 18, 2017 FINDINGS: The lungs are well-expanded. Small amounts of pleural fluid are likely present at both lung bases. There is no alveolar infiltrate. The cardiac silhouette is enlarged. The pulmonary vascularity is normal. There is calcification in the wall of the aortic arch. The trachea is midline. The bony thorax exhibits no acute abnormality. IMPRESSION: Probable small bilateral pleural effusions. No alveolar pneumonia nor CHF. Mild cardiomegaly. Thoracic aortic atherosclerosis. Electronically Signed   By: [name]  Martinique M.D.   On: 11/29/2017 13:00    EKG: Independently reviewed.  Right bundle branch block with left anterior fascicular block, stable from prior.  Sinus rhythm.  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 Dellia Nims.  Severe leukocystosis despite Sed rate decreasing - Agree with vancomycin and Zosyn - Infectious disease consultation pending - Orthopedics consult due to torn tendon, ongoing osteomyelitis, Dr. 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 Linus Salmons, infectious disease  And Dr. Rush Farmer, Orthopedic surgery Admission status: inpatient, med-surg.  May need PICC line for long course of antibiotics.  Per ortho, will likely not need surgery during this admission.    Janece Canterbury MD [facility] Pager [phone]  If 7PM-7AM, please contact night-coverage [email or web address] Password [facility]  11/29/2017, 6:31 PM

## 2017-11-29 NOTE — ED Triage Notes (Signed)
Pt received 1000mg  of Tylenol at facility around 0930. Pt received 1L of Normal Saline by [facility]. Pt is not complaining of any pain. Pt can walk however PCP does not want any pressure put on leg. Pt is AOx4.

## 2017-11-29 NOTE — Progress Notes (Signed)
Pharmacy Antibiotic Note  [name] [name]. is a 84 y.o. male admitted on 11/29/2017 with osteomyelitis of the R foot. Pharmacy has been consulted for Vancomycin and Zosyn dosing.  Plan:  Vancomycin 1000 mg IV now, then 750 mg IV q24 hr (est AUC 426 based on SCr 1.31)  Measure vancomycin AUC at steady state as indicated  Zosyn 3.375 g IV given once over 30 minutes, then every 8 hrs by 4-hr infusion   Height: 5\' 11"  (180.3 cm) Weight: 165 lb (74.8 kg) IBW/kg (Calculated) : 75.3  Temp (24hrs), Avg:98.5 F (36.9 C), Min:98.5 F (36.9 C), Max:98.5 F (36.9 C)  Recent Labs  Lab 11/29/17 1210 11/29/17 1225 11/29/17 1358  WBC 30.5*  --   --   CREATININE 1.31*  --   --   LATICACIDVEN  --  0.83 0.83    Estimated Creatinine Clearance: 38.1 mL/min (A) (by C-G formula based on SCr of 1.31 mg/dL (H)).    Allergies  Allergen Reactions  . Adhesive [Tape]     CAUSES SKIN TEARS   . Percocet [Oxycodone-Acetaminophen] Other (See Comments)    Just doesn't like it  . Robaxin [Methocarbamol]   . Diovan [Valsartan] Rash    Antimicrobials this admission: Vancomycin 4/3 >>  Zosyn 4/3 >>   Dose adjustments this admission: n/a  Microbiology results: 4/3 BCx: sent 4/3 UCx: sent    Thank you for allowing pharmacy to be a part of this patient's care.  Reuel Boom, PharmD, BCPS [phone] 11/29/2017, 4:29 PM

## 2017-11-29 NOTE — Progress Notes (Addendum)
PHARMACIST - PHYSICIAN ORDER COMMUNICATION  CONCERNING: P&T Medication Policy on Herbal Medications  DESCRIPTION:  This patient's order for:  Coenzyme Q10  has been noted.  This product(s) is classified as an "herbal" or natural product. Due to a lack of definitive safety studies or FDA approval, nonstandard manufacturing practices, plus the potential risk of unknown drug-drug interactions while on inpatient medications, the Pharmacy and Therapeutics Committee does not permit the use of "herbal" or natural products of this type within [facility].   ACTION TAKEN: The pharmacy department is unable to verify this order at this time and your patient has been informed of this safety policy. Please reevaluate patient's clinical condition at discharge and address if the herbal or natural product(s) should be resumed at that time.   Reuel Boom, PharmD, BCPS [phone] 11/29/2017, 3:58 PM

## 2017-11-29 NOTE — ED Provider Notes (Signed)
[facility] DEPT Provider Note   CSN: [id number] Arrival date & time: 11/29/17  1132     History   Chief Complaint Chief Complaint  Patient presents with  . Fever    HPI [name] [name]. is a 84 y.o. male.  HPI   84 year old male with extensive past medical history below including chronic adrenal insufficiency and recent admission for cellulitis here with recurrent fever.  Since discharge, patient has been on Omnicef and doxycycline.  He recently underwent MRI on 3/29 which showed significant osteomyelitis and tendinitis.  Over the last 24 hours, patient had recurrence of fever up to 101.5.  He had associated nausea and chills.  Has had poor appetite.  He had increasing pain in his right leg as well, which is the leg that he was recently treated for.  Of note, he has not seen infectious disease.  Cultures were negative during his last hospitalization.  He is otherwise been taking his medications as prescribed.  Denies any pain currently.  Denies any worsening factors.  He has been taking his antibiotics as said.  No cough or sputum production.  No abdominal pain, nausea, or vomiting.  Past Medical History:  Diagnosis Date  . Allergic rhinitis    Prior allergy shots 20 years  . Anemia   . Anemia, B12 deficiency 2000  . Arthritis    Status post left total replacement 8 2011  . BPH (benign prostatic hyperplasia) 2013  . Cancer ([place])    renal cell ca and skin cancer   . Colitis 2014  . Difficult intubation 1994   surgery had to be  stopped due to injury to "throat"  . Difficult intubation 1994   no trouble since.  Required nasotracheal intubation  '02 [facility] / ANESTHESIA RECORD FROM 2013 AND 2016 IN EPIC  . Glaucoma 2007   Status post left trabeculectomy 2007  . History of colon polyps    Colonoscopy 2001  . History of kidney stones   . History of renal cell carcinoma 1994   Status post right nephrectomy  . Hyperlipidemia 2003  . Hypertension     . 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 Hypothyroidism (acquired) 2000  . Nocturia   . Pituitary mass ([place]) 2000   S/p transphenoidal excision 05/1999 ([facility])  . Vitamin D deficiency 2009    Patient Active Problem List   Diagnosis Date Noted  . Ulcer of right foot with fat layer exposed ([place]) 11/18/2017  . Cellulitis of right foot 11/18/2017  . Cellulitis of right ankle 11/18/2017  . Sepsis ([place]) 11/18/2017  . Adrenal insufficiency ([place]) 07/01/2017  . Bulging of intervertebral disc between L4 and L5 07/01/2017  . At high risk for bleeding 07/01/2017  . Right buttock pain 02/08/2017  . Leukocytosis 10/12/2016  . HLD (hyperlipidemia) 04/20/2016  . BPH (benign prostatic hyperplasia) 12/08/2015  . CKD (chronic kidney disease) stage 3, GFR 30-59 ml/min (HCC) 11/27/2015  . S/P total knee arthroplasty 07/13/2015  . OA (osteoarthritis) of knee 06/29/2015  . Ectropion 05/06/2015  . Lateral meniscal tear 02/05/2015  . Hyponatremia 10/20/2013  . Weakness 10/19/2013  . Hypertension   . 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 Hypothyroidism (acquired)   . Anemia, B12 deficiency   . Meibomian gland disease 08/06/2013  . Primary open angle glaucoma 06/19/2013  . Arthritis of shoulder region, right 12/15/2011  . Exposure keratitis 08/17/2011  . Dry eye 07/13/2011  Past Surgical History:  Procedure Laterality Date  . BRAIN SURGERY  2000   pituatary gland removed .  Marland Kitchen CHOLECYSTECTOMY  1984  . Ectropion surgery Bilateral 2006  . EYE SURGERY  over last 6 yrs.     trabeculectomy...   . EYE SURGERY   cat ext ou  . JOINT REPLACEMENT  2011   knee left  . KNEE ARTHROSCOPY Right 02/06/2015   Procedure: RIGHT ARTHROSCOPY KNEE WITH LATERAL MENSICAL  DEBRIDEMENT;  Surgeon: Gaynelle Arabian, MD;  Location: [facility];  Service: Orthopedics;  Laterality: Right;  . Mohs procedure      for skin cancer on nose   . NEPHRECTOMY Right  1994   Renal cell  . REVERSE SHOULDER ARTHROPLASTY  12/15/2011   Procedure: REVERSE SHOULDER ARTHROPLASTY;  Surgeon: Marin Shutter, MD;  Location: [place] [facility];  Service: Orthopedics;  Laterality: Right;  right total reverse shoulder  . TONSILLECTOMY    . TOTAL KNEE ARTHROPLASTY Right 06/29/2015   Procedure: TOTAL KNEE ARTHROPLASTY;  Surgeon: Gaynelle Arabian, MD;  Location: [facility];  Service: Orthopedics;  Laterality: Right;  . Transsphenoidal excision pituitary tumor  05/1999   A.Tommi Rumps, M.D.([facility])        Home Medications    Prior to Admission medications   Medication Sig Start Date End Date Taking? Authorizing Provider  acetaminophen (TYLENOL) 500 MG tablet Take 1,000 mg by mouth daily as needed for fever.   Yes [provider]  cefdinir (OMNICEF) 300 MG capsule Take 1 capsule (300 mg total) by mouth 2 (two) times daily. 11/19/17  Yes Emokpae, Courage, MD  Cholecalciferol (VITAMIN D3) 5000 units TABS Take 1 tablet every day   Yes [provider]  Coenzyme Q10 300 MG CAPS Take 1 capsule by mouth daily.   Yes [provider]  doxycycline (VIBRA-TABS) 100 MG tablet Take 1 tablet (100 mg total) by mouth 2 (two) times daily. 11/19/17  Yes Emokpae, Courage, MD  finasteride (PROSCAR) 5 MG tablet Take 5 mg by mouth every morning.    Yes [provider]  Flaxseed, Linseed, (EQL FLAXSEED OIL) 1200 MG CAPS Take as directed once a day   Yes [provider]  lactobacillus acidophilus & bulgar (LACTINEX) chewable tablet Chew 1 tablet by mouth 3 (three) times daily with meals. 11/19/17  Yes Emokpae, Courage, MD  Multiple Vitamins-Minerals (MULTIVITAMIN & MINERAL PO) Take 1 tablet by mouth daily.   Yes [provider]  Omega-3 Fatty Acids (FISH OIL) 1000 MG CAPS Take 1,000 mg by mouth daily.    Yes [provider]  pantoprazole (PROTONIX) 40 MG tablet TAKE 1 TABLET BY MOUTH DAILY. 10/17/17  Yes Reed, Tiffany L, DO  pravastatin (PRAVACHOL) 80 MG tablet  Take 80 mg by mouth every evening.    Yes [provider]  predniSONE (DELTASONE) 5 MG tablet Take 2.5-5 mg by mouth 2 (two) times daily. Take 1 tablet (5 mg) in the am and Take 1/2 tablet (2.5 mg) in the evening.   Yes [provider]  SYNTHROID 88 MCG tablet Take 88 mcg by mouth daily before breakfast.  12/26/16  Yes [provider]  testosterone cypionate (DEPOTESTOSTERONE CYPIONATE) 200 MG/ML injection Inject 30 mg (0.15 ml) intramuscularly weekly. Must discard vial after 90 days. 01/25/17  Yes [provider]  vitamin B-12 (CYANOCOBALAMIN) 1000 MCG tablet Take 1,000 mcg by mouth daily.   Yes [provider]    Family History Family History  Problem Relation Age of Onset  . Lung  cancer Father   . Anesthesia problems Neg Hx   . Hypotension Neg Hx   . Malignant hyperthermia Neg Hx   . Pseudochol deficiency Neg Hx     Social History Social History   Tobacco Use  . Smoking status: Former Smoker    Types: Pipe    Last attempt to quit: 09/26/1988    Years since quitting: 29.1  . Smokeless tobacco: Never Used  . Tobacco comment: about 20 years  Substance Use Topics  . Alcohol use: Yes    Comment: 1.5 ounces of vodka nightly   . Drug use: No     Allergies   Adhesive [tape]; Percocet [oxycodone-acetaminophen]; Robaxin [methocarbamol]; and Diovan [valsartan]   Review of Systems Review of Systems  Constitutional: Positive for chills, fatigue and fever.  HENT: Negative for congestion and rhinorrhea.   Eyes: Negative for visual disturbance.  Respiratory: Negative for cough, shortness of breath and wheezing.   Cardiovascular: Negative for chest pain and leg swelling.  Gastrointestinal: Negative for abdominal pain, diarrhea, nausea and vomiting.  Genitourinary: Negative for dysuria and flank pain.  Musculoskeletal: Positive for arthralgias and gait problem. Negative for neck pain and neck stiffness.  Skin: Negative for rash and wound.   Allergic/Immunologic: Negative for immunocompromised state.  Neurological: Positive for weakness. Negative for syncope and headaches.  All other systems reviewed and are negative.    Physical Exam Updated Vital Signs BP (!) 134/49   Pulse 63   Temp 98.5 F (36.9 C) (Oral)   Resp 13   Ht 5\' 11"  (1.803 m)   Wt 74.8 kg (165 lb)   SpO2 98%   BMI 23.01 kg/m   Physical Exam  Constitutional: He is oriented to person, place, and time. He appears well-developed and well-nourished. No distress.  HENT:  Head: Normocephalic and atraumatic.  Dry mucous membranes  Eyes: Conjunctivae are normal.  Neck: Neck supple.  Cardiovascular: Normal rate, regular rhythm and normal heart sounds. Exam reveals no friction rub.  No murmur heard. Pulmonary/Chest: Effort normal and breath sounds normal. No respiratory distress. He has no wheezes. He has no rales.  Normal work of breathing  Abdominal: He exhibits no distension.  Musculoskeletal: He exhibits no edema.  Neurological: He is alert and oriented to person, place, and time. He exhibits normal muscle tone.  Skin: Skin is warm. Capillary refill takes less than 2 seconds.  Psychiatric: He has a normal mood and affect.  Nursing note and vitals reviewed.   LOWER EXTREMITY EXAM: RIGHT  INSPECTION & PALPATION: Dry, necrotic ulcer to base of first toe on plantar aspect of foot. No drainage. There is significant erythema starting at the ankle proximal to just distal to the knee. Redness does not seem to connect to ulcer.  SENSORY: sensation is intact to light touch in:  Superficial peroneal nerve distribution (over dorsum of foot) Deep peroneal nerve distribution (over first dorsal web space) Sural nerve distribution (over lateral aspect 5th metatarsal) Saphenous nerve distribution (over medial instep)  MOTOR:  + Motor EHL (great toe dorsiflexion) + FHL (great toe plantar flexion)  + TA (ankle dorsiflexion)  + GSC (ankle plantar  flexion)  VASCULAR: 2+ dorsalis pedis and posterior tibialis pulses Capillary refill < 2 sec, toes warm and well-perfused  COMPARTMENTS: Soft, warm, well-perfused No pain with passive extension No parethesias   ED Treatments / Results  Labs (all labs ordered are listed, but only abnormal results are displayed) Labs Reviewed  COMPREHENSIVE METABOLIC PANEL - Abnormal; Notable for the  following components:      Result Value   Sodium 134 (*)    Chloride 100 (*)    Creatinine, Ser 1.31 (*)    Calcium 8.4 (*)    Total Protein 5.5 (*)    Albumin 2.7 (*)    ALT 13 (*)    Total Bilirubin 1.3 (*)    GFR calc non Af Amer 46 (*)    GFR calc Af Amer 53 (*)    All other components within normal limits  CBC WITH DIFFERENTIAL/PLATELET - Abnormal; Notable for the following components:   WBC 30.5 (*)    RBC 3.32 (*)    Hemoglobin 10.9 (*)    HCT 32.8 (*)    RDW 15.9 (*)    Neutro Abs 26.5 (*)    All other components within normal limits  PROTIME-INR - Abnormal; Notable for the following components:   Prothrombin Time 15.7 (*)    All other components within normal limits  SEDIMENTATION RATE - Abnormal; Notable for the following components:   Sed Rate 35 (*)    All other components within normal limits  CULTURE, BLOOD (ROUTINE X 2)  CULTURE, BLOOD (ROUTINE X 2)  URINE CULTURE  URINALYSIS, ROUTINE W REFLEX MICROSCOPIC  C-REACTIVE PROTEIN  I-STAT CG4 LACTIC ACID, ED  I-STAT CG4 LACTIC ACID, ED  I-STAT CG4 LACTIC ACID, ED  I-STAT CG4 LACTIC ACID, ED    EKG EKG Interpretation  Date/Time:  Wednesday November 29 2017 11:53:58 EDT Ventricular Rate:  73 PR Interval:    QRS Duration: 145 QT Interval:  421 QTC Calculation: 464 R Axis:   -59 Text Interpretation:  Sinus rhythm RBBB and LAFB No significant change since last tracing Confirmed by Duffy Bruce [id number]) on 11/29/2017 12:15:28 PM Also confirmed by Duffy Bruce [phone]), editor Philomena Doheny [phone])  on 11/29/2017 12:33:24  PM   Radiology Dg Chest Port 1 View  Result Date: 11/29/2017 CLINICAL DATA:  Code sepsis. Inflammatory changes of the right lower extremity. Fever. EXAM: PORTABLE CHEST 1 VIEW COMPARISON:  Chest x-ray of November 18, 2017 FINDINGS: The lungs are well-expanded. Small amounts of pleural fluid are likely present at both lung bases. There is no alveolar infiltrate. The cardiac silhouette is enlarged. The pulmonary vascularity is normal. There is calcification in the wall of the aortic arch. The trachea is midline. The bony thorax exhibits no acute abnormality. IMPRESSION: Probable small bilateral pleural effusions. No alveolar pneumonia nor CHF. Mild cardiomegaly. Thoracic aortic atherosclerosis. Electronically Signed   By: [name]  Martinique M.D.   On: 11/29/2017 13:00    Procedures .Critical Care Performed by: Duffy Bruce, MD Authorized by: Duffy Bruce, MD   Critical care provider statement:    Critical care time (minutes):  45   Critical care time was exclusive of:  Separately billable procedures and treating other patients and teaching time   Critical care was necessary to treat or prevent imminent or life-threatening deterioration of the following conditions:  Dehydration, sepsis and circulatory failure   Critical care was time spent personally by me on the following activities:  Development of treatment plan with patient or surrogate, discussions with consultants, evaluation of patient's response to treatment, examination of patient, obtaining history from patient or surrogate, ordering and performing treatments and interventions, ordering and review of laboratory studies, ordering and review of radiographic studies, pulse oximetry, re-evaluation of patient's condition and review of old charts   I assumed direction of critical care for this patient from another provider in my specialty:  no     (including critical care time)  Medications Ordered in ED Medications  piperacillin-tazobactam  (ZOSYN) IVPB 3.375 g (0 g Intravenous Stopped 11/29/17 1349)  vancomycin (VANCOCIN) IVPB 1000 mg/200 mL premix (0 mg Intravenous Stopped 11/29/17 1349)  hydrocortisone sodium succinate (SOLU-CORTEF) 100 MG injection 100 mg (100 mg Intravenous Given 11/29/17 1241)  sodium chloride 0.9 % bolus 1,000 mL (0 mLs Intravenous Stopped 11/29/17 1349)  sodium chloride 0.9 % bolus 1,000 mL (0 mLs Intravenous Stopped 11/29/17 1349)     Initial Impression / Assessment and Plan / ED Course  I have reviewed the triage vital signs and the nursing notes.  Pertinent labs & imaging results that were available during my care of the patient were reviewed by me and considered in my medical decision making (see chart for details).  Clinical Course as of Nov 30 1350  Wed Nov 29, 2017  1322 84 yo M with h/o adrenal insufficiency, recently diagnosed osteo here with sepsis, likely 2/2 his RLE. Likely 2/2 his osteo failing outpt abx, though redness seems to be more focused about the ankle. No other apparent sources. Given marked leukocytosis, borderline hypotension in this immune suppressed patient, concern for sepsis and will start empiric ABX - BCx, UCx sent. ID consulted, will see pt. Admit to medicine.   [CI]  1351 Dr. Linus Salmons to see   [CI]  1351 Labs show marked increase in leukocytosis with left shift, c/w sepsis. ABX given, fluids given. LA normal which is reassuring. CMP with baseline CKD. CXR without PNA. Suspect sepsis 2/2 osteo and cellulitis. Admit to medicine.   [CI]    Clinical Course User Index [CI] Duffy Bruce, MD    Final Clinical Impressions(s) / ED Diagnoses   Final diagnoses:  Sepsis due to cellulitis [facility])  Acute hematogenous osteomyelitis of right foot [facility])    ED Discharge Orders    None       Duffy Bruce, MD 11/29/17 1352

## 2017-11-29 NOTE — ED Triage Notes (Signed)
A nurse from [facility] phones to tell us they are in the process of phoning EMS d/t pt. Being febrile. She also tells me that pt. Is currently being treated for a wound at right foot at our [facility]. Pt. Is currently on Doxy. And Cefdinir; and was seen most recently at the [facility] by Dr. Dellia Nims. Dr. Dellia Nims has recommended I.D. Consultation, which has not occurred as of yet.

## 2017-11-29 NOTE — ED Notes (Signed)
ED TO INPATIENT HANDOFF REPORT  Name/Age/Gender [name] [name]. 84 y.o. male  Code Status Code Status History    Date Active Date Inactive Code Status Order ID Comments User Context   11/18/2017 0335 11/19/2017 1738 DNR [id number]  Etta Quill, DO ED   10/12/2016 1835 10/14/2016 1747 DNR [id number]  Murlean Iba, MD ED   10/12/2016 1758 10/12/2016 1835 DNR [id number]  Varney Biles, MD ED   11/27/2015 0825 12/01/2015 1858 DNR [id number]  Debbe Odea, MD ED   10/21/2015 0130 10/24/2015 1502 DNR [id number]  Reubin Milan, MD ED   10/20/2015 2333 10/21/2015 0130 DNR [id number]  Orlie Dakin, MD ED   07/07/2015 1047 10/20/2015 2333 DNR [id number]  Ripley Fraise, [place] [facility]   06/29/2015 1508 07/01/2015 1404 Full Code [id number]  Gaynelle Arabian, MD Inpatient   10/20/2013 0059 10/22/2013 1712 Full Code [id number]  Sheila Oats, MD Inpatient    Questions for Most Recent Historical Code Status (Order [id number])    Question Answer Comment   In the event of cardiac or respiratory ARREST Do not call a "code blue"    In the event of cardiac or respiratory ARREST Do not perform Intubation, CPR, defibrillation or ACLS    In the event of cardiac or respiratory ARREST Use medication by any route, position, wound care, and other measures to relive pain and suffering. May use oxygen, suction and manual treatment of airway obstruction as needed for comfort.    Comments Confirmed directly with patient         Advance Directive Documentation     Most Recent Value  Type of Advance Directive  Out of facility DNR (pink MOST or yellow form), Healthcare Power of Attorney  Pre-existing out of facility DNR order (yellow form or pink MOST form)  Yellow form placed in chart (order not valid for inpatient use)  "MOST" Form in Place?  -      Home/SNF/Other Nursing Home  Chief Complaint Infection; Fever  Level of Care/Admitting Diagnosis ED Disposition    ED Disposition Condition [facility]: [facility] [[id number]]  Level of Care: Med-Surg [16]  Diagnosis: Osteomyelitis [facility]) [[id number]]  Admitting Physician: Janece Canterbury [phone]  Attending Physician: Janece Canterbury [phone]  Estimated length of stay: 3 - 4 days  Certification:: I certify this patient will need inpatient services for at least 2 midnights  PT Class (Do Not Modify): Inpatient [101]  PT Acc Code (Do Not Modify): Private [1]       Medical History Past Medical History:  Diagnosis Date  . Allergic rhinitis    Prior allergy shots 20 years  . Anemia   . Anemia, B12 deficiency 2000  . Arthritis    Status post left total replacement 8 2011  . BPH (benign prostatic hyperplasia) 2013  . Cancer ([place])    renal cell ca and skin cancer   . Colitis 2014  . Difficult intubation 1994   surgery had to be  stopped due to injury to "throat"  . Difficult intubation 1994   no trouble since.  Required nasotracheal intubation  '02 [facility] / ANESTHESIA RECORD FROM 2013 AND 2016 IN EPIC  . Glaucoma 2007   Status post left trabeculectomy 2007  . History of colon polyps    Colonoscopy 2001  . History of kidney stones   . History of renal cell carcinoma 1994   Status post right nephrectomy  . Hyperlipidemia 2003  . Hypertension   .  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 Hypothyroidism (acquired) 2000  . Nocturia   . Pituitary mass ([place]) 2000   S/p transphenoidal excision 05/1999 ([facility])  . Vitamin D deficiency 2009    Allergies Allergies  Allergen Reactions  . Adhesive [Tape]     CAUSES SKIN TEARS   . Percocet [Oxycodone-Acetaminophen] Other (See Comments)    Just doesn't like it  . Robaxin [Methocarbamol]   . Diovan [Valsartan] Rash    IV Location/Drains/Wounds Patient Lines/Drains/Airways Status   Active Line/Drains/Airways    Name:   Placement date:   Placement time:   Site:   Days:   Peripheral IV 11/29/17 Left  Antecubital   11/29/17    1141    Antecubital   less than 1   Peripheral IV 11/29/17 Left;Upper Arm   11/29/17    1244    Arm   less than 1   Wound / Incision (Open or Dehisced) 11/18/17 Other (Comment) Foot Right;Posterior;Other (Comment) Full thickness wound   11/18/17    0510    Foot   11          Labs/Imaging Results for orders placed or performed during the hospital encounter of 11/29/17 (from the past 48 hour(s))  Comprehensive metabolic panel     Status: Abnormal   Collection Time: 11/29/17 12:10 PM  Result Value Ref Range   Sodium 134 (L) 135 - 145 mmol/L   Potassium 3.7 3.5 - 5.1 mmol/L   Chloride 100 (L) 101 - 111 mmol/L   CO2 26 22 - 32 mmol/L   Glucose, Bld 88 65 - 99 mg/dL   BUN 20 6 - 20 mg/dL   Creatinine, Ser 1.31 (H) 0.61 - 1.24 mg/dL   Calcium 8.4 (L) 8.9 - 10.3 mg/dL   Total Protein 5.5 (L) 6.5 - 8.1 g/dL   Albumin 2.7 (L) 3.5 - 5.0 g/dL   AST 15 15 - 41 U/L   ALT 13 (L) 17 - 63 U/L   Alkaline Phosphatase 51 38 - 126 U/L   Total Bilirubin 1.3 (H) 0.3 - 1.2 mg/dL   GFR calc non Af Amer 46 (L) >60 mL/min   GFR calc Af Amer 53 (L) >60 mL/min    Comment: (NOTE) The eGFR has been calculated using the CKD EPI equation. This calculation has not been validated in all clinical situations. eGFR's persistently <60 mL/min signify possible Chronic Kidney Disease.    Anion gap 8 5 - 15    Comment: Performed at [facility], [place] [address], [place], [place] [address]  CBC with Differential     Status: Abnormal   Collection Time: 11/29/17 12:10 PM  Result Value Ref Range   WBC 30.5 (H) 4.0 - 10.5 K/uL   RBC 3.32 (L) 4.22 - 5.81 MIL/uL   Hemoglobin 10.9 (L) 13.0 - 17.0 g/dL   HCT 32.8 (L) 39.0 - 52.0 %   MCV 98.8 78.0 - 100.0 fL   MCH 32.8 26.0 - 34.0 pg   MCHC 33.2 30.0 - 36.0 g/dL   RDW 15.9 (H) 11.5 - 15.5 %   Platelets 227 150 - 400 K/uL   Neutrophils Relative % 87 %   Lymphocytes Relative 10 %   Monocytes Relative 3 %   Eosinophils  Relative 0 %   Basophils Relative 0 %   Neutro Abs 26.5 (H) 1.7 - 7.7 K/uL   Lymphs Abs 3.1 0.7 - 4.0 K/uL  Monocytes Absolute 0.9 0.1 - 1.0 K/uL   Eosinophils Absolute 0.0 0.0 - 0.7 K/uL   Basophils Absolute 0.0 0.0 - 0.1 K/uL   WBC Morphology MILD LEFT SHIFT (1-5% METAS, OCC MYELO, OCC BANDS)     Comment: Performed at [facility], [place] [address], [place], [place] [address]  Protime-INR     Status: Abnormal   Collection Time: 11/29/17 12:10 PM  Result Value Ref Range   Prothrombin Time 15.7 (H) 11.4 - 15.2 seconds   INR 1.26     Comment: Performed at [facility], [place] [address], [place], [place] [address]  Urinalysis, Routine w reflex microscopic     Status: None   Collection Time: 11/29/17 12:10 PM  Result Value Ref Range   Color, Urine YELLOW YELLOW   APPearance CLEAR CLEAR   Specific Gravity, Urine 1.010 1.005 - 1.030   pH 7.0 5.0 - 8.0   Glucose, UA NEGATIVE NEGATIVE mg/dL   Hgb urine dipstick NEGATIVE NEGATIVE   Bilirubin Urine NEGATIVE NEGATIVE   Ketones, ur NEGATIVE NEGATIVE mg/dL   Protein, ur NEGATIVE NEGATIVE mg/dL   Nitrite NEGATIVE NEGATIVE   Leukocytes, UA NEGATIVE NEGATIVE    Comment: Performed at [place] [address], [place],  [address]  Sedimentation rate     Status: Abnormal   Collection Time: 11/29/17 12:10 PM  Result Value Ref Range   Sed Rate 35 (H) 0 - 16 mm/hr    Comment: Performed at [facility], [place] [address], [place],  [address]  I-Stat CG4 Lactic Acid, ED     Status: None   Collection Time: 11/29/17 12:25 PM  Result Value Ref Range   Lactic Acid, Venous 0.83 0.5 - 1.9 mmol/L  I-Stat CG4 Lactic Acid, ED  (not at  [facility])     Status: None   Collection Time: 11/29/17  1:58 PM  Result Value Ref Range   Lactic Acid, Venous 0.83 0.5 - 1.9 mmol/L   Dg Chest Port 1 View  Result Date: 11/29/2017 CLINICAL DATA:  Code sepsis. Inflammatory changes of the  right lower extremity. Fever. EXAM: PORTABLE CHEST 1 VIEW COMPARISON:  Chest x-ray of November 18, 2017 FINDINGS: The lungs are well-expanded. Small amounts of pleural fluid are likely present at both lung bases. There is no alveolar infiltrate. The cardiac silhouette is enlarged. The pulmonary vascularity is normal. There is calcification in the wall of the aortic arch. The trachea is midline. The bony thorax exhibits no acute abnormality. IMPRESSION: Probable small bilateral pleural effusions. No alveolar pneumonia nor CHF. Mild cardiomegaly. Thoracic aortic atherosclerosis. Electronically Signed   By: [name]  Martinique M.D.   On: 11/29/2017 13:00    Pending Labs Unresulted Labs (From admission, onward)   Start     Ordered   11/29/17 1150  C-reactive protein  Once,   STAT     11/29/17 1149   11/29/17 1149  Blood Culture (routine x 2)  BLOOD CULTURE X 2,   STAT    Question:  Patient immune status  Answer:  Normal   11/29/17 1149   11/29/17 1149  Urine culture  STAT,   STAT    Question:  Patient immune status  Answer:  Normal   11/29/17 1149      Vitals/Pain Today's Vitals   11/29/17 1430 11/29/17 1442 11/29/17 1500 11/29/17 1501  BP: (!) 110/53 (!) 110/53 117/77   Pulse: 64 72 75   Resp: 16 19 (!) 24  17  Temp:      TempSrc:      SpO2: 91% 90% 94%   Weight:      Height:      PainSc:        Isolation Precautions No active isolations  Medications Medications  piperacillin-tazobactam (ZOSYN) IVPB 3.375 g (0 g Intravenous Stopped 11/29/17 1349)  vancomycin (VANCOCIN) IVPB 1000 mg/200 mL premix (0 mg Intravenous Stopped 11/29/17 1349)  hydrocortisone sodium succinate (SOLU-CORTEF) 100 MG injection 100 mg (100 mg Intravenous Given 11/29/17 1241)  sodium chloride 0.9 % bolus 1,000 mL (0 mLs Intravenous Stopped 11/29/17 1349)  sodium chloride 0.9 % bolus 1,000 mL (0 mLs Intravenous Stopped 11/29/17 1349)    Mobility walks

## 2017-11-29 NOTE — ED Notes (Signed)
Bed: WA08 Expected date: 11/29/17 Expected time:  Means of arrival:  Comments: EMS/febrile/foot wound

## 2017-11-29 NOTE — Progress Notes (Signed)
A consult was received from an ED physician for vanc/Zosyn per pharmacy dosing.  The patient's profile has been reviewed for ht/wt/allergies/indication/available labs.   A one time order has been placed for vanc 1g and zosyn 3.375g.  Further antibiotics/pharmacy consults should be ordered by admitting physician if indicated.                       Thank you, Kara Mead 11/29/2017  11:50 AM

## 2017-11-29 NOTE — Consult Note (Signed)
Reason for Consult:  Right foot ulcer Referring Physician:   Dr. Janece Canterbury  [name] [name]. is an 84 y.o. male.  HPI: Patient is a very pleasant 84 year old gentleman who is a patient at [facility].  He was admitted today due to cellulitis of his right leg.  He actually has an MRI that was done of his right foot on 11/24/2017.  According to the patient has been followed in the wound center as well.  He denies any foot pain at all but the MRI was concerning for tendinosis and tendinitis with tenosynovitis of the flexor tendons of the first ray as well as some destruction of the lateral sesamoid.  Orthopedic surgery is consulted to assess the possibility of surgery but also to assess weightbearing status.  Again the patient reports no foot pain.  He is on IV antibiotics and is certainly interested in nonoperative treatment of possible.  Past Medical History:  Diagnosis Date  . Allergic rhinitis    Prior allergy shots 20 years  . Anemia   . Anemia, B12 deficiency 2000  . Arthritis    Status post left total replacement 8 2011  . BPH (benign prostatic hyperplasia) 2013  . Cancer ([place])    renal cell ca and skin cancer   . Colitis 2014  . Difficult intubation 1994   surgery had to be  stopped due to injury to "throat"  . Difficult intubation 1994   no trouble since.  Required nasotracheal intubation  '02 [facility] / ANESTHESIA RECORD FROM 2013 AND 2016 IN EPIC  . Glaucoma 2007   Status post left trabeculectomy 2007  . History of colon polyps    Colonoscopy 2001  . History of kidney stones   . History of renal cell carcinoma 1994   Status post right nephrectomy  . Hyperlipidemia 2003  . Hypertension   . 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 Hypothyroidism (acquired) 2000  . Nocturia   . Pituitary mass ([place]) 2000   S/p transphenoidal excision 05/1999 ([facility])  . Vitamin D deficiency 2009    Past Surgical  History:  Procedure Laterality Date  . BRAIN SURGERY  2000   pituatary gland removed .  Marland Kitchen CHOLECYSTECTOMY  1984  . Ectropion surgery Bilateral 2006  . EYE SURGERY  over last 6 yrs.     trabeculectomy...   . EYE SURGERY   cat ext ou  . JOINT REPLACEMENT  2011   knee left  . KNEE ARTHROSCOPY Right 02/06/2015   Procedure: RIGHT ARTHROSCOPY KNEE WITH LATERAL MENSICAL  DEBRIDEMENT;  Surgeon: Gaynelle Arabian, MD;  Location: [facility];  Service: Orthopedics;  Laterality: Right;  . Mohs procedure      for skin cancer on nose   . NEPHRECTOMY Right 1994   Renal cell  . REVERSE SHOULDER ARTHROPLASTY  12/15/2011   Procedure: REVERSE SHOULDER ARTHROPLASTY;  Surgeon: Marin Shutter, MD;  Location: [facility];  Service: Orthopedics;  Laterality: Right;  right total reverse shoulder  . TONSILLECTOMY    . TOTAL KNEE ARTHROPLASTY Right 06/29/2015   Procedure: TOTAL KNEE ARTHROPLASTY;  Surgeon: Gaynelle Arabian, MD;  Location: [facility];  Service: Orthopedics;  Laterality: Right;  . Transsphenoidal excision pituitary tumor  05/1999   A.Tommi Rumps, M.D.([facility])    Family History  Problem Relation Age of Onset  . Lung cancer Father   . Anesthesia problems Neg Hx   . Hypotension Neg Hx   . Malignant hyperthermia  Neg Hx   . Pseudochol deficiency Neg Hx     Social History:  reports that he quit smoking about 29 years ago. His smoking use included pipe. He has never used smokeless tobacco. He reports that he drinks alcohol. He reports that he does not use drugs.  Allergies:  Allergies  Allergen Reactions  . Adhesive [Tape]     CAUSES SKIN TEARS   . Percocet [Oxycodone-Acetaminophen] Other (See Comments)    Just doesn't like it  . Robaxin [Methocarbamol]   . Diovan [Valsartan] Rash    Medications: I have reviewed the patient's current medications.  Results for orders placed or performed during the hospital encounter of 11/29/17 (from the past 48 hour(s))  Comprehensive metabolic panel     Status: Abnormal    Collection Time: 11/29/17 12:10 PM  Result Value Ref Range   Sodium 134 (L) 135 - 145 mmol/L   Potassium 3.7 3.5 - 5.1 mmol/L   Chloride 100 (L) 101 - 111 mmol/L   CO2 26 22 - 32 mmol/L   Glucose, Bld 88 65 - 99 mg/dL   BUN 20 6 - 20 mg/dL   Creatinine, Ser 1.31 (H) 0.61 - 1.24 mg/dL   Calcium 8.4 (L) 8.9 - 10.3 mg/dL   Total Protein 5.5 (L) 6.5 - 8.1 g/dL   Albumin 2.7 (L) 3.5 - 5.0 g/dL   AST 15 15 - 41 U/L   ALT 13 (L) 17 - 63 U/L   Alkaline Phosphatase 51 38 - 126 U/L   Total Bilirubin 1.3 (H) 0.3 - 1.2 mg/dL   GFR calc non Af Amer 46 (L) >60 mL/min   GFR calc Af Amer 53 (L) >60 mL/min    Comment: (NOTE) The eGFR has been calculated using the CKD EPI equation. This calculation has not been validated in all clinical situations. eGFR's persistently <60 mL/min signify possible Chronic Kidney Disease.    Anion gap 8 5 - 15    Comment: Performed at [facility], [place] [address] [place], [place] [address]  CBC with Differential     Status: Abnormal   Collection Time: 11/29/17 12:10 PM  Result Value Ref Range   WBC 30.5 (H) 4.0 - 10.5 K/uL   RBC 3.32 (L) 4.22 - 5.81 MIL/uL   Hemoglobin 10.9 (L) 13.0 - 17.0 g/dL   HCT 32.8 (L) 39.0 - 52.0 %   MCV 98.8 78.0 - 100.0 fL   MCH 32.8 26.0 - 34.0 pg   MCHC 33.2 30.0 - 36.0 g/dL   RDW 15.9 (H) 11.5 - 15.5 %   Platelets 227 150 - 400 K/uL   Neutrophils Relative % 87 %   Lymphocytes Relative 10 %   Monocytes Relative 3 %   Eosinophils Relative 0 %   Basophils Relative 0 %   Neutro Abs 26.5 (H) 1.7 - 7.7 K/uL   Lymphs Abs 3.1 0.7 - 4.0 K/uL   Monocytes Absolute 0.9 0.1 - 1.0 K/uL   Eosinophils Absolute 0.0 0.0 - 0.7 K/uL   Basophils Absolute 0.0 0.0 - 0.1 K/uL   WBC Morphology MILD LEFT SHIFT (1-5% METAS, OCC MYELO, OCC BANDS)     Comment: Performed at [facility], [place] [address], [place], [place] [address]  Protime-INR     Status: Abnormal   Collection Time: 11/29/17 12:10 PM  Result Value  Ref Range   Prothrombin Time 15.7 (H) 11.4 - 15.2 seconds   INR 1.26     Comment: Performed at  [facility], [place] [address], [place], [place] [address]  Urinalysis, Routine w reflex microscopic     Status: None   Collection Time: 11/29/17 12:10 PM  Result Value Ref Range   Color, Urine YELLOW YELLOW   APPearance CLEAR CLEAR   Specific Gravity, Urine 1.010 1.005 - 1.030   pH 7.0 5.0 - 8.0   Glucose, UA NEGATIVE NEGATIVE mg/dL   Hgb urine dipstick NEGATIVE NEGATIVE   Bilirubin Urine NEGATIVE NEGATIVE   Ketones, ur NEGATIVE NEGATIVE mg/dL   Protein, ur NEGATIVE NEGATIVE mg/dL   Nitrite NEGATIVE NEGATIVE   Leukocytes, UA NEGATIVE NEGATIVE    Comment: Performed at [place] [address], [place], [place] [address]  Sedimentation rate     Status: Abnormal   Collection Time: 11/29/17 12:10 PM  Result Value Ref Range   Sed Rate 35 (H) 0 - 16 mm/hr    Comment: Performed at [facility], [place] [address], [place], [place] [address]  I-Stat CG4 Lactic Acid, ED     Status: None   Collection Time: 11/29/17 12:25 PM  Result Value Ref Range   Lactic Acid, Venous 0.83 0.5 - 1.9 mmol/L  I-Stat CG4 Lactic Acid, ED  (not at  [facility])     Status: None   Collection Time: 11/29/17  1:58 PM  Result Value Ref Range   Lactic Acid, Venous 0.83 0.5 - 1.9 mmol/L    Dg Chest Port 1 View  Result Date: 11/29/2017 CLINICAL DATA:  Code sepsis. Inflammatory changes of the right lower extremity. Fever. EXAM: PORTABLE CHEST 1 VIEW COMPARISON:  Chest x-ray of November 18, 2017 FINDINGS: The lungs are well-expanded. Small amounts of pleural fluid are likely present at both lung bases. There is no alveolar infiltrate. The cardiac silhouette is enlarged. The pulmonary vascularity is normal. There is calcification in the wall of the aortic arch. The trachea is midline. The bony thorax exhibits no acute abnormality. IMPRESSION: Probable small bilateral pleural  effusions. No alveolar pneumonia nor CHF. Mild cardiomegaly. Thoracic aortic atherosclerosis. Electronically Signed   By: [name]  Martinique M.D.   On: 11/29/2017 13:00    ROS Blood pressure 117/77, pulse 75, temperature 98.5 F (36.9 C), temperature source Oral, resp. rate 17, height '5\' 11"'  (1.803 m), weight 165 lb (74.8 kg), SpO2 94 %. Physical Exam  Constitutional: He is oriented to person, place, and time. He appears well-developed and well-nourished.  Musculoskeletal:       Legs:      Feet:  Neurological: He is alert and oriented to person, place, and time.  Skin: Skin is warm and dry.  Psychiatric: He has a normal mood and affect.    Assessment/Plan: Right foot chronic ulcer with involvement of the lateral sesamoid and significant tenosynovitis of the posterior tendon of the first ray; right leg cellulitis.  Fortunately this is not appeared to need an urgent surgical intervention.  It is not involved the bone per se other than the lateral sesamoid.  From a surgical standpoint certainly an irrigation debridement with removal of the sesamoid and the tendon in this area would be helpful.  This is likely chronic and can be addressed as an outpatient if warranted.  Certainly IV antibiotics are needed to address his right lower extremity cellulitis.  I have no weightbearing restrictions for him on this foot, but a postoperative shoe will be helpful.  I will continue to follow him during his hospitalization and talked to him in length about  treatment options and the plan.  Mcarthur Rossetti 11/29/2017, 5:53 PM

## 2017-11-30 ENCOUNTER — Inpatient Hospital Stay (HOSPITAL_COMMUNITY): Payer: PPO

## 2017-11-30 DIAGNOSIS — E230 Hypopituitarism: Secondary | ICD-10-CM

## 2017-11-30 DIAGNOSIS — Z885 Allergy status to narcotic agent status: Secondary | ICD-10-CM

## 2017-11-30 DIAGNOSIS — Z87891 Personal history of nicotine dependence: Secondary | ICD-10-CM

## 2017-11-30 DIAGNOSIS — L03115 Cellulitis of right lower limb: Secondary | ICD-10-CM

## 2017-11-30 DIAGNOSIS — Z905 Acquired absence of kidney: Secondary | ICD-10-CM

## 2017-11-30 DIAGNOSIS — M869 Osteomyelitis, unspecified: Secondary | ICD-10-CM

## 2017-11-30 DIAGNOSIS — L0390 Cellulitis, unspecified: Secondary | ICD-10-CM

## 2017-11-30 DIAGNOSIS — Z888 Allergy status to other drugs, medicaments and biological substances status: Secondary | ICD-10-CM

## 2017-11-30 DIAGNOSIS — Z91048 Other nonmedicinal substance allergy status: Secondary | ICD-10-CM

## 2017-11-30 DIAGNOSIS — N183 Chronic kidney disease, stage 3 (moderate): Secondary | ICD-10-CM

## 2017-11-30 DIAGNOSIS — Z85520 Personal history of malignant carcinoid tumor of kidney: Secondary | ICD-10-CM

## 2017-11-30 DIAGNOSIS — M86671 Other chronic osteomyelitis, right ankle and foot: Secondary | ICD-10-CM

## 2017-11-30 DIAGNOSIS — G629 Polyneuropathy, unspecified: Secondary | ICD-10-CM

## 2017-11-30 LAB — BASIC METABOLIC PANEL
ANION GAP: 7 (ref 5–15)
BUN: 20 mg/dL (ref 6–20)
CALCIUM: 8.5 mg/dL — AB (ref 8.9–10.3)
CO2: 27 mmol/L (ref 22–32)
Chloride: 106 mmol/L (ref 101–111)
Creatinine, Ser: 1.27 mg/dL — ABNORMAL HIGH (ref 0.61–1.24)
GFR calc Af Amer: 55 mL/min — ABNORMAL LOW (ref >60)
GFR, EST NON AFRICAN AMERICAN: 47 mL/min — AB (ref >60)
GLUCOSE: 128 mg/dL — AB (ref 65–99)
Potassium: 3.7 mmol/L (ref 3.5–5.1)
Sodium: 140 mmol/L (ref 135–145)

## 2017-11-30 LAB — CBC
HCT: 30.9 % — ABNORMAL LOW (ref 39.0–52.0)
Hemoglobin: 10.1 g/dL — ABNORMAL LOW (ref 13.0–17.0)
MCH: 32.6 pg (ref 26.0–34.0)
MCHC: 32.7 g/dL (ref 30.0–36.0)
MCV: 99.7 fL (ref 78.0–100.0)
Platelets: 211 10*3/uL (ref 150–400)
RBC: 3.1 MIL/uL — ABNORMAL LOW (ref 4.22–5.81)
RDW: 16.1 % — AB (ref 11.5–15.5)
WBC: 17.1 10*3/uL — ABNORMAL HIGH (ref 4.0–10.5)

## 2017-11-30 LAB — URINE CULTURE
Culture: NO GROWTH
SPECIAL REQUESTS: NORMAL

## 2017-11-30 MED ORDER — CEPHALEXIN 500 MG PO CAPS
500.0000 mg | ORAL_CAPSULE | Freq: Four times a day (QID) | ORAL | Status: DC
  Administered 2017-12-01 (×2): 500 mg via ORAL
  Filled 2017-11-30 (×2): qty 1

## 2017-11-30 NOTE — Progress Notes (Addendum)
RLE venous duplex prelim: negative for DVT.  ABI prelim: Right 1.16. Left 1.17.  Landry Mellow, RDMS, RVT

## 2017-11-30 NOTE — Consult Note (Addendum)
   [facility] CM Inpatient Consult   11/30/2017  [name] [name]. 01-Aug-1923 [id number]    Spoke with [name] [name] at bedside to offer [facility] Management program services.   [name] [name] pleasantly declined [facility] Management follow up. States " I have everything I need at [facility]".   Left [facility] Care Management brochure with contact information along with 24-hr nurse advice line magnet to call if needed.  Made inpatient RNCM aware [name] [name] declined [facility] Management services.   Marthenia Rolling, MSN-Ed, RN,BSN [facility] Liaison [phone]

## 2017-11-30 NOTE — Progress Notes (Signed)
Pt lives at [facility] Admitted for sepsis/cellulitis. Pt went to SNF section of [facility] at DC from [facility] recently (at that time treated for ulcer of R foot). Today pt alert/oriented, states he returned home to his independent living apartment where he lives with his significant other. States, "before this I was doing fine, walking with a cane sometimes, out playing golf, very independent." Provided permission for CSW to keep in contact with [facility] once DC needs are known.  See below for complete assessment 11/19/17. Notable changes in psychosocial situation discussed above.   Sharren Bridge, MSW, LCSW Clinical Social Work 11/30/2017 [phone]     Clinical Social Work Assessment  Patient Details  Name: [name] [name]. MRN: [id number] Date of Birth: 06-04-1923  Date of referral:  11/19/17               Reason for consult:  Facility Placement                 Permission sought to share information with:  Facility Sport and exercise psychologist, Family Supports Permission granted to share information::  Yes, Verbal Permission Granted             Name::     Immunologist::  [facility]             Relationship::  Significant other             Contact Information:     Housing/Transportation Living arrangements for the past 2 months:  Charity fundraiser of Information:  Patient Patient Interpreter Needed:  None Criminal Activity/Legal Involvement Pertinent to Current Situation/Hospitalization:  No - Comment as needed Significant Relationships:  Adult Children, Significant Other Lives with:  Self, Facility Resident Do you feel safe going back to the place where you live?  Yes Need for family participation in patient care:  No (Coment)  Care giving concerns:  Patient lives at [facility] but will be going to SNF for a short period of time upon return there.    Social Worker assessment / plan:  CSW alerted by [facility] that  patient will be going to [facility] upon return. CSW completed paperwork and sent to [facility], Dian Situ. CSW met with patient and patient's significant other at bedside to discuss return to [place]. CSW confirmed plan with patient, and that patient's family will be providing transport back to [facility].  Employment status:  Retired Nurse, adult PT Recommendations:  Not assessed at this time Information / Referral to community resources:     Patient/Family's Response to care:  Patient and patient's family agreeable to return to [place] and to go to SNF for a short period of time.  Patient/Family's Understanding of and Emotional Response to Diagnosis, Current Treatment, and Prognosis:  Patient discussed how he had already arranged to go to SNF at [facility], and knows where he will be going and has transport with family. Patient provided information on who to contact over at [facility], and that he was appreciative of CSW assistance in coordinating return.  Emotional Assessment Appearance:  Appears stated age Attitude/Demeanor/Rapport:  Engaged Affect (typically observed):  Pleasant Orientation:  Oriented to Self, Oriented to Place, Oriented to  Time, Oriented to Situation Alcohol / Substance use:  Not Applicable Psych involvement (Current and /or in the community):  No (Comment)  Discharge Needs  Concerns to be addressed:  Care Coordination Readmission within the last  30 days:  No Current discharge risk:  Dependent with Mobility Barriers to Discharge:  No Barriers Identified   Geralynn Ochs, LCSW 11/19/2017, 2:02 PM

## 2017-11-30 NOTE — Consult Note (Signed)
[facility] Nurse wound consult note Reason for Consult: right shin and right foot wounds Wound type: right shin appears to be an abrasion.  Right dorsal foot etiology is unclear. Right shin area is partial thickness, no drainage, no odor, and measures 2cm x 0.5 cm.  Continue with a foam dressing to this area and change every 3 days. The dorsum of the right foot at the great toe etiology is unclear to me.  The patient stated it was present and he did not know it, but that his podiatrist found it and treated it for several weeks, then recommended he go to a wound clinic.  The patient states he has been told that he has osteomyelitis to the bone in this area and surgery has been recommended.  He has been being treated at a wound clinic.  Today the area is calloused, no drainage, no odor, no pain on assessment.  It measures 1.6 cm x 1 c x 1.2 cm.  I have explained that no bandage I can put on this wound will make it heal.  The best approach for care during hospitalization is dry gauze and protect the area while he weighs his options and gets additional guidance from the orthopedic surgeon.. Thank you for the consult.  Discussed plan of care with the patient and bedside nurse.  [facility] nurse will not follow at this time.  Please re-consult the [facility] team if needed.  Val Riles, RN, MSN, CWOCN, CNS-BC, pager [phone]

## 2017-11-30 NOTE — Progress Notes (Signed)
Patient Demographics:    [name] [name], is a 84 y.o. male, DOB - 10-Oct-1922, GYK:[id number]  Admit date - 11/29/2017   Admitting Physician Janece Canterbury, MD  Outpatient Primary MD for the patient is [name], DO  LOS - 1   Chief Complaint  Patient presents with  . 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  Continue Vanco and Zosyn pending infectious disease consult ,  White count is down to 17,000 from 30,000, suspect mostly steroid-induced leukocytosis  2)RT FOOT infected Ulcer of R foot  cellulitis -resolved mostly, patient has a dry ulcer crater around the first metatarsal area on the sole of the right foot , patient sees Dr. Dellia Nims and wound care center, MRI of the right foot shows possible osteomyelitis of the sesamoid bone and tenosynovitis, Dr. Ninfa Linden from orthopedic surgery has been consulted.  Patient has pan hypopituitarism so typically with admissions he gets high-dose steroids/stress dose steroids this leads to persistent leukocytosis which may confuse the clinical picture with regards to infection.  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 Linus Salmons, infectious disease  And Dr. Ninfa Linden, Orthopedic surgery   DVT Prophylaxis  :  Lovenox   Lab Results  Component Value Date   PLT 211 11/30/2017    Inpatient Medications  Scheduled Meds: . [START ON 12/01/2017] cephALEXin  500 mg Oral Q6H  . docusate sodium  100 mg Oral BID  . enoxaparin (LOVENOX) injection  40 mg Subcutaneous Q24H  . finasteride  5 mg Oral q morning - 10a  . lactobacillus acidophilus & bulgar  1 tablet Oral TID WC  . levothyroxine  88 mcg Oral QAC breakfast  . nystatin  5 mL Oral QID  . omega-3 acid ethyl esters  1 g Oral Daily  . pantoprazole  40 mg Oral Daily  . pravastatin  80 mg Oral QPM  . predniSONE  15 mg Oral Q breakfast  . predniSONE  7.5 mg Oral QPM  . [START ON  12/01/2017] testosterone cypionate  30 mg Intramuscular Q7 days  . vitamin B-12  1,000 mcg Oral Daily   Continuous Infusions: PRN Meds:.acetaminophen, ondansetron **OR** ondansetron (ZOFRAN) IV    Anti-infectives (From admission, onward)   Start     Dose/Rate Route Frequency Ordered Stop   12/01/17 0600  cephALEXin (KEFLEX) capsule 500 mg     500 mg Oral Every 6 hours 11/30/17 1421     11/29/17 2200  vancomycin (VANCOCIN) IVPB 750 mg/150 ml premix  Status:  Discontinued     750 mg 150 mL/hr over 60 Minutes Intravenous Every 24 hours 11/29/17 1626 11/30/17 1403   11/29/17 2000  piperacillin-tazobactam (ZOSYN) IVPB 3.375 g  Status:  Discontinued     3.375 g 12.5  mL/hr over 240 Minutes Intravenous Every 8 hours 11/29/17 1626 11/30/17 1403   11/29/17 1200  piperacillin-tazobactam (ZOSYN) IVPB 3.375 g     3.375 g 100 mL/hr over 30 Minutes Intravenous  Once 11/29/17 1149 11/29/17 1349   11/29/17 1200  vancomycin (VANCOCIN) IVPB 1000 mg/200 mL premix     1,000 mg 200 mL/hr over 60 Minutes Intravenous  Once 11/29/17 1149 11/29/17 1349        Objective:   Vitals:   11/29/17 1910 11/29/17 2045 11/30/17 0437 11/30/17 0900  BP: (!) 121/57 (!) 110/52 124/71 136/65  Pulse: 63 69 69 70  Resp: 18 18 17    Temp: 98.1 F (36.7 C) 98.6 F (37 C) 97.6 F (36.4 C) 98.2 F (36.8 C)  TempSrc: Oral Oral Oral Oral  SpO2: 96% 97% 99% 100%  Weight: 81.9 kg (180 lb 8.9 oz)     Height: 5\' 11"  (1.803 m)       Wt Readings from Last 3 Encounters:  11/29/17 81.9 kg (180 lb 8.9 oz)  11/21/17 77.1 kg (170 lb)  11/18/17 77.1 kg (170 lb)     Intake/Output Summary (Last 24 hours) at 11/30/2017 1958 Last data filed at 11/30/2017 1000 Gross per 24 hour  Intake 400 ml  Output 1400 ml  Net -1000 ml     Physical Exam  Gen:- Awake Alert,  In   HEENT:- [place].AT, No sclera icterus Neck-Supple Neck,No JVD,.  Lungs-  CTAB , CV- S1, S2 normal Abd-  +ve B.Sounds, Abd Soft, No tenderness,    Extremity/Skin:-Significant erythema swelling warmth and tenderness over the right shin right leg area with an open wound over the upper part of the right shin without significant purulent drainage , RLE ankle/foot  without erythema, warmth or swelling. He does have a dry ulcer crater along the first metatarsal area  (Plantar surface) just behind the great toe (first metatarsal area).  The foot and the ankle does not look infected, psych-affect is appropriate, oriented x3 Neuro-no new focal deficits, no tremors      Data Review:   Micro Results Recent Results (from the past 240 hour(s))  Blood Culture (routine x 2)     Status: None (Preliminary result)   Collection Time: 11/29/17  12:10 PM  Result Value Ref Range Status   Specimen Description   Final    BLOOD RIGHT ANTECUBITAL Performed at [facility] [address] [place], [place] [address]    Special Requests   Final    BOTTLES DRAWN AEROBIC AND ANAEROBIC Blood Culture results may not be optimal due to an excessive volume of blood received in culture bottles Performed at [facility] [address] [place],  [address]    Culture  Final    NO GROWTH < 24 HOURS Performed at [facility], [place] [address], [place], [place] [address]    Report Status PENDING  Incomplete  Blood Culture (routine x 2)     Status: None (Preliminary result)   Collection Time: 11/29/17 12:10 PM  Result Value Ref Range Status   Specimen Description   Final    BLOOD LEFT ANTECUBITAL Performed at [facility] [address], [place], [place] [address]    Special Requests   Final    BOTTLES DRAWN AEROBIC AND ANAEROBIC Blood Culture results may not be optimal due to an excessive volume of blood received in culture bottles Performed at [facility] [address] [place], [place] [address]    Culture   Final    NO GROWTH < 12 HOURS Performed at [facility] [address], [place], [place] [address]    Report Status PENDING  Incomplete  Urine culture     Status: None   Collection Time: 11/29/17 12:10 PM  Result Value Ref Range Status   Specimen Description   Final    URINE, CLEAN CATCH Performed at [facility], [place] [address], [place], [place] [address]    Special Requests   Final    Normal Performed at [facility], [place] [address] [place], [place] [address]    Culture   Final    NO GROWTH Performed at [facility], [place] [address], [place], [place] [address]    Report Status 11/30/2017 FINAL  Final    Radiology Reports Dg Chest 2 View  Result Date: 11/18/2017 CLINICAL DATA:   Initial evaluation for acute fever. EXAM: CHEST - 2 VIEW COMPARISON:  Prior radiograph from 10/13/2016. FINDINGS: Mild cardiomegaly, stable. Mediastinal silhouette within normal limits. Aortic atherosclerosis. Lungs are hypoinflated. Mild bibasilar atelectatic changes and/or scarring. No consolidative airspace disease. No pulmonary edema or significant pleural effusion. No pneumothorax. Right shoulder arthroplasty noted.  No acute osseous abnormality. IMPRESSION: 1. Shallow lung inflation with mild bibasilar atelectasis and/or scarring. 2. No other active cardiopulmonary disease. 3. Aortic atherosclerosis. Electronically Signed   By: Jeannine Boga M.D.   On: 11/18/2017 01:28   Mr Foot Right W Wo Contrast  Result Date: 11/24/2017 CLINICAL DATA:  Cellulitis of right lower extremity. Ulcer on right foot near toes pt has had for months. EXAM: MRI OF THE RIGHT FOREFOOT WITHOUT AND WITH CONTRAST TECHNIQUE: Multiplanar, multisequence MR imaging of the right foot was performed before and after the administration of intravenous contrast. CONTRAST:  71mL MULTIHANCE GADOBENATE DIMEGLUMINE 529 MG/ML IV SOLN COMPARISON:  None. FINDINGS: Bones/Joint/Cartilage Soft tissue ulcer along the plantar aspect of the first metatarsal head with a sinus tract extending to the lateral hallux sesamoid. Severe soft tissue edema and enhancement of the soft tissues overlying the first MTP joint most concerning for cellulitis. Cortical destruction of lateral hallux sesamoid with severe marrow edema and enhancement. Marrow edema of medial hallux sesamoid without definite cortical destruction or T1 signal abnormality. Severe joint space narrowing with full-thickness cartilage loss and subchondral cystic changes involving the first MTP joint consistent with severe osteoarthritis. No bone destruction or periosteal reaction. No fracture or dislocation. Normal alignment. No joint effusion. Ligaments Collateral ligaments are intact.  Lisfranc  ligament is intact. Muscles and Tendons Severe tendinosis of flexor digitorum longus with a high-grade partial-thickness tear at the level of the first MTP joint. Generalized muscle atrophy. Soft tissue No fluid  collection or hematoma.  No soft tissue mass. IMPRESSION: 1. Soft tissue ulcer along the plantar aspect of the first metatarsal head with a sinus tract extending to the lateral hallux sesamoid. Severe soft tissue edema and enhancement of the soft tissues overlying the first MTP joint most concerning for cellulitis. Cortical destruction of lateral hallux sesamoid with severe marrow edema and enhancement concerning for osteomyelitis. Marrow edema of medial hallux sesamoid without definite cortical destruction or T1 signal abnormality concerning for mild osteomyelitis versus reactive marrow edema secondary to adjacent inflammation. 2. Severe joint space narrowing with full-thickness cartilage loss and subchondral cystic changes involving the first MTP joint consistent with severe osteoarthritis which may related to prior septic arthritis. 3. Severe tendinosis of flexor digitorum longus with a high-grade partial-thickness tear at the level of the first MTP joint. Electronically Signed   By: Kathreen Devoid   On: 11/24/2017 14:55   Dg Chest Port 1 View  Result Date: 11/29/2017 CLINICAL DATA:  Code sepsis. Inflammatory changes of the right lower extremity. Fever. EXAM: PORTABLE CHEST 1 VIEW COMPARISON:  Chest x-ray of November 18, 2017 FINDINGS: The lungs are well-expanded. Small amounts of pleural fluid are likely present at both lung bases. There is no alveolar infiltrate. The cardiac silhouette is enlarged. The pulmonary vascularity is normal. There is calcification in the wall of the aortic arch. The trachea is midline. The bony thorax exhibits no acute abnormality. IMPRESSION: Probable small bilateral pleural effusions. No alveolar pneumonia nor CHF. Mild cardiomegaly. Thoracic aortic atherosclerosis.  Electronically Signed   By: [name]  Martinique M.D.   On: 11/29/2017 13:00   Dg Foot Complete Right  Result Date: 11/17/2017 CLINICAL DATA:  Leg cellulitis and foot wound. EXAM: RIGHT FOOT COMPLETE - 3+ VIEW COMPARISON:  None. FINDINGS: Apart from some interval increase in soft tissue swelling about the base of the great toe, no significant change in the osseous appearance of the foot with severe osteoarthritic joint space narrowing of the first MTP with subchondral degenerative cystic change. No bony sequestrum is noted. Advanced degenerative changes are present of the second third TMT articulations. No acute fracture or foreign body. No soft tissue gas. No soft tissue tophus. Shallow ulceration along the plantar aspect of the forefoot as before. IMPRESSION: Some progression of soft tissue swelling without frank bone destruction involving the forefoot and in particular the great toe. Redemonstration of severe osteoarthritis of the first MTP and mild to moderate degenerative change at the base of the second and third TMT joints. Electronically Signed   By: Ashley Royalty M.D.   On: 11/17/2017 23:22   Dg Foot Complete Right  Result Date: 11/07/2017 CLINICAL DATA:  Nonhealing wound on the plantar surface of the first metatarsal. Question osteomyelitis. EXAM: RIGHT FOOT COMPLETE - 3+ VIEW COMPARISON:  None. FINDINGS: Skin ulceration on the plantar surface of the foot at the first MTP joint is identified. Soft tissue swelling is seen about the joint. No bony destructive change is identified. The patient has severe first MTP osteoarthritis. Advanced degenerative disease is also present about the second and third tarsometatarsal joints. No fracture. No radiopaque foreign body or soft tissue gas. IMPRESSION: Skin ulceration subjacent to the first MTP joint. No plain film evidence of osteomyelitis. Severe osteoarthritis first MTP joint and second and third tarsometatarsal joints. Electronically Signed   By: Inge Rise  M.D.   On: 11/07/2017 08:41     CBC Recent Labs  Lab 11/29/17 1210 11/30/17 0329  WBC 30.5* 17.1*  HGB  10.9* 10.1*  HCT 32.8* 30.9*  PLT 227 211  MCV 98.8 99.7  MCH 32.8 32.6  MCHC 33.2 32.7  RDW 15.9* 16.1*  LYMPHSABS 3.1  --   MONOABS 0.9  --   EOSABS 0.0  --   BASOSABS 0.0  --     Chemistries  Recent Labs  Lab 11/29/17 1210 11/30/17 0329  NA 134* 140  K 3.7 3.7  CL 100* 106  CO2 26 27  GLUCOSE 88 128*  BUN 20 20  CREATININE 1.31* 1.27*  CALCIUM 8.4* 8.5*  AST 15  --   ALT 13*  --   ALKPHOS 51  --   BILITOT 1.3*  --    ------------------------------------------------------------------------------------------------------------------ No results for input(s): CHOL, HDL, LDLCALC, TRIG, CHOLHDL, LDLDIRECT in the last 72 hours.  Lab Results  Component Value Date   HGBA1C 6.2 (H) 11/18/2017   ------------------------------------------------------------------------------------------------------------------ No results for input(s): TSH, T4TOTAL, T3FREE, THYROIDAB in the last 72 hours.  Invalid input(s): FREET3 ------------------------------------------------------------------------------------------------------------------ No results for input(s): VITAMINB12, FOLATE, FERRITIN, TIBC, IRON, RETICCTPCT in the last 72 hours.  Coagulation profile Recent Labs  Lab 11/29/17 1210  INR 1.26    No results for input(s): DDIMER in the last 72 hours.  Cardiac Enzymes No results for input(s): CKMB, TROPONINI, MYOGLOBIN in the last 168 hours.  Invalid input(s): CK ------------------------------------------------------------------------------------------------------------------ No results found for: BNP   Roxan Hockey M.D on 11/30/2017 at 7:58 PM  Between 7am to 7pm - Pager - [phone]  After 7pm go to [email or web address] - password [email or web address]  [facility] -  Office  [phone]   Voice Recognition Viviann Spare dictation system was used to create this note,  attempts have been made to correct errors. Please contact the author with questions and/or clarifications.

## 2017-11-30 NOTE — Consult Note (Signed)
[facility] for Infectious Disease       Reason for Consult: chronic osteomyelitis    Referring Physician: Dr. 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 docusate sodium  100 mg Oral BID  . enoxaparin (LOVENOX) injection  40 mg Subcutaneous Q24H  . finasteride  5 mg Oral q morning - 10a  . lactobacillus acidophilus & bulgar  1 tablet Oral TID WC  . levothyroxine  88 mcg Oral QAC breakfast  . nystatin  5 mL Oral QID  . omega-3 acid ethyl esters  1 g Oral Daily  . pantoprazole  40 mg Oral Daily  . pravastatin  80 mg Oral QPM  . predniSONE  15 mg Oral Q breakfast  . predniSONE  7.5 mg Oral QPM  . [START ON 12/01/2017] testosterone cypionate  30 mg Intramuscular Q7 days  . vitamin B-12  1,000 mcg Oral Daily    Recommendations: Stop vancomycin and pip-tazo Start keflex 500 mg qid for cellulitis Outpatient follow up with orthopedics   Assessment: He has a recent abrasion of his shin and now with cellulitis.  He also has a chronic ulcer of his right foot and MRI with cortical destruction of lateral hallux sesamoid with a sinus tract from the ulcer to the sesamoid.  This is consistent with a chronic process with his chronic ulcer.  Surgical debridement may be indicated and Dr. Ninfa Linden has recommended continued outpatient orthopedic follow up.  I do not recommend long term antibiotics for osteomyelitis at this time pending surgerical debridement and reevaluation.  I can follow up with him after further debridement or orthopedic evaluation as indicated.   Chronic renal insufficiency - baseline creat 1.2-1.3.  Stable here and will stop vancomycin and piptazo.    Antibiotics: Vancomycin and pip-tazo day 2  HPI: [name] [name]. is a 84  y.o. male with a history of renal cell carcinoma with nephrectomy, panhypopituitarism and recurrent cellulitis came in yesterday with fever and right foot pain.  He was found to have cellulitis on his right leg from a wound on his shin and also noted his chronic ulcer that is improved since last visit.  He had a recent MRI by Dr. Dellia Nims as above and concern for osteomyelitis of the sesamoid bone at the site of the ulcer.  No new drainage, no surrounding erythema of the ulcer.  He does have some neuropathy.  He has remained afebrile since admission.    Review of Systems:  Constitutional: negative for fevers, chills and anorexia Gastrointestinal: negative for diarrhea All other systems reviewed and are negative    Past Medical History:  Diagnosis Date  . Allergic rhinitis    Prior allergy shots 20 years  . Anemia   . Anemia, B12 deficiency 2000  . Arthritis    Status post left total replacement 8 2011  . BPH (benign prostatic hyperplasia) 2013  . Cancer ([place])    renal cell ca and skin cancer   . Colitis 2014  . Difficult intubation 1994   surgery had to be  stopped due to injury to "throat"  . Difficult intubation 1994   no trouble since.  Required nasotracheal intubation  '[facility] / ANESTHESIA RECORD FROM 2013 AND 2016 IN  EPIC  . Glaucoma 2007   Status post left trabeculectomy 2007  . History of colon polyps    Colonoscopy 2001  . History of kidney stones   . History of renal cell carcinoma 1994   Status post right nephrectomy  . Hyperlipidemia 2003  . Hypertension   . 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 Hypothyroidism (acquired) 2000  . Nocturia   . Pituitary mass ([place]) 2000   S/p transphenoidal excision 05/1999 ([facility])  . Vitamin D deficiency 2009    Social History   Tobacco Use  . Smoking status: Former Smoker    Types: Pipe    Last attempt to quit: 09/26/1988    Years since quitting: 29.1  . Smokeless tobacco:  Never Used  . Tobacco comment: about 20 years  Substance Use Topics  . Alcohol use: Yes    Comment: 1.5 ounces of vodka nightly   . Drug use: No    Family History  Problem Relation Age of Onset  . Lung cancer Father   . Anesthesia problems Neg Hx   . Hypotension Neg Hx   . Malignant hyperthermia Neg Hx   . Pseudochol deficiency Neg Hx     Allergies  Allergen Reactions  . Adhesive [Tape]     CAUSES SKIN TEARS   . Percocet [Oxycodone-Acetaminophen] Other (See Comments)    Just doesn't like it  . Robaxin [Methocarbamol]   . Diovan [Valsartan] Rash    Physical Exam: Constitutional: in no apparent distress and alert  Vitals:   11/30/17 0437 11/30/17 0900  BP: 124/71 136/65  Pulse: 69 70  Resp: 17   Temp: 97.6 F (36.4 C) 98.2 F (36.8 C)  SpO2: 99% 100%   EYES: anicteric ENMT: no thrush Cardiovascular: Cor RRR Respiratory: CTA B; normal respiratory effort GI: Bowel sounds are normal, liver is not enlarged, spleen is not enlarged Musculoskeletal: right leg with cellulitis of shin, some blistering; right plantar area with ulcer, no drainage, dry.  Skin: negatives: no rash Hematologic: no cervical lad  Lab Results  Component Value Date   WBC 17.1 (H) 11/30/2017   HGB 10.1 (L) 11/30/2017   HCT 30.9 (L) 11/30/2017   MCV 99.7 11/30/2017   PLT 211 11/30/2017    Lab Results  Component Value Date   CREATININE 1.27 (H) 11/30/2017   BUN 20 11/30/2017   NA 140 11/30/2017   K 3.7 11/30/2017   CL 106 11/30/2017   CO2 27 11/30/2017    Lab Results  Component Value Date   ALT 13 (L) 11/29/2017   AST 15 11/29/2017   ALKPHOS 51 11/29/2017     Microbiology: Recent Results (from the past 240 hour(s))  Blood Culture (routine x 2)     Status: None (Preliminary result)   Collection Time: 11/29/17 12:10 PM  Result Value Ref Range Status   Specimen Description   Final    BLOOD RIGHT ANTECUBITAL Performed at [facility], [place] [address],  [place], [place] [address]    Special Requests   Final    BOTTLES DRAWN AEROBIC AND ANAEROBIC Blood Culture results may not be optimal due to an excessive volume of blood received in culture bottles Performed at [facility] [address] [place], [place] [address]    Culture   Final    NO GROWTH < 24 HOURS Performed at [facility] [address], [place], [place] [address]  Report Status PENDING  Incomplete  Blood Culture (routine x 2)     Status: None (Preliminary result)   Collection Time: 11/29/17 12:10 PM  Result Value Ref Range Status   Specimen Description   Final    BLOOD LEFT ANTECUBITAL Performed at [facility] [address], [place], [place] [address]    Special Requests   Final    BOTTLES DRAWN AEROBIC AND ANAEROBIC Blood Culture results may not be optimal due to an excessive volume of blood received in culture bottles Performed at [facility] [address], [place], [place] [address]    Culture   Final    NO GROWTH < 12 HOURS Performed at [facility] [address], [place], [place] [address]    Report Status PENDING  Incomplete    Thayer Headings, [facility] for Infectious Disease [place] [email or web address] [phone] pager  [phone] cell 11/30/2017, 2:04 PM

## 2017-12-01 LAB — CREATININE, SERUM
Creatinine, Ser: 1.64 mg/dL — ABNORMAL HIGH (ref 0.61–1.24)
GFR, EST AFRICAN AMERICAN: 40 mL/min — AB (ref >60)
GFR, EST NON AFRICAN AMERICAN: 35 mL/min — AB (ref >60)

## 2017-12-01 MED ORDER — DOXYCYCLINE HYCLATE 100 MG PO TABS
100.0000 mg | ORAL_TABLET | Freq: Two times a day (BID) | ORAL | Status: DC
  Administered 2017-12-01: 100 mg via ORAL
  Filled 2017-12-01: qty 1

## 2017-12-01 MED ORDER — CEPHALEXIN 500 MG PO CAPS: 500.0000 mg | ORAL_CAPSULE | Freq: Four times a day (QID) | ORAL | 0 refills | Status: AC

## 2017-12-01 MED ORDER — DOXYCYCLINE HYCLATE 100 MG PO TABS: 100.0000 mg | ORAL_TABLET | Freq: Two times a day (BID) | ORAL | 0 refills | Status: DC

## 2017-12-01 MED ORDER — LACTINEX PO CHEW: 1.0000 | CHEWABLE_TABLET | Freq: Three times a day (TID) | ORAL | 1 refills | Status: DC

## 2017-12-01 MED ORDER — OMEGA-3-ACID ETHYL ESTERS 1 G PO CAPS: 1.0000 g | ORAL_CAPSULE | Freq: Every day | ORAL | 2 refills | Status: DC

## 2017-12-01 NOTE — Discharge Summary (Signed)
[name] Mehta., is a 84 y.o. male  DOB Dec 13, 1922  MRN [id number].  Admission date:  11/29/2017  Admitting Physician  Janece Canterbury, MD  Discharge Date:  12/01/2017   Primary MD  [name], DO  Recommendations for primary care physician for things to follow:   1)Follow-up with wound clinic on Monday, 12/04/2017 2)Take antibiotics and probiotic as prescribed 3)Call or return if any fevers chills worsening redness, worsening warmth, worsening swelling or drainage from the right leg 4)Follow-up with Dr. 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 Allergic rhinitis    Prior allergy shots 20 years  . Anemia   . Anemia, B12 deficiency 2000  . Arthritis    Status post left total replacement 8 2011  . BPH (benign prostatic hyperplasia) 2013  . Cancer ([place])    renal cell ca and skin cancer   . Colitis 2014  . Difficult intubation 1994   surgery had to be  stopped due to injury to "throat"  . Difficult intubation 1994   no trouble since.  Required nasotracheal intubation  '02 [facility] / ANESTHESIA RECORD FROM 2013 AND 2016 IN EPIC  . Glaucoma 2007   Status post left trabeculectomy 2007  . History of colon polyps    Colonoscopy 2001  . History of kidney stones   . History of renal cell carcinoma 1994   Status post right nephrectomy  . Hyperlipidemia 2003  . Hypertension   . 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 Hypothyroidism (acquired) 2000  . Nocturia   . Pituitary mass ([place]) 2000   S/p transphenoidal excision 05/1999 ([facility])  . Vitamin D deficiency 2009    Past Surgical History:  Procedure Laterality Date  . BRAIN SURGERY  2000   pituatary gland removed .  Marland Kitchen CHOLECYSTECTOMY  1984  . Ectropion surgery Bilateral 2006  . EYE SURGERY  over last 6 yrs.     trabeculectomy...   . EYE SURGERY   cat ext ou  . JOINT REPLACEMENT  2011   knee left  . KNEE ARTHROSCOPY Right 02/06/2015   Procedure: RIGHT ARTHROSCOPY KNEE WITH LATERAL MENSICAL  DEBRIDEMENT;  Surgeon: Gaynelle Arabian, MD;  Location: [facility];  Service: Orthopedics;  Laterality: Right;  . Mohs procedure      for skin cancer on nose   . NEPHRECTOMY Right 1994   Renal cell  .  REVERSE SHOULDER ARTHROPLASTY  12/15/2011   Procedure: REVERSE SHOULDER ARTHROPLASTY;  Surgeon: Marin Shutter, MD;  Location: [place];  Service: Orthopedics;  Laterality: Right;  right total reverse shoulder  . TONSILLECTOMY    . TOTAL KNEE ARTHROPLASTY Right 06/29/2015   Procedure: TOTAL KNEE ARTHROPLASTY;  Surgeon: Gaynelle Arabian, MD;  Location: [facility];  Service: Orthopedics;  Laterality: Right;  . Transsphenoidal excision pituitary tumor  05/1999   A.Tommi Rumps, M.D.([facility])       HPI  from the history and physical done on the day of admission:      Patient coming from:  [facility].  Uses cane but not walker  Chief Complaint: foot pain  HPI: [name] Borquez. is a 84 y.o. male with history of panhypopituitarism, renal cell carcinoma, recurrent cellulitis of the right foot who presents with increasing fevers and right foot pain.  The patient was admitted from 3/22-3/24 due to right foot cellulitis.  He was discharged on doxycycline and [facility] skilled nursing facility.  He  had been noted to have an elevated sed rate.  MRI was performed as an outpatient on 3/29 which demonstrated a soft tissue ulcer of the plantar aspect of the first metatarsal head with associated severe soft tissue and edema, evidence of osteomyelitis of the lateral hallux sesamoid bone and possibly of the medial hallux sesamoid.  He also had severe joint space narrowing at the first MTP joint consistent with severe osteoarthritis versus prior septic arthritis.  There was severe tendinosis of the flexor digitorum longus with high-grade partial-thickness tear at the level of the first MTP joint.  Despite compliance with his oral antibiotics, he developed recurrent fevers to greater than 101F on 4/2 with fatigue and was brought back to the emergency department on 4/3.  He denied worsening pain or swelling of his right foot.  He had some mild nausea but denied vomiting or diarrhea, cough, sinus congestion.  He has had a mild sore throat  ED Course: Vital signs essentially stable.  Labs: White blood cell count 30.5.  Lactic acid negative.  He was started on vancomycin and Zosyn.  Blood cultures were obtained.  The emergency department physician contacted Dr. Linus Salmons from infectious disease for assistance in management.  Blood cultures from his previous admission remained no growth.      Hospital Course:      1)Rt Shin Abrasion and secondary cellulitis of the right leg--- this is unrelated to patient's right foot ulcer which was recently treated;  there is clear demarcation of erythema and areas of normal skin and soft tissue between these 2 open lesions of the skin.  Overall right shin on right leg area cellulitis appears to have improved, infectious disease and orthopedic input appreciated, okay to stop IV vancomycin and Zosyn, okay to discharge home on p.o. doxycycline and Keflex  White count is down to 17,000 from 30,000, suspect mostly steroid-induced leukocytosis  2)RT FOOT infectedUlcer of R foot  cellulitis -resolved mostly, patient has a dry ulcer crater around the first metatarsal area on the sole of the right foot , patient sees Dr. Dellia Nims and wound care center, MRI of the right foot shows possible osteomyelitis of the sesamoid bone and tenosynovitis, Dr. Ninfa Linden from orthopedic surgery has been consulted.  Patient has pan hypopituitarism so typically with admissions he gets high-dose steroids/stress dose steroids this leads to persistent leukocytosis which may confuse the clinical picture with regards to infection.  Right lower extremity venous Dopplers and ABI-significant abnormalities.  Follow-up with Dr. Ninfa Linden as outpatient for possible surgical intervention for tenosynovitis and sesamoid bone osteomyelitis  3)panhypopituitarism/adrenal insufficiency after prior adenoma resection- -prior to admission patient was on prednisone 5 mg daily and testosterone and levothyroxine,   4)CKD III-appears stable at this time, at baseline patient has CKD stage III, maintain adequate hydration, avoid nephrotoxic agents  5)Vitamin B12 deficiency, stable, continue vit B12   6)BPH,stable, continue finasteride   Discharge Condition: [facility]  Follow UP-Dr. Dellia Nims at wound care center and Dr. Ninfa Linden orthopedics   Consults obtained -orthopedics/ID  Diet and Activity recommendation:  As advised  Discharge Instructions     Discharge Instructions    Call MD for:  difficulty breathing, headache or visual disturbances   Complete by:  As directed    Call MD for:  persistant dizziness or light-headedness   Complete by:  As directed    Call MD for:  persistant nausea and vomiting   Complete by:  As directed    Call MD for:  redness, tenderness, or signs of infection (pain, swelling, redness, odor or green/yellow discharge around incision site)   Complete by:  As directed    Call MD for:  severe uncontrolled pain   Complete by:  As directed    Call MD for:  temperature >100.4   Complete  by:  As directed    Diet - low sodium heart healthy   Complete by:  As directed    Discharge instructions   Complete by:  As directed    1) follow-up with wound clinic on Monday, 12/04/2017 2) take antibiotics and probiotic as prescribed 3) call or return if any fevers chills worsening redness, worsening warmth, worsening swelling or drainage from the right leg 4) follow-up with Dr. Ninfa Linden the orthopedic surgeon as outpatient as advised   Increase activity slowly   Complete by:  As directed         Discharge Medications     Allergies as of 12/01/2017      Reactions   Adhesive [tape]    CAUSES SKIN TEARS   Percocet [oxycodone-acetaminophen] Other (See Comments)   Just doesn't like it   Robaxin [methocarbamol]    Diovan [valsartan] Rash      Medication List    STOP taking these medications   cefdinir 300 MG capsule Commonly known as:  OMNICEF     TAKE these medications   acetaminophen 500 MG tablet Commonly known as:  TYLENOL Take 1,000 mg by mouth daily as needed for fever.   cephALEXin 500 MG capsule Commonly known as:  KEFLEX Take 1 capsule (500 mg total) by mouth 4 (four) times daily for 7 days.   Coenzyme Q10 300 MG Caps Take 1 capsule by mouth daily.   doxycycline 100 MG tablet Commonly known as:  VIBRA-TABS Take 1 tablet (100 mg total) by mouth 2 (two) times daily.   EQL FLAXSEED OIL 1200 MG Caps Take as directed once a day   finasteride 5 MG tablet Commonly known as:  PROSCAR Take 5 mg by mouth every morning.   Fish Oil 1000 MG Caps Take 1,000 mg by mouth daily.   lactobacillus acidophilus & bulgar chewable tablet Chew 1 tablet by mouth 3 (three) times daily with meals.   MULTIVITAMIN & MINERAL PO Take 1 tablet by mouth daily.   omega-3 acid ethyl esters 1 g capsule Commonly known as:  LOVAZA Take 1 capsule (1 g total) by mouth daily. Start taking on:  12/02/2017   pantoprazole 40  MG tablet Commonly known as:  PROTONIX TAKE 1 TABLET BY  MOUTH DAILY.   pravastatin 80 MG tablet Commonly known as:  PRAVACHOL Take 80 mg by mouth every evening.   predniSONE 10 MG tablet Commonly known as:  DELTASONE Take 5-10 mg by mouth 2 (two) times daily. Take 1 tablet (10 mg) in the am and Take 1/2 tablet (5 mg) in the evening.   SYNTHROID 88 MCG tablet Generic drug:  levothyroxine Take 88 mcg by mouth daily before breakfast.   testosterone cypionate 200 MG/ML injection Commonly known as:  DEPOTESTOSTERONE CYPIONATE Inject 30 mg (0.15 ml) intramuscularly weekly. Must discard vial after 90 days.   vitamin B-12 1000 MCG tablet Commonly known as:  CYANOCOBALAMIN Take 1,000 mcg by mouth daily.   Vitamin D3 5000 units Tabs Take 1 tablet every day       Major procedures and Radiology Reports - PLEASE review detailed and final reports for all details, in brief -    Dg Chest 2 View  Result Date: 11/18/2017 CLINICAL DATA:  Initial evaluation for acute fever. EXAM: CHEST - 2 VIEW COMPARISON:  Prior radiograph from 10/13/2016. FINDINGS: Mild cardiomegaly, stable. Mediastinal silhouette within normal limits. Aortic atherosclerosis. Lungs are hypoinflated. Mild bibasilar atelectatic changes and/or scarring. No consolidative airspace disease. No pulmonary edema or significant pleural effusion. No pneumothorax. Right shoulder arthroplasty noted.  No acute osseous abnormality. IMPRESSION: 1. Shallow lung inflation with mild bibasilar atelectasis and/or scarring. 2. No other active cardiopulmonary disease. 3. Aortic atherosclerosis. Electronically Signed   By: Jeannine Boga M.D.   On: 11/18/2017 01:28   Mr Foot Right W Wo Contrast  Result Date: 11/24/2017 CLINICAL DATA:  Cellulitis of right lower extremity. Ulcer on right foot near toes pt has had for months. EXAM: MRI OF THE RIGHT FOREFOOT WITHOUT AND WITH CONTRAST TECHNIQUE: Multiplanar, multisequence MR imaging of the right foot was performed before and after the administration of  intravenous contrast. CONTRAST:  41mL MULTIHANCE GADOBENATE DIMEGLUMINE 529 MG/ML IV SOLN COMPARISON:  None. FINDINGS: Bones/Joint/Cartilage Soft tissue ulcer along the plantar aspect of the first metatarsal head with a sinus tract extending to the lateral hallux sesamoid. Severe soft tissue edema and enhancement of the soft tissues overlying the first MTP joint most concerning for cellulitis. Cortical destruction of lateral hallux sesamoid with severe marrow edema and enhancement. Marrow edema of medial hallux sesamoid without definite cortical destruction or T1 signal abnormality. Severe joint space narrowing with full-thickness cartilage loss and subchondral cystic changes involving the first MTP joint consistent with severe osteoarthritis. No bone destruction or periosteal reaction. No fracture or dislocation. Normal alignment. No joint effusion. Ligaments Collateral ligaments are intact.  Lisfranc ligament is intact. Muscles and Tendons Severe tendinosis of flexor digitorum longus with a high-grade partial-thickness tear at the level of the first MTP joint. Generalized muscle atrophy. Soft tissue No fluid collection or hematoma.  No soft tissue mass. IMPRESSION: 1. Soft tissue ulcer along the plantar aspect of the first metatarsal head with a sinus tract extending to the lateral hallux sesamoid. Severe soft tissue edema and enhancement of the soft tissues overlying the first MTP joint most concerning for cellulitis. Cortical destruction of lateral hallux sesamoid with severe marrow edema and enhancement concerning for osteomyelitis. Marrow edema of medial hallux sesamoid without definite cortical destruction or T1 signal abnormality concerning for mild osteomyelitis versus reactive marrow edema secondary to adjacent inflammation. 2. Severe joint space narrowing with full-thickness cartilage loss and subchondral cystic changes involving the first MTP  joint consistent with severe osteoarthritis which may related  to prior septic arthritis. 3. Severe tendinosis of flexor digitorum longus with a high-grade partial-thickness tear at the level of the first MTP joint. Electronically Signed   By: Kathreen Devoid   On: 11/24/2017 14:55   Dg Chest Port 1 View  Result Date: 11/29/2017 CLINICAL DATA:  Code sepsis. Inflammatory changes of the right lower extremity. Fever. EXAM: PORTABLE CHEST 1 VIEW COMPARISON:  Chest x-ray of November 18, 2017 FINDINGS: The lungs are well-expanded. Small amounts of pleural fluid are likely present at both lung bases. There is no alveolar infiltrate. The cardiac silhouette is enlarged. The pulmonary vascularity is normal. There is calcification in the wall of the aortic arch. The trachea is midline. The bony thorax exhibits no acute abnormality. IMPRESSION: Probable small bilateral pleural effusions. No alveolar pneumonia nor CHF. Mild cardiomegaly. Thoracic aortic atherosclerosis. Electronically Signed   By: [name]  Martinique M.D.   On: 11/29/2017 13:00   Dg Foot Complete Right  Result Date: 11/17/2017 CLINICAL DATA:  Leg cellulitis and foot wound. EXAM: RIGHT FOOT COMPLETE - 3+ VIEW COMPARISON:  None. FINDINGS: Apart from some interval increase in soft tissue swelling about the base of the great toe, no significant change in the osseous appearance of the foot with severe osteoarthritic joint space narrowing of the first MTP with subchondral degenerative cystic change. No bony sequestrum is noted. Advanced degenerative changes are present of the second third TMT articulations. No acute fracture or foreign body. No soft tissue gas. No soft tissue tophus. Shallow ulceration along the plantar aspect of the forefoot as before. IMPRESSION: Some progression of soft tissue swelling without frank bone destruction involving the forefoot and in particular the great toe. Redemonstration of severe osteoarthritis of the first MTP and mild to moderate degenerative change at the base of the second and third TMT joints.  Electronically Signed   By: Ashley Royalty M.D.   On: 11/17/2017 23:22   Dg Foot Complete Right  Result Date: 11/07/2017 CLINICAL DATA:  Nonhealing wound on the plantar surface of the first metatarsal. Question osteomyelitis. EXAM: RIGHT FOOT COMPLETE - 3+ VIEW COMPARISON:  None. FINDINGS: Skin ulceration on the plantar surface of the foot at the first MTP joint is identified. Soft tissue swelling is seen about the joint. No bony destructive change is identified. The patient has severe first MTP osteoarthritis. Advanced degenerative disease is also present about the second and third tarsometatarsal joints. No fracture. No radiopaque foreign body or soft tissue gas. IMPRESSION: Skin ulceration subjacent to the first MTP joint. No plain film evidence of osteomyelitis. Severe osteoarthritis first MTP joint and second and third tarsometatarsal joints. Electronically Signed   By: Inge Rise M.D.   On: 11/07/2017 08:41    Micro Results    Recent Results (from the past 240 hour(s))  Blood Culture (routine x 2)     Status: None (Preliminary result)   Collection Time: 11/29/17 12:10 PM  Result Value Ref Range Status   Specimen Description   Final    BLOOD RIGHT ANTECUBITAL Performed at [facility] [address], [place], [place] [address]    Special Requests   Final    BOTTLES DRAWN AEROBIC AND ANAEROBIC Blood Culture results may not be optimal due to an excessive volume of blood received in culture bottles Performed at [facility] [address] [place], [place] [address]    Culture   Final    NO GROWTH 2  DAYS Performed at [facility], [place] [address], [place], [place] [address]    Report Status PENDING  Incomplete  Blood Culture (routine x 2)     Status: None (Preliminary result)   Collection Time: 11/29/17 12:10 PM  Result Value Ref Range Status   Specimen Description   Final    BLOOD LEFT ANTECUBITAL Performed at [facility] [address] [place], [place] [address]    Special Requests   Final    BOTTLES DRAWN AEROBIC AND ANAEROBIC Blood Culture results may not be optimal due to an excessive volume of blood received in culture bottles Performed at [facility] [address] [place], [place] [address]    Culture   Final    NO GROWTH 2 DAYS Performed at [facility] [address], [place], [place] [address]    Report Status PENDING  Incomplete  Urine culture     Status: None   Collection Time: 11/29/17 12:10 PM  Result Value Ref Range Status   Specimen Description   Final    URINE, CLEAN CATCH Performed at [facility], [place] [address] [place], [place] [address]    Special Requests   Final    Normal Performed at [facility], [place] [address], [place], [place] [address]    Culture   Final    NO GROWTH Performed at [facility], [place] [address], [place], [place] [address]    Report Status 11/30/2017 FINAL  Final       Today   Subjective    [name] [name] today has no new complaints, eager to go home, no fevers          Patient has been seen and examined prior to discharge   Objective   Blood pressure (!) 167/68, pulse 70, temperature 98 F (36.7 C), temperature source Oral, resp. rate 16, height 5\' 11"  (1.803 m), weight 81.9 kg (180 lb 8.9 oz), SpO2 99 %.   Intake/Output Summary (Last 24 hours) at 12/01/2017 1313 Last data filed at 12/01/2017 0640 Gross per 24 hour  Intake 240 ml  Output 500 ml  Net -260 ml    Exam  Gen:- Awake Alert,  In   HEENT:- .AT, No sclera icterus Neck-Supple Neck,No JVD,.  Lungs-  CTAB , good air movement bilaterally CV- S1, S2 normal Abd-  +ve B.Sounds, Abd Soft, No tenderness,    Extremity/Skin:-Improving  erythema , swelling warmth and tenderness over the right shin  And right leg area with an open wound over the upper part of the right shin without significant purulent  drainage , Rt ankle/foot without erythema, warmth or swelling. He does have a dry ulcer crater along the first metatarsal area (Plantar surface)just behind the great toe(first metatarsalarea).  The foot and the ankle does not look infected, however the right leg and shin area looks infected/cellulitic-improving psych-affect is appropriate, oriented x3, very impressive cognitive abilities for his age Neuro-no new focal deficits, no tremors     Data Review   CBC w Diff:  Lab Results  Component Value Date   WBC 17.1 (H) 11/30/2017   HGB 10.1 (L) 11/30/2017   HCT 30.9 (L) 11/30/2017   PLT 211 11/30/2017   LYMPHOPCT 10 11/29/2017   MONOPCT 3 11/29/2017   EOSPCT 0 11/29/2017   BASOPCT 0 11/29/2017    CMP:  Lab Results  Component Value Date   NA 140 11/30/2017   NA  140 06/12/2017   K 3.7 11/30/2017   CL 106 11/30/2017   CO2 27 11/30/2017   BUN 20 11/30/2017   BUN 25 (A) 06/12/2017   CREATININE 1.64 (H) 12/01/2017   GLU 97 06/12/2017   PROT 5.5 (L) 11/29/2017   ALBUMIN 2.7 (L) 11/29/2017   BILITOT 1.3 (H) 11/29/2017   ALKPHOS 51 11/29/2017   AST 15 11/29/2017   ALT 13 (L) 11/29/2017  .   Total Discharge time is about 33 minutes  Roxan Hockey M.D on 12/01/2017 at 1:13 PM  [facility]   Office  [phone]  Voice Recognition Viviann Spare dictation system was used to create this note, attempts have been made to correct errors. Please contact the author with questions and/or clarifications.

## 2017-12-04 ENCOUNTER — Telehealth: Payer: Self-pay

## 2017-12-04 DIAGNOSIS — G9009 Other idiopathic peripheral autonomic neuropathy: Secondary | ICD-10-CM | POA: Diagnosis not present

## 2017-12-04 DIAGNOSIS — M86172 Other acute osteomyelitis, left ankle and foot: Secondary | ICD-10-CM | POA: Diagnosis not present

## 2017-12-04 DIAGNOSIS — L97514 Non-pressure chronic ulcer of other part of right foot with necrosis of bone: Secondary | ICD-10-CM | POA: Diagnosis not present

## 2017-12-04 DIAGNOSIS — R609 Edema, unspecified: Secondary | ICD-10-CM | POA: Diagnosis not present

## 2017-12-04 DIAGNOSIS — G909 Disorder of the autonomic nervous system, unspecified: Secondary | ICD-10-CM | POA: Diagnosis not present

## 2017-12-04 DIAGNOSIS — L97516 Non-pressure chronic ulcer of other part of right foot with bone involvement without evidence of necrosis: Secondary | ICD-10-CM | POA: Diagnosis not present

## 2017-12-04 LAB — CULTURE, BLOOD (ROUTINE X 2)
CULTURE: NO GROWTH
CULTURE: NO GROWTH

## 2017-12-04 NOTE — Telephone Encounter (Signed)
I have made the 1st attempt to contact the patient or family member in charge, in order to follow up from recently being discharged from the hospital.There was no option to leave a voicemail but I will make another attempt at a different time.

## 2017-12-04 NOTE — Telephone Encounter (Signed)
I have made the 2nd attempt to contact the patient or family member in charge, in order to follow up from recently being discharged from the hospital.There was no option to leave a voicemail but I will make another attempt at a different time.

## 2017-12-05 ENCOUNTER — Telehealth: Payer: Self-pay

## 2017-12-05 ENCOUNTER — Telehealth: Payer: Self-pay | Admitting: *Deleted

## 2017-12-05 DIAGNOSIS — L97519 Non-pressure chronic ulcer of other part of right foot with unspecified severity: Secondary | ICD-10-CM | POA: Diagnosis not present

## 2017-12-05 NOTE — Telephone Encounter (Signed)
Spoke with patient and advised results, appt rescheduled

## 2017-12-05 NOTE — Telephone Encounter (Signed)
Pt's appt schedule by [facility] for first week of May at his request so will not be TOC, just routine f/u.

## 2017-12-05 NOTE — Telephone Encounter (Signed)
-----   Message from Titus Dubin, RN sent at 12/05/2017 11:52 AM EDT ----- [name] [name] in contact with [name] [name] for [place] call. He wanted me to pass along to you that he thinks he will have difficulty getting to the clinic for his appointment tomorrow morning due to his foot and its swelling and needing to keep it elevated. He requested the visit to be rescheduled for next week instead and told him Karena Addison would give him a call for appointments

## 2017-12-05 NOTE — Telephone Encounter (Signed)
Transition Care Management Follow-Up Telephone Call   Date discharged and where: [facility] on 11/12/2017  How have you been since you were released from the hospital?  No fevers, or worsening swelling or redness. Has orthopedic surgeon appointment scheduled to discuss. Has been keeping his foot elevated to decrease swelling  Any patient concerns? None  Items Reviewed:   Meds: Y, taking probiotics and antibitics  Allergies: Y  Dietary Changes Reviewed: Y  Functional Questionnaire:  Independent-I Dependent-D  ADLs:   Dressing- I    Eating-I   Maintaining continence- I   Transferring- I w/ walker or cane   Transportation- D   Meal Prep- I   Managing Meds- I  Confirmed importance and Date/Time of follow-up visits scheduled: Yes, Dr. Mariea Clonts 12/06/2017 @ 11 am. Patient requested to reschedule to next week when it may be easier for him to get to the clinic   Confirmed with patient if condition worsens to call PCP or go to the Emergency Dept. Patient was given office number and encouraged to call back with questions or concerns: Yes

## 2017-12-06 ENCOUNTER — Encounter: Payer: Self-pay | Admitting: Internal Medicine

## 2017-12-11 ENCOUNTER — Encounter: Payer: Self-pay | Admitting: Family

## 2017-12-11 ENCOUNTER — Ambulatory Visit: Payer: PPO | Admitting: Internal Medicine

## 2017-12-11 ENCOUNTER — Other Ambulatory Visit: Payer: Self-pay

## 2017-12-11 ENCOUNTER — Ambulatory Visit (INDEPENDENT_AMBULATORY_CARE_PROVIDER_SITE_OTHER): Payer: PPO | Admitting: Family

## 2017-12-11 VITALS — Ht 71.0 in | Wt 169.0 lb

## 2017-12-11 DIAGNOSIS — L089 Local infection of the skin and subcutaneous tissue, unspecified: Secondary | ICD-10-CM | POA: Diagnosis not present

## 2017-12-11 DIAGNOSIS — L97516 Non-pressure chronic ulcer of other part of right foot with bone involvement without evidence of necrosis: Secondary | ICD-10-CM | POA: Diagnosis not present

## 2017-12-11 DIAGNOSIS — M869 Osteomyelitis, unspecified: Secondary | ICD-10-CM

## 2017-12-11 DIAGNOSIS — L97514 Non-pressure chronic ulcer of other part of right foot with necrosis of bone: Secondary | ICD-10-CM | POA: Diagnosis not present

## 2017-12-11 DIAGNOSIS — I872 Venous insufficiency (chronic) (peripheral): Secondary | ICD-10-CM | POA: Diagnosis not present

## 2017-12-11 DIAGNOSIS — L03115 Cellulitis of right lower limb: Secondary | ICD-10-CM | POA: Diagnosis not present

## 2017-12-11 NOTE — Assessment & Plan Note (Signed)
Cellulitis of the right foot and ankle appears resolved with previously prescribed Keflex.  No additional treatment necessary at this time.  Continue to monitor and follow-up if symptoms return.

## 2017-12-11 NOTE — Assessment & Plan Note (Addendum)
Diagnosed with osteomyelitis/deep infection on 3/29 with MRI of the right foot and currently treated with doxycycline.  There is potential for surgical debridement with pending appointment on 4/17 with Dr. Ninfa Linden.  Recommend holding antibiotics at this time pending surgical intervention to allow for culture to be obtained during surgery.  Discussed with patient and he is amenable to the plan.  No current systemic symptoms presently.  Additional treatment/antibiotics pending potential surgical intervention which may be curative.

## 2017-12-11 NOTE — Patient Instructions (Signed)
Very nice to meet you!  We will await Dr. Trevor Mace plan for the potential of surgical intervention.  Recommend holding antibiotics with potential for surgery to enable cultures and also which may be curative.   Continue with wound care per Dr. Dellia Nims.  We will follow up pending treatment plan with Dr. Ninfa Linden.

## 2017-12-11 NOTE — Progress Notes (Signed)
Subjective:    Patient ID: [name] [name]., male    DOB: 1922-10-23, 84 y.o.   MRN: [id number]  Chief Complaint  Patient presents with  . New Patient (Initial Visit)    completed keflex, still on doxycycline  . Osteomyelitis     HPI:  [name] [name]. is a 84 y.o. male who presents today for initial evaluation and treatment of osteomyelitis.   [name] was previously evaluated in the hospital by Dr. Linus Salmons when he was admitted for cellulitis and osteomyelitis. He was first noted to have a wound located on the base of his first metatarsal by podiatry with first records available with wound care being 11/06/17. MRI from 11/24/17 revealed a soft tissue ulcer along the plantar aspect of the first metatarsal head with a sinus tract extending to the lateral hallux sesamoid.  Severe soft tissue edema and enhancement of the soft tissues overlying the first MTP joint concerning for cellulitis.  Cortical destruction of the lateral hallux sesamoid with severe marrow edema and enhancement concerning for osteomyelitis.  Marrow edema of the medial hallux sesamoid without definite cortical destruction or T1 signal abnormality concerning for mild osteomyelitis versus reactive marrow edema secondary to adjacent inflammation.  He was started on Keflex for cellulitis with no current treatment for osteomyelitis with the intentions of possible surgical debridement by orthopedics. CRP in the hospital was 0.83. He continues to work with Dr. Dellia Nims of [place] with Silver collagen with dressing changes. All hospital/outpatient labs, imaging and records were reviewed in detail.   Since being evaluated in the hospital he reports completing the Keflex without complication and improvement of the cellulitis. He was seen by Dr. Ninfa Linden with the potential for surgical debridement of his right foot and has a follow up appointment on 4/17 with a high likelihood that he will undergo surgery. Denies any pain, fevers,  chills, weight loss or night sweats currently. He continues to walk with a cane.    Allergies  Allergen Reactions  . Adhesive [Tape]     CAUSES SKIN TEARS   . Percocet [Oxycodone-Acetaminophen] Other (See Comments)    Just doesn't like it  . Robaxin [Methocarbamol]   . Diovan [Valsartan] Rash      Outpatient Medications Prior to Visit  Medication Sig Dispense Refill  . acetaminophen (TYLENOL) 500 MG tablet Take 1,000 mg by mouth daily as needed for fever.    . Cholecalciferol (VITAMIN D3) 5000 units TABS Take 1 tablet every day    . Coenzyme Q10 300 MG CAPS Take 1 capsule by mouth daily.    Marland Kitchen doxycycline (VIBRA-TABS) 100 MG tablet Take 1 tablet (100 mg total) by mouth 2 (two) times daily. 14 tablet 0  . finasteride (PROSCAR) 5 MG tablet Take 5 mg by mouth every morning.     . Flaxseed, Linseed, (EQL FLAXSEED OIL) 1200 MG CAPS Take as directed once a day    . lactobacillus acidophilus & bulgar (LACTINEX) chewable tablet Chew 1 tablet by mouth 3 (three) times daily with meals. 90 tablet 1  . Multiple Vitamins-Minerals (MULTIVITAMIN & MINERAL PO) Take 1 tablet by mouth daily.    Marland Kitchen omega-3 acid ethyl esters (LOVAZA) 1 g capsule Take 1 capsule (1 g total) by mouth daily. 30 capsule 2  . Omega-3 Fatty Acids (FISH OIL) 1000 MG CAPS Take 1,000 mg by mouth daily.     . pantoprazole (PROTONIX) 40 MG tablet TAKE 1 TABLET BY MOUTH DAILY. 90 tablet 0  .  pravastatin (PRAVACHOL) 80 MG tablet Take 80 mg by mouth every evening.     . predniSONE (DELTASONE) 10 MG tablet Take 5-10 mg by mouth 2 (two) times daily. Take 1 tablet (10 mg) in the am and Take 1/2 tablet (5 mg) in the evening.    Marland Kitchen SYNTHROID 88 MCG tablet Take 88 mcg by mouth daily before breakfast.     . testosterone cypionate (DEPOTESTOSTERONE CYPIONATE) 200 MG/ML injection Inject 30 mg (0.15 ml) intramuscularly weekly. Must discard vial after 90 days.    . vitamin B-12 (CYANOCOBALAMIN) 1000 MCG tablet Take 1,000 mcg by mouth daily.      No facility-administered medications prior to visit.      Past Medical History:  Diagnosis Date  . Allergic rhinitis    Prior allergy shots 20 years  . Anemia   . Anemia, B12 deficiency 2000  . Arthritis    Status post left total replacement 8 2011  . BPH (benign prostatic hyperplasia) 2013  . Cancer ([place])    renal cell ca and skin cancer   . Colitis 2014  . Difficult intubation 1994   surgery had to be  stopped due to injury to "throat"  . Difficult intubation 1994   no trouble since.  Required nasotracheal intubation  '02 [facility] / ANESTHESIA RECORD FROM 2013 AND 2016 IN EPIC  . Glaucoma 2007   Status post left trabeculectomy 2007  . History of colon polyps    Colonoscopy 2001  . History of kidney stones   . History of renal cell carcinoma 1994   Status post right nephrectomy  . Hyperlipidemia 2003  . Hypertension   . 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 Hypothyroidism (acquired) 2000  . Nocturia   . Pituitary mass ([place]) 2000   S/p transphenoidal excision 05/1999 ([facility])  . Vitamin D deficiency 2009      Past Surgical History:  Procedure Laterality Date  . BRAIN SURGERY  2000   pituatary gland removed .  Marland Kitchen CHOLECYSTECTOMY  1984  . Ectropion surgery Bilateral 2006  . EYE SURGERY  over last 6 yrs.     trabeculectomy...   . EYE SURGERY   cat ext ou  . JOINT REPLACEMENT  2011   knee left  . KNEE ARTHROSCOPY Right 02/06/2015   Procedure: RIGHT ARTHROSCOPY KNEE WITH LATERAL MENSICAL  DEBRIDEMENT;  Surgeon: Gaynelle Arabian, MD;  [facility];  Service: Orthopedics;  Laterality: Right;  . Mohs procedure      for skin cancer on nose   . NEPHRECTOMY Right 1994   Renal cell  . REVERSE SHOULDER ARTHROPLASTY  12/15/2011   Procedure: REVERSE SHOULDER ARTHROPLASTY;  Surgeon: Marin Shutter, MD;  Location: [place];  Service: Orthopedics;  Laterality: Right;  right total reverse shoulder  . TONSILLECTOMY    . TOTAL  KNEE ARTHROPLASTY Right 06/29/2015   Procedure: TOTAL KNEE ARTHROPLASTY;  Surgeon: Gaynelle Arabian, MD;  [facility];  Service: Orthopedics;  Laterality: Right;  . Transsphenoidal excision pituitary tumor  05/1999   A.Tommi Rumps, M.D.([facility])      Family History  Problem Relation Age of Onset  . Lung cancer Father   . Anesthesia problems Neg Hx   . Hypotension Neg Hx   . Malignant hyperthermia Neg Hx   . Pseudochol deficiency Neg Hx       Social History   Socioeconomic History  . Marital status: Widowed    Spouse name: Not on file  .  Number of children: Not on file  . Years of education: Not on file  . Highest education level: Not on file  Occupational History  . Not on file  Social Needs  . Financial resource strain: Not on file  . Food insecurity:    Worry: Not on file    Inability: Not on file  . Transportation needs:    Medical: Not on file    Non-medical: Not on file  Tobacco Use  . Smoking status: Former Smoker    Types: Pipe    Last attempt to quit: 09/26/1988    Years since quitting: 29.2  . Smokeless tobacco: Never Used  . Tobacco comment: about 20 years  Substance and Sexual Activity  . Alcohol use: Yes    Comment: 1.5 ounces of vodka nightly   . Drug use: No  . Sexual activity: Not Currently  Lifestyle  . Physical activity:    Days per week: Not on file    Minutes per session: Not on file  . Stress: Not on file  Relationships  . Social connections:    Talks on phone: Not on file    Gets together: Not on file    Attends religious service: Not on file    Active member of club or organization: Not on file    Attends meetings of clubs or organizations: Not on file    Relationship status: Not on file  . Intimate partner violence:    Fear of current or ex partner: Not on file    Emotionally abused: Not on file    Physically abused: Not on file    Forced sexual activity: Not on file  Other Topics Concern  . Not on file  Social History Narrative    Patient is Married Software engineer). Retired Engineer, mining) Administrator, sports. Lives in apartment, Independent Living section at [facility] since 2000.  Spouse lives in skilled nursing section in same community   Smoking 1994 , moderate alcohol use: 2 drinks a night. Exercises regularly with walking and exercise classes. Continues to drive   Patient has Advanced planning documents: Living Will                  Review of Systems  Constitutional: Negative for chills and fever.  Respiratory: Negative for chest tightness and shortness of breath.   Cardiovascular: Negative for chest pain.  Gastrointestinal: Negative for constipation, diarrhea, nausea and vomiting.  Skin: Positive for wound. Negative for color change and rash.  Neurological: Positive for numbness.       Objective:    Ht 5\' 11"  (1.803 m)   Wt 169 lb (76.7 kg)   BMI 23.57 kg/m  Nursing note and vital signs reviewed.  Physical Exam  Constitutional: He is oriented to person, place, and time. He appears well-developed and well-nourished. No distress.  Walks with a cane; seated in the chair and pleasant.   Cardiovascular: Normal rate, regular rhythm, normal heart sounds and intact distal pulses.  Pulmonary/Chest: Effort normal and breath sounds normal.  Neurological: He is alert and oriented to person, place, and time.  Skin: Skin is warm and dry.  Wound appears as below with a small amount of clear serous drainage. There is no tenderness.   Psychiatric: He has a normal mood and affect. His behavior is normal. Judgment and thought content normal.           Assessment & Plan:   Problem List Items Addressed This Visit  Musculoskeletal and Integument   Cellulitis of right foot    Cellulitis of the right foot and ankle appears resolved with previously prescribed Keflex.  No additional treatment necessary at this time.  Continue to monitor and follow-up if symptoms return.       Osteomyelitis ([place]) - Primary    Diagnosed with osteomyelitis/deep infection on 3/29 with MRI of the right foot and currently treated with doxycycline.  There is potential for surgical debridement with pending appointment on 4/17 with Dr. Ninfa Linden.  Recommend holding antibiotics at this time pending surgical intervention to allow for culture to be obtained during surgery.  Discussed with patient and he is amenable to the plan.  No current systemic symptoms presently.  Additional treatment/antibiotics pending potential surgical intervention which may be curative.          I am having [name] [name]. maintain his finasteride, pravastatin, predniSONE, vitamin B-12, Multiple Vitamins-Minerals (MULTIVITAMIN & MINERAL PO), Coenzyme Q10, Vitamin D3, testosterone cypionate, Fish Oil, SYNTHROID, EQL FLAXSEED OIL, pantoprazole, acetaminophen, doxycycline, omega-3 acid ethyl esters, and lactobacillus acidophilus & bulgar.   Follow-up:  Pending office visit with Dr. Ninfa Linden to determine surgical intervention.    Mauricio Po, [facility]ious [facility]

## 2017-12-13 ENCOUNTER — Encounter (INDEPENDENT_AMBULATORY_CARE_PROVIDER_SITE_OTHER): Payer: Self-pay | Admitting: Orthopedic Surgery

## 2017-12-13 ENCOUNTER — Ambulatory Visit (INDEPENDENT_AMBULATORY_CARE_PROVIDER_SITE_OTHER): Payer: PPO | Admitting: Orthopedic Surgery

## 2017-12-13 VITALS — Ht 71.0 in | Wt 169.0 lb

## 2017-12-13 DIAGNOSIS — M86271 Subacute osteomyelitis, right ankle and foot: Secondary | ICD-10-CM

## 2017-12-13 DIAGNOSIS — E1142 Type 2 diabetes mellitus with diabetic polyneuropathy: Secondary | ICD-10-CM | POA: Diagnosis not present

## 2017-12-13 DIAGNOSIS — I87323 Chronic venous hypertension (idiopathic) with inflammation of bilateral lower extremity: Secondary | ICD-10-CM | POA: Insufficient documentation

## 2017-12-13 NOTE — Progress Notes (Signed)
Office Visit Note   Patient: [name] [name].           Date of Birth: 1923/05/29           MRN: [id number] Visit Date: 12/13/2017              Requested by: [name], DO [address] [place], [place] [address] PCP: [name], DO  Chief Complaint  Patient presents with  . Right Foot - Follow-up, Pain, Open Wound      HPI: Patient is seen for evaluation for chronic ulcer first metatarsal head right foot.  Patient has had an MRI scan which showed increased edema and tendinitis involving the sesamoids.  Assessment & Plan: Visit Diagnoses:  1. Subacute osteomyelitis of right foot ([place])     Plan: Discussed the importance of knee high compression stockings for the venous insufficiency.  Patient will start wearing the stockings at this time he has not ulcer that probes down to the MTP joint and discussed that his best option would be to proceed with a great toe amputation with partial resection of the first ray.  Would proceed with surgery as an outpatient at [facility] he would ambulate in a wheelchair postoperatively.  Risks and benefits were discussed including the risk of the wound not healing.  Patient states he understands wished to proceed at this time.  Follow-Up Instructions: Return in about 2 weeks (around 12/27/2017).   Ortho Exam  Patient is alert, oriented, no adenopathy, well-dressed, normal affect, normal respiratory effort. Examination patient has a strong dorsalis pedis and posterior tibial pulse.  He has swelling redness and ulcer beneath the first metatarsal head.  This probes 2 cm deep down to the MTP joint.  Patient has brawny skin color changes with pitting edema up to the tibial tubercle with no open wounds.  Imaging: No results found. No images are attached to the encounter.  Labs: Lab Results  Component Value Date   HGBA1C 6.2 (H) 11/18/2017   ESRSEDRATE 35 (H) 11/29/2017   ESRSEDRATE 57 (H) 11/18/2017   CRP 11.4 (H) 11/29/2017   CRP 8.5 (H)  11/18/2017   REPTSTATUS 12/04/2017 FINAL 11/29/2017   REPTSTATUS 12/04/2017 FINAL 11/29/2017   REPTSTATUS 11/30/2017 FINAL 11/29/2017   CULT  11/29/2017    NO GROWTH 5 DAYS Performed at [facility], [place] [address], [place], [place] [address]    CULT  11/29/2017    NO GROWTH 5 DAYS Performed at [facility] [address] [place], [place] [address]    CULT  11/29/2017    NO GROWTH Performed at [facility] [address] [place], [place] [address]     [email or web address] [phone]  Body mass index is 23.57 kg/m.  Orders:  No orders of the defined types were placed in this encounter.  No orders of the defined types were placed in this encounter.    Procedures: No procedures performed  Clinical Data: No additional findings.  ROS:  All other systems negative, except as noted in the HPI. Review of Systems  Objective: Vital Signs: Ht 5\' 11"  (1.803 m)   Wt 169 lb (76.7 kg)   BMI 23.57 kg/m   Specialty Comments:  No specialty comments available.  PMFS History: Patient Active Problem List   Diagnosis Date Noted  . Osteomyelitis ([place]) 11/29/2017  . Thrush 11/29/2017  . Plantar ulcer of right foot ([place])   . Ulcer of right foot with fat layer exposed ([place]) 11/18/2017  .  Cellulitis of right foot 11/18/2017  . Cellulitis of right ankle 11/18/2017  . Sepsis ([place]) 11/18/2017  . Adrenal insufficiency ([place]) 07/01/2017  . Bulging of intervertebral disc between L4 and L5 07/01/2017  . At high risk for bleeding 07/01/2017  . Right buttock pain 02/08/2017  . Leukocytosis 10/12/2016  . HLD (hyperlipidemia) 04/20/2016  . BPH (benign prostatic hyperplasia) 12/08/2015  . CKD (chronic kidney disease) stage 3, GFR 30-59 ml/min (HCC) 11/27/2015  . S/P total knee arthroplasty 07/13/2015  . OA (osteoarthritis) of knee 06/29/2015  . Ectropion 05/06/2015  . Lateral meniscal tear 02/05/2015  . Hyponatremia 10/20/2013  . Weakness 10/19/2013  . Hypertension   .  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 Hypothyroidism (acquired)   . Anemia, B12 deficiency   . Meibomian gland disease 08/06/2013  . Primary open angle glaucoma 06/19/2013  . Arthritis of shoulder region, right 12/15/2011  . Exposure keratitis 08/17/2011  . Dry eye 07/13/2011   Past Medical History:  Diagnosis Date  . Allergic rhinitis    Prior allergy shots 20 years  . Anemia   . Anemia, B12 deficiency 2000  . Arthritis    Status post left total replacement 8 2011  . BPH (benign prostatic hyperplasia) 2013  . Cancer ([place])    renal cell ca and skin cancer   . Colitis 2014  . Difficult intubation 1994   surgery had to be  stopped due to injury to "throat"  . Difficult intubation 1994   no trouble since.  Required nasotracheal intubation  '02 [facility] / ANESTHESIA RECORD FROM 2013 AND 2016 IN EPIC  . Glaucoma 2007   Status post left trabeculectomy 2007  . History of colon polyps    Colonoscopy 2001  . History of kidney stones   . History of renal cell carcinoma 1994   Status post right nephrectomy  . Hyperlipidemia 2003  . Hypertension   . 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 Hypothyroidism (acquired) 2000  . Nocturia   . Pituitary mass ([place]) 2000   S/p transphenoidal excision 05/1999 ([facility])  . Vitamin D deficiency 2009    Family History  Problem Relation Age of Onset  . Lung cancer Father   . Anesthesia problems Neg Hx   . Hypotension Neg Hx   . Malignant hyperthermia Neg Hx   . Pseudochol deficiency Neg Hx     Past Surgical History:  Procedure Laterality Date  . BRAIN SURGERY  2000   pituatary gland removed .  Marland Kitchen CHOLECYSTECTOMY  1984  . Ectropion surgery Bilateral 2006  . EYE SURGERY  over last 6 yrs.     trabeculectomy...   . EYE SURGERY   cat ext ou  . JOINT REPLACEMENT  2011   knee left  . KNEE ARTHROSCOPY Right 02/06/2015   Procedure: RIGHT ARTHROSCOPY KNEE WITH LATERAL MENSICAL   DEBRIDEMENT;  Surgeon: Gaynelle Arabian, MD;  [facility];  Service: Orthopedics;  Laterality: Right;  . Mohs procedure      for skin cancer on nose   . NEPHRECTOMY Right 1994   Renal cell  . REVERSE SHOULDER ARTHROPLASTY  12/15/2011   Procedure: REVERSE SHOULDER ARTHROPLASTY;  Surgeon: Marin Shutter, MD;  Location: [place];  Service: Orthopedics;  Laterality: Right;  right total reverse shoulder  . TONSILLECTOMY    . TOTAL KNEE ARTHROPLASTY Right 06/29/2015   Procedure: TOTAL KNEE ARTHROPLASTY;  Surgeon: Gaynelle Arabian, MD;  [facility];  Service: Orthopedics;  Laterality: Right;  . Transsphenoidal excision pituitary tumor  05/1999   A.Tommi Rumps, M.D.([facility])   Social History   Occupational History  . Not on file  Tobacco Use  . Smoking status: Former Smoker    Types: Pipe    Last attempt to quit: 09/26/1988    Years since quitting: 29.2  . Smokeless tobacco: Never Used  . Tobacco comment: about 20 years  Substance and Sexual Activity  . Alcohol use: Yes    Comment: 1.5 ounces of vodka nightly   . Drug use: No  . Sexual activity: Not Currently

## 2017-12-14 ENCOUNTER — Other Ambulatory Visit (INDEPENDENT_AMBULATORY_CARE_PROVIDER_SITE_OTHER): Payer: Self-pay | Admitting: Orthopedic Surgery

## 2017-12-14 DIAGNOSIS — M869 Osteomyelitis, unspecified: Secondary | ICD-10-CM

## 2017-12-15 ENCOUNTER — Other Ambulatory Visit (HOSPITAL_BASED_OUTPATIENT_CLINIC_OR_DEPARTMENT_OTHER): Payer: Self-pay | Admitting: Internal Medicine

## 2017-12-18 ENCOUNTER — Telehealth (INDEPENDENT_AMBULATORY_CARE_PROVIDER_SITE_OTHER): Payer: Self-pay | Admitting: Orthopedic Surgery

## 2017-12-18 ENCOUNTER — Other Ambulatory Visit: Payer: Self-pay

## 2017-12-18 ENCOUNTER — Encounter (HOSPITAL_COMMUNITY): Payer: Self-pay | Admitting: *Deleted

## 2017-12-18 NOTE — Telephone Encounter (Signed)
Patient called stating that he has surgery scheduled with Dr. Sharol Given Wednesday (4-24) and he is still currently taking antibiotics and he was wondering if he needed to stop taking them and also said he hasn't received a call from the hospital yet for his pre op instructions. CB # [phone]

## 2017-12-18 NOTE — Telephone Encounter (Signed)
I spoke with [name] [name], [name] [name] that the pre-admission nurse would call the evening before surgery and give the pre-op instructions. He is on the schedule for Wednesday and will be having [facility] bring him.

## 2017-12-18 NOTE — Telephone Encounter (Signed)
[name] [name], I called patient and advised.  Sending to you to followup on surgery date/time, and to get him in touch with hospital about any preop instructions.

## 2017-12-18 NOTE — Progress Notes (Signed)
Anesthesia Chart Review:  Pt is a same day work up.    Case:  [id number] Date/Time:  12/20/17 1318   Procedure:  RIGHT 1ST RAY AMPUTATION (Right )   Anesthesia type:  Choice   Pre-op diagnosis:  Osteomyelitis Right Great Toe   Location:  [facility] OR ROOM 06 / [place] [facility]   Surgeon:  [name], MD      DISCUSSION: Pt is a 84 year old male. Hx of panhypopituitarism, adrenal insufficiency. Has hx of difficult intubation. Hospitalized 4/3-12/01/17 for sepsis due to cellulitis.     Anesthesia history:  - Pt had general anesthesia for shoulder arthroplasty 12/15/11; notes in Epic document Endotracheal Tube; Oral; 7.5 mm; Cuffed, Air; 1; Video Laryngoscope; Direct Visualization, ETCO2 (Capnography), Bilateral Breath Sounds; 23 cm.  - By note by Myra Gianotti, PA 12/09/11, he also had a knee arthroscopy under [facility] in December 2009 and a right rotator cuff repair in October 2002 that required nasotracheal intubation.     PROVIDERS: [name], DO   LABS: Labs will be obtained day of surgery (all labs ordered are listed, but only abnormal results are displayed)   IMAGES:  1 view CXR 11/29/17:  - Probable small bilateral pleural effusions. No alveolar pneumonia nor CHF. Mild cardiomegaly. - Thoracic aortic atherosclerosis.  EKG 11/29/17: Sinus rhythm.  RBBB. LAFB.    CV:  Echo 10/13/16:  - Left ventricle: The cavity size was normal. There was moderate concentric hypertrophy. Systolic function was normal. The estimated ejection fraction was in the range of 60% to 65%. Wall motion was normal; there were no regional wall motion abnormalities. Doppler parameters are consistent with abnormal left ventricular relaxation (grade 1 diastolic dysfunction). - Aortic valve: There was trivial regurgitation.   Past Medical History:  Diagnosis Date  . Allergic rhinitis    Prior allergy shots 20 years  . Anemia   . Anemia, B12 deficiency 2000  . Arthritis    Status post left total replacement 8 2011  .  BPH (benign prostatic hyperplasia) 2013  . Cancer ([place])    renal cell ca and skin cancer   . Colitis 2014  . Difficult intubation 1994   surgery had to be  stopped due to injury to "throat"  . Difficult intubation 1994   no trouble since.  Required nasotracheal intubation  '02 [facility] / ANESTHESIA RECORD FROM 2013 AND 2016 IN EPIC  . Glaucoma 2007   Status post left trabeculectomy 2007  . History of colon polyps    Colonoscopy 2001  . History of kidney stones   . History of renal cell carcinoma 1994   Status post right nephrectomy  . Hyperlipidemia 2003  . Hypertension   . 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 Hypothyroidism (acquired) 2000  . Nocturia   . Pituitary mass ([place]) 2000   S/p transphenoidal excision 05/1999 ([facility])  . Vitamin D deficiency 2009   - Hospitalized 4/3-12/01/17 for sepsis due to cellulitis.  - Hospitalized 3/22-3/24/19 for cellulitis, sepsis   Past Surgical History:  Procedure Laterality Date  . BRAIN SURGERY  2000   pituatary gland removed .  Marland Kitchen CHOLECYSTECTOMY  1984  . Ectropion surgery Bilateral 2006  . EYE SURGERY  over last 6 yrs.     trabeculectomy...   . EYE SURGERY   cat ext ou  . JOINT REPLACEMENT  2011   knee left  . KNEE ARTHROSCOPY Right 02/06/2015   Procedure: RIGHT ARTHROSCOPY  KNEE WITH LATERAL MENSICAL  DEBRIDEMENT;  Surgeon: Gaynelle Arabian, MD;  [facility];  Service: Orthopedics;  Laterality: Right;  . Mohs procedure      for skin cancer on nose   . NEPHRECTOMY Right 1994   Renal cell  . REVERSE SHOULDER ARTHROPLASTY  12/15/2011   Procedure: REVERSE SHOULDER ARTHROPLASTY;  Surgeon: Marin Shutter, MD;  Location: [place];  Service: Orthopedics;  Laterality: Right;  right total reverse shoulder  . TONSILLECTOMY    . TOTAL KNEE ARTHROPLASTY Right 06/29/2015   Procedure: TOTAL KNEE ARTHROPLASTY;  Surgeon: Gaynelle Arabian, MD;  [facility];  Service: Orthopedics;  Laterality:  Right;  . Transsphenoidal excision pituitary tumor  05/1999   A.Tommi Rumps, M.D.([facility])    MEDICATIONS: No current facility-administered medications for this encounter.    Marland Kitchen acetaminophen (TYLENOL) 500 MG tablet  . Cholecalciferol (VITAMIN D) 2000 units tablet  . Coenzyme Q10 300 MG CAPS  . docusate sodium (COLACE) 100 MG capsule  . doxycycline (VIBRA-TABS) 100 MG tablet  . finasteride (PROSCAR) 5 MG tablet  . Flaxseed, Linseed, (FLAX SEEDS PO)  . lactobacillus acidophilus & bulgar (LACTINEX) chewable tablet  . Lidocaine 4 % PTCH  . Multiple Vitamins-Minerals (MULTIVITAMIN & MINERAL PO)  . Omega-3 Fatty Acids (OMEGA 3 PO)  . pantoprazole (PROTONIX) 40 MG tablet  . Polyethyl Glycol-Propyl Glycol (SYSTANE OP)  . pravastatin (PRAVACHOL) 80 MG tablet  . predniSONE (DELTASONE) 5 MG tablet  . SYNTHROID 88 MCG tablet  . testosterone cypionate (DEPOTESTOSTERONE CYPIONATE) 200 MG/ML injection  . vitamin B-12 (CYANOCOBALAMIN) 500 MCG tablet  . omega-3 acid ethyl esters (LOVAZA) 1 g capsule    If labs acceptable day of surgery, I anticipate pt can proceed with surgery as scheduled.   Willeen Cass, FNP-BC [facility]/Anesthesiology Phone: [phone] 12/18/2017 4:18 PM

## 2017-12-18 NOTE — Progress Notes (Signed)
Pt denies SOB, chest pain, and being under the care of a cardiologist. Pt denies having a cardiac cath but stated that a stress test was performed > 10 years ago. Pt made aware to stop taking vitamins, fish oil, CO Q 10, Flaxseed and herbal medications. Pt verbalized understanding of all pre-op instructions. See Anesthesia note (pt difficult intubation).

## 2017-12-18 NOTE — Telephone Encounter (Signed)
Please advise on abx

## 2017-12-18 NOTE — Telephone Encounter (Signed)
Call patient and have him continue with the antibiotics.  Check With Malachy Mood to see if surgery is scheduled for Wednesday.

## 2017-12-20 ENCOUNTER — Ambulatory Visit (HOSPITAL_COMMUNITY): Payer: PPO | Admitting: Emergency Medicine

## 2017-12-20 ENCOUNTER — Other Ambulatory Visit: Payer: Self-pay

## 2017-12-20 ENCOUNTER — Ambulatory Visit (HOSPITAL_COMMUNITY)
Admission: RE | Admit: 2017-12-20 | Discharge: 2017-12-20 | Disposition: A | Discharge status: Home / Self Care | Payer: PPO | Source: Ambulatory Visit | Attending: Orthopedic Surgery | Admitting: Orthopedic Surgery

## 2017-12-20 ENCOUNTER — Encounter (HOSPITAL_COMMUNITY): Payer: Self-pay | Admitting: *Deleted

## 2017-12-20 ENCOUNTER — Encounter (HOSPITAL_COMMUNITY)
Admission: RE | Disposition: A | Discharge status: Home / Self Care | Payer: Self-pay | Source: Ambulatory Visit | Attending: Orthopedic Surgery

## 2017-12-20 DIAGNOSIS — N400 Enlarged prostate without lower urinary tract symptoms: Secondary | ICD-10-CM | POA: Diagnosis not present

## 2017-12-20 DIAGNOSIS — Z87891 Personal history of nicotine dependence: Secondary | ICD-10-CM | POA: Insufficient documentation

## 2017-12-20 DIAGNOSIS — M869 Osteomyelitis, unspecified: Secondary | ICD-10-CM | POA: Diagnosis not present

## 2017-12-20 DIAGNOSIS — M86171 Other acute osteomyelitis, right ankle and foot: Secondary | ICD-10-CM | POA: Diagnosis not present

## 2017-12-20 DIAGNOSIS — Z96653 Presence of artificial knee joint, bilateral: Secondary | ICD-10-CM | POA: Insufficient documentation

## 2017-12-20 DIAGNOSIS — E785 Hyperlipidemia, unspecified: Secondary | ICD-10-CM | POA: Diagnosis not present

## 2017-12-20 DIAGNOSIS — I452 Bifascicular block: Secondary | ICD-10-CM | POA: Diagnosis not present

## 2017-12-20 DIAGNOSIS — Z85528 Personal history of other malignant neoplasm of kidney: Secondary | ICD-10-CM | POA: Diagnosis not present

## 2017-12-20 DIAGNOSIS — E538 Deficiency of other specified B group vitamins: Secondary | ICD-10-CM | POA: Diagnosis not present

## 2017-12-20 DIAGNOSIS — E559 Vitamin D deficiency, unspecified: Secondary | ICD-10-CM | POA: Diagnosis not present

## 2017-12-20 DIAGNOSIS — L97519 Non-pressure chronic ulcer of other part of right foot with unspecified severity: Secondary | ICD-10-CM | POA: Insufficient documentation

## 2017-12-20 DIAGNOSIS — Z888 Allergy status to other drugs, medicaments and biological substances status: Secondary | ICD-10-CM | POA: Insufficient documentation

## 2017-12-20 DIAGNOSIS — I10 Essential (primary) hypertension: Secondary | ICD-10-CM | POA: Diagnosis not present

## 2017-12-20 DIAGNOSIS — Z85828 Personal history of other malignant neoplasm of skin: Secondary | ICD-10-CM | POA: Diagnosis not present

## 2017-12-20 DIAGNOSIS — K219 Gastro-esophageal reflux disease without esophagitis: Secondary | ICD-10-CM | POA: Diagnosis not present

## 2017-12-20 DIAGNOSIS — Z86010 Personal history of colonic polyps: Secondary | ICD-10-CM | POA: Insufficient documentation

## 2017-12-20 DIAGNOSIS — E039 Hypothyroidism, unspecified: Secondary | ICD-10-CM | POA: Insufficient documentation

## 2017-12-20 DIAGNOSIS — N183 Chronic kidney disease, stage 3 (moderate): Secondary | ICD-10-CM | POA: Diagnosis not present

## 2017-12-20 DIAGNOSIS — I129 Hypertensive chronic kidney disease with stage 1 through stage 4 chronic kidney disease, or unspecified chronic kidney disease: Secondary | ICD-10-CM | POA: Diagnosis not present

## 2017-12-20 DIAGNOSIS — G8918 Other acute postprocedural pain: Secondary | ICD-10-CM | POA: Diagnosis not present

## 2017-12-20 HISTORY — PX: AMPUTATION: SHX166

## 2017-12-20 HISTORY — DX: Osteomyelitis, unspecified: M86.9

## 2017-12-20 LAB — BASIC METABOLIC PANEL
Anion gap: 9 (ref 5–15)
BUN: 24 mg/dL — AB (ref 6–20)
CALCIUM: 9.7 mg/dL (ref 8.9–10.3)
CHLORIDE: 101 mmol/L (ref 101–111)
CO2: 26 mmol/L (ref 22–32)
CREATININE: 1.29 mg/dL — AB (ref 0.61–1.24)
GFR calc non Af Amer: 46 mL/min — ABNORMAL LOW (ref >60)
GFR, EST AFRICAN AMERICAN: 54 mL/min — AB (ref >60)
Glucose, Bld: 116 mg/dL — ABNORMAL HIGH (ref 65–99)
Potassium: 4 mmol/L (ref 3.5–5.1)
SODIUM: 136 mmol/L (ref 135–145)

## 2017-12-20 LAB — CBC
HEMATOCRIT: 39.2 % (ref 39.0–52.0)
HEMOGLOBIN: 12.7 g/dL — AB (ref 13.0–17.0)
MCH: 31.9 pg (ref 26.0–34.0)
MCHC: 32.4 g/dL (ref 30.0–36.0)
MCV: 98.5 fL (ref 78.0–100.0)
Platelets: 285 10*3/uL (ref 150–400)
RBC: 3.98 MIL/uL — AB (ref 4.22–5.81)
RDW: 16.1 % — ABNORMAL HIGH (ref 11.5–15.5)
WBC: 16 10*3/uL — AB (ref 4.0–10.5)

## 2017-12-20 SURGERY — AMPUTATION, FOOT, RAY
Anesthesia: Regional | Laterality: Right

## 2017-12-20 MED ORDER — ONDANSETRON HCL 4 MG/2ML IJ SOLN
INTRAMUSCULAR | Status: AC
  Filled 2017-12-20: qty 4

## 2017-12-20 MED ORDER — FENTANYL CITRATE (PF) 100 MCG/2ML IJ SOLN
INTRAMUSCULAR | Status: DC | PRN
  Administered 2017-12-20: 50 ug via INTRAVENOUS

## 2017-12-20 MED ORDER — MIDAZOLAM HCL 2 MG/2ML IJ SOLN
0.5000 mg | Freq: Once | INTRAMUSCULAR | Status: AC
  Administered 2017-12-20: 0.5 mg via INTRAVENOUS

## 2017-12-20 MED ORDER — PROPOFOL 10 MG/ML IV BOLUS
INTRAVENOUS | Status: AC
  Filled 2017-12-20: qty 20

## 2017-12-20 MED ORDER — CHLORHEXIDINE GLUCONATE 4 % EX LIQD: 60.0000 mL | Freq: Once | CUTANEOUS | Status: DC

## 2017-12-20 MED ORDER — CEFAZOLIN SODIUM-DEXTROSE 2-4 GM/100ML-% IV SOLN
2.0000 g | INTRAVENOUS | Status: AC
  Administered 2017-12-20: 2 g via INTRAVENOUS
  Filled 2017-12-20: qty 100

## 2017-12-20 MED ORDER — LIDOCAINE HCL 1 % IJ SOLN
INTRAMUSCULAR | Status: AC
  Filled 2017-12-20: qty 20

## 2017-12-20 MED ORDER — ONDANSETRON HCL 4 MG/2ML IJ SOLN
INTRAMUSCULAR | Status: DC | PRN
  Administered 2017-12-20: 4 mg via INTRAVENOUS

## 2017-12-20 MED ORDER — MIDAZOLAM HCL 2 MG/2ML IJ SOLN
INTRAMUSCULAR | Status: AC
  Administered 2017-12-20: 0.5 mg via INTRAVENOUS
  Filled 2017-12-20: qty 2

## 2017-12-20 MED ORDER — PROPOFOL 500 MG/50ML IV EMUL
INTRAVENOUS | Status: DC | PRN
  Administered 2017-12-20: 50 ug/kg/min via INTRAVENOUS

## 2017-12-20 MED ORDER — DEXAMETHASONE SODIUM PHOSPHATE 10 MG/ML IJ SOLN
INTRAMUSCULAR | Status: DC | PRN
  Administered 2017-12-20: 4 mg via INTRAVENOUS

## 2017-12-20 MED ORDER — PHENYLEPHRINE 40 MCG/ML (10ML) SYRINGE FOR IV PUSH (FOR BLOOD PRESSURE SUPPORT)
PREFILLED_SYRINGE | INTRAVENOUS | Status: DC | PRN
  Administered 2017-12-20: 80 ug via INTRAVENOUS

## 2017-12-20 MED ORDER — EPHEDRINE SULFATE-NACL 50-0.9 MG/10ML-% IV SOSY
PREFILLED_SYRINGE | INTRAVENOUS | Status: DC | PRN
  Administered 2017-12-20: 10 mg via INTRAVENOUS

## 2017-12-20 MED ORDER — DEXAMETHASONE SODIUM PHOSPHATE 10 MG/ML IJ SOLN
INTRAMUSCULAR | Status: AC
  Filled 2017-12-20: qty 1

## 2017-12-20 MED ORDER — PROPOFOL 10 MG/ML IV BOLUS
INTRAVENOUS | Status: DC | PRN
  Administered 2017-12-20: 20 mg via INTRAVENOUS

## 2017-12-20 MED ORDER — 0.9 % SODIUM CHLORIDE (POUR BTL) OPTIME
TOPICAL | Status: DC | PRN
  Administered 2017-12-20: 1000 mL

## 2017-12-20 MED ORDER — LIDOCAINE HCL (PF) 1 % IJ SOLN
INTRAMUSCULAR | Status: DC | PRN
  Administered 2017-12-20: 17 mL

## 2017-12-20 MED ORDER — FENTANYL CITRATE (PF) 250 MCG/5ML IJ SOLN
INTRAMUSCULAR | Status: AC
  Filled 2017-12-20: qty 5

## 2017-12-20 MED ORDER — LACTATED RINGERS IV SOLN
INTRAVENOUS | Status: DC
  Administered 2017-12-20: 13:00:00 via INTRAVENOUS

## 2017-12-20 MED ORDER — FENTANYL CITRATE (PF) 100 MCG/2ML IJ SOLN
INTRAMUSCULAR | Status: AC
  Administered 2017-12-20: 50 ug via INTRAVENOUS
  Filled 2017-12-20: qty 2

## 2017-12-20 MED ORDER — LIDOCAINE 2% (20 MG/ML) 5 ML SYRINGE
INTRAMUSCULAR | Status: AC
  Filled 2017-12-20: qty 5

## 2017-12-20 MED ORDER — FENTANYL CITRATE (PF) 100 MCG/2ML IJ SOLN
50.0000 ug | Freq: Once | INTRAMUSCULAR | Status: AC
  Administered 2017-12-20: 50 ug via INTRAVENOUS

## 2017-12-20 MED ORDER — HYDROCODONE-ACETAMINOPHEN 5-325 MG PO TABS: 1.0000 | ORAL_TABLET | ORAL | 0 refills | Status: DC | PRN

## 2017-12-20 SURGICAL SUPPLY — 33 items
BLADE SAW SGTL MED 73X18.5 STR (BLADE) IMPLANT
BLADE SURG 21 STRL SS (BLADE) ×3 IMPLANT
BNDG COHESIVE 4X5 TAN STRL (GAUZE/BANDAGES/DRESSINGS) ×3 IMPLANT
BNDG GAUZE ELAST 4 BULKY (GAUZE/BANDAGES/DRESSINGS) ×3 IMPLANT
COVER SURGICAL LIGHT HANDLE (MISCELLANEOUS) ×6 IMPLANT
DRAPE U-SHAPE 47X51 STRL (DRAPES) ×6 IMPLANT
DRSG ADAPTIC 3X8 NADH LF (GAUZE/BANDAGES/DRESSINGS) ×3 IMPLANT
DRSG PAD ABDOMINAL 8X10 ST (GAUZE/BANDAGES/DRESSINGS) ×6 IMPLANT
DURAPREP 26ML APPLICATOR (WOUND CARE) ×3 IMPLANT
ELECT REM PT RETURN 9FT ADLT (ELECTROSURGICAL) ×3
ELECTRODE REM PT RTRN 9FT ADLT (ELECTROSURGICAL) ×1 IMPLANT
GAUZE SPONGE 4X4 12PLY STRL (GAUZE/BANDAGES/DRESSINGS) ×3 IMPLANT
GAUZE SPONGE 4X4 12PLY STRL LF (GAUZE/BANDAGES/DRESSINGS) ×2 IMPLANT
GLOVE BIOGEL PI IND STRL 9 (GLOVE) ×1 IMPLANT
GLOVE BIOGEL PI INDICATOR 9 (GLOVE) ×2
GLOVE SURG ORTHO 9.0 STRL STRW (GLOVE) ×3 IMPLANT
GOWN STRL REUS W/ TWL XL LVL3 (GOWN DISPOSABLE) ×2 IMPLANT
GOWN STRL REUS W/TWL XL LVL3 (GOWN DISPOSABLE) ×6
KIT BASIN OR (CUSTOM PROCEDURE TRAY) ×3 IMPLANT
KIT TURNOVER KIT B (KITS) ×3 IMPLANT
NEEDLE 22X1 1/2 (OR ONLY) (NEEDLE) ×2 IMPLANT
NS IRRIG 1000ML POUR BTL (IV SOLUTION) ×3 IMPLANT
PACK ORTHO EXTREMITY (CUSTOM PROCEDURE TRAY) ×3 IMPLANT
PAD ABD 8X10 STRL (GAUZE/BANDAGES/DRESSINGS) ×2 IMPLANT
PAD ARMBOARD 7.5X6 YLW CONV (MISCELLANEOUS) ×6 IMPLANT
SPONGE LAP 18X18 5 PK (GAUZE/BANDAGES/DRESSINGS) ×2 IMPLANT
STOCKINETTE IMPERVIOUS LG (DRAPES) IMPLANT
SUT ETHILON 2 0 PSLX (SUTURE) ×5 IMPLANT
SYR CONTROL 10ML LL (SYRINGE) ×2 IMPLANT
TOWEL OR 17X26 10 PK STRL BLUE (TOWEL DISPOSABLE) ×3 IMPLANT
TUBE CONNECTING 12'X1/4 (SUCTIONS) ×1
TUBE CONNECTING 12X1/4 (SUCTIONS) ×2 IMPLANT
YANKAUER SUCT BULB TIP NO VENT (SUCTIONS) ×3 IMPLANT

## 2017-12-20 NOTE — Anesthesia Postprocedure Evaluation (Signed)
Anesthesia Post Note  Patient: [name] [name].  Procedure(s) Performed: RIGHT 1ST RAY AMPUTATION (Right )     Patient location during evaluation: PACU Anesthesia Type: Regional Level of consciousness: awake and alert Pain management: pain level controlled Vital Signs Assessment: post-procedure vital signs reviewed and stable Respiratory status: spontaneous breathing, nonlabored ventilation, respiratory function stable and patient connected to nasal cannula oxygen Cardiovascular status: stable and blood pressure returned to baseline Postop Assessment: no apparent nausea or vomiting Anesthetic complications: no    Last Vitals:  Vitals:   12/20/17 1643 12/20/17 1713  BP: (!) 143/68 132/67  Pulse: 81 80  Resp: 16 18  Temp: (!) 36.3 C   SpO2: 98% 100%    Last Pain:  Vitals:   12/20/17 1713  TempSrc:   PainSc: 0-No pain                 Danyka Merlin COKER

## 2017-12-20 NOTE — Transfer of Care (Signed)
Immediate Anesthesia Transfer of Care Note  Patient: [name] [name].  Procedure(s) Performed: RIGHT 1ST RAY AMPUTATION (Right )  Patient Location: PACU  Anesthesia Type:MAC and MAC combined with regional for post-op pain  Level of Consciousness: awake, alert , oriented and patient cooperative  Airway & Oxygen Therapy: Patient Spontanous Breathing  Post-op Assessment: Report given to RN, Post -op Vital signs reviewed and stable and Patient moving all extremities X 4  Post vital signs: Reviewed and stable  Last Vitals:  Vitals Value Taken Time  BP 153/69 12/20/2017  3:59 PM  Temp    Pulse 78 12/20/2017  4:00 PM  Resp 20 12/20/2017  4:00 PM  SpO2 98 % 12/20/2017  4:00 PM  Vitals shown include unvalidated device data.  Last Pain:  Vitals:   12/20/17 1230  TempSrc:   PainSc: 0-No pain      Patients Stated Pain Goal: 3 (10/10/15 3567)  Complications: No apparent anesthesia complications

## 2017-12-20 NOTE — H&P (Signed)
[name] [name]. is an 84 y.o. male.   Chief Complaint: Chronic ulceration and osteomyelitis right great toe and first metatarsal head. HPI: Patient has  chronic ulcer first metatarsal head right foot.  Patient has had an MRI scan which showed increased edema and tendinitis involving the sesamoids.    Past Medical History:  Diagnosis Date  . Allergic rhinitis    Prior allergy shots 20 years  . Anemia   . Anemia, B12 deficiency 2000  . Arthritis    Status post left total replacement 8 2011  . BPH (benign prostatic hyperplasia) 2013  . Cancer ([place])    renal cell ca and skin cancer   . Colitis 2014  . Difficult intubation 1994   surgery had to be  stopped due to injury to "throat"  . Difficult intubation 1994   no trouble since.  Required nasotracheal intubation  '02 [facility] / ANESTHESIA RECORD FROM 2013 AND 2016 IN EPIC  . GERD (gastroesophageal reflux disease)   . Glaucoma 2007   Status post left trabeculectomy 2007  . History of colon polyps    Colonoscopy 2001  . History of kidney stones   . History of renal cell carcinoma 1994   Status post right nephrectomy  . Hyperlipidemia 2003  . Hypertension   . 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 Hypothyroidism (acquired) 2000  . Nocturia   . Osteomyelitis ([place])    right great  . Pituitary mass ([place]) 2000   S/p transphenoidal excision 05/1999 ([facility])  . Vitamin D deficiency 2009    Past Surgical History:  Procedure Laterality Date  . BRAIN SURGERY  2000   pituatary gland removed .  Marland Kitchen CHOLECYSTECTOMY  1984  . Ectropion surgery Bilateral 2006  . EYE SURGERY  over last 6 yrs.     trabeculectomy...   . EYE SURGERY   cat ext ou  . JOINT REPLACEMENT  2011   knee left  . KNEE ARTHROSCOPY Right 02/06/2015   Procedure: RIGHT ARTHROSCOPY KNEE WITH LATERAL MENSICAL  DEBRIDEMENT;  Surgeon: Gaynelle Arabian, MD;  Location: [facility];  Service: Orthopedics;  Laterality: Right;  . Mohs  procedure      for skin cancer on nose   . NEPHRECTOMY Right 1994   Renal cell  . REVERSE SHOULDER ARTHROPLASTY  12/15/2011   Procedure: REVERSE SHOULDER ARTHROPLASTY;  Surgeon: Marin Shutter, MD;  Location: [place];  Service: Orthopedics;  Laterality: Right;  right total reverse shoulder  . TONSILLECTOMY    . TOTAL KNEE ARTHROPLASTY Right 06/29/2015   Procedure: TOTAL KNEE ARTHROPLASTY;  Surgeon: Gaynelle Arabian, MD;  Location: [facility];  Service: Orthopedics;  Laterality: Right;  . Transsphenoidal excision pituitary tumor  05/1999   A.Tommi Rumps, M.D.([facility])    Family History  Problem Relation Age of Onset  . Lung cancer Father   . Anesthesia problems Neg Hx   . Hypotension Neg Hx   . Malignant hyperthermia Neg Hx   . Pseudochol deficiency Neg Hx    Social History:  reports that he quit smoking about 29 years ago. His smoking use included pipe. He has never used smokeless tobacco. He reports that he drinks alcohol. He reports that he does not use drugs.  Allergies:  Allergies  Allergen Reactions  . Adhesive [Tape] Other (See Comments)    CAUSES SKIN TEARS, prefers paper tape   . Robaxin [Methocarbamol]     UNSPECIFIED REACTION   . Diovan [Valsartan] Rash  No medications prior to admission.    No results found for this or any previous visit (from the past 48 hour(s)). No results found.  Review of Systems  All other systems reviewed and are negative.   There were no vitals taken for this visit. Physical Exam  Patient is alert, oriented, no adenopathy, well-dressed, normal affect, normal respiratory effort. Examination patient has a strong dorsalis pedis and posterior tibial pulse.  He has swelling redness and ulcer beneath the first metatarsal head.  This probes 2 cm deep down to the MTP joint.  Patient has brawny skin color changes with pitting edema up to the tibial tubercle with no open wounds.   Assessment/Plan 1. Subacute osteomyelitis of right foot ([place])      Plan: Discussed the importance of knee high compression stockings for the venous insufficiency.  Patient will start wearing the stockings at this time he has not ulcer that probes down to the MTP joint and discussed that his best option would be to proceed with a great toe amputation with partial resection of the first ray.  Would proceed with surgery as an outpatient at [facility] he would ambulate in a wheelchair postoperatively.  Risks and benefits were discussed including the risk of the wound not healing.  Patient states he understands wished to proceed at this time.     [name], MD 12/20/2017, 6:55 AM

## 2017-12-20 NOTE — Anesthesia Procedure Notes (Addendum)
Anesthesia Regional Block: Ankle block   Pre-Anesthetic Checklist: ,, timeout performed, Correct Patient, Correct Site, Correct Laterality, Correct Procedure, Correct Position, site marked, Risks and benefits discussed, Surgical consent,  Pre-op evaluation,  At surgeon's request  Laterality: Right  Prep: chloraprep       Needles:  Injection technique: Single-shot  Needle Type: Other      Needle Gauge: 22     Additional Needles:   Narrative:  Start time: 12/20/2017 2:20 PM End time: 12/20/2017 2:25 PM Injection made incrementally with aspirations every 5 mL.  Performed by: Personally   Additional Notes: 30 cc 2% Lidocaine plain injected easily

## 2017-12-20 NOTE — Progress Notes (Signed)
Called report to Rehab RN at [facility], [phone].  Also gave DC instructions to patient's wife, she has a copy of DC papers and prescription.

## 2017-12-20 NOTE — Discharge Instructions (Signed)

## 2017-12-20 NOTE — Op Note (Signed)
12/20/2017  3:49 PM  PATIENT:  [name] [name].    PRE-OPERATIVE DIAGNOSIS:  Osteomyelitis Right Great Toe  POST-OPERATIVE DIAGNOSIS:  Same  PROCEDURE:  RIGHT 1ST RAY AMPUTATION Local tissue rearrangement for wound closure 9 x 3 cm.  SURGEON:  [name], MD  PHYSICIAN ASSISTANT:None ANESTHESIA:   General  PREOPERATIVE INDICATIONS:  [name]. is a  84 y.o. male with a diagnosis of Osteomyelitis Right Great Toe who failed conservative measures and elected for surgical management.    The risks benefits and alternatives were discussed with the patient preoperatively including but not limited to the risks of infection, bleeding, nerve injury, cardiopulmonary complications, the need for revision surgery, among others, and the patient was willing to proceed.  OPERATIVE IMPLANTS: None  @ENCIMAGES @  OPERATIVE FINDINGS: No abscess at the amputation site  OPERATIVE PROCEDURE: Patient was brought the operating room and underwent an ankle block.  After adequate levels of anesthesia were obtained patient's right lower extremity was prepped using DuraPrep draped in the sterile field a timeout was called.  A racquet incision was made around the ulcerative tissue in the toe.  This left a wound that was 9 x 3 cm.  The first metatarsal was resected and an angle through the midshaft.  The toe was resected with the ulcerative tissue in one block of tissue.  There is no signs of abscess or infection at the amputation site.  Electrocautery was used for hemostasis.  Local tissue rearrangement was used to close the wound that was 9 x 3 cm.  A compression dressing was applied patient was taken to PACU in stable condition.   DISCHARGE PLANNING:  Antibiotic duration: Perioperative  Weightbearing: Nonweightbearing on the right  Pain medication: Prescription for Vicodin  Dressing care/ Wound VAC: Follow-up in 1 week to change the dressing  Ambulatory devices: Walker for  transfers  Discharge to: [facility]  Follow-up: In the office 1 week post operative.

## 2017-12-20 NOTE — Anesthesia Preprocedure Evaluation (Addendum)
Anesthesia Evaluation  Patient identified by MRN, date of birth, ID band Patient awake    Reviewed: Allergy & Precautions, NPO status , Patient's Chart, lab work & pertinent test results  Airway Mallampati: III  TM Distance: >3 FB   Mouth opening: Limited Mouth Opening  Dental  (+) Teeth Intact   Pulmonary former smoker,    breath sounds clear to auscultation       Cardiovascular hypertension,  Rhythm:Regular Rate:Normal     Neuro/Psych    GI/Hepatic   Endo/Other    Renal/GU      Musculoskeletal   Abdominal   Peds  Hematology   Anesthesia Other Findings   Reproductive/Obstetrics                            Anesthesia Physical Anesthesia Plan  ASA: III  Anesthesia Plan: Regional   Post-op Pain Management:    Induction:   PONV Risk Score and Plan: Ondansetron  Airway Management Planned: Simple Face Mask and Natural Airway  Additional Equipment:   Intra-op Plan:   Post-operative Plan:   Informed Consent: I have reviewed the patients History and Physical, chart, labs and discussed the procedure including the risks, benefits and alternatives for the proposed anesthesia with the patient or authorized representative who has indicated his/her understanding and acceptance.   Dental advisory given  Plan Discussed with: CRNA and Anesthesiologist  Anesthesia Plan Comments: (Plan Ankle Block with MAC)       Anesthesia Quick Evaluation

## 2017-12-21 ENCOUNTER — Encounter (HOSPITAL_COMMUNITY): Payer: Self-pay | Admitting: Orthopedic Surgery

## 2017-12-21 ENCOUNTER — Other Ambulatory Visit (INDEPENDENT_AMBULATORY_CARE_PROVIDER_SITE_OTHER): Payer: Self-pay

## 2017-12-21 ENCOUNTER — Telehealth (INDEPENDENT_AMBULATORY_CARE_PROVIDER_SITE_OTHER): Payer: Self-pay | Admitting: Orthopedic Surgery

## 2017-12-21 NOTE — Telephone Encounter (Signed)
Erin-nurse with [facility] called needing verbal orders for wound care. Erin asked if patient need antibiotic and does patient need to wear post op shoe all the time even in the bed at night. The number to contact Junie Panning is [phone]  Fax# is [phone]

## 2017-12-21 NOTE — Telephone Encounter (Signed)
Pt is to leave surgical dressing intact and follow up with me next Wednesday.

## 2017-12-21 NOTE — Telephone Encounter (Signed)
Erin nurse with [facility] called needing clarification on WD vac orders. Also need PT and OT orders. Does Dr Sharol Given want patient on the Doxycycline.> The number is [phone]

## 2017-12-21 NOTE — Telephone Encounter (Signed)
Order faxed to facility to leave the wound vac on. Will remove next week at appt and give updated orders at that time. Pt does not need to sleep with the post op shoe. To call with questions.

## 2017-12-21 NOTE — Telephone Encounter (Signed)
Patient called to make his fu appointment after surgery and wanted to know if his dressings needed to be change at [facility] before coming to his appointment.  CB#[phone].  Thank you.

## 2017-12-22 NOTE — Telephone Encounter (Signed)
I called and sw erin and she states that the pt did not come with a vac so I advised to leave the surgical dressing intact unless it becomes soiled. If there is drainage that comes through the dressing then they can remove and apply a dry dressing cahnge daily. The pt is non weight bearing on the right and will use a walker for ambulation so PT and OT can eval and treat with that restriction and the pt does not have to continue with the doxy he was taking prior to surgery. Will update all orders after first post op appt and Junie Panning will call with any questions.

## 2017-12-26 ENCOUNTER — Non-Acute Institutional Stay (SKILLED_NURSING_FACILITY): Payer: PPO | Admitting: Internal Medicine

## 2017-12-26 ENCOUNTER — Encounter: Payer: Self-pay | Admitting: Internal Medicine

## 2017-12-26 DIAGNOSIS — N183 Chronic kidney disease, stage 3 unspecified: Secondary | ICD-10-CM

## 2017-12-26 DIAGNOSIS — R531 Weakness: Secondary | ICD-10-CM

## 2017-12-26 DIAGNOSIS — N401 Enlarged prostate with lower urinary tract symptoms: Secondary | ICD-10-CM

## 2017-12-26 DIAGNOSIS — E2740 Unspecified adrenocortical insufficiency: Secondary | ICD-10-CM

## 2017-12-26 DIAGNOSIS — M869 Osteomyelitis, unspecified: Secondary | ICD-10-CM | POA: Diagnosis not present

## 2017-12-26 DIAGNOSIS — R350 Frequency of micturition: Secondary | ICD-10-CM

## 2017-12-26 DIAGNOSIS — E893 Postprocedural hypopituitarism: Secondary | ICD-10-CM | POA: Diagnosis not present

## 2017-12-26 DIAGNOSIS — Z89411 Acquired absence of right great toe: Secondary | ICD-10-CM

## 2017-12-26 NOTE — Progress Notes (Signed)
Patient ID: [name] Bergquist., male   DOB: 11-02-1922, 84 y.o.   MRN: [id number]  Provider:  Rexene Edison. Mariea Clonts, D.O., C.M.D. Location:  [facility] Room Number: [place] of Service:  SNF (31)  PCP: [name], DO Patient Care Team: [name], DO as PCP - General (Geriatric Medicine) Community, [name], [name], MD as Referring Physician (Endocrinology)  Extended Emergency Contact Information Primary Emergency Contact: Albino,[name] Address: [address]          [place], [place] [address] [name] [name] Phone: [phone] Mobile Phone: [phone] Relation: Son Secondary Emergency Contact: [name] [name] , [name] [name] Address: [address]          [place], [place] [address] [name] Litter of [place] Phone: [phone] Relation: Son  Code Status: DNR Goals of Care: Advanced Directive information Advanced Directives 12/26/2017  Does Patient Have a Medical Advance Directive? Yes  Type of Advance Directive Out of facility DNR (pink MOST or yellow form);Healthcare Power of Attorney  Does patient want to make changes to medical advance directive? No - Patient declined  Copy of [place] in Chart? Yes  Would patient like information on creating a medical advance directive? -  Pre-existing out of facility DNR order (yellow form or pink MOST form) Yellow form placed in chart (order not valid for inpatient use)      Chief Complaint  Patient presents with  . New Admit To SNF    Rehab admission    HPI: Patient is a 84 y.o. male with h/o panhypopituitarism s/p adenoma resection, adrenal insufficiency on chronic steroids, gait instability, BPH, CKD3 and OA seen today for admission to [facility] rehab 12/20/17 after a hospitalization for right first ray amputation due to osteomyelitis noted on MRI of that toe.  He'd been having recurrent cellulitis of the right lower extremity  and a nonhealing wound on the right great toe.  He'd been followed at the wound care center after being seen by a podiatrist.  After consultation with them as well as infectious disease, it was decided that the best course was indeed first ray amputation.  Dr. Sharol Given performed this surgery on 4/24.  Pt was discharged with prn hydrocodone and tylenol for pain.  He has not used the hydrocodone.  He is nonweightbearing on the right leg and is to f/u tomorrow for a dressing change.  He is using a walker for transfers and able to work with PT, OT, but limited by the nonweightbearing of the right leg.  He's been elevating his feet at rest and working on exercises in the recliner.  He's also reading and doing various things on his ipad.  He was not treated with any additional doxycycline after the surgery.  He reports no pain in the foot at present.    Past Medical History:  Diagnosis Date  . Allergic rhinitis    Prior allergy shots 20 years  . Anemia   . Anemia, B12 deficiency 2000  . Arthritis    Status post left total replacement 8 2011  . BPH (benign prostatic hyperplasia) 2013  . Cancer ([place])    renal cell ca and skin cancer   . Colitis 2014  . Difficult intubation 1994   surgery had to be  stopped due to injury to "throat"  . Difficult intubation 1994   no trouble since.  Required nasotracheal intubation  '02 [facility] / ANESTHESIA RECORD FROM 2013 AND 2016 IN  EPIC  . GERD (gastroesophageal reflux disease)   . Glaucoma 2007   Status post left trabeculectomy 2007  . History of colon polyps    Colonoscopy 2001  . History of kidney stones   . History of renal cell carcinoma 1994   Status post right nephrectomy  . Hyperlipidemia 2003  . Hypertension   . 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 Hypothyroidism (acquired) 2000  . Nocturia   . Osteomyelitis ([place])    right great  . Pituitary mass ([place]) 2000   S/p transphenoidal excision 05/1999 ([facility])  . Vitamin D deficiency 2009   Past Surgical History:  Procedure Laterality Date  . AMPUTATION Right 12/20/2017   Procedure: RIGHT 1ST RAY AMPUTATION;  Surgeon: [name], MD;  Location: [facility];  Service: Orthopedics;  Laterality: Right;  . BRAIN SURGERY  2000   pituatary gland removed .  Marland Kitchen CHOLECYSTECTOMY  1984  . Ectropion surgery Bilateral 2006  . EYE SURGERY  over last 6 yrs.     trabeculectomy...   . EYE SURGERY   cat ext ou  . JOINT REPLACEMENT  2011   knee left  . KNEE ARTHROSCOPY Right 02/06/2015   Procedure: RIGHT ARTHROSCOPY KNEE WITH LATERAL MENSICAL  DEBRIDEMENT;  Surgeon: Gaynelle Arabian, MD;  [facility];  Service: Orthopedics;  Laterality: Right;  . Mohs procedure      for skin cancer on nose   . NEPHRECTOMY Right 1994   Renal cell  . REVERSE SHOULDER ARTHROPLASTY  12/15/2011   Procedure: REVERSE SHOULDER ARTHROPLASTY;  Surgeon: Marin Shutter, MD;  Location: [facility];  Service: Orthopedics;  Laterality: Right;  right total reverse shoulder  . TONSILLECTOMY    . TOTAL KNEE ARTHROPLASTY Right 06/29/2015   Procedure: TOTAL KNEE ARTHROPLASTY;  Surgeon: Gaynelle Arabian, MD;  [facility];  Service: Orthopedics;  Laterality: Right;  . Transsphenoidal excision pituitary tumor  05/1999   A.Tommi Rumps, M.D.([facility])    reports that he quit smoking about 29 years ago. His smoking use included pipe. He has never used smokeless tobacco. He reports that he drinks alcohol. He reports that he does not use drugs. Social History   Socioeconomic History  . Marital status: Widowed    Spouse name: Not on file  . Number of children: Not on file  . Years of education: Not on file  . Highest education level: Not on file  Occupational History  . Not on file  Social Needs  . Financial resource strain: Not on file  . Food insecurity:    Worry: Not on file    Inability: Not on file  . Transportation needs:    Medical: Not on file    Non-medical: Not on file  Tobacco Use  .  Smoking status: Former Smoker    Types: Pipe    Last attempt to quit: 09/26/1988    Years since quitting: 29.2  . Smokeless tobacco: Never Used  . Tobacco comment: about 20 years  Substance and Sexual Activity  . Alcohol use: Yes    Comment: 1 ounces of vodka nightly   . Drug use: No  . Sexual activity: Not Currently  Lifestyle  . Physical activity:    Days per week: Not on file    Minutes per session: Not on file  . Stress: Not on file  Relationships  . Social connections:    Talks on phone: Not on file    Gets together:  Not on file    Attends religious service: Not on file    Active member of club or organization: Not on file    Attends meetings of clubs or organizations: Not on file    Relationship status: Not on file  . Intimate partner violence:    Fear of current or ex partner: Not on file    Emotionally abused: Not on file    Physically abused: Not on file    Forced sexual activity: Not on file  Other Topics Concern  . Not on file  Social History Narrative   Patient is Married Software engineer). Retired Engineer, mining) Administrator, sports. Lives in apartment, Independent Living section at [facility] since 2000.  Spouse lives in skilled nursing section in same community   Smoking 1994 , moderate alcohol use: 2 drinks a night. Exercises regularly with walking and exercise classes. Continues to drive   Patient has Advanced planning documents: Living Will                Functional Status Survey:  independent with walker, but now requiring assistance with adls and transfers due to nonweightbearing of right leg  Family History  Problem Relation Age of Onset  . Lung cancer Father   . Anesthesia problems Neg Hx   . Hypotension Neg Hx   . Malignant hyperthermia Neg Hx   . Pseudochol deficiency Neg Hx     Health Maintenance  Topic Date Due  . FOOT EXAM  01/22/1933  . OPHTHALMOLOGY EXAM  01/22/1935  . URINE MICROALBUMIN  01/22/1935  .  TETANUS/TDAP  10/27/2008  . INFLUENZA VACCINE  03/29/2018  . HEMOGLOBIN A1C  05/21/2018  . PNA vac Low Risk Adult  Completed    Allergies  Allergen Reactions  . Adhesive [Tape] Other (See Comments)    CAUSES SKIN TEARS, prefers paper tape   . Robaxin [Methocarbamol]     UNSPECIFIED REACTION   . Diovan [Valsartan] Rash    Outpatient Encounter Medications as of 12/26/2017  Medication Sig  . acetaminophen (TYLENOL) 500 MG tablet Take 500 mg by mouth daily as needed for fever.   . Cholecalciferol (VITAMIN D) 2000 units tablet Take 2,000 Units by mouth daily.  . Coenzyme Q10 300 MG CAPS Take 300 mg by mouth daily.   Marland Kitchen docusate sodium (COLACE) 100 MG capsule Take 200 mg by mouth at bedtime.  . finasteride (PROSCAR) 5 MG tablet Take 5 mg by mouth every morning. Take 0.5 by mouth every evening  . Flaxseed, Linseed, (FLAX SEEDS PO) Take 1 Dose by mouth daily.  Marland Kitchen HYDROcodone-acetaminophen (NORCO/VICODIN) 5-325 MG tablet Take 1 tablet by mouth every 4 (four) hours as needed for moderate pain.  Marland Kitchen lactobacillus acidophilus & bulgar (LACTINEX) chewable tablet Chew 1 tablet by mouth 3 (three) times daily with meals.  . Multiple Vitamins-Minerals (MULTIVITAMIN & MINERAL PO) Take 1 tablet by mouth daily.  Vladimir Faster Glycol-Propyl Glycol (SYSTANE OP) Place 1 drop into both eyes daily as needed (dry eyes).  . pravastatin (PRAVACHOL) 80 MG tablet Take 80 mg by mouth every evening.   . predniSONE (DELTASONE) 5 MG tablet Take 5 mg by mouth daily with breakfast.   . SYNTHROID 88 MCG tablet Take 88 mcg by mouth daily before breakfast.   . testosterone cypionate (DEPOTESTOSTERONE CYPIONATE) 200 MG/ML injection Inject 30 mg (0.15 ml) intramuscularly weekly. Must discard vial after 90 days.  . vitamin B-12 (CYANOCOBALAMIN) 500 MCG tablet Take 500 mcg by mouth daily.  . [DISCONTINUED]  doxycycline (VIBRA-TABS) 100 MG tablet Take 1 tablet (100 mg total) by mouth 2 (two) times daily.  . [DISCONTINUED] Lidocaine 4  % PTCH Apply 1 patch topically daily as needed (back pain).  . [DISCONTINUED] omega-3 acid ethyl esters (LOVAZA) 1 g capsule Take 1 capsule (1 g total) by mouth daily. (Patient not taking: Reported on 12/18/2017)  . [DISCONTINUED] Omega-3 Fatty Acids (OMEGA 3 PO) Take 700 mg by mouth daily.  . [DISCONTINUED] pantoprazole (PROTONIX) 40 MG tablet TAKE 1 TABLET BY MOUTH DAILY. (Patient taking differently: Take 40 mg by mouth once daily as needed for acid reflux)   No facility-administered encounter medications on file as of 12/26/2017.     Review of Systems  Constitutional: Positive for activity change. Negative for appetite change, chills and fever.  HENT: Negative for congestion.   Eyes: Negative for visual disturbance.  Respiratory: Negative for apnea, choking, chest tightness and shortness of breath.   Cardiovascular: Positive for leg swelling.       Mild swelling but much improved in right leg vs left  Genitourinary: Negative for dysuria.  Musculoskeletal: Positive for arthralgias and gait problem.  Skin: Negative for color change.  Neurological: Negative for dizziness and weakness.  Hematological: Bruises/bleeds easily.  Psychiatric/Behavioral: Negative for confusion.    Vitals:   12/26/17 1008  BP: 130/64  Pulse: 84  Resp: 12  Temp: 97.7 F (36.5 C)  TempSrc: Oral  SpO2: 99%  Weight: 162 lb (73.5 kg)   Body mass index is 22.59 kg/m. Physical Exam  Constitutional: He is oriented to person, place, and time. He appears well-developed and well-nourished. No distress.  Cardiovascular: Normal rate, regular rhythm, normal heart sounds and intact distal pulses.  Pulmonary/Chest: Effort normal and breath sounds normal. No respiratory distress.  Abdominal: Bowel sounds are normal.  Musculoskeletal: Normal range of motion.  Neurological: He is alert and oriented to person, place, and time.  Skin:  Very thin easily bruised skin from steroids, dressing in place on right foot covered  with coban, mild swelling, erythema of right leg and warmth vs left, but no tenderness  Psychiatric: He has a normal mood and affect.    Labs reviewed: Basic Metabolic Panel: Recent Labs    11/29/17 1210 11/30/17 0329 12/01/17 0330 12/20/17 1127  NA 134* 140  --  136  K 3.7 3.7  --  4.0  CL 100* 106  --  101  CO2 26 27  --  26  GLUCOSE 88 128*  --  116*  BUN 20 20  --  24*  CREATININE 1.31* 1.27* 1.64* 1.29*  CALCIUM 8.4* 8.5*  --  9.7   Liver Function Tests: Recent Labs    06/12/17 11/29/17 1210  AST 13* 15  ALT 14 13*  ALKPHOS 46 51  BILITOT  --  1.3*  PROT  --  5.5*  ALBUMIN  --  2.7*   No results for input(s): LIPASE, AMYLASE in the last 8760 hours. No results for input(s): AMMONIA in the last 8760 hours. CBC: Recent Labs    11/17/17 2326  11/29/17 1210 11/30/17 0329 12/20/17 1127  WBC 14.7*   < > 30.5* 17.1* 16.0*  NEUTROABS 11.7*  --  26.5*  --   --   HGB 10.9*   < > 10.9* 10.1* 12.7*  HCT 33.2*   < > 32.8* 30.9* 39.2  MCV 97.1   < > 98.8 99.7 98.5  PLT 270   < > 227 211 285   < > =  values in this interval not displayed.   Cardiac Enzymes: No results for input(s): CKTOTAL, CKMB, CKMBINDEX, TROPONINI in the last 8760 hours. BNP: Invalid input(s): POCBNP Lab Results  Component Value Date   HGBA1C 6.2 (H) 11/18/2017   Lab Results  Component Value Date   TSH <0.010 (L) 10/12/2016   Lab Results  Component Value Date   VITAMINB12 1,321 06/12/2017   Assessment/Plan 1. Osteomyelitis of great toe of right foot (HCC) -s/p complete ray excision of first toe of right foot -not on abx -only mild erythema, swelling and warmth of right leg vs leg--dressing intact per surgery request -f/u with Dr. Sharol Given tomorrow as planned  2. 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 Elyse Hsu from endocrine  3. Weakness -ongoing, cont PT, OT, pt also doing some arm strength on his own  4. Adrenal  insufficiency (HCC) -cont bid prednisone dosing for this--needs indefinitely and does damage his skin--numerous skin tears have occurred--currently on elbows bilaterally where he brushed against a rough wheelchair  5. CKD (chronic kidney disease) stage 3, GFR 30-59 ml/min (HCC) -ongoing, Avoid nephrotoxic agents like nsaids, dose adjust renally excreted meds, hydrate.  6. Benign prostatic hyperplasia with urinary frequency -no recent changes, cont finasteride  Family/ staff Communication: discussed with rehab nursing  Labs/tests ordered:  No new  Marijke Guadiana L. Ory Elting, D.O. [facility] [address], [place] [address] Cell Phone (Mon-Fri 8am-5pm):  [phone] On Call:  [phone] & follow prompts after 5pm & weekends Office Phone:  [phone] Office Fax:  [phone]

## 2017-12-27 ENCOUNTER — Ambulatory Visit (INDEPENDENT_AMBULATORY_CARE_PROVIDER_SITE_OTHER): Payer: PPO

## 2017-12-27 ENCOUNTER — Encounter: Payer: Self-pay | Admitting: Internal Medicine

## 2017-12-27 ENCOUNTER — Encounter (INDEPENDENT_AMBULATORY_CARE_PROVIDER_SITE_OTHER): Payer: Self-pay

## 2017-12-27 VITALS — Ht 71.0 in | Wt 162.0 lb

## 2017-12-27 DIAGNOSIS — M869 Osteomyelitis, unspecified: Secondary | ICD-10-CM

## 2017-12-27 NOTE — Progress Notes (Signed)
Patient is s/p a right 1st ray amputation on 12/20/17. In the office today non weight bearing in a wheelchair with surgical dressing and post op shoe intact. The pt states that he has not had any pain and has not needed any pain medication at all since surgery. The incision is well approximated and stitches are intact. There is no redness or drainage. The pt does have slight swelling and some bruising at the toes. ABD, and ace bandage were applied. Orders completed for [facility] to wash the foot daily with dial soap and water, apply dry dressing and to remain non weight bearing on the right side. Pt will follow up in one week with Dr. Sharol Given and call with any questions.   [name] [name], [place], [employer]

## 2017-12-28 ENCOUNTER — Ambulatory Visit (INDEPENDENT_AMBULATORY_CARE_PROVIDER_SITE_OTHER): Payer: PPO

## 2017-12-28 ENCOUNTER — Inpatient Hospital Stay (INDEPENDENT_AMBULATORY_CARE_PROVIDER_SITE_OTHER): Payer: PPO | Admitting: Orthopedic Surgery

## 2017-12-28 DIAGNOSIS — Z89411 Acquired absence of right great toe: Secondary | ICD-10-CM | POA: Insufficient documentation

## 2018-01-03 ENCOUNTER — Ambulatory Visit (INDEPENDENT_AMBULATORY_CARE_PROVIDER_SITE_OTHER): Payer: PPO | Admitting: Orthopedic Surgery

## 2018-01-04 ENCOUNTER — Ambulatory Visit (INDEPENDENT_AMBULATORY_CARE_PROVIDER_SITE_OTHER): Payer: PPO | Admitting: Orthopedic Surgery

## 2018-01-04 ENCOUNTER — Encounter (INDEPENDENT_AMBULATORY_CARE_PROVIDER_SITE_OTHER): Payer: Self-pay | Admitting: Orthopedic Surgery

## 2018-01-04 DIAGNOSIS — Z89421 Acquired absence of other right toe(s): Secondary | ICD-10-CM

## 2018-01-04 DIAGNOSIS — S98131A Complete traumatic amputation of one right lesser toe, initial encounter: Secondary | ICD-10-CM

## 2018-01-04 NOTE — Progress Notes (Signed)
Office Visit Note   Patient: [name] [name].           Date of Birth: 05-Jan-1923           MRN: [id number] Visit Date: 01/04/2018              Requested by: [name], DO [address] [place], [place] [address] PCP: [name], DO  Chief Complaint  Patient presents with  . Right Foot - Routine Post Op      HPI: Patient presents 2 weeks status post right first ray amputation.  Patient currently resides at [facility].  Assessment & Plan: Visit Diagnoses:  1. Amputated toe of right foot ([place])     Plan: Patient is given a prescription for physical therapy for progressive ambulation touchdown weightbearing on the right for 2 weeks and then full weightbearing on the right.  Patient is given a prescription for [facility] for extra-depth shoes orthotics and a spacer.  Follow-Up Instructions: Return in about 3 weeks (around 01/25/2018).   Ortho Exam  Patient is alert, oriented, no adenopathy, well-dressed, normal affect, normal respiratory effort. Examination patient is 2 weeks out from surgery.  He has completely healed the incision there is no redness no cellulitis no drainage no signs of infection he does have some venous stasis changes in his leg but there is no swelling at this time.  Imaging: No results found. No images are attached to the encounter.  Labs: Lab Results  Component Value Date   HGBA1C 6.2 (H) 11/18/2017   ESRSEDRATE 35 (H) 11/29/2017   ESRSEDRATE 57 (H) 11/18/2017   CRP 11.4 (H) 11/29/2017   CRP 8.5 (H) 11/18/2017   REPTSTATUS 12/04/2017 FINAL 11/29/2017   REPTSTATUS 12/04/2017 FINAL 11/29/2017   REPTSTATUS 11/30/2017 FINAL 11/29/2017   CULT  11/29/2017    NO GROWTH 5 DAYS Performed at [facility], [place] [address] [place], [place] [address]    CULT  11/29/2017    NO GROWTH 5 DAYS Performed at [facility] [address] [place], [place] [address]    CULT  11/29/2017    NO GROWTH Performed at [facility] [address], [place], [place] [address]      Lab Results  Component Value Date   ALBUMIN 2.7 (L) 11/29/2017   ALBUMIN 3.6 10/12/2016   ALBUMIN 3.4 (L) 04/05/2016   PREALBUMIN 14.7 (L) 11/18/2017    There is no height or weight on file to calculate BMI.  Orders:  No orders of the defined types were placed in this encounter.  No orders of the defined types were placed in this encounter.    Procedures: No procedures performed  Clinical Data: No additional findings.  ROS:  All other systems negative, except as noted in the HPI. Review of Systems  Objective: Vital Signs: There were no vitals taken for this visit.  Specialty Comments:  No specialty comments available.  PMFS History: Patient Active Problem List   Diagnosis Date Noted  . History of complete ray amputation of first toe of right foot ([place]) 12/28/2017  . Osteomyelitis of great toe of right foot ([place])   . Idiopathic chronic venous hypertension of both lower extremities with inflammation 12/13/2017  . Osteomyelitis ([place]) 11/29/2017  . Thrush 11/29/2017  . Plantar ulcer of right foot ([place])   . Ulcer of right foot with fat layer exposed ([place]) 11/18/2017  . Cellulitis of right foot 11/18/2017  . Cellulitis of right ankle  11/18/2017  . Sepsis ([place]) 11/18/2017  . Adrenal insufficiency ([place]) 07/01/2017  . Bulging of intervertebral disc between L4 and L5 07/01/2017  . At high risk for bleeding 07/01/2017  . Right buttock pain 02/08/2017  . Leukocytosis 10/12/2016  . HLD (hyperlipidemia) 04/20/2016  . BPH (benign prostatic hyperplasia) 12/08/2015  . CKD (chronic kidney disease) stage 3, GFR 30-59 ml/min (HCC) 11/27/2015  . S/P total knee arthroplasty 07/13/2015  . OA (osteoarthritis) of knee 06/29/2015  . Ectropion 05/06/2015  . Lateral meniscal tear 02/05/2015  . Hyponatremia 10/20/2013  . Weakness 10/19/2013  . Hypertension   . 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 Hypothyroidism (acquired)     . Anemia, B12 deficiency   . Meibomian gland disease 08/06/2013  . Primary open angle glaucoma 06/19/2013  . Arthritis of shoulder region, right 12/15/2011  . Exposure keratitis 08/17/2011  . Dry eye 07/13/2011   Past Medical History:  Diagnosis Date  . Allergic rhinitis    Prior allergy shots 20 years  . Anemia   . Anemia, B12 deficiency 2000  . Arthritis    Status post left total replacement 8 2011  . BPH (benign prostatic hyperplasia) 2013  . Cancer ([place])    renal cell ca and skin cancer   . Colitis 2014  . Difficult intubation 1994   surgery had to be  stopped due to injury to "throat"  . Difficult intubation 1994   no trouble since.  Required nasotracheal intubation  '02 [facility] / ANESTHESIA RECORD FROM 2013 AND 2016 IN EPIC  . GERD (gastroesophageal reflux disease)   . Glaucoma 2007   Status post left trabeculectomy 2007  . History of colon polyps    Colonoscopy 2001  . History of kidney stones   . History of renal cell carcinoma 1994   Status post right nephrectomy  . Hyperlipidemia 2003  . Hypertension   . 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 Hypothyroidism (acquired) 2000  . Nocturia   . Osteomyelitis ([place])    right great  . Pituitary mass ([place]) 2000   S/p transphenoidal excision 05/1999 ([facility])  . Vitamin D deficiency 2009    Family History  Problem Relation Age of Onset  . Lung cancer Father   . Anesthesia problems Neg Hx   . Hypotension Neg Hx   . Malignant hyperthermia Neg Hx   . Pseudochol deficiency Neg Hx     Past Surgical History:  Procedure Laterality Date  . AMPUTATION Right 12/20/2017   Procedure: RIGHT 1ST RAY AMPUTATION;  Surgeon: [name], MD;  Location: [facility];  Service: Orthopedics;  Laterality: Right;  . BRAIN SURGERY  2000   pituatary gland removed .  Marland Kitchen CHOLECYSTECTOMY  1984  . Ectropion surgery Bilateral 2006  . EYE SURGERY  over last 6 yrs.     trabeculectomy...   . EYE  SURGERY   cat ext ou  . JOINT REPLACEMENT  2011   knee left  . KNEE ARTHROSCOPY Right 02/06/2015   Procedure: RIGHT ARTHROSCOPY KNEE WITH LATERAL MENSICAL  DEBRIDEMENT;  Surgeon: Gaynelle Arabian, MD;  Location: [facility];  Service: Orthopedics;  Laterality: Right;  . Mohs procedure      for skin cancer on nose   . NEPHRECTOMY Right 1994   Renal cell  . REVERSE SHOULDER ARTHROPLASTY  12/15/2011   Procedure: REVERSE SHOULDER ARTHROPLASTY;  Surgeon: Marin Shutter, MD;  Location: [facility];  Service: Orthopedics;  Laterality:  Right;  right total reverse shoulder  . TONSILLECTOMY    . TOTAL KNEE ARTHROPLASTY Right 06/29/2015   Procedure: TOTAL KNEE ARTHROPLASTY;  Surgeon: Gaynelle Arabian, MD;  [facility];  Service: Orthopedics;  Laterality: Right;  . Transsphenoidal excision pituitary tumor  05/1999   A.Tommi Rumps, M.D.([facility])   Social History   Occupational History  . Not on file  Tobacco Use  . Smoking status: Former Smoker    Types: Pipe    Last attempt to quit: 09/26/1988    Years since quitting: 29.2  . Smokeless tobacco: Never Used  . Tobacco comment: about 20 years  Substance and Sexual Activity  . Alcohol use: Yes    Comment: 1 ounces of vodka nightly   . Drug use: No  . Sexual activity: Not Currently

## 2018-01-08 ENCOUNTER — Telehealth (INDEPENDENT_AMBULATORY_CARE_PROVIDER_SITE_OTHER): Payer: Self-pay | Admitting: Radiology

## 2018-01-08 DIAGNOSIS — R278 Other lack of coordination: Secondary | ICD-10-CM | POA: Diagnosis not present

## 2018-01-08 DIAGNOSIS — Z89411 Acquired absence of right great toe: Secondary | ICD-10-CM | POA: Diagnosis not present

## 2018-01-08 DIAGNOSIS — R2689 Other abnormalities of gait and mobility: Secondary | ICD-10-CM | POA: Diagnosis not present

## 2018-01-08 DIAGNOSIS — S98111S Complete traumatic amputation of right great toe, sequela: Secondary | ICD-10-CM | POA: Diagnosis not present

## 2018-01-08 DIAGNOSIS — M86271 Subacute osteomyelitis, right ankle and foot: Secondary | ICD-10-CM | POA: Diagnosis not present

## 2018-01-08 NOTE — Telephone Encounter (Signed)
[name] [name] is needing a WTB status, and Non compliant with current WTB status  He want to know if they can continue to full WTB.  Please call to advise.

## 2018-01-09 ENCOUNTER — Telehealth (INDEPENDENT_AMBULATORY_CARE_PROVIDER_SITE_OTHER): Payer: Self-pay | Admitting: Orthopedic Surgery

## 2018-01-09 ENCOUNTER — Non-Acute Institutional Stay (SKILLED_NURSING_FACILITY): Payer: PPO | Admitting: Internal Medicine

## 2018-01-09 ENCOUNTER — Encounter: Payer: Self-pay | Admitting: Internal Medicine

## 2018-01-09 DIAGNOSIS — Z89411 Acquired absence of right great toe: Secondary | ICD-10-CM

## 2018-01-09 DIAGNOSIS — Z9111 Patient's noncompliance with dietary regimen: Secondary | ICD-10-CM | POA: Diagnosis not present

## 2018-01-09 DIAGNOSIS — M869 Osteomyelitis, unspecified: Secondary | ICD-10-CM | POA: Diagnosis not present

## 2018-01-09 DIAGNOSIS — Z91199 Patient's noncompliance with other medical treatment and regimen due to unspecified reason: Secondary | ICD-10-CM

## 2018-01-09 NOTE — Telephone Encounter (Signed)
Patient called requesting a call back concerning what Dr. Sharol Given told him at his appt vs what orders say? Patients # [phone]

## 2018-01-09 NOTE — Progress Notes (Signed)
Patient ID: [name] Starks., male   DOB: 12-29-1922, 84 y.o.   MRN: [id number]  Location:  [facility] Room Number: [place] of Service:  SNF [phone]) Provider:   [name], DO  Patient Care Team: [name], DO as PCP - General (Geriatric Medicine) Community, [name], [name], MD as Referring Physician (Endocrinology)  Extended Emergency Contact Information Primary Emergency Contact: [name],[name] Address: [address]          [place], [place] [address] [name] of [place] Phone: [phone] Mobile Phone: [phone] Relation: Son Secondary Emergency Contact: Barnetta [name] , [name] Address: [address]          [place], [place] [address] [name] of [place] Phone: [phone] Relation: Son  Code Status:  DNR Goals of care: Advanced Directive information Advanced Directives 01/09/2018  Does Patient Have a Medical Advance Directive? Yes  Type of Paramedic of [place];Out of facility DNR (pink MOST or yellow form)  Does patient want to make changes to medical advance directive? No - Patient declined  Copy of [place] in Chart? Yes  Would patient like information on creating a medical advance directive? -  Pre-existing out of facility DNR order (yellow form or pink MOST form) Yellow form placed in chart (order not valid for inpatient use);Pink MOST form placed in chart (order not valid for inpatient use)     Chief Complaint  Patient presents with  . Acute Visit    redness, swelling in foot    HPI:  Pt is a 84 y.o. male with h/o panhypopituiiarism, adrenal insufficiency, unsteady gait, and many more, most recently osteomyelitis of his right great toe for which he underwent right first ray amputation on 12/20/17 with Dr. Sharol Given seen today for an acute visit for redness and swelling in his right posterior foot after ambulating on foot  rather than following his partial weightbearing status.  Rehab nursing was concerned about the redness at the posterior aspect of the wound.  Pt reports he was told he could walk on the foot, but orders received said touchdown eightbearing only using walker for 2 weeks then full ambulation beginning 5/24.  They are waiting for clarification from Dr. Sharol Given b/c pt has been known to jump the gun to get home earlier which has led to his current situation.  He denies pain overall in the foot.  He's been afebrile and feels well.  He can't wait to get home.  He has been properly elevating his feet at rest.  He's kept busy doing computer work while here.  Dressing has small amount of blood-tinged drainage, but no foul odor.     Past Medical History:  Diagnosis Date  . Allergic rhinitis    Prior allergy shots 20 years  . Anemia   . Anemia, B12 deficiency 2000  . Arthritis    Status post left total replacement 8 2011  . BPH (benign prostatic hyperplasia) 2013  . Cancer ([facility])    renal cell ca and skin cancer   . Colitis 2014  . Difficult intubation 1994   surgery had to be  stopped due to injury to "throat"  . Difficult intubation 1994   no trouble since.  Required nasotracheal intubation  '02 [facility] / ANESTHESIA RECORD FROM 2013 AND 2016 IN EPIC  . GERD (gastroesophageal reflux disease)   . Glaucoma 2007   Status post left trabeculectomy 2007  . History of  colon polyps    Colonoscopy 2001  . History of kidney stones   . History of renal cell carcinoma 1994   Status post right nephrectomy  . Hyperlipidemia 2003  . Hypertension   . 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 Hypothyroidism (acquired) 2000  . Nocturia   . Osteomyelitis ([place])    right great  . Pituitary mass ([facility]) 2000   S/p transphenoidal excision 05/1999 ([facility])  . Vitamin D deficiency 2009   Past Surgical History:  Procedure Laterality Date  . AMPUTATION Right 12/20/2017    Procedure: RIGHT 1ST RAY AMPUTATION;  Surgeon: [name], MD;  Location: [facility];  Service: Orthopedics;  Laterality: Right;  . BRAIN SURGERY  2000   pituatary gland removed .  Marland Kitchen CHOLECYSTECTOMY  1984  . Ectropion surgery Bilateral 2006  . EYE SURGERY  over last 6 yrs.     trabeculectomy...   . EYE SURGERY   cat ext ou  . JOINT REPLACEMENT  2011   knee left  . KNEE ARTHROSCOPY Right 02/06/2015   Procedure: RIGHT ARTHROSCOPY KNEE WITH LATERAL MENSICAL  DEBRIDEMENT;  Surgeon: Gaynelle Arabian, MD;  Location: [facility];  Service: Orthopedics;  Laterality: Right;  . Mohs procedure      for skin cancer on nose   . NEPHRECTOMY Right 1994   Renal cell  . REVERSE SHOULDER ARTHROPLASTY  12/15/2011   Procedure: REVERSE SHOULDER ARTHROPLASTY;  Surgeon: Marin Shutter, MD;  Location: [facility];  Service: Orthopedics;  Laterality: Right;  right total reverse shoulder  . TONSILLECTOMY    . TOTAL KNEE ARTHROPLASTY Right 06/29/2015   Procedure: TOTAL KNEE ARTHROPLASTY;  Surgeon: Gaynelle Arabian, MD;  Location: [facility];  Service: Orthopedics;  Laterality: Right;  . Transsphenoidal excision pituitary tumor  05/1999   A.Tommi Rumps, M.D.([facility])    Allergies  Allergen Reactions  . Adhesive [Tape] Other (See Comments)    CAUSES SKIN TEARS, prefers paper tape   . Robaxin [Methocarbamol]     UNSPECIFIED REACTION   . Diovan [Valsartan] Rash    Outpatient Encounter Medications as of 01/09/2018  Medication Sig  . acetaminophen (TYLENOL) 500 MG tablet Take 500 mg by mouth daily as needed for fever.   . Cholecalciferol (VITAMIN D) 2000 units tablet Take 2,000 Units by mouth daily.  . Coenzyme Q10 300 MG CAPS Take 300 mg by mouth daily.   Marland Kitchen docusate sodium (COLACE) 100 MG capsule Take 200 mg by mouth at bedtime.  . finasteride (PROSCAR) 5 MG tablet Take 5 mg by mouth every morning. Take 0.5 by mouth every evening  . Flaxseed, Linseed, (FLAX SEEDS PO) Take 1 Dose by mouth daily.  Marland Kitchen HYDROcodone-acetaminophen  (NORCO/VICODIN) 5-325 MG tablet Take 1 tablet by mouth every 4 (four) hours as needed for moderate pain.  Marland Kitchen lactobacillus acidophilus & bulgar (LACTINEX) chewable tablet Chew 1 tablet by mouth 3 (three) times daily with meals.  . Multiple Vitamins-Minerals (MULTIVITAMIN & MINERAL PO) Take 1 tablet by mouth daily.  Vladimir Faster Glycol-Propyl Glycol (SYSTANE OP) Place 1 drop into both eyes daily as needed (dry eyes).  . pravastatin (PRAVACHOL) 80 MG tablet Take 80 mg by mouth every evening.   . predniSONE (DELTASONE) 5 MG tablet Take 5 mg by mouth daily with breakfast.   . SYNTHROID 88 MCG tablet Take 88 mcg by mouth daily before breakfast.   . testosterone cypionate (DEPOTESTOSTERONE CYPIONATE) 200 MG/ML injection Inject 30 mg (0.15 ml) intramuscularly weekly.  Must discard vial after 90 days.  . vitamin B-12 (CYANOCOBALAMIN) 500 MCG tablet Take 500 mcg by mouth daily.   No facility-administered encounter medications on file as of 01/09/2018.     Review of Systems  Constitutional: Negative for activity change, appetite change, chills and fever.  Musculoskeletal: Positive for gait problem.  Skin:       Erythema of posterior wound of right foot (reported to be new per nursing) and slight erythema of shin (unchanged)    Immunization History  Administered Date(s) Administered  . Influenza, High Dose Seasonal PF 04/30/2017  . Influenza, Quadrivalent, Recombinant, Inj, Pf 05/19/2016  . Pneumococcal Conjugate-13 06/04/2015  . Pneumococcal Polysaccharide-23 06/29/2010  . Tdap 10/28/1998  . Zoster 10/27/2005   Pertinent  Health Maintenance Due  Topic Date Due  . FOOT EXAM  01/22/1933  . OPHTHALMOLOGY EXAM  01/22/1935  . URINE MICROALBUMIN  01/22/1935  . INFLUENZA VACCINE  03/29/2018  . HEMOGLOBIN A1C  05/21/2018  . PNA vac Low Risk Adult  Completed   Fall Risk  12/11/2017 11/01/2017 10/11/2017 07/18/2017 06/20/2017  Falls in the past year? Yes No Yes No No  Number falls in past yr: 1 - 1 - -    Comment 1 slip in the bathroom, no injury - - - -  Injury with Fall? No - Yes - -  Risk Factor Category  - - - - -  Risk for fall due to : - - - - -   Functional Status Survey:    Vitals:   01/09/18 1452  BP: 121/72  Pulse: 80  Resp: 20  Temp: 97.9 F (36.6 C)  TempSrc: Oral  SpO2: 96%  Weight: 171 lb (77.6 kg)  Height: 5\' 11"  (1.803 m)   Body mass index is 23.85 kg/m. Physical Exam  Constitutional: He appears well-developed. No distress.  Skin:  Mild erythema at posterior aspect of surgical wound, only small amount of serosanguinous drainage now present, no foul odor, no increased warmth or tenderness, wound is well-secured, chronic persistent mild erythema of right vs left shin, mild swelling of lower leg and foot vs left, but not changed    Labs reviewed: Recent Labs    11/29/17 1210 11/30/17 0329 12/01/17 0330 12/20/17 1127  NA 134* 140  --  136  K 3.7 3.7  --  4.0  CL 100* 106  --  101  CO2 26 27  --  26  GLUCOSE 88 128*  --  116*  BUN 20 20  --  24*  CREATININE 1.31* 1.27* 1.64* 1.29*  CALCIUM 8.4* 8.5*  --  9.7   Recent Labs    06/12/17 11/29/17 1210  AST 13* 15  ALT 14 13*  ALKPHOS 46 51  BILITOT  --  1.3*  PROT  --  5.5*  ALBUMIN  --  2.7*   Recent Labs    11/17/17 2326  11/29/17 1210 11/30/17 0329 12/20/17 1127  WBC 14.7*   < > 30.5* 17.1* 16.0*  NEUTROABS 11.7*  --  26.5*  --   --   HGB 10.9*   < > 10.9* 10.1* 12.7*  HCT 33.2*   < > 32.8* 30.9* 39.2  MCV 97.1   < > 98.8 99.7 98.5  PLT 270   < > 227 211 285   < > = values in this interval not displayed.   Lab Results  Component Value Date   TSH <0.010 (L) 10/12/2016   Lab Results  Component Value Date  HGBA1C 6.2 (H) 11/18/2017   Lab Results  Component Value Date   CHOL 130 06/12/2017   HDL 62 06/12/2017   LDLCALC 65 06/12/2017   TRIG 70 06/12/2017   Assessment/Plan 1. Noncompliance with treatment plan -reinforced importance of sticking with plan set forth by orthopedic  surgeon to ensure wound does not open, become infected or slow in its healing -await clarification of weightbearing status since pt claims he was told he could put more weight on the foot than instructions received   2. Osteomyelitis of great toe of right foot ([facility]) -s/p right ray amputation -some erythema present on posterior aspect of wound, but no other signs of new infection after surgery--cont to monitor and follow postop instructions  3. History of complete ray amputation of first toe of right foot ([facility]) -keep f/u with Dr. Sharol Given as planned  Family/ staff Communication: discussed with rehab nurse  Labs/tests ordered:  No new  Deana Krock L. Jailee Jaquez, D.O. [facility] [address], [place] [address] Cell Phone (Mon-Fri 8am-5pm):  [phone] On Call:  [phone] & follow prompts after 5pm & weekends Office Phone:  [phone] Office Fax:  [phone]

## 2018-01-09 NOTE — Telephone Encounter (Signed)
I called and we talked about the pt and his insistence that he can do more that tdwtb advised ok to wtb through heel ( 3 point gait patter per PT) to keep for rolling up on forefoot and will demonstrate this to him tomorrow. And in one more week may move to full wtb

## 2018-01-09 NOTE — Telephone Encounter (Signed)
[name] [name], PT, returned Dr. Jess Barters phone call.  His number is [phone].  Thank you.

## 2018-01-09 NOTE — Telephone Encounter (Signed)
Called and lm on vm to advise taht per the last dictation the pt is progressive ambulation with touch down weight bearing on the right for 2 weeks and then can be full weight bearing after that. To call with questions.

## 2018-01-09 NOTE — Telephone Encounter (Signed)
Message sent in error

## 2018-01-10 NOTE — Telephone Encounter (Signed)
Called to advise that I had spoken with physical therapy yesterday and he can be weight bearing though the heel for 2 weeks then full weight bearing as per his appt not on 01/04/18. Pt voiced understanding and will call with any questions.

## 2018-01-12 ENCOUNTER — Non-Acute Institutional Stay (SKILLED_NURSING_FACILITY): Payer: PPO | Admitting: Adult Health

## 2018-01-12 ENCOUNTER — Encounter: Payer: Self-pay | Admitting: Adult Health

## 2018-01-12 DIAGNOSIS — E039 Hypothyroidism, unspecified: Secondary | ICD-10-CM

## 2018-01-12 DIAGNOSIS — D72829 Elevated white blood cell count, unspecified: Secondary | ICD-10-CM

## 2018-01-12 DIAGNOSIS — E2740 Unspecified adrenocortical insufficiency: Secondary | ICD-10-CM

## 2018-01-12 DIAGNOSIS — Z89411 Acquired absence of right great toe: Secondary | ICD-10-CM | POA: Diagnosis not present

## 2018-01-12 DIAGNOSIS — M869 Osteomyelitis, unspecified: Secondary | ICD-10-CM | POA: Diagnosis not present

## 2018-01-12 NOTE — Progress Notes (Signed)
Location:  Occupational psychologist of Service:  SNF (31)  Provider:  Cindi Carbon, ANP [place] [phone]   PCP: [name], DO Patient Care Team: [name], DO as PCP - General (Geriatric Medicine) Community, [name], [name], MD as Referring Physician (Endocrinology)  Extended Emergency Contact Information Primary Emergency Contact: Ballo,[name] Address: [address]          [place], [place] [address] [name] of [place] Phone: [phone] Mobile Phone: [phone] Relation: Son Secondary Emergency Contact: Barnetta Hammersmith , [place] Address: [address]          [place], [place] [address] [name] of [place] Phone: [phone] Relation: Son  Code Status: DNR Goals of care:  Advanced Directive information Advanced Directives 01/09/2018  Does Patient Have a Medical Advance Directive? Yes  Type of Paramedic of [place];Out of facility DNR (pink MOST or yellow form)  Does patient want to make changes to medical advance directive? No - Patient declined  Copy of [place] in Chart? Yes  Would patient like information on creating a medical advance directive? -  Pre-existing out of facility DNR order (yellow form or pink MOST form) Yellow form placed in chart (order not valid for inpatient use);Pink MOST form placed in chart (order not valid for inpatient use)     Allergies  Allergen Reactions  . Adhesive [Tape] Other (See Comments)    CAUSES SKIN TEARS, prefers paper tape   . Percocet [Oxycodone-Acetaminophen]   . Robaxin [Methocarbamol]     UNSPECIFIED REACTION   . Diovan [Valsartan] Rash    Chief Complaint  Patient presents with  . Discharge Note    HPI:  84 y.o. male [facility].  He has a hx of chronic ulceration and osteomyelitis of the right great toe and first metatarsal head and  underwent right first ray amputation on 12/20/17.  He has an incision to the right foot that has dry dressing changes performed daily. He is not using any medication for pain and does have a hx of underlying neuropathy to his feet.  His VS have been stable. He is ambulating with a walker with orders for WBAT to right heal limiting wb as much as possible to the toes using a walker. No fevers or other abnormal vitals noted. WBC  16 on 12/20/17 which trended down from 17 on 11/30/17.  He is on steroids long term for a hx of adrenal insuff.    Past Medical History:  Diagnosis Date  . Allergic rhinitis    Prior allergy shots 20 years  . Anemia   . Anemia, B12 deficiency 2000  . Arthritis    Status post left total replacement 8 2011  . BPH (benign prostatic hyperplasia) 2013  . Cancer ([place])    renal cell ca and skin cancer   . Colitis 2014  . Difficult intubation 1994   surgery had to be  stopped due to injury to "throat"  . Difficult intubation 1994   no trouble since.  Required nasotracheal intubation  '02 [facility] / ANESTHESIA RECORD FROM 2013 AND 2016 IN EPIC  . GERD (gastroesophageal reflux disease)   . Glaucoma 2007   Status post left trabeculectomy 2007  . History of colon polyps    Colonoscopy 2001  . History of kidney stones   . History of renal cell carcinoma 1994   Status post right  nephrectomy  . Hyperlipidemia 2003  . Hypertension   . 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 Hypothyroidism (acquired) 2000  . Nocturia   . Osteomyelitis ([place])    right great  . Pituitary mass ([place]) 2000   S/p transphenoidal excision 05/1999 ([facility])  . Vitamin D deficiency 2009    Past Surgical History:  Procedure Laterality Date  . AMPUTATION Right 12/20/2017   Procedure: RIGHT 1ST RAY AMPUTATION;  Surgeon: [name], MD;  Location: [place];  Service: Orthopedics;  Laterality: Right;  . BRAIN SURGERY  2000   pituatary gland removed .  Marland Kitchen  CHOLECYSTECTOMY  1984  . Ectropion surgery Bilateral 2006  . EYE SURGERY  over last 6 yrs.     trabeculectomy...   . EYE SURGERY   cat ext ou  . JOINT REPLACEMENT  2011   knee left  . KNEE ARTHROSCOPY Right 02/06/2015   Procedure: RIGHT ARTHROSCOPY KNEE WITH LATERAL MENSICAL  DEBRIDEMENT;  Surgeon: Gaynelle Arabian, MD;  [facility];  Service: Orthopedics;  Laterality: Right;  . Mohs procedure      for skin cancer on nose   . NEPHRECTOMY Right 1994   Renal cell  . REVERSE SHOULDER ARTHROPLASTY  12/15/2011   Procedure: REVERSE SHOULDER ARTHROPLASTY;  Surgeon: Marin Shutter, MD;  Location: [place];  Service: Orthopedics;  Laterality: Right;  right total reverse shoulder  . TONSILLECTOMY    . TOTAL KNEE ARTHROPLASTY Right 06/29/2015   Procedure: TOTAL KNEE ARTHROPLASTY;  Surgeon: Gaynelle Arabian, MD;  [facility];  Service: Orthopedics;  Laterality: Right;  . Transsphenoidal excision pituitary tumor  05/1999   A.Tommi Rumps, M.D.([facility])      reports that he quit smoking about 29 years ago. His smoking use included pipe. He has never used smokeless tobacco. He reports that he drinks alcohol. He reports that he does not use drugs. Social History   Socioeconomic History  . Marital status: Widowed    Spouse name: Not on file  . Number of children: Not on file  . Years of education: Not on file  . Highest education level: Not on file  Occupational History  . Not on file  Social Needs  . Financial resource strain: Not on file  . Food insecurity:    Worry: Not on file    Inability: Not on file  . Transportation needs:    Medical: Not on file    Non-medical: Not on file  Tobacco Use  . Smoking status: Former Smoker    Types: Pipe    Last attempt to quit: 09/26/1988    Years since quitting: 29.3  . Smokeless tobacco: Never Used  . Tobacco comment: about 20 years  Substance and Sexual Activity  . Alcohol use: Yes    Comment: 1 ounces of vodka nightly   . Drug use: No  . Sexual  activity: Not Currently  Lifestyle  . Physical activity:    Days per week: Not on file    Minutes per session: Not on file  . Stress: Not on file  Relationships  . Social connections:    Talks on phone: Not on file    Gets together: Not on file    Attends religious service: Not on file    Active member of club or organization: Not on file    Attends meetings of clubs or organizations: Not on file    Relationship status: Not on file  . Intimate  partner violence:    Fear of current or ex partner: Not on file    Emotionally abused: Not on file    Physically abused: Not on file    Forced sexual activity: Not on file  Other Topics Concern  . Not on file  Social History Narrative   Patient is Married Software engineer). Retired Engineer, mining) Administrator, sports. Lives in apartment, Independent Living section at [facility] since 2000.  Spouse lives in skilled nursing section in same community   Smoking 1994 , moderate alcohol use: 2 drinks a night. Exercises regularly with walking and exercise classes. Continues to drive   Patient has Advanced planning documents: Living Will               Functional Status Survey:    Allergies  Allergen Reactions  . Adhesive [Tape] Other (See Comments)    CAUSES SKIN TEARS, prefers paper tape   . Percocet [Oxycodone-Acetaminophen]   . Robaxin [Methocarbamol]     UNSPECIFIED REACTION   . Diovan [Valsartan] Rash    Pertinent  Health Maintenance Due  Topic Date Due  . FOOT EXAM  01/22/1933  . OPHTHALMOLOGY EXAM  01/22/1933  . URINE MICROALBUMIN  01/22/1933  . INFLUENZA VACCINE  03/29/2018  . HEMOGLOBIN A1C  05/21/2018  . PNA vac Low Risk Adult  Completed    Medications: Outpatient Encounter Medications as of 01/12/2018  Medication Sig  . Omega-3 Fatty Acids (OMEGA-3 FISH OIL PO) Take 700 mg by mouth daily.  . Psyllium (VEGETABLE LAXATIVE PO) Take 2 tablets by mouth at bedtime.  Marland Kitchen acetaminophen (TYLENOL) 500 MG  tablet Take 500 mg by mouth daily as needed for fever.   . Cholecalciferol (VITAMIN D) 2000 units tablet Take 2,000 Units by mouth daily.  . Coenzyme Q10 300 MG CAPS Take 300 mg by mouth daily.   Marland Kitchen docusate sodium (COLACE) 100 MG capsule Take 200 mg by mouth at bedtime.  . finasteride (PROSCAR) 5 MG tablet Take 5 mg by mouth every morning. Take 0.5 by mouth every evening  . HYDROcodone-acetaminophen (NORCO/VICODIN) 5-325 MG tablet Take 1 tablet by mouth every 4 (four) hours as needed for moderate pain.  Marland Kitchen lactobacillus acidophilus & bulgar (LACTINEX) chewable tablet Chew 1 tablet by mouth 3 (three) times daily with meals.  . Multiple Vitamins-Minerals (MULTIVITAMIN & MINERAL PO) Take 1 tablet by mouth daily.  Vladimir Faster Glycol-Propyl Glycol (SYSTANE OP) Place 1 drop into both eyes daily as needed (dry eyes).  . pravastatin (PRAVACHOL) 80 MG tablet Take 80 mg by mouth every evening.   . predniSONE (DELTASONE) 5 MG tablet Take 5 mg by mouth 2 (two) times daily with a meal. 5 mg q am and 2.5 mg qhs  . SYNTHROID 88 MCG tablet Take 88 mcg by mouth daily before breakfast.   . testosterone cypionate (DEPOTESTOSTERONE CYPIONATE) 200 MG/ML injection Inject 30 mg (0.15 ml) intramuscularly weekly. Must discard vial after 90 days.  . vitamin B-12 (CYANOCOBALAMIN) 500 MCG tablet Take 500 mcg by mouth daily.  . [DISCONTINUED] Flaxseed, Linseed, (FLAX SEEDS PO) Take 1 Dose by mouth daily.   No facility-administered encounter medications on file as of 01/12/2018.     Review of Systems  Constitutional: Negative for activity change, appetite change, chills, diaphoresis, fatigue, fever and unexpected weight change.  Respiratory: Negative for cough, shortness of breath, wheezing and stridor.   Cardiovascular: Positive for leg swelling. Negative for chest pain and palpitations.  Gastrointestinal: Negative for abdominal distention, abdominal pain, constipation  and diarrhea.  Genitourinary: Negative for difficulty  urinating and dysuria.  Musculoskeletal: Positive for gait problem. Negative for arthralgias, back pain, joint swelling and myalgias.  Skin: Positive for wound.  Neurological: Negative for dizziness, seizures, syncope, facial asymmetry, speech difficulty, weakness and headaches.  Hematological: Negative for adenopathy. Does not bruise/bleed easily.  Psychiatric/Behavioral: Negative for agitation, behavioral problems and confusion.    Vitals:   01/12/18 1027  BP: 124/62  Pulse: 83  Resp: 20  Temp: 98.5 F (36.9 C)   There is no height or weight on file to calculate BMI. Physical Exam  Constitutional: He is oriented to person, place, and time. No distress.  HENT:  Head: Normocephalic and atraumatic.  Cardiovascular: Normal rate and regular rhythm.  No murmur heard. RLE pulse pedal pulse+2  Pulmonary/Chest: Effort normal and breath sounds normal. No respiratory distress. He has no wheezes.  Abdominal: Soft. Bowel sounds are normal. He exhibits no distension. There is no tenderness.  Neurological: He is alert and oriented to person, place, and time.  Skin: Skin is warm and dry. He is not diaphoretic.  Right foot with incision approximated, steristrips in place. Mild erythema noted. No tenderness. Small amt of yellow drainage on the dressing. Right anterior shin with erythema and small open area without drainage, clear dressing in place.   Psychiatric: He has a normal mood and affect.  Nursing note and vitals reviewed.   Labs reviewed: Basic Metabolic Panel: Recent Labs    11/29/17 1210 11/30/17 0329 12/01/17 0330 12/20/17 1127  NA 134* 140  --  136  K 3.7 3.7  --  4.0  CL 100* 106  --  101  CO2 26 27  --  26  GLUCOSE 88 128*  --  116*  BUN 20 20  --  24*  CREATININE 1.31* 1.27* 1.64* 1.29*  CALCIUM 8.4* 8.5*  --  9.7   Liver Function Tests: Recent Labs    06/12/17 11/29/17 1210  AST 13* 15  ALT 14 13*  ALKPHOS 46 51  BILITOT  --  1.3*  PROT  --  5.5*  ALBUMIN   --  2.7*   No results for input(s): LIPASE, AMYLASE in the last 8760 hours. No results for input(s): AMMONIA in the last 8760 hours. CBC: Recent Labs    11/17/17 2326  11/29/17 1210 11/30/17 0329 12/20/17 1127  WBC 14.7*   < > 30.5* 17.1* 16.0*  NEUTROABS 11.7*  --  26.5*  --   --   HGB 10.9*   < > 10.9* 10.1* 12.7*  HCT 33.2*   < > 32.8* 30.9* 39.2  MCV 97.1   < > 98.8 99.7 98.5  PLT 270   < > 227 211 285   < > = values in this interval not displayed.   Cardiac Enzymes: No results for input(s): CKTOTAL, CKMB, CKMBINDEX, TROPONINI in the last 8760 hours. BNP: Invalid input(s): POCBNP CBG: No results for input(s): GLUCAP in the last 8760 hours.  Procedures and Imaging Studies During Stay: No results found.  Assessment/Plan:    1. Osteomyelitis of great toe of right foot ([place]) S/p amputation The RLE has some erythema at the incision and the mid anterior shin area which is unchanged from admission. F/U with Dr Sharol Given as indicated. [place] to monitor wound and do dressing changes daily. Good pulse and motion noted to the RLE, reduced sensation due to neuropathy  2. History of complete ray amputation of first toe of right foot [facility]) He is  allowed to bear weight has tolerated per Dr Sharol Given but will need to avoid pressure to the toes.  3. Adrenal insufficiency (HCC) Hx of benign pituitary mass removal now on life long prednisone which unfortunately will delay healing  4. Hypothyroidism (acquired) Followed by Dr. Elyse Hsu, continue Synthroid 88 mcg qd.   5. Leukocytosis Trended down at last check Hx of osteo and long term prednisone which may affect readings. Would recheck outpt   Follow up with ortho end of May and may discharge to [place] with nurse to perform dressing change and monitor wound. He is going to [place] next week to have an orthotic made. No scripts were given as he had enough of his current supply      Future labs/tests needed:  CBC

## 2018-01-17 ENCOUNTER — Telehealth (INDEPENDENT_AMBULATORY_CARE_PROVIDER_SITE_OTHER): Payer: Self-pay | Admitting: Orthopedic Surgery

## 2018-01-17 NOTE — Telephone Encounter (Signed)
Patient called wanting to speak with you about some swelling he is having post surgery. CB # [phone]

## 2018-01-17 NOTE — Telephone Encounter (Signed)
Called and sw pt. He is concerned about some swelling that he is having since increasing his activity. He has started to elevate his leg high than his heart several times through the day and states that this does help to resolve the swelling. Advised pt that he is doing all the appropriate things and that if he should develop any other s/s to call and let us know otherwise pt has follow up appt on 01/25/18

## 2018-01-25 ENCOUNTER — Encounter (INDEPENDENT_AMBULATORY_CARE_PROVIDER_SITE_OTHER): Payer: Self-pay | Admitting: Orthopedic Surgery

## 2018-01-25 ENCOUNTER — Ambulatory Visit (INDEPENDENT_AMBULATORY_CARE_PROVIDER_SITE_OTHER): Payer: PPO | Admitting: Orthopedic Surgery

## 2018-01-25 VITALS — Ht 71.0 in | Wt 171.0 lb

## 2018-01-25 DIAGNOSIS — S98131A Complete traumatic amputation of one right lesser toe, initial encounter: Secondary | ICD-10-CM

## 2018-01-25 DIAGNOSIS — Z89421 Acquired absence of other right toe(s): Secondary | ICD-10-CM

## 2018-01-25 NOTE — Progress Notes (Signed)
Office Visit Note   Patient: [name] [name].           Date of Birth: August 18, 1923           MRN: [id number] Visit Date: 01/25/2018              Requested by: [name], DO [address] [place], [place] [address] PCP: [name], DO  Chief Complaint  Patient presents with  . Right Foot - Routine Post Op    12/20/17 right foot 1st ray amputation       HPI: Patient is a 84 year old gentleman who is about 1 month status post great toe amputation right foot.  Patient has no complaints  Assessment & Plan: Visit Diagnoses:  1. Amputated toe of right foot ([place])     Plan: Recommended starting strengthening and aerobic exercise with a stationary bike he may begin weightbearing exercises and balance exercises in 1 month.  Follow-Up Instructions: Return if symptoms worsen or fail to improve.   Ortho Exam  Patient is alert, oriented, no adenopathy, well-dressed, normal affect, normal respiratory effort. Examination the incision is well-healed he has venous stasis swelling with brawny skin color changes again recommend the importance of wearing his medical compression stockings.  Imaging: No results found. No images are attached to the encounter.  Labs: Lab Results  Component Value Date   HGBA1C 6.2 (H) 11/18/2017   ESRSEDRATE 35 (H) 11/29/2017   ESRSEDRATE 57 (H) 11/18/2017   CRP 11.4 (H) 11/29/2017   CRP 8.5 (H) 11/18/2017   REPTSTATUS 12/04/2017 FINAL 11/29/2017   REPTSTATUS 12/04/2017 FINAL 11/29/2017   REPTSTATUS 11/30/2017 FINAL 11/29/2017   CULT  11/29/2017    NO GROWTH 5 DAYS Performed at [facility], [place] [address] [place], [place] [address]    CULT  11/29/2017    NO GROWTH 5 DAYS Performed at [facility] [address], [place], [place] [address]    CULT  11/29/2017    NO GROWTH Performed at [facility] [address] [place], [place] [address]      Lab Results  Component Value Date   ALBUMIN 2.7 (L) 11/29/2017     ALBUMIN 3.6 10/12/2016   ALBUMIN 3.4 (L) 04/05/2016   PREALBUMIN 14.7 (L) 11/18/2017    Body mass index is 23.85 kg/m.  Orders:  No orders of the defined types were placed in this encounter.  No orders of the defined types were placed in this encounter.    Procedures: No procedures performed  Clinical Data: No additional findings.  ROS:  All other systems negative, except as noted in the HPI. Review of Systems  Objective: Vital Signs: Ht 5\' 11"  (1.803 m)   Wt 171 lb (77.6 kg)   BMI 23.85 kg/m   Specialty Comments:  No specialty comments available.  PMFS History: Patient Active Problem List   Diagnosis Date Noted  . History of complete ray amputation of first toe of right foot ([place]) 12/28/2017  . Osteomyelitis of great toe of right foot ([place])   . Idiopathic chronic venous hypertension of both lower extremities with inflammation 12/13/2017  . Osteomyelitis ([place]) 11/29/2017  . Thrush 11/29/2017  . Plantar ulcer of right foot ([place])   . Ulcer of right foot with fat layer exposed ([place]) 11/18/2017  . Cellulitis of right foot 11/18/2017  . Cellulitis of right ankle 11/18/2017  . Sepsis ([place]) 11/18/2017  . Adrenal insufficiency ([place]) 07/01/2017  . Bulging of intervertebral  disc between L4 and L5 07/01/2017  . At high risk for bleeding 07/01/2017  . Right buttock pain 02/08/2017  . Leukocytosis 10/12/2016  . HLD (hyperlipidemia) 04/20/2016  . BPH (benign prostatic hyperplasia) 12/08/2015  . CKD (chronic kidney disease) stage 3, GFR 30-59 ml/min (HCC) 11/27/2015  . S/P total knee arthroplasty 07/13/2015  . OA (osteoarthritis) of knee 06/29/2015  . Ectropion 05/06/2015  . Lateral meniscal tear 02/05/2015  . Hyponatremia 10/20/2013  . Weakness 10/19/2013  . Hypertension   . 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 Hypothyroidism (acquired)   . Anemia, B12 deficiency   . Meibomian gland disease 08/06/2013  . Primary open angle glaucoma 06/19/2013  .  Arthritis of shoulder region, right 12/15/2011  . Exposure keratitis 08/17/2011  . Dry eye 07/13/2011   Past Medical History:  Diagnosis Date  . Allergic rhinitis    Prior allergy shots 20 years  . Anemia   . Anemia, B12 deficiency 2000  . Arthritis    Status post left total replacement 8 2011  . BPH (benign prostatic hyperplasia) 2013  . Cancer ([place])    renal cell ca and skin cancer   . Colitis 2014  . Difficult intubation 1994   surgery had to be  stopped due to injury to "throat"  . Difficult intubation 1994   no trouble since.  Required nasotracheal intubation  '02 [facility] / ANESTHESIA RECORD FROM 2013 AND 2016 IN EPIC  . GERD (gastroesophageal reflux disease)   . Glaucoma 2007   Status post left trabeculectomy 2007  . History of colon polyps    Colonoscopy 2001  . History of kidney stones   . History of renal cell carcinoma 1994   Status post right nephrectomy  . Hyperlipidemia 2003  . Hypertension   . 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 Hypothyroidism (acquired) 2000  . Nocturia   . Osteomyelitis ([place])    right great  . Pituitary mass ([place]) 2000   S/p transphenoidal excision 05/1999 ([facility])  . Vitamin D deficiency 2009    Family History  Problem Relation Age of Onset  . Lung cancer Father   . Anesthesia problems Neg Hx   . Hypotension Neg Hx   . Malignant hyperthermia Neg Hx   . Pseudochol deficiency Neg Hx     Past Surgical History:  Procedure Laterality Date  . AMPUTATION Right 12/20/2017   Procedure: RIGHT 1ST RAY AMPUTATION;  Surgeon: [name], MD;  Location: [facility];  Service: Orthopedics;  Laterality: Right;  . BRAIN SURGERY  2000   pituatary gland removed .  Marland Kitchen CHOLECYSTECTOMY  1984  . Ectropion surgery Bilateral 2006  . EYE SURGERY  over last 6 yrs.     trabeculectomy...   . EYE SURGERY   cat ext ou  . JOINT REPLACEMENT  2011   knee left  . KNEE ARTHROSCOPY Right 02/06/2015   Procedure:  RIGHT ARTHROSCOPY KNEE WITH LATERAL MENSICAL  DEBRIDEMENT;  Surgeon: Gaynelle Arabian, MD;  [facility];  Service: Orthopedics;  Laterality: Right;  . Mohs procedure      for skin cancer on nose   . NEPHRECTOMY Right 1994   Renal cell  . REVERSE SHOULDER ARTHROPLASTY  12/15/2011   Procedure: REVERSE SHOULDER ARTHROPLASTY;  Surgeon: Marin Shutter, MD;  Location: [facility];  Service: Orthopedics;  Laterality: Right;  right total reverse shoulder  . TONSILLECTOMY    . TOTAL KNEE ARTHROPLASTY Right 06/29/2015  Procedure: TOTAL KNEE ARTHROPLASTY;  Surgeon: Gaynelle Arabian, MD;  [facility];  Service: Orthopedics;  Laterality: Right;  . Transsphenoidal excision pituitary tumor  05/1999   A.Tommi Rumps, M.D.([facility])   Social History   Occupational History  . Not on file  Tobacco Use  . Smoking status: Former Smoker    Types: Pipe    Last attempt to quit: 09/26/1988    Years since quitting: 29.3  . Smokeless tobacco: Never Used  . Tobacco comment: about 20 years  Substance and Sexual Activity  . Alcohol use: Yes    Comment: 1 ounces of vodka nightly   . Drug use: No  . Sexual activity: Not Currently

## 2018-02-06 ENCOUNTER — Telehealth (INDEPENDENT_AMBULATORY_CARE_PROVIDER_SITE_OTHER): Payer: Self-pay | Admitting: Orthopedic Surgery

## 2018-02-06 NOTE — Telephone Encounter (Signed)
Patient called having some concerns post surgery and wanted to ask you a few questions. CB # [phone]

## 2018-02-07 DIAGNOSIS — Z85828 Personal history of other malignant neoplasm of skin: Secondary | ICD-10-CM | POA: Diagnosis not present

## 2018-02-07 DIAGNOSIS — L821 Other seborrheic keratosis: Secondary | ICD-10-CM | POA: Diagnosis not present

## 2018-02-07 DIAGNOSIS — L570 Actinic keratosis: Secondary | ICD-10-CM | POA: Diagnosis not present

## 2018-02-07 NOTE — Telephone Encounter (Signed)
Pt called back to speak with you he couldn't find his phone

## 2018-02-07 NOTE — Telephone Encounter (Signed)
I tried to call the pt no answer. Will hold and try again later.

## 2018-02-08 NOTE — Telephone Encounter (Signed)
I called [name] [name] and [name] [name] advised that [name] [name] is having glute pain with radicular s/s down the thigh to his knee. [name] [name] asked if there was anything that [name] [name] could do for this. [name] [name] has lumbar pathology and has been seen by Dr. Wynelle Link for this. [name] [name] has also had ESIs with Dr. Nelva Bush. I advised that [name] [name] should call their office to see what options are or we would be happy to see him here in the office to discuss. [name] [name] states that [name] [name] will call Dr. Wynelle Link and call with any other questions

## 2018-02-09 ENCOUNTER — Telehealth: Payer: Self-pay | Admitting: *Deleted

## 2018-02-09 NOTE — Telephone Encounter (Signed)
Katharine Look with [facility] called and stated that she was seeing patient in [facility] and patient was complaining of Knee pain. Patient is wondering if you could call him in a Rx for Voltaren Gel. Nurse stated that patient has a bulging disk and pain is radiating down to knee. Would like sent to [facility]. Please Advise.

## 2018-02-09 NOTE — Telephone Encounter (Signed)
Spoke with Katharine Look and she called [facility] and he said to use Biofreeze and or Voltaren gel, he was supposed to send this in?

## 2018-02-09 NOTE — Telephone Encounter (Signed)
If he did not get something from Dr. Sharol Given, the voltaren gel qid to his affected leg is fine to be sent to [facility].  He should be seen to evaluate this pain if the gel is not providing relief.

## 2018-02-09 NOTE — Telephone Encounter (Signed)
Spoke with Katharine Look at [facility] and advised that Dr. Mariea Clonts was not in the office today and maybe the ortho provider can give patient something. I also advised that Voltaren Gel is not on patient's med list. Katharine Look was going to call Dr. Sharol Given to see if he can give patient something?

## 2018-02-12 DIAGNOSIS — H401133 Primary open-angle glaucoma, bilateral, severe stage: Secondary | ICD-10-CM | POA: Diagnosis not present

## 2018-02-12 NOTE — Telephone Encounter (Signed)
Tried calling patient, phone rang, phone picked up, no VM and no one said anything, will try and call again later.

## 2018-02-12 NOTE — Telephone Encounter (Signed)
Spoke with patient and he is using voltaren gel, added to med list

## 2018-02-12 NOTE — Telephone Encounter (Signed)
Can we confirm he got the voltaren?  biofreeze is otc so hopefully he used it if he could not get the voltaren.  I don't see it on the list, but if he called it in literally, it would not be on there.

## 2018-02-22 DIAGNOSIS — E291 Testicular hypofunction: Secondary | ICD-10-CM | POA: Diagnosis not present

## 2018-02-22 DIAGNOSIS — E230 Hypopituitarism: Secondary | ICD-10-CM | POA: Diagnosis not present

## 2018-02-22 DIAGNOSIS — E7800 Pure hypercholesterolemia, unspecified: Secondary | ICD-10-CM | POA: Diagnosis not present

## 2018-02-22 DIAGNOSIS — I10 Essential (primary) hypertension: Secondary | ICD-10-CM | POA: Diagnosis not present

## 2018-03-07 ENCOUNTER — Non-Acute Institutional Stay: Payer: PPO | Admitting: Internal Medicine

## 2018-03-07 ENCOUNTER — Encounter: Payer: Self-pay | Admitting: Internal Medicine

## 2018-03-07 VITALS — BP 120/60 | HR 69 | Temp 98.0°F | Ht 71.0 in | Wt 180.0 lb

## 2018-03-07 DIAGNOSIS — E2740 Unspecified adrenocortical insufficiency: Secondary | ICD-10-CM

## 2018-03-07 DIAGNOSIS — N183 Chronic kidney disease, stage 3 unspecified: Secondary | ICD-10-CM

## 2018-03-07 DIAGNOSIS — M5126 Other intervertebral disc displacement, lumbar region: Secondary | ICD-10-CM

## 2018-03-07 DIAGNOSIS — E893 Postprocedural hypopituitarism: Secondary | ICD-10-CM | POA: Diagnosis not present

## 2018-03-07 DIAGNOSIS — M5442 Lumbago with sciatica, left side: Secondary | ICD-10-CM | POA: Diagnosis not present

## 2018-03-07 DIAGNOSIS — M5136 Other intervertebral disc degeneration, lumbar region: Secondary | ICD-10-CM

## 2018-03-07 DIAGNOSIS — G8929 Other chronic pain: Secondary | ICD-10-CM | POA: Diagnosis not present

## 2018-03-07 DIAGNOSIS — L97519 Non-pressure chronic ulcer of other part of right foot with unspecified severity: Secondary | ICD-10-CM

## 2018-03-07 NOTE — Progress Notes (Signed)
Location:  Occupational psychologist of Service:  Clinic (12)  Provider: Susumu Hackler L. Mariea Clonts, D.O., C.M.D.  Code Status: DNR Goals of Care:  Advanced Directives 03/07/2018  Does Patient Have a Medical Advance Directive? Yes  Type of Paramedic of [place];Out of facility DNR (pink MOST or yellow form)  Does patient want to make changes to medical advance directive? No - Patient declined  Copy of [place] in Chart? Yes  Would patient like information on creating a medical advance directive? -  Pre-existing out of facility DNR order (yellow form or pink MOST form) Yellow form placed in chart (order not valid for inpatient use);Pink MOST form placed in chart (order not valid for inpatient use)   Chief Complaint  Patient presents with  . Medical Management of Chronic Issues    med mgt    HPI: Patient is a 84 y.o. male seen today for medical management of chronic diseases.    He showed me his amputation site which has healed.  He has a lot of scaly skin over that area.  He has a scaly callus on the tip of his second toe next to it.  It's not painful.    He is most bothered by "this damn sciatica" down his left leg from his disc disease.  He has used voltaren gel and tylenol without success.  It's bothersome when he sits a long time like to watch a movie, for example.  He reports he's going to Dr. Maureen Ralphs and will ask him about it also after I suggested gabapentin or lyrica.    We looked at his labs he had with Dr. Elyse Hsu, his endocrinologist. Pt feels he's getting his tests too often. He is going to get the testosterone and cholesterol done less frequently.   Past Medical History:  Diagnosis Date  . Allergic rhinitis    Prior allergy shots 20 years  . Anemia   . Anemia, B12 deficiency 2000  . Arthritis    Status post left total replacement 8 2011  . BPH (benign prostatic hyperplasia) 2013  . Cancer ([place])    renal cell ca  and skin cancer   . Colitis 2014  . Difficult intubation 1994   surgery had to be  stopped due to injury to "throat"  . Difficult intubation 1994   no trouble since.  Required nasotracheal intubation  '02 [facility] / ANESTHESIA RECORD FROM 2013 AND 2016 IN EPIC  . GERD (gastroesophageal reflux disease)   . Glaucoma 2007   Status post left trabeculectomy 2007  . History of colon polyps    Colonoscopy 2001  . History of kidney stones   . History of renal cell carcinoma 1994   Status post right nephrectomy  . Hyperlipidemia 2003  . Hypertension   . 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 Hypothyroidism (acquired) 2000  . Nocturia   . Osteomyelitis ([place])    right great  . Pituitary mass ([place]) 2000   S/p transphenoidal excision 05/1999 ([facility])  . Vitamin D deficiency 2009    Past Surgical History:  Procedure Laterality Date  . AMPUTATION Right 12/20/2017   Procedure: RIGHT 1ST RAY AMPUTATION;  Surgeon: [name], MD;  Location: [place];  Service: Orthopedics;  Laterality: Right;  . BRAIN SURGERY  2000   pituatary gland removed .  Marland Kitchen CHOLECYSTECTOMY  1984  . Ectropion surgery Bilateral 2006  . EYE SURGERY  over last 6 yrs.     trabeculectomy...   . EYE SURGERY   cat ext ou  . JOINT REPLACEMENT  2011   knee left  . KNEE ARTHROSCOPY Right 02/06/2015   Procedure: RIGHT ARTHROSCOPY KNEE WITH LATERAL MENSICAL  DEBRIDEMENT;  Surgeon: Gaynelle Arabian, MD;  Location: [facility];  Service: Orthopedics;  Laterality: Right;  . Mohs procedure      for skin cancer on nose   . NEPHRECTOMY Right 1994   Renal cell  . REVERSE SHOULDER ARTHROPLASTY  12/15/2011   Procedure: REVERSE SHOULDER ARTHROPLASTY;  Surgeon: Marin Shutter, MD;  Location: [place];  Service: Orthopedics;  Laterality: Right;  right total reverse shoulder  . TONSILLECTOMY    . TOTAL KNEE ARTHROPLASTY Right 06/29/2015   Procedure: TOTAL KNEE ARTHROPLASTY;  Surgeon: Gaynelle Arabian, MD;   Location: [facility];  Service: Orthopedics;  Laterality: Right;  . Transsphenoidal excision pituitary tumor  05/1999   A.Tommi Rumps, M.D.([facility])    Allergies  Allergen Reactions  . Adhesive [Tape] Other (See Comments)    CAUSES SKIN TEARS, prefers paper tape   . Percocet [Oxycodone-Acetaminophen]   . Robaxin [Methocarbamol]     UNSPECIFIED REACTION   . Diovan [Valsartan] Rash    Outpatient Encounter Medications as of 03/07/2018  Medication Sig  . acetaminophen (TYLENOL) 500 MG tablet Take 500 mg by mouth daily as needed for fever.   . Cholecalciferol (VITAMIN D) 2000 units tablet Take 2,000 Units by mouth daily.  . Coenzyme Q10 300 MG CAPS Take 300 mg by mouth daily.   . diclofenac sodium (VOLTAREN) 1 % GEL Apply 2 g topically 4 (four) times daily.  Marland Kitchen docusate sodium (COLACE) 100 MG capsule Take 200 mg by mouth at bedtime.  . finasteride (PROSCAR) 5 MG tablet Take 5 mg by mouth every morning. Take 0.5 by mouth every evening  . Misc Natural Products (FIBER 7) POWD Take 2 Doses by mouth at bedtime.  . Multiple Vitamins-Minerals (MULTIVITAMIN & MINERAL PO) Take 1 tablet by mouth daily.  . Omega-3 Fatty Acids (OMEGA-3 FISH OIL PO) Take 700 mg by mouth daily.  Vladimir Faster Glycol-Propyl Glycol (SYSTANE OP) Place 1 drop into both eyes daily as needed (dry eyes).  . pravastatin (PRAVACHOL) 80 MG tablet Take 80 mg by mouth every evening.   . predniSONE (DELTASONE) 5 MG tablet Take 5 mg by mouth every morning. 2.5 mg every evening  . SYNTHROID 88 MCG tablet Take 88 mcg by mouth daily before breakfast.   . testosterone cypionate (DEPOTESTOSTERONE CYPIONATE) 200 MG/ML injection Inject 30 mg (0.15 ml) intramuscularly weekly. Must discard vial after 90 days.  . vitamin B-12 (CYANOCOBALAMIN) 500 MCG tablet Take 500 mcg by mouth daily.  . [DISCONTINUED] Psyllium (VEGETABLE LAXATIVE PO) Take 2 tablets by mouth at bedtime.  . [DISCONTINUED] HYDROcodone-acetaminophen (NORCO/VICODIN) 5-325 MG tablet Take 1  tablet by mouth every 4 (four) hours as needed for moderate pain.  . [DISCONTINUED] lactobacillus acidophilus & bulgar (LACTINEX) chewable tablet Chew 1 tablet by mouth 3 (three) times daily with meals.   No facility-administered encounter medications on file as of 03/07/2018.     Review of Systems:  Review of Systems  Constitutional: Negative for chills, fever and malaise/fatigue.  HENT: Positive for hearing loss. Negative for congestion.   Eyes: Negative for blurred vision.       Glasses  Respiratory: Negative for cough and shortness of breath.   Cardiovascular: Positive for leg swelling. Negative for chest pain and palpitations.  Edema better with compression socks  Gastrointestinal: Positive for constipation. Negative for abdominal pain, blood in stool, diarrhea and melena.  Genitourinary: Negative for dysuria.  Musculoskeletal: Positive for back pain. Negative for falls and joint pain.  Skin: Negative for itching and rash.       Callous on second toe next to amputation site  Neurological: Negative for dizziness and loss of consciousness.  Endo/Heme/Allergies: Bruises/bleeds easily.  Psychiatric/Behavioral: Negative for depression. The patient is not nervous/anxious and does not have insomnia.        Some short term memory loss at times    Health Maintenance  Topic Date Due  . FOOT EXAM  01/22/1933  . OPHTHALMOLOGY EXAM  01/22/1933  . URINE MICROALBUMIN  01/22/1933  . TETANUS/TDAP  10/27/2008  . INFLUENZA VACCINE  03/29/2018  . HEMOGLOBIN A1C  05/21/2018  . PNA vac Low Risk Adult  Completed    Physical Exam: Vitals:   03/07/18 1323  BP: 120/60  Pulse: 69  Temp: 98 F (36.7 C)  TempSrc: Oral  SpO2: 96%  Weight: 180 lb (81.6 kg)  Height: 5\' 11"  (1.803 m)   Body mass index is 25.1 kg/m. Physical Exam  Constitutional: He is oriented to person, place, and time. He appears well-developed and well-nourished. No distress.  Cardiovascular: Normal rate, regular  rhythm, normal heart sounds and intact distal pulses.  Edema improved with compression hose  Pulmonary/Chest: Effort normal and breath sounds normal. No respiratory distress.  Abdominal: Soft. Bowel sounds are normal. He exhibits no distension. There is no tenderness.  Musculoskeletal:  Walks with limp due to back pain, uses cane and scooter to get around  Neurological: He is alert and oriented to person, place, and time.  Skin: Skin is warm and dry.  Thick callus on end of 2nd toe (appears it was a blood blister originally), no erythema, warmth or tenderness; transmetatarsal amputation of 1st toe healed    Labs reviewed: Basic Metabolic Panel: Recent Labs    11/29/17 1210 11/30/17 0329 12/01/17 0330 12/20/17 1127  NA 134* 140  --  136  K 3.7 3.7  --  4.0  CL 100* 106  --  101  CO2 26 27  --  26  GLUCOSE 88 128*  --  116*  BUN 20 20  --  24*  CREATININE 1.31* 1.27* 1.64* 1.29*  CALCIUM 8.4* 8.5*  --  9.7   Liver Function Tests: Recent Labs    06/12/17 11/29/17 1210  AST 13* 15  ALT 14 13*  ALKPHOS 46 51  BILITOT  --  1.3*  PROT  --  5.5*  ALBUMIN  --  2.7*   No results for input(s): LIPASE, AMYLASE in the last 8760 hours. No results for input(s): AMMONIA in the last 8760 hours. CBC: Recent Labs    11/17/17 2326  11/29/17 1210 11/30/17 0329 12/20/17 1127  WBC 14.7*   < > 30.5* 17.1* 16.0*  NEUTROABS 11.7*  --  26.5*  --   --   HGB 10.9*   < > 10.9* 10.1* 12.7*  HCT 33.2*   < > 32.8* 30.9* 39.2  MCV 97.1   < > 98.8 99.7 98.5  PLT 270   < > 227 211 285   < > = values in this interval not displayed.   Lipid Panel: Recent Labs    06/12/17  CHOL 130  HDL 62  LDLCALC 65  TRIG 70   Lab Results  Component Value Date   HGBA1C 6.2 (  H) 11/18/2017    Assessment/Plan 1. Chronic left-sided low back pain with left-sided sciatica Gabapentin or lyrica for left sciatica--he wants to check with Dr. Wynelle Link first  2. Bulging of intervertebral disc between L4 and  L5 -see #1  3. Ulcer of toe of right foot, unspecified ulcer stage ([place]) -Call if second toe callus breaks down or opens, changes in any way  4. Adrenal insufficiency (H[facility]) -cont steroids lifelong and adjust for infection  5. 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 Elyse Hsu  6. CKD (chronic kidney disease) stage 3, GFR 30-59 ml/min ([facility]) -renal function stable Avoid nephrotoxic agents like nsaids, dose adjust renally excreted meds, hydrate.  Labs/tests ordered:  No orders of the defined types were placed in this encounter. gets labs at endocrine  Next appt:  4 mos med mgt  Seanne Chirico L. Zlatan Hornback, D.O. [facility] [address], [place] [address] Cell Phone (Mon-Fri 8am-5pm):  [phone] On Call:  [phone] & follow prompts after 5pm & weekends Office Phone:  [phone] Office Fax:  [phone]

## 2018-03-12 DIAGNOSIS — H401133 Primary open-angle glaucoma, bilateral, severe stage: Secondary | ICD-10-CM | POA: Diagnosis not present

## 2018-03-12 DIAGNOSIS — H52203 Unspecified astigmatism, bilateral: Secondary | ICD-10-CM | POA: Diagnosis not present

## 2018-03-12 DIAGNOSIS — H5213 Myopia, bilateral: Secondary | ICD-10-CM | POA: Diagnosis not present

## 2018-03-12 DIAGNOSIS — H524 Presbyopia: Secondary | ICD-10-CM | POA: Diagnosis not present

## 2018-03-14 DIAGNOSIS — L718 Other rosacea: Secondary | ICD-10-CM | POA: Diagnosis not present

## 2018-03-14 DIAGNOSIS — H16213 Exposure keratoconjunctivitis, bilateral: Secondary | ICD-10-CM | POA: Diagnosis not present

## 2018-03-14 DIAGNOSIS — H02889 Meibomian gland dysfunction of unspecified eye, unspecified eyelid: Secondary | ICD-10-CM | POA: Diagnosis not present

## 2018-03-14 DIAGNOSIS — H401133 Primary open-angle glaucoma, bilateral, severe stage: Secondary | ICD-10-CM | POA: Diagnosis not present

## 2018-03-16 DIAGNOSIS — M545 Low back pain: Secondary | ICD-10-CM | POA: Diagnosis not present

## 2018-03-19 ENCOUNTER — Telehealth (INDEPENDENT_AMBULATORY_CARE_PROVIDER_SITE_OTHER): Payer: Self-pay | Admitting: Orthopedic Surgery

## 2018-03-19 NOTE — Telephone Encounter (Signed)
Patient requested to speak with [name] [name] to talk about his foot Dr. Sharol Given has done surgery on. He asks that someone give him a call back when possible. Patients # [phone]

## 2018-03-19 NOTE — Telephone Encounter (Signed)
appt made for patient to be seen tomorrow.

## 2018-03-20 ENCOUNTER — Other Ambulatory Visit: Payer: Self-pay

## 2018-03-20 ENCOUNTER — Ambulatory Visit (INDEPENDENT_AMBULATORY_CARE_PROVIDER_SITE_OTHER): Payer: PPO

## 2018-03-20 ENCOUNTER — Encounter (HOSPITAL_COMMUNITY): Payer: Self-pay | Admitting: *Deleted

## 2018-03-20 ENCOUNTER — Ambulatory Visit (INDEPENDENT_AMBULATORY_CARE_PROVIDER_SITE_OTHER): Payer: PPO | Admitting: Orthopedic Surgery

## 2018-03-20 ENCOUNTER — Encounter (HOSPITAL_COMMUNITY): Payer: Self-pay | Admitting: General Practice

## 2018-03-20 ENCOUNTER — Inpatient Hospital Stay (HOSPITAL_COMMUNITY)
Admission: AD | Admit: 2018-03-20 | Discharge: 2018-03-24 | DRG: 617 | Disposition: A | Discharge status: Skilled Nursing Facility | Payer: PPO | Attending: Internal Medicine | Admitting: Internal Medicine

## 2018-03-20 ENCOUNTER — Encounter (INDEPENDENT_AMBULATORY_CARE_PROVIDER_SITE_OTHER): Payer: Self-pay | Admitting: Orthopedic Surgery

## 2018-03-20 ENCOUNTER — Inpatient Hospital Stay (HOSPITAL_COMMUNITY): Payer: PPO

## 2018-03-20 VITALS — BP 157/80 | HR 77 | Temp 98.1°F

## 2018-03-20 DIAGNOSIS — E785 Hyperlipidemia, unspecified: Secondary | ICD-10-CM | POA: Diagnosis not present

## 2018-03-20 DIAGNOSIS — E2740 Unspecified adrenocortical insufficiency: Secondary | ICD-10-CM | POA: Diagnosis present

## 2018-03-20 DIAGNOSIS — I129 Hypertensive chronic kidney disease with stage 1 through stage 4 chronic kidney disease, or unspecified chronic kidney disease: Secondary | ICD-10-CM | POA: Diagnosis not present

## 2018-03-20 DIAGNOSIS — E893 Postprocedural hypopituitarism: Secondary | ICD-10-CM | POA: Diagnosis present

## 2018-03-20 DIAGNOSIS — Z885 Allergy status to narcotic agent status: Secondary | ICD-10-CM

## 2018-03-20 DIAGNOSIS — M869 Osteomyelitis, unspecified: Secondary | ICD-10-CM | POA: Diagnosis not present

## 2018-03-20 DIAGNOSIS — Z96653 Presence of artificial knee joint, bilateral: Secondary | ICD-10-CM | POA: Diagnosis not present

## 2018-03-20 DIAGNOSIS — Z7952 Long term (current) use of systemic steroids: Secondary | ICD-10-CM

## 2018-03-20 DIAGNOSIS — Z888 Allergy status to other drugs, medicaments and biological substances status: Secondary | ICD-10-CM

## 2018-03-20 DIAGNOSIS — Z87891 Personal history of nicotine dependence: Secondary | ICD-10-CM

## 2018-03-20 DIAGNOSIS — I351 Nonrheumatic aortic (valve) insufficiency: Secondary | ICD-10-CM

## 2018-03-20 DIAGNOSIS — Z91048 Other nonmedicinal substance allergy status: Secondary | ICD-10-CM

## 2018-03-20 DIAGNOSIS — M8600 Acute hematogenous osteomyelitis, unspecified site: Secondary | ICD-10-CM | POA: Diagnosis not present

## 2018-03-20 DIAGNOSIS — Z85528 Personal history of other malignant neoplasm of kidney: Secondary | ICD-10-CM

## 2018-03-20 DIAGNOSIS — Z86018 Personal history of other benign neoplasm: Secondary | ICD-10-CM

## 2018-03-20 DIAGNOSIS — Z89411 Acquired absence of right great toe: Secondary | ICD-10-CM

## 2018-03-20 DIAGNOSIS — Z905 Acquired absence of kidney: Secondary | ICD-10-CM | POA: Diagnosis not present

## 2018-03-20 DIAGNOSIS — M86271 Subacute osteomyelitis, right ankle and foot: Secondary | ICD-10-CM

## 2018-03-20 DIAGNOSIS — E039 Hypothyroidism, unspecified: Secondary | ICD-10-CM | POA: Diagnosis present

## 2018-03-20 DIAGNOSIS — Z89421 Acquired absence of other right toe(s): Secondary | ICD-10-CM | POA: Diagnosis not present

## 2018-03-20 DIAGNOSIS — Z85828 Personal history of other malignant neoplasm of skin: Secondary | ICD-10-CM

## 2018-03-20 DIAGNOSIS — D631 Anemia in chronic kidney disease: Secondary | ICD-10-CM | POA: Diagnosis not present

## 2018-03-20 DIAGNOSIS — N183 Chronic kidney disease, stage 3 (moderate): Secondary | ICD-10-CM | POA: Diagnosis not present

## 2018-03-20 DIAGNOSIS — E1169 Type 2 diabetes mellitus with other specified complication: Principal | ICD-10-CM | POA: Diagnosis present

## 2018-03-20 DIAGNOSIS — L03115 Cellulitis of right lower limb: Secondary | ICD-10-CM | POA: Diagnosis not present

## 2018-03-20 DIAGNOSIS — N400 Enlarged prostate without lower urinary tract symptoms: Secondary | ICD-10-CM | POA: Diagnosis not present

## 2018-03-20 DIAGNOSIS — E559 Vitamin D deficiency, unspecified: Secondary | ICD-10-CM | POA: Diagnosis not present

## 2018-03-20 DIAGNOSIS — E538 Deficiency of other specified B group vitamins: Secondary | ICD-10-CM | POA: Diagnosis present

## 2018-03-20 DIAGNOSIS — M7918 Myalgia, other site: Secondary | ICD-10-CM | POA: Diagnosis present

## 2018-03-20 DIAGNOSIS — M7989 Other specified soft tissue disorders: Secondary | ICD-10-CM | POA: Diagnosis not present

## 2018-03-20 DIAGNOSIS — Z79890 Hormone replacement therapy (postmenopausal): Secondary | ICD-10-CM | POA: Diagnosis not present

## 2018-03-20 DIAGNOSIS — Z96611 Presence of right artificial shoulder joint: Secondary | ICD-10-CM | POA: Diagnosis present

## 2018-03-20 DIAGNOSIS — S98131A Complete traumatic amputation of one right lesser toe, initial encounter: Secondary | ICD-10-CM

## 2018-03-20 DIAGNOSIS — E1122 Type 2 diabetes mellitus with diabetic chronic kidney disease: Secondary | ICD-10-CM | POA: Diagnosis present

## 2018-03-20 DIAGNOSIS — E1140 Type 2 diabetes mellitus with diabetic neuropathy, unspecified: Secondary | ICD-10-CM | POA: Diagnosis not present

## 2018-03-20 LAB — COMPREHENSIVE METABOLIC PANEL
ALBUMIN: 3.2 g/dL — AB (ref 3.5–5.0)
ALT: 15 U/L (ref 0–44)
ANION GAP: 9 (ref 5–15)
AST: 17 U/L (ref 15–41)
Alkaline Phosphatase: 49 U/L (ref 38–126)
BILIRUBIN TOTAL: 0.8 mg/dL (ref 0.3–1.2)
BUN: 16 mg/dL (ref 8–23)
CO2: 26 mmol/L (ref 22–32)
Calcium: 9.4 mg/dL (ref 8.9–10.3)
Chloride: 100 mmol/L (ref 98–111)
Creatinine, Ser: 1.28 mg/dL — ABNORMAL HIGH (ref 0.61–1.24)
GFR, EST AFRICAN AMERICAN: 54 mL/min — AB (ref >60)
GFR, EST NON AFRICAN AMERICAN: 46 mL/min — AB (ref >60)
GLUCOSE: 85 mg/dL (ref 70–99)
POTASSIUM: 4.1 mmol/L (ref 3.5–5.1)
Sodium: 135 mmol/L (ref 135–145)
TOTAL PROTEIN: 6.5 g/dL (ref 6.5–8.1)

## 2018-03-20 LAB — MAGNESIUM: MAGNESIUM: 2 mg/dL (ref 1.7–2.4)

## 2018-03-20 LAB — TROPONIN I

## 2018-03-20 LAB — CBC
HEMATOCRIT: 39.3 % (ref 39.0–52.0)
Hemoglobin: 12.8 g/dL — ABNORMAL LOW (ref 13.0–17.0)
MCH: 32.2 pg (ref 26.0–34.0)
MCHC: 32.6 g/dL (ref 30.0–36.0)
MCV: 98.7 fL (ref 78.0–100.0)
Platelets: 254 10*3/uL (ref 150–400)
RBC: 3.98 MIL/uL — ABNORMAL LOW (ref 4.22–5.81)
RDW: 14.4 % (ref 11.5–15.5)
WBC: 14 10*3/uL — ABNORMAL HIGH (ref 4.0–10.5)

## 2018-03-20 LAB — MRSA PCR SCREENING: MRSA by PCR: NEGATIVE

## 2018-03-20 LAB — PROCALCITONIN

## 2018-03-20 LAB — CORTISOL: Cortisol, Plasma: 1 ug/dL

## 2018-03-20 LAB — GLUCOSE, CAPILLARY
Glucose-Capillary: 71 mg/dL (ref 70–99)
Glucose-Capillary: 104 mg/dL — ABNORMAL HIGH (ref 70–99)

## 2018-03-20 LAB — TSH: TSH: <0.01 u[IU]/mL — ABNORMAL LOW (ref 0.350–4.500)

## 2018-03-20 LAB — HEMOGLOBIN A1C
HEMOGLOBIN A1C: 6.5 % — AB (ref 4.8–5.6)
MEAN PLASMA GLUCOSE: 139.85 mg/dL

## 2018-03-20 LAB — LACTIC ACID, PLASMA: LACTIC ACID, VENOUS: 1 mmol/L (ref 0.5–1.9)

## 2018-03-20 MED ORDER — ACETAMINOPHEN 650 MG RE SUPP: 650.0000 mg | Freq: Four times a day (QID) | RECTAL | Status: DC | PRN

## 2018-03-20 MED ORDER — PRAVASTATIN SODIUM 40 MG PO TABS
80.0000 mg | ORAL_TABLET | Freq: Every evening | ORAL | Status: DC
  Administered 2018-03-20 – 2018-03-23 (×4): 80 mg via ORAL
  Filled 2018-03-20 (×5): qty 2

## 2018-03-20 MED ORDER — PIPERACILLIN-TAZOBACTAM 3.375 G IVPB
3.3750 g | Freq: Three times a day (TID) | INTRAVENOUS | Status: DC
  Administered 2018-03-20 – 2018-03-24 (×10): 3.375 g via INTRAVENOUS
  Filled 2018-03-20 (×13): qty 50

## 2018-03-20 MED ORDER — VANCOMYCIN HCL IN DEXTROSE 750-5 MG/150ML-% IV SOLN
750.0000 mg | INTRAVENOUS | Status: DC
  Administered 2018-03-21: 750 mg via INTRAVENOUS
  Filled 2018-03-20: qty 150

## 2018-03-20 MED ORDER — INSULIN ASPART 100 UNIT/ML ~~LOC~~ SOLN
0.0000 [IU] | Freq: Three times a day (TID) | SUBCUTANEOUS | Status: DC
  Administered 2018-03-21: 3 [IU] via SUBCUTANEOUS
  Administered 2018-03-22: 2 [IU] via SUBCUTANEOUS
  Administered 2018-03-23: 1 [IU] via SUBCUTANEOUS

## 2018-03-20 MED ORDER — ENSURE ENLIVE PO LIQD
237.0000 mL | Freq: Two times a day (BID) | ORAL | Status: DC
  Administered 2018-03-20 – 2018-03-22 (×5): 237 mL via ORAL

## 2018-03-20 MED ORDER — ACETAMINOPHEN 325 MG PO TABS
650.0000 mg | ORAL_TABLET | Freq: Four times a day (QID) | ORAL | Status: DC | PRN
  Administered 2018-03-24: 650 mg via ORAL
  Filled 2018-03-20 (×2): qty 2

## 2018-03-20 MED ORDER — FINASTERIDE 5 MG PO TABS
5.0000 mg | ORAL_TABLET | Freq: Every morning | ORAL | Status: DC
  Administered 2018-03-21 – 2018-03-24 (×3): 5 mg via ORAL
  Filled 2018-03-20 (×3): qty 1

## 2018-03-20 MED ORDER — LEVOTHYROXINE SODIUM 88 MCG PO TABS
88.0000 ug | ORAL_TABLET | Freq: Every day | ORAL | Status: DC
  Administered 2018-03-21 – 2018-03-24 (×3): 88 ug via ORAL
  Filled 2018-03-20 (×3): qty 1

## 2018-03-20 MED ORDER — POLYETHYLENE GLYCOL 3350 17 G PO PACK: 17.0000 g | PACK | Freq: Every day | ORAL | Status: DC | PRN

## 2018-03-20 MED ORDER — ACETAMINOPHEN 500 MG PO TABS: 500.0000 mg | ORAL_TABLET | Freq: Every day | ORAL | Status: DC | PRN

## 2018-03-20 MED ORDER — ONDANSETRON HCL 4 MG PO TABS: 4.0000 mg | ORAL_TABLET | Freq: Four times a day (QID) | ORAL | Status: DC | PRN

## 2018-03-20 MED ORDER — ONDANSETRON HCL 4 MG/2ML IJ SOLN
4.0000 mg | Freq: Four times a day (QID) | INTRAMUSCULAR | Status: DC | PRN
  Administered 2018-03-21 – 2018-03-23 (×3): 4 mg via INTRAVENOUS
  Filled 2018-03-20 (×2): qty 2

## 2018-03-20 MED ORDER — VANCOMYCIN HCL 10 G IV SOLR
1500.0000 mg | Freq: Once | INTRAVENOUS | Status: AC
  Administered 2018-03-20: 1500 mg via INTRAVENOUS
  Filled 2018-03-20: qty 1500

## 2018-03-20 MED ORDER — HEPARIN SODIUM (PORCINE) 5000 UNIT/ML IJ SOLN
5000.0000 [IU] | Freq: Three times a day (TID) | INTRAMUSCULAR | Status: DC
  Administered 2018-03-20 – 2018-03-24 (×9): 5000 [IU] via SUBCUTANEOUS
  Filled 2018-03-20 (×11): qty 1

## 2018-03-20 MED ORDER — CYANOCOBALAMIN 500 MCG PO TABS
500.0000 ug | ORAL_TABLET | Freq: Every day | ORAL | Status: DC
Start: 2018-03-21 — End: 2018-03-24
  Administered 2018-03-21 – 2018-03-22 (×2): 500 ug via ORAL
  Filled 2018-03-20 (×4): qty 1

## 2018-03-20 MED ORDER — PREDNISONE 5 MG PO TABS
5.0000 mg | ORAL_TABLET | Freq: Every morning | ORAL | Status: DC
  Administered 2018-03-21 – 2018-03-24 (×3): 5 mg via ORAL
  Filled 2018-03-20 (×3): qty 1

## 2018-03-20 NOTE — H&P (Signed)
[facility] History and Physical  [employer]. BWL:[id number] DOB: 1923/05/14 DOA: 03/20/2018  Referring physician  PCP: [name], DO   Chief Complaint:    HPI:   84 year old [name] [name] with [name] history of panhypopituitarism, renal cell carcinoma,recent right great toe and first metatarsal head amputation in April 2019, who presents to the office of Dr. Sharol Given with complaints of right lower extremity swelling, erythema, chills, low-grade fever, myalgias since Thursday. Patient noticed drainage and redness from his second right toe,and redness moving up his foot into his right lower extremity. He also noticed worsening dependent edema and swelling. He started experiencing low-grade fever of around 100 degrees Fahrenheit which  he has been trying to keep under control with prn Tylenol.patient also complains of chills, rigors, during this interview. He also complains of being queasy and nauseous. He denies any chest pain or shortness of breath. On arrival temperature 90.8, pulse 79, blood pressure 156/7098% on room air EKG shows first-degree AV block, sinus tachycardia Labs are pending Patient had x-rays done and Dr. Jess Barters office that confirmed osteomyelitis  In the second toe of his right foot. Dr. Sharol Given is to operate on him on Friday according to the patient Patient is being admitted for osteomyelitis of right second toe, possible sepsis      Review of Systems: negative for the following  Constitutional: positive for fever, chills, diaphoresis, appetite change and fatigue.  HEENT: Denies photophobia, eye pain, redness, hearing loss, ear pain, congestion, sore throat, rhinorrhea, sneezing, mouth sores, trouble swallowing, neck pain, neck stiffness and tinnitus.  Respiratory: Denies SOB, DOE, cough, chest tightness, and wheezing.  Cardiovascular: Denies chest pain, palpitations and leg swelling.  Gastrointestinal: Denies nausea, vomiting, abdominal pain, diarrhea, constipation, blood  in stool and abdominal distention.  Genitourinary: Denies dysuria, urgency, frequency, hematuria, flank pain and difficulty urinating.  Musculoskeletal: Denies myalgias, back pain, redness and joint swelling off right toe, arthralgias and gait problem.  Skin: Denies pallor, rash and wound.  Neurological: Denies dizziness, seizures, syncope, weakness, light-headedness, numbness and headaches.  Hematological: Denies adenopathy. Easy bruising, personal or family bleeding history  Psychiatric/Behavioral: Denies suicidal ideation, mood changes, confusion, nervousness, sleep disturbance and agitation       Past Medical History:  Diagnosis Date  . Allergic rhinitis    Prior allergy shots 20 years  . Anemia   . Anemia, B12 deficiency 2000  . Arthritis    Status post left total replacement 8 2011  . BPH (benign prostatic hyperplasia) 2013  . Cancer ([place])    renal cell ca and skin cancer   . Colitis 2014  . Difficult intubation 1994   surgery had to be  stopped due to injury to "throat"  . Difficult intubation 1994   no trouble since.  Required nasotracheal intubation  '02 [facility] / ANESTHESIA RECORD FROM 2013 AND 2016 IN EPIC  . GERD (gastroesophageal reflux disease)   . Glaucoma 2007   Status post left trabeculectomy 2007  . History of colon polyps    Colonoscopy 2001  . History of kidney stones   . History of renal cell carcinoma 1994   Status post right nephrectomy  . Hyperlipidemia 2003  . Hypertension   . 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 Hypothyroidism (acquired) 2000  . Nocturia   . Osteomyelitis ([place])    right great  . Pituitary mass ([place]) 2000   S/p transphenoidal excision 05/1999 ([facility])  . Vitamin D deficiency  2009     Past Surgical History:  Procedure Laterality Date  . AMPUTATION Right 12/20/2017   Procedure: RIGHT 1ST RAY AMPUTATION;  Surgeon: [name], MD;  Location: [place];  Service: Orthopedics;   Laterality: Right;  . BRAIN SURGERY  2000   pituatary gland removed .  Marland Kitchen CHOLECYSTECTOMY  1984  . Ectropion surgery Bilateral 2006  . EYE SURGERY  over last 6 yrs.     trabeculectomy...   . EYE SURGERY   cat ext ou  . JOINT REPLACEMENT  2011   knee left  . KNEE ARTHROSCOPY Right 02/06/2015   Procedure: RIGHT ARTHROSCOPY KNEE WITH LATERAL MENSICAL  DEBRIDEMENT;  Surgeon: Gaynelle Arabian, MD;  [facility];  Service: Orthopedics;  Laterality: Right;  . Mohs procedure      for skin cancer on nose   . NEPHRECTOMY Right 1994   Renal cell  . REVERSE SHOULDER ARTHROPLASTY  12/15/2011   Procedure: REVERSE SHOULDER ARTHROPLASTY;  Surgeon: Marin Shutter, MD;  Location: [place];  Service: Orthopedics;  Laterality: Right;  right total reverse shoulder  . TONSILLECTOMY    . TOTAL KNEE ARTHROPLASTY Right 06/29/2015   Procedure: TOTAL KNEE ARTHROPLASTY;  Surgeon: Gaynelle Arabian, MD;  [facility];  Service: Orthopedics;  Laterality: Right;  . Transsphenoidal excision pituitary tumor  05/1999   [name].Tommi Rumps, M.D.([facility])      Social History:  reports that he quit smoking about 29 years ago. His smoking use included pipe. He has never used smokeless tobacco. He reports that he drinks alcohol. He reports that he does not use drugs.    Allergies  Allergen Reactions  . Adhesive [Tape] Other (See Comments)    CAUSES SKIN TEARS, prefers paper tape   . Percocet [Oxycodone-Acetaminophen]   . Robaxin [Methocarbamol]     UNSPECIFIED REACTION   . Diovan [Valsartan] Rash    Family History  Problem Relation Age of Onset  . Lung cancer Father   . Anesthesia problems Neg Hx   . Hypotension Neg Hx   . Malignant hyperthermia Neg Hx   . Pseudochol deficiency Neg Hx         Prior to Admission medications   Medication Sig Start Date End Date Taking? Authorizing Provider  acetaminophen (TYLENOL) 500 MG tablet Take 500 mg by mouth daily as needed for fever.     [provider]  Cholecalciferol  (VITAMIN D) 2000 units tablet Take 2,000 Units by mouth daily.    [provider]  Coenzyme Q10 300 MG CAPS Take 300 mg by mouth daily.     [provider]  diclofenac sodium (VOLTAREN) 1 % GEL Apply 2 g topically 4 (four) times daily.    [provider]  docusate sodium (COLACE) 100 MG capsule Take 200 mg by mouth at bedtime.    [provider]  finasteride (PROSCAR) 5 MG tablet Take 5 mg by mouth every morning. Take 0.5 by mouth every evening    [provider]  Misc Natural Products (FIBER 7) POWD Take 2 Doses by mouth at bedtime.    [provider]  Multiple Vitamins-Minerals (MULTIVITAMIN & MINERAL PO) Take 1 tablet by mouth daily.    [provider]  Omega-3 Fatty Acids (OMEGA-3 FISH OIL PO) Take 700 mg by mouth daily.    [provider]  Polyethyl Glycol-Propyl Glycol (SYSTANE OP) Place 1 drop into both eyes daily as needed (dry eyes).    [provider]  pravastatin (PRAVACHOL) 80  MG tablet Take 80 mg by mouth every evening.     [provider]  predniSONE (DELTASONE) 5 MG tablet Take 5 mg by mouth every morning. 2.5 mg every evening    [provider]  SYNTHROID 88 MCG tablet Take 88 mcg by mouth daily before breakfast.  12/26/16   [provider]  testosterone cypionate (DEPOTESTOSTERONE CYPIONATE) 200 MG/ML injection Inject 30 mg (0.15 ml) intramuscularly weekly. Must discard vial after 90 days. 01/25/17   [provider]  vitamin B-12 (CYANOCOBALAMIN) 500 MCG tablet Take 500 mcg by mouth daily.    [provider]     Physical Exam: Vitals:   03/20/18 1300  BP: (!) 156/70  Pulse: 79  Resp: 15  Temp: 98 F (36.7 C)  TempSrc: Oral  SpO2: 98%        Vitals:   03/20/18 1300  BP: (!) 156/70  Pulse: 79  Resp: 15  Temp: 98 F (36.7 C)  TempSrc: Oral  SpO2: 98%   Constitutional: NAD, calm, comfortable Eyes: PERRL, lids and conjunctivae normal ENMT:  Mucous membranes are moist. Posterior pharynx clear of any exudate or lesions.Normal dentition.  Neck: normal, supple, no masses, no thyromegaly Respiratory: clear to auscultation bilaterally, no wheezing, no crackles. Normal respiratory effort. No accessory muscle use.  Cardiovascular: Regular rate and rhythm, no murmurs / rubs / gallops. No extremity edema. 2+ pedal pulses. No carotid bruits.  Abdomen: no tenderness, no masses palpated. No hepatosplenomegaly. Bowel sounds positive.  Musculoskeletal: no clubbing / cyanosis. Redness, induration, erythema of the right lower extremity. Good ROM, no contractures. Normal muscle tone.  Skin: no rashes, lesions, ulcers. No induration Neurologic: CN 2-12 grossly intact. Sensation intact, DTR normal. Strength 5/5 in all 4.  Psychiatric: Normal judgment and insight. Alert and oriented x 3. Normal mood.     Labs on Admission: I have personally reviewed following labs and imaging studies  CBC: No results for input(s): WBC, NEUTROABS, HGB, HCT, MCV, PLT in the last 168 hours.  Basic Metabolic Panel: No results for input(s): NA, K, CL, CO2, GLUCOSE, BUN, CREATININE, CALCIUM, MG, PHOS in the last 168 hours.  GFR: CrCl cannot be calculated (Patient's most recent lab result is older than the maximum 21 days allowed.).  Liver Function Tests: No results for input(s): AST, ALT, ALKPHOS, BILITOT, PROT, ALBUMIN in the last 168 hours. No results for input(s): LIPASE, AMYLASE in the last 168 hours. No results for input(s): AMMONIA in the last 168 hours.  Coagulation Profile: No results for input(s): INR, PROTIME in the last 168 hours. No results for input(s): DDIMER in the last 72 hours.  Cardiac Enzymes: No results for input(s): CKTOTAL, CKMB, CKMBINDEX, TROPONINI in the last 168 hours.  BNP (last 3 results) No results for input(s): PROBNP in the last 8760 hours.  HbA1C: No results for input(s): HGBA1C in the last 72 hours. Lab Results  Component  Value Date   HGBA1C 6.2 (H) 11/18/2017     CBG: No results for input(s): GLUCAP in the last 168 hours.  Lipid Profile: No results for input(s): CHOL, HDL, LDLCALC, TRIG, CHOLHDL, LDLDIRECT in the last 72 hours.  Thyroid Function Tests: No results for input(s): TSH, T4TOTAL, FREET4, T3FREE, THYROIDAB in the last 72 hours.  Anemia Panel: No results for input(s): VITAMINB12, FOLATE, FERRITIN, TIBC, IRON, RETICCTPCT in the last 72 hours.  Urine analysis:    Component Value Date/Time   COLORURINE YELLOW 11/29/2017 1210   APPEARANCEUR CLEAR 11/29/2017 1210   LABSPEC  1.010 11/29/2017 1210   PHURINE 7.0 11/29/2017 1210   GLUCOSEU NEGATIVE 11/29/2017 1210   HGBUR NEGATIVE 11/29/2017 1210   BILIRUBINUR NEGATIVE 11/29/2017 1210   KETONESUR NEGATIVE 11/29/2017 1210   PROTEINUR NEGATIVE 11/29/2017 1210   UROBILINOGEN 0.2 06/24/2015 1134   NITRITE NEGATIVE 11/29/2017 1210   LEUKOCYTESUR NEGATIVE 11/29/2017 1210    Sepsis Labs: @LABRCNTIP (procalcitonin:4,lacticidven:4) )No results found for this or any previous visit (from the past 240 hour(s)).       Radiological Exams on Admission: No results found. No results found.    EKG: Independently reviewed.  Normal sinus rhythm with first-degree AV block  Assessment/Plan Active Problems:   Osteomyelitis (HCC)of the right foot Patient presents with osteomyelitis of the right second toe, along with cellulitis of the right lower extremity Patient will be admitted for IV antibiotics, possible sepsis Will check lactic acid, pro-calcitonin Start vancomycin and Zosyn Will likely need amputation on Friday IV fluids Venous Doppler to rule out [name] DVT in the right leg  Adrenal insufficiency Patient would need stress dose steroids on Thursday prior to his surgery Continue prednisone and Synthroid Will check TSH Patient is also on testosterone therefore injections which will be held  Dyslipidemia continue statin  History of CKD stage  III, baseline creatinine is around 1.3 Will follow renal function closely during this hospitalization      DVT prophylaxis:  heparin     Code Status Orders full code  (From admission, onward)      consults called:  Family Communication: Admission, patients condition and plan of care including tests being ordered have been discussed with the patient  who indicates understanding and agree with the plan and Code Status   Admission status:  The appropriate patient status for this patient is INPATIENT. Inpatient status is judged to be reasonable and necessary in order to provide the required intensity of service to ensure the patient's safety. The patient's presenting symptoms, physical exam findings, and initial radiographic and laboratory data in the context of their chronic comorbidities is felt to place them at high risk for further clinical deterioration. Furthermore, it is not anticipated that the patient will be medically stable for discharge from the hospital within 2 midnights of admission. The following factors support the patient status of inpatient.    "           The patient's presenting symptoms include low back pain. "           The worrisome physical exam findings include and inability to walk. "           The initial radiographic and laboratory data are worrisome because of sacral fracture on CT. "           The chronic co-morbidities include history of hypertension.     * I certify that at the point of admission it is my clinical judgment that the patient will require inpatient hospital care spanning beyond 2 midnights from the point of admission due to high intensity of service, high risk for further deterioration and high frequency of surveillance required.*    Disposition plan: Further plan will depend as patient's clinical course evolves and further radiologic and laboratory data become available. Likely home when stable    At the time of admission, it appears that the  appropriate admission status for this patient is INPATIENT . This is judged to be reasonable and necessary in order to provide the required intensity of service to ensure the patient's safety given the  presenting symptoms, physical exam findings, and initial radiographic and laboratory data in the context of their chronic comorbidities.   Reyne Dumas MD [facility] Pager [phone]  If 7PM-7AM, please contact night-coverage [email or web address] Password [email or web address]  03/20/2018, 2:01 PM

## 2018-03-20 NOTE — Progress Notes (Signed)
  Echocardiogram 2D Echocardiogram has been performed.  Jennette Dubin 03/20/2018, 3:30 PM

## 2018-03-20 NOTE — Progress Notes (Signed)
1300 Received pt as a direct admit. Pt is A&O x4. Right foot with ace wrap dry and intact. Message sent to Dr Allyson Sabal upon pt's arrival.

## 2018-03-20 NOTE — Progress Notes (Signed)
Office Visit Note   Patient: [name] [name].           Date of Birth: 02-03-1923           MRN: [id number] Visit Date: 03/20/2018              Requested by: [name], DO [address] [place], [place] [address] PCP: [name], DO  Chief Complaint  Patient presents with  . Right Foot - Edema      HPI: Patient is a 84 year old gentleman who complains of fever and chills since this past Saturday.  He states his temperature was 100.4 on Saturday.  He complains of cellulitis pain and swelling in the right foot.  Patient is status post total shoulder arthroplasty as well as bilateral total knee arthroplasties at [facility].  Assessment & Plan: Visit Diagnoses:  1. Amputated toe of right foot ([place])   2. Subacute osteomyelitis of right foot ([place])     Plan: Discussed that patient is at risk of sepsis in the joint replacements with his systemic symptoms.  I have contacted the hospitalist we will get him admitted started on IV antibiotics and plan for amputation of the second toe.  Patient most likely will need discharge to skilled nursing.  Follow-Up Instructions: Return in about 2 weeks (around 04/03/2018).   Ortho Exam  Patient is alert, oriented, no adenopathy, well-dressed, normal affect, normal respiratory effort. Examination patient has a good dorsalis pedis pulse he has cellulitis to the midfoot dermatitis and venous stasis changes in the right leg.  The ulcer over the tip of the second toe probes to bone with destructive changes of the tuft of the second toe.  Imaging: Xr Foot Complete Right  Result Date: 03/20/2018 3 view radiographs of the right foot shows destructive bony changes of the second toe.  No images are attached to the encounter.  Labs: Lab Results  Component Value Date   HGBA1C 6.5 (H) 03/20/2018   HGBA1C 6.2 (H) 11/18/2017   ESRSEDRATE 35 (H) 11/29/2017   ESRSEDRATE 57 (H) 11/18/2017   CRP 11.4 (H) 11/29/2017   CRP 8.5 (H)  11/18/2017   REPTSTATUS 12/04/2017 FINAL 11/29/2017   REPTSTATUS 12/04/2017 FINAL 11/29/2017   REPTSTATUS 11/30/2017 FINAL 11/29/2017   CULT  11/29/2017    NO GROWTH 5 DAYS Performed at [facility], [place] [address], [place], [place] [address]    CULT  11/29/2017    NO GROWTH 5 DAYS Performed at [facility] [address] [place], [place] [address]    CULT  11/29/2017    NO GROWTH Performed at [facility] [address] [place], [place] [address]      Lab Results  Component Value Date   ALBUMIN 3.2 (L) 03/20/2018   ALBUMIN 2.7 (L) 11/29/2017   ALBUMIN 3.6 10/12/2016   PREALBUMIN 14.7 (L) 11/18/2017    There is no height or weight on file to calculate BMI.  Orders:  Orders Placed This Encounter  Procedures  . XR Foot Complete Right   No orders of the defined types were placed in this encounter.    Procedures: No procedures performed  Clinical Data: No additional findings.  ROS:  All other systems negative, except as noted in the HPI. Review of Systems  Objective: Vital Signs: BP (!) 157/80   Pulse 77   Temp 98.1 F (36.7 C)   Specialty Comments:  No specialty comments available.  PMFS History: Patient  Active Problem List   Diagnosis Date Noted  . Toe osteomyelitis, right ([place]) 03/20/2018  . History of complete ray amputation of first toe of right foot ([place]) 12/28/2017  . Osteomyelitis of great toe of right foot ([place])   . Idiopathic chronic venous hypertension of both lower extremities with inflammation 12/13/2017  . Osteomyelitis ([place]) 11/29/2017  . Thrush 11/29/2017  . Plantar ulcer of right foot ([place])   . Ulcer of right foot with fat layer exposed ([place]) 11/18/2017  . Cellulitis of right foot 11/18/2017  . Cellulitis of right ankle 11/18/2017  . Adrenal insufficiency ([place]) 07/01/2017  . Bulging of intervertebral disc between L4 and L5 07/01/2017  . At high risk for bleeding 07/01/2017  . Right buttock pain 02/08/2017  .  Leukocytosis 10/12/2016  . HLD (hyperlipidemia) 04/20/2016  . BPH (benign prostatic hyperplasia) 12/08/2015  . CKD (chronic kidney disease) stage 3, GFR 30-59 ml/min (HCC) 11/27/2015  . S/P total knee arthroplasty 07/13/2015  . OA (osteoarthritis) of knee 06/29/2015  . Ectropion 05/06/2015  . Lateral meniscal tear 02/05/2015  . Hyponatremia 10/20/2013  . Weakness 10/19/2013  . Hypertension   . 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 Hypothyroidism (acquired)   . Anemia, B12 deficiency   . Meibomian gland disease 08/06/2013  . Primary open angle glaucoma 06/19/2013  . Arthritis of shoulder region, right 12/15/2011  . Exposure keratitis 08/17/2011  . Dry eye 07/13/2011   Past Medical History:  Diagnosis Date  . Allergic rhinitis    Prior allergy shots 20 years  . Anemia   . Anemia, B12 deficiency 2000  . Arthritis    Status post left total replacement 8 2011  . BPH (benign prostatic hyperplasia) 2013  . Cancer ([place])    renal cell ca and skin cancer   . Colitis 2014  . Difficult intubation 1994   surgery had to be  stopped due to injury to "throat"  . Difficult intubation 1994   no trouble since.  Required nasotracheal intubation  '02 [facility] / ANESTHESIA RECORD FROM 2013 AND 2016 IN EPIC  . GERD (gastroesophageal reflux disease)   . Glaucoma 2007   Status post left trabeculectomy 2007  . History of colon polyps    Colonoscopy 2001  . History of kidney stones   . History of renal cell carcinoma 1994   Status post right nephrectomy  . Hyperlipidemia 2003  . Hypertension   . 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 Hypothyroidism (acquired) 2000  . Nocturia   . Osteomyelitis ([place])    right great  . Osteomyelitis ([place])    RIGHT FOOT /TOE  . Pituitary mass ([place]) 2000   S/p transphenoidal excision 05/1999 ([facility])  . Vitamin D deficiency 2009    Family History  Problem Relation Age of Onset  . Lung cancer  Father   . Anesthesia problems Neg Hx   . Hypotension Neg Hx   . Malignant hyperthermia Neg Hx   . Pseudochol deficiency Neg Hx     Past Surgical History:  Procedure Laterality Date  . AMPUTATION Right 12/20/2017   Procedure: RIGHT 1ST RAY AMPUTATION;  Surgeon: [name], MD;  Location: [facility];  Service: Orthopedics;  Laterality: Right;  . BRAIN SURGERY  2000   pituatary gland removed .  Marland Kitchen CHOLECYSTECTOMY  1984  . Ectropion surgery Bilateral 2006  . EYE SURGERY  over last 6 yrs.     trabeculectomy...   Marland Kitchen  EYE SURGERY   cat ext ou  . JOINT REPLACEMENT  2011   knee left  . KNEE ARTHROSCOPY Right 02/06/2015   Procedure: RIGHT ARTHROSCOPY KNEE WITH LATERAL MENSICAL  DEBRIDEMENT;  Surgeon: Gaynelle Arabian, MD;  [facility];  Service: Orthopedics;  Laterality: Right;  . Mohs procedure      for skin cancer on nose   . NEPHRECTOMY Right 1994   Renal cell  . REVERSE SHOULDER ARTHROPLASTY  12/15/2011   Procedure: REVERSE SHOULDER ARTHROPLASTY;  Surgeon: Marin Shutter, MD;  Location: [facility];  Service: Orthopedics;  Laterality: Right;  right total reverse shoulder  . TONSILLECTOMY    . TOTAL KNEE ARTHROPLASTY Right 06/29/2015   Procedure: TOTAL KNEE ARTHROPLASTY;  Surgeon: Gaynelle Arabian, MD;  [facility];  Service: Orthopedics;  Laterality: Right;  . Transsphenoidal excision pituitary tumor  05/1999   A.Tommi Rumps, M.D.([facility])   Social History   Occupational History  . Not on file  Tobacco Use  . Smoking status: Former Smoker    Types: Pipe    Last attempt to quit: 09/26/1988    Years since quitting: 29.4  . Smokeless tobacco: Never Used  . Tobacco comment: about 20 years  Substance and Sexual Activity  . Alcohol use: Yes    Comment: 1 ounces of vodka nightly   . Drug use: No  . Sexual activity: Not Currently

## 2018-03-20 NOTE — Progress Notes (Signed)
Pharmacy Antibiotic Note  [name] [name]. is a 84 y.o. male admitted on 03/20/2018 with osteomyelitis.  Pharmacy has been consulted for vanc/zosyn dosing.  [name] [name] was recently admitted for fever and confirm osteomyelitis. The procedure is plan for Friday. He has a hx of CKD. LA 1, WBC 14, scr 1.28.  Plan:  Blood and urine culture Vanc 1.5g IV x 1 then 1g IV q24 Zosyn 3.375g IV q8 extended infusion Trough as needed    Temp (24hrs), Avg:98.1 F (36.7 C), Min:98 F (36.7 C), Max:98.1 F (36.7 C)  Recent Labs  Lab 03/20/18 1343  WBC 14.0*  CREATININE 1.28*  LATICACIDVEN 1.0    Estimated Creatinine Clearance: 38.4 mL/min (A) (by C-G formula based on SCr of 1.28 mg/dL (H)).    Allergies  Allergen Reactions  . Adhesive [Tape] Other (See Comments)    CAUSES SKIN TEARS, prefers paper tape   . Percocet [Oxycodone-Acetaminophen]   . Robaxin [Methocarbamol]     UNSPECIFIED REACTION   . Diovan [Valsartan] Rash    Antimicrobials this admission: 7/23 vanc >>  7/23 zosyn >>   Dose adjustments this admission:   Microbiology results:  7/23 BCx:   7/23 UCx:    Sputum:   7/23 MRSA PCR:   Onnie Boer, PharmD, BCIDP, AAHIVP, CPP Infectious Disease Pharmacist Pager: [phone] 03/20/2018 2:48 PM

## 2018-03-21 ENCOUNTER — Other Ambulatory Visit (INDEPENDENT_AMBULATORY_CARE_PROVIDER_SITE_OTHER): Payer: Self-pay | Admitting: Family

## 2018-03-21 ENCOUNTER — Encounter: Payer: Self-pay | Admitting: Internal Medicine

## 2018-03-21 ENCOUNTER — Inpatient Hospital Stay (HOSPITAL_COMMUNITY): Payer: PPO

## 2018-03-21 DIAGNOSIS — M8600 Acute hematogenous osteomyelitis, unspecified site: Secondary | ICD-10-CM

## 2018-03-21 DIAGNOSIS — M7989 Other specified soft tissue disorders: Secondary | ICD-10-CM

## 2018-03-21 DIAGNOSIS — N183 Chronic kidney disease, stage 3 (moderate): Secondary | ICD-10-CM

## 2018-03-21 DIAGNOSIS — M869 Osteomyelitis, unspecified: Secondary | ICD-10-CM

## 2018-03-21 DIAGNOSIS — E039 Hypothyroidism, unspecified: Secondary | ICD-10-CM

## 2018-03-21 LAB — GLUCOSE, CAPILLARY
GLUCOSE-CAPILLARY: 95 mg/dL (ref 70–99)
GLUCOSE-CAPILLARY: 210 mg/dL — AB (ref 70–99)
Glucose-Capillary: 77 mg/dL (ref 70–99)
Glucose-Capillary: 139 mg/dL — ABNORMAL HIGH (ref 70–99)

## 2018-03-21 LAB — COMPREHENSIVE METABOLIC PANEL
ALT: 14 U/L (ref 0–44)
AST: 15 U/L (ref 15–41)
Albumin: 2.4 g/dL — ABNORMAL LOW (ref 3.5–5.0)
Alkaline Phosphatase: 43 U/L (ref 38–126)
Anion gap: 6 (ref 5–15)
BILIRUBIN TOTAL: 1 mg/dL (ref 0.3–1.2)
BUN: 12 mg/dL (ref 8–23)
CHLORIDE: 102 mmol/L (ref 98–111)
CO2: 29 mmol/L (ref 22–32)
CREATININE: 1.38 mg/dL — AB (ref 0.61–1.24)
Calcium: 8.8 mg/dL — ABNORMAL LOW (ref 8.9–10.3)
GFR calc non Af Amer: 42 mL/min — ABNORMAL LOW (ref >60)
GFR, EST AFRICAN AMERICAN: 49 mL/min — AB (ref >60)
Glucose, Bld: 83 mg/dL (ref 70–99)
POTASSIUM: 3.8 mmol/L (ref 3.5–5.1)
Sodium: 137 mmol/L (ref 135–145)
TOTAL PROTEIN: 5.3 g/dL — AB (ref 6.5–8.1)

## 2018-03-21 LAB — ECHOCARDIOGRAM COMPLETE

## 2018-03-21 LAB — CBC
HEMATOCRIT: 34.5 % — AB (ref 39.0–52.0)
Hemoglobin: 11.2 g/dL — ABNORMAL LOW (ref 13.0–17.0)
MCH: 31.9 pg (ref 26.0–34.0)
MCHC: 32.5 g/dL (ref 30.0–36.0)
MCV: 98.3 fL (ref 78.0–100.0)
PLATELETS: 238 10*3/uL (ref 150–400)
RBC: 3.51 MIL/uL — AB (ref 4.22–5.81)
RDW: 14.3 % (ref 11.5–15.5)
WBC: 10.4 10*3/uL (ref 4.0–10.5)

## 2018-03-21 LAB — PROCALCITONIN: Procalcitonin: <0.1 ng/mL

## 2018-03-21 NOTE — Progress Notes (Signed)
Blood sugar 77, gave patient ensure to drink.

## 2018-03-21 NOTE — Progress Notes (Signed)
PROGRESS NOTE    [name]  KPV:[id number] DOB: 01/31/1923 DOA: 03/20/2018 PCP: [name], DO   Brief Narrative:  HPI On 03/20/2018 by Dr. 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 Sharol Given with complaints of right lower extremity swelling, erythema, chills, low-grade fever, myalgias since Thursday. Patient noticed drainage and redness from his second right toe,and redness moving up his foot into his right lower extremity. He also noticed worsening dependent edema and swelling. He started experiencing low-grade fever of around 100 degrees Fahrenheit which  he has been trying to keep under control with prn Tylenol.patient also complains of chills, rigors, during this interview. He also complains of being queasy and nauseous. He denies any chest pain or shortness of breath.  Interim history Admitted for osteomyelitis of the right 2nd toe along with cellulitis. Placed on zosyn and vancomycin. Dr. Sharol Given, consulted, plans for surgery on 7/26.  Assessment & Plan   Osteomyelitis of the right second toe along with cellulitis -Patient placed on vancomycin and Zosyn -Orthopedics, Dr. Sharol Given, consulted and appreciated, plan for surgery on 03/23/2018 -Continue IV fluids -Lower extremity Doppler pending -Echocardiogram pending -Blood cultures show no growth to date  Possible Adrenal insufficiency -Patient takes chronic prednisone as well as testosterone injections which have been held -Continue prednisone, currently blood pressure within normal limits and stable -May need to be  placed on stress dose steroids -Cortisol level within normal limits  Hypothyroidism -TSH <0.10 -Will obtain free T4 level -Continue Synthroid  Dyslipidemia -Continue statin  Chronic kidney disease, stage III -Creatinine appears to be at baseline, currently  1.38 -Continue to monitor BMP   DVT Prophylaxis  Heparin  Code Status: Full  Family Communication: None at bedside  Disposition Plan:Admitted. Pending surgery on 7/26  Consultants Orthopedics, Dr.Duda  Procedures  None  Antibiotics   Anti-infectives (From admission, onward)   Start     Dose/Rate Route Frequency Ordered Stop   03/21/18 1800  vancomycin (VANCOCIN) IVPB 750 mg/150 ml premix     750 mg 150 mL/hr over 60 Minutes Intravenous Every 24 hours 03/20/18 1504     03/20/18 1700  vancomycin (VANCOCIN) 1,500 mg in sodium chloride 0.9 % 500 mL IVPB     1,500 mg 250 mL/hr over 120 Minutes Intravenous  Once 03/20/18 1455 03/20/18 2038   03/20/18 1700  piperacillin-tazobactam (ZOSYN) IVPB 3.375 g     3.375 g 12.5 mL/hr over 240 Minutes Intravenous Every 8 hours 03/20/18 1455        Subjective:   Ollen Bowl seen and examined today.  Complains of feeling tired and sleepy. Had some nausea this morning. Denies current chest pain, shortness of breath, abdominal pain, vomiting, diarrhea, constipation, dizziness, headache.     Objective:   Vitals:   03/20/18 1300 03/20/18 1450 03/20/18 1946 03/21/18 0427  BP: (!) 156/70  (!) 124/48 (!) 136/55  Pulse: 79  77 79  Resp: 15  16 16   Temp: 98 F (36.7 C)  98 F (36.7 C) 99.2 F (37.3 C)  TempSrc: Oral  Oral Oral  SpO2: 98%  96% 96%  Weight:  81.6 kg (180 lb)    Height:  5\' 11"  (1.803 m)      Intake/Output Summary (Last 24 hours) at 03/21/2018 1002 Last data filed at 03/21/2018 0029 Gross per 24 hour  Intake 360 ml  Output 150  ml  Net 210 ml   Filed Weights   03/20/18 1450  Weight: 81.6 kg (180 lb)    Exam  General: Well developed, well nourished, NAD, appears stated age  HEENT: NCAT, PERRLA, EOMI, Anicteic Sclera, mucous membranes moist.   Neck: Supple, no JVD, no masses  Cardiovascular: S1 S2 auscultated, no rubs, murmurs or gallops. Regular rate and rhythm.  Respiratory: Clear to auscultation  bilaterally with equal chest rise  Abdomen: Soft, nontender, nondistended, + bowel sounds  Extremities: warm dry without cyanosis clubbing or edema of LLE. RLE with mild erythema, s/p great toe amputation; ulceration of second toe tip  Neuro: AAOx3, cranial nerves grossly intact. Strength 5/5 in patient's upper and lower extremities bilaterally  Skin: Without rashes exudates or nodules  Psych: Normal affect and demeanor with intact judgement and insight   Data Reviewed: I have personally reviewed following labs and imaging studies  CBC: Recent Labs  Lab 03/20/18 1343 03/21/18 0512  WBC 14.0* 10.4  HGB 12.8* 11.2*  HCT 39.3 34.5*  MCV 98.7 98.3  PLT 254 161   Basic Metabolic Panel: Recent Labs  Lab 03/20/18 1343 03/21/18 0512  NA 135 137  K 4.1 3.8  CL 100 102  CO2 26 29  GLUCOSE 85 83  BUN 16 12  CREATININE 1.28* 1.38*  CALCIUM 9.4 8.8*  MG 2.0  --    GFR: Estimated Creatinine Clearance: 35.6 mL/min (A) (by C-G formula based on SCr of 1.38 mg/dL (H)). Liver Function Tests: Recent Labs  Lab 03/20/18 1343 03/21/18 0512  AST 17 15  ALT 15 14  ALKPHOS 49 43  BILITOT 0.8 1.0  PROT 6.5 5.3*  ALBUMIN 3.2* 2.4*   No results for input(s): LIPASE, AMYLASE in the last 168 hours. No results for input(s): AMMONIA in the last 168 hours. Coagulation Profile: No results for input(s): INR, PROTIME in the last 168 hours. Cardiac Enzymes: Recent Labs  Lab 03/20/18 1343 03/20/18 1943  TROPONINI <0.03 <0.03   BNP (last 3 results) No results for input(s): PROBNP in the last 8760 hours. HbA1C: Recent Labs    03/20/18 1349  HGBA1C 6.5*   CBG: Recent Labs  Lab 03/20/18 1630 03/20/18 2048 03/21/18 0625  GLUCAP 71 104* 77   Lipid Profile: No results for input(s): CHOL, HDL, LDLCALC, TRIG, CHOLHDL, LDLDIRECT in the last 72 hours. Thyroid Function Tests: Recent Labs    03/20/18 1602  TSH <0.010*   Anemia Panel: No results for input(s): VITAMINB12, FOLATE,  FERRITIN, TIBC, IRON, RETICCTPCT in the last 72 hours. Urine analysis:    Component Value Date/Time   COLORURINE YELLOW 11/29/2017 1210   APPEARANCEUR CLEAR 11/29/2017 1210   LABSPEC 1.010 11/29/2017 1210   PHURINE 7.0 11/29/2017 1210   GLUCOSEU NEGATIVE 11/29/2017 1210   HGBUR NEGATIVE 11/29/2017 1210   BILIRUBINUR NEGATIVE 11/29/2017 1210   KETONESUR NEGATIVE 11/29/2017 1210   PROTEINUR NEGATIVE 11/29/2017 1210   UROBILINOGEN 0.2 06/24/2015 1134   NITRITE NEGATIVE 11/29/2017 1210   LEUKOCYTESUR NEGATIVE 11/29/2017 1210   Sepsis Labs: @LABRCNTIP (procalcitonin:4,lacticidven:4)  ) Recent Results (from the past 240 hour(s))  MRSA PCR Screening     Status: None   Collection Time: 03/20/18  2:54 PM  Result Value Ref Range Status   MRSA by PCR NEGATIVE NEGATIVE Final    Comment:        The GeneXpert MRSA Assay (FDA approved for NASAL specimens only), is one component of a comprehensive MRSA colonization surveillance program. It is not intended to diagnose  MRSA infection nor to guide or monitor treatment for MRSA infections. Performed at [facility], [place] [address] [place], [place] [address]       Radiology Studies: Xr Foot Complete Right  Result Date: 03/20/2018 3 view radiographs of the right foot shows destructive bony changes of the second toe.    Scheduled Meds: . feeding supplement (ENSURE ENLIVE)  237 mL Oral BID BM  . finasteride  5 mg Oral q morning - 10a  . heparin  5,000 Units Subcutaneous Q8H  . insulin aspart  0-9 Units Subcutaneous TID WC  . levothyroxine  88 mcg Oral QAC breakfast  . pravastatin  80 mg Oral QPM  . predniSONE  5 mg Oral q morning - 10a  . vitamin B-12  500 mcg Oral Daily   Continuous Infusions: . piperacillin-tazobactam (ZOSYN)  IV 3.375 g (03/21/18 0954)  . vancomycin       LOS: 1 day   Time Spent in minutes   30 minutes  Gracin Mcpartland D.O. on 03/21/2018 at 10:02 AM  Between 7am to 7pm - Pager -  [phone]  After 7pm go to [email or web address] - password [email or web address]  And look for the night coverage person covering for me after hours  [facility] Office  [phone]

## 2018-03-21 NOTE — H&P (View-Only) (Signed)
ORTHOPAEDIC CONSULTATION  REQUESTING PHYSICIAN: Cristal Ford, DO  Chief Complaint: Osteomyelitis right foot second toe with cellulitis extending proximally to the ankle.  HPI: [name] [name]. is a 84 y.o. male who presents with cellulitis and osteomyelitis initially presented to the office yesterday.  Patient was admitted for IV antibiotics to minimize risk of septic joints with 3 total joint arthroplasties.  Patient is currently on IV antibiotics with significant decreased cellulitis.  Past Medical History:  Diagnosis Date  . Allergic rhinitis    Prior allergy shots 20 years  . Anemia   . Anemia, B12 deficiency 2000  . Arthritis    Status post left total replacement 8 2011  . BPH (benign prostatic hyperplasia) 2013  . Cancer ([place])    renal cell ca and skin cancer   . Colitis 2014  . Difficult intubation 1994   surgery had to be  stopped due to injury to "throat"  . Difficult intubation 1994   no trouble since.  Required nasotracheal intubation  '02 [facility] / ANESTHESIA RECORD FROM 2013 AND 2016 IN EPIC  . GERD (gastroesophageal reflux disease)   . Glaucoma 2007   Status post left trabeculectomy 2007  . History of colon polyps    Colonoscopy 2001  . History of kidney stones   . History of renal cell carcinoma 1994   Status post right nephrectomy  . Hyperlipidemia 2003  . Hypertension   . 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 Hypothyroidism (acquired) 2000  . Nocturia   . Osteomyelitis ([place])    right great  . Osteomyelitis ([place])    RIGHT FOOT /TOE  . Pituitary mass ([place]) 2000   S/p transphenoidal excision 05/1999 ([facility])  . Vitamin D deficiency 2009   Past Surgical History:  Procedure Laterality Date  . AMPUTATION Right 12/20/2017   Procedure: RIGHT 1ST RAY AMPUTATION;  Surgeon: [name], MD;  Location: [facility];  Service: Orthopedics;  Laterality: Right;  . BRAIN SURGERY  2000   pituatary gland  removed .  Marland Kitchen CHOLECYSTECTOMY  1984  . Ectropion surgery Bilateral 2006  . EYE SURGERY  over last 6 yrs.     trabeculectomy...   . EYE SURGERY   cat ext ou  . JOINT REPLACEMENT  2011   knee left  . KNEE ARTHROSCOPY Right 02/06/2015   Procedure: RIGHT ARTHROSCOPY KNEE WITH LATERAL MENSICAL  DEBRIDEMENT;  Surgeon: Gaynelle Arabian, MD;  Location: [facility];  Service: Orthopedics;  Laterality: Right;  . Mohs procedure      for skin cancer on nose   . NEPHRECTOMY Right 1994   Renal cell  . REVERSE SHOULDER ARTHROPLASTY  12/15/2011   Procedure: REVERSE SHOULDER ARTHROPLASTY;  Surgeon: Marin Shutter, MD;  Location: [facility];  Service: Orthopedics;  Laterality: Right;  right total reverse shoulder  . TONSILLECTOMY    . TOTAL KNEE ARTHROPLASTY Right 06/29/2015   Procedure: TOTAL KNEE ARTHROPLASTY;  Surgeon: Gaynelle Arabian, MD;  Location: [facility];  Service: Orthopedics;  Laterality: Right;  . Transsphenoidal excision pituitary tumor  05/1999   A.Tommi Rumps, M.D.([facility])   Social History   Socioeconomic History  . Marital status: Widowed    Spouse name: Not on file  . Number of children: Not on file  . Years of education: Not on file  . Highest education level: Not on file  Occupational History  . Not on file  Social Needs  . Financial resource strain: Not  on file  . Food insecurity:    Worry: Not on file    Inability: Not on file  . Transportation needs:    Medical: Not on file    Non-medical: Not on file  Tobacco Use  . Smoking status: Former Smoker    Types: Pipe    Last attempt to quit: 09/26/1988    Years since quitting: 29.5  . Smokeless tobacco: Never Used  . Tobacco comment: about 20 years  Substance and Sexual Activity  . Alcohol use: Yes    Comment: 1 ounces of vodka nightly   . Drug use: No  . Sexual activity: Not Currently  Lifestyle  . Physical activity:    Days per week: Not on file    Minutes per session: Not on file  . Stress: Not on file  Relationships  . Social  connections:    Talks on phone: Not on file    Gets together: Not on file    Attends religious service: Not on file    Active member of club or organization: Not on file    Attends meetings of clubs or organizations: Not on file    Relationship status: Not on file  Other Topics Concern  . Not on file  Social History Narrative   Patient is Married Software engineer). Retired Engineer, mining) Administrator, sports. Lives in apartment, Independent Living section at [facility] since 2000.  Spouse lives in skilled nursing section in same community   Smoking 1994 , moderate alcohol use: 2 drinks a night. Exercises regularly with walking and exercise classes. Continues to drive   Patient has Advanced planning documents: Living Will               Family History  Problem Relation Age of Onset  . Lung cancer Father   . Anesthesia problems Neg Hx   . Hypotension Neg Hx   . Malignant hyperthermia Neg Hx   . Pseudochol deficiency Neg Hx    - negative except otherwise stated in the family history section Allergies  Allergen Reactions  . Adhesive [Tape] Other (See Comments)    CAUSES SKIN TEARS, prefers paper tape   . Percocet [Oxycodone-Acetaminophen] Other (See Comments)    Does not want to take  . Robaxin [Methocarbamol]     UNSPECIFIED REACTION   . Diovan [Valsartan] Rash   Prior to Admission medications   Medication Sig Start Date End Date Taking? Authorizing Provider  acetaminophen (TYLENOL) 500 MG tablet Take 500 mg by mouth daily as needed for fever.    Yes [provider]  Cholecalciferol (VITAMIN D) 2000 units tablet Take 2,000 Units by mouth daily.   Yes [provider]  Coenzyme Q10 300 MG CAPS Take 300 mg by mouth daily.    Yes [provider]  diclofenac sodium (VOLTAREN) 1 % GEL Apply 2 g topically 4 (four) times daily.   Yes [provider]  docusate sodium (COLACE) 100 MG capsule Take 200 mg by mouth at bedtime.   Yes  [provider]  finasteride (PROSCAR) 5 MG tablet Take 5 mg by mouth daily.    Yes [provider]  Misc Natural Products (FIBER 7) POWD Take 2 Doses by mouth at bedtime.   Yes [provider]  Multiple Vitamins-Minerals (MULTIVITAMIN & MINERAL PO) Take 1 tablet by mouth daily.   Yes [provider]  Omega-3 Fatty Acids (OMEGA-3 FISH OIL PO) Take 700 mg by mouth daily.   Yes [provider]  Polyethyl Glycol-Propyl Glycol (SYSTANE OP) Place 1 drop into both eyes daily as needed (dry eyes).   Yes [provider]  pravastatin (PRAVACHOL) 80 MG tablet Take 80 mg by mouth every other day.    Yes [provider]  predniSONE (DELTASONE) 5 MG tablet Take 2.5-5 mg by mouth See admin instructions. 5mg  in the morning and 2.5mg  in the evening   Yes [provider]  SYNTHROID 88 MCG tablet Take 88 mcg by mouth daily before breakfast.  12/26/16  Yes [provider]  testosterone cypionate (DEPOTESTOSTERONE CYPIONATE) 200 MG/ML injection Inject 30 mg (0.15 ml) intramuscularly weekly. Must discard vial after 90 days. 01/25/17  Yes [provider]  vitamin B-12 (CYANOCOBALAMIN) 500 MCG tablet Take 500 mcg by mouth daily.   Yes [provider]   Xr Foot Complete Right  Result Date: 03/20/2018 3 view radiographs of the right foot shows destructive bony changes of the second toe.  - pertinent xrays, CT, MRI studies were reviewed and independently interpreted  Positive ROS: All other systems have been reviewed and were otherwise negative with the exception of those mentioned in the HPI and as above.  Physical Exam: General: Alert, no acute distress Psychiatric: Patient is competent for consent with normal mood and affect Lymphatic: No axillary or cervical lymphadenopathy Cardiovascular: No pedal edema Respiratory: No cyanosis, no use of accessory musculature GI: No organomegaly, abdomen is soft and  non-tender    Images:  @ENCIMAGES @  Labs:  Lab Results  Component Value Date   HGBA1C 6.5 (H) 03/20/2018   HGBA1C 6.2 (H) 11/18/2017   ESRSEDRATE 35 (H) 11/29/2017   ESRSEDRATE 57 (H) 11/18/2017   CRP 11.4 (H) 11/29/2017   CRP 8.5 (H) 11/18/2017   REPTSTATUS 12/04/2017 FINAL 11/29/2017   REPTSTATUS 12/04/2017 FINAL 11/29/2017   REPTSTATUS 11/30/2017 FINAL 11/29/2017   CULT  11/29/2017    NO GROWTH 5 DAYS Performed at [facility], [place] [address], [place], [place] [address]    CULT  11/29/2017    NO GROWTH 5 DAYS Performed at [facility] [address] [place], [place] [address]    CULT  11/29/2017    NO GROWTH Performed at [facility] [address], [place], [place] [address]     Lab Results  Component Value Date   ALBUMIN 2.4 (L) 03/21/2018   ALBUMIN 3.2 (L) 03/20/2018   ALBUMIN 2.7 (L) 11/29/2017   PREALBUMIN 14.7 (L) 11/18/2017    Neurologic: Patient does not have protective sensation bilateral lower extremities.   MUSCULOSKELETAL:   Skin: Examination patient does have dermatitis in the foot and leg from venous insufficiency.  He has a good dorsalis pedis pulse the cellulitis has improved.  There is an ulcer which probes to bone right foot second toe.  Patient is alert and oriented this morning without fever or chills.  Assessment: Assessment: Resolving cellulitis right lower extremity with osteomyelitis right second toe with diabetic insensate neuropathy and severe protein caloric malnutrition.  Plan: Plan: We will plan for right foot second toe amputation on Friday.  Risks and benefits were discussed including risk of the wound not healing need for higher level amputation.  Patient states he understands wished to proceed at this time.  Thank you for the consult and the opportunity to see [name] [name], [place] [phone] 2:07 PM

## 2018-03-21 NOTE — Progress Notes (Signed)
*  Preliminary Results* Right lower extremity venous duplex completed. Right lower extremity is negative for deep vein thrombosis. Right lower extremity is positive for Superficial thrombosis in the small saphenous vein. There is no evidence of right Baker's cyst.  03/21/2018 10:42 AM  Abram Sander

## 2018-03-21 NOTE — Consult Note (Signed)
ORTHOPAEDIC CONSULTATION  REQUESTING PHYSICIAN: Cristal Ford, DO  Chief Complaint: Osteomyelitis right foot second toe with cellulitis extending proximally to the ankle.  HPI: [name] [name]. is a 84 y.o. male who presents with cellulitis and osteomyelitis initially presented to the office yesterday.  Patient was admitted for IV antibiotics to minimize risk of septic joints with 3 total joint arthroplasties.  Patient is currently on IV antibiotics with significant decreased cellulitis.  Past Medical History:  Diagnosis Date  . Allergic rhinitis    Prior allergy shots 20 years  . Anemia   . Anemia, B12 deficiency 2000  . Arthritis    Status post left total replacement 8 2011  . BPH (benign prostatic hyperplasia) 2013  . Cancer ([place])    renal cell ca and skin cancer   . Colitis 2014  . Difficult intubation 1994   surgery had to be  stopped due to injury to "throat"  . Difficult intubation 1994   no trouble since.  Required nasotracheal intubation  '02 [facility] / ANESTHESIA RECORD FROM 2013 AND 2016 IN EPIC  . GERD (gastroesophageal reflux disease)   . Glaucoma 2007   Status post left trabeculectomy 2007  . History of colon polyps    Colonoscopy 2001  . History of kidney stones   . History of renal cell carcinoma 1994   Status post right nephrectomy  . Hyperlipidemia 2003  . Hypertension   . 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 Hypothyroidism (acquired) 2000  . Nocturia   . Osteomyelitis ([place])    right great  . Osteomyelitis ([place])    RIGHT FOOT /TOE  . Pituitary mass ([place]) 2000   S/p transphenoidal excision 05/1999 ([facility])  . Vitamin D deficiency 2009   Past Surgical History:  Procedure Laterality Date  . AMPUTATION Right 12/20/2017   Procedure: RIGHT 1ST RAY AMPUTATION;  Surgeon: [name], MD;  Location: [facility];  Service: Orthopedics;  Laterality: Right;  . BRAIN SURGERY  2000   pituatary gland  removed .  Marland Kitchen CHOLECYSTECTOMY  1984  . Ectropion surgery Bilateral 2006  . EYE SURGERY  over last 6 yrs.     trabeculectomy...   . EYE SURGERY   cat ext ou  . JOINT REPLACEMENT  2011   knee left  . KNEE ARTHROSCOPY Right 02/06/2015   Procedure: RIGHT ARTHROSCOPY KNEE WITH LATERAL MENSICAL  DEBRIDEMENT;  Surgeon: Gaynelle Arabian, MD;  Location: [facility];  Service: Orthopedics;  Laterality: Right;  . Mohs procedure      for skin cancer on nose   . NEPHRECTOMY Right 1994   Renal cell  . REVERSE SHOULDER ARTHROPLASTY  12/15/2011   Procedure: REVERSE SHOULDER ARTHROPLASTY;  Surgeon: Marin Shutter, MD;  Location: [facility];  Service: Orthopedics;  Laterality: Right;  right total reverse shoulder  . TONSILLECTOMY    . TOTAL KNEE ARTHROPLASTY Right 06/29/2015   Procedure: TOTAL KNEE ARTHROPLASTY;  Surgeon: Gaynelle Arabian, MD;  Location: [facility];  Service: Orthopedics;  Laterality: Right;  . Transsphenoidal excision pituitary tumor  05/1999   A.Tommi Rumps, M.D.([facility])   Social History   Socioeconomic History  . Marital status: Widowed    Spouse name: Not on file  . Number of children: Not on file  . Years of education: Not on file  . Highest education level: Not on file  Occupational History  . Not on file  Social Needs  . Financial resource strain: Not  on file  . Food insecurity:    Worry: Not on file    Inability: Not on file  . Transportation needs:    Medical: Not on file    Non-medical: Not on file  Tobacco Use  . Smoking status: Former Smoker    Types: Pipe    Last attempt to quit: 09/26/1988    Years since quitting: 29.5  . Smokeless tobacco: Never Used  . Tobacco comment: about 20 years  Substance and Sexual Activity  . Alcohol use: Yes    Comment: 1 ounces of vodka nightly   . Drug use: No  . Sexual activity: Not Currently  Lifestyle  . Physical activity:    Days per week: Not on file    Minutes per session: Not on file  . Stress: Not on file  Relationships  . Social  connections:    Talks on phone: Not on file    Gets together: Not on file    Attends religious service: Not on file    Active member of club or organization: Not on file    Attends meetings of clubs or organizations: Not on file    Relationship status: Not on file  Other Topics Concern  . Not on file  Social History Narrative   Patient is Married Software engineer). Retired Engineer, mining) Administrator, sports. Lives in apartment, Independent Living section at [facility] since 2000.  Spouse lives in skilled nursing section in same community   Smoking 1994 , moderate alcohol use: 2 drinks a night. Exercises regularly with walking and exercise classes. Continues to drive   Patient has Advanced planning documents: Living Will               Family History  Problem Relation Age of Onset  . Lung cancer Father   . Anesthesia problems Neg Hx   . Hypotension Neg Hx   . Malignant hyperthermia Neg Hx   . Pseudochol deficiency Neg Hx    - negative except otherwise stated in the family history section Allergies  Allergen Reactions  . Adhesive [Tape] Other (See Comments)    CAUSES SKIN TEARS, prefers paper tape   . Percocet [Oxycodone-Acetaminophen] Other (See Comments)    Does not want to take  . Robaxin [Methocarbamol]     UNSPECIFIED REACTION   . Diovan [Valsartan] Rash   Prior to Admission medications   Medication Sig Start Date End Date Taking? Authorizing Provider  acetaminophen (TYLENOL) 500 MG tablet Take 500 mg by mouth daily as needed for fever.    Yes [provider]  Cholecalciferol (VITAMIN D) 2000 units tablet Take 2,000 Units by mouth daily.   Yes [provider]  Coenzyme Q10 300 MG CAPS Take 300 mg by mouth daily.    Yes [provider]  diclofenac sodium (VOLTAREN) 1 % GEL Apply 2 g topically 4 (four) times daily.   Yes [provider]  docusate sodium (COLACE) 100 MG capsule Take 200 mg by mouth at bedtime.   Yes  [provider]  finasteride (PROSCAR) 5 MG tablet Take 5 mg by mouth daily.    Yes [provider]  Misc Natural Products (FIBER 7) POWD Take 2 Doses by mouth at bedtime.   Yes [provider]  Multiple Vitamins-Minerals (MULTIVITAMIN & MINERAL PO) Take 1 tablet by mouth daily.   Yes [provider]  Omega-3 Fatty Acids (OMEGA-3 FISH OIL PO) Take 700 mg by mouth daily.   Yes [provider]  Polyethyl Glycol-Propyl Glycol (SYSTANE OP) Place 1 drop into both eyes daily as needed (dry eyes).   Yes [provider]  pravastatin (PRAVACHOL) 80 MG tablet Take 80 mg by mouth every other day.    Yes [provider]  predniSONE (DELTASONE) 5 MG tablet Take 2.5-5 mg by mouth See admin instructions. 5mg  in the morning and 2.5mg  in the evening   Yes [provider]  SYNTHROID 88 MCG tablet Take 88 mcg by mouth daily before breakfast.  12/26/16  Yes [provider]  testosterone cypionate (DEPOTESTOSTERONE CYPIONATE) 200 MG/ML injection Inject 30 mg (0.15 ml) intramuscularly weekly. Must discard vial after 90 days. 01/25/17  Yes [provider]  vitamin B-12 (CYANOCOBALAMIN) 500 MCG tablet Take 500 mcg by mouth daily.   Yes [provider]   Xr Foot Complete Right  Result Date: 03/20/2018 3 view radiographs of the right foot shows destructive bony changes of the second toe.  - pertinent xrays, CT, MRI studies were reviewed and independently interpreted  Positive ROS: All other systems have been reviewed and were otherwise negative with the exception of those mentioned in the HPI and as above.  Physical Exam: General: Alert, no acute distress Psychiatric: Patient is competent for consent with normal mood and affect Lymphatic: No axillary or cervical lymphadenopathy Cardiovascular: No pedal edema Respiratory: No cyanosis, no use of accessory musculature GI: No organomegaly, abdomen is soft and  non-tender    Images:  @ENCIMAGES @  Labs:  Lab Results  Component Value Date   HGBA1C 6.5 (H) 03/20/2018   HGBA1C 6.2 (H) 11/18/2017   ESRSEDRATE 35 (H) 11/29/2017   ESRSEDRATE 57 (H) 11/18/2017   CRP 11.4 (H) 11/29/2017   CRP 8.5 (H) 11/18/2017   REPTSTATUS 12/04/2017 FINAL 11/29/2017   REPTSTATUS 12/04/2017 FINAL 11/29/2017   REPTSTATUS 11/30/2017 FINAL 11/29/2017   CULT  11/29/2017    NO GROWTH 5 DAYS Performed at [facility], [place] [address] [place], [place] [address]    CULT  11/29/2017    NO GROWTH 5 DAYS Performed at [facility] [address], [place], [place] [address]    CULT  11/29/2017    NO GROWTH Performed at [facility] [address] [place], [place] [address]     Lab Results  Component Value Date   ALBUMIN 2.4 (L) 03/21/2018   ALBUMIN 3.2 (L) 03/20/2018   ALBUMIN 2.7 (L) 11/29/2017   PREALBUMIN 14.7 (L) 11/18/2017    Neurologic: Patient does not have protective sensation bilateral lower extremities.   MUSCULOSKELETAL:   Skin: Examination patient does have dermatitis in the foot and leg from venous insufficiency.  He has a good dorsalis pedis pulse the cellulitis has improved.  There is an ulcer which probes to bone right foot second toe.  Patient is alert and oriented this morning without fever or chills.  Assessment: Assessment: Resolving cellulitis right lower extremity with osteomyelitis right second toe with diabetic insensate neuropathy and severe protein caloric malnutrition.  Plan: Plan: We will plan for right foot second toe amputation on Friday.  Risks and benefits were discussed including risk of the wound not healing need for higher level amputation.  Patient states he understands wished to proceed at this time.  Thank you for the consult and the opportunity to see [name] [name], [place] [phone] 2:07 PM

## 2018-03-22 ENCOUNTER — Telehealth (INDEPENDENT_AMBULATORY_CARE_PROVIDER_SITE_OTHER): Payer: Self-pay | Admitting: Orthopedic Surgery

## 2018-03-22 DIAGNOSIS — E785 Hyperlipidemia, unspecified: Secondary | ICD-10-CM

## 2018-03-22 LAB — PROCALCITONIN

## 2018-03-22 LAB — CBC
HCT: 37 % — ABNORMAL LOW (ref 39.0–52.0)
Hemoglobin: 12.1 g/dL — ABNORMAL LOW (ref 13.0–17.0)
MCH: 32.3 pg (ref 26.0–34.0)
MCHC: 32.7 g/dL (ref 30.0–36.0)
MCV: 98.7 fL (ref 78.0–100.0)
PLATELETS: 267 10*3/uL (ref 150–400)
RBC: 3.75 MIL/uL — ABNORMAL LOW (ref 4.22–5.81)
RDW: 14.1 % (ref 11.5–15.5)
WBC: 12.3 10*3/uL — AB (ref 4.0–10.5)

## 2018-03-22 LAB — T4, FREE: FREE T4: 1.08 ng/dL (ref 0.82–1.77)

## 2018-03-22 LAB — GLUCOSE, CAPILLARY
GLUCOSE-CAPILLARY: 92 mg/dL (ref 70–99)
GLUCOSE-CAPILLARY: 179 mg/dL — AB (ref 70–99)
Glucose-Capillary: 96 mg/dL (ref 70–99)
Glucose-Capillary: 193 mg/dL — ABNORMAL HIGH (ref 70–99)

## 2018-03-22 LAB — BASIC METABOLIC PANEL
Anion gap: 8 (ref 5–15)
BUN: 13 mg/dL (ref 8–23)
CALCIUM: 9.2 mg/dL (ref 8.9–10.3)
CO2: 28 mmol/L (ref 22–32)
CREATININE: 1.55 mg/dL — AB (ref 0.61–1.24)
Chloride: 101 mmol/L (ref 98–111)
GFR calc Af Amer: 43 mL/min — ABNORMAL LOW (ref >60)
GFR, EST NON AFRICAN AMERICAN: 37 mL/min — AB (ref >60)
Glucose, Bld: 111 mg/dL — ABNORMAL HIGH (ref 70–99)
Potassium: 4.2 mmol/L (ref 3.5–5.1)
SODIUM: 137 mmol/L (ref 135–145)

## 2018-03-22 LAB — URINE CULTURE: CULTURE: NO GROWTH

## 2018-03-22 MED ORDER — CHLORHEXIDINE GLUCONATE 4 % EX LIQD: 60.0000 mL | Freq: Once | CUTANEOUS | Status: DC

## 2018-03-22 MED ORDER — VANCOMYCIN HCL 10 G IV SOLR
1250.0000 mg | INTRAVENOUS | Status: DC
  Administered 2018-03-22 – 2018-03-23 (×2): 1250 mg via INTRAVENOUS
  Filled 2018-03-22 (×3): qty 1250

## 2018-03-22 MED ORDER — PREMIER PROTEIN SHAKE
11.0000 [oz_av] | Freq: Two times a day (BID) | ORAL | Status: DC
  Administered 2018-03-24: 11 [oz_av] via ORAL
  Filled 2018-03-22 (×4): qty 325.31

## 2018-03-22 NOTE — Telephone Encounter (Signed)
Unable to reach patient to let him know that his surgical case tomorrow has been moved to an earlier time  (11am).  Both home and cell numbers did not have a voicemail .

## 2018-03-22 NOTE — Progress Notes (Signed)
[name] [name] for Vanco/Zosyn Indication: Cellulitis + osteo  Allergies  Allergen Reactions  . Adhesive [Tape] Other (See Comments)    CAUSES SKIN TEARS, prefers paper tape   . Percocet [Oxycodone-Acetaminophen] Other (See Comments)    Does not want to take  . Robaxin [Methocarbamol]     UNSPECIFIED REACTION   . Diovan [Valsartan] Rash    Patient Measurements: Height: 5\' 11"  (180.3 cm) Weight: 180 lb (81.6 kg) IBW/kg (Calculated) : 75.3 Adjusted Body Weight:    Vital Signs: Temp: 97.7 F (36.5 C) (07/25 0341) Temp Source: Oral (07/25 0341) BP: 134/62 (07/25 0341) Pulse Rate: 71 (07/25 0341) Intake/Output from previous day: 07/24 0701 - 07/25 0700 In: 360 [P.O.:360] Out: -  Intake/Output from this shift: Total I/O In: 120 [P.O.:120] Out: -   Labs: Recent Labs    03/20/18 1343 03/21/18 0512 03/22/18 0339  WBC 14.0* 10.4 12.3*  HGB 12.8* 11.2* 12.1*  PLT 254 238 267  CREATININE 1.28* 1.38* 1.55*   Estimated Creatinine Clearance: 31.7 mL/min (A) (by C-G formula based on SCr of 1.55 mg/dL (H)). No results for input(s): VANCOTROUGH, VANCOPEAK, VANCORANDOM, GENTTROUGH, GENTPEAK, GENTRANDOM, TOBRATROUGH, TOBRAPEAK, TOBRARND, AMIKACINPEAK, AMIKACINTROU, AMIKACIN in the last 72 hours.   Microbiology:   Medical History: Past Medical History:  Diagnosis Date  . Allergic rhinitis    Prior allergy shots 20 years  . Anemia   . Anemia, B12 deficiency 2000  . Arthritis    Status post left total replacement 8 2011  . BPH (benign prostatic hyperplasia) 2013  . Cancer ([place])    renal cell ca and skin cancer   . Colitis 2014  . Difficult intubation 1994   surgery had to be  stopped due to injury to "throat"  . Difficult intubation 1994   no trouble since.  Required nasotracheal intubation  '02 [facility] / ANESTHESIA RECORD FROM 2013 AND 2016 IN EPIC  . GERD (gastroesophageal reflux disease)   . Glaucoma 2007   Status post left trabeculectomy  2007  . History of colon polyps    Colonoscopy 2001  . History of kidney stones   . History of renal cell carcinoma 1994   Status post right nephrectomy  . Hyperlipidemia 2003  . Hypertension   . 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 Hypothyroidism (acquired) 2000  . Nocturia   . Osteomyelitis ([place])    right great  . Osteomyelitis ([place])    RIGHT FOOT /TOE  . Pituitary mass ([place]) 2000   S/p transphenoidal excision 05/1999 ([facility])  . Vitamin D deficiency 2009   Assessment: ID: Cellulitis + Osteo of R second toe confirmed per xray at Duda's office. Prob amp on Friday. Empiric vanc/zosyn. Asked TRH to add cultures Afebrile, WBC = 12.3 up (chronic low dose prednisone). Scr up 1.55 up  7/23 vanc>> 7/23 zosyn>>  7/23 blood>> 7/23 urine>>negative 7/23 PCR MRSA>>negative  Goal of Therapy:  Vancomycin trough level 15-20 mcg/ml  Plan:  TSH < 0.01, MD said f/u as outpatient right foot second toe amputation on Friday Increase Vancomycin dose to 1250mg  IV q 24h for osteo Zosyn 3.375g IV q 8 hrs Trough this weekend if continued   Braylin Formby S. Alford Highland, PharmD, BCPS Clinical Staff Pharmacist Pager [phone]  Eilene Ghazi Stillinger 03/22/2018,11:16 AM

## 2018-03-22 NOTE — Progress Notes (Signed)
Initial Nutrition Assessment  DOCUMENTATION CODES:   Not applicable  INTERVENTION:    Premier Protein po BID, each supplement provides 160 kcal and 30 grams of protein  NUTRITION DIAGNOSIS:   Increased nutrient needs related to wound healing as evidenced by estimated needs  GOAL:   Patient will meet greater than or equal to 90% of their needs  MONITOR:   PO intake, Supplement acceptance, Labs, Weight trends, Skin, I & O's  REASON FOR ASSESSMENT:   Malnutrition Screening Tool   ASSESSMENT:   84 yo Male with with cellulitis and osteomyelitis initially presented to the office yesterday.  Patient was admitted for IV antibiotics to minimize risk of septic joints with 3 total joint arthroplasties.  Patient is currently on IV antibiotics with significant decreased cellulitis.   Pt admitted for R foot infection.  RD spoke with pt at bedside. His wife is present. He reports a good appetite. Enjoyed his lunch today. States PTA he experienced a decrease in his appetite.   He reveals he was drinking Pure Protein supplements at home. Amenable to Premier Protein here in the hospital. Labs and medications reviewed. CBG's [phone].  Pt complained about not being able to open his packet of raisins at breakfast.  NUTRITION - FOCUSED PHYSICAL EXAM:  Completed. No muscle or fat depletions noticed.  Diet Order:   Diet Order           Diet Carb Modified Fluid consistency: Thin; Room service appropriate? Yes  Diet effective now         EDUCATION NEEDS:   No education needs have been identified at this time  Skin:  Skin Assessment: Skin Integrity Issues: Skin Integrity Issues:: Other (Comment) Other: great toe R foot  Last BM:  7/24  Height:   Ht Readings from Last 1 Encounters:  03/20/18 5\' 11"  (1.803 m)   Weight:   Wt Readings from Last 1 Encounters:  03/20/18 180 lb (81.6 kg)   BMI:  Body mass index is 25.1 kg/m.  Estimated Nutritional Needs:   Kcal:   1700-1900  Protein:  80-95 gm  Fluid:  1.7-1.9 L  Arthur Holms, RD, LDN Pager #: [phone] After-Hours Pager #: [phone]

## 2018-03-22 NOTE — Progress Notes (Signed)
PROGRESS NOTE    [name]  IHK:[id number] DOB: August 11, 1923 DOA: 03/20/2018 PCP: [name], DO   Brief Narrative:  HPI On 03/20/2018 by Dr. 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 Sharol Given with complaints of right lower extremity swelling, erythema, chills, low-grade fever, myalgias since Thursday. Patient noticed drainage and redness from his second right toe,and redness moving up his foot into his right lower extremity. He also noticed worsening dependent edema and swelling. He started experiencing low-grade fever of around 100 degrees Fahrenheit which  he has been trying to keep under control with prn Tylenol.patient also complains of chills, rigors, during this interview. He also complains of being queasy and nauseous. He denies any chest pain or shortness of breath.  Interim history Admitted for osteomyelitis of the right 2nd toe along with cellulitis. Placed on zosyn and vancomycin. Dr. Sharol Given, consulted, plans for surgery on 7/26.  Assessment & Plan   Osteomyelitis of the right second toe along with cellulitis -Patient placed on vancomycin and Zosyn -Orthopedics, Dr. Sharol Given, consulted and appreciated, plan for surgery on 03/23/2018 -Continue IV fluids -Right Lower extremity Doppler negative  -Echocardiogram EF 75-64%, grade 1 diastolic dysfunction -Blood cultures show no growth to date  Possible Adrenal insufficiency -Patient takes chronic prednisone as well as testosterone injections which have been held -Continue prednisone, currently blood pressure within normal limits and stable -May need to be  placed on stress dose steroids -Cortisol level within normal limits  Hypothyroidism -TSH <0.10 -FT4 1.08 -Continue Synthroid; patient will need to follow up with PCP as an outpatient   Dyslipidemia -Continue  statin  Chronic kidney disease, stage III -Creatinine appears to be at baseline, currently 1.55 -Continue to monitor BMP   DVT Prophylaxis  Heparin  Code Status: Full  Family Communication: None at bedside  Disposition Plan:Admitted. Pending surgery on 7/26  Consultants Orthopedics, Dr.Duda  Procedures  None  Antibiotics   Anti-infectives (From admission, onward)   Start     Dose/Rate Route Frequency Ordered Stop   03/21/18 1800  vancomycin (VANCOCIN) IVPB 750 mg/150 ml premix     750 mg 150 mL/hr over 60 Minutes Intravenous Every 24 hours 03/20/18 1504     03/20/18 1700  vancomycin (VANCOCIN) 1,500 mg in sodium chloride 0.9 % 500 mL IVPB     1,500 mg 250 mL/hr over 120 Minutes Intravenous  Once 03/20/18 1455 03/20/18 2038   03/20/18 1700  piperacillin-tazobactam (ZOSYN) IVPB 3.375 g     3.375 g 12.5 mL/hr over 240 Minutes Intravenous Every 8 hours 03/20/18 1455        Subjective:   Ollen Bowl seen and examined today.  Patient has no complaints today. States he is ready for his surgery. Denies chest pain, shortness of breath, abdominal pain, nausea, vomiting, diarrhea, constipation, dizziness, headache.   Objective:   Vitals:   03/21/18 0427 03/21/18 1413 03/21/18 1958 03/22/18 0341  BP: (!) 136/55 (!) 128/48 (!) 101/48 134/62  Pulse: 79 79 81 71  Resp: 16 16 18 16   Temp: 99.2 F (37.3 C) 98.1 F (36.7 C) 98.3 F (36.8 C) 97.7 F (36.5 C)  TempSrc: Oral Oral Oral Oral  SpO2: 96% 96% 94% 100%  Weight:      Height:        Intake/Output Summary (Last 24 hours) at 03/22/2018 3329 Last data filed at 03/21/2018  1721 Gross per 24 hour  Intake 240 ml  Output -  Net 240 ml   Filed Weights   03/20/18 1450  Weight: 81.6 kg (180 lb)   Exam  General: Well developed, well nourished, NAD, appears stated age  HEENT: NCAT, mucous membranes moist.   Neck: Supple  Cardiovascular: S1 S2 auscultated, no rubs, murmurs or gallops. Regular rate and  rhythm.  Respiratory: Clear to auscultation bilaterally with equal chest rise  Abdomen: Soft, nontender, nondistended, + bowel sounds  Extremities: warm dry without cyanosis clubbing or edema of LLE. RLE with mild erythema, s/p great toe amputation, ulceration of second toe tip  Neuro: AAOx3, nonfocal  Skin: Without rashes exudates or nodules  Psych: Normal affect and demeanor with intact judgement and insight, pleasant  Data Reviewed: I have personally reviewed following labs and imaging studies  CBC: Recent Labs  Lab 03/20/18 1343 03/21/18 0512 03/22/18 0339  WBC 14.0* 10.4 12.3*  HGB 12.8* 11.2* 12.1*  HCT 39.3 34.5* 37.0*  MCV 98.7 98.3 98.7  PLT 254 238 106   Basic Metabolic Panel: Recent Labs  Lab 03/20/18 1343 03/21/18 0512 03/22/18 0339  NA 135 137 137  K 4.1 3.8 4.2  CL 100 102 101  CO2 26 29 28   GLUCOSE 85 83 111*  BUN 16 12 13   CREATININE 1.28* 1.38* 1.55*  CALCIUM 9.4 8.8* 9.2  MG 2.0  --   --    GFR: Estimated Creatinine Clearance: 31.7 mL/min (A) (by C-G formula based on SCr of 1.55 mg/dL (H)). Liver Function Tests: Recent Labs  Lab 03/20/18 1343 03/21/18 0512  AST 17 15  ALT 15 14  ALKPHOS 49 43  BILITOT 0.8 1.0  PROT 6.5 5.3*  ALBUMIN 3.2* 2.4*   No results for input(s): LIPASE, AMYLASE in the last 168 hours. No results for input(s): AMMONIA in the last 168 hours. Coagulation Profile: No results for input(s): INR, PROTIME in the last 168 hours. Cardiac Enzymes: Recent Labs  Lab 03/20/18 1343 03/20/18 1943  TROPONINI <0.03 <0.03   BNP (last 3 results) No results for input(s): PROBNP in the last 8760 hours. HbA1C: Recent Labs    03/20/18 1349  HGBA1C 6.5*   CBG: Recent Labs  Lab 03/21/18 0625 03/21/18 1134 03/21/18 1620 03/21/18 2118 03/22/18 0612  GLUCAP 77 95 210* 139* 92   Lipid Profile: No results for input(s): CHOL, HDL, LDLCALC, TRIG, CHOLHDL, LDLDIRECT in the last 72 hours. Thyroid Function Tests: Recent  Labs    03/20/18 1602 03/22/18 0339  TSH <0.010*  --   FREET4  --  1.08   Anemia Panel: No results for input(s): VITAMINB12, FOLATE, FERRITIN, TIBC, IRON, RETICCTPCT in the last 72 hours. Urine analysis:    Component Value Date/Time   COLORURINE YELLOW 11/29/2017 1210   APPEARANCEUR CLEAR 11/29/2017 1210   LABSPEC 1.010 11/29/2017 1210   PHURINE 7.0 11/29/2017 1210   GLUCOSEU NEGATIVE 11/29/2017 1210   HGBUR NEGATIVE 11/29/2017 1210   BILIRUBINUR NEGATIVE 11/29/2017 1210   KETONESUR NEGATIVE 11/29/2017 1210   PROTEINUR NEGATIVE 11/29/2017 1210   UROBILINOGEN 0.2 06/24/2015 1134   NITRITE NEGATIVE 11/29/2017 1210   LEUKOCYTESUR NEGATIVE 11/29/2017 1210   Sepsis Labs: @LABRCNTIP (procalcitonin:4,lacticidven:4)  ) Recent Results (from the past 240 hour(s))  Urine Culture     Status: None   Collection Time: 03/20/18 12:42 PM  Result Value Ref Range Status   Specimen Description URINE, RANDOM  Final   Special Requests NONE  Final   Culture  Final    NO GROWTH Performed at [facility], [place] [address], [place], [place] [address]    Report Status 03/22/2018 FINAL  Final  MRSA PCR Screening     Status: None   Collection Time: 03/20/18  2:54 PM  Result Value Ref Range Status   MRSA by PCR NEGATIVE NEGATIVE Final    Comment:        The GeneXpert MRSA Assay (FDA approved for NASAL specimens only), is one component of a comprehensive MRSA colonization surveillance program. It is not intended to diagnose MRSA infection nor to guide or monitor treatment for MRSA infections. Performed at [facility], [place] [address], [place], [place] [address]   Culture, blood (Routine X 2) w Reflex to ID Panel     Status: None (Preliminary result)   Collection Time: 03/20/18  3:53 PM  Result Value Ref Range Status   Specimen Description BLOOD RIGHT ARM  Final   Special Requests   Final    BOTTLES DRAWN AEROBIC AND ANAEROBIC Blood Culture adequate volume   Culture   Final     NO GROWTH < 24 HOURS Performed at [facility], [place] [address] [place], [place] [address]    Report Status PENDING  Incomplete  Culture, blood (Routine X 2) w Reflex to ID Panel     Status: None (Preliminary result)   Collection Time: 03/20/18  4:00 PM  Result Value Ref Range Status   Specimen Description BLOOD LEFT ANTECUBITAL  Final   Special Requests   Final    BOTTLES DRAWN AEROBIC AND ANAEROBIC Blood Culture adequate volume   Culture   Final    NO GROWTH < 24 HOURS Performed at [facility], [place] [address] [place] [address] [place],  [address]    Report Status PENDING  Incomplete      Radiology Studies: Xr Foot Complete Right  Result Date: 03/20/2018 3 view radiographs of the right foot shows destructive bony changes of the second toe.    Scheduled Meds: . feeding supplement (ENSURE ENLIVE)  237 mL Oral BID BM  . finasteride  5 mg Oral q morning - 10a  . heparin  5,000 Units Subcutaneous Q8H  . insulin aspart  0-9 Units Subcutaneous TID WC  . levothyroxine  88 mcg Oral QAC breakfast  . pravastatin  80 mg Oral QPM  . predniSONE  5 mg Oral q morning - 10a  . vitamin B-12  500 mcg Oral Daily   Continuous Infusions: . piperacillin-tazobactam (ZOSYN)  IV 3.375 g (03/22/18 0949)  . vancomycin 750 mg (03/21/18 1721)     LOS: 2 days   Time Spent in minutes   30 minutes  Therisa Mennella D.O. on 03/22/2018 at 9:58 AM  Between 7am to 7pm - Pager - [phone]  After 7pm go to [email or web address] - password [email or web address]  And look for the night coverage person covering for me after hours  [facility] Office  [phone]

## 2018-03-23 ENCOUNTER — Inpatient Hospital Stay (HOSPITAL_COMMUNITY): Payer: PPO | Admitting: Certified Registered Nurse Anesthetist

## 2018-03-23 ENCOUNTER — Encounter (HOSPITAL_COMMUNITY): Payer: Self-pay | Admitting: Orthopedic Surgery

## 2018-03-23 ENCOUNTER — Encounter (HOSPITAL_COMMUNITY)
Admission: AD | Disposition: A | Discharge status: Skilled Nursing Facility | Payer: Self-pay | Source: Home / Self Care | Attending: Internal Medicine

## 2018-03-23 HISTORY — PX: AMPUTATION: SHX166

## 2018-03-23 LAB — CBC
HCT: 35.9 % — ABNORMAL LOW (ref 39.0–52.0)
Hemoglobin: 11.5 g/dL — ABNORMAL LOW (ref 13.0–17.0)
MCH: 31.6 pg (ref 26.0–34.0)
MCHC: 32 g/dL (ref 30.0–36.0)
MCV: 98.6 fL (ref 78.0–100.0)
PLATELETS: 286 10*3/uL (ref 150–400)
RBC: 3.64 MIL/uL — ABNORMAL LOW (ref 4.22–5.81)
RDW: 14.3 % (ref 11.5–15.5)
WBC: 11.6 10*3/uL — ABNORMAL HIGH (ref 4.0–10.5)

## 2018-03-23 LAB — BASIC METABOLIC PANEL
Anion gap: 8 (ref 5–15)
BUN: 17 mg/dL (ref 8–23)
CALCIUM: 9 mg/dL (ref 8.9–10.3)
CO2: 27 mmol/L (ref 22–32)
CREATININE: 1.46 mg/dL — AB (ref 0.61–1.24)
Chloride: 104 mmol/L (ref 98–111)
GFR, EST AFRICAN AMERICAN: 46 mL/min — AB (ref >60)
GFR, EST NON AFRICAN AMERICAN: 40 mL/min — AB (ref >60)
Glucose, Bld: 108 mg/dL — ABNORMAL HIGH (ref 70–99)
Potassium: 3.9 mmol/L (ref 3.5–5.1)
Sodium: 139 mmol/L (ref 135–145)

## 2018-03-23 LAB — GLUCOSE, CAPILLARY
GLUCOSE-CAPILLARY: 92 mg/dL (ref 70–99)
GLUCOSE-CAPILLARY: 79 mg/dL (ref 70–99)
Glucose-Capillary: 84 mg/dL (ref 70–99)
Glucose-Capillary: 132 mg/dL — ABNORMAL HIGH (ref 70–99)
Glucose-Capillary: 104 mg/dL — ABNORMAL HIGH (ref 70–99)

## 2018-03-23 SURGERY — AMPUTATION DIGIT
Anesthesia: General | Site: Foot | Laterality: Right

## 2018-03-23 MED ORDER — BISACODYL 10 MG RE SUPP: 10.0000 mg | Freq: Every day | RECTAL | Status: DC | PRN

## 2018-03-23 MED ORDER — FENTANYL CITRATE (PF) 250 MCG/5ML IJ SOLN
INTRAMUSCULAR | Status: AC
  Filled 2018-03-23: qty 5

## 2018-03-23 MED ORDER — HYDROMORPHONE HCL 1 MG/ML IJ SOLN: 0.2500 mg | INTRAMUSCULAR | Status: DC | PRN

## 2018-03-23 MED ORDER — METHOCARBAMOL 500 MG PO TABS
500.0000 mg | ORAL_TABLET | Freq: Four times a day (QID) | ORAL | Status: DC | PRN
  Administered 2018-03-24: 500 mg via ORAL
  Filled 2018-03-23: qty 1

## 2018-03-23 MED ORDER — PROPOFOL 10 MG/ML IV BOLUS
INTRAVENOUS | Status: AC
  Filled 2018-03-23: qty 40

## 2018-03-23 MED ORDER — HYDROCODONE-ACETAMINOPHEN 5-325 MG PO TABS
1.0000 | ORAL_TABLET | ORAL | Status: DC | PRN
  Filled 2018-03-23: qty 1

## 2018-03-23 MED ORDER — LIDOCAINE 2% (20 MG/ML) 5 ML SYRINGE
INTRAMUSCULAR | Status: AC
  Filled 2018-03-23: qty 5

## 2018-03-23 MED ORDER — DOCUSATE SODIUM 100 MG PO CAPS
100.0000 mg | ORAL_CAPSULE | Freq: Two times a day (BID) | ORAL | Status: DC
  Administered 2018-03-23: 100 mg via ORAL
  Filled 2018-03-23 (×2): qty 1

## 2018-03-23 MED ORDER — ONDANSETRON HCL 4 MG PO TABS: 4.0000 mg | ORAL_TABLET | Freq: Four times a day (QID) | ORAL | Status: DC | PRN

## 2018-03-23 MED ORDER — LIDOCAINE HCL (PF) 1 % IJ SOLN
INTRAMUSCULAR | Status: AC
  Filled 2018-03-23: qty 30

## 2018-03-23 MED ORDER — ACETAMINOPHEN 325 MG PO TABS: 325.0000 mg | ORAL_TABLET | Freq: Four times a day (QID) | ORAL | Status: DC | PRN

## 2018-03-23 MED ORDER — PROPOFOL 10 MG/ML IV BOLUS
INTRAVENOUS | Status: DC | PRN
  Administered 2018-03-23: 20 mg via INTRAVENOUS
  Administered 2018-03-23: 10 mg via INTRAVENOUS

## 2018-03-23 MED ORDER — METOCLOPRAMIDE HCL 5 MG PO TABS: 5.0000 mg | ORAL_TABLET | Freq: Three times a day (TID) | ORAL | Status: DC | PRN

## 2018-03-23 MED ORDER — LIDOCAINE 2% (20 MG/ML) 5 ML SYRINGE
INTRAMUSCULAR | Status: DC | PRN
  Administered 2018-03-23: 40 mg via INTRAVENOUS

## 2018-03-23 MED ORDER — MAGNESIUM CITRATE PO SOLN: 1.0000 | Freq: Once | ORAL | Status: DC | PRN

## 2018-03-23 MED ORDER — CEFAZOLIN SODIUM-DEXTROSE 2-4 GM/100ML-% IV SOLN
2.0000 g | INTRAVENOUS | Status: AC
  Administered 2018-03-23: 2 g via INTRAVENOUS

## 2018-03-23 MED ORDER — SODIUM CHLORIDE 0.9 % IV SOLN
INTRAVENOUS | Status: DC
  Administered 2018-03-23: 12:00:00 via INTRAVENOUS

## 2018-03-23 MED ORDER — 0.9 % SODIUM CHLORIDE (POUR BTL) OPTIME
TOPICAL | Status: DC | PRN
  Administered 2018-03-23: 1000 mL

## 2018-03-23 MED ORDER — ALUM & MAG HYDROXIDE-SIMETH 200-200-20 MG/5ML PO SUSP
15.0000 mL | ORAL | Status: DC | PRN
  Administered 2018-03-23: 15 mL via ORAL
  Filled 2018-03-23: qty 30

## 2018-03-23 MED ORDER — METHOCARBAMOL 1000 MG/10ML IJ SOLN
500.0000 mg | Freq: Four times a day (QID) | INTRAVENOUS | Status: DC | PRN
  Filled 2018-03-23: qty 5

## 2018-03-23 MED ORDER — ONDANSETRON HCL 4 MG/2ML IJ SOLN
INTRAMUSCULAR | Status: AC
  Filled 2018-03-23: qty 2

## 2018-03-23 MED ORDER — METOCLOPRAMIDE HCL 5 MG/ML IJ SOLN: 5.0000 mg | Freq: Three times a day (TID) | INTRAMUSCULAR | Status: DC | PRN

## 2018-03-23 MED ORDER — PROMETHAZINE HCL 25 MG/ML IJ SOLN: 6.2500 mg | INTRAMUSCULAR | Status: DC | PRN

## 2018-03-23 MED ORDER — CEFAZOLIN SODIUM-DEXTROSE 2-4 GM/100ML-% IV SOLN
INTRAVENOUS | Status: AC
  Filled 2018-03-23: qty 100

## 2018-03-23 MED ORDER — POLYETHYLENE GLYCOL 3350 17 G PO PACK: 17.0000 g | PACK | Freq: Every day | ORAL | Status: DC | PRN

## 2018-03-23 MED ORDER — LIDOCAINE HCL (PF) 1 % IJ SOLN
INTRAMUSCULAR | Status: DC | PRN
  Administered 2018-03-23: 15 mL

## 2018-03-23 MED ORDER — ONDANSETRON HCL 4 MG/2ML IJ SOLN: 4.0000 mg | Freq: Four times a day (QID) | INTRAMUSCULAR | Status: DC | PRN

## 2018-03-23 MED ORDER — HYDROCODONE-ACETAMINOPHEN 7.5-325 MG PO TABS: 1.0000 | ORAL_TABLET | ORAL | Status: DC | PRN

## 2018-03-23 MED ORDER — MORPHINE SULFATE (PF) 2 MG/ML IV SOLN: 0.5000 mg | INTRAVENOUS | Status: DC | PRN

## 2018-03-23 MED ORDER — PANTOPRAZOLE SODIUM 40 MG PO TBEC
40.0000 mg | DELAYED_RELEASE_TABLET | Freq: Every day | ORAL | Status: DC
Start: 2018-03-23 — End: 2018-03-24
  Administered 2018-03-24: 40 mg via ORAL
  Filled 2018-03-23: qty 1

## 2018-03-23 MED ORDER — LACTATED RINGERS IV SOLN
INTRAVENOUS | Status: DC
  Administered 2018-03-23: 10:00:00 via INTRAVENOUS

## 2018-03-23 MED ORDER — FENTANYL CITRATE (PF) 100 MCG/2ML IJ SOLN
INTRAMUSCULAR | Status: DC | PRN
  Administered 2018-03-23 (×2): 25 ug via INTRAVENOUS

## 2018-03-23 SURGICAL SUPPLY — 29 items
BLADE SURG 21 STRL SS (BLADE) ×3 IMPLANT
BNDG CMPR 9X4 STRL LF SNTH (GAUZE/BANDAGES/DRESSINGS)
BNDG COHESIVE 4X5 TAN STRL (GAUZE/BANDAGES/DRESSINGS) ×3 IMPLANT
BNDG ESMARK 4X9 LF (GAUZE/BANDAGES/DRESSINGS) IMPLANT
BNDG GAUZE ELAST 4 BULKY (GAUZE/BANDAGES/DRESSINGS) ×3 IMPLANT
COVER SURGICAL LIGHT HANDLE (MISCELLANEOUS) ×6 IMPLANT
DRAPE U-SHAPE 47X51 STRL (DRAPES) ×3 IMPLANT
DRSG ADAPTIC 3X8 NADH LF (GAUZE/BANDAGES/DRESSINGS) ×2 IMPLANT
DRSG PAD ABDOMINAL 8X10 ST (GAUZE/BANDAGES/DRESSINGS) ×3 IMPLANT
DURAPREP 26ML APPLICATOR (WOUND CARE) ×3 IMPLANT
ELECT REM PT RETURN 9FT ADLT (ELECTROSURGICAL) ×3
ELECTRODE REM PT RTRN 9FT ADLT (ELECTROSURGICAL) ×1 IMPLANT
GAUZE SPONGE 4X4 12PLY STRL (GAUZE/BANDAGES/DRESSINGS) ×2 IMPLANT
GLOVE BIOGEL PI IND STRL 9 (GLOVE) ×1 IMPLANT
GLOVE BIOGEL PI INDICATOR 9 (GLOVE) ×2
GLOVE SURG ORTHO 9.0 STRL STRW (GLOVE) ×3 IMPLANT
GOWN STRL REUS W/ TWL XL LVL3 (GOWN DISPOSABLE) ×2 IMPLANT
GOWN STRL REUS W/TWL XL LVL3 (GOWN DISPOSABLE) ×6
KIT BASIN OR (CUSTOM PROCEDURE TRAY) ×3 IMPLANT
KIT TURNOVER KIT B (KITS) ×3 IMPLANT
MANIFOLD NEPTUNE II (INSTRUMENTS) ×3 IMPLANT
NEEDLE 22X1 1/2 (OR ONLY) (NEEDLE) IMPLANT
NS IRRIG 1000ML POUR BTL (IV SOLUTION) ×3 IMPLANT
PACK ORTHO EXTREMITY (CUSTOM PROCEDURE TRAY) ×3 IMPLANT
PAD ABD 8X10 STRL (GAUZE/BANDAGES/DRESSINGS) ×2 IMPLANT
PAD ARMBOARD 7.5X6 YLW CONV (MISCELLANEOUS) ×6 IMPLANT
SUT ETHILON 2 0 PSLX (SUTURE) ×3 IMPLANT
SYR CONTROL 10ML LL (SYRINGE) IMPLANT
TOWEL OR 17X26 10 PK STRL BLUE (TOWEL DISPOSABLE) ×3 IMPLANT

## 2018-03-23 NOTE — Plan of Care (Signed)

## 2018-03-23 NOTE — Anesthesia Preprocedure Evaluation (Signed)
Anesthesia Evaluation  Patient identified by MRN, date of birth, ID band Patient awake    Reviewed: Allergy & Precautions, NPO status , Patient's Chart, lab work & pertinent test results  Airway Mallampati: III  TM Distance: >3 FB   Mouth opening: Limited Mouth Opening  Dental  (+) Teeth Intact   Pulmonary former smoker,    breath sounds clear to auscultation       Cardiovascular hypertension,  Rhythm:Regular Rate:Normal     Neuro/Psych    GI/Hepatic   Endo/Other    Renal/GU      Musculoskeletal   Abdominal   Peds  Hematology   Anesthesia Other Findings   Reproductive/Obstetrics                             Anesthesia Physical  Anesthesia Plan  ASA: III  Anesthesia Plan: General   Post-op Pain Management:    Induction: Intravenous  PONV Risk Score and Plan: 2 and Ondansetron and Treatment may vary due to age or medical condition  Airway Management Planned: LMA  Additional Equipment:   Intra-op Plan:   Post-operative Plan:   Informed Consent: I have reviewed the patients History and Physical, chart, labs and discussed the procedure including the risks, benefits and alternatives for the proposed anesthesia with the patient or authorized representative who has indicated his/her understanding and acceptance.   Dental advisory given  Plan Discussed with: CRNA and Anesthesiologist  Anesthesia Plan Comments:         Anesthesia Quick Evaluation

## 2018-03-23 NOTE — Op Note (Signed)
03/23/2018  10:40 AM  PATIENT:  [name] [name].    PRE-OPERATIVE DIAGNOSIS:  right 2nd toe osteomyelitis  POST-OPERATIVE DIAGNOSIS:  Same  PROCEDURE:  RIGHT 2ND TOE AMPUTATION  SURGEON:  [name], MD  PHYSICIAN ASSISTANT:None ANESTHESIA:   General  PREOPERATIVE INDICATIONS:  [name] [name]. is a  84 y.o. male with a diagnosis of right 2nd toe osteomyelitis who failed conservative measures and elected for surgical management.    The risks benefits and alternatives were discussed with the patient preoperatively including but not limited to the risks of infection, bleeding, nerve injury, cardiopulmonary complications, the need for revision surgery, among others, and the patient was willing to proceed.  OPERATIVE IMPLANTS: None  @ENCIMAGES @  OPERATIVE FINDINGS: Good petechial bleeding no abscess patient was brought to the operating room and underwent a general and  OPERATIVE PROCEDURE: Patient was brought to the operating room on the patient bed.  The right lower extremity was prepped using DuraPrep draped in the sterile field a timeout was called.  15 cc of 1% lidocaine plain was used to provide a digital block.  After adequate levels of anesthesia were obtained the toe was amputated through the MTP joint with a fishmouth incision just distal to the joint.  The wound was irrigated with normal saline the electrocautery was used for hemostasis.  Incision was closed using 2-0 nylon a sterile dressing was applied patient was taken the PACU in stable condition.   DISCHARGE PLANNING:  Antibiotic duration: No further antibiotics needed  Weightbearing: Touchdown weightbearing on the right  Pain medication: Tylenol for pain  Dressing care/ Wound VAC: Keep dressing clean dry and intact for 1 week  Ambulatory devices: Walker.  Discharge to: Patient may discharge to [facility] at this time.  Follow-up: In the office 1 week post  operative.

## 2018-03-23 NOTE — Interval H&P Note (Signed)
History and Physical Interval Note:  03/23/2018 6:37 AM  [name] [name].  has presented today for surgery, with the diagnosis of right 2nd toe osteomyelitis  The various methods of treatment have been discussed with the patient and family. After consideration of risks, benefits and other options for treatment, the patient has consented to  Procedure(s): RIGHT 2ND TOE AMPUTATION (Right) as a surgical intervention .  The patient's history has been reviewed, patient examined, no change in status, stable for surgery.  I have reviewed the patient's chart and labs.  Questions were answered to the patient's satisfaction.     [name]

## 2018-03-23 NOTE — Transfer of Care (Signed)
Immediate Anesthesia Transfer of Care Note  Patient: [name] [name].  Procedure(s) Performed: RIGHT 2ND TOE AMPUTATION (Right Foot)  Patient Location: PACU  Anesthesia Type:MAC  Level of Consciousness: awake, alert  and oriented  Airway & Oxygen Therapy: Patient Spontanous Breathing and Patient connected to nasal cannula oxygen  Post-op Assessment: Report given to RN and Post -op Vital signs reviewed and stable  Post vital signs: Reviewed and stable  Last Vitals:  Vitals Value Taken Time  BP 155/77 03/23/2018 10:45 AM  Temp    Pulse 68 03/23/2018 10:46 AM  Resp 12 03/23/2018 10:46 AM  SpO2 100 % 03/23/2018 10:46 AM  Vitals shown include unvalidated device data.  Last Pain:  Vitals:   03/22/18 2011  TempSrc: Oral  PainSc: 0-No pain         Complications: No apparent anesthesia complications

## 2018-03-23 NOTE — Consult Note (Signed)
   [facility] CM Inpatient Consult   03/23/2018  [name] [name]. Mar 17, 1923 [id number]   Patient screened for multiple admissions in the past 6 months but with a Medium risk score for unplanned admissions [20%] [place] [facility] services with [employer] plan. Patient resides at [facility] noted.  This facility has all levels of care available.  Current disposition has not been determined.   Chart review reveals patient is just retuning from a surgical procedure.  Please place a [facility] Care Management consult or for questions contact:   Natividad Brood, RN BSN [facility] Liaison  [phone] business mobile phone Toll free office [phone]

## 2018-03-23 NOTE — Plan of Care (Signed)

## 2018-03-23 NOTE — Progress Notes (Signed)
PROGRESS NOTE    [name] [name].  MBW:[id number] DOB: Oct 21, 1922 DOA: 03/20/2018 PCP: [name], DO   Brief Narrative:  HPI On 03/20/2018 by Dr. 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 Sharol Given with complaints of right lower extremity swelling, erythema, chills, low-grade fever, myalgias since Thursday. Patient noticed drainage and redness from his second right toe,and redness moving up his foot into his right lower extremity. He also noticed worsening dependent edema and swelling. He started experiencing low-grade fever of around 100 degrees Fahrenheit which  he has been trying to keep under control with prn Tylenol.patient also complains of chills, rigors, during this interview. He also complains of being queasy and nauseous. He denies any chest pain or shortness of breath.  Interim history Admitted for osteomyelitis of the right 2nd toe along with cellulitis. Placed on zosyn and vancomycin. Dr. Sharol Given, consulted, s/p amputation today.   Assessment & Plan   Osteomyelitis of the right second toe along with cellulitis -Patient placed on vancomycin and Zosyn -Orthopedics, Dr. Sharol Given, consulted and appreciated, s/p right 2nd toe amputation -Continue IV fluids -Right Lower extremity Doppler negative  -Echocardiogram EF 01-77%, grade 1 diastolic dysfunction -Blood cultures show no growth to date -PT and OT consulted for evaluation   Possible Adrenal insufficiency -Patient takes chronic prednisone as well as testosterone injections which have been held -Continue prednisone, currently blood pressure within normal limits and stable -May need to be  placed on stress dose steroids -Cortisol level within normal limits  Hypothyroidism -TSH <0.10 -FT4 1.08 -Continue Synthroid; patient will need to follow up with PCP as an outpatient    Dyslipidemia -Continue statin  Chronic kidney disease, stage III -Creatinine appears to be at baseline, currently 1.46 -Continue to monitor BMP  DVT Prophylaxis  Heparin  Code Status: Full  Family Communication: None at bedside  Disposition Plan:Admitted. Pending surgery on 7/26  Consultants Orthopedics, Dr.Duda  Procedures  right 2nd toe amputation  Antibiotics   Anti-infectives (From admission, onward)   Start     Dose/Rate Route Frequency Ordered Stop   03/23/18 0945  ceFAZolin (ANCEF) IVPB 2g/100 mL premix     2 g 200 mL/hr over 30 Minutes Intravenous On call to O.R. 03/23/18 0933 03/23/18 1024   03/23/18 0936  ceFAZolin (ANCEF) 2-4 GM/100ML-% IVPB    Note to Pharmacy:  Grace Blight   : cabinet override      03/23/18 0936 03/23/18 1024   03/22/18 1800  vancomycin (VANCOCIN) 1,250 mg in sodium chloride 0.9 % 250 mL IVPB     1,250 mg 166.7 mL/hr over 90 Minutes Intravenous Every 24 hours 03/22/18 1115     03/21/18 1800  vancomycin (VANCOCIN) IVPB 750 mg/150 ml premix  Status:  Discontinued     750 mg 150 mL/hr over 60 Minutes Intravenous Every 24 hours 03/20/18 1504 03/22/18 1116   03/20/18 1700  vancomycin (VANCOCIN) 1,500 mg in sodium chloride 0.9 % 500 mL IVPB     1,500 mg 250 mL/hr over 120 Minutes Intravenous  Once 03/20/18 1455 03/20/18 2038   03/20/18 1700  piperacillin-tazobactam (ZOSYN) IVPB 3.375 g     3.375 g 12.5 mL/hr over 240 Minutes Intravenous Every 8 hours 03/20/18 1455        Subjective:   [name] [name] seen and examined today.  Patient has no complaints today. Denies current chest pain,  shortness of breath, abdominal pain, nausea, vomiting, diarrhea, constipation, headache, dizziness. States he will need to go to rehab.   Objective:   Vitals:   03/23/18 1100 03/23/18 1115 03/23/18 1130 03/23/18 1208  BP: (!) 150/67 (!) 147/61 (!) 154/68 127/67  Pulse: 65 69 64 65  Resp: 13 17 10 18   Temp:   97.9 F (36.6 C) 98 F (36.7 C)  TempSrc:     Oral  SpO2: 99% 94% 96% 97%  Weight:      Height:        Intake/Output Summary (Last 24 hours) at 03/23/2018 1351 Last data filed at 03/23/2018 1134 Gross per 24 hour  Intake 3027.1 ml  Output 810 ml  Net 2217.1 ml   Filed Weights   03/20/18 1450 03/23/18 0946  Weight: 81.6 kg (180 lb) 81.6 kg (180 lb)   Exam  General: Well developed, well nourished, NAD, appears stated age  79: NCAT,mucous membranes moist.   Neck: Supple  Cardiovascular: S1 S2 auscultated, no murmurs, RRR  Respiratory: Clear to auscultation bilaterally with equal chest rise  Abdomen: Soft, nontender, nondistended, + bowel sounds  Extremities: warm dry without cyanosis clubbing or edema of LLE. RLE edema with mild erythema, dressing in place  Neuro: AAOx3, nonfocal  Skin: Without rashes exudates or nodules  Psych: Normal affect and demeanor with intact judgement and insight, pleasant   Data Reviewed: I have personally reviewed following labs and imaging studies  CBC: Recent Labs  Lab 03/20/18 1343 03/21/18 0512 03/22/18 0339 03/23/18 0400  WBC 14.0* 10.4 12.3* 11.6*  HGB 12.8* 11.2* 12.1* 11.5*  HCT 39.3 34.5* 37.0* 35.9*  MCV 98.7 98.3 98.7 98.6  PLT 254 238 267 505   Basic Metabolic Panel: Recent Labs  Lab 03/20/18 1343 03/21/18 0512 03/22/18 0339 03/23/18 0400  NA 135 137 137 139  K 4.1 3.8 4.2 3.9  CL 100 102 101 104  CO2 26 29 28 27   GLUCOSE 85 83 111* 108*  BUN 16 12 13 17   CREATININE 1.28* 1.38* 1.55* 1.46*  CALCIUM 9.4 8.8* 9.2 9.0  MG 2.0  --   --   --    GFR: Estimated Creatinine Clearance: 33.7 mL/min (A) (by C-G formula based on SCr of 1.46 mg/dL (H)). Liver Function Tests: Recent Labs  Lab 03/20/18 1343 03/21/18 0512  AST 17 15  ALT 15 14  ALKPHOS 49 43  BILITOT 0.8 1.0  PROT 6.5 5.3*  ALBUMIN 3.2* 2.4*   No results for input(s): LIPASE, AMYLASE in the last 168 hours. No results for input(s): AMMONIA in the last 168 hours. Coagulation Profile: No  results for input(s): INR, PROTIME in the last 168 hours. Cardiac Enzymes: Recent Labs  Lab 03/20/18 1343 03/20/18 1943  TROPONINI <0.03 <0.03   BNP (last 3 results) No results for input(s): PROBNP in the last 8760 hours. HbA1C: No results for input(s): HGBA1C in the last 72 hours. CBG: Recent Labs  Lab 03/22/18 1619 03/22/18 2012 03/23/18 0626 03/23/18 0947 03/23/18 1208  GLUCAP 193* 179* 92 79 84   Lipid Profile: No results for input(s): CHOL, HDL, LDLCALC, TRIG, CHOLHDL, LDLDIRECT in the last 72 hours. Thyroid Function Tests: Recent Labs    03/20/18 1602 03/22/18 0339  TSH <0.010*  --   FREET4  --  1.08   Anemia Panel: No results for input(s): VITAMINB12, FOLATE, FERRITIN, TIBC, IRON, RETICCTPCT in the last 72 hours. Urine analysis:    Component Value Date/Time   COLORURINE YELLOW 11/29/2017 1210  APPEARANCEUR CLEAR 11/29/2017 1210   LABSPEC 1.010 11/29/2017 1210   PHURINE 7.0 11/29/2017 1210   GLUCOSEU NEGATIVE 11/29/2017 1210   HGBUR NEGATIVE 11/29/2017 1210   BILIRUBINUR NEGATIVE 11/29/2017 1210   KETONESUR NEGATIVE 11/29/2017 1210   PROTEINUR NEGATIVE 11/29/2017 1210   UROBILINOGEN 0.2 06/24/2015 1134   NITRITE NEGATIVE 11/29/2017 1210   LEUKOCYTESUR NEGATIVE 11/29/2017 1210   Sepsis Labs: @LABRCNTIP (procalcitonin:4,lacticidven:4)  ) Recent Results (from the past 240 hour(s))  Urine Culture     Status: None   Collection Time: 03/20/18 12:42 PM  Result Value Ref Range Status   Specimen Description URINE, RANDOM  Final   Special Requests NONE  Final   Culture   Final    NO GROWTH Performed at [facility], [place] [address] [place], [place]  [address]    Report Status 03/22/2018 FINAL  Final  MRSA PCR Screening     Status: None   Collection Time: 03/20/18  2:54 PM  Result Value Ref Range Status   MRSA by PCR NEGATIVE NEGATIVE Final    Comment:        The GeneXpert MRSA Assay (FDA approved for NASAL specimens only), is one component of  a comprehensive MRSA colonization surveillance program. It is not intended to diagnose MRSA infection nor to guide or monitor treatment for MRSA infections. Performed at [facility], [place] [address] [place], [place] [address]   Culture, blood (Routine X 2) w Reflex to ID Panel     Status: None (Preliminary result)   Collection Time: 03/20/18  3:53 PM  Result Value Ref Range Status   Specimen Description BLOOD RIGHT ARM  Final   Special Requests   Final    BOTTLES DRAWN AEROBIC AND ANAEROBIC Blood Culture adequate volume   Culture   Final    NO GROWTH 3 DAYS Performed at [facility], [address], [place], [place] [address]    Report Status PENDING  Incomplete  Culture, blood (Routine X 2) w Reflex to ID Panel     Status: None (Preliminary result)   Collection Time: 03/20/18  4:00 PM  Result Value Ref Range Status   Specimen Description BLOOD LEFT ANTECUBITAL  Final   Special Requests   Final    BOTTLES DRAWN AEROBIC AND ANAEROBIC Blood Culture adequate volume   Culture   Final    NO GROWTH 3 DAYS Performed at [facility], [place] [address] [place], [place] [address]    Report Status PENDING  Incomplete      Radiology Studies: No results found.   Scheduled Meds: . docusate sodium  100 mg Oral BID  . finasteride  5 mg Oral q morning - 10a  . heparin  5,000 Units Subcutaneous Q8H  . insulin aspart  0-9 Units Subcutaneous TID WC  . levothyroxine  88 mcg Oral QAC breakfast  . pravastatin  80 mg Oral QPM  . predniSONE  5 mg Oral q morning - 10a  . protein supplement shake  11 oz Oral BID BM  . vitamin B-12  500 mcg Oral Daily   Continuous Infusions: . sodium chloride 10 mL/hr at 03/23/18 1203  . lactated ringers 10 mL/hr at 03/23/18 0946  . methocarbamol (ROBAXIN) IV    . piperacillin-tazobactam (ZOSYN)  IV 12.5 mL/hr at 03/23/18 0438  . vancomycin 1,250 mg (03/22/18 1653)     LOS: 3 days   Time Spent in minutes   30 minutes  Soren Pigman  D.O.  on 03/23/2018 at 1:51 PM  Between 7am to 7pm - Pager - [phone]  After 7pm go to [email or web address] - password [email or web address]  And look for the night coverage person covering for me after hours  [facility] Office  [phone]

## 2018-03-23 NOTE — Anesthesia Procedure Notes (Signed)
Date/Time: 03/23/2018 10:33 AM Performed by: Colin Benton, CRNA Oxygen Delivery Method: Simple face mask

## 2018-03-23 NOTE — Care Management Important Message (Signed)
Important Message  Patient Details  Name: [name] [name]. MRN: [id number] Date of Birth: Oct 23, 1922   Medicare Important Message Given:  Yes    Rolinda Impson 03/23/2018, 3:06 PM

## 2018-03-23 NOTE — Progress Notes (Signed)
Orthopedic Tech Progress Note Patient Details:  [name] [name]. [birth year] [id number]  Ortho Devices Type of Ortho Device: Postop shoe/boot Ortho Device/Splint Location: rle Ortho Device/Splint Interventions: Application   Post Interventions Patient Tolerated: Well Instructions Provided: Care of device   Hildred Priest 03/23/2018, 11:23 AM

## 2018-03-23 NOTE — Anesthesia Postprocedure Evaluation (Signed)
Anesthesia Post Note  Patient: [name] [name].  Procedure(s) Performed: RIGHT 2ND TOE AMPUTATION (Right Foot)     Patient location during evaluation: PACU Anesthesia Type: MAC Level of consciousness: awake and alert Pain management: pain level controlled Vital Signs Assessment: post-procedure vital signs reviewed and stable Respiratory status: spontaneous breathing, nonlabored ventilation and respiratory function stable Cardiovascular status: blood pressure returned to baseline and stable Postop Assessment: no apparent nausea or vomiting Anesthetic complications: no    Last Vitals:  Vitals:   03/23/18 1115 03/23/18 1130  BP: (!) 147/61 (!) 154/68  Pulse: 69 64  Resp: 17 10  Temp:  36.6 C  SpO2: 94% 96%    Last Pain:  Vitals:   03/23/18 1115  TempSrc:   PainSc: 0-No pain                 Lynda Rainwater

## 2018-03-24 ENCOUNTER — Encounter (HOSPITAL_COMMUNITY): Payer: Self-pay | Admitting: Orthopedic Surgery

## 2018-03-24 ENCOUNTER — Other Ambulatory Visit (HOSPITAL_COMMUNITY): Payer: Self-pay

## 2018-03-24 LAB — GLUCOSE, CAPILLARY
GLUCOSE-CAPILLARY: 80 mg/dL (ref 70–99)
GLUCOSE-CAPILLARY: 91 mg/dL (ref 70–99)

## 2018-03-24 LAB — BASIC METABOLIC PANEL
Anion gap: 8 (ref 5–15)
BUN: 15 mg/dL (ref 8–23)
CHLORIDE: 99 mmol/L (ref 98–111)
CO2: 29 mmol/L (ref 22–32)
CREATININE: 1.54 mg/dL — AB (ref 0.61–1.24)
Calcium: 8.8 mg/dL — ABNORMAL LOW (ref 8.9–10.3)
GFR calc Af Amer: 43 mL/min — ABNORMAL LOW (ref >60)
GFR, EST NON AFRICAN AMERICAN: 37 mL/min — AB (ref >60)
Glucose, Bld: 92 mg/dL (ref 70–99)
POTASSIUM: 3.7 mmol/L (ref 3.5–5.1)
SODIUM: 136 mmol/L (ref 135–145)

## 2018-03-24 LAB — CBC
HCT: 36.2 % — ABNORMAL LOW (ref 39.0–52.0)
HEMOGLOBIN: 11.7 g/dL — AB (ref 13.0–17.0)
MCH: 31.9 pg (ref 26.0–34.0)
MCHC: 32.3 g/dL (ref 30.0–36.0)
MCV: 98.6 fL (ref 78.0–100.0)
PLATELETS: 277 10*3/uL (ref 150–400)
RBC: 3.67 MIL/uL — AB (ref 4.22–5.81)
RDW: 14.1 % (ref 11.5–15.5)
WBC: 15 10*3/uL — ABNORMAL HIGH (ref 4.0–10.5)

## 2018-03-24 MED ORDER — DOXYCYCLINE HYCLATE 100 MG PO CAPS: 100.0000 mg | ORAL_CAPSULE | Freq: Two times a day (BID) | ORAL | Status: AC

## 2018-03-24 NOTE — Clinical Social Work Note (Signed)
Pt will need PT eval and note in order to submit to [facility]. MD updated.  Loletha Grayer, MSW [phone]

## 2018-03-24 NOTE — NC FL2 (Signed)
[place] LEVEL OF CARE SCREENING TOOL     IDENTIFICATION  Patient Name: [name] [name]. Birthdate: 06-24-1923 Sex: male Admission Date (Current Location): 03/20/2018  [facility] and [place] Number:  Herbalist and Address:  [facility], [place] [address] [place], [place] [address]      Provider Number:    Attending Physician Name and Address:  Cristal Ford, DO  Relative Name and Phone Number:       Current Level of Care: Hospital Recommended Level of Care: [place] Prior Approval Number:    Date Approved/Denied:   PASRR Number: [id number]  Discharge Plan: SNF    Current Diagnoses: Patient Active Problem List   Diagnosis Date Noted  . Toe osteomyelitis, right ([place]) 03/20/2018  . 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 Osteomyelitis of great toe of right foot ([place])   . Idiopathic chronic venous hypertension of both lower extremities with inflammation 12/13/2017  . Osteomyelitis ([place]) 11/29/2017  . Thrush 11/29/2017  . Plantar ulcer of right foot ([place])   . Ulcer of right foot with fat layer exposed ([place]) 11/18/2017  . Cellulitis of right foot 11/18/2017  . Cellulitis of right ankle 11/18/2017  . Adrenal insufficiency ([place]) 07/01/2017  . Bulging of intervertebral disc between L4 and L5 07/01/2017  . At high risk for bleeding 07/01/2017  . Right buttock pain 02/08/2017  . Leukocytosis 10/12/2016  . HLD (hyperlipidemia) 04/20/2016  . BPH (benign prostatic hyperplasia) 12/08/2015  . CKD (chronic kidney disease) stage 3, GFR 30-59 ml/min (HCC) 11/27/2015  . S/P total knee arthroplasty 07/13/2015  . OA (osteoarthritis) of knee 06/29/2015  . Ectropion 05/06/2015  . Lateral meniscal tear 02/05/2015  . Hyponatremia 10/20/2013  . Weakness 10/19/2013  . Hypertension   . 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 Hypothyroidism (acquired)   . Anemia, B12  deficiency   . Meibomian gland disease 08/06/2013  . Primary open angle glaucoma 06/19/2013  . Arthritis of shoulder region, right 12/15/2011  . Exposure keratitis 08/17/2011  . Dry eye 07/13/2011    Orientation RESPIRATION BLADDER Height & Weight     Self, Time, Situation, Place  Normal Continent Weight: 180 lb (81.6 kg) Height:  5' 10.98" (180.3 cm)  BEHAVIORAL SYMPTOMS/MOOD NEUROLOGICAL BOWEL NUTRITION STATUS      Continent Diet(Regular diet thin liquids)  AMBULATORY STATUS COMMUNICATION OF NEEDS Skin   Limited Assist Verbally Surgical wounds(Right foot, guaze dressing)                       Personal Care Assistance Level of Assistance  Dressing, Bathing, Feeding Bathing Assistance: Limited assistance Feeding assistance: Independent Dressing Assistance: Limited assistance     Functional Limitations Info  Sight, Hearing, Speech Sight Info: Adequate Hearing Info: Adequate Speech Info: Adequate    SPECIAL CARE FACTORS FREQUENCY  PT (By licensed PT), OT (By licensed OT)                    Contractures Contractures Info: Not present    Additional Factors Info  Code Status, Allergies Code Status Info: Full Code Allergies Info: Adhesive Tape, Percocet Oxycodone-acetaminophen, Robaxin Methocarbamol, Diovan Valsartan           Current Medications (03/24/2018):  This is the current hospital active medication list Current Facility-Administered Medications  Medication Dose Route Frequency Provider Last Rate Last Dose  . 0.9 %  sodium chloride infusion   Intravenous Continuous [name], MD 10 mL/hr at 03/23/18 1203    . acetaminophen (TYLENOL) tablet 650 mg  650 mg Oral Q6H PRN [name], MD   650 mg at 03/24/18 0554   Or  . acetaminophen (TYLENOL) suppository 650 mg  650 mg Rectal Q6H PRN [name], MD      . alum & mag hydroxide-simeth (MAALOX/MYLANTA) 200-200-20 MG/5ML suspension 15 mL  15 mL Oral Q4H PRN Cristal Ford, DO   15 mL at  03/23/18 1808  . bisacodyl (DULCOLAX) suppository 10 mg  10 mg Rectal Daily PRN [name], MD      . docusate sodium (COLACE) capsule 100 mg  100 mg Oral BID [name], MD   100 mg at 03/23/18 2218  . finasteride (PROSCAR) tablet 5 mg  5 mg Oral q morning - 10a [name], MD   5 mg at 03/22/18 0945  . heparin injection 5,000 Units  5,000 Units Subcutaneous Q8H [name], MD   5,000 Units at 03/24/18 0554  . HYDROcodone-acetaminophen (NORCO) 7.5-325 MG per tablet 1-2 tablet  1-2 tablet Oral Q4H PRN [name], MD      . HYDROcodone-acetaminophen (NORCO/VICODIN) 5-325 MG per tablet 1-2 tablet  1-2 tablet Oral Q4H PRN [name], MD      . insulin aspart (novoLOG) injection 0-9 Units  0-9 Units Subcutaneous TID WC [name], MD   1 Units at 03/23/18 1736  . lactated ringers infusion   Intravenous Continuous [name], MD 10 mL/hr at 03/23/18 [phone]    . levothyroxine (SYNTHROID, LEVOTHROID) tablet 88 mcg  88 mcg Oral QAC breakfast [name], MD   88 mcg at 03/22/18 [phone]  . magnesium citrate solution 1 Bottle  1 Bottle Oral Once PRN [name], MD      . methocarbamol (ROBAXIN) tablet 500 mg  500 mg Oral Q6H PRN [name], MD   500 mg at 03/24/18 0554   Or  . methocarbamol (ROBAXIN) 500 mg in dextrose 5 % 50 mL IVPB  500 mg Intravenous Q6H PRN [name], MD      . metoCLOPramide (REGLAN) tablet 5-10 mg  5-10 mg Oral Q8H PRN [name], MD       Or  . metoCLOPramide (REGLAN) injection 5-10 mg  5-10 mg Intravenous Q8H PRN [name], MD      . morphine 2 MG/ML injection 0.5-1 mg  0.5-1 mg Intravenous Q2H PRN [name], MD      . ondansetron [facility]) tablet 4 mg  4 mg Oral Q6H PRN [name], MD       Or  . ondansetron [facility]) injection 4 mg  4 mg Intravenous Q6H PRN [name], MD   4 mg at 03/23/18 1807  . pantoprazole (PROTONIX) EC tablet 40 mg  40 mg Oral Daily Mikhail, [place], DO      . piperacillin-tazobactam (ZOSYN)  IVPB 3.375 g  3.375 g Intravenous Q8H [name], MD 12.5 mL/hr at 03/24/18 0108 3.375 g at 03/24/18 0108  . polyethylene glycol (MIRALAX / GLYCOLAX) packet 17 g  17 g Oral Daily PRN [name], MD      . pravastatin (PRAVACHOL) tablet 80 mg  80 mg Oral QPM [name], MD   80 mg at 03/23/18 1736  . predniSONE (DELTASONE) tablet 5 mg  5 mg  Oral q morning - 10a [name], MD   5 mg at 03/22/18 0945  . protein supplement (PREMIER PROTEIN) liquid - approved for s/p bariatric surgery  11 oz Oral BID BM [name], MD      . vancomycin (VANCOCIN) 1,250 mg in sodium chloride 0.9 % 250 mL IVPB  1,250 mg Intravenous Q24H [name], MD 166.7 mL/hr at 03/23/18 1745 1,250 mg at 03/23/18 1745  . vitamin B-12 (CYANOCOBALAMIN) tablet 500 mcg  500 mcg Oral Daily [name], MD   500 mcg at 03/22/18 1364     Discharge Medications: Please see discharge summary for a list of discharge medications.  Relevant Imaging Results:  Relevant Lab Results:   Additional Information SS#: [id number]  Eileen Stanford, LCSW

## 2018-03-24 NOTE — Discharge Instructions (Signed)
Bone and Joint Infections, Adult Bone infections (osteomyelitis) and joint infections (septic arthritis) occur when bacteria or other germs get inside a bone or a joint. This can happen if you have an infection in another part of your body that spreads through your blood. Germs from your skin or from outside of your body can also cause this type of infection if you have a wound or a broken bone (fracture) that breaks the skin. Anyone can get a bone infection or joint infection. You may be more likely to get this type of infection if you have a condition, such as diabetes, that lowers your ability to fight infection or increases your chances of getting an infection. Bone and joint infections can cause damage, and they can spread to other areas of your body. They need to be treated quickly. What are the causes? Most bone and joint infections are caused by bacteria. They can also be caused by other germs, such as viruses and funguses. What increases the risk? This condition is more likely to develop in:  People who recently had surgery, especially bone or joint surgery.  People who have a long-term (chronic) disease, such as: ? HIV (human immunodeficiency virus). ? Diabetes. ? Rheumatoid arthritis. ? Sickle cell anemia.  Elderly people.  People who take medicines that block or weaken the body's defense system (immune system).  People who have a condition that reduces their blood flow.  People who are on kidney dialysis.  People who have an artificial joint.  People who have had a joint or bone repaired with plates or screws (surgical hardware).  People who use or abuse IV drugs.  People who have had trauma, such as stepping on a nail.  What are the signs or symptoms? Symptoms vary depending on the type and location of your infection. Common symptoms of bone and joint infections include:  Fever and chills.  Redness and warmth.  Swelling.  Pain and stiffness.  Drainage of fluid  or pus near the infection.  Weight loss and fatigue.  Decreased ability to use a hand or foot.  How is this diagnosed? This condition may be diagnosed based on symptoms, medical history, a physical exam, and diagnostic tests. Tests can help to identify the cause of the infection. You may have various tests, such as:  A sample of tissue, fluid, or blood taken to be examined under a microscope.  A procedure to remove fluid from the infected joint with a needle (joint aspiration) for testing in a lab.  Pus or discharge swabbed from a wound for testing to identify germs and to determine what type of medicine will kill them (culture and sensitivity).  Blood tests to look for evidence of infection and inflammation (biomarkers).  Imaging studies to determine how severe the bone or joint infection is. These may include: ? X-rays. ? CT scan. ? MRI. ? Bone scan.  How is this treated? Treatment depends on the cause and type of infection. Antibiotic medicines are usually the first treatment for a bone or joint infection. Treatment with antibiotics may include:  Getting IV antibiotics. This may be done in a hospital at first. You may have to continue IV antibiotics at home for several weeks. You may also have to take antibiotics by mouth for several weeks after that.  Taking more than one kind of antibiotic. Treatment may start with a type of antibiotic that works against many different bacteria (broad spectrumantibiotics). IV antibiotics may be changed if tests show that another   type may work better.  Other treatments may include:  Draining fluid from the joint by placing a needle into it (aspiration).  Surgery to remove: ? Dead or dying tissue from a bone or joint. ? An infected artificial joint. ? Infected plates or screws that were used to repair a broken bone.  Follow these instructions at home:  Take medicines only as directed by your health care provider.  Take your antibiotic  medicine as directed by your health care provider. Finish the antibiotic even if you start to feel better.  Follow instructions from your health care provider about how to take IV antibiotics at home.  Ask your health care provider if you have any restrictions on your activities.  Keep all follow-up visits as directed by your health care provider. This is important. Contact a health care provider if:  You have a fever or chills.  You have redness, warmth, pain, or swelling that returns after treatment. Get help right away if:  You have rapid breathing or you have trouble breathing.  You have chest pain.  You cannot drink fluids or make urine.  The affected arm or leg swells, changes color, or turns blue. This information is not intended to replace advice given to you by your health care provider. Make sure you discuss any questions you have with your health care provider. Document Released: 08/15/2005 Document Revised: 01/21/2016 Document Reviewed: 08/13/2014 Elsevier Interactive Patient Education  2018 Elsevier Inc.  

## 2018-03-24 NOTE — Discharge Summary (Signed)
Physician Discharge Summary  [name] NLZ:[id number] DOB: July 15, 1923 DOA: 03/20/2018  PCP: [name], DO  Admit date: 03/20/2018 Discharge date: 03/24/2018  Time spent: 45 minutes  Recommendations for Outpatient Follow-up:  Patient will be discharged to [facility] skilled nursing facility; continue physical and occupational thearpy.  Patient will need to follow up with primary care provider within one week of discharge.  Follow up with Dr. Sharol Given. Patient should continue medications as prescribed.  Patient should follow a regular diet.   Discharge Diagnoses:  Osteomyelitis of the right second toe along with cellulitis Possible Adrenal insufficiency Hypothyroidism Dyslipidemia Chronic kidney disease, stage III  Discharge Condition: Stable  Diet recommendation: Regular  Filed Weights   03/20/18 1450 03/23/18 0946  Weight: 81.6 kg (180 lb) 81.6 kg (180 lb)    History of present illness:  On 03/20/2018 by Dr. 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 Sharol Given with complaints of right lower extremity swelling, erythema, chills, low-grade fever, myalgias since Thursday. Patient noticed drainage and redness from his second right toe,and redness moving up his foot into his right lower extremity. He also noticed worsening dependent edema and swelling. He started experiencing low-grade fever of around 100 degrees Fahrenheit which he has been trying to keep under control with prn Tylenol.patient also complains of chills, rigors, during this interview. He also complains of being queasy and nauseous. He denies any chest pain or shortness of breath.  Hospital Course:  Osteomyelitis of the right second toe along with cellulitis -Patient placed on vancomycin and Zosyn -Orthopedics, Dr. Sharol Given, consulted and appreciated, s/p right 2nd toe  amputation -Continue IV fluids -Right Lower extremity Doppler negative  -Echocardiogram EF 90-24%, grade 1 diastolic dysfunction -Blood cultures show no growth to date -PT and OT consulted for evaluation pending, however patient and family would like to return to [facility] as soon as possible. -will discharge patient with doxycycline  -Leave dressing in place, use ortho shoe (per ortho)  Possible Adrenal insufficiency -Patient takes chronic prednisone as well as testosterone injections which have been held -Continue prednisone, currently blood pressure within normal limits and stable -Cortisol level within normal limits  Hypothyroidism -TSH <0.10 -FT4 1.08 -Continue Synthroid; patient will need to follow up with PCP as an outpatient   Dyslipidemia -Continue statin  Chronic kidney disease, stage III -Creatinine appears to be at baseline, currently 1.54 -Continue to monitor BMP  Consultants Orthopedics, Dr.Duda  Procedures  right 2nd toe amputation  Discharge Exam: Vitals:   03/24/18 0043 03/24/18 0529  BP: 135/63 (!) 153/63  Pulse: 75 75  Resp: 16 14  Temp: 99.4 F (37.4 C) 99.3 F (37.4 C)  SpO2: 94% 95%     General: Well developed, well nourished, NAD, appears stated age  HEENT: NCAT, mucous membranes moist.  Neck: Supple  Cardiovascular: S1 S2 auscultated, no rubs, murmurs or gallops. Regular rate and rhythm.  Respiratory: Clear to auscultation bilaterall  Abdomen: Soft, nontender, nondistended, + bowel sounds  Extremities: warm dry without cyanosis clubbing or edema of LLE. RLE edema with mild erythema-improved. Dressing in place  Neuro: AAOx3, nonfocal  Psych: Normal affect and demeanor with intact judgement and insight  Discharge Instructions Discharge Instructions    Discharge instructions   Complete by:  As directed    Patient will be discharged to [facility] skilled nursing facility; continue physical and occupational thearpy.  Patient  will need to follow up with primary care provider within one week of discharge.  Follow up with Dr. Sharol Given. Patient should continue medications as prescribed.  Patient should follow a regular diet   Elevate operative extremity   Complete by:  As directed    Touch down weight bearing   Complete by:  As directed    Laterality:  right   Extremity:  Lower     Allergies as of 03/24/2018      Reactions   Adhesive [tape] Other (See Comments)   CAUSES SKIN TEARS, prefers paper tape    Percocet [oxycodone-acetaminophen] Other (See Comments)   UNSPECIFIED REACTION  Does not want to take   Robaxin [methocarbamol]    UNSPECIFIED REACTION    Diovan [valsartan] Rash      Medication List    TAKE these medications   acetaminophen 500 MG tablet Commonly known as:  TYLENOL Take 500 mg by mouth daily as needed for fever.   Coenzyme Q10 300 MG Caps Take 300 mg by mouth daily.   diclofenac sodium 1 % Gel Commonly known as:  VOLTAREN Apply 2 g topically 4 (four) times daily.   docusate sodium 100 MG capsule Commonly known as:  COLACE Take 200 mg by mouth at bedtime.   doxycycline 100 MG capsule Commonly known as:  VIBRAMYCIN Take 1 capsule (100 mg total) by mouth 2 (two) times daily for 7 days.   FIBER 7 Powd Take 2 Doses by mouth at bedtime.   finasteride 5 MG tablet Commonly known as:  PROSCAR Take 5 mg by mouth daily.   MULTIVITAMIN & MINERAL PO Take 1 tablet by mouth daily.   OMEGA-3 FISH OIL PO Take 700 mg by mouth daily.   pravastatin 80 MG tablet Commonly known as:  PRAVACHOL Take 80 mg by mouth every other day.   predniSONE 5 MG tablet Commonly known as:  DELTASONE Take 2.5-5 mg by mouth See admin instructions. 5mg  in the morning and 2.5mg  in the evening   SYNTHROID 88 MCG tablet Generic drug:  levothyroxine Take 88 mcg by mouth daily before breakfast.   SYSTANE OP Place 1 drop into both eyes daily as needed (dry eyes).   testosterone cypionate 200 MG/ML  injection Commonly known as:  DEPOTESTOSTERONE CYPIONATE Inject 30 mg (0.15 ml) intramuscularly weekly. Must discard vial after 90 days.   vitamin B-12 500 MCG tablet Commonly known as:  CYANOCOBALAMIN Take 500 mcg by mouth daily.   Vitamin D 2000 units tablet Take 2,000 Units by mouth daily.            Discharge Care Instructions  (From admission, onward)        Start     Ordered   03/23/18 0000  Touch down weight bearing    Question Answer Comment  Laterality right   Extremity Lower      03/23/18 1035     Allergies  Allergen Reactions  . Adhesive [Tape] Other (See Comments)    CAUSES SKIN TEARS, prefers paper tape   . Percocet [Oxycodone-Acetaminophen] Other (See Comments)    UNSPECIFIED REACTION  Does not want to take  . Robaxin [Methocarbamol]     UNSPECIFIED REACTION   . Diovan [Valsartan] Rash   Follow-up Information    [name], MD In 1 week.   Specialty:  Orthopedic Surgery Contact information: [place] [address] [phone]        [name], DO. Schedule an appointment  as soon as possible for a visit in 1 week(s).   Specialty:  Geriatric Medicine Why:  Hospital follow up, discuss thyroid tests Contact information: [place] [address] [phone]            The results of significant diagnostics from this hospitalization (including imaging, microbiology, ancillary and laboratory) are listed below for reference.    Significant Diagnostic Studies: Xr Foot Complete Right  Result Date: 03/20/2018 3 view radiographs of the right foot shows destructive bony changes of the second toe.   Microbiology: Recent Results (from the past 240 hour(s))  Urine Culture     Status: None   Collection Time: 03/20/18 12:42 PM  Result Value Ref Range Status   Specimen Description URINE, RANDOM  Final   Special Requests NONE  Final   Culture   Final    NO GROWTH Performed at [facility], [address], [place], [place] [address]    Report Status 03/22/2018 FINAL  Final  MRSA PCR Screening     Status: None   Collection Time: 03/20/18  2:54 PM  Result Value Ref Range Status   MRSA by PCR NEGATIVE NEGATIVE Final    Comment:        The GeneXpert MRSA Assay (FDA approved for NASAL specimens only), is one component of a comprehensive MRSA colonization surveillance program. It is not intended to diagnose MRSA infection nor to guide or monitor treatment for MRSA infections. Performed at [facility], [place] [address] [place], [place] [address]   Culture, blood (Routine X 2) w Reflex to ID Panel     Status: None (Preliminary result)   Collection Time: 03/20/18  3:53 PM  Result Value Ref Range Status   Specimen Description BLOOD RIGHT ARM  Final   Special Requests   Final    BOTTLES DRAWN AEROBIC AND ANAEROBIC Blood Culture adequate volume   Culture   Final    NO GROWTH 4 DAYS Performed at [facility], [address], [place], [place] [address]    Report Status PENDING  Incomplete  Culture, blood (Routine X 2) w Reflex to ID Panel     Status: None (Preliminary result)   Collection Time: 03/20/18  4:00 PM  Result Value Ref Range Status   Specimen Description BLOOD LEFT ANTECUBITAL  Final   Special Requests   Final    BOTTLES DRAWN AEROBIC AND ANAEROBIC Blood Culture adequate volume   Culture   Final    NO GROWTH 4 DAYS Performed at [facility], [place] [address] [place], [place] [address]    Report Status PENDING  Incomplete     Labs: Basic Metabolic Panel: Recent Labs  Lab 03/20/18 1343 03/21/18 0512 03/22/18 0339 03/23/18 0400 03/24/18 0709  NA 135 137 137 139 136  K 4.1 3.8 4.2 3.9 3.7  CL 100 102 101 104 99  CO2 26 29 28 27 29   GLUCOSE 85 83 111* 108* 92  BUN 16 12 13 17 15   CREATININE 1.28* 1.38* 1.55* 1.46* 1.54*  CALCIUM 9.4 8.8* 9.2 9.0 8.8*  MG 2.0  --   --   --   --    Liver Function Tests: Recent Labs  Lab  03/20/18 1343 03/21/18 0512  AST 17 15  ALT 15 14  ALKPHOS 49 43  BILITOT 0.8 1.0  PROT 6.5 5.3*  ALBUMIN 3.2* 2.4*   No results for input(s): LIPASE, AMYLASE in the last 168  hours. No results for input(s): AMMONIA in the last 168 hours. CBC: Recent Labs  Lab 03/20/18 1343 03/21/18 0512 03/22/18 0339 03/23/18 0400 03/24/18 0709  WBC 14.0* 10.4 12.3* 11.6* 15.0*  HGB 12.8* 11.2* 12.1* 11.5* 11.7*  HCT 39.3 34.5* 37.0* 35.9* 36.2*  MCV 98.7 98.3 98.7 98.6 98.6  PLT 254 238 267 286 277   Cardiac Enzymes: Recent Labs  Lab 03/20/18 1343 03/20/18 1943  TROPONINI <0.03 <0.03   BNP: BNP (last 3 results) No results for input(s): BNP in the last 8760 hours.  ProBNP (last 3 results) No results for input(s): PROBNP in the last 8760 hours.  CBG: Recent Labs  Lab 03/23/18 1208 03/23/18 1635 03/23/18 2218 03/24/18 0631 03/24/18 1149  GLUCAP 84 132* 104* 80 91       Signed:  Jinan Biggins  [facility] 03/24/2018, 12:05 PM

## 2018-03-24 NOTE — Progress Notes (Signed)
   Subjective: 1 Day Post-Op Procedure(s) (LRB): RIGHT 2ND TOE AMPUTATION (Right) Patient reports pain as mild.    Objective: Vital signs in last 24 hours: Temp:  [97.2 F (36.2 C)-99.4 F (37.4 C)] 99.3 F (37.4 C) (07/27 0529) Pulse Rate:  [64-78] 75 (07/27 0529) Resp:  [10-18] 14 (07/27 0529) BP: (127-155)/(61-77) 153/63 (07/27 0529) SpO2:  [94 %-100 %] 95 % (07/27 0529) Weight:  [180 lb (81.6 kg)] 180 lb (81.6 kg) (07/26 0946)  Intake/Output from previous day: 07/26 0701 - 07/27 0700 In: 3011.9 [P.O.:100; I.V.:373.8; IV Piggyback:2538.1] Out: 360 [Urine:350; Blood:10] Intake/Output this shift: No intake/output data recorded.  Recent Labs    03/22/18 0339 03/23/18 0400 03/24/18 0709  HGB 12.1* 11.5* 11.7*   Recent Labs    03/23/18 0400 03/24/18 0709  WBC 11.6* 15.0*  RBC 3.64* 3.67*  HCT 35.9* 36.2*  PLT 286 277   Recent Labs    03/23/18 0400 03/24/18 0709  NA 139 136  K 3.9 3.7  CL 104 99  CO2 27 29  BUN 17 15  CREATININE 1.46* 1.54*  GLUCOSE 108* 92  CALCIUM 9.0 8.8*   No results for input(s): LABPT, INR in the last 72 hours.  slight old blood on dressing.  No results found.  Assessment/Plan: 1 Day Post-Op Procedure(s) (LRB): RIGHT 2ND TOE AMPUTATION (Right) Plan:  Leave dressing on . Has Ortho shoe.   Marybelle Killings 03/24/2018, 8:44 AM

## 2018-03-24 NOTE — Clinical Social Work Placement (Signed)
   CLINICAL SOCIAL WORK PLACEMENT  NOTE  Date:  03/24/2018  Patient Details  Name: [name] [name]. MRN: [id number] Date of Birth: 25-Aug-1923  Clinical Social Work is seeking post-discharge placement for this patient at the [place] level of care (*CSW will initial, date and re-position this form in  chart as items are completed):      Patient/family provided with [place] Work Department's list of facilities offering this level of care within the geographic area requested by the patient (or if unable, by the patient's family).  Yes   Patient/family informed of their freedom to choose among providers that offer the needed level of care, that participate in Medicare, Medicaid or managed care program needed by the patient, have an available bed and are willing to accept the patient.      Patient/family informed of [facility]'s ownership interest in [facility] and [facility], as well as of the fact that they are under no obligation to receive care at these facilities.  PASRR submitted to EDS on       PASRR number received on       Existing PASRR number confirmed on       FL2 transmitted to all facilities in geographic area requested by pt/family on       FL2 transmitted to all facilities within larger geographic area on       Patient informed that his/her managed care company has contracts with or will negotiate with certain facilities, including the following:        Yes   Patient/family informed of bed offers received.  Patient chooses bed at [facility]     Physician recommends and patient chooses bed at      Patient to be transferred to [facility] on 03/24/18.  Patient to be transferred to facility by [facility]     Patient family notified on 03/24/18 of transfer.  Name of family member notified:  [name], son     PHYSICIAN       Additional Comment:    _______________________________________________ Eileen Stanford,  LCSW 03/24/2018, 12:32 PM

## 2018-03-24 NOTE — Progress Notes (Signed)
[name] [name]. to be D/C'd Skilled nursing facility per MD order.  Discussed prescriptions and follow up appointments with the patient. Prescriptions given to patient, medication list explained in detail. Pt verbalized understanding.  Allergies as of 03/24/2018      Reactions   Adhesive [tape] Other (See Comments)   CAUSES SKIN TEARS, prefers paper tape    Percocet [oxycodone-acetaminophen] Other (See Comments)   UNSPECIFIED REACTION  Does not want to take   Robaxin [methocarbamol]    UNSPECIFIED REACTION    Diovan [valsartan] Rash      Medication List    TAKE these medications   acetaminophen 500 MG tablet Commonly known as:  TYLENOL Take 500 mg by mouth daily as needed for fever.   Coenzyme Q10 300 MG Caps Take 300 mg by mouth daily.   diclofenac sodium 1 % Gel Commonly known as:  VOLTAREN Apply 2 g topically 4 (four) times daily.   docusate sodium 100 MG capsule Commonly known as:  COLACE Take 200 mg by mouth at bedtime.   doxycycline 100 MG capsule Commonly known as:  VIBRAMYCIN Take 1 capsule (100 mg total) by mouth 2 (two) times daily for 7 days.   FIBER 7 Powd Take 2 Doses by mouth at bedtime.   finasteride 5 MG tablet Commonly known as:  PROSCAR Take 5 mg by mouth daily.   MULTIVITAMIN & MINERAL PO Take 1 tablet by mouth daily.   OMEGA-3 FISH OIL PO Take 700 mg by mouth daily.   pravastatin 80 MG tablet Commonly known as:  PRAVACHOL Take 80 mg by mouth every other day.   predniSONE 5 MG tablet Commonly known as:  DELTASONE Take 2.5-5 mg by mouth See admin instructions. 5mg  in the morning and 2.5mg  in the evening   SYNTHROID 88 MCG tablet Generic drug:  levothyroxine Take 88 mcg by mouth daily before breakfast.   SYSTANE OP Place 1 drop into both eyes daily as needed (dry eyes).   testosterone cypionate 200 MG/ML injection Commonly known as:  DEPOTESTOSTERONE CYPIONATE Inject 30 mg (0.15 ml) intramuscularly weekly. Must discard vial after  90 days.   vitamin B-12 500 MCG tablet Commonly known as:  CYANOCOBALAMIN Take 500 mcg by mouth daily.   Vitamin D 2000 units tablet Take 2,000 Units by mouth daily.            Discharge Care Instructions  (From admission, onward)        Start     Ordered   03/23/18 0000  Touch down weight bearing    Question Answer Comment  Laterality right   Extremity Lower      03/23/18 1035      Vitals:   03/24/18 0043 03/24/18 0529  BP: 135/63 (!) 153/63  Pulse: 75 75  Resp: 16 14  Temp: 99.4 F (37.4 C) 99.3 F (37.4 C)  SpO2: 94% 95%    Skin clean, dry and intact without evidence of skin break down, no evidence of skin tears noted. Right foot is wrapped in compression wrap (coban), with old drainage marked. Patient and family member were educated on wearing boot when transferring, verbally understood instructions. IV catheter discontinued intact. Site without signs and symptoms of complications. Dressing and pressure applied. Pt denies pain at this time. No complaints noted.  An After Visit Summary and prescription were printed and given to the patient. Patient escorted via W/C, and D/C to [facility] via [place] automobile.  [place] RN

## 2018-03-24 NOTE — Evaluation (Signed)
Physical Therapy Evaluation Patient Details Name: [name] [name]. MRN: [id number] DOB: June 19, 1923 Today's Date: 03/24/2018   History of Present Illness  Patient is S/P right 2nd toe amputation on 03/24/2018. He is from [facility]. He had his great toe removed in April and recovered well. PMH: osteomylitis, kidney stones, cancer, BPH, arthritis, anemia   Clinical Impression  Patient presents with decreased ability to transfer and ambulate compared to baseline. He requires mod cuing for weight bearing status. At this time he would benefit most from rehab at a SNF. PT will continue to follow acutely.     Follow Up Recommendations SNF    Equipment Recommendations  Rolling walker with 5" wheels    Recommendations for Other Services Rehab consult     Precautions / Restrictions Precautions Precautions: Fall Restrictions Weight Bearing Restrictions: Yes RLE Weight Bearing: Touchdown weight bearing      Mobility  Bed Mobility Overal bed mobility: Needs Assistance Bed Mobility: Supine to Sit           General bed mobility comments: required min a to sit up at the edge of the bed   Transfers Overall transfer level: Needs assistance Equipment used: Rolling walker (2 wheeled) Transfers: Sit to/from Stand Sit to Stand: Min assist         General transfer comment: Min a for inital standing strength and mod cuing for weight bearing status   Ambulation/Gait Ambulation/Gait assistance: Min guard Gait Distance (Feet): 12 Feet Assistive device: Rolling walker (2 wheeled) Gait Pattern/deviations: Decreased weight shift to right;Decreased stance time - left;Decreased step length - right     General Gait Details: Moderate cuing to remain TDWB on the right   Stairs            Wheelchair Mobility    Modified Rankin (Stroke Patients Only)       Balance Overall balance assessment: Needs assistance Sitting-balance support: No upper extremity  supported;Feet supported Sitting balance-Leahy Scale: Good     Standing balance support: Bilateral upper extremity supported Standing balance-Leahy Scale: Poor                               Pertinent Vitals/Pain Pain Assessment: No/denies pain    Home Living Family/patient expects to be discharged to:: Skilled nursing facility Living Arrangements: Spouse/significant other;Other (Comment) Available Help at Discharge: Family             Additional Comments: Patient lived at [facility] in the independent living section     Prior Function Level of Independence: Independent         Comments: Had recovered fully from amputation in April per patients wife      Hand Dominance   Dominant Hand: Right    Extremity/Trunk Assessment   Upper Extremity Assessment Upper Extremity Assessment: Overall WFL for tasks assessed    Lower Extremity Assessment Lower Extremity Assessment: RLE deficits/detail RLE: Unable to fully assess due to pain;Unable to fully assess due to immobilization    Cervical / Trunk Assessment Cervical / Trunk Assessment: Normal  Communication   Communication: No difficulties  Cognition Arousal/Alertness: Awake/alert Behavior During Therapy: WFL for tasks assessed/performed Overall Cognitive Status: Within Functional Limits for tasks assessed                                        General Comments  General comments (skin integrity, edema, etc.): right foot in bandage. Has bilateral surgical shoes in his room     Exercises     Assessment/Plan    PT Assessment Patient needs continued PT services  PT Problem List Decreased strength;Decreased range of motion;Decreased activity tolerance;Decreased mobility;Decreased knowledge of use of DME;Decreased safety awareness       PT Treatment Interventions DME instruction;Gait training;Stair training;Functional mobility training;Therapeutic activities;Therapeutic  exercise;Neuromuscular re-education    PT Goals (Current goals can be found in the Care Plan section)  Acute Rehab PT Goals Patient Stated Goal: to go back to [facility] and begin rehab  PT Goal Formulation: With patient Time For Goal Achievement: 03/31/18 Potential to Achieve Goals: Good    Frequency Min 3X/week   Barriers to discharge        Co-evaluation               AM-PAC PT "6 Clicks" Daily Activity  Outcome Measure Difficulty turning over in bed (including adjusting bedclothes, sheets and blankets)?: A Lot Difficulty moving from lying on back to sitting on the side of the bed? : A Lot Difficulty sitting down on and standing up from a chair with arms (e.g., wheelchair, bedside commode, etc,.)?: A Lot Help needed moving to and from a bed to chair (including a wheelchair)?: A Lot Help needed walking in hospital room?: Total Help needed climbing 3-5 steps with a railing? : Total 6 Click Score: 10    End of Session Equipment Utilized During Treatment: Gait belt Activity Tolerance: Patient tolerated treatment well;No increased pain Patient left: in chair;with call bell/phone within reach;with family/visitor present Nurse Communication: Mobility status PT Visit Diagnosis: Unsteadiness on feet (R26.81);Difficulty in walking, not elsewhere classified (R26.2)    Time: 1130-1147 PT Time Calculation (min) (ACUTE ONLY): 17 min   Charges:   PT Evaluation $PT Eval Low Complexity: 1 Low         Carney Living PT DPT  03/24/2018, 12:07 PM

## 2018-03-24 NOTE — Progress Notes (Signed)
Family member was highly upset with care today. Stated that he needed to go to [facility] today or patient will die. Family member stated that room and transportation for patient were already arranged, just needed discharge orders. MD notified of situation, stated that PT must evaluate patient before patient has discharge orders. PT consulted with patient, orders in. Awaiting for transportation.

## 2018-03-24 NOTE — Progress Notes (Signed)
Significant other has arranged transportation for patient to be [facility].

## 2018-03-24 NOTE — Clinical Social Work Note (Addendum)
Clinical Social Worker facilitated patient discharge including contacting patient family and facility to confirm patient discharge plans.  Clinical information faxed to facility and family agreeable with plan.  According to Pt's significant other states she has called "security" and they will bring up a wheelchair and take the pt down to the van and straight to [facility]. Pt's significant other also states that she has already talked to a Therapist, sports at [facility]. Supervisor of SNF at [facility] had not talked to pt's significant other today however did understand pt would be coming this weekend and did have a SNF bed for pt today. CSW has faxed d/c summary to [facility] and is on standby if pt needs [facility].  RN to call [phone] for report prior to discharge.  Clinical Social Worker will sign off for now as social work intervention is no longer needed. Please consult [place] again if new need arises.  Loletha Grayer, MSW [phone]

## 2018-03-25 LAB — CULTURE, BLOOD (ROUTINE X 2)
CULTURE: NO GROWTH
Culture: NO GROWTH
Special Requests: ADEQUATE
Special Requests: ADEQUATE

## 2018-03-26 ENCOUNTER — Telehealth: Payer: Self-pay

## 2018-03-26 NOTE — Telephone Encounter (Signed)
Possible re-admission to facility. This is a patient you were seeing at [facility] rehab . [facility] F/U is needed if patient was re-admitted to facility upon discharge. Hospital discharge from [facility] on 03/24/2018

## 2018-03-27 ENCOUNTER — Encounter: Payer: Self-pay | Admitting: Internal Medicine

## 2018-03-27 ENCOUNTER — Non-Acute Institutional Stay (SKILLED_NURSING_FACILITY): Payer: PPO | Admitting: Internal Medicine

## 2018-03-27 ENCOUNTER — Telehealth (INDEPENDENT_AMBULATORY_CARE_PROVIDER_SITE_OTHER): Payer: Self-pay | Admitting: Orthopedic Surgery

## 2018-03-27 DIAGNOSIS — N183 Chronic kidney disease, stage 3 unspecified: Secondary | ICD-10-CM

## 2018-03-27 DIAGNOSIS — E2740 Unspecified adrenocortical insufficiency: Secondary | ICD-10-CM | POA: Diagnosis not present

## 2018-03-27 DIAGNOSIS — Z89421 Acquired absence of other right toe(s): Secondary | ICD-10-CM

## 2018-03-27 DIAGNOSIS — M869 Osteomyelitis, unspecified: Secondary | ICD-10-CM

## 2018-03-27 DIAGNOSIS — M5442 Lumbago with sciatica, left side: Secondary | ICD-10-CM | POA: Diagnosis not present

## 2018-03-27 DIAGNOSIS — E893 Postprocedural hypopituitarism: Secondary | ICD-10-CM | POA: Diagnosis not present

## 2018-03-27 DIAGNOSIS — G8929 Other chronic pain: Secondary | ICD-10-CM | POA: Diagnosis not present

## 2018-03-27 NOTE — Telephone Encounter (Signed)
Patient called left voicemail message needing to speak with [name] concerning his next appointment. The number to contact patient is [phone]

## 2018-03-27 NOTE — Telephone Encounter (Signed)
Pt called and wanted to confirm his appt.

## 2018-03-27 NOTE — Progress Notes (Signed)
Patient ID: [name] Mcintire., male   DOB: June 04, 1923, 84 y.o.   MRN: [id number]  Provider:  Rexene Edison. Mariea Clonts, D.O., C.M.D. Location:  [place] Room Number: [place] of Service:  SNF (31)  PCP: [name], DO Patient Care Team: [name], DO as PCP - General (Geriatric Medicine) Community, [name], [name], MD as Referring Physician (Endocrinology)  Extended Emergency Contact Information Primary Emergency Contact: Goble,[name] Address: [address]          [place], [place] [address] [name] of [place] Phone: [phone] Mobile Phone: [phone] Relation: Son Secondary Emergency Contact: Barnetta Hammersmith , [place] Address: [address]          [place], [place] [address] [name] of [place] Phone: [phone] Relation: Son  Code Status: DNR Goals of Care: Advanced Directive information Advanced Directives 03/27/2018  Does Patient Have a Medical Advance Directive? Yes  Type of Paramedic of [place];Living will;Out of facility DNR (pink MOST or yellow form)  Does patient want to make changes to medical advance directive? No - Patient declined  Copy of [place] in Chart? Yes  Would patient like information on creating a medical advance directive? -  Pre-existing out of facility DNR order (yellow form or pink MOST form) Yellow form placed in chart (order not valid for inpatient use);Pink MOST form placed in chart (order not valid for inpatient use)   Chief Complaint  Patient presents with  . New Admit To SNF    Rehab admission    HPI: Patient is a 84 y.o. male with h/o osteomyelitis of his right great toe s/p resection, panhypopituitarism, adrenal insufficiency, htn, chronic venous insufficiency, L4-5 bulging disc with low back pain and radiculopathy, CKD3, seen today for admission to [facility] s/p another hospitalization with  cellulitis of his right foot and leg and osteomyelitis of his right second toe.  He was seen by Dr. Sharol Given 03/20/18 for right foot edema, pain, and fever to 100.4 and placed on IV antibiotics with vanc and zosyn with plans for amputation of his second toe.  xrays did confirm osteomyelitis in the second toe.  He underwent right second toe amputation on 03/23/18 by Dr. Sharol Given.  He was discharged here to rehab on 7/27 on doxycycline for 7 days.  Dressing on his wound is to be left in place and he's to use the orthopedic shoe provided.  He is touch down weight bearing.  He is to elevate his right leg.  When I saw [name] [name] today, he reported that he felt terrible while in the hospital.  It looks like his prednisone may have been held while he was there.  He feels better now back on his usual regimen.  He appears pale, but anemia was mild.  Will need recheck post discharge.  He sees Dr. Jess Barters assistant for f/u in one week from the surgery for the dressing to be removed and then sees Dr. Sharol Given at 3 wks for his suture removal.  He notes aching in the foot which he did not have with the first surgery to remove the great toe.    He is bothered by a fungal/yeast rash of his buttocks and groin that are driving him mad.  As usual, he's already thinking about when he can go home.  It sounds like he was eager at the hospital as well and got discharged a day or two earlier than they'd planned (  hoping he got sufficient IV antibiotics before he insisted upon leaving).  He reported losing a tremendous amount of weight which vitals do indicate a 14 lbs weight loss since 7/10.  He did not eat well at the hospital, but is back to eating his regular diet here.  Apparently, his CBGs were being checked due to hyperglycemia and he did not like that.    Past Medical History:  Diagnosis Date  . Allergic rhinitis    Prior allergy shots 20 years  . Anemia   . Anemia, B12 deficiency 2000  . Arthritis    Status post left total replacement 8  2011  . BPH (benign prostatic hyperplasia) 2013  . Cancer ([place])    renal cell ca and skin cancer   . Colitis 2014  . Difficult intubation 1994   surgery had to be  stopped due to injury to "throat"  . Difficult intubation 1994   no trouble since.  Required nasotracheal intubation  '02 [facility] / ANESTHESIA RECORD FROM 2013 AND 2016 IN EPIC  . GERD (gastroesophageal reflux disease)   . Glaucoma 2007   Status post left trabeculectomy 2007  . History of colon polyps    Colonoscopy 2001  . History of kidney stones   . History of renal cell carcinoma 1994   Status post right nephrectomy  . Hyperlipidemia 2003  . Hypertension   . 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 Hypothyroidism (acquired) 2000  . Nocturia   . Osteomyelitis ([place])    right great  . Osteomyelitis ([place])    RIGHT FOOT /TOE  . Pituitary mass ([place]) 2000   S/p transphenoidal excision 05/1999 ([facility])  . Vitamin D deficiency 2009   Past Surgical History:  Procedure Laterality Date  . AMPUTATION Right 12/20/2017   Procedure: RIGHT 1ST RAY AMPUTATION;  Surgeon: [name], MD;  Location: [facility];  Service: Orthopedics;  Laterality: Right;  . AMPUTATION Right 03/23/2018   Procedure: RIGHT 2ND TOE AMPUTATION;  Surgeon: [name], MD;  Location: [facility];  Service: Orthopedics;  Laterality: Right;  . BRAIN SURGERY  2000   pituatary gland removed .  Marland Kitchen CHOLECYSTECTOMY  1984  . Ectropion surgery Bilateral 2006  . EYE SURGERY  over last 6 yrs.     trabeculectomy...   . EYE SURGERY   cat ext ou  . JOINT REPLACEMENT  2011   knee left  . KNEE ARTHROSCOPY Right 02/06/2015   Procedure: RIGHT ARTHROSCOPY KNEE WITH LATERAL MENSICAL  DEBRIDEMENT;  Surgeon: Gaynelle Arabian, MD;  Location[facility];  Service: Orthopedics;  Laterality: Right;  . Mohs procedure      for skin cancer on nose   . NEPHRECTOMY Right 1994   Renal cell  . REVERSE SHOULDER ARTHROPLASTY  12/15/2011    Procedure: REVERSE SHOULDER ARTHROPLASTY;  Surgeon: Marin Shutter, MD;  Location: [facility];  Service: Orthopedics;  Laterality: Right;  right total reverse shoulder  . TONSILLECTOMY    . TOTAL KNEE ARTHROPLASTY Right 06/29/2015   Procedure: TOTAL KNEE ARTHROPLASTY;  Surgeon: Gaynelle Arabian, MD;  [facility];  Service: Orthopedics;  Laterality: Right;  . Transsphenoidal excision pituitary tumor  05/1999   A.Tommi Rumps, M.D.([facility])    reports that he quit smoking about 29 years ago. His smoking use included pipe. He has never used smokeless tobacco. He reports that he drinks alcohol. He reports that he does not use drugs. Social History   Socioeconomic History  .  Marital status: Widowed    Spouse name: Not on file  . Number of children: Not on file  . Years of education: Not on file  . Highest education level: Not on file  Occupational History  . Not on file  Social Needs  . Financial resource strain: Not on file  . Food insecurity:    Worry: Not on file    Inability: Not on file  . Transportation needs:    Medical: Not on file    Non-medical: Not on file  Tobacco Use  . Smoking status: Former Smoker    Types: Pipe    Last attempt to quit: 09/26/1988    Years since quitting: 29.5  . Smokeless tobacco: Never Used  . Tobacco comment: about 20 years  Substance and Sexual Activity  . Alcohol use: Yes    Comment: 1 ounces of vodka nightly   . Drug use: No  . Sexual activity: Not Currently  Lifestyle  . Physical activity:    Days per week: Not on file    Minutes per session: Not on file  . Stress: Not on file  Relationships  . Social connections:    Talks on phone: Not on file    Gets together: Not on file    Attends religious service: Not on file    Active member of club or organization: Not on file    Attends meetings of clubs or organizations: Not on file    Relationship status: Not on file  . Intimate partner violence:    Fear of current or ex partner: Not on file     Emotionally abused: Not on file    Physically abused: Not on file    Forced sexual activity: Not on file  Other Topics Concern  . Not on file  Social History Narrative   Patient is Married Software engineer). Retired Engineer, mining) Administrator, sports. Lives in apartment, Independent Living section at [facility] since 2000.  Spouse lives in skilled nursing section in same community   Smoking 1994 , moderate alcohol use: 2 drinks a night. Exercises regularly with walking and exercise classes. Continues to drive   Patient has Advanced planning documents: Living Will                Functional Status Survey:    Family History  Problem Relation Age of Onset  . Lung cancer Father   . Anesthesia problems Neg Hx   . Hypotension Neg Hx   . Malignant hyperthermia Neg Hx   . Pseudochol deficiency Neg Hx     Health Maintenance  Topic Date Due  . FOOT EXAM  01/22/1935  . OPHTHALMOLOGY EXAM  01/22/1935  . URINE MICROALBUMIN  01/22/1935  . TETANUS/TDAP  10/27/2008  . INFLUENZA VACCINE  03/29/2018  . HEMOGLOBIN A1C  09/20/2018  . PNA vac Low Risk Adult  Completed    Allergies  Allergen Reactions  . Adhesive [Tape] Other (See Comments)    CAUSES SKIN TEARS, prefers paper tape   . Percocet [Oxycodone-Acetaminophen] Other (See Comments)    UNSPECIFIED REACTION  Does not want to take  . Robaxin [Methocarbamol]     UNSPECIFIED REACTION   . Diovan [Valsartan] Rash    Outpatient Encounter Medications as of 03/27/2018  Medication Sig  . acetaminophen (TYLENOL) 500 MG tablet Take 500 mg by mouth daily as needed for fever.   . Cholecalciferol (VITAMIN D) 2000 units tablet Take 2,000 Units by mouth daily.  . Coenzyme Q10 300  MG CAPS Take 300 mg by mouth daily.   . diclofenac sodium (VOLTAREN) 1 % GEL Apply 2 g topically 4 (four) times daily.  Marland Kitchen docusate sodium (COLACE) 100 MG capsule Take 200 mg by mouth at bedtime.  Marland Kitchen doxycycline (VIBRAMYCIN) 100 MG capsule Take 1  capsule (100 mg total) by mouth 2 (two) times daily for 7 days.  . finasteride (PROSCAR) 5 MG tablet Take 5 mg by mouth daily.   . Multiple Vitamins-Minerals (MULTIVITAMIN & MINERAL PO) Take 1 tablet by mouth daily.  . Omega-3 Fatty Acids (OMEGA-3 FISH OIL PO) Take 700 mg by mouth daily.  Vladimir Faster Glycol-Propyl Glycol (SYSTANE OP) Place 1 drop into both eyes daily as needed (dry eyes).  . pravastatin (PRAVACHOL) 80 MG tablet Take 80 mg by mouth every other day.   . predniSONE (DELTASONE) 5 MG tablet Take 2.5-5 mg by mouth See admin instructions. 5mg  in the morning and 2.5mg  in the evening  . SYNTHROID 88 MCG tablet Take 88 mcg by mouth daily before breakfast.   . testosterone cypionate (DEPOTESTOSTERONE CYPIONATE) 200 MG/ML injection Inject 30 mg (0.15 ml) intramuscularly weekly. Must discard vial after 90 days.  . vitamin B-12 (CYANOCOBALAMIN) 500 MCG tablet Take 500 mcg by mouth daily.  . [DISCONTINUED] Misc Natural Products (FIBER 7) POWD Take 2 Doses by mouth at bedtime.   No facility-administered encounter medications on file as of 03/27/2018.     Review of Systems  Constitutional: Positive for activity change, fatigue and unexpected weight change. Negative for appetite change, chills and fever.  HENT: Negative for congestion.   Eyes: Negative for visual disturbance.  Respiratory: Negative for cough, chest tightness and shortness of breath.   Cardiovascular: Negative for chest pain, palpitations and leg swelling.  Gastrointestinal: Negative for constipation.  Genitourinary: Negative for dysuria.  Musculoskeletal: Positive for gait problem. Negative for joint swelling.       Using walker  Neurological: Negative for dizziness and weakness.  Hematological: Bruises/bleeds easily.  Psychiatric/Behavioral: Negative for agitation and confusion. The patient is not nervous/anxious.     Vitals:   03/27/18 1057  BP: 133/74  Pulse: 87  Resp: 18  Temp: 98.1 F (36.7 C)  TempSrc: Oral    SpO2: 99%  Weight: 166 lb (75.3 kg)  Height: 5\' 11"  (1.803 m)   Body mass index is 23.15 kg/m. Physical Exam  Constitutional: He is oriented to person, place, and time. He appears well-developed. No distress.  HENT:  Head: Normocephalic and atraumatic.  Eyes:  glasses  Cardiovascular: Normal rate, regular rhythm, normal heart sounds and intact distal pulses.  Pulmonary/Chest: Effort normal and breath sounds normal.  Abdominal: Soft. Bowel sounds are normal. He exhibits no distension. There is no tenderness.  Musculoskeletal: Normal range of motion.  Neurological: He is alert and oriented to person, place, and time.  Skin: There is pallor.  Right foot wrapped in kerlix and coban, remaining toes sticking out; erythema of groin and buttocks in folds  Psychiatric: He has a normal mood and affect.    Labs reviewed: Basic Metabolic Panel: Recent Labs    03/20/18 1343  03/22/18 0339 03/23/18 0400 03/24/18 0709  NA 135   < > 137 139 136  K 4.1   < > 4.2 3.9 3.7  CL 100   < > 101 104 99  CO2 26   < > 28 27 29   GLUCOSE 85   < > 111* 108* 92  BUN 16   < > 13 17  15  CREATININE 1.28*   < > 1.55* 1.46* 1.54*  CALCIUM 9.4   < > 9.2 9.0 8.8*  MG 2.0  --   --   --   --    < > = values in this interval not displayed.   Liver Function Tests: Recent Labs    11/29/17 1210 03/20/18 1343 03/21/18 0512  AST 15 17 15   ALT 13* 15 14  ALKPHOS 51 49 43  BILITOT 1.3* 0.8 1.0  PROT 5.5* 6.5 5.3*  ALBUMIN 2.7* 3.2* 2.4*   No results for input(s): LIPASE, AMYLASE in the last 8760 hours. No results for input(s): AMMONIA in the last 8760 hours. CBC: Recent Labs    11/17/17 2326  11/29/17 1210  03/22/18 0339 03/23/18 0400 03/24/18 0709  WBC 14.7*   < > 30.5*   < > 12.3* 11.6* 15.0*  NEUTROABS 11.7*  --  26.5*  --   --   --   --   HGB 10.9*   < > 10.9*   < > 12.1* 11.5* 11.7*  HCT 33.2*   < > 32.8*   < > 37.0* 35.9* 36.2*  MCV 97.1   < > 98.8   < > 98.7 98.6 98.6  PLT 270   < > 227    < > 267 286 277   < > = values in this interval not displayed.   Cardiac Enzymes: Recent Labs    03/20/18 1343 03/20/18 1943  TROPONINI <0.03 <0.03   BNP: Invalid input(s): POCBNP Lab Results  Component Value Date   HGBA1C 6.5 (H) 03/20/2018   Lab Results  Component Value Date   TSH <0.010 (L) 03/20/2018   Lab Results  Component Value Date   VITAMINB12 1,321 06/12/2017   No results found for: FOLATE No results found for: IRON, TIBC, FERRITIN  Imaging and Procedures obtained prior to SNF admission: Xr Foot Complete Right  Result Date: 03/20/2018 3 view radiographs of the right foot shows destructive bony changes of the second toe.   Assessment/Plan 1. Osteomyelitis of second toe of right foot (HCC) -complete 7 day course of doxycycline -monitoring as best able with wound entirely covered until 1 week f/u  2. History of complete ray amputation of second toe of right foot ([place]) -with Dr. Sharol Given -keep f/u appt for dressing change and then removal of sutures  3. Adrenal insufficiency ([place]) -must be maintained on his prednisone w/o missing any or holds, needs increased dose at onset of new infections  4. 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 Elyse Hsu, his endocrinologist  5. CKD (chronic kidney disease) stage 3, GFR 30-59 ml/min (HCC) -has been stable, Avoid nephrotoxic agents like nsaids, dose adjust renally excreted meds, hydrate.  6. Chronic left-sided low back pain with left-sided sciatica -did not complain of this at present, but also not as active at present as only touchdown weightbearing   Family/ staff Communication: discussed with rehab nurse  Labs/tests ordered:  Cbc in week  Alverta Caccamo L. Izaiah Tabb, D.O. [facility] [address], [place] [address] Cell Phone (Mon-Fri 8am-5pm):  [phone] On Call:  [phone] & follow prompts after 5pm & weekends Office  Phone:  [phone] Office Fax:  [phone]

## 2018-04-02 ENCOUNTER — Encounter (INDEPENDENT_AMBULATORY_CARE_PROVIDER_SITE_OTHER): Payer: Self-pay | Admitting: Orthopedic Surgery

## 2018-04-02 ENCOUNTER — Ambulatory Visit (INDEPENDENT_AMBULATORY_CARE_PROVIDER_SITE_OTHER): Payer: PPO | Admitting: Orthopedic Surgery

## 2018-04-02 VITALS — Ht 71.0 in | Wt 166.0 lb

## 2018-04-02 DIAGNOSIS — Z89422 Acquired absence of other left toe(s): Secondary | ICD-10-CM

## 2018-04-02 DIAGNOSIS — S98131A Complete traumatic amputation of one right lesser toe, initial encounter: Secondary | ICD-10-CM

## 2018-04-02 NOTE — Progress Notes (Signed)
Office Visit Note   Patient: [name] [name].           Date of Birth: 1923-05-03           MRN: [id number] Visit Date: 04/02/2018              Requested by: [name], DO [address] [place], [place] [address] PCP: [name], DO  Chief Complaint  Patient presents with  . Right Foot - Routine Post Op    03/23/18 right 2nd toe amputation      HPI: Patient is a 84 year old gentleman currently residing at [facility] in skilled nursing he has been nonweightbearing in a postoperative shoe.  Patient is status post right second toe amputation.  Assessment & Plan: Visit Diagnoses:  1. Amputated toe of right foot ([place])     Plan: Patient will advance weightbearing as tolerated with physical therapy with a postoperative shoe he will begin Dial soap cleansing and wearing the medical compression stocking.  At follow-up harvest the sutures.  Follow-Up Instructions: Return in about 1 week (around 04/09/2018).   Ortho Exam  Patient is alert, oriented, no adenopathy, well-dressed, normal affect, normal respiratory effort. Examination patient's surgical incision is well approximated there is healing well there is no drainage no cellulitis no signs of infection.  Imaging: No results found. No images are attached to the encounter.  Labs: Lab Results  Component Value Date   HGBA1C 6.5 (H) 03/20/2018   HGBA1C 6.2 (H) 11/18/2017   ESRSEDRATE 35 (H) 11/29/2017   ESRSEDRATE 57 (H) 11/18/2017   CRP 11.4 (H) 11/29/2017   CRP 8.5 (H) 11/18/2017   REPTSTATUS 03/25/2018 FINAL 03/20/2018   CULT  03/20/2018    NO GROWTH 5 DAYS Performed at [facility], [place] [address] [place], [place] [address]      Lab Results  Component Value Date   ALBUMIN 2.4 (L) 03/21/2018   ALBUMIN 3.2 (L) 03/20/2018   ALBUMIN 2.7 (L) 11/29/2017   PREALBUMIN 14.7 (L) 11/18/2017    Body mass index is 23.15 kg/m.  Orders:  No orders of the defined types were placed in this  encounter.  No orders of the defined types were placed in this encounter.    Procedures: No procedures performed  Clinical Data: No additional findings.  ROS:  All other systems negative, except as noted in the HPI. Review of Systems  Objective: Vital Signs: Ht 5\' 11"  (1.803 m)   Wt 166 lb (75.3 kg)   BMI 23.15 kg/m   Specialty Comments:  No specialty comments available.  PMFS History: Patient Active Problem List   Diagnosis Date Noted  . History of complete ray amputation of second toe of right foot ([place]) 03/27/2018  . Chronic left-sided low back pain with left-sided sciatica 03/27/2018  . Osteomyelitis of second toe of right foot ([place]) 03/20/2018  . History of complete ray amputation of first toe of right foot ([place]) 12/28/2017  . Osteomyelitis of great toe of right foot ([place])   . Idiopathic chronic venous hypertension of both lower extremities with inflammation 12/13/2017  . Thrush 11/29/2017  . Cellulitis of right foot 11/18/2017  . Cellulitis of right ankle 11/18/2017  . Adrenal insufficiency ([place]) 07/01/2017  . Bulging of intervertebral disc between L4 and L5 07/01/2017  . At high risk for bleeding 07/01/2017  . Right buttock pain 02/08/2017  . Leukocytosis 10/12/2016  . HLD (hyperlipidemia) 04/20/2016  . BPH (benign prostatic hyperplasia) 12/08/2015  . CKD (chronic  kidney disease) stage 3, GFR 30-59 ml/min (HCC) 11/27/2015  . S/P total knee arthroplasty 07/13/2015  . OA (osteoarthritis) of knee 06/29/2015  . Ectropion 05/06/2015  . Lateral meniscal tear 02/05/2015  . Hyponatremia 10/20/2013  . Weakness 10/19/2013  . Hypertension   . 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 Hypothyroidism (acquired)   . Anemia, B12 deficiency   . Meibomian gland disease 08/06/2013  . Primary open angle glaucoma 06/19/2013  . Arthritis of shoulder region, right 12/15/2011  . Exposure keratitis 08/17/2011  . Dry eye 07/13/2011   Past Medical History:   Diagnosis Date  . Allergic rhinitis    Prior allergy shots 20 years  . Anemia   . Anemia, B12 deficiency 2000  . Arthritis    Status post left total replacement 8 2011  . BPH (benign prostatic hyperplasia) 2013  . Cancer ([place])    renal cell ca and skin cancer   . Colitis 2014  . Difficult intubation 1994   surgery had to be  stopped due to injury to "throat"  . Difficult intubation 1994   no trouble since.  Required nasotracheal intubation  '02 [facility] / ANESTHESIA RECORD FROM 2013 AND 2016 IN EPIC  . GERD (gastroesophageal reflux disease)   . Glaucoma 2007   Status post left trabeculectomy 2007  . History of colon polyps    Colonoscopy 2001  . History of kidney stones   . History of renal cell carcinoma 1994   Status post right nephrectomy  . Hyperlipidemia 2003  . Hypertension   . 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 Hypothyroidism (acquired) 2000  . Nocturia   . Osteomyelitis ([place])    right great  . Osteomyelitis ([place])    RIGHT FOOT /TOE  . Pituitary mass ([place]) 2000   S/p transphenoidal excision 05/1999 ([facility])  . Vitamin D deficiency 2009    Family History  Problem Relation Age of Onset  . Lung cancer Father   . Anesthesia problems Neg Hx   . Hypotension Neg Hx   . Malignant hyperthermia Neg Hx   . Pseudochol deficiency Neg Hx     Past Surgical History:  Procedure Laterality Date  . AMPUTATION Right 12/20/2017   Procedure: RIGHT 1ST RAY AMPUTATION;  Surgeon: [name], MD;  Location: [facility];  Service: Orthopedics;  Laterality: Right;  . AMPUTATION Right 03/23/2018   Procedure: RIGHT 2ND TOE AMPUTATION;  Surgeon: [name], MD;  Location: [facility];  Service: Orthopedics;  Laterality: Right;  . BRAIN SURGERY  2000   pituatary gland removed .  Marland Kitchen CHOLECYSTECTOMY  1984  . Ectropion surgery Bilateral 2006  . EYE SURGERY  over last 6 yrs.     trabeculectomy...   . EYE SURGERY   cat ext ou  . JOINT  REPLACEMENT  2011   knee left  . KNEE ARTHROSCOPY Right 02/06/2015   Procedure: RIGHT ARTHROSCOPY KNEE WITH LATERAL MENSICAL  DEBRIDEMENT;  Surgeon: Gaynelle Arabian, MD;  [facility];  Service: Orthopedics;  Laterality: Right;  . Mohs procedure      for skin cancer on nose   . NEPHRECTOMY Right 1994   Renal cell  . REVERSE SHOULDER ARTHROPLASTY  12/15/2011   Procedure: REVERSE SHOULDER ARTHROPLASTY;  Surgeon: Marin Shutter, MD;  Location: [facility];  Service: Orthopedics;  Laterality: Right;  right total reverse shoulder  . TONSILLECTOMY    . TOTAL KNEE ARTHROPLASTY Right 06/29/2015  Procedure: TOTAL KNEE ARTHROPLASTY;  Surgeon: Gaynelle Arabian, MD;  [facility];  Service: Orthopedics;  Laterality: Right;  . Transsphenoidal excision pituitary tumor  05/1999   A.Tommi Rumps, M.D.([facility])   Social History   Occupational History  . Not on file  Tobacco Use  . Smoking status: Former Smoker    Types: Pipe    Last attempt to quit: 09/26/1988    Years since quitting: 29.5  . Smokeless tobacco: Never Used  . Tobacco comment: about 20 years  Substance and Sexual Activity  . Alcohol use: Yes    Comment: 1 ounces of vodka nightly   . Drug use: No  . Sexual activity: Not Currently

## 2018-04-03 DIAGNOSIS — D508 Other iron deficiency anemias: Secondary | ICD-10-CM | POA: Diagnosis not present

## 2018-04-03 DIAGNOSIS — D649 Anemia, unspecified: Secondary | ICD-10-CM | POA: Diagnosis not present

## 2018-04-10 ENCOUNTER — Encounter (INDEPENDENT_AMBULATORY_CARE_PROVIDER_SITE_OTHER): Payer: Self-pay | Admitting: Orthopedic Surgery

## 2018-04-10 ENCOUNTER — Ambulatory Visit (INDEPENDENT_AMBULATORY_CARE_PROVIDER_SITE_OTHER): Payer: PPO | Admitting: Orthopedic Surgery

## 2018-04-10 VITALS — Ht 71.0 in | Wt 166.0 lb

## 2018-04-10 DIAGNOSIS — S98131A Complete traumatic amputation of one right lesser toe, initial encounter: Secondary | ICD-10-CM

## 2018-04-10 DIAGNOSIS — Z89421 Acquired absence of other right toe(s): Secondary | ICD-10-CM

## 2018-04-10 NOTE — Progress Notes (Signed)
Office Visit Note   Patient: [name] [name].           Date of Birth: 1922-11-30           MRN: [id number] Visit Date: 04/10/2018              Requested by: [name], DO [place], [place] [address] PCP: [name], DO   Assessment & Plan: Visit Diagnoses:  1. Amputated toe of right foot ([place])     Plan: Patient has return to his apartment at [facility].  He will minimize weightbearing wash with soap and water wear compression stockings follow-up in 2 weeks to remove the sutures.  Follow-Up Instructions: Return in about 2 weeks (around 04/24/2018).   Orders:  No orders of the defined types were placed in this encounter.  No orders of the defined types were placed in this encounter.     Procedures: No procedures performed   Clinical Data: No additional findings.   Subjective: Chief Complaint  Patient presents with  . Right Foot - Routine Post Op    HPI [name] is a 84 year old gentleman who is 3 weeks status post right second toe amputation.  At his last visit he was to begin weightbearing as tolerated with physical therapy.  He was allowed to shower with Dial soap to the area and wear medical compression stockings.   Review of Systems   Objective: Vital Signs: Ht 5\' 11"  (1.803 m)   Wt 166 lb (75.3 kg)   BMI 23.15 kg/m   Physical Exam examination the incision is healing nicely no redness no cellulitis no drainage no signs of infection.  Ortho Exam  Specialty Comments:  No specialty comments available.  Imaging: No results found.   PMFS History: Patient Active Problem List   Diagnosis Date Noted  . History of complete ray amputation of second toe of right foot ([place]) 03/27/2018  . Chronic left-sided low back pain with left-sided sciatica 03/27/2018  . Osteomyelitis of second toe of right foot ([place]) 03/20/2018  . History of complete ray amputation of first toe of right foot ([place]) 12/28/2017  . Osteomyelitis of great  toe of right foot ([place])   . Idiopathic chronic venous hypertension of both lower extremities with inflammation 12/13/2017  . Thrush 11/29/2017  . Cellulitis of right foot 11/18/2017  . Cellulitis of right ankle 11/18/2017  . Adrenal insufficiency ([place]) 07/01/2017  . Bulging of intervertebral disc between L4 and L5 07/01/2017  . At high risk for bleeding 07/01/2017  . Right buttock pain 02/08/2017  . Leukocytosis 10/12/2016  . HLD (hyperlipidemia) 04/20/2016  . BPH (benign prostatic hyperplasia) 12/08/2015  . CKD (chronic kidney disease) stage 3, GFR 30-59 ml/min (HCC) 11/27/2015  . S/P total knee arthroplasty 07/13/2015  . OA (osteoarthritis) of knee 06/29/2015  . Ectropion 05/06/2015  . Lateral meniscal tear 02/05/2015  . Hyponatremia 10/20/2013  . Weakness 10/19/2013  . Hypertension   . 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 Hypothyroidism (acquired)   . Anemia, B12 deficiency   . Meibomian gland disease 08/06/2013  . Primary open angle glaucoma 06/19/2013  . Arthritis of shoulder region, right 12/15/2011  . Exposure keratitis 08/17/2011  . Dry eye 07/13/2011   Past Medical History:  Diagnosis Date  . Allergic rhinitis    Prior allergy shots 20 years  . Anemia   . Anemia, B12 deficiency 2000  . Arthritis    Status post left total replacement  8 2011  . BPH (benign prostatic hyperplasia) 2013  . Cancer ([place])    renal cell ca and skin cancer   . Colitis 2014  . Difficult intubation 1994   surgery had to be  stopped due to injury to "throat"  . Difficult intubation 1994   no trouble since.  Required nasotracheal intubation  '02 [facility] / ANESTHESIA RECORD FROM 2013 AND 2016 IN EPIC  . GERD (gastroesophageal reflux disease)   . Glaucoma 2007   Status post left trabeculectomy 2007  . History of colon polyps    Colonoscopy 2001  . History of kidney stones   . History of renal cell carcinoma 1994   Status post right nephrectomy  . Hyperlipidemia 2003  .  Hypertension   . 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 Hypothyroidism (acquired) 2000  . Nocturia   . Osteomyelitis ([place])    right great  . Osteomyelitis ([place])    RIGHT FOOT /TOE  . Pituitary mass ([place]) 2000   S/p transphenoidal excision 05/1999 ([facility])  . Vitamin D deficiency 2009    Family History  Problem Relation Age of Onset  . Lung cancer Father   . Anesthesia problems Neg Hx   . Hypotension Neg Hx   . Malignant hyperthermia Neg Hx   . Pseudochol deficiency Neg Hx     Past Surgical History:  Procedure Laterality Date  . AMPUTATION Right 12/20/2017   Procedure: RIGHT 1ST RAY AMPUTATION;  Surgeon: [name], MD;  Location: [facility];  Service: Orthopedics;  Laterality: Right;  . AMPUTATION Right 03/23/2018   Procedure: RIGHT 2ND TOE AMPUTATION;  Surgeon: [name], MD;  Location: [facility];  Service: Orthopedics;  Laterality: Right;  . BRAIN SURGERY  2000   pituatary gland removed .  Marland Kitchen CHOLECYSTECTOMY  1984  . Ectropion surgery Bilateral 2006  . EYE SURGERY  over last 6 yrs.     trabeculectomy...   . EYE SURGERY   cat ext ou  . JOINT REPLACEMENT  2011   knee left  . KNEE ARTHROSCOPY Right 02/06/2015   Procedure: RIGHT ARTHROSCOPY KNEE WITH LATERAL MENSICAL  DEBRIDEMENT;  Surgeon: Gaynelle Arabian, MD;  [facility];  Service: Orthopedics;  Laterality: Right;  . Mohs procedure      for skin cancer on nose   . NEPHRECTOMY Right 1994   Renal cell  . REVERSE SHOULDER ARTHROPLASTY  12/15/2011   Procedure: REVERSE SHOULDER ARTHROPLASTY;  Surgeon: Marin Shutter, MD;  Location: [facility];  Service: Orthopedics;  Laterality: Right;  right total reverse shoulder  . TONSILLECTOMY    . TOTAL KNEE ARTHROPLASTY Right 06/29/2015   Procedure: TOTAL KNEE ARTHROPLASTY;  Surgeon: Gaynelle Arabian, MD;  [facility];  Service: Orthopedics;  Laterality: Right;  . Transsphenoidal excision pituitary tumor  05/1999   A.Tommi Rumps,  M.D.([facility])   Social History   Occupational History  . Not on file  Tobacco Use  . Smoking status: Former Smoker    Types: Pipe    Last attempt to quit: 09/26/1988    Years since quitting: 29.5  . Smokeless tobacco: Never Used  . Tobacco comment: about 20 years  Substance and Sexual Activity  . Alcohol use: Yes    Comment: 1 ounces of vodka nightly   . Drug use: No  . Sexual activity: Not Currently

## 2018-04-12 ENCOUNTER — Telehealth (INDEPENDENT_AMBULATORY_CARE_PROVIDER_SITE_OTHER): Payer: Self-pay

## 2018-04-12 DIAGNOSIS — I4891 Unspecified atrial fibrillation: Secondary | ICD-10-CM | POA: Diagnosis not present

## 2018-04-12 DIAGNOSIS — M869 Osteomyelitis, unspecified: Secondary | ICD-10-CM | POA: Diagnosis not present

## 2018-04-12 NOTE — Telephone Encounter (Signed)
IC them and advised, and made appt for eval

## 2018-04-12 NOTE — Telephone Encounter (Signed)
They did a CBC today and WBC is 11.  They have been using dial soap washes and sock, but today there is drainage on the bandaid.  There seems to be a tiny hole now in the wound.  Just wanted you to know and to see if you would have them do anything different for him?

## 2018-04-12 NOTE — Telephone Encounter (Signed)
Message on triage phone transferred from front desk vm. Nurse requesting to speak with Dr. Jess Barters assistant regarding this pts wound and lab work that was done today

## 2018-04-12 NOTE — Telephone Encounter (Signed)
Call and have patient come in next week so we can evaluate and see if treatment needs to be modified.

## 2018-04-16 ENCOUNTER — Encounter (INDEPENDENT_AMBULATORY_CARE_PROVIDER_SITE_OTHER): Payer: Self-pay | Admitting: Orthopedic Surgery

## 2018-04-16 ENCOUNTER — Ambulatory Visit (INDEPENDENT_AMBULATORY_CARE_PROVIDER_SITE_OTHER): Payer: PPO | Admitting: Orthopedic Surgery

## 2018-04-16 VITALS — Ht 71.0 in | Wt 166.0 lb

## 2018-04-16 DIAGNOSIS — Z89421 Acquired absence of other right toe(s): Secondary | ICD-10-CM

## 2018-04-16 DIAGNOSIS — S98131A Complete traumatic amputation of one right lesser toe, initial encounter: Secondary | ICD-10-CM

## 2018-04-16 NOTE — Progress Notes (Signed)
Office Visit Note   Patient: [name] [name].           Date of Birth: [birth year]           MRN: [id number] Visit Date: 04/16/2018              Requested by: [name], DO [address] [place], [place] [address] PCP: [name], DO  Chief Complaint  Patient presents with  . Right Foot - Routine Post Op    03/23/18 right 2nd toe amputation       HPI: Patient is a 84 year old gentleman who presents 3 weeks status post right foot second toe amputation.  Patient is currently residing at [facility].  He states that he was concerned that he may have a problem or possible infection by nursing.  Assessment & Plan: Visit Diagnoses:  1. Amputated toe of right foot ([place])     Plan: Examination shows the dermatitis to be resolving the incision is well-healed we will harvest the sutures he will wear the compression sock weightbearing as tolerated he will follow-up with nursing staff feels like he is not continuing to make progress.  Follow-Up Instructions: Return if symptoms worsen or fail to improve.   Ortho Exam  Patient is alert, oriented, no adenopathy, well-dressed, normal affect, normal respiratory effort. Examination incision is well-healed no redness no cellulitis no drainage no signs of infection.  Sutures are harvested today.  Imaging: No results found. No images are attached to the encounter.  Labs: Lab Results  Component Value Date   HGBA1C 6.5 (H) 03/20/2018   HGBA1C 6.2 (H) 11/18/2017   ESRSEDRATE 35 (H) 11/29/2017   ESRSEDRATE 57 (H) 11/18/2017   CRP 11.4 (H) 11/29/2017   CRP 8.5 (H) 11/18/2017   REPTSTATUS 03/25/2018 FINAL 03/20/2018   CULT  03/20/2018    NO GROWTH 5 DAYS Performed at [facility], [place] [address] [place], [place] [address]      Lab Results  Component Value Date   ALBUMIN 2.4 (L) 03/21/2018   ALBUMIN 3.2 (L) 03/20/2018   ALBUMIN 2.7 (L) 11/29/2017   PREALBUMIN 14.7 (L) 11/18/2017    Body mass index is 23.15  kg/m.  Orders:  No orders of the defined types were placed in this encounter.  No orders of the defined types were placed in this encounter.    Procedures: No procedures performed  Clinical Data: No additional findings.  ROS:  All other systems negative, except as noted in the HPI. Review of Systems  Objective: Vital Signs: Ht 5\' 11"  (1.803 m)   Wt 166 lb (75.3 kg)   BMI 23.15 kg/m   Specialty Comments:  No specialty comments available.  PMFS History: Patient Active Problem List   Diagnosis Date Noted  . History of complete ray amputation of second toe of right foot ([place]) 03/27/2018  . Chronic left-sided low back pain with left-sided sciatica 03/27/2018  . Osteomyelitis of second toe of right foot ([place]) 03/20/2018  . History of complete ray amputation of first toe of right foot ([place]) 12/28/2017  . Osteomyelitis of great toe of right foot ([place])   . Idiopathic chronic venous hypertension of both lower extremities with inflammation 12/13/2017  . Thrush 11/29/2017  . Cellulitis of right foot 11/18/2017  . Cellulitis of right ankle 11/18/2017  . Adrenal insufficiency ([place]) 07/01/2017  . Bulging of intervertebral disc between L4 and L5 07/01/2017  . At high risk for bleeding 07/01/2017  . Right buttock pain  02/08/2017  . Leukocytosis 10/12/2016  . HLD (hyperlipidemia) 04/20/2016  . BPH (benign prostatic hyperplasia) 12/08/2015  . CKD (chronic kidney disease) stage 3, GFR 30-59 ml/min (HCC) 11/27/2015  . S/P total knee arthroplasty 07/13/2015  . OA (osteoarthritis) of knee 06/29/2015  . Ectropion 05/06/2015  . Lateral meniscal tear 02/05/2015  . Hyponatremia 10/20/2013  . Weakness 10/19/2013  . Hypertension   . 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 Hypothyroidism (acquired)   . Anemia, B12 deficiency   . Meibomian gland disease 08/06/2013  . Primary open angle glaucoma 06/19/2013  . Arthritis of shoulder region, right 12/15/2011  . Exposure keratitis  08/17/2011  . Dry eye 07/13/2011   Past Medical History:  Diagnosis Date  . Allergic rhinitis    Prior allergy shots 20 years  . Anemia   . Anemia, B12 deficiency 2000  . Arthritis    Status post left total replacement 8 2011  . BPH (benign prostatic hyperplasia) 2013  . Cancer ([place])    renal cell ca and skin cancer   . Colitis 2014  . Difficult intubation 1994   surgery had to be  stopped due to injury to "throat"  . Difficult intubation 1994   no trouble since.  Required nasotracheal intubation  '02 [facility] / ANESTHESIA RECORD FROM 2013 AND 2016 IN EPIC  . GERD (gastroesophageal reflux disease)   . Glaucoma 2007   Status post left trabeculectomy 2007  . History of colon polyps    Colonoscopy 2001  . History of kidney stones   . History of renal cell carcinoma 1994   Status post right nephrectomy  . Hyperlipidemia 2003  . Hypertension   . 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 Hypothyroidism (acquired) 2000  . Nocturia   . Osteomyelitis ([place])    right great  . Osteomyelitis ([place])    RIGHT FOOT /TOE  . Pituitary mass ([place]) 2000   S/p transphenoidal excision 05/1999 ([facility])  . Vitamin D deficiency 2009    Family History  Problem Relation Age of Onset  . Lung cancer Father   . Anesthesia problems Neg Hx   . Hypotension Neg Hx   . Malignant hyperthermia Neg Hx   . Pseudochol deficiency Neg Hx     Past Surgical History:  Procedure Laterality Date  . AMPUTATION Right 12/20/2017   Procedure: RIGHT 1ST RAY AMPUTATION;  Surgeon: [name], MD;  Location: [facility];  Service: Orthopedics;  Laterality: Right;  . AMPUTATION Right 03/23/2018   Procedure: RIGHT 2ND TOE AMPUTATION;  Surgeon: [name], MD;  Location: [facility];  Service: Orthopedics;  Laterality: Right;  . BRAIN SURGERY  2000   pituatary gland removed .  Marland Kitchen CHOLECYSTECTOMY  1984  . Ectropion surgery Bilateral 2006  . EYE SURGERY  over last 6 yrs.      trabeculectomy...   . EYE SURGERY   cat ext ou  . JOINT REPLACEMENT  2011   knee left  . KNEE ARTHROSCOPY Right 02/06/2015   Procedure: RIGHT ARTHROSCOPY KNEE WITH LATERAL MENSICAL  DEBRIDEMENT;  Surgeon: Gaynelle Arabian, MD;  [facility];  Service: Orthopedics;  Laterality: Right;  . Mohs procedure      for skin cancer on nose   . NEPHRECTOMY Right 1994   Renal cell  . REVERSE SHOULDER ARTHROPLASTY  12/15/2011   Procedure: REVERSE SHOULDER ARTHROPLASTY;  Surgeon: Marin Shutter, MD;  Location: [facility];  Service: Orthopedics;  Laterality: Right;  right total reverse shoulder  . TONSILLECTOMY    . TOTAL KNEE ARTHROPLASTY Right 06/29/2015   Procedure: TOTAL KNEE ARTHROPLASTY;  Surgeon: Gaynelle Arabian, MD;  [facility];  Service: Orthopedics;  Laterality: Right;  . Transsphenoidal excision pituitary tumor  05/1999   A.Tommi Rumps, M.D.([facility])   Social History   Occupational History  . Not on file  Tobacco Use  . Smoking status: Former Smoker    Types: Pipe    Last attempt to quit: 09/26/1988    Years since quitting: 29.5  . Smokeless tobacco: Never Used  . Tobacco comment: about 20 years  Substance and Sexual Activity  . Alcohol use: Yes    Comment: 1 ounces of vodka nightly   . Drug use: No  . Sexual activity: Not Currently

## 2018-04-17 ENCOUNTER — Telehealth (INDEPENDENT_AMBULATORY_CARE_PROVIDER_SITE_OTHER): Payer: Self-pay | Admitting: Physical Medicine and Rehabilitation

## 2018-04-17 NOTE — Telephone Encounter (Signed)
Would be ok but would need notes from [facility], [facility]? [facility]? Any MRI from them?

## 2018-04-18 NOTE — Telephone Encounter (Signed)
Patient is scheduled for 8/28 at 100. He will try to get [place] notes and an MRI from [facility]. He has been seen by Dr. Elmyra Ricks and Dr. Nelva Bush.

## 2018-04-18 NOTE — Telephone Encounter (Signed)
Patient is scheduled. He states everything should be on Epic, but I asked that he try to get [place] a CD and notes anyway.

## 2018-04-18 NOTE — Telephone Encounter (Signed)
I have looked in Epic and [facility] and there are NO reports for lumbar MRI, prior injections or notes from Dr. Nelva Bush.

## 2018-04-24 ENCOUNTER — Ambulatory Visit (INDEPENDENT_AMBULATORY_CARE_PROVIDER_SITE_OTHER): Payer: PPO | Admitting: Orthopedic Surgery

## 2018-04-25 ENCOUNTER — Encounter (INDEPENDENT_AMBULATORY_CARE_PROVIDER_SITE_OTHER): Payer: Self-pay | Admitting: Orthopedic Surgery

## 2018-04-25 ENCOUNTER — Encounter (INDEPENDENT_AMBULATORY_CARE_PROVIDER_SITE_OTHER): Payer: Self-pay | Admitting: Physical Medicine and Rehabilitation

## 2018-04-25 ENCOUNTER — Ambulatory Visit (INDEPENDENT_AMBULATORY_CARE_PROVIDER_SITE_OTHER): Payer: PPO | Admitting: Physical Medicine and Rehabilitation

## 2018-04-25 VITALS — BP 160/74 | HR 62 | Temp 98.7°F | Ht 71.0 in | Wt 170.0 lb

## 2018-04-25 DIAGNOSIS — G8929 Other chronic pain: Secondary | ICD-10-CM | POA: Diagnosis not present

## 2018-04-25 DIAGNOSIS — M48062 Spinal stenosis, lumbar region with neurogenic claudication: Secondary | ICD-10-CM

## 2018-04-25 DIAGNOSIS — M25552 Pain in left hip: Secondary | ICD-10-CM | POA: Diagnosis not present

## 2018-04-25 DIAGNOSIS — M5416 Radiculopathy, lumbar region: Secondary | ICD-10-CM | POA: Diagnosis not present

## 2018-04-25 DIAGNOSIS — M5442 Lumbago with sciatica, left side: Secondary | ICD-10-CM

## 2018-04-25 DIAGNOSIS — M47816 Spondylosis without myelopathy or radiculopathy, lumbar region: Secondary | ICD-10-CM

## 2018-04-25 NOTE — Progress Notes (Signed)
 .  Numeric Pain Rating Scale and Functional Assessment Average Pain 8 Pain Right Now 4 My pain is intermittent and dull Pain is worse with: walking and some activites Pain improves with: rest   In the last MONTH (on 0-10 scale) has pain interfered with the following?  1. General activity like being  able to carry out your everyday physical activities such as walking, climbing stairs, carrying groceries, or moving a chair?  Rating(6)  2. Relation with others like being able to carry out your usual social activities and roles such as  activities at home, at work and in your community. Rating(5)  3. Enjoyment of life such that you have  been bothered by emotional problems such as feeling anxious, depressed or irritable?  Rating(0)

## 2018-05-04 ENCOUNTER — Ambulatory Visit (INDEPENDENT_AMBULATORY_CARE_PROVIDER_SITE_OTHER): Payer: PPO | Admitting: Physician Assistant

## 2018-05-04 ENCOUNTER — Encounter (INDEPENDENT_AMBULATORY_CARE_PROVIDER_SITE_OTHER): Payer: Self-pay | Admitting: Physician Assistant

## 2018-05-04 VITALS — Ht 71.0 in | Wt 170.0 lb

## 2018-05-04 DIAGNOSIS — Z89421 Acquired absence of other right toe(s): Secondary | ICD-10-CM

## 2018-05-04 DIAGNOSIS — S98131A Complete traumatic amputation of one right lesser toe, initial encounter: Secondary | ICD-10-CM

## 2018-05-04 MED ORDER — DOXYCYCLINE HYCLATE 100 MG PO CAPS: 100.0000 mg | ORAL_CAPSULE | Freq: Two times a day (BID) | ORAL | 1 refills | Status: DC

## 2018-05-04 MED ORDER — MUPIROCIN CALCIUM 2 % EX CREA: TOPICAL_CREAM | Freq: Every day | CUTANEOUS | Status: DC

## 2018-05-04 NOTE — Progress Notes (Signed)
Office Visit Note   Patient: [name] [name].           Date of Birth: 10/01/1922           MRN: [id number] Visit Date: 05/04/2018              Requested by: [name], DO [place], [place] [address] PCP: [name], DO  Chief Complaint  Patient presents with  . Right Foot - Follow-up, Routine Post Op    Amp of Right toe 8/19      HPI: Patient is a 84 year old resident of [facility] facility who presents for postoperative follow-up following right second toe amputation for osteomyelitis on 03/23/2018. He is now about 6 weeks post op and returns today as nursing staff thought he might have an infection over the amputation area.  He also has a new tiny blister over the dorsal surface of his third toe.  He is wearing a silver compression stocking on the right lower extremity daily.  Assessment & Plan: Visit Diagnoses:  1. Amputated toe of right foot ([place])     Plan: Start some doxycycline 100 mg p.o. twice daily for the next week.  Also mupirocin ointment to the third toe blister.  He is also can elevate the right lower extremity as much as possible over the next several days and we will see him in follow-up early next week.  He is going to continue his silver compression stocking to the right lower extremity.  He is having a lumbar epidural steroid injection next week here in the office with Dr. Ernestina Patches and will plan to see him that day in follow up as well.    Follow-Up Instructions: Return in about 4 days (around 05/08/2018).   Ortho Exam  Patient is alert, oriented, no adenopathy, well-dressed, normal affect, normal respiratory effort. The right foot second toe amputation site is well-healed.  There is some mild edema localized to the area and some very mild erythema.  He also has a tiny 2 mm blister over the dorsal surface of the third toe without evidence of cellulitis.  There is no drainage.  He has good pedal pulse.  He does have mild pitting edema  of both lower extremities.  Imaging: No results found. No images are attached to the encounter.  Labs: Lab Results  Component Value Date   HGBA1C 6.5 (H) 03/20/2018   HGBA1C 6.2 (H) 11/18/2017   ESRSEDRATE 35 (H) 11/29/2017   ESRSEDRATE 57 (H) 11/18/2017   CRP 11.4 (H) 11/29/2017   CRP 8.5 (H) 11/18/2017   REPTSTATUS 03/25/2018 FINAL 03/20/2018   CULT  03/20/2018    NO GROWTH 5 DAYS Performed at [facility], [place] [address], [place], [place] [address]      Lab Results  Component Value Date   ALBUMIN 2.4 (L) 03/21/2018   ALBUMIN 3.2 (L) 03/20/2018   ALBUMIN 2.7 (L) 11/29/2017   PREALBUMIN 14.7 (L) 11/18/2017    Body mass index is 23.71 kg/m.  Orders:  No orders of the defined types were placed in this encounter.  Meds ordered this encounter  Medications  . doxycycline (VIBRAMYCIN) 100 MG capsule    Sig: Take 1 capsule (100 mg total) by mouth 2 (two) times daily.    Dispense:  14 capsule    Refill:  1  . mupirocin cream (BACTROBAN) 2 %     Procedures: No procedures performed  Clinical Data: No additional findings.  ROS:  All other systems negative, except as noted in the HPI. Review of Systems  Objective: Vital Signs: Ht 5\' 11"  (1.803 m)   Wt 170 lb (77.1 kg)   BMI 23.71 kg/m   Specialty Comments:  No specialty comments available.  PMFS History: Patient Active Problem List   Diagnosis Date Noted  . History of complete ray amputation of second toe of right foot ([place]) 03/27/2018  . Chronic left-sided low back pain with left-sided sciatica 03/27/2018  . Osteomyelitis of second toe of right foot ([place]) 03/20/2018  . History of complete ray amputation of first toe of right foot ([place]) 12/28/2017  . Osteomyelitis of great toe of right foot ([place])   . Idiopathic chronic venous hypertension of both lower extremities with inflammation 12/13/2017  . Thrush 11/29/2017  . Cellulitis of right foot 11/18/2017  . Cellulitis of right ankle 11/18/2017  .  Adrenal insufficiency ([place]) 07/01/2017  . Bulging of intervertebral disc between L4 and L5 07/01/2017  . At high risk for bleeding 07/01/2017  . Right buttock pain 02/08/2017  . Leukocytosis 10/12/2016  . HLD (hyperlipidemia) 04/20/2016  . BPH (benign prostatic hyperplasia) 12/08/2015  . CKD (chronic kidney disease) stage 3, GFR 30-59 ml/min (HCC) 11/27/2015  . S/P total knee arthroplasty 07/13/2015  . OA (osteoarthritis) of knee 06/29/2015  . Ectropion 05/06/2015  . Lateral meniscal tear 02/05/2015  . Hyponatremia 10/20/2013  . Weakness 10/19/2013  . Hypertension   . 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 Hypothyroidism (acquired)   . Anemia, B12 deficiency   . Meibomian gland disease 08/06/2013  . Primary open angle glaucoma 06/19/2013  . Arthritis of shoulder region, right 12/15/2011  . Exposure keratitis 08/17/2011  . Dry eye 07/13/2011   Past Medical History:  Diagnosis Date  . Allergic rhinitis    Prior allergy shots 20 years  . Anemia   . Anemia, B12 deficiency 2000  . Arthritis    Status post left total replacement 8 2011  . BPH (benign prostatic hyperplasia) 2013  . Cancer ([place])    renal cell ca and skin cancer   . Colitis 2014  . Difficult intubation 1994   surgery had to be  stopped due to injury to "throat"  . Difficult intubation 1994   no trouble since.  Required nasotracheal intubation  '02 [facility] / ANESTHESIA RECORD FROM 2013 AND 2016 IN EPIC  . GERD (gastroesophageal reflux disease)   . Glaucoma 2007   Status post left trabeculectomy 2007  . History of colon polyps    Colonoscopy 2001  . History of kidney stones   . History of renal cell carcinoma 1994   Status post right nephrectomy  . Hyperlipidemia 2003  . Hypertension   . 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 Hypothyroidism (acquired) 2000  . Nocturia   . Osteomyelitis ([place])    right great  . Osteomyelitis ([place])    RIGHT FOOT /TOE   . Pituitary mass ([place]) 2000   S/p transphenoidal excision 05/1999 ([facility])  . Vitamin D deficiency 2009    Family History  Problem Relation Age of Onset  . Lung cancer Father   . Anesthesia problems Neg Hx   . Hypotension Neg Hx   . Malignant hyperthermia Neg Hx   . Pseudochol deficiency Neg Hx     Past Surgical History:  Procedure Laterality Date  . AMPUTATION Right 12/20/2017   Procedure: RIGHT 1ST RAY AMPUTATION;  Surgeon: Sharol Given,  Illene Regulus, MD;  Location: [facility];  Service: Orthopedics;  Laterality: Right;  . AMPUTATION Right 03/23/2018   Procedure: RIGHT 2ND TOE AMPUTATION;  Surgeon: [name], MD;  Location: [facility];  Service: Orthopedics;  Laterality: Right;  . BRAIN SURGERY  2000   pituatary gland removed .  Marland Kitchen CHOLECYSTECTOMY  1984  . Ectropion surgery Bilateral 2006  . EYE SURGERY  over last 6 yrs.     trabeculectomy...   . EYE SURGERY   cat ext ou  . JOINT REPLACEMENT  2011   knee left  . KNEE ARTHROSCOPY Right 02/06/2015   Procedure: RIGHT ARTHROSCOPY KNEE WITH LATERAL MENSICAL  DEBRIDEMENT;  Surgeon: Gaynelle Arabian, MD;  [facility];  Service: Orthopedics;  Laterality: Right;  . Mohs procedure      for skin cancer on nose   . NEPHRECTOMY Right 1994   Renal cell  . REVERSE SHOULDER ARTHROPLASTY  12/15/2011   Procedure: REVERSE SHOULDER ARTHROPLASTY;  Surgeon: Marin Shutter, MD;  Location: [facility];  Service: Orthopedics;  Laterality: Right;  right total reverse shoulder  . TONSILLECTOMY    . TOTAL KNEE ARTHROPLASTY Right 06/29/2015   Procedure: TOTAL KNEE ARTHROPLASTY;  Surgeon: Gaynelle Arabian, MD;  [facility];  Service: Orthopedics;  Laterality: Right;  . Transsphenoidal excision pituitary tumor  05/1999   A.Tommi Rumps, M.D.([facility])   Social History   Occupational History  . Not on file  Tobacco Use  . Smoking status: Former Smoker    Types: Pipe    Last attempt to quit: 09/26/1988    Years since quitting: 29.6  . Smokeless tobacco: Never Used  .  Tobacco comment: about 20 years  Substance and Sexual Activity  . Alcohol use: Yes    Comment: 1 ounces of vodka nightly   . Drug use: No  . Sexual activity: Not Currently

## 2018-05-08 ENCOUNTER — Ambulatory Visit (INDEPENDENT_AMBULATORY_CARE_PROVIDER_SITE_OTHER): Payer: PPO | Admitting: Physical Medicine and Rehabilitation

## 2018-05-08 ENCOUNTER — Encounter (INDEPENDENT_AMBULATORY_CARE_PROVIDER_SITE_OTHER): Payer: Self-pay | Admitting: Physical Medicine and Rehabilitation

## 2018-05-08 ENCOUNTER — Ambulatory Visit (INDEPENDENT_AMBULATORY_CARE_PROVIDER_SITE_OTHER): Payer: PPO | Admitting: Orthopedic Surgery

## 2018-05-08 ENCOUNTER — Encounter (INDEPENDENT_AMBULATORY_CARE_PROVIDER_SITE_OTHER): Payer: Self-pay | Admitting: Orthopedic Surgery

## 2018-05-08 ENCOUNTER — Ambulatory Visit (INDEPENDENT_AMBULATORY_CARE_PROVIDER_SITE_OTHER): Payer: Self-pay

## 2018-05-08 VITALS — BP 164/85 | HR 65 | Temp 97.7°F

## 2018-05-08 DIAGNOSIS — M5416 Radiculopathy, lumbar region: Secondary | ICD-10-CM

## 2018-05-08 DIAGNOSIS — S98131A Complete traumatic amputation of one right lesser toe, initial encounter: Secondary | ICD-10-CM

## 2018-05-08 DIAGNOSIS — Z89421 Acquired absence of other right toe(s): Secondary | ICD-10-CM

## 2018-05-08 MED ORDER — BETAMETHASONE SOD PHOS & ACET 6 (3-3) MG/ML IJ SUSP
6.0000 mg | Freq: Once | INTRAMUSCULAR | Status: AC
  Administered 2018-05-08: 6 mg

## 2018-05-08 NOTE — Progress Notes (Signed)
Office Visit Note   Patient: [name] [name].           Date of Birth: 03-31-1923           MRN: [id number] Visit Date: 05/08/2018              Requested by: [name], DO [address] [place], [place] [address] PCP: [name], DO  No chief complaint on file.     HPI: Patient is a 84 year old gentleman who presents in follow-up status post amputation right great toe with a superficial abrasion over the dorsum of the third toe.  Assessment & Plan: Visit Diagnoses:  1. Amputated toe of right foot ([place])     Plan: Patient will advance to regular shoewear continue with the knee-high compression stockings.  Follow-Up Instructions: Return if symptoms worsen or fail to improve.   Ortho Exam  Patient is alert, oriented, no adenopathy, well-dressed, normal affect, normal respiratory effort. Examination the surgical incision is well-healed the area over the third toe is approximately 2 mm in diameter 0.1 mm deep there is no cellulitis no odor no drainage no signs of infection this appears to be a well-healing superficial abrasion.  Imaging: No results found. No images are attached to the encounter.  Labs: Lab Results  Component Value Date   HGBA1C 6.5 (H) 03/20/2018   HGBA1C 6.2 (H) 11/18/2017   ESRSEDRATE 35 (H) 11/29/2017   ESRSEDRATE 57 (H) 11/18/2017   CRP 11.4 (H) 11/29/2017   CRP 8.5 (H) 11/18/2017   REPTSTATUS 03/25/2018 FINAL 03/20/2018   CULT  03/20/2018    NO GROWTH 5 DAYS Performed at [facility], [place] [address], [place], [place] [address]      Lab Results  Component Value Date   ALBUMIN 2.4 (L) 03/21/2018   ALBUMIN 3.2 (L) 03/20/2018   ALBUMIN 2.7 (L) 11/29/2017   PREALBUMIN 14.7 (L) 11/18/2017    There is no height or weight on file to calculate BMI.  Orders:  No orders of the defined types were placed in this encounter.  No orders of the defined types were placed in this encounter.    Procedures: No procedures  performed  Clinical Data: No additional findings.  ROS:  All other systems negative, except as noted in the HPI. Review of Systems  Objective: Vital Signs: There were no vitals taken for this visit.  Specialty Comments:  No specialty comments available.  PMFS History: Patient Active Problem List   Diagnosis Date Noted  . History of complete ray amputation of second toe of right foot ([place]) 03/27/2018  . Chronic left-sided low back pain with left-sided sciatica 03/27/2018  . Osteomyelitis of second toe of right foot ([place]) 03/20/2018  . History of complete ray amputation of first toe of right foot ([place]) 12/28/2017  . Osteomyelitis of great toe of right foot ([place])   . Idiopathic chronic venous hypertension of both lower extremities with inflammation 12/13/2017  . Thrush 11/29/2017  . Cellulitis of right foot 11/18/2017  . Cellulitis of right ankle 11/18/2017  . Adrenal insufficiency ([place]) 07/01/2017  . Bulging of intervertebral disc between L4 and L5 07/01/2017  . At high risk for bleeding 07/01/2017  . Right buttock pain 02/08/2017  . Leukocytosis 10/12/2016  . HLD (hyperlipidemia) 04/20/2016  . BPH (benign prostatic hyperplasia) 12/08/2015  . CKD (chronic kidney disease) stage 3, GFR 30-59 ml/min (HCC) 11/27/2015  . S/P total knee arthroplasty 07/13/2015  . OA (osteoarthritis) of knee 06/29/2015  .  Ectropion 05/06/2015  . Lateral meniscal tear 02/05/2015  . Hyponatremia 10/20/2013  . Weakness 10/19/2013  . Hypertension   . 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 Hypothyroidism (acquired)   . Anemia, B12 deficiency   . Meibomian gland disease 08/06/2013  . Primary open angle glaucoma 06/19/2013  . Arthritis of shoulder region, right 12/15/2011  . Exposure keratitis 08/17/2011  . Dry eye 07/13/2011   Past Medical History:  Diagnosis Date  . Allergic rhinitis    Prior allergy shots 20 years  . Anemia   . Anemia, B12 deficiency 2000  . Arthritis     Status post left total replacement 8 2011  . BPH (benign prostatic hyperplasia) 2013  . Cancer ([place])    renal cell ca and skin cancer   . Colitis 2014  . Difficult intubation 1994   surgery had to be  stopped due to injury to "throat"  . Difficult intubation 1994   no trouble since.  Required nasotracheal intubation  '02 [facility] / ANESTHESIA RECORD FROM 2013 AND 2016 IN EPIC  . GERD (gastroesophageal reflux disease)   . Glaucoma 2007   Status post left trabeculectomy 2007  . History of colon polyps    Colonoscopy 2001  . History of kidney stones   . History of renal cell carcinoma 1994   Status post right nephrectomy  . Hyperlipidemia 2003  . Hypertension   . 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 Hypothyroidism (acquired) 2000  . Nocturia   . Osteomyelitis ([place])    right great  . Osteomyelitis ([place])    RIGHT FOOT /TOE  . Pituitary mass ([place]) 2000   S/p transphenoidal excision 05/1999 ([facility])  . Vitamin D deficiency 2009    Family History  Problem Relation Age of Onset  . Lung cancer Father   . Anesthesia problems Neg Hx   . Hypotension Neg Hx   . Malignant hyperthermia Neg Hx   . Pseudochol deficiency Neg Hx     Past Surgical History:  Procedure Laterality Date  . AMPUTATION Right 12/20/2017   Procedure: RIGHT 1ST RAY AMPUTATION;  Surgeon: [name], MD;  Location: [facility];  Service: Orthopedics;  Laterality: Right;  . AMPUTATION Right 03/23/2018   Procedure: RIGHT 2ND TOE AMPUTATION;  Surgeon: [name], MD;  Location: [facility];  Service: Orthopedics;  Laterality: Right;  . BRAIN SURGERY  2000   pituatary gland removed .  Marland Kitchen CHOLECYSTECTOMY  1984  . Ectropion surgery Bilateral 2006  . EYE SURGERY  over last 6 yrs.     trabeculectomy...   . EYE SURGERY   cat ext ou  . JOINT REPLACEMENT  2011   knee left  . KNEE ARTHROSCOPY Right 02/06/2015   Procedure: RIGHT ARTHROSCOPY KNEE WITH LATERAL MENSICAL  DEBRIDEMENT;   Surgeon: Gaynelle Arabian, MD;  Location[facility];  Service: Orthopedics;  Laterality: Right;  . Mohs procedure      for skin cancer on nose   . NEPHRECTOMY Right 1994   Renal cell  . REVERSE SHOULDER ARTHROPLASTY  12/15/2011   Procedure: REVERSE SHOULDER ARTHROPLASTY;  Surgeon: Marin Shutter, MD;  Location: [facility];  Service: Orthopedics;  Laterality: Right;  right total reverse shoulder  . TONSILLECTOMY    . TOTAL KNEE ARTHROPLASTY Right 06/29/2015   Procedure: TOTAL KNEE ARTHROPLASTY;  Surgeon: Gaynelle Arabian, MD;  [facility];  Service: Orthopedics;  Laterality: Right;  . Transsphenoidal excision pituitary tumor  05/1999   A.Tommi Rumps, M.D.([facility])   Social History   Occupational History  . Not on file  Tobacco Use  . Smoking status: Former Smoker    Types: Pipe    Last attempt to quit: 09/26/1988    Years since quitting: 29.6  . Smokeless tobacco: Never Used  . Tobacco comment: about 20 years  Substance and Sexual Activity  . Alcohol use: Yes    Comment: 1 ounces of vodka nightly   . Drug use: No  . Sexual activity: Not Currently

## 2018-05-08 NOTE — Progress Notes (Signed)
 .  Numeric Pain Rating Scale and Functional Assessment Average Pain 8   In the last MONTH (on 0-10 scale) has pain interfered with the following?  1. General activity like being  able to carry out your everyday physical activities such as walking, climbing stairs, carrying groceries, or moving a chair?  Rating(4)   +Driver, -BT, -Dye Allergies.  

## 2018-05-08 NOTE — Patient Instructions (Signed)

## 2018-05-09 NOTE — Procedures (Signed)
Lumbosacral Transforaminal Epidural Steroid Injection - Sub-Pedicular Approach with Fluoroscopic Guidance  Patient: [name] [name].      Date of Birth: [birth year] MRN: [id number] PCP: [name], DO      Visit Date: 05/08/2018   Universal Protocol:    Date/Time: 05/08/2018  Consent Given By: the patient  Position: PRONE  Additional Comments: Vital signs were monitored before and after the procedure. Patient was prepped and draped in the usual sterile fashion. The correct patient, procedure, and site was verified.   Injection Procedure Details:  Procedure Site One Meds Administered:  Meds ordered this encounter  Medications  . betamethasone acetate-betamethasone sodium phosphate (CELESTONE) injection 6 mg    Laterality: Left  Location/Site:  L5-S1  Needle size: 22 G  Needle type: Spinal  Needle Placement: Transforaminal  Findings:    -Comments: Excellent flow of contrast along the nerve and into the epidural space.  Procedure Details: After squaring off the end-plates to get a true AP view, the C-arm was positioned so that an oblique view of the foramen as noted above was visualized. The target area is just inferior to the "nose of the scotty dog" or sub pedicular. The soft tissues overlying this structure were infiltrated with 2-3 ml. of 1% Lidocaine without Epinephrine.  The spinal needle was inserted toward the target using a "trajectory" view along the fluoroscope beam.  Under AP and lateral visualization, the needle was advanced so it did not puncture dura and was located close the 6 O'Clock position of the pedical in AP tracterory. Biplanar projections were used to confirm position. Aspiration was confirmed to be negative for CSF and/or blood. A 1-2 ml. volume of Isovue-250 was injected and flow of contrast was noted at each level. Radiographs were obtained for documentation purposes.   After attaining the desired flow of contrast documented above, a 0.5  to 1.0 ml test dose of 0.25% Marcaine was injected into each respective transforaminal space.  The patient was observed for 90 seconds post injection.  After no sensory deficits were reported, and normal lower extremity motor function was noted,   the above injectate was administered so that equal amounts of the injectate were placed at each foramen (level) into the transforaminal epidural space.   Additional Comments:  The patient tolerated the procedure well Dressing: Band-Aid    Post-procedure details: Patient was observed during the procedure. Post-procedure instructions were reviewed.  Patient left the clinic in stable condition.

## 2018-05-09 NOTE — Progress Notes (Signed)
[name] [name]. - 84 y.o. male MRN [id number]  Date of birth: 1923-01-12  Office Visit Note: Visit Date: 05/08/2018 PCP: [name], DO Referred by: [name], DO  Subjective: Chief Complaint  Patient presents with  . Lower Back - Pain  . Left Leg - Pain   HPI: [name] [name] is a 84 year old gentleman who returns today for planned left L5 transforaminal epidural steroid injection.  Patient's complaint is left buttock hip and leg pain in an L5 distribution.  Prior injection by Dr. Herma Mering which was an L4-5 interlaminar injection and L4 transforaminal injection did not give him any relief at all.  Patient does have stenosis at L4-5 which is moderate he does have some foraminal narrowing at L4 however on review of his MRI which was done at [facility] did show facet joint arthropathy at L5-S1 with small facet joint cyst which could irritate the L5 nerve root.  Depending on relief with the injection today we would look at facet joint block at one time.  Please see our prior evaluation and management note for further details and justification.   ROS Otherwise per HPI.  Assessment & Plan: Visit Diagnoses:  1. Lumbar radiculopathy     Plan: No additional findings.   Meds & Orders:  Meds ordered this encounter  Medications  . betamethasone acetate-betamethasone sodium phosphate (CELESTONE) injection 6 mg    Orders Placed This Encounter  Procedures  . XR C-ARM NO REPORT  . Epidural Steroid injection    Follow-up: Return in about 2 weeks (around 05/22/2018).   Procedures: No procedures performed  Lumbosacral Transforaminal Epidural Steroid Injection - Sub-Pedicular Approach with Fluoroscopic Guidance  Patient: [name] [name].      Date of Birth: 11-09-1922 MRN: [id number] PCP: [name], DO      Visit Date: 05/08/2018   Universal Protocol:    Date/Time: 05/08/2018  Consent Given By: the patient  Position: PRONE  Additional Comments: Vital  signs were monitored before and after the procedure. Patient was prepped and draped in the usual sterile fashion. The correct patient, procedure, and site was verified.   Injection Procedure Details:  Procedure Site One Meds Administered:  Meds ordered this encounter  Medications  . betamethasone acetate-betamethasone sodium phosphate (CELESTONE) injection 6 mg    Laterality: Left  Location/Site:  L5-S1  Needle size: 22 G  Needle type: Spinal  Needle Placement: Transforaminal  Findings:    -Comments: Excellent flow of contrast along the nerve and into the epidural space.  Procedure Details: After squaring off the end-plates to get a true AP view, the C-arm was positioned so that an oblique view of the foramen as noted above was visualized. The target area is just inferior to the "nose of the scotty dog" or sub pedicular. The soft tissues overlying this structure were infiltrated with 2-3 ml. of 1% Lidocaine without Epinephrine.  The spinal needle was inserted toward the target using a "trajectory" view along the fluoroscope beam.  Under AP and lateral visualization, the needle was advanced so it did not puncture dura and was located close the 6 O'Clock position of the pedical in AP tracterory. Biplanar projections were used to confirm position. Aspiration was confirmed to be negative for CSF and/or blood. A 1-2 ml. volume of Isovue-250 was injected and flow of contrast was noted at each level. Radiographs were obtained for documentation purposes.   After attaining the desired flow of contrast documented above, a 0.5 to 1.0  ml test dose of 0.25% Marcaine was injected into each respective transforaminal space.  The patient was observed for 90 seconds post injection.  After no sensory deficits were reported, and normal lower extremity motor function was noted,   the above injectate was administered so that equal amounts of the injectate were placed at each foramen (level) into the  transforaminal epidural space.   Additional Comments:  The patient tolerated the procedure well Dressing: Band-Aid    Post-procedure details: Patient was observed during the procedure. Post-procedure instructions were reviewed.  Patient left the clinic in stable condition.     Clinical History: No specialty comments available.   He reports that he quit smoking about 29 years ago. His smoking use included pipe. He has never used smokeless tobacco.  Recent Labs    11/18/17 0644 03/20/18 1349  HGBA1C 6.2* 6.5*    Objective:  VS:  HT:    WT:   BMI:     BP:(!) 164/85  HR:65bpm  TEMP:97.7 F (36.5 C)(Oral)  RESP:  Physical Exam  Ortho Exam Imaging: Xr C-arm No Report  Result Date: 05/08/2018 Please see Notes tab for imaging impression.   Past Medical/Family/Surgical/Social History: Medications & Allergies reviewed per EMR, new medications updated. Patient Active Problem List   Diagnosis Date Noted  . 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 Chronic left-sided low back pain with left-sided sciatica 03/27/2018  . Osteomyelitis of second toe of right foot ([place]) 03/20/2018  . History of complete ray amputation of first toe of right foot ([place]) 12/28/2017  . Osteomyelitis of great toe of right foot ([place])   . Idiopathic chronic venous hypertension of both lower extremities with inflammation 12/13/2017  . Thrush 11/29/2017  . Cellulitis of right foot 11/18/2017  . Cellulitis of right ankle 11/18/2017  . Adrenal insufficiency ([place]) 07/01/2017  . Bulging of intervertebral disc between L4 and L5 07/01/2017  . At high risk for bleeding 07/01/2017  . Right buttock pain 02/08/2017  . Leukocytosis 10/12/2016  . HLD (hyperlipidemia) 04/20/2016  . BPH (benign prostatic hyperplasia) 12/08/2015  . CKD (chronic kidney disease) stage 3, GFR 30-59 ml/min (HCC) 11/27/2015  . S/P total knee arthroplasty 07/13/2015  . OA (osteoarthritis) of  knee 06/29/2015  . Ectropion 05/06/2015  . Lateral meniscal tear 02/05/2015  . Hyponatremia 10/20/2013  . Weakness 10/19/2013  . Hypertension   . 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 Hypothyroidism (acquired)   . Anemia, B12 deficiency   . Meibomian gland disease 08/06/2013  . Primary open angle glaucoma 06/19/2013  . Arthritis of shoulder region, right 12/15/2011  . Exposure keratitis 08/17/2011  . Dry eye 07/13/2011   Past Medical History:  Diagnosis Date  . Allergic rhinitis    Prior allergy shots 20 years  . Anemia   . Anemia, B12 deficiency 2000  . Arthritis    Status post left total replacement 8 2011  . BPH (benign prostatic hyperplasia) 2013  . Cancer ([place])    renal cell ca and skin cancer   . Colitis 2014  . Difficult intubation 1994   surgery had to be  stopped due to injury to "throat"  . Difficult intubation 1994   no trouble since.  Required nasotracheal intubation  '02 [facility] / ANESTHESIA RECORD FROM 2013 AND 2016 IN EPIC  . GERD (gastroesophageal reflux disease)   . Glaucoma 2007   Status post left trabeculectomy 2007  . History of colon polyps  Colonoscopy 2001  . History of kidney stones   . History of renal cell carcinoma 1994   Status post right nephrectomy  . Hyperlipidemia 2003  . Hypertension   . 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 Hypothyroidism (acquired) 2000  . Nocturia   . Osteomyelitis ([place])    right great  . Osteomyelitis ([place])    RIGHT FOOT /TOE  . Pituitary mass ([place]) 2000   S/p transphenoidal excision 05/1999 ([facility])  . Vitamin D deficiency 2009   Family History  Problem Relation Age of Onset  . Lung cancer Father   . Anesthesia problems Neg Hx   . Hypotension Neg Hx   . Malignant hyperthermia Neg Hx   . Pseudochol deficiency Neg Hx    Past Surgical History:  Procedure Laterality Date  . AMPUTATION Right 12/20/2017   Procedure: RIGHT 1ST RAY AMPUTATION;   Surgeon: [name], MD;  Location: [facility];  Service: Orthopedics;  Laterality: Right;  . AMPUTATION Right 03/23/2018   Procedure: RIGHT 2ND TOE AMPUTATION;  Surgeon: [name], MD;  Location: [facility] [facility];  Service: Orthopedics;  Laterality: Right;  . BRAIN SURGERY  2000   pituatary gland removed .  Marland Kitchen CHOLECYSTECTOMY  1984  . Ectropion surgery Bilateral 2006  . EYE SURGERY  over last 6 yrs.     trabeculectomy...   . EYE SURGERY   cat ext ou  . JOINT REPLACEMENT  2011   knee left  . KNEE ARTHROSCOPY Right 02/06/2015   Procedure: RIGHT ARTHROSCOPY KNEE WITH LATERAL MENSICAL  DEBRIDEMENT;  Surgeon: Gaynelle Arabian, MD;  [facility];  Service: Orthopedics;  Laterality: Right;  . Mohs procedure      for skin cancer on nose   . NEPHRECTOMY Right 1994   Renal cell  . REVERSE SHOULDER ARTHROPLASTY  12/15/2011   Procedure: REVERSE SHOULDER ARTHROPLASTY;  Surgeon: Marin Shutter, MD;  Location: [facility];  Service: Orthopedics;  Laterality: Right;  right total reverse shoulder  . TONSILLECTOMY    . TOTAL KNEE ARTHROPLASTY Right 06/29/2015   Procedure: TOTAL KNEE ARTHROPLASTY;  Surgeon: Gaynelle Arabian, MD;  [facility];  Service: Orthopedics;  Laterality: Right;  . Transsphenoidal excision pituitary tumor  05/1999   A.Tommi Rumps, M.D.([facility])   Social History   Occupational History  . Not on file  Tobacco Use  . Smoking status: Former Smoker    Types: Pipe    Last attempt to quit: 09/26/1988    Years since quitting: 29.6  . Smokeless tobacco: Never Used  . Tobacco comment: about 20 years  Substance and Sexual Activity  . Alcohol use: Yes    Comment: 1 ounces of vodka nightly   . Drug use: No  . Sexual activity: Not Currently

## 2018-05-14 NOTE — Progress Notes (Signed)
[name] [name]. - 84 y.o. male MRN [id number]  Date of birth: Jul 28, 1923  Office Visit Note: Visit Date: 04/25/2018 PCP: [name], DO Referred by: [name], DO  Subjective: Chief Complaint  Patient presents with  . Lower Back - Pain  . Left Leg - Pain  . Left Hip - Pain   HPI: [name] [name] is an 84 year old gentleman who resides at [facility] comes in today as a self-referral for chronic recalcitrant double intermittent low back pain radiates into the left posterior lateral hip and down the left leg in a posterior lateral fashion to the calf.  He reports to me that this is been ongoing for many months without any relief.  He is tried activity modification and medication without relief.  He is actually been seen and evaluated by Dr. Gaynelle Arabian and also by Dr. Delfino Lovett Ramo's at [facility].  I have one note to review on epic that reveals that Mr. Lobue had at least one left L4 transforaminal epidural steroid injection.  The patient has had MRI of the lumbar spine which was completed at their office as well.  He did bring in that for review today.  I reviewed this fully with the patient and he states is the first time he seen the pictures.  We also discussed this along with a model of the spine.  He denies any right-sided complaints.  He does ambulate with a cane.  He reports having had 2 injections by Dr. Herma Mering with no relief at all.  Upon further questioning he says he really did not have any pain relief even from the beginning.  He has not had specific physical therapy although he is very active and he does use the exercise equipment at [facility].  He is worse with walking and some activities.  It does affect his activities of daily living.  He has not noted any focal weakness or specific trauma.  No unexplained weight loss no fevers chills or night sweats.  He has been intolerant in the past of muscle relaxers and opioids.  He has had a history of  vascular hypertension and has had a recent osteomyelitis of the great toe.  This is been followed and he has had amputation by Dr. Sharol Given.   Review of Systems  Constitutional: Negative for chills, fever, malaise/fatigue and weight loss.  HENT: Negative for hearing loss and sinus pain.   Eyes: Negative for blurred vision, double vision and photophobia.  Respiratory: Negative for cough and shortness of breath.   Cardiovascular: Negative for chest pain, palpitations and leg swelling.  Gastrointestinal: Negative for abdominal pain, nausea and vomiting.  Genitourinary: Negative for flank pain.  Musculoskeletal: Positive for back pain. Negative for myalgias.       Left radicular leg pain  Skin: Negative for itching and rash.  Neurological: Negative for tremors, focal weakness and weakness.  Endo/Heme/Allergies: Negative.   Psychiatric/Behavioral: Negative for depression.  All other systems reviewed and are negative.  Otherwise per HPI.  Assessment & Plan: Visit Diagnoses:  1. Spinal stenosis of lumbar region with neurogenic claudication   2. Lumbar radiculopathy   3. Spondylosis without myelopathy or radiculopathy, lumbar region   4. Pain in left hip   5. Chronic left-sided low back pain with left-sided sciatica     Plan: Findings:  Chronic worsening left radicular leg pain seemingly in an L5 distribution clinically.  MRI evidence of moderate multifactorial stenosis at L4-5 with foraminal stenosis at L4  on the left.  He however also has facet arthropathy significantly at L5-S1 with small facet joint cyst on the left that could actually impact the L5 nerve root and there is some lateral recess narrowing at L4-5 that could affect the L5 nerve root.  2 prior injections by Dr. Herma Mering which I suspect the first 1 was an interlaminar injection and the second 1 was a transforaminal injection at L4.  I have the notes to review totally but we do have one note from Dr. Pilar Plate Aluisio.  The patient is  somewhat limited on medications he can take.  He has had oral steroid medication and injections without relief.  I really think clinically this is an L5 radicular pattern.  I think at least one time we should try a left L5 transforaminal injection.  Sometimes the location does make a difference.  If he does not get much relief with that at least one time I would look at facet joint block at L5-S1 as this could be more of a facet joint syndrome where there is radicular pattern pain but emanating from the joint and not the nerve.  Lastly would entertain the idea that this is some type of piriformis type problem as he does have a lot of pain over the PSIS and piriformis area.  He does have physical therapy at [facility] and we would look at getting her into that regardless of the outcome of the injection.  Other medications that might be amenable could be low-dose gabapentin.  He does have some chronic kidney disease.  Greater than 50% of this visit (total duration of visit was 45 minutes) was spent in counseling and coordination of care discussing the above findings, MRI and plain.    Meds & Orders: No orders of the defined types were placed in this encounter.  No orders of the defined types were placed in this encounter.   Follow-up: Return for Left L5 transforaminal epidural steroid injection.   Procedures: No procedures performed  No notes on file   Clinical History: No specialty comments available.   He reports that he quit smoking about 29 years ago. His smoking use included pipe. He has never used smokeless tobacco.  Recent Labs    11/18/17 0644 03/20/18 1349  HGBA1C 6.2* 6.5*    Objective:  VS:  HT:5\' 11"  (180.3 cm)   WT:170 lb (77.1 kg)  BMI:23.72    BP:(!) 160/74  HR:62bpm  TEMP:98.7 F (37.1 C)(Oral)  RESP:99 % Physical Exam  Constitutional: He is oriented to person, place, and time. He appears well-developed and well-nourished. No distress.  HENT:  Head: Normocephalic and  atraumatic.  Nose: Nose normal.  Mouth/Throat: Oropharynx is clear and moist.  Eyes: Pupils are equal, round, and reactive to light. Conjunctivae are normal.  Wears glasses  Neck: Normal range of motion. Neck supple. No tracheal deviation present.  Cardiovascular: Regular rhythm and intact distal pulses.  Pulmonary/Chest: Effort normal and breath sounds normal.  Abdominal: Soft. He exhibits no distension. There is no rebound and no guarding.  Musculoskeletal: He exhibits no edema or deformity.  Patient is somewhat slow to go from sit to stand and he does have concordant back pain at least with facet loading and rotation extension to the left more than right.  He does appear to have some tenderness over the left PSIS and not the right.  No pain over the greater trochanters.  No pain with hip rotation.  He has good distal strength symmetrically.  He  has no clonus.  Neurological: He is alert and oriented to person, place, and time. He exhibits normal muscle tone. Coordination normal.  Skin: Skin is warm. No rash noted.  Psychiatric: He has a normal mood and affect. His behavior is normal.  Nursing note and vitals reviewed.   Ortho Exam Imaging: No results found.  Past Medical/Family/Surgical/Social History: Medications & Allergies reviewed per EMR, new medications updated. Patient Active Problem List   Diagnosis Date Noted  . History of complete ray amputation of second toe of right foot ([place]) 03/27/2018  . Chronic left-sided low back pain with left-sided sciatica 03/27/2018  . Osteomyelitis of second toe of right foot ([place]) 03/20/2018  . History of complete ray amputation of first toe of right foot ([place]) 12/28/2017  . Osteomyelitis of great toe of right foot ([place])   . Idiopathic chronic venous hypertension of both lower extremities with inflammation 12/13/2017  . Thrush 11/29/2017  . Cellulitis of right foot 11/18/2017  . Cellulitis of right ankle 11/18/2017  . Adrenal insufficiency  ([place]) 07/01/2017  . Bulging of intervertebral disc between L4 and L5 07/01/2017  . At high risk for bleeding 07/01/2017  . Right buttock pain 02/08/2017  . Leukocytosis 10/12/2016  . HLD (hyperlipidemia) 04/20/2016  . BPH (benign prostatic hyperplasia) 12/08/2015  . CKD (chronic kidney disease) stage 3, GFR 30-59 ml/min (HCC) 11/27/2015  . S/P total knee arthroplasty 07/13/2015  . OA (osteoarthritis) of knee 06/29/2015  . Ectropion 05/06/2015  . Lateral meniscal tear 02/05/2015  . Hyponatremia 10/20/2013  . Weakness 10/19/2013  . Hypertension   . 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 Hypothyroidism (acquired)   . Anemia, B12 deficiency   . Meibomian gland disease 08/06/2013  . Primary open angle glaucoma 06/19/2013  . Arthritis of shoulder region, right 12/15/2011  . Exposure keratitis 08/17/2011  . Dry eye 07/13/2011   Past Medical History:  Diagnosis Date  . Allergic rhinitis    Prior allergy shots 20 years  . Anemia   . Anemia, B12 deficiency 2000  . Arthritis    Status post left total replacement 8 2011  . BPH (benign prostatic hyperplasia) 2013  . Cancer ([place])    renal cell ca and skin cancer   . Colitis 2014  . Difficult intubation 1994   surgery had to be  stopped due to injury to "throat"  . Difficult intubation 1994   no trouble since.  Required nasotracheal intubation  '02 [facility] / ANESTHESIA RECORD FROM 2013 AND 2016 IN EPIC  . GERD (gastroesophageal reflux disease)   . Glaucoma 2007   Status post left trabeculectomy 2007  . History of colon polyps    Colonoscopy 2001  . History of kidney stones   . History of renal cell carcinoma 1994   Status post right nephrectomy  . Hyperlipidemia 2003  . Hypertension   . 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 Hypothyroidism (acquired) 2000  . Nocturia   . Osteomyelitis ([place])    right great  . Osteomyelitis ([place])    RIGHT FOOT /TOE  . Pituitary mass  ([place]) 2000   S/p transphenoidal excision 05/1999 ([facility])  . Vitamin D deficiency 2009   Family History  Problem Relation Age of Onset  . Lung cancer Father   . Anesthesia problems Neg Hx   . Hypotension Neg Hx   . Malignant hyperthermia Neg Hx   . Pseudochol deficiency Neg Hx    Past Surgical  History:  Procedure Laterality Date  . AMPUTATION Right 12/20/2017   Procedure: RIGHT 1ST RAY AMPUTATION;  Surgeon: [name], MD;  Location: [place];  Service: Orthopedics;  Laterality: Right;  . AMPUTATION Right 03/23/2018   Procedure: RIGHT 2ND TOE AMPUTATION;  Surgeon: [name], MD;  Location: [place];  Service: Orthopedics;  Laterality: Right;  . BRAIN SURGERY  2000   pituatary gland removed .  Marland Kitchen CHOLECYSTECTOMY  1984  . Ectropion surgery Bilateral 2006  . EYE SURGERY  over last 6 yrs.     trabeculectomy...   . EYE SURGERY   cat ext ou  . JOINT REPLACEMENT  2011   knee left  . KNEE ARTHROSCOPY Right 02/06/2015   Procedure: RIGHT ARTHROSCOPY KNEE WITH LATERAL MENSICAL  DEBRIDEMENT;  Surgeon: Gaynelle Arabian, MD;  Location: [facility];  Service: Orthopedics;  Laterality: Right;  . Mohs procedure      for skin cancer on nose   . NEPHRECTOMY Right 1994   Renal cell  . REVERSE SHOULDER ARTHROPLASTY  12/15/2011   Procedure: REVERSE SHOULDER ARTHROPLASTY;  Surgeon: Marin Shutter, MD;  Location: [place];  Service: Orthopedics;  Laterality: Right;  right total reverse shoulder  . TONSILLECTOMY    . TOTAL KNEE ARTHROPLASTY Right 06/29/2015   Procedure: TOTAL KNEE ARTHROPLASTY;  Surgeon: Gaynelle Arabian, MD;  Location: [facility];  Service: Orthopedics;  Laterality: Right;  . Transsphenoidal excision pituitary tumor  05/1999   A.Tommi Rumps, M.D.([facility])   Social History   Occupational History  . Not on file  Tobacco Use  . Smoking status: Former Smoker    Types: Pipe    Last attempt to quit: 09/26/1988    Years since quitting: 29.6  . Smokeless tobacco: Never Used  . Tobacco comment: about 20  years  Substance and Sexual Activity  . Alcohol use: Yes    Comment: 1 ounces of vodka nightly   . Drug use: No  . Sexual activity: Not Currently

## 2018-05-21 DIAGNOSIS — B351 Tinea unguium: Secondary | ICD-10-CM | POA: Diagnosis not present

## 2018-05-21 DIAGNOSIS — Z89411 Acquired absence of right great toe: Secondary | ICD-10-CM | POA: Diagnosis not present

## 2018-05-21 DIAGNOSIS — E1142 Type 2 diabetes mellitus with diabetic polyneuropathy: Secondary | ICD-10-CM | POA: Diagnosis not present

## 2018-05-24 ENCOUNTER — Ambulatory Visit (INDEPENDENT_AMBULATORY_CARE_PROVIDER_SITE_OTHER): Payer: PPO | Admitting: Physical Medicine and Rehabilitation

## 2018-05-24 ENCOUNTER — Ambulatory Visit (INDEPENDENT_AMBULATORY_CARE_PROVIDER_SITE_OTHER): Payer: Self-pay

## 2018-05-24 ENCOUNTER — Encounter (INDEPENDENT_AMBULATORY_CARE_PROVIDER_SITE_OTHER): Payer: Self-pay | Admitting: Physical Medicine and Rehabilitation

## 2018-05-24 VITALS — BP 138/67 | HR 74 | Temp 98.1°F

## 2018-05-24 DIAGNOSIS — G8929 Other chronic pain: Secondary | ICD-10-CM | POA: Diagnosis not present

## 2018-05-24 DIAGNOSIS — M47816 Spondylosis without myelopathy or radiculopathy, lumbar region: Secondary | ICD-10-CM | POA: Diagnosis not present

## 2018-05-24 DIAGNOSIS — M545 Low back pain: Secondary | ICD-10-CM | POA: Diagnosis not present

## 2018-05-24 MED ORDER — METHYLPREDNISOLONE ACETATE 80 MG/ML IJ SUSP
40.0000 mg | Freq: Once | INTRAMUSCULAR | Status: AC
  Administered 2018-05-24: 40 mg

## 2018-05-24 NOTE — Patient Instructions (Signed)

## 2018-05-24 NOTE — Progress Notes (Signed)
 .  Numeric Pain Rating Scale and Functional Assessment Average Pain 5   In the last MONTH (on 0-10 scale) has pain interfered with the following?  1. General activity like being  able to carry out your everyday physical activities such as walking, climbing stairs, carrying groceries, or moving a chair?  Rating(3)   +Driver, -BT, -Dye Allergies.  

## 2018-06-01 NOTE — Procedures (Signed)
Lumbar Facet Joint Intra-Articular Injection(s) with Fluoroscopic Guidance  Patient: [name] [name].      Date of Birth: 01-02-1923 MRN: [id number] PCP: [name], DO      Visit Date: 05/24/2018   Universal Protocol:    Date/Time: 05/24/2018  Consent Given By: the patient  Position: PRONE   Additional Comments: Vital signs were monitored before and after the procedure. Patient was prepped and draped in the usual sterile fashion. The correct patient, procedure, and site was verified.   Injection Procedure Details:  Procedure Site One Meds Administered:  Meds ordered this encounter  Medications  . methylPREDNISolone acetate (DEPO-MEDROL) injection 40 mg     Laterality: Right  Location/Site:  L5-S1  Needle size: 22 guage  Needle type: Spinal  Needle Placement: Articular  Findings:  -Comments: Excellent flow of contrast producing a partial arthrogram.  Procedure Details: The fluoroscope beam is vertically oriented in AP, and the inferior recess is visualized beneath the lower pole of the inferior apophyseal process, which represents the target point for needle insertion. When direct visualization is difficult the target point is located at the medial projection of the vertebral pedicle. The region overlying each aforementioned target is locally anesthetized with a 1 to 2 ml. volume of 1% Lidocaine without Epinephrine.   The spinal needle was inserted into each of the above mentioned facet joints using biplanar fluoroscopic guidance. A 0.25 to 0.5 ml. volume of Isovue-250 was injected and a partial facet joint arthrogram was obtained. A single spot film was obtained of the resulting arthrogram.    One to 1.25 ml of the steroid/anesthetic solution was then injected into each of the facet joints noted above.   Additional Comments:  The patient tolerated the procedure well Dressing: Band-Aid    Post-procedure details: Patient was observed during the  procedure. Post-procedure instructions were reviewed.  Patient left the clinic in stable condition.

## 2018-06-01 NOTE — Progress Notes (Signed)
[name] - 84 y.o. male MRN [id number]  Date of birth: 31-Aug-1922  Office Visit Note: Visit Date: 05/24/2018 PCP: [name], DO Referred by: [name], DO  Subjective: Chief Complaint  Patient presents with  . Lower Back - Pain   HPI:  [name] [name]. is a 84 y.o. male who comes in today 2 weeks after right L5 transforaminal epidural steroid injection.  He reports that his leg pain is essentially nonexistent at this point he is under percent relief after the injection.  He is quite pleased with this.  He still has a dull aching pain in the lower back worse with standing and I feel like this is the facet joint arthritis at L5-S1 we are going go ahead and complete the facet joint block diagnostically hopefully therapeutically.  He gets good relief with this we would look at the second block and look forward to radiofrequency ablation.  He also is asking about anything he can do for his hands today.  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  We gave him a sample of Pen[place]aid2.  ROS Otherwise per HPI.  Assessment & Plan: Visit Diagnoses:  1. Spondylosis without myelopathy or radiculopathy, lumbar region   2. Chronic bilateral low back pain without sciatica     Plan: No additional findings.   Meds & Orders:  Meds ordered this encounter  Medications  . methylPREDNISolone acetate (DEPO-MEDROL) injection 40 mg    Orders Placed This Encounter  Procedures  . Facet Injection  . XR C-ARM NO REPORT    Follow-up: Return if symptoms worsen or fail to improve.   Procedures: No procedures performed  Lumbar Facet Joint Intra-Articular Injection(s) with Fluoroscopic Guidance  Patient: [name] [name].      Date of Birth: August 13, 1923 MRN: [id number] PCP: [name], DO      Visit Date: 05/24/2018   Universal Protocol:    Date/Time: 05/24/2018  Consent Given By: the patient  Position: PRONE   Additional Comments: Vital signs were monitored before and after  the procedure. Patient was prepped and draped in the usual sterile fashion. The correct patient, procedure, and site was verified.   Injection Procedure Details:  Procedure Site One Meds Administered:  Meds ordered this encounter  Medications  . methylPREDNISolone acetate (DEPO-MEDROL) injection 40 mg     Laterality: Right  Location/Site:  L5-S1  Needle size: 22 guage  Needle type: Spinal  Needle Placement: Articular  Findings:  -Comments: Excellent flow of contrast producing a partial arthrogram.  Procedure Details: The fluoroscope beam is vertically oriented in AP, and the inferior recess is visualized beneath the lower pole of the inferior apophyseal process, which represents the target point for needle insertion. When direct visualization is difficult the target point is located at the medial projection of the vertebral pedicle. The region overlying each aforementioned target is locally anesthetized with a 1 to 2 ml. volume of 1% Lidocaine without Epinephrine.   The spinal needle was inserted into each of the above mentioned facet joints using biplanar fluoroscopic guidance. A 0.25 to 0.5 ml. volume of Isovue-250 was injected and a partial facet joint arthrogram was obtained. A single spot film was obtained of the resulting arthrogram.    One to 1.25 ml of the steroid/anesthetic solution was then injected into each of the facet joints noted above.   Additional Comments:  The patient tolerated the procedure well Dressing: Band-Aid    Post-procedure details: Patient was  observed during the procedure. Post-procedure instructions were reviewed.  Patient left the clinic in stable condition.    Clinical History: Lumbar spine MRI dated 2018 is completed at [facility].  At L4-5 there is moderate multifactorial stenosis with foraminal narrowing and disc protrusion on the left.  At L5-S1 there is facet arthropathy which is pretty significant bilaterally with facet  joint cyst on the left.     Objective:  VS:  HT:    WT:   BMI:     BP:138/67  HR:74bpm  TEMP:98.1 F (36.7 C)(Oral)  RESP:  Physical Exam  Ortho Exam Imaging: No results found.

## 2018-06-07 ENCOUNTER — Telehealth (INDEPENDENT_AMBULATORY_CARE_PROVIDER_SITE_OTHER): Payer: Self-pay | Admitting: Physical Medicine and Rehabilitation

## 2018-06-07 NOTE — Telephone Encounter (Signed)
Repeat Left L5 tf esi, we did facet after TF not interlam.

## 2018-06-08 NOTE — Telephone Encounter (Signed)
Scheduled for 06/22/18 at 1530 with driver.

## 2018-06-08 NOTE — Telephone Encounter (Signed)
Tried to call patient to schedule. No answer and no voicemail.

## 2018-06-15 ENCOUNTER — Telehealth (INDEPENDENT_AMBULATORY_CARE_PROVIDER_SITE_OTHER): Payer: Self-pay | Admitting: Radiology

## 2018-06-15 ENCOUNTER — Other Ambulatory Visit (INDEPENDENT_AMBULATORY_CARE_PROVIDER_SITE_OTHER): Payer: Self-pay | Admitting: Physical Medicine and Rehabilitation

## 2018-06-15 MED ORDER — ACETAMINOPHEN-CODEINE #3 300-30 MG PO TABS: 1.0000 | ORAL_TABLET | Freq: Three times a day (TID) | ORAL | 0 refills | Status: AC | PRN

## 2018-06-15 NOTE — Telephone Encounter (Signed)
See message below  °Please Advise. °

## 2018-06-15 NOTE — Progress Notes (Signed)
Increased back pain, waiting on injection. Tylenol#3 Rx or patient can take tylenol TID

## 2018-06-15 NOTE — Telephone Encounter (Signed)
Patient left message states that he is scheduled for injection next Friday 06/22/18. 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  What can he do or take until Friday or can he be scheduled earlier?

## 2018-06-15 NOTE — Telephone Encounter (Signed)
I sent in Rx for tylenol#3 with small bit of codeine for severe pain or he can try 1 extra strength tylenol every 8 hrs.

## 2018-06-18 DIAGNOSIS — E7800 Pure hypercholesterolemia, unspecified: Secondary | ICD-10-CM | POA: Diagnosis not present

## 2018-06-18 DIAGNOSIS — E230 Hypopituitarism: Secondary | ICD-10-CM | POA: Diagnosis not present

## 2018-06-18 DIAGNOSIS — D510 Vitamin B12 deficiency anemia due to intrinsic factor deficiency: Secondary | ICD-10-CM | POA: Diagnosis not present

## 2018-06-18 NOTE — Telephone Encounter (Signed)
Called pt and advised, he said he has started to take it and it has been helping.

## 2018-06-21 DIAGNOSIS — E7800 Pure hypercholesterolemia, unspecified: Secondary | ICD-10-CM | POA: Diagnosis not present

## 2018-06-21 DIAGNOSIS — I10 Essential (primary) hypertension: Secondary | ICD-10-CM | POA: Diagnosis not present

## 2018-06-21 DIAGNOSIS — E559 Vitamin D deficiency, unspecified: Secondary | ICD-10-CM | POA: Diagnosis not present

## 2018-06-21 DIAGNOSIS — E230 Hypopituitarism: Secondary | ICD-10-CM | POA: Diagnosis not present

## 2018-06-21 DIAGNOSIS — D510 Vitamin B12 deficiency anemia due to intrinsic factor deficiency: Secondary | ICD-10-CM | POA: Diagnosis not present

## 2018-06-22 ENCOUNTER — Encounter (INDEPENDENT_AMBULATORY_CARE_PROVIDER_SITE_OTHER): Payer: Self-pay | Admitting: Physical Medicine and Rehabilitation

## 2018-06-22 ENCOUNTER — Ambulatory Visit (INDEPENDENT_AMBULATORY_CARE_PROVIDER_SITE_OTHER): Payer: Self-pay

## 2018-06-22 ENCOUNTER — Ambulatory Visit (INDEPENDENT_AMBULATORY_CARE_PROVIDER_SITE_OTHER): Payer: PPO | Admitting: Physical Medicine and Rehabilitation

## 2018-06-22 VITALS — BP 160/77 | HR 73 | Temp 98.3°F

## 2018-06-22 DIAGNOSIS — M5416 Radiculopathy, lumbar region: Secondary | ICD-10-CM | POA: Diagnosis not present

## 2018-06-22 DIAGNOSIS — M48062 Spinal stenosis, lumbar region with neurogenic claudication: Secondary | ICD-10-CM

## 2018-06-22 MED ORDER — BETAMETHASONE SOD PHOS & ACET 6 (3-3) MG/ML IJ SUSP
12.0000 mg | Freq: Once | INTRAMUSCULAR | Status: AC
  Administered 2018-06-22: 12 mg

## 2018-06-22 NOTE — Progress Notes (Signed)
 .  Numeric Pain Rating Scale and Functional Assessment Average Pain 6   In the last MONTH (on 0-10 scale) has pain interfered with the following?  1. General activity like being  able to carry out your everyday physical activities such as walking, climbing stairs, carrying groceries, or moving a chair?  Rating(4)   +Driver, -BT, -Dye Allergies.  

## 2018-06-22 NOTE — Patient Instructions (Signed)

## 2018-06-29 NOTE — Procedures (Signed)
Lumbosacral Transforaminal Epidural Steroid Injection - Sub-Pedicular Approach with Fluoroscopic Guidance  Patient: [name] [name].      Date of Birth: [birth year] MRN: [id number] PCP: [name], DO      Visit Date: 06/22/2018   Universal Protocol:    Date/Time: 06/22/2018  Consent Given By: the patient  Position: PRONE  Additional Comments: Vital signs were monitored before and after the procedure. Patient was prepped and draped in the usual sterile fashion. The correct patient, procedure, and site was verified.   Injection Procedure Details:  Procedure Site One Meds Administered:  Meds ordered this encounter  Medications  . betamethasone acetate-betamethasone sodium phosphate (CELESTONE) injection 12 mg    Laterality: Left  Location/Site:  L5-S1  Needle size: 22 G  Needle type: Spinal  Needle Placement: Transforaminal  Findings:    -Comments: Excellent flow of contrast along the nerve and into the epidural space.  Procedure Details: After squaring off the end-plates to get a true AP view, the C-arm was positioned so that an oblique view of the foramen as noted above was visualized. The target area is just inferior to the "nose of the scotty dog" or sub pedicular. The soft tissues overlying this structure were infiltrated with 2-3 ml. of 1% Lidocaine without Epinephrine.  The spinal needle was inserted toward the target using a "trajectory" view along the fluoroscope beam.  Under AP and lateral visualization, the needle was advanced so it did not puncture dura and was located close the 6 O'Clock position of the pedical in AP tracterory. Biplanar projections were used to confirm position. Aspiration was confirmed to be negative for CSF and/or blood. A 1-2 ml. volume of Isovue-250 was injected and flow of contrast was noted at each level. Radiographs were obtained for documentation purposes.   After attaining the desired flow of contrast documented above, a 0.5  to 1.0 ml test dose of 0.25% Marcaine was injected into each respective transforaminal space.  The patient was observed for 90 seconds post injection.  After no sensory deficits were reported, and normal lower extremity motor function was noted,   the above injectate was administered so that equal amounts of the injectate were placed at each foramen (level) into the transforaminal epidural space.   Additional Comments:  The patient tolerated the procedure well Dressing: Band-Aid    Post-procedure details: Patient was observed during the procedure. Post-procedure instructions were reviewed.  Patient left the clinic in stable condition.

## 2018-06-29 NOTE — Progress Notes (Signed)
[name] [name]. - 84 y.o. male MRN [id number]  Date of birth: 1923-01-14  Office Visit Note: Visit Date: 06/22/2018 PCP: [name], DO Referred by: [name], DO  Subjective: Chief Complaint  Patient presents with  . Lower Back - Pain  . Left Leg - Pain   HPI:  [name] [name]. is a 84 y.o. male who comes in today For worsening severe left low back and left hip and leg pain.  Initially his pain started last year and he was seen at [facility].  He has been followed by Dr. Wynelle Link.  By brief way of history he saw Dr. Delfino Lovett Ramo's on 2 occasions and they completed L4-5 interlaminar injection and L4 transforaminal injection both of which gave him no relief.  He sought out treatment here and we did do a full evaluation for him which can be reviewed.  Now after diagnostic injections we have confirmed that he has 2 problems he has left L5 radicular pain likely from lateral recess narrowing and foraminal narrowing Duda facet arthropathy consistent at L5-S1 with small facet joint cyst and also from moderate narrowing at L4-5.  Prior injections at L4 were not beneficial but left L5 transforaminal injection completely took away his leg pain.  We then sewing for facet joint block diagnostically after his leg pain was completely gone and he did get good relief documented for his back pain.  This lasted for about a month or 2 and he was very happy.  He now has had return of the leg pain.  Today we are going to repeat the left L5 transforaminal injection.  If you once again has reduction of the leg pain and still having the back pain we will complete a second diagnostic block of his facet joint and look at radiofrequency ablation.  He has had medication management with small amount of opioid and anti-inflammatory medicine and he has had physical therapy he has had interventional spine treatments.  He does not have any high-grade nerve compression.  He is not a  surgical candidate at the age of 52 although he still very active.  ROS Otherwise per HPI.  Assessment & Plan: Visit Diagnoses:  1. Lumbar radiculopathy   2. Spinal stenosis of lumbar region with neurogenic claudication     Plan: No additional findings.   Meds & Orders:  Meds ordered this encounter  Medications  . betamethasone acetate-betamethasone sodium phosphate (CELESTONE) injection 12 mg    Orders Placed This Encounter  Procedures  . XR C-ARM NO REPORT  . Epidural Steroid injection    Follow-up: Return for Left L5-S1 facet block.   Procedures: No procedures performed  Lumbosacral Transforaminal Epidural Steroid Injection - Sub-Pedicular Approach with Fluoroscopic Guidance  Patient: [name] [name].      Date of Birth: Apr 19, 1923 MRN: [id number] PCP: [name], DO      Visit Date: 06/22/2018   Universal Protocol:    Date/Time: 06/22/2018  Consent Given By: the patient  Position: PRONE  Additional Comments: Vital signs were monitored before and after the procedure. Patient was prepped and draped in the usual sterile fashion. The correct patient, procedure, and site was verified.   Injection Procedure Details:  Procedure Site One Meds Administered:  Meds ordered this encounter  Medications  . betamethasone acetate-betamethasone sodium phosphate (CELESTONE) injection 12 mg    Laterality: Left  Location/Site:  L5-S1  Needle size: 22 G  Needle type: Spinal  Needle Placement: Transforaminal  Findings:    -Comments: Excellent flow of contrast along the nerve and into the epidural space.  Procedure Details: After squaring off the end-plates to get a true AP view, the C-arm was positioned so that an oblique view of the foramen as noted above was visualized. The target area is just inferior to the "nose of the scotty dog" or sub pedicular. The soft tissues overlying this structure were infiltrated with 2-3 ml. of 1% Lidocaine without  Epinephrine.  The spinal needle was inserted toward the target using a "trajectory" view along the fluoroscope beam.  Under AP and lateral visualization, the needle was advanced so it did not puncture dura and was located close the 6 O'Clock position of the pedical in AP tracterory. Biplanar projections were used to confirm position. Aspiration was confirmed to be negative for CSF and/or blood. A 1-2 ml. volume of Isovue-250 was injected and flow of contrast was noted at each level. Radiographs were obtained for documentation purposes.   After attaining the desired flow of contrast documented above, a 0.5 to 1.0 ml test dose of 0.25% Marcaine was injected into each respective transforaminal space.  The patient was observed for 90 seconds post injection.  After no sensory deficits were reported, and normal lower extremity motor function was noted,   the above injectate was administered so that equal amounts of the injectate were placed at each foramen (level) into the transforaminal epidural space.   Additional Comments:  The patient tolerated the procedure well Dressing: Band-Aid    Post-procedure details: Patient was observed during the procedure. Post-procedure instructions were reviewed.  Patient left the clinic in stable condition.    Clinical History: Lumbar spine MRI dated 2018 is completed at [facility].  At L4-5 there is moderate multifactorial stenosis with foraminal narrowing and disc protrusion on the left.  At L5-S1 there is facet arthropathy which is pretty significant bilaterally with facet joint cyst on the left.     Objective:  VS:  HT:    WT:   BMI:     BP:(!) 160/77  HR:73bpm  TEMP:98.3 F (36.8 C)(Oral)  RESP:  Physical Exam  Ortho Exam Imaging: No results found.

## 2018-07-02 ENCOUNTER — Telehealth: Payer: Self-pay | Admitting: Internal Medicine

## 2018-07-02 NOTE — Telephone Encounter (Signed)
I left a message asking the patient to call me at [phone] to schedule AWV with Sara at [facility] on Nov. 5th if available. If not, then she will also be there on Nov. 19th. VDM (DD) °

## 2018-07-03 ENCOUNTER — Non-Acute Institutional Stay: Payer: PPO

## 2018-07-03 VITALS — BP 138/78 | HR 74 | Temp 98.1°F | Ht 71.0 in | Wt 176.0 lb

## 2018-07-03 DIAGNOSIS — Z0000 Encounter for general adult medical examination without abnormal findings: Secondary | ICD-10-CM | POA: Diagnosis not present

## 2018-07-03 NOTE — Progress Notes (Signed)
Subjective:   [name] [name]. is a 84 y.o. male who presents for Medicare Annual/Subsequent preventive examination at [facility]  Last AWV-06/20/2017    Objective:    Vitals: BP 138/78 (BP Location: Right Arm, Patient Position: Sitting)   Pulse 74   Temp 98.1 F (36.7 C) (Oral)   Ht 5\' 11"  (1.803 m)   Wt 176 lb (79.8 kg)   SpO2 97%   BMI 24.55 kg/m   Body mass index is 24.55 kg/m.  Advanced Directives 07/03/2018 03/27/2018 03/20/2018 03/20/2018 03/07/2018 01/09/2018 12/26/2017  Does Patient Have a Medical Advance Directive? Yes Yes - Yes Yes Yes Yes  Type of Advance Directive [place];Out of facility DNR (pink MOST or yellow form) [place];Living will;Out of facility DNR (pink MOST or yellow form) - [place];Out of facility DNR (pink MOST or yellow form) [place];Out of facility DNR (pink MOST or yellow form) [place];Out of facility DNR (pink MOST or yellow form) Out of facility DNR (pink MOST or yellow form);Healthcare Power of Attorney  Does patient want to make changes to medical advance directive? No - Patient declined No - Patient declined No - Patient declined - No - Patient declined No - Patient declined No - Patient declined  Copy of [place] in Chart? Yes Yes - - Yes Yes Yes  Would patient like information on creating a medical advance directive? - - - - - - -  Pre-existing out of facility DNR order (yellow form or pink MOST form) Yellow form placed in chart (order not valid for inpatient use);Pink MOST form placed in chart (order not valid for inpatient use) Yellow form placed in chart (order not valid for inpatient use);Pink MOST form placed in chart (order not valid for inpatient use) - - Yellow form placed in chart (order not valid for inpatient use);Pink MOST form placed in chart (order not valid for inpatient use) Yellow form placed  in chart (order not valid for inpatient use);Pink MOST form placed in chart (order not valid for inpatient use) Yellow form placed in chart (order not valid for inpatient use)    Tobacco Social History   Tobacco Use  Smoking Status Former Smoker  . Types: Pipe  . Last attempt to quit: 09/26/1988  . Years since quitting: 29.7  Smokeless Tobacco Never Used  Tobacco Comment   about 20 years     Counseling given: Not Answered Comment: about 20 years   Clinical Intake:  Pre-visit preparation completed: No  Pain : No/denies pain     Diabetes: No  How often do you need to have someone help you when you read instructions, pamphlets, or other written materials from your doctor or pharmacy?: 1 - Never What is the last grade level you completed in school?: graduate school  Interpreter Needed?: No  Information entered by :: Tyson Dense, RN  Past Medical History:  Diagnosis Date  . Allergic rhinitis    Prior allergy shots 20 years  . Anemia   . Anemia, B12 deficiency 2000  . Arthritis    Status post left total replacement 8 2011  . BPH (benign prostatic hyperplasia) 2013  . Cancer ([place])    renal cell ca and skin cancer   . Colitis 2014  . Difficult intubation 1994   surgery had to be  stopped due to injury to "throat"  . Difficult intubation 1994   no trouble  since.  Required nasotracheal intubation  '02 [facility] / ANESTHESIA RECORD FROM 2013 AND 2016 IN EPIC  . GERD (gastroesophageal reflux disease)   . Glaucoma 2007   Status post left trabeculectomy 2007  . History of colon polyps    Colonoscopy 2001  . History of kidney stones   . History of renal cell carcinoma 1994   Status post right nephrectomy  . Hyperlipidemia 2003  . Hypertension   . 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 Hypothyroidism (acquired) 2000  . Nocturia   . Osteomyelitis ([place])    right great  . Osteomyelitis ([place])    RIGHT FOOT /TOE  .  Pituitary mass ([place]) 2000   S/p transphenoidal excision 05/1999 ([facility])  . Vitamin D deficiency 2009   Past Surgical History:  Procedure Laterality Date  . AMPUTATION Right 12/20/2017   Procedure: RIGHT 1ST RAY AMPUTATION;  Surgeon: [name], MD;  Location: [facility];  Service: Orthopedics;  Laterality: Right;  . AMPUTATION Right 03/23/2018   Procedure: RIGHT 2ND TOE AMPUTATION;  Surgeon: [name], MD;  Location: [place];  Service: Orthopedics;  Laterality: Right;  . BRAIN SURGERY  2000   pituatary gland removed .  Marland Kitchen CHOLECYSTECTOMY  1984  . Ectropion surgery Bilateral 2006  . EYE SURGERY  over last 6 yrs.     trabeculectomy...   . EYE SURGERY   cat ext ou  . JOINT REPLACEMENT  2011   knee left  . KNEE ARTHROSCOPY Right 02/06/2015   Procedure: RIGHT ARTHROSCOPY KNEE WITH LATERAL MENSICAL  DEBRIDEMENT;  Surgeon: Gaynelle Arabian, MD;  Location: [facility];  Service: Orthopedics;  Laterality: Right;  . Mohs procedure      for skin cancer on nose   . NEPHRECTOMY Right 1994   Renal cell  . REVERSE SHOULDER ARTHROPLASTY  12/15/2011   Procedure: REVERSE SHOULDER ARTHROPLASTY;  Surgeon: Marin Shutter, MD;  Location: [facility];  Service: Orthopedics;  Laterality: Right;  right total reverse shoulder  . TONSILLECTOMY    . TOTAL KNEE ARTHROPLASTY Right 06/29/2015   Procedure: TOTAL KNEE ARTHROPLASTY;  Surgeon: Gaynelle Arabian, MD;  Location: [facility];  Service: Orthopedics;  Laterality: Right;  . Transsphenoidal excision pituitary tumor  05/1999   A.Tommi Rumps, M.D.([facility])   Family History  Problem Relation Age of Onset  . Lung cancer Father   . Anesthesia problems Neg Hx   . Hypotension Neg Hx   . Malignant hyperthermia Neg Hx   . Pseudochol deficiency Neg Hx    Social History   Socioeconomic History  . Marital status: Widowed    Spouse name: Not on file  . Number of children: Not on file  . Years of education: Not on file  . Highest education level: Not on file  Occupational History  .  Not on file  Social Needs  . Financial resource strain: Not hard at all  . Food insecurity:    Worry: Never true    Inability: Never true  . Transportation needs:    Medical: No    Non-medical: No  Tobacco Use  . Smoking status: Former Smoker    Types: Pipe    Last attempt to quit: 09/26/1988    Years since quitting: 29.7  . Smokeless tobacco: Never Used  . Tobacco comment: about 20 years  Substance and Sexual Activity  . Alcohol use: Yes    Comment: 2-3 drinks per week  . Drug use: No  .  Sexual activity: Not Currently  Lifestyle  . Physical activity:    Days per week: 4 days    Minutes per session: 30 min  . Stress: Not at all  Relationships  . Social connections:    Talks on phone: More than three times a week    Gets together: More than three times a week    Attends religious service: More than 4 times per year    Active member of club or organization: No    Attends meetings of clubs or organizations: Never    Relationship status: Widowed  Other Topics Concern  . Not on file  Social History Narrative   Patient is Married Software engineer). Retired Engineer, mining) Administrator, sports. Lives in apartment, Independent Living section at [facility] since 2000.  Spouse lives in skilled nursing section in same community   Smoking 1994 , moderate alcohol use: 2 drinks a night. Exercises regularly with walking and exercise classes. Continues to drive   Patient has Advanced planning documents: Living Will                Outpatient Encounter Medications as of 07/03/2018  Medication Sig  . acetaminophen (TYLENOL) 500 MG tablet Take 500 mg by mouth daily as needed for fever.   . Cholecalciferol (VITAMIN D) 2000 units tablet Take 2,000 Units by mouth daily.  . Coenzyme Q10 300 MG CAPS Take 300 mg by mouth daily.   Marland Kitchen docusate sodium (COLACE) 100 MG capsule Take 200 mg by mouth at bedtime.  . finasteride (PROSCAR) 5 MG tablet Take 5 mg by mouth daily.   .  Flaxseed, Linseed, (EQL FLAXSEED OIL) 1200 MG CAPS Take as directed once a day  . moxifloxacin (VIGAMOX) 0.5 % ophthalmic solution moxifloxacin 0.5 % eye drops  . Multiple Vitamins-Minerals (MULTIVITAMIN & MINERAL PO) Take 1 tablet by mouth daily.  . Omega-3 Fatty Acids (OMEGA-3 FISH OIL PO) Take 700 mg by mouth daily.  Vladimir Faster Glycol-Propyl Glycol (SYSTANE OP) Place 1 drop into both eyes daily as needed (dry eyes).  . pravastatin (PRAVACHOL) 80 MG tablet Take 80 mg by mouth every other day.   . predniSONE (DELTASONE) 5 MG tablet Take 2.5-5 mg by mouth See admin instructions. 5mg  in the morning and 2.5mg  in the evening  . SYNTHROID 88 MCG tablet Take 88 mcg by mouth daily before breakfast.   . testosterone cypionate (DEPOTESTOSTERONE CYPIONATE) 200 MG/ML injection Inject 30 mg (0.15 ml) intramuscularly weekly. Must discard vial after 90 days.  Marland Kitchen timolol (TIMOPTIC) 0.5 % ophthalmic solution Place 1 drop into the right eye daily.  . vitamin B-12 (CYANOCOBALAMIN) 500 MCG tablet Take 500 mcg by mouth daily.  . cefdinir (OMNICEF) 300 MG capsule cefdinir 300 mg capsule  TAKE 1 CAPSULE TWICE DAILY FOR 10 DAYS, DO NOT CRUSH  . cephALEXin (KEFLEX) 500 MG capsule cephalexin 500 mg capsule  . diclofenac sodium (VOLTAREN) 1 % GEL Apply 2 g topically 4 (four) times daily.  Marland Kitchen doxycycline (VIBRAMYCIN) 100 MG capsule Take 1 capsule (100 mg total) by mouth 2 (two) times daily.  . fluconazole (DIFLUCAN) 150 MG tablet   . [place] powder    Facility-Administered Encounter Medications as of 07/03/2018  Medication  . mupirocin cream (BACTROBAN) 2 %    Activities of Daily Living In your present state of health, do you have any difficulty performing the following activities: 07/03/2018 03/20/2018  Hearing? N -  [place]? N -  Difficulty concentrating or making  decisions? N -  Walking or climbing stairs? N -  Dressing or bathing? N -  Doing errands, shopping? N Y  Conservation officer, nature and eating ? N -    Using the Toilet? N -  In the past six months, have you accidently leaked urine? N -  Do you have problems with loss of bowel control? N -  Managing your Medications? N -  Managing your Finances? N -  Housekeeping or managing your Housekeeping? N -  Some recent data might be hidden    Patient Care Team: [name], DO as PCP - General (Geriatric Medicine) Community[name], [name], MD as Referring Physician (Endocrinology) [name], MD as Consulting Physician (Orthopedic Surgery)   Assessment:   This is a routine wellness examination for [facility].  Exercise Activities and Dietary recommendations Current Exercise Habits: Home exercise routine, Type of exercise: strength training/weights, Time (Minutes): 30, Frequency (Times/Week): 4, Weekly Exercise (Minutes/Week): 120, Intensity: Mild, Exercise limited by: orthopedic condition(s)  Goals   None     Fall Risk Fall Risk  07/03/2018 12/11/2017 11/01/2017 10/11/2017 07/18/2017  Falls in the past year? 0 Yes No Yes No  Number falls in past yr: 0 1 - 1 -  Comment - 1 slip in the bathroom, no injury - - -  Injury with Fall? 0 No - Yes -  Risk Factor Category  - - - - -  Risk for fall due to : - - - - -   Is the patient's home free of loose throw rugs in walkways, pet beds, electrical cords, etc?   yes      Grab bars in the bathroom? yes      Handrails on the stairs?   yes      Adequate lighting?   yes  Depression Screen PHQ 2/9 Scores 07/03/2018 12/11/2017 11/01/2017 10/11/2017  PHQ - 2 Score 0 0 0 0    Cognitive Function MMSE - Mini Mental State Exam 07/03/2018 06/20/2017  Orientation to time 5 5  Orientation to Place 5 5  Registration 3 3  Attention/ Calculation 5 5  Recall 3 2  Language- name 2 objects 2 2  Language- repeat 1 1  Language- follow 3 step command 3 3  Language- read & follow direction 1 1  Write a sentence 1 1  Copy design 1 1  Total score 30 29        Immunization  History  Administered Date(s) Administered  . Influenza, High Dose Seasonal PF 04/30/2017, 06/08/2018  . Influenza, Quadrivalent, Recombinant, Inj, Pf 05/19/2016  . Pneumococcal Conjugate-13 06/04/2015  . Pneumococcal Polysaccharide-23 06/29/2010  . Tdap 10/28/1998, 06/08/2018  . Zoster 10/27/2005  . Zoster Recombinat (Shingrix) 02/04/2018, 07/02/2018    Qualifies for Shingles? Up to date, completed  Screening Tests Health Maintenance  Topic Date Due  . FOOT EXAM  01/22/1933  . OPHTHALMOLOGY EXAM  01/22/1933  . URINE MICROALBUMIN  01/22/1933  . HEMOGLOBIN A1C  09/20/2018  . TETANUS/TDAP  06/08/2028  . INFLUENZA VACCINE  Completed  . PNA vac Low Risk Adult  Completed   Cancer Screenings: Lung: Low Dose CT Chest recommended if Age 23-80 years, 30 pack-year currently smoking OR have quit w/in 15years. Patient does not qualify. Colorectal: up to date  Additional Screenings:  Hepatitis C Screening:declined Gets eye exams annually at [facility]:    I have personally reviewed and addressed the Medicare Annual Wellness questionnaire and have  noted the following in the patient's chart:  A. Medical and social history B. Use of alcohol, tobacco or illicit drugs  C. Current medications and supplements D. Functional ability and status E.  Nutritional status F.  Physical activity G. Advance directives H. List of other physicians I.  Hospitalizations, surgeries, and ER visits in previous 12 months J.  [place] to include hearing, vision, cognitive, depression L. Referrals and appointments - none  In addition, I have reviewed and discussed with patient certain preventive protocols, quality metrics, and best practice recommendations. A written personalized care plan for preventive services as well as general preventive health recommendations were provided to patient.  See attached scanned questionnaire for additional information.   Signed,   Tyson Dense,  RN Nurse Health Advisor  Patient concerns: none

## 2018-07-03 NOTE — Patient Instructions (Addendum)
[name] , Thank you for taking time to come for your Medicare Wellness Visit. I appreciate your ongoing commitment to your health goals. Please review the following plan we discussed and let me know if I can assist you in the future.   Screening recommendations/referrals: Colonoscopy excluded, over age 82 Recommended yearly ophthalmology/optometry visit for glaucoma screening and checkup Recommended yearly dental visit for hygiene and checkup  Vaccinations: Influenza vaccine up to date Pneumococcal vaccine up to date, completed Tdap vaccine up to date, due 06/08/2028 Shingles vaccine up to date, completed  Advanced directives: In chart  Conditions/risks identified: none  Next appointment: Dr. Mariea Clonts 07/11/2018 @ 1:30pm  Preventive Care 75 Years and Older, Male Preventive care refers to lifestyle choices and visits with your health care provider that can promote health and wellness. What does preventive care include?  A yearly physical exam. This is also called an annual well check.  Dental exams once or twice a year.  Routine eye exams. Ask your health care provider how often you should have your eyes checked.  Personal lifestyle choices, including:  Daily care of your teeth and gums.  Regular physical activity.  Eating a healthy diet.  Avoiding tobacco and drug use.  Limiting alcohol use.  Practicing safe sex.  Taking low doses of aspirin every day.  Taking vitamin and mineral supplements as recommended by your health care provider. What happens during an annual well check? The services and screenings done by your health care provider during your annual well check will depend on your age, overall health, lifestyle risk factors, and family history of disease. Counseling  Your health care provider may ask you questions about your:  Alcohol use.  Tobacco use.  Drug use.  Emotional well-being.  Home and relationship well-being.  Sexual activity.  Eating  habits.  History of falls.  Memory and ability to understand (cognition).  Work and work Statistician. Screening  You may have the following tests or measurements:  Height, weight, and BMI.  Blood pressure.  Lipid and cholesterol levels. These may be checked every 5 years, or more frequently if you are over 55 years old.  Skin check.  Lung cancer screening. You may have this screening every year starting at age 57 if you have a 30-pack-year history of smoking and currently smoke or have quit within the past 15 years.  Fecal occult blood test (FOBT) of the stool. You may have this test every year starting at age 75.  Flexible sigmoidoscopy or colonoscopy. You may have a sigmoidoscopy every 5 years or a colonoscopy every 10 years starting at age 35.  Prostate cancer screening. Recommendations will vary depending on your family history and other risks.  Hepatitis C blood test.  Hepatitis B blood test.  Sexually transmitted disease (STD) testing.  Diabetes screening. This is done by checking your blood sugar (glucose) after you have not eaten for a while (fasting). You may have this done every 1-3 years.  Abdominal aortic aneurysm (AAA) screening. You may need this if you are a current or former smoker.  Osteoporosis. You may be screened starting at age 62 if you are at high risk. Talk with your health care provider about your test results, treatment options, and if necessary, the need for more tests. Vaccines  Your health care provider may recommend certain vaccines, such as:  Influenza vaccine. This is recommended every year.  Tetanus, diphtheria, and acellular pertussis (Tdap, Td) vaccine. You may need a Td booster every 10 years.  Zoster vaccine. You may need this after age 61.  Pneumococcal 13-valent conjugate (PCV13) vaccine. One dose is recommended after age 41.  Pneumococcal polysaccharide (PPSV23) vaccine. One dose is recommended after age 64. Talk to your health  care provider about which screenings and vaccines you need and how often you need them. This information is not intended to replace advice given to you by your health care provider. Make sure you discuss any questions you have with your health care provider. Document Released: 09/11/2015 Document Revised: 05/04/2016 Document Reviewed: 06/16/2015 Elsevier Interactive Patient Education  2017 [place] Prevention in the Home Falls can cause injuries. They can happen to people of all ages. There are many things you can do to make your home safe and to help prevent falls. What can I do on the outside of my home?  Regularly fix the edges of walkways and driveways and fix any cracks.  Remove anything that might make you trip as you walk through a door, such as a raised step or threshold.  Trim any bushes or trees on the path to your home.  Use bright outdoor lighting.  Clear any walking paths of anything that might make someone trip, such as rocks or tools.  Regularly check to see if handrails are loose or broken. Make sure that both sides of any steps have handrails.  Any raised decks and porches should have guardrails on the edges.  Have any leaves, snow, or ice cleared regularly.  Use sand or salt on walking paths during winter.  Clean up any spills in your garage right away. This includes oil or grease spills. What can I do in the bathroom?  Use night lights.  Install grab bars by the toilet and in the tub and shower. Do not use towel bars as grab bars.  Use non-skid mats or decals in the tub or shower.  If you need to sit down in the shower, use a plastic, non-slip stool.  Keep the floor dry. Clean up any water that spills on the floor as soon as it happens.  Remove soap buildup in the tub or shower regularly.  Attach bath mats securely with double-sided non-slip rug tape.  Do not have throw rugs and other things on the floor that can make you trip. What can I do  in the bedroom?  Use night lights.  Make sure that you have a light by your bed that is easy to reach.  Do not use any sheets or blankets that are too big for your bed. They should not hang down onto the floor.  Have a firm chair that has side arms. You can use this for support while you get dressed.  Do not have throw rugs and other things on the floor that can make you trip. What can I do in the kitchen?  Clean up any spills right away.  Avoid walking on wet floors.  Keep items that you use a lot in easy-to-reach places.  If you need to reach something above you, use a strong step stool that has a grab bar.  Keep electrical cords out of the way.  Do not use floor polish or wax that makes floors slippery. If you must use wax, use non-skid floor wax.  Do not have throw rugs and other things on the floor that can make you trip. What can I do with my stairs?  Do not leave any items on the stairs.  Make sure that there are  handrails on both sides of the stairs and use them. Fix handrails that are broken or loose. Make sure that handrails are as long as the stairways.  Check any carpeting to make sure that it is firmly attached to the stairs. Fix any carpet that is loose or worn.  Avoid having throw rugs at the top or bottom of the stairs. If you do have throw rugs, attach them to the floor with carpet tape.  Make sure that you have a light switch at the top of the stairs and the bottom of the stairs. If you do not have them, ask someone to add them for you. What else can I do to help prevent falls?  Wear shoes that:  Do not have high heels.  Have rubber bottoms.  Are comfortable and fit you well.  Are closed at the toe. Do not wear sandals.  If you use a stepladder:  Make sure that it is fully opened. Do not climb a closed stepladder.  Make sure that both sides of the stepladder are locked into place.  Ask someone to hold it for you, if possible.  Clearly mark  and make sure that you can see:  Any grab bars or handrails.  First and last steps.  Where the edge of each step is.  Use tools that help you move around (mobility aids) if they are needed. These include:  Canes.  Walkers.  Scooters.  Crutches.  Turn on the lights when you go into a dark area. Replace any light bulbs as soon as they burn out.  Set up your furniture so you have a clear path. Avoid moving your furniture around.  If any of your floors are uneven, fix them.  If there are any pets around you, be aware of where they are.  Review your medicines with your doctor. Some medicines can make you feel dizzy. This can increase your chance of falling. Ask your doctor what other things that you can do to help prevent falls. This information is not intended to replace advice given to you by your health care provider. Make sure you discuss any questions you have with your health care provider. Document Released: 06/11/2009 Document Revised: 01/21/2016 Document Reviewed: 09/19/2014 Elsevier Interactive Patient Education  2017 [employer].

## 2018-07-06 ENCOUNTER — Ambulatory Visit (INDEPENDENT_AMBULATORY_CARE_PROVIDER_SITE_OTHER): Payer: PPO | Admitting: Physical Medicine and Rehabilitation

## 2018-07-06 ENCOUNTER — Ambulatory Visit (INDEPENDENT_AMBULATORY_CARE_PROVIDER_SITE_OTHER): Payer: Self-pay

## 2018-07-06 ENCOUNTER — Encounter (INDEPENDENT_AMBULATORY_CARE_PROVIDER_SITE_OTHER): Payer: Self-pay | Admitting: Physical Medicine and Rehabilitation

## 2018-07-06 VITALS — BP 158/69 | HR 80 | Temp 98.0°F

## 2018-07-06 DIAGNOSIS — M47816 Spondylosis without myelopathy or radiculopathy, lumbar region: Secondary | ICD-10-CM

## 2018-07-06 DIAGNOSIS — M5416 Radiculopathy, lumbar region: Secondary | ICD-10-CM | POA: Diagnosis not present

## 2018-07-06 DIAGNOSIS — M48062 Spinal stenosis, lumbar region with neurogenic claudication: Secondary | ICD-10-CM

## 2018-07-06 DIAGNOSIS — M25552 Pain in left hip: Secondary | ICD-10-CM

## 2018-07-06 MED ORDER — BETAMETHASONE SOD PHOS & ACET 6 (3-3) MG/ML IJ SUSP: 12.0000 mg | Freq: Once | INTRAMUSCULAR | Status: DC

## 2018-07-06 NOTE — Patient Instructions (Signed)

## 2018-07-06 NOTE — Progress Notes (Signed)
 .  Numeric Pain Rating Scale and Functional Assessment Average Pain 5   In the last MONTH (on 0-10 scale) has pain interfered with the following?  1. General activity like being  able to carry out your everyday physical activities such as walking, climbing stairs, carrying groceries, or moving a chair?  Rating(4)   +Driver, -BT, -Dye Allergies.  

## 2018-07-11 ENCOUNTER — Non-Acute Institutional Stay: Payer: PPO | Admitting: Internal Medicine

## 2018-07-11 ENCOUNTER — Encounter: Payer: Self-pay | Admitting: Internal Medicine

## 2018-07-11 VITALS — BP 126/70 | HR 72 | Temp 98.1°F | Ht 71.0 in | Wt 179.0 lb

## 2018-07-11 DIAGNOSIS — R531 Weakness: Secondary | ICD-10-CM

## 2018-07-11 DIAGNOSIS — E2740 Unspecified adrenocortical insufficiency: Secondary | ICD-10-CM

## 2018-07-11 DIAGNOSIS — M5442 Lumbago with sciatica, left side: Secondary | ICD-10-CM | POA: Diagnosis not present

## 2018-07-11 DIAGNOSIS — E893 Postprocedural hypopituitarism: Secondary | ICD-10-CM

## 2018-07-11 DIAGNOSIS — G8929 Other chronic pain: Secondary | ICD-10-CM

## 2018-07-11 DIAGNOSIS — R2689 Other abnormalities of gait and mobility: Secondary | ICD-10-CM | POA: Insufficient documentation

## 2018-07-11 DIAGNOSIS — N183 Chronic kidney disease, stage 3 unspecified: Secondary | ICD-10-CM

## 2018-07-11 NOTE — Progress Notes (Signed)
Location:  Occupational psychologist of Service:  Clinic (12)  Provider: Mayah Urquidi L. Mariea Clonts, D.O., C.M.D.  Code Status: DNR Goals of Care:  Advanced Directives 07/03/2018  Does Patient Have a Medical Advance Directive? Yes  Type of Paramedic of [place];Out of facility DNR (pink MOST or yellow form)  Does patient want to make changes to medical advance directive? No - Patient declined  Copy of [place] in Chart? Yes  Would patient like information on creating a medical advance directive? -  Pre-existing out of facility DNR order (yellow form or pink MOST form) Yellow form placed in chart (order not valid for inpatient use);Pink MOST form placed in chart (order not valid for inpatient use)   Chief Complaint  Patient presents with  . Medical Management of Chronic Issues    4 month follow-up  . Medication Management    Discuss testoterone   . Health Maintenance    Pending eye exam 2-3 weeks, Dr.Bone     HPI: Patient is a 84 y.o. male seen today for medical management of chronic diseases.    Says he's doing alright except his damn left leg.  He had been to Dr. Nelva Bush. Now he's going to Dr. Ernestina Patches who gave him another epidural Friday.    Balance poor.  Is doing 40 squats every other day.  Some abduction and flexion exercises.  Balance comes and goes also.  Using his scooter long distances from his apt now due to the leg pain.    He thinks that when he was in the hospital, his prednisone was stopped temporarily.  He was then very weak and nauseous.  He says they then gave him insulin there and checked his sugar again and again.   He doesn't want his testosterone checked so often--just getting annually now.  He is also wanting to get refills on it.  Dr. Elyse Hsu is taking care of that.    He struggles to read.  Has his routine f/u with Dr. Edilia Bo coming up for his glaucoma. On timoptic drops.  Right eye is the problem.    Past  Medical History:  Diagnosis Date  . Allergic rhinitis    Prior allergy shots 20 years  . Anemia   . Anemia, B12 deficiency 2000  . Arthritis    Status post left total replacement 8 2011  . BPH (benign prostatic hyperplasia) 2013  . Cancer ([place])    renal cell ca and skin cancer   . Colitis 2014  . Difficult intubation 1994   surgery had to be  stopped due to injury to "throat"  . Difficult intubation 1994   no trouble since.  Required nasotracheal intubation  '02 [facility] / ANESTHESIA RECORD FROM 2013 AND 2016 IN EPIC  . GERD (gastroesophageal reflux disease)   . Glaucoma 2007   Status post left trabeculectomy 2007  . History of colon polyps    Colonoscopy 2001  . History of kidney stones   . History of renal cell carcinoma 1994   Status post right nephrectomy  . Hyperlipidemia 2003  . Hypertension   . 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 Hypothyroidism (acquired) 2000  . Nocturia   . Osteomyelitis ([place])    right great  . Osteomyelitis ([place])    RIGHT FOOT /TOE  . Pituitary mass ([place]) 2000   S/p transphenoidal excision 05/1999 ([facility])  . Vitamin D deficiency 2009  Past Surgical History:  Procedure Laterality Date  . AMPUTATION Right 12/20/2017   Procedure: RIGHT 1ST RAY AMPUTATION;  Surgeon: [name], MD;  Location: [facility];  Service: Orthopedics;  Laterality: Right;  . AMPUTATION Right 03/23/2018   Procedure: RIGHT 2ND TOE AMPUTATION;  Surgeon: [name], MD;  Location: [facility];  Service: Orthopedics;  Laterality: Right;  . BRAIN SURGERY  2000   pituatary gland removed .  Marland Kitchen CHOLECYSTECTOMY  1984  . Ectropion surgery Bilateral 2006  . EYE SURGERY  over last 6 yrs.     trabeculectomy...   . EYE SURGERY   cat ext ou  . JOINT REPLACEMENT  2011   knee left  . KNEE ARTHROSCOPY Right 02/06/2015   Procedure: RIGHT ARTHROSCOPY KNEE WITH LATERAL MENSICAL  DEBRIDEMENT;  Surgeon: Gaynelle Arabian, MD;  Location: [facility];   Service: Orthopedics;  Laterality: Right;  . Mohs procedure      for skin cancer on nose   . NEPHRECTOMY Right 1994   Renal cell  . REVERSE SHOULDER ARTHROPLASTY  12/15/2011   Procedure: REVERSE SHOULDER ARTHROPLASTY;  Surgeon: Marin Shutter, MD;  Location: [facility];  Service: Orthopedics;  Laterality: Right;  right total reverse shoulder  . TONSILLECTOMY    . TOTAL KNEE ARTHROPLASTY Right 06/29/2015   Procedure: TOTAL KNEE ARTHROPLASTY;  Surgeon: Gaynelle Arabian, MD;  Location: [facility];  Service: Orthopedics;  Laterality: Right;  . Transsphenoidal excision pituitary tumor  05/1999   A.Tommi Rumps, M.D.([facility])    Allergies  Allergen Reactions  . Adhesive [Tape] Other (See Comments)    CAUSES SKIN TEARS, prefers paper tape   . Percocet [Oxycodone-Acetaminophen] Other (See Comments)    UNSPECIFIED REACTION  Does not want to take  . Robaxin [Methocarbamol]     UNSPECIFIED REACTION   . Diovan [Valsartan] Rash    Outpatient Encounter Medications as of 07/11/2018  Medication Sig  . acetaminophen (TYLENOL) 500 MG tablet Take 500 mg by mouth daily as needed for fever.   . Cholecalciferol (VITAMIN D) 2000 units tablet Take 2,000 Units by mouth daily.  . Coenzyme Q10 300 MG CAPS Take 300 mg by mouth daily.   Marland Kitchen docusate sodium (COLACE) 100 MG capsule Take 200 mg by mouth at bedtime.  . finasteride (PROSCAR) 5 MG tablet Take 5 mg by mouth daily.   . Flaxseed, Linseed, (EQL FLAXSEED OIL) 1200 MG CAPS Take as directed once a day  . fluconazole (DIFLUCAN) 150 MG tablet   . Multiple Vitamins-Minerals (MULTIVITAMIN & MINERAL PO) Take 1 tablet by mouth daily.  . Omega-3 Fatty Acids (OMEGA-3 FISH OIL PO) Take 700 mg by mouth daily.  . pravastatin (PRAVACHOL) 80 MG tablet Take 80 mg by mouth every other day.   . predniSONE (DELTASONE) 5 MG tablet Take 2.5-5 mg by mouth See admin instructions. 5mg  in the morning and 2.5mg  in the evening  . SYNTHROID 88 MCG tablet Take 88 mcg by mouth daily before breakfast.    . testosterone cypionate (DEPOTESTOSTERONE CYPIONATE) 200 MG/ML injection Inject 30 mg (0.15 ml) intramuscularly weekly. Must discard vial after 90 days.  Marland Kitchen timolol (TIMOPTIC) 0.5 % ophthalmic solution Place 1 drop into the right eye daily.  . vitamin B-12 (CYANOCOBALAMIN) 500 MCG tablet Take 500 mcg by mouth daily.  . [DISCONTINUED] cefdinir (OMNICEF) 300 MG capsule cefdinir 300 mg capsule  TAKE 1 CAPSULE TWICE DAILY FOR 10 DAYS, DO NOT CRUSH  . [DISCONTINUED] cephALEXin (KEFLEX) 500 MG capsule cephalexin 500 mg capsule  . [  DISCONTINUED] diclofenac sodium (VOLTAREN) 1 % GEL Apply 2 g topically 4 (four) times daily.  . [DISCONTINUED] doxycycline (VIBRAMYCIN) 100 MG capsule Take 1 capsule (100 mg total) by mouth 2 (two) times daily. (Patient not taking: Reported on 07/11/2018)  . [DISCONTINUED] moxifloxacin (VIGAMOX) 0.5 % ophthalmic solution moxifloxacin 0.5 % eye drops  . [DISCONTINUED] NYAMYC powder   . [DISCONTINUED] Polyethyl Glycol-Propyl Glycol (SYSTANE OP) Place 1 drop into both eyes daily as needed (dry eyes).   Facility-Administered Encounter Medications as of 07/11/2018  Medication  . betamethasone acetate-betamethasone sodium phosphate (CELESTONE) injection 12 mg  . mupirocin cream (BACTROBAN) 2 %    Review of Systems:  Review of Systems  Constitutional: Negative for chills, fever and malaise/fatigue.  HENT: Negative for congestion.   Eyes:       Needs eye exam  Respiratory: Negative for shortness of breath.   Cardiovascular: Negative for chest pain, palpitations and leg swelling.  Gastrointestinal: Negative for abdominal pain, blood in stool, constipation, diarrhea and melena.  Genitourinary: Negative for dysuria.  Musculoskeletal: Positive for back pain.  Skin: Negative for itching and rash.  Neurological: Positive for sensory change.  Endo/Heme/Allergies: Bruises/bleeds easily.  Psychiatric/Behavioral: Negative for depression and memory loss. The patient is not  nervous/anxious and does not have insomnia.     Health Maintenance  Topic Date Due  . FOOT EXAM  01/22/1933  . OPHTHALMOLOGY EXAM  01/22/1933  . URINE MICROALBUMIN  01/22/1933  . HEMOGLOBIN A1C  09/20/2018  . TETANUS/TDAP  06/08/2028  . INFLUENZA VACCINE  Completed  . PNA vac Low Risk Adult  Completed    Physical Exam: Vitals:   07/11/18 1339  BP: 126/70  Pulse: 72  Temp: 98.1 F (36.7 C)  TempSrc: Oral  SpO2: 98%  Weight: 179 lb (81.2 kg)  Height: 5\' 11"  (1.803 m)   Body mass index is 24.97 kg/m. Physical Exam  Constitutional: He is oriented to person, place, and time. He appears well-developed. No distress.  HENT:  Head: Normocephalic and atraumatic.  Eyes:  glasses  Cardiovascular: Normal rate, regular rhythm, normal heart sounds and intact distal pulses.  Pulmonary/Chest: Effort normal and breath sounds normal. No respiratory distress.  Abdominal: Bowel sounds are normal.  Musculoskeletal: Normal range of motion.  Walks short distances unsteadily with cane, uses scooter otherwise  Neurological: He is alert and oriented to person, place, and time.  Skin: Skin is warm and dry.  Senile purpura present (and steroid induced)  Psychiatric: He has a normal mood and affect.    Labs reviewed: Basic Metabolic Panel: Recent Labs    03/20/18 1343 03/20/18 1602  03/22/18 0339 03/23/18 0400 03/24/18 0709  NA 135  --    < > 137 139 136  K 4.1  --    < > 4.2 3.9 3.7  CL 100  --    < > 101 104 99  CO2 26  --    < > 28 27 29   GLUCOSE 85  --    < > 111* 108* 92  BUN 16  --    < > 13 17 15   CREATININE 1.28*  --    < > 1.55* 1.46* 1.54*  CALCIUM 9.4  --    < > 9.2 9.0 8.8*  MG 2.0  --   --   --   --   --   TSH  --  <0.010*  --   --   --   --    < > =  values in this interval not displayed.   Liver Function Tests: Recent Labs    11/29/17 1210 03/20/18 1343 03/21/18 0512  AST 15 17 15   ALT 13* 15 14  ALKPHOS 51 49 43  BILITOT 1.3* 0.8 1.0  PROT 5.5* 6.5 5.3*    ALBUMIN 2.7* 3.2* 2.4*   No results for input(s): LIPASE, AMYLASE in the last 8760 hours. No results for input(s): AMMONIA in the last 8760 hours. CBC: Recent Labs    11/17/17 2326  11/29/17 1210  03/22/18 0339 03/23/18 0400 03/24/18 0709  WBC 14.7*   < > 30.5*   < > 12.3* 11.6* 15.0*  NEUTROABS 11.7*  --  26.5*  --   --   --   --   HGB 10.9*   < > 10.9*   < > 12.1* 11.5* 11.7*  HCT 33.2*   < > 32.8*   < > 37.0* 35.9* 36.2*  MCV 97.1   < > 98.8   < > 98.7 98.6 98.6  PLT 270   < > 227   < > 267 286 277   < > = values in this interval not displayed.   Lipid Panel: No results for input(s): CHOL, HDL, LDLCALC, TRIG, CHOLHDL, LDLDIRECT in the last 8760 hours. Lab Results  Component Value Date   HGBA1C 6.5 (H) 03/20/2018    Procedures since last visit: Xr C-arm No Report  Result Date: 07/06/2018 Please see Notes tab for imaging impression.  Xr C-arm No Report  Result Date: 06/22/2018 Please see Notes tab for imaging impression.   Assessment/Plan 1. Adrenal insufficiency ([place]) -ongoing, continue hormone repletion -pt cannot be w/o his steroids due to this  2. CKD (chronic kidney disease) stage 3, GFR 30-59 ml/min ([facility]) -notable, Avoid nephrotoxic agents like nsaids, dose adjust renally excreted meds, hydrate.  3. 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 Elyse Hsu  4. Chronic left-sided low back pain with left-sided sciatica -his only bothersome issue that persists, but did improve considerably with a recent epidural  5. Weakness -ongoing, use of cane short distances, strength exercises  6. Balance problem -wants to pursue exercises in this realm "when I get stronger"  Labs/tests ordered:  No new Next appt:  11/14/2018   Francis Doenges L. Maicy Filip, D.O. [facility] [address], [place] [address] Cell Phone (Mon-Fri 8am-5pm):   [phone] On Call:  [phone] & follow prompts after 5pm & weekends Office Phone:  [phone] Office Fax:  [phone]

## 2018-07-25 NOTE — Progress Notes (Signed)
[name] - 84 y.o. male MRN [id number]  Date of birth: [birth year]  Office Visit Note: Visit Date: 07/06/2018 PCP: [name], DO Referred by: [name], DO  Subjective: Chief Complaint  Patient presents with  . Left Leg - Pain  . Left Knee - Pain   HPI:  Abigail Marsiglia. is a 84 y.o. male who comes in today For what we had planned to be a facet joint block or medial branch block at L5-S1.  This would been a second block and double block paradigm.  Patient is having left low back and hip and leg pain but his leg pain is really the more problematic issue at this point.  The last injection was an L5 transforaminal injection.  We have completed 2 transforaminal epidural steroid injections at the L5 position and is really gotten significant relief compared to where he started.  He still gets leg pain which is a little bit below the left knee and into the calf and he gets some in the lower back.  He does not shoot all the way down the leg anymore and sort of skips but is still in a classic L5 distribution.  He does feel like he is gotten better and he is more mobile.  It worse with standing for prolonged periods.  He has a known history of lumbar stenosis multifactorial L4-5 but with really L5 radicular pain on clinical evaluation.  Has facet joint arthritis at L5-S1 with small cyst likely impacting the L5 nerve root.  L5 injections have helped and prior injections by another practitioner did not help at the L4 level.  Today because of his leg pain with finish basically a series that is gotten there because he is gotten better of an L5 injection transforaminal he.  He is doing well otherwise.  Still would entertain the idea of completing the medial branch block for back pain if it was more persistent and looking at radiofrequency ablation.  Patient does ambulate with a walker.  ROS Otherwise per HPI.  Assessment & Plan: Visit Diagnoses:  1. Lumbar radiculopathy   2. Spinal  stenosis of lumbar region with neurogenic claudication   3. Spondylosis without myelopathy or radiculopathy, lumbar region   4. Pain in left hip     Plan: No additional findings.   Meds & Orders:  Meds ordered this encounter  Medications  . betamethasone acetate-betamethasone sodium phosphate (CELESTONE) injection 12 mg    Orders Placed This Encounter  Procedures  . XR C-ARM NO REPORT  . Epidural Steroid injection    Follow-up: Return if symptoms worsen or fail to improve.   Procedures: No procedures performed  Lumbosacral Transforaminal Epidural Steroid Injection - Sub-Pedicular Approach with Fluoroscopic Guidance  Patient: Tyde Lamison.      Date of Birth: Jul 20, 1923 MRN: [id number] PCP: [name], DO      Visit Date: 07/06/2018   Universal Protocol:    Date/Time: 07/06/2018  Consent Given By: the patient  Position: PRONE  Additional Comments: Vital signs were monitored before and after the procedure. Patient was prepped and draped in the usual sterile fashion. The correct patient, procedure, and site was verified.   Injection Procedure Details:  Procedure Site One Meds Administered:  Meds ordered this encounter  Medications  . betamethasone acetate-betamethasone sodium phosphate (CELESTONE) injection 12 mg    Laterality: Left  Location/Site:  L5-S1  Needle size: 22 G  Needle type: Spinal  Needle Placement:  Transforaminal  Findings:    -Comments: Excellent flow of contrast along the nerve and into the epidural space.  Procedure Details: After squaring off the end-plates to get a true AP view, the C-arm was positioned so that an oblique view of the foramen as noted above was visualized. The target area is just inferior to the "nose of the scotty dog" or sub pedicular. The soft tissues overlying this structure were infiltrated with 2-3 ml. of 1% Lidocaine without Epinephrine.  The spinal needle was inserted toward the target using a  "trajectory" view along the fluoroscope beam.  Under AP and lateral visualization, the needle was advanced so it did not puncture dura and was located close the 6 O'Clock position of the pedical in AP tracterory. Biplanar projections were used to confirm position. Aspiration was confirmed to be negative for CSF and/or blood. A 1-2 ml. volume of Isovue-250 was injected and flow of contrast was noted at each level. Radiographs were obtained for documentation purposes.   After attaining the desired flow of contrast documented above, a 0.5 to 1.0 ml test dose of 0.25% Marcaine was injected into each respective transforaminal space.  The patient was observed for 90 seconds post injection.  After no sensory deficits were reported, and normal lower extremity motor function was noted,   the above injectate was administered so that equal amounts of the injectate were placed at each foramen (level) into the transforaminal epidural space.   Additional Comments:  The patient tolerated the procedure well Dressing: Band-Aid    Post-procedure details: Patient was observed during the procedure. Post-procedure instructions were reviewed.  Patient left the clinic in stable condition.    Clinical History: Lumbar spine MRI dated 2018 is completed at [facility].  At L4-5 there is moderate multifactorial stenosis with foraminal narrowing and disc protrusion on the left.  At L5-S1 there is facet arthropathy which is pretty significant bilaterally with facet joint cyst on the left.     Objective:  VS:  HT:    WT:   BMI:     BP:(!) 158/69  HR:80bpm  TEMP:98 F (36.7 C)(Oral)  RESP:  Physical Exam  Ortho Exam Imaging: No results found.

## 2018-07-25 NOTE — Procedures (Signed)
Lumbosacral Transforaminal Epidural Steroid Injection - Sub-Pedicular Approach with Fluoroscopic Guidance  Patient: [name] [name].      Date of Birth: 1923-04-29 MRN: [id number] PCP: [name], DO      Visit Date: 07/06/2018   Universal Protocol:    Date/Time: 07/06/2018  Consent Given By: the patient  Position: PRONE  Additional Comments: Vital signs were monitored before and after the procedure. Patient was prepped and draped in the usual sterile fashion. The correct patient, procedure, and site was verified.   Injection Procedure Details:  Procedure Site One Meds Administered:  Meds ordered this encounter  Medications  . betamethasone acetate-betamethasone sodium phosphate (CELESTONE) injection 12 mg    Laterality: Left  Location/Site:  L5-S1  Needle size: 22 G  Needle type: Spinal  Needle Placement: Transforaminal  Findings:    -Comments: Excellent flow of contrast along the nerve and into the epidural space.  Procedure Details: After squaring off the end-plates to get a true AP view, the C-arm was positioned so that an oblique view of the foramen as noted above was visualized. The target area is just inferior to the "nose of the scotty dog" or sub pedicular. The soft tissues overlying this structure were infiltrated with 2-3 ml. of 1% Lidocaine without Epinephrine.  The spinal needle was inserted toward the target using a "trajectory" view along the fluoroscope beam.  Under AP and lateral visualization, the needle was advanced so it did not puncture dura and was located close the 6 O'Clock position of the pedical in AP tracterory. Biplanar projections were used to confirm position. Aspiration was confirmed to be negative for CSF and/or blood. A 1-2 ml. volume of Isovue-250 was injected and flow of contrast was noted at each level. Radiographs were obtained for documentation purposes.   After attaining the desired flow of contrast documented above, a 0.5  to 1.0 ml test dose of 0.25% Marcaine was injected into each respective transforaminal space.  The patient was observed for 90 seconds post injection.  After no sensory deficits were reported, and normal lower extremity motor function was noted,   the above injectate was administered so that equal amounts of the injectate were placed at each foramen (level) into the transforaminal epidural space.   Additional Comments:  The patient tolerated the procedure well Dressing: Band-Aid    Post-procedure details: Patient was observed during the procedure. Post-procedure instructions were reviewed.  Patient left the clinic in stable condition.

## 2018-07-30 DIAGNOSIS — H401133 Primary open-angle glaucoma, bilateral, severe stage: Secondary | ICD-10-CM | POA: Diagnosis not present

## 2018-07-30 DIAGNOSIS — H00015 Hordeolum externum left lower eyelid: Secondary | ICD-10-CM | POA: Diagnosis not present

## 2018-07-31 DIAGNOSIS — R351 Nocturia: Secondary | ICD-10-CM | POA: Diagnosis not present

## 2018-07-31 DIAGNOSIS — E291 Testicular hypofunction: Secondary | ICD-10-CM | POA: Diagnosis not present

## 2018-08-06 ENCOUNTER — Telehealth (INDEPENDENT_AMBULATORY_CARE_PROVIDER_SITE_OTHER): Payer: Self-pay | Admitting: Physical Medicine and Rehabilitation

## 2018-08-06 NOTE — Telephone Encounter (Signed)
I do not think the injections are to make it completely go away.  We talked about this at the time I saw him.  We could look at trying different medication we can do an office visit with him other avenues would be regrouping with a physical therapist at [place].

## 2018-08-07 NOTE — Telephone Encounter (Signed)
Scheduled for 08/30/18 at 1030 for an ov.

## 2018-08-10 DIAGNOSIS — L570 Actinic keratosis: Secondary | ICD-10-CM | POA: Diagnosis not present

## 2018-08-10 DIAGNOSIS — Z85828 Personal history of other malignant neoplasm of skin: Secondary | ICD-10-CM | POA: Diagnosis not present

## 2018-08-24 NOTE — Progress Notes (Signed)
This encounter was created in error - please disregard.

## 2018-08-30 ENCOUNTER — Ambulatory Visit (INDEPENDENT_AMBULATORY_CARE_PROVIDER_SITE_OTHER): Payer: PPO | Admitting: Physical Medicine and Rehabilitation

## 2018-08-30 ENCOUNTER — Encounter (INDEPENDENT_AMBULATORY_CARE_PROVIDER_SITE_OTHER): Payer: Self-pay | Admitting: Physical Medicine and Rehabilitation

## 2018-08-30 VITALS — BP 142/77 | HR 59 | Ht 71.0 in | Wt 174.0 lb

## 2018-08-30 DIAGNOSIS — M79605 Pain in left leg: Secondary | ICD-10-CM | POA: Diagnosis not present

## 2018-08-30 DIAGNOSIS — M48062 Spinal stenosis, lumbar region with neurogenic claudication: Secondary | ICD-10-CM | POA: Diagnosis not present

## 2018-08-30 DIAGNOSIS — M9963 Osseous and subluxation stenosis of intervertebral foramina of lumbar region: Secondary | ICD-10-CM

## 2018-08-30 DIAGNOSIS — M79652 Pain in left thigh: Secondary | ICD-10-CM

## 2018-08-30 DIAGNOSIS — G8929 Other chronic pain: Secondary | ICD-10-CM

## 2018-08-30 DIAGNOSIS — M5416 Radiculopathy, lumbar region: Secondary | ICD-10-CM

## 2018-08-30 DIAGNOSIS — M48061 Spinal stenosis, lumbar region without neurogenic claudication: Secondary | ICD-10-CM

## 2018-08-30 NOTE — Progress Notes (Signed)
 .  Numeric Pain Rating Scale and Functional Assessment Average Pain 8 Pain Right Now 0 My pain is intermittent, dull and aching Pain is worse with: walking and standing Pain improves with: rest   In the last MONTH (on 0-10 scale) has pain interfered with the following?  1. General activity like being  able to carry out your everyday physical activities such as walking, climbing stairs, carrying groceries, or moving a chair?  Rating(5)  2. Relation with others like being able to carry out your usual social activities and roles such as  activities at home, at work and in your community. Rating(5)  3. Enjoyment of life such that you have  been bothered by emotional problems such as feeling anxious, depressed or irritable?  Rating(0)

## 2018-09-13 ENCOUNTER — Ambulatory Visit (INDEPENDENT_AMBULATORY_CARE_PROVIDER_SITE_OTHER): Payer: Self-pay

## 2018-09-13 ENCOUNTER — Ambulatory Visit (INDEPENDENT_AMBULATORY_CARE_PROVIDER_SITE_OTHER): Payer: PPO | Admitting: Physical Medicine and Rehabilitation

## 2018-09-13 ENCOUNTER — Encounter (INDEPENDENT_AMBULATORY_CARE_PROVIDER_SITE_OTHER): Payer: Self-pay | Admitting: Physical Medicine and Rehabilitation

## 2018-09-13 VITALS — BP 158/76 | HR 79 | Temp 98.1°F

## 2018-09-13 DIAGNOSIS — M5416 Radiculopathy, lumbar region: Secondary | ICD-10-CM

## 2018-09-13 DIAGNOSIS — M48062 Spinal stenosis, lumbar region with neurogenic claudication: Secondary | ICD-10-CM

## 2018-09-13 MED ORDER — BETAMETHASONE SOD PHOS & ACET 6 (3-3) MG/ML IJ SUSP
12.0000 mg | Freq: Once | INTRAMUSCULAR | Status: AC
  Administered 2018-09-13: 12 mg

## 2018-09-13 NOTE — Patient Instructions (Signed)

## 2018-09-13 NOTE — Progress Notes (Signed)
 .  Numeric Pain Rating Scale and Functional Assessment Average Pain 7   In the last MONTH (on 0-10 scale) has pain interfered with the following?  1. General activity like being  able to carry out your everyday physical activities such as walking, climbing stairs, carrying groceries, or moving a chair?  Rating(4)   +Driver, -BT, -Dye Allergies.  

## 2018-09-13 NOTE — Procedures (Signed)
Lumbosacral Transforaminal Epidural Steroid Injection - Sub-Pedicular Approach with Fluoroscopic Guidance  Patient: [name] [name].      Date of Birth: 10/10/1922 MRN: [id number] PCP: [name], DO      Visit Date: 09/13/2018   Universal Protocol:    Date/Time: 09/13/2018  Consent Given By: the patient  Position: PRONE  Additional Comments: Vital signs were monitored before and after the procedure. Patient was prepped and draped in the usual sterile fashion. The correct patient, procedure, and site was verified.   Injection Procedure Details:  Procedure Site One Meds Administered:  Meds ordered this encounter  Medications  . betamethasone acetate-betamethasone sodium phosphate (CELESTONE) injection 12 mg    Laterality: Left  Location/Site:  L4-L5 L5-S1  Needle size: 22 G  Needle type: Spinal  Needle Placement: Transforaminal  Findings:    -Comments: Excellent flow of contrast along the nerve and into the epidural space.  Procedure Details: After squaring off the end-plates to get a true AP view, the C-arm was positioned so that an oblique view of the foramen as noted above was visualized. The target area is just inferior to the "nose of the scotty dog" or sub pedicular. The soft tissues overlying this structure were infiltrated with 2-3 ml. of 1% Lidocaine without Epinephrine.  The spinal needle was inserted toward the target using a "trajectory" view along the fluoroscope beam.  Under AP and lateral visualization, the needle was advanced so it did not puncture dura and was located close the 6 O'Clock position of the pedical in AP tracterory. Biplanar projections were used to confirm position. Aspiration was confirmed to be negative for CSF and/or blood. A 1-2 ml. volume of Isovue-250 was injected and flow of contrast was noted at each level. Radiographs were obtained for documentation purposes.   After attaining the desired flow of contrast documented above,  a 0.5 to 1.0 ml test dose of 0.25% Marcaine was injected into each respective transforaminal space.  The patient was observed for 90 seconds post injection.  After no sensory deficits were reported, and normal lower extremity motor function was noted,   the above injectate was administered so that equal amounts of the injectate were placed at each foramen (level) into the transforaminal epidural space.   Additional Comments:  The patient tolerated the procedure well Dressing: Band-Aid    Post-procedure details: Patient was observed during the procedure. Post-procedure instructions were reviewed.  Patient left the clinic in stable condition.

## 2018-09-14 DIAGNOSIS — M5442 Lumbago with sciatica, left side: Secondary | ICD-10-CM | POA: Diagnosis not present

## 2018-09-14 DIAGNOSIS — E1142 Type 2 diabetes mellitus with diabetic polyneuropathy: Secondary | ICD-10-CM | POA: Diagnosis not present

## 2018-09-14 DIAGNOSIS — M545 Low back pain: Secondary | ICD-10-CM | POA: Diagnosis not present

## 2018-09-14 DIAGNOSIS — R293 Abnormal posture: Secondary | ICD-10-CM | POA: Diagnosis not present

## 2018-09-14 DIAGNOSIS — R2689 Other abnormalities of gait and mobility: Secondary | ICD-10-CM | POA: Diagnosis not present

## 2018-09-14 DIAGNOSIS — M5126 Other intervertebral disc displacement, lumbar region: Secondary | ICD-10-CM | POA: Diagnosis not present

## 2018-09-14 DIAGNOSIS — M4726 Other spondylosis with radiculopathy, lumbar region: Secondary | ICD-10-CM | POA: Diagnosis not present

## 2018-09-18 DIAGNOSIS — M545 Low back pain: Secondary | ICD-10-CM | POA: Diagnosis not present

## 2018-09-18 DIAGNOSIS — M5126 Other intervertebral disc displacement, lumbar region: Secondary | ICD-10-CM | POA: Diagnosis not present

## 2018-09-18 DIAGNOSIS — E1142 Type 2 diabetes mellitus with diabetic polyneuropathy: Secondary | ICD-10-CM | POA: Diagnosis not present

## 2018-09-18 DIAGNOSIS — R2689 Other abnormalities of gait and mobility: Secondary | ICD-10-CM | POA: Diagnosis not present

## 2018-09-18 DIAGNOSIS — M4726 Other spondylosis with radiculopathy, lumbar region: Secondary | ICD-10-CM | POA: Diagnosis not present

## 2018-09-18 DIAGNOSIS — R293 Abnormal posture: Secondary | ICD-10-CM | POA: Diagnosis not present

## 2018-09-18 DIAGNOSIS — M5442 Lumbago with sciatica, left side: Secondary | ICD-10-CM | POA: Diagnosis not present

## 2018-09-21 DIAGNOSIS — M545 Low back pain: Secondary | ICD-10-CM | POA: Diagnosis not present

## 2018-09-21 DIAGNOSIS — M5442 Lumbago with sciatica, left side: Secondary | ICD-10-CM | POA: Diagnosis not present

## 2018-09-21 DIAGNOSIS — R293 Abnormal posture: Secondary | ICD-10-CM | POA: Diagnosis not present

## 2018-09-21 DIAGNOSIS — M5126 Other intervertebral disc displacement, lumbar region: Secondary | ICD-10-CM | POA: Diagnosis not present

## 2018-09-21 DIAGNOSIS — E1142 Type 2 diabetes mellitus with diabetic polyneuropathy: Secondary | ICD-10-CM | POA: Diagnosis not present

## 2018-09-21 DIAGNOSIS — M4726 Other spondylosis with radiculopathy, lumbar region: Secondary | ICD-10-CM | POA: Diagnosis not present

## 2018-09-21 DIAGNOSIS — R2689 Other abnormalities of gait and mobility: Secondary | ICD-10-CM | POA: Diagnosis not present

## 2018-09-21 NOTE — Progress Notes (Signed)
[name] [name]. - 85 y.o. male MRN [id number]  Date of birth: [birth year]  Office Visit Note: Visit Date: 09/13/2018 PCP: [name], DO Referred by: [name], DO  Subjective: No chief complaint on file.  HPI:  [name] [name]. is a 85 y.o. male who comes in today For planned left L4 and L5 transforaminal epidural steroid injection.  Please see our prior evaluation and management note for further details and justification.  Continues to have radicular pain more of an L5 distribution lateral side of the left leg that stops mid calf.  Injection at L5 has helped the pain started back a couple weeks ago.  He has some back pain from facet arthropathy but that is much better.  ROS Otherwise per HPI.  Assessment & Plan: Visit Diagnoses:  1. Lumbar radiculopathy   2. Spinal stenosis of lumbar region with neurogenic claudication     Plan: No additional findings.   Meds & Orders:  Meds ordered this encounter  Medications  . betamethasone acetate-betamethasone sodium phosphate (CELESTONE) injection 12 mg    Orders Placed This Encounter  Procedures  . XR C-ARM NO REPORT  . Epidural Steroid injection    Follow-up: Return in about 4 weeks (around 10/11/2018).   Procedures: No procedures performed  Lumbosacral Transforaminal Epidural Steroid Injection - Sub-Pedicular Approach with Fluoroscopic Guidance  Patient: [name] [name].      Date of Birth: [birth year] MRN: [id number] PCP: [name], DO      Visit Date: 09/13/2018   Universal Protocol:    Date/Time: 09/13/2018  Consent Given By: the patient  Position: PRONE  Additional Comments: Vital signs were monitored before and after the procedure. Patient was prepped and draped in the usual sterile fashion. The correct patient, procedure, and site was verified.   Injection Procedure Details:  Procedure Site One Meds Administered:  Meds ordered this encounter  Medications  . betamethasone  acetate-betamethasone sodium phosphate (CELESTONE) injection 12 mg    Laterality: Left  Location/Site:  L4-L5 L5-S1  Needle size: 22 G  Needle type: Spinal  Needle Placement: Transforaminal  Findings:    -Comments: Excellent flow of contrast along the nerve and into the epidural space.  Procedure Details: After squaring off the end-plates to get a true AP view, the C-arm was positioned so that an oblique view of the foramen as noted above was visualized. The target area is just inferior to the "nose of the scotty dog" or sub pedicular. The soft tissues overlying this structure were infiltrated with 2-3 ml. of 1% Lidocaine without Epinephrine.  The spinal needle was inserted toward the target using a "trajectory" view along the fluoroscope beam.  Under AP and lateral visualization, the needle was advanced so it did not puncture dura and was located close the 6 O'Clock position of the pedical in AP tracterory. Biplanar projections were used to confirm position. Aspiration was confirmed to be negative for CSF and/or blood. A 1-2 ml. volume of Isovue-250 was injected and flow of contrast was noted at each level. Radiographs were obtained for documentation purposes.   After attaining the desired flow of contrast documented above, a 0.5 to 1.0 ml test dose of 0.25% Marcaine was injected into each respective transforaminal space.  The patient was observed for 90 seconds post injection.  After no sensory deficits were reported, and normal lower extremity motor function was noted,   the above injectate was administered so that equal amounts of the injectate  were placed at each foramen (level) into the transforaminal epidural space.   Additional Comments:  The patient tolerated the procedure well Dressing: Band-Aid    Post-procedure details: Patient was observed during the procedure. Post-procedure instructions were reviewed.  Patient left the clinic in stable condition.    Clinical  History: Lumbar spine MRI dated 2018 is completed at [facility].  At L4-5 there is moderate multifactorial stenosis with foraminal narrowing and disc protrusion on the left.  At L5-S1 there is facet arthropathy which is pretty significant bilaterally with facet joint cyst on the left.     Objective:  VS:  HT:    WT:   BMI:     BP:(!) 158/76  HR:79bpm  TEMP:98.1 F (36.7 C)(Oral)  RESP:  Physical Exam  Ortho Exam Imaging: No results found.

## 2018-09-24 DIAGNOSIS — H00015 Hordeolum externum left lower eyelid: Secondary | ICD-10-CM | POA: Diagnosis not present

## 2018-09-24 DIAGNOSIS — H16213 Exposure keratoconjunctivitis, bilateral: Secondary | ICD-10-CM | POA: Diagnosis not present

## 2018-09-24 DIAGNOSIS — H02889 Meibomian gland dysfunction of unspecified eye, unspecified eyelid: Secondary | ICD-10-CM | POA: Diagnosis not present

## 2018-09-24 DIAGNOSIS — L718 Other rosacea: Secondary | ICD-10-CM | POA: Diagnosis not present

## 2018-09-24 DIAGNOSIS — H401133 Primary open-angle glaucoma, bilateral, severe stage: Secondary | ICD-10-CM | POA: Diagnosis not present

## 2018-09-27 DIAGNOSIS — E1142 Type 2 diabetes mellitus with diabetic polyneuropathy: Secondary | ICD-10-CM | POA: Diagnosis not present

## 2018-09-27 DIAGNOSIS — M4726 Other spondylosis with radiculopathy, lumbar region: Secondary | ICD-10-CM | POA: Diagnosis not present

## 2018-09-27 DIAGNOSIS — M5442 Lumbago with sciatica, left side: Secondary | ICD-10-CM | POA: Diagnosis not present

## 2018-09-27 DIAGNOSIS — R293 Abnormal posture: Secondary | ICD-10-CM | POA: Diagnosis not present

## 2018-09-27 DIAGNOSIS — M545 Low back pain: Secondary | ICD-10-CM | POA: Diagnosis not present

## 2018-09-27 DIAGNOSIS — M5126 Other intervertebral disc displacement, lumbar region: Secondary | ICD-10-CM | POA: Diagnosis not present

## 2018-09-27 DIAGNOSIS — R2689 Other abnormalities of gait and mobility: Secondary | ICD-10-CM | POA: Diagnosis not present

## 2018-09-28 ENCOUNTER — Telehealth (INDEPENDENT_AMBULATORY_CARE_PROVIDER_SITE_OTHER): Payer: Self-pay | Admitting: Physical Medicine and Rehabilitation

## 2018-09-28 ENCOUNTER — Other Ambulatory Visit (INDEPENDENT_AMBULATORY_CARE_PROVIDER_SITE_OTHER): Payer: Self-pay | Admitting: Physical Medicine and Rehabilitation

## 2018-09-28 DIAGNOSIS — M792 Neuralgia and neuritis, unspecified: Secondary | ICD-10-CM

## 2018-09-28 MED ORDER — PREGABALIN 50 MG PO CAPS: ORAL_CAPSULE | ORAL | 0 refills | Status: DC

## 2018-09-28 NOTE — Telephone Encounter (Signed)
I sent to his pharmacy Lyrica 50 mg to be taken at night for 7 days and then we will go to twice a day.  We will see him at the follow-up appointment time to review how the medicine is working if he has questions he can call us.  The medication may make him a little bit sleepy at night but this is usually not an issue.  It does not interact with any of his other medications.  This should help with nerve pain but it may not help for a few days.

## 2018-09-28 NOTE — Telephone Encounter (Signed)
Called patient an left message to advise.

## 2018-09-28 NOTE — Progress Notes (Signed)
We will start Lyrica at 50 mg at night for 7 nights and then taper up.  I have a follow-up appointment for early February.

## 2018-10-04 DIAGNOSIS — H01004 Unspecified blepharitis left upper eyelid: Secondary | ICD-10-CM | POA: Diagnosis not present

## 2018-10-04 DIAGNOSIS — H01001 Unspecified blepharitis right upper eyelid: Secondary | ICD-10-CM | POA: Diagnosis not present

## 2018-10-05 ENCOUNTER — Ambulatory Visit (INDEPENDENT_AMBULATORY_CARE_PROVIDER_SITE_OTHER): Payer: PPO | Admitting: Physical Medicine and Rehabilitation

## 2018-10-05 ENCOUNTER — Encounter (INDEPENDENT_AMBULATORY_CARE_PROVIDER_SITE_OTHER): Payer: Self-pay | Admitting: Physical Medicine and Rehabilitation

## 2018-10-05 VITALS — BP 141/64 | HR 68 | Ht 71.5 in | Wt 170.0 lb

## 2018-10-05 DIAGNOSIS — M7138 Other bursal cyst, other site: Secondary | ICD-10-CM

## 2018-10-05 DIAGNOSIS — M5416 Radiculopathy, lumbar region: Secondary | ICD-10-CM

## 2018-10-05 DIAGNOSIS — M48062 Spinal stenosis, lumbar region with neurogenic claudication: Secondary | ICD-10-CM | POA: Diagnosis not present

## 2018-10-05 NOTE — Progress Notes (Signed)
 .  Numeric Pain Rating Scale and Functional Assessment Average Pain 6 Pain Right Now 4 My pain is intermittent, dull and aching Pain is worse with: walking and standing Pain improves with: rest   In the last MONTH (on 0-10 scale) has pain interfered with the following?  1. General activity like being  able to carry out your everyday physical activities such as walking, climbing stairs, carrying groceries, or moving a chair?  Rating(5)  2. Relation with others like being able to carry out your usual social activities and roles such as  activities at home, at work and in your community. Rating(5)  3. Enjoyment of life such that you have  been bothered by emotional problems such as feeling anxious, depressed or irritable?  Rating(3)

## 2018-10-15 DIAGNOSIS — E230 Hypopituitarism: Secondary | ICD-10-CM | POA: Diagnosis not present

## 2018-10-15 LAB — CBC AND DIFFERENTIAL
HCT: 38 — AB (ref 41–53)
HEMOGLOBIN: 12.7 — AB (ref 13.5–17.5)
PLATELETS: 243 (ref 150–399)
WBC: 9

## 2018-10-15 LAB — BASIC METABOLIC PANEL
BUN: 24 — AB (ref 4–21)
Creatinine: 1.2 (ref 0.6–1.3)
GLUCOSE: 105
Potassium: 4 (ref 3.4–5.3)
SODIUM: 140 (ref 137–147)

## 2018-10-15 LAB — HEPATIC FUNCTION PANEL
ALT: 12 (ref 10–40)
AST: 13 — AB (ref 14–40)
Alkaline Phosphatase: 48 (ref 25–125)
Bilirubin, Total: 0.8

## 2018-10-16 DIAGNOSIS — M79672 Pain in left foot: Secondary | ICD-10-CM | POA: Diagnosis not present

## 2018-10-16 DIAGNOSIS — B351 Tinea unguium: Secondary | ICD-10-CM | POA: Diagnosis not present

## 2018-10-16 DIAGNOSIS — L84 Corns and callosities: Secondary | ICD-10-CM | POA: Diagnosis not present

## 2018-10-16 DIAGNOSIS — M79671 Pain in right foot: Secondary | ICD-10-CM | POA: Diagnosis not present

## 2018-10-22 DIAGNOSIS — R319 Hematuria, unspecified: Secondary | ICD-10-CM | POA: Diagnosis not present

## 2018-10-22 DIAGNOSIS — D519 Vitamin B12 deficiency anemia, unspecified: Secondary | ICD-10-CM | POA: Diagnosis not present

## 2018-10-22 DIAGNOSIS — E7800 Pure hypercholesterolemia, unspecified: Secondary | ICD-10-CM | POA: Diagnosis not present

## 2018-10-22 DIAGNOSIS — E230 Hypopituitarism: Secondary | ICD-10-CM | POA: Diagnosis not present

## 2018-10-22 DIAGNOSIS — Z905 Acquired absence of kidney: Secondary | ICD-10-CM | POA: Diagnosis not present

## 2018-10-22 DIAGNOSIS — Z8553 Personal history of malignant neoplasm of renal pelvis: Secondary | ICD-10-CM | POA: Diagnosis not present

## 2018-10-22 DIAGNOSIS — N401 Enlarged prostate with lower urinary tract symptoms: Secondary | ICD-10-CM | POA: Diagnosis not present

## 2018-10-22 DIAGNOSIS — I8390 Asymptomatic varicose veins of unspecified lower extremity: Secondary | ICD-10-CM | POA: Diagnosis not present

## 2018-10-22 DIAGNOSIS — E559 Vitamin D deficiency, unspecified: Secondary | ICD-10-CM | POA: Diagnosis not present

## 2018-10-22 DIAGNOSIS — I10 Essential (primary) hypertension: Secondary | ICD-10-CM | POA: Diagnosis not present

## 2018-10-22 DIAGNOSIS — R600 Localized edema: Secondary | ICD-10-CM | POA: Diagnosis not present

## 2018-10-22 DIAGNOSIS — Z9889 Other specified postprocedural states: Secondary | ICD-10-CM | POA: Diagnosis not present

## 2018-11-01 ENCOUNTER — Other Ambulatory Visit (INDEPENDENT_AMBULATORY_CARE_PROVIDER_SITE_OTHER): Payer: Self-pay | Admitting: Physical Medicine and Rehabilitation

## 2018-11-01 ENCOUNTER — Encounter (INDEPENDENT_AMBULATORY_CARE_PROVIDER_SITE_OTHER): Payer: Self-pay | Admitting: Physical Medicine and Rehabilitation

## 2018-11-01 ENCOUNTER — Telehealth (INDEPENDENT_AMBULATORY_CARE_PROVIDER_SITE_OTHER): Payer: Self-pay | Admitting: Physical Medicine and Rehabilitation

## 2018-11-01 DIAGNOSIS — M5416 Radiculopathy, lumbar region: Secondary | ICD-10-CM

## 2018-11-01 DIAGNOSIS — M48062 Spinal stenosis, lumbar region with neurogenic claudication: Secondary | ICD-10-CM

## 2018-11-01 NOTE — Telephone Encounter (Signed)
Perfect thank you!

## 2018-11-01 NOTE — Telephone Encounter (Signed)
Ov, if he takes one per day for 5 days or so (may already be there) then ok to just stop, was it helping at all?

## 2018-11-01 NOTE — Progress Notes (Signed)
[name] - 85 y.o. male MRN [id number]  Date of birth: [birth year]  Office Visit Note: Visit Date: 10/05/2018 PCP: [name], DO Referred by: [name], DO  Subjective: Chief Complaint  Patient presents with  . Lower Back - Pain  . Left Leg - Pain   HPI: [name] [name]. is a 85 y.o. male who comes in today For reevaluation management of left radicular leg pain in a pretty classic L5 distribution that is been really recalcitrant to injection treatment by initially Dr. Herma Mering and now by myself.  Initial injection performed from an L5 approach transforaminal gave him quite a bit of relief and this was good diagnostically but this did not help more than a couple months but it did give him some relief.  Unfortunately we came back and injected the L4 and L5 nerve roots without as much success.  He has moderate multifactorial stenosis at L4-5 and he has arthritis at L5-S1 with small facet joint cyst and likely irritation of the L5 nerve root at either level.  He has no right-sided complaints.  No new issues.  No bowel or bladder changes.  History is significant for prior knee arthroplasty and prior pituitary gland resection for which she does take baseline prednisone.  He has had no falls or trauma.  We did call in a prescription for Lyrica he has started to take this over the last few days without much relief but he is tolerating it.  Review of Systems  Constitutional: Negative for chills, fever, malaise/fatigue and weight loss.  HENT: Negative for hearing loss and sinus pain.   Eyes: Negative for blurred vision, double vision and photophobia.  Respiratory: Negative for cough and shortness of breath.   Cardiovascular: Negative for chest pain, palpitations and leg swelling.  Gastrointestinal: Negative for abdominal pain, nausea and vomiting.  Genitourinary: Negative for flank pain.  Musculoskeletal: Negative for myalgias.       Left radicular leg pain  Skin: Negative for  itching and rash.  Neurological: Negative for tremors, focal weakness and weakness.  Endo/Heme/Allergies: Negative.   Psychiatric/Behavioral: Negative for depression.  All other systems reviewed and are negative.  Otherwise per HPI.  Assessment & Plan: Visit Diagnoses:  1. Lumbar radiculopathy   2. Spinal stenosis of lumbar region with neurogenic claudication   3. Synovial cyst of lumbar facet joint     Plan: Findings:  Continued left radicular leg pain pretty classic L5 distribution despite injection in January from an L4 and L5 approach.  Injections look well-placed.  I think at this point is worth trying the Lyrica see if we can go to a dose of 75 mg twice a day.  If he can get to that level and tolerate it and maybe will have a chance of getting some relief that sustained other than the injection.  He is probably not a great surgical candidate at his age which is [age].  He has had prior orthopedic surgeries in the past.  We will see how he does with the Lyrica he gets any swelling in the legs or any vision changes and we would stop that medication.  Could try something like Topamax or gabapentin.  If he is just not getting much better we will try a repeat MRI.  Last March 2018.    Meds & Orders: No orders of the defined types were placed in this encounter.  No orders of the defined types were placed in this encounter.  Follow-up: Return in about 4 weeks (around 11/02/2018).   Procedures: No procedures performed  No notes on file   Clinical History: Lumbar spine MRI dated 2018 is completed at [facility].  At L4-5 there is moderate multifactorial stenosis with foraminal narrowing and disc protrusion on the left.  At L5-S1 there is facet arthropathy which is pretty significant bilaterally with facet joint cyst on the left.   He reports that he quit smoking about 30 years ago. His smoking use included pipe. He has never used smokeless tobacco.  Recent Labs    11/18/17 0644  03/20/18 1349  HGBA1C 6.2* 6.5*    Objective:  VS:  HT:5' 11.5" (181.6 cm)   WT:170 lb (77.1 kg)  BMI:23.38    BP:(!) 141/64  HR:68bpm  TEMP: ( )  RESP:  Physical Exam Vitals signs and nursing note reviewed.  Constitutional:      General: He is not in acute distress.    Appearance: Normal appearance. He is well-developed.  HENT:     Head: Normocephalic and atraumatic.  Eyes:     Conjunctiva/sclera: Conjunctivae normal.     Pupils: Pupils are equal, round, and reactive to light.  Neck:     Musculoskeletal: Normal range of motion and neck supple. No neck rigidity.  Cardiovascular:     Rate and Rhythm: Normal rate.     Pulses: Normal pulses.     Heart sounds: Normal heart sounds.  Pulmonary:     Effort: Pulmonary effort is normal. No respiratory distress.  Musculoskeletal:     Right lower leg: No edema.     Left lower leg: No edema.     Comments: Patient ambulates with a forward flexed lumbar spine using a rolling walker.  He has good strength in the legs bilaterally and symmetric.  He has good dorsiflexion plantarflexion EHL.  He does have some dysesthesia in the L5 distribution to light touch.  No pain with hip rotation.  Skin:    General: Skin is warm and dry.     Findings: No erythema or rash.  Neurological:     General: No focal deficit present.     Mental Status: He is alert and oriented to person, place, and time.     Sensory: No sensory deficit.     Coordination: Coordination normal.     Gait: Gait normal.  Psychiatric:        Mood and Affect: Mood normal.        Behavior: Behavior normal.     Ortho Exam Imaging: No results found.  Past Medical/Family/Surgical/Social History: Medications & Allergies reviewed per EMR, new medications updated. Patient Active Problem List   Diagnosis Date Noted  . Balance problem 07/11/2018  . 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 Chronic left-sided low back pain with left-sided  sciatica 03/27/2018  . Osteomyelitis of second toe of right foot ([place]) 03/20/2018  . History of complete ray amputation of first toe of right foot ([place]) 12/28/2017  . Osteomyelitis of great toe of right foot ([place])   . Idiopathic chronic venous hypertension of both lower extremities with inflammation 12/13/2017  . Thrush 11/29/2017  . Cellulitis of right foot 11/18/2017  . Cellulitis of right ankle 11/18/2017  . Adrenal insufficiency ([place]) 07/01/2017  . Bulging of intervertebral disc between L4 and L5 07/01/2017  . At high risk for bleeding 07/01/2017  . Right buttock pain 02/08/2017  . Leukocytosis 10/12/2016  . HLD (hyperlipidemia) 04/20/2016  .  BPH (benign prostatic hyperplasia) 12/08/2015  . CKD (chronic kidney disease) stage 3, GFR 30-59 ml/min (HCC) 11/27/2015  . S/P total knee arthroplasty 07/13/2015  . OA (osteoarthritis) of knee 06/29/2015  . Ectropion 05/06/2015  . Lateral meniscal tear 02/05/2015  . Hyponatremia 10/20/2013  . Weakness 10/19/2013  . Hypertension   . 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 Hypothyroidism (acquired)   . Anemia, B12 deficiency   . Meibomian gland disease 08/06/2013  . Primary open angle glaucoma 06/19/2013  . Arthritis of shoulder region, right 12/15/2011  . Exposure keratitis 08/17/2011  . Dry eye 07/13/2011   Past Medical History:  Diagnosis Date  . Allergic rhinitis    Prior allergy shots 20 years  . Anemia   . Anemia, B12 deficiency 2000  . Arthritis    Status post left total replacement 8 2011  . BPH (benign prostatic hyperplasia) 2013  . Cancer ([place])    renal cell ca and skin cancer   . Colitis 2014  . Difficult intubation 1994   surgery had to be  stopped due to injury to "throat"  . Difficult intubation 1994   no trouble since.  Required nasotracheal intubation  '02 [facility] / ANESTHESIA RECORD FROM 2013 AND 2016 IN EPIC  . GERD (gastroesophageal reflux disease)   . Glaucoma 2007   Status post left trabeculectomy  2007  . History of colon polyps    Colonoscopy 2001  . History of kidney stones   . History of renal cell carcinoma 1994   Status post right nephrectomy  . Hyperlipidemia 2003  . Hypertension   . 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 Hypothyroidism (acquired) 2000  . Nocturia   . Osteomyelitis ([place])    right great  . Osteomyelitis ([place])    RIGHT FOOT /TOE  . Pituitary mass ([place]) 2000   S/p transphenoidal excision 05/1999 ([facility])  . Vitamin D deficiency 2009   Family History  Problem Relation Age of Onset  . Lung cancer Father   . Anesthesia problems Neg Hx   . Hypotension Neg Hx   . Malignant hyperthermia Neg Hx   . Pseudochol deficiency Neg Hx    Past Surgical History:  Procedure Laterality Date  . AMPUTATION Right 12/20/2017   Procedure: RIGHT 1ST RAY AMPUTATION;  Surgeon: [name], MD;  Location: [facility];  Service: Orthopedics;  Laterality: Right;  . AMPUTATION Right 03/23/2018   Procedure: RIGHT 2ND TOE AMPUTATION;  Surgeon: [name], MD;  Location: [facility];  Service: Orthopedics;  Laterality: Right;  . BRAIN SURGERY  2000   pituatary gland removed .  Marland Kitchen CHOLECYSTECTOMY  1984  . Ectropion surgery Bilateral 2006  . EYE SURGERY  over last 6 yrs.     trabeculectomy...   . EYE SURGERY   cat ext ou  . JOINT REPLACEMENT  2011   knee left  . KNEE ARTHROSCOPY Right 02/06/2015   Procedure: RIGHT ARTHROSCOPY KNEE WITH LATERAL MENSICAL  DEBRIDEMENT;  Surgeon: Gaynelle Arabian, MD;  Location: [facility];  Service: Orthopedics;  Laterality: Right;  . Mohs procedure      for skin cancer on nose   . NEPHRECTOMY Right 1994   Renal cell  . REVERSE SHOULDER ARTHROPLASTY  12/15/2011   Procedure: REVERSE SHOULDER ARTHROPLASTY;  Surgeon: Marin Shutter, MD;  Location: [facility];  Service: Orthopedics;  Laterality: Right;  right total reverse shoulder  . TONSILLECTOMY    . TOTAL  KNEE ARTHROPLASTY Right 06/29/2015   Procedure: TOTAL  KNEE ARTHROPLASTY;  Surgeon: Gaynelle Arabian, MD;  Location: [facility];  Service: Orthopedics;  Laterality: Right;  . Transsphenoidal excision pituitary tumor  05/1999   A.Tommi Rumps, M.D.([facility])   Social History   Occupational History  . Not on file  Tobacco Use  . Smoking status: Former Smoker    Types: Pipe    Last attempt to quit: 09/26/1988    Years since quitting: 30.1  . Smokeless tobacco: Never Used  . Tobacco comment: about 20 years  Substance and Sexual Activity  . Alcohol use: Yes    Comment: 2-3 drinks per week  . Drug use: No  . Sexual activity: Not Currently

## 2018-11-01 NOTE — Telephone Encounter (Signed)
Patient states that Lyrica was not helping at all. Today he states that this is his first day taking once a day. I advised to continue this for 5 days and then stop. Scheduled for OV.

## 2018-11-13 ENCOUNTER — Telehealth (INDEPENDENT_AMBULATORY_CARE_PROVIDER_SITE_OTHER): Payer: Self-pay | Admitting: Orthopedic Surgery

## 2018-11-13 NOTE — Telephone Encounter (Signed)
I called pt and advised per Dr. Sharol Given would need appt tomorrow will come in at 10:30

## 2018-11-14 ENCOUNTER — Ambulatory Visit (INDEPENDENT_AMBULATORY_CARE_PROVIDER_SITE_OTHER): Payer: PPO | Admitting: Family

## 2018-11-14 ENCOUNTER — Encounter: Payer: Self-pay | Admitting: Internal Medicine

## 2018-11-14 ENCOUNTER — Non-Acute Institutional Stay: Payer: PPO | Admitting: Internal Medicine

## 2018-11-14 ENCOUNTER — Encounter (INDEPENDENT_AMBULATORY_CARE_PROVIDER_SITE_OTHER): Payer: Self-pay | Admitting: Family

## 2018-11-14 ENCOUNTER — Other Ambulatory Visit: Payer: Self-pay

## 2018-11-14 VITALS — BP 120/70 | HR 69 | Temp 98.3°F | Ht 72.0 in | Wt 177.0 lb

## 2018-11-14 VITALS — Ht 71.5 in | Wt 170.0 lb

## 2018-11-14 DIAGNOSIS — R2689 Other abnormalities of gait and mobility: Secondary | ICD-10-CM

## 2018-11-14 DIAGNOSIS — I87323 Chronic venous hypertension (idiopathic) with inflammation of bilateral lower extremity: Secondary | ICD-10-CM

## 2018-11-14 DIAGNOSIS — L97921 Non-pressure chronic ulcer of unspecified part of left lower leg limited to breakdown of skin: Secondary | ICD-10-CM

## 2018-11-14 DIAGNOSIS — I872 Venous insufficiency (chronic) (peripheral): Secondary | ICD-10-CM | POA: Diagnosis not present

## 2018-11-14 DIAGNOSIS — N183 Chronic kidney disease, stage 3 unspecified: Secondary | ICD-10-CM

## 2018-11-14 DIAGNOSIS — E1142 Type 2 diabetes mellitus with diabetic polyneuropathy: Secondary | ICD-10-CM | POA: Insufficient documentation

## 2018-11-14 DIAGNOSIS — Z89421 Acquired absence of other right toe(s): Secondary | ICD-10-CM | POA: Diagnosis not present

## 2018-11-14 DIAGNOSIS — E2740 Unspecified adrenocortical insufficiency: Secondary | ICD-10-CM

## 2018-11-14 DIAGNOSIS — E893 Postprocedural hypopituitarism: Secondary | ICD-10-CM

## 2018-11-14 NOTE — Progress Notes (Signed)
Office Visit Note   Patient: [name] [name].           Date of Birth: 1923-01-01           MRN: [id number] Visit Date: 11/14/2018              Requested by: [name], DO [address] [place], [place] [address] PCP: [name], DO  Chief Complaint  Patient presents with  . Left Leg - Edema, Pain      HPI: The patient is a 85 year old gentleman who presents today complaining of edema to his bilateral lower extremities with weeping ulcers to the left leg.  He recently was started on Lyrica for some radiculopathy.  This caused swelling to his lower extremities and has discontinued the Lyrica.  However has had difficulty donning his compression stockings due to the swelling.  None donning compression stocking 3 days ago traumatically open some blisters to the back of his left leg.  They are concerned for infection.  He does have a history of cellulitis to his right lower extremity  Assessment & Plan: Visit Diagnoses: No diagnosis found.  Plan: Due to difficulty with compression stockings and extent of swelling we will apply compression garments today.  Discussed that this will likely be serial wrapping for a number of weeks until he can get the ulcers healed and swelling back down to where he is comfortable wearing his compression garments again.  Have discussed option of following up with [place] in the office versus at [facility] with his primary care provider.  At this point patient would like to follow with Dr. Mariea Clonts.  Not comfortable coming to the office given the current situation with coronavirus. Agreement with plan. Patient to see dr. Mariea Clonts later today.   Feel the patient would benefit from Dynaflex wraps with silver cell over his to posterior ulcers with wraps being changed twice weekly for the next 2 weeks.  Follow-Up Instructions: No follow-ups on file.   Ortho Exam  Patient is alert, oriented, no adenopathy, well-dressed, normal affect, normal respiratory effort.  On examination has 2+ pitting edema to bilateral lower extremities left is worse than right.  There is no erythema no warmth.  He does have some blistering to his medial left leg to ulcerations posteriorly these are 1 cm in diameter with fibrinous tissue in the bed.  No purulence.  He does have weeping serous fluid collecting in a puddle on the floor.  No erythema no concerning signs for cellulitis.  Imaging: No results found. No images are attached to the encounter.  Labs: Lab Results  Component Value Date   HGBA1C 6.5 (H) 03/20/2018   HGBA1C 6.2 (H) 11/18/2017   ESRSEDRATE 35 (H) 11/29/2017   ESRSEDRATE 57 (H) 11/18/2017   CRP 11.4 (H) 11/29/2017   CRP 8.5 (H) 11/18/2017   REPTSTATUS 03/25/2018 FINAL 03/20/2018   CULT  03/20/2018    NO GROWTH 5 DAYS Performed at [facility], [place] [address] [place], [place] [address]      Lab Results  Component Value Date   ALBUMIN 2.4 (L) 03/21/2018   ALBUMIN 3.2 (L) 03/20/2018   ALBUMIN 2.7 (L) 11/29/2017   PREALBUMIN 14.7 (L) 11/18/2017    Body mass index is 23.38 kg/m.  Orders:  No orders of the defined types were placed in this encounter.  No orders of the defined types were placed in this encounter.    Procedures: No procedures performed  Clinical Data: No  additional findings.  ROS:  All other systems negative, except as noted in the HPI. Review of Systems  Objective: Vital Signs: Ht 5' 11.5" (1.816 m)   Wt 170 lb (77.1 kg)   BMI 23.38 kg/m   Specialty Comments:  No specialty comments available.  PMFS History: Patient Active Problem List   Diagnosis Date Noted  . Balance problem 07/11/2018  . 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 Chronic left-sided low back pain with left-sided sciatica 03/27/2018  . Osteomyelitis of second toe of right foot ([place]) 03/20/2018  . History of complete ray amputation of first toe of right foot ([place]) 12/28/2017  . Osteomyelitis  of great toe of right foot ([place])   . Idiopathic chronic venous hypertension of both lower extremities with inflammation 12/13/2017  . Thrush 11/29/2017  . Cellulitis of right foot 11/18/2017  . Cellulitis of right ankle 11/18/2017  . Adrenal insufficiency ([place]) 07/01/2017  . Bulging of intervertebral disc between L4 and L5 07/01/2017  . At high risk for bleeding 07/01/2017  . Right buttock pain 02/08/2017  . Leukocytosis 10/12/2016  . HLD (hyperlipidemia) 04/20/2016  . BPH (benign prostatic hyperplasia) 12/08/2015  . CKD (chronic kidney disease) stage 3, GFR 30-59 ml/min (HCC) 11/27/2015  . S/P total knee arthroplasty 07/13/2015  . OA (osteoarthritis) of knee 06/29/2015  . Ectropion 05/06/2015  . Lateral meniscal tear 02/05/2015  . Hyponatremia 10/20/2013  . Weakness 10/19/2013  . Hypertension   . 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 Hypothyroidism (acquired)   . Anemia, B12 deficiency   . Meibomian gland disease 08/06/2013  . Primary open angle glaucoma 06/19/2013  . Arthritis of shoulder region, right 12/15/2011  . Exposure keratitis 08/17/2011  . Dry eye 07/13/2011   Past Medical History:  Diagnosis Date  . Allergic rhinitis    Prior allergy shots 20 years  . Anemia   . Anemia, B12 deficiency 2000  . Arthritis    Status post left total replacement 8 2011  . BPH (benign prostatic hyperplasia) 2013  . Cancer ([place])    renal cell ca and skin cancer   . Colitis 2014  . Difficult intubation 1994   surgery had to be  stopped due to injury to "throat"  . Difficult intubation 1994   no trouble since.  Required nasotracheal intubation  '02 [facility] / ANESTHESIA RECORD FROM 2013 AND 2016 IN EPIC  . GERD (gastroesophageal reflux disease)   . Glaucoma 2007   Status post left trabeculectomy 2007  . History of colon polyps    Colonoscopy 2001  . History of kidney stones   . History of renal cell carcinoma 1994   Status post right nephrectomy  . Hyperlipidemia 2003   . Hypertension   . 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 Hypothyroidism (acquired) 2000  . Nocturia   . Osteomyelitis ([place])    right great  . Osteomyelitis ([place])    RIGHT FOOT /TOE  . Pituitary mass ([place]) 2000   S/p transphenoidal excision 05/1999 ([facility])  . Vitamin D deficiency 2009    Family History  Problem Relation Age of Onset  . Lung cancer Father   . Anesthesia problems Neg Hx   . Hypotension Neg Hx   . Malignant hyperthermia Neg Hx   . Pseudochol deficiency Neg Hx     Past Surgical History:  Procedure Laterality Date  . AMPUTATION Right 12/20/2017  Procedure: RIGHT 1ST RAY AMPUTATION;  Surgeon: [name], MD;  Location: [facility];  Service: Orthopedics;  Laterality: Right;  . AMPUTATION Right 03/23/2018   Procedure: RIGHT 2ND TOE AMPUTATION;  Surgeon: [name], MD;  Location: [facility];  Service: Orthopedics;  Laterality: Right;  . BRAIN SURGERY  2000   pituatary gland removed .  Marland Kitchen CHOLECYSTECTOMY  1984  . Ectropion surgery Bilateral 2006  . EYE SURGERY  over last 6 yrs.     trabeculectomy...   . EYE SURGERY   cat ext ou  . JOINT REPLACEMENT  2011   knee left  . KNEE ARTHROSCOPY Right 02/06/2015   Procedure: RIGHT ARTHROSCOPY KNEE WITH LATERAL MENSICAL  DEBRIDEMENT;  Surgeon: Gaynelle Arabian, MD;  [facility];  Service: Orthopedics;  Laterality: Right;  . Mohs procedure      for skin cancer on nose   . NEPHRECTOMY Right 1994   Renal cell  . REVERSE SHOULDER ARTHROPLASTY  12/15/2011   Procedure: REVERSE SHOULDER ARTHROPLASTY;  Surgeon: Marin Shutter, MD;  Location: [facility];  Service: Orthopedics;  Laterality: Right;  right total reverse shoulder  . TONSILLECTOMY    . TOTAL KNEE ARTHROPLASTY Right 06/29/2015   Procedure: TOTAL KNEE ARTHROPLASTY;  Surgeon: Gaynelle Arabian, MD;  [facility];  Service: Orthopedics;  Laterality: Right;  . Transsphenoidal excision pituitary tumor  05/1999   A.Tommi Rumps,  M.D.([facility])   Social History   Occupational History  . Not on file  Tobacco Use  . Smoking status: Former Smoker    Types: Pipe    Last attempt to quit: 09/26/1988    Years since quitting: 30.1  . Smokeless tobacco: Never Used  . Tobacco comment: about 20 years  Substance and Sexual Activity  . Alcohol use: Yes    Comment: 2-3 drinks per week  . Drug use: No  . Sexual activity: Not Currently

## 2018-11-14 NOTE — Progress Notes (Signed)
Location:  Occupational psychologist of Service:  Clinic (12)  Provider: Ayona [name] [name] [name], D.O., C.M.D.  Code Status: DNR Goals of Care:  Advanced Directives 07/03/2018  Does Patient Have a Medical Advance Directive? Yes  Type of Paramedic of [name] [name];Out of facility DNR (pink MOST or yellow form)  Does patient want to make changes to medical advance directive? No - Patient declined  Copy of [place] in Chart? Yes  Would patient like information on creating a medical advance directive? -  Pre-existing out of facility DNR order (yellow form or pink MOST form) Yellow form placed in chart (order not valid for inpatient use);Pink MOST form placed in chart (order not valid for inpatient use)     Chief Complaint  Patient presents with  . Medical Management of Chronic Issues    77mth follow-up    HPI: Patient is a 85 y.o. male seen today for medical management of chronic diseases.   [name] [name] has just been to Dr. Jess Barters office due to swelling in his legs for the past two weeks.  He woke up yesterday morning with drainage on his sheets coming from the right leg.  He does not report pain.  At Dr. Jess Barters, they applied silver cell to the blisters of the left leg and triple layer wraps to both legs.  These are to be changed twice a week--clinic nurse has been informed and will get this scheduled for him and be sure we have all the necessary supplies here at [place].    Eye exam? Goes to Dr.Bond for glaucoma and Dr. Gevena Cotton.  Beatrix Fetters for his close vision.    Foot exam?  Sees endocrine for his diabetes, adrenal insufficiency and panhypopituitarism.    Not on ace/arb likely due to multiple hospitalizations with dehydration and orthostatic hypotension.    He did not tolerate the muscle relaxer from Dr. Ernestina Patches.  He's waiting to get an MRI of his lumbar spine done.    Past Medical History:  Diagnosis Date  . Allergic rhinitis     Prior allergy shots 20 years  . Anemia   . Anemia, B12 deficiency 2000  . Arthritis    Status post left total replacement 8 2011  . BPH (benign prostatic hyperplasia) 2013  . Cancer ([place])    renal cell ca and skin cancer   . Colitis 2014  . Difficult intubation 1994   surgery had to be  stopped due to injury to "throat"  . Difficult intubation 1994   no trouble since.  Required nasotracheal intubation  '02 [facility] / ANESTHESIA RECORD FROM 2013 AND 2016 IN EPIC  . GERD (gastroesophageal reflux disease)   . Glaucoma 2007   Status post left trabeculectomy 2007  . History of colon polyps    Colonoscopy 2001  . History of kidney stones   . History of renal cell carcinoma 1994   Status post right nephrectomy  . Hyperlipidemia 2003  . Hypertension   . 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 Hypothyroidism (acquired) 2000  . Nocturia   . Osteomyelitis ([place])    right great  . Osteomyelitis ([place])    RIGHT FOOT /TOE  . Pituitary mass ([place]) 2000   S/p transphenoidal excision 05/1999 ([facility])  . Vitamin D deficiency 2009    Past Surgical History:  Procedure Laterality Date  . AMPUTATION Right 12/20/2017   Procedure: RIGHT 1ST RAY AMPUTATION;  Surgeon: [name], MD;  Location: [facility];  Service: Orthopedics;  Laterality: Right;  . AMPUTATION Right 03/23/2018   Procedure: RIGHT 2ND TOE AMPUTATION;  Surgeon: [name], MD;  Location: [facility];  Service: Orthopedics;  Laterality: Right;  . BRAIN SURGERY  2000   pituatary gland removed .  Marland Kitchen CHOLECYSTECTOMY  1984  . Ectropion surgery Bilateral 2006  . EYE SURGERY  over last 6 yrs.     trabeculectomy...   . EYE SURGERY   cat ext ou  . JOINT REPLACEMENT  2011   knee left  . KNEE ARTHROSCOPY Right 02/06/2015   Procedure: RIGHT ARTHROSCOPY KNEE WITH LATERAL MENSICAL  DEBRIDEMENT;  Surgeon: Gaynelle Arabian, MD;  Location: [facility];  Service: Orthopedics;  Laterality: Right;  . Mohs  procedure      for skin cancer on nose   . NEPHRECTOMY Right 1994   Renal cell  . REVERSE SHOULDER ARTHROPLASTY  12/15/2011   Procedure: REVERSE SHOULDER ARTHROPLASTY;  Surgeon: Marin Shutter, MD;  Location: [facility];  Service: Orthopedics;  Laterality: Right;  right total reverse shoulder  . TONSILLECTOMY    . TOTAL KNEE ARTHROPLASTY Right 06/29/2015   Procedure: TOTAL KNEE ARTHROPLASTY;  Surgeon: Gaynelle Arabian, MD;  Location: [facility];  Service: Orthopedics;  Laterality: Right;  . Transsphenoidal excision pituitary tumor  05/1999   A.Tommi Rumps, M.D.([facility])    Allergies  Allergen Reactions  . Adhesive [Tape] Other (See Comments)    CAUSES SKIN TEARS, prefers paper tape   . Percocet [Oxycodone-Acetaminophen] Other (See Comments)    UNSPECIFIED REACTION  Does not want to take  . Robaxin [Methocarbamol]     UNSPECIFIED REACTION   . Diovan [Valsartan] Rash    Outpatient Encounter Medications as of 11/14/2018  Medication Sig  . acetaminophen (TYLENOL) 500 MG tablet Take 500 mg by mouth daily as needed for fever.   . Cholecalciferol (VITAMIN D) 2000 units tablet Take 2,000 Units by mouth daily.  . Coenzyme Q10 300 MG CAPS Take 300 mg by mouth daily.   Marland Kitchen docusate sodium (COLACE) 100 MG capsule Take 200 mg by mouth at bedtime.  . finasteride (PROSCAR) 5 MG tablet Take 5 mg by mouth daily.   . Flaxseed, Linseed, (EQL FLAXSEED OIL) 1200 MG CAPS Take as directed once a day  . fluconazole (DIFLUCAN) 150 MG tablet   . Multiple Vitamins-Minerals (MULTIVITAMIN & MINERAL PO) Take 1 tablet by mouth daily.  . Omega-3 Fatty Acids (OMEGA-3 FISH OIL PO) Take 700 mg by mouth daily.  . pravastatin (PRAVACHOL) 80 MG tablet Take 80 mg by mouth every other day.   . predniSONE (DELTASONE) 5 MG tablet Take 2.5-5 mg by mouth See admin instructions. 5mg  in the morning and 2.5mg  in the evening  . pregabalin (LYRICA) 50 MG capsule Take 1 capsule (50 mg total) by mouth daily for 7 days, THEN 1 capsule (50 mg total) 2  (two) times daily for 21 days.  Marland Kitchen SYNTHROID 88 MCG tablet Take 88 mcg by mouth daily before breakfast.   . testosterone cypionate (DEPOTESTOSTERONE CYPIONATE) 200 MG/ML injection Inject 30 mg (0.15 ml) intramuscularly weekly. Must discard vial after 90 days.  Marland Kitchen timolol (TIMOPTIC) 0.5 % ophthalmic solution Place 1 drop into the right eye daily.  . vitamin B-12 (CYANOCOBALAMIN) 500 MCG tablet Take 500 mcg by mouth daily.   Facility-Administered Encounter Medications as of 11/14/2018  Medication  . mupirocin cream (BACTROBAN) 2 %    Review of Systems:  Review of  Systems  Constitutional: Negative for chills, fever and malaise/fatigue.  HENT: Positive for hearing loss.   Eyes: Negative for blurred vision.  Respiratory: Positive for cough. Negative for sputum production and shortness of breath.   Cardiovascular: Positive for leg swelling. Negative for chest pain and palpitations.  Gastrointestinal: Negative for abdominal pain.  Genitourinary: Negative for dysuria.  Musculoskeletal: Positive for back pain.  Skin: Negative for itching and rash.  Neurological: Positive for tingling and sensory change.  Endo/Heme/Allergies: Bruises/bleeds easily.  Psychiatric/Behavioral: Negative for depression and memory loss. The patient is not nervous/anxious and does not have insomnia.     Health Maintenance  Topic Date Due  . FOOT EXAM  01/22/1933  . OPHTHALMOLOGY EXAM  01/22/1933  . URINE MICROALBUMIN  01/22/1933  . HEMOGLOBIN A1C  09/20/2018  . TETANUS/TDAP  06/08/2028  . INFLUENZA VACCINE  Completed  . PNA vac Low Risk Adult  Completed    Physical Exam: There were no vitals filed for this visit. There is no height or weight on file to calculate BMI. Physical Exam Constitutional:      General: He is not in acute distress.    Appearance: Normal appearance. He is normal weight.  HENT:     Head: Normocephalic and atraumatic.     Ears:     Comments: HOH, hearing aids Eyes:     Comments:  glasses  Cardiovascular:     Rate and Rhythm: Normal rate and regular rhythm.     Pulses: Normal pulses.     Heart sounds: Normal heart sounds.  Pulmonary:     Effort: Pulmonary effort is normal.     Breath sounds: Normal breath sounds.  Neurological:     Mental Status: He is alert.     Labs reviewed: Basic Metabolic Panel: Recent Labs    03/20/18 1343 03/20/18 1602  03/22/18 0339 03/23/18 0400 03/24/18 0709  NA 135  --    < > 137 139 136  K 4.1  --    < > 4.2 3.9 3.7  CL 100  --    < > 101 104 99  CO2 26  --    < > 28 27 29   GLUCOSE 85  --    < > 111* 108* 92  BUN 16  --    < > 13 17 15   CREATININE 1.28*  --    < > 1.55* 1.46* 1.54*  CALCIUM 9.4  --    < > 9.2 9.0 8.8*  MG 2.0  --   --   --   --   --   TSH  --  <0.010*  --   --   --   --    < > = values in this interval not displayed.   Liver Function Tests: Recent Labs    11/29/17 1210 03/20/18 1343 03/21/18 0512  AST 15 17 15   ALT 13* 15 14  ALKPHOS 51 49 43  BILITOT 1.3* 0.8 1.0  PROT 5.5* 6.5 5.3*  ALBUMIN 2.7* 3.2* 2.4*   No results for input(s): LIPASE, AMYLASE in the last 8760 hours. No results for input(s): AMMONIA in the last 8760 hours. CBC: Recent Labs    11/17/17 2326  11/29/17 1210  03/22/18 0339 03/23/18 0400 03/24/18 0709  WBC 14.7*   < > 30.5*   < > 12.3* 11.6* 15.0*  NEUTROABS 11.7*  --  26.5*  --   --   --   --   HGB 10.9*   < >  10.9*   < > 12.1* 11.5* 11.7*  HCT 33.2*   < > 32.8*   < > 37.0* 35.9* 36.2*  MCV 97.1   < > 98.8   < > 98.7 98.6 98.6  PLT 270   < > 227   < > 267 286 277   < > = values in this interval not displayed.   Lipid Panel: No results for input(s): CHOL, HDL, LDLCALC, TRIG, CHOLHDL, LDLDIRECT in the last 8760 hours. Lab Results  Component Value Date   HGBA1C 6.5 (H) 03/20/2018   REviewed note from Dr. Elyse Hsu (endocrine) with labs  Assessment/Plan 1. Leg ulcer, left, limited to breakdown of skin ([place]) -new with increased venous insufficiency -saw Dr.  Sharol Given before seeing me about it and legs already wrapped with triple layer unnaboots  -will get dressings and wraps changed twice a week with [facility] clinic nurses--monitor for infection or worsened skin breakdown  2. Adrenal insufficiency (HCC) -cont his chronic steroids due to this -unfortunately chronic steroids are a big culprit with this current leg ulcers  3. Chronic venous insufficiency of lower extremity -cont compression wraps thru ortho/wound care  4. CKD (chronic kidney disease) stage 3, GFR 30-59 ml/min (HCC) -stable as long as he stays healthy, but high risk for ARF when his intake declines or he gets a fever for any reason -Avoid nephrotoxic agents like nsaids, dose adjust renally excreted meds, hydrate. -push po fluids  5. 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 Balance problem -ongoing, had not brought his cane or walker along and had to hold onto me to get from the vitals room to the exam room (had ridden over in his scooter)  7. History of complete ray amputation of second toe of right foot ([place]) -still amputated, no new wounds on feet, but does have limited ulceration of leg as above  8. Diabetic polyneuropathy associated with type 2 diabetes mellitus (HCC) -limited sensation in feet and legs so he's prone to wounds he's unaware of--requires close monitoring especially with prednisone, current ulcers and overall increasing frailty with age of 75  Labs/tests ordered:  No new Next appt:  03/13/2019  Zanyah Lentsch L. Denika Krone, D.O. [facility] [address], [place] [address] Cell Phone (Mon-Fri 8am-5pm):  [phone] On Call:  [phone] & follow prompts after 5pm & weekends Office Phone:  [phone] Office Fax:  [phone]

## 2018-11-15 ENCOUNTER — Telehealth (INDEPENDENT_AMBULATORY_CARE_PROVIDER_SITE_OTHER): Payer: Self-pay | Admitting: *Deleted

## 2018-11-15 NOTE — Telephone Encounter (Signed)
-----   Message from Suzan Slick, NP sent at 11/14/2018  1:09 PM EDT ----- Will you check on his mri

## 2018-11-15 NOTE — Telephone Encounter (Signed)
Pt is scheduled 11/16/18 at 9:20am with [facility]

## 2018-11-16 ENCOUNTER — Other Ambulatory Visit: Payer: Self-pay

## 2018-11-16 ENCOUNTER — Ambulatory Visit
Admission: RE | Admit: 2018-11-16 | Discharge: 2018-11-16 | Disposition: A | Discharge status: Home / Self Care | Payer: PPO | Source: Ambulatory Visit | Attending: Physical Medicine and Rehabilitation | Admitting: Physical Medicine and Rehabilitation

## 2018-11-16 DIAGNOSIS — M48062 Spinal stenosis, lumbar region with neurogenic claudication: Secondary | ICD-10-CM

## 2018-11-16 DIAGNOSIS — M48061 Spinal stenosis, lumbar region without neurogenic claudication: Secondary | ICD-10-CM | POA: Diagnosis not present

## 2018-11-16 DIAGNOSIS — M5416 Radiculopathy, lumbar region: Secondary | ICD-10-CM

## 2018-11-19 ENCOUNTER — Other Ambulatory Visit (INDEPENDENT_AMBULATORY_CARE_PROVIDER_SITE_OTHER): Payer: Self-pay | Admitting: Physical Medicine and Rehabilitation

## 2018-11-20 ENCOUNTER — Other Ambulatory Visit (INDEPENDENT_AMBULATORY_CARE_PROVIDER_SITE_OTHER): Payer: Self-pay | Admitting: Physical Medicine and Rehabilitation

## 2018-11-20 ENCOUNTER — Ambulatory Visit (INDEPENDENT_AMBULATORY_CARE_PROVIDER_SITE_OTHER): Payer: PPO | Admitting: Physical Medicine and Rehabilitation

## 2018-11-20 ENCOUNTER — Telehealth (INDEPENDENT_AMBULATORY_CARE_PROVIDER_SITE_OTHER): Payer: Self-pay | Admitting: Physical Medicine and Rehabilitation

## 2018-11-20 MED ORDER — DULOXETINE HCL 20 MG PO CPEP: 20.0000 mg | ORAL_CAPSULE | Freq: Every day | ORAL | 3 refills | Status: DC

## 2018-11-20 NOTE — Progress Notes (Signed)
Duloxetine for radiculopathy and PN.

## 2018-11-21 NOTE — Telephone Encounter (Signed)
I have had a patient with same and they were ok to take but if he does not want to that is ok, is there any pain medication he has tolerated?

## 2018-11-21 NOTE — Telephone Encounter (Signed)
Patient states that he was taking Tylenol, but he isn't taking that now. He states that he would rather put up with the pain than risk side effects right now. He states that his pain has decreased some, so he wants to wait until he can have the injection.

## 2018-11-21 NOTE — Telephone Encounter (Signed)
Tylenol up 650mg  TID if needed

## 2018-11-22 DIAGNOSIS — L97519 Non-pressure chronic ulcer of other part of right foot with unspecified severity: Secondary | ICD-10-CM | POA: Diagnosis not present

## 2018-11-22 NOTE — Progress Notes (Signed)
[name] [name]. - 85 y.o. male MRN [id number]  Date of birth: 12/02/1922  Office Visit Note: Visit Date: 08/30/2018 PCP: [name], DO Referred by: [name], DO  Subjective: Chief Complaint  Patient presents with  . Left Thigh - Pain   HPI:  [name] [name]. is a 85 y.o. male who comes in today With chronic severe recalcitrant left hip and leg pain which has been felt to be an L5 radiculopathy.  Please see our prior notes for details of injection treatment.  Left L5 transforaminal injection in September gave him almost complete relief for a while and he was very happy with those results.  Unfortunately since that time repeated L5 injection intermittently has helped to some degree but not as much as it did the first time.  He is still having pain into the left thigh and some into the calf but the calf pain is better at this point.  He still rates his pain as an 8 out of 10.  He does not tolerate medications well and does not want to have anything that makes him dizzy or prone to fall.  He still ambulates with a rolling walker which is been more persistent than it used to be.  His pain is intermittent dull and aching worse with walking and standing and better at rest.  No right-sided complaints.  Some back pain that was relieved with facet joint block but his main issue has been his leg pain.  He has had no focal weakness or foot drop.  No other red flag complaints.  Has been using Tylenol.  Review of Systems  Constitutional: Negative for chills, fever, malaise/fatigue and weight loss.  HENT: Negative for hearing loss and sinus pain.   Eyes: Negative for blurred vision, double vision and photophobia.  Respiratory: Negative for cough and shortness of breath.   Cardiovascular: Negative for chest pain, palpitations and leg swelling.  Gastrointestinal: Negative for abdominal pain, nausea and vomiting.  Genitourinary: Negative for flank pain.  Musculoskeletal: Positive for back  pain. Negative for myalgias.       Left hip and leg pain  Skin: Negative for itching and rash.  Neurological: Negative for tremors, focal weakness and weakness.  Endo/Heme/Allergies: Negative.   Psychiatric/Behavioral: Negative for depression.  All other systems reviewed and are negative.  Otherwise per HPI.  Assessment & Plan: Visit Diagnoses:  1. Lumbar radiculopathy   2. Spinal stenosis of lumbar region with neurogenic claudication   3. Foraminal stenosis of lumbar region     Plan: Findings:  Persistent recalcitrant left hip and leg pain consistent more with an L5 radiculopathy.  MRI from a couple of years ago shows moderate stenosis centrally at L4-5 but with facet arthropathy at L5-S1 with some narrowing of the foramen on the left particularly with small what appears to be facet joint cyst.  I think given the fact that the last L5 injection just was not as beneficial as the one prior and it looks like there is good flow of contrast we should try at least that injection will relook at both an L4 and L5 transforaminal injection to get medication above and below this area and also the area where the lateral recess and central canal narrowing is.  Hopefully this will help for quite a while.  Again if we get him enough relief that lasts for several months we do not mind repeating these injections from time to time.  In the future will  consider updated MRI versus medication type management.    Meds & Orders: No orders of the defined types were placed in this encounter.  No orders of the defined types were placed in this encounter.   Follow-up: Return for Left L4 and L5 transforaminal epidural steroid injection.   Procedures: No procedures performed  No notes on file   Clinical History: Lumbar spine MRI dated 2018 is completed at [facility].  At L4-5 there is moderate multifactorial stenosis with foraminal narrowing and disc protrusion on the left.  At L5-S1 there is facet  arthropathy which is pretty significant bilaterally with facet joint cyst on the left.     Objective:  VS:  HT:5\' 11"  (180.3 cm)   WT:174 lb (78.9 kg)  BMI:24.28    BP:(!) 142/77  HR:(!) 59bpm  TEMP: ( )  RESP:99 % Physical Exam Vitals signs and nursing note reviewed.  Constitutional:      General: He is not in acute distress.    Appearance: He is well-developed.  HENT:     Head: Normocephalic and atraumatic.     Nose: Nose normal.     Mouth/Throat:     Mouth: Mucous membranes are moist.     Pharynx: Oropharynx is clear.  Eyes:     Conjunctiva/sclera: Conjunctivae normal.     Pupils: Pupils are equal, round, and reactive to light.  Neck:     Musculoskeletal: Normal range of motion and neck supple.     Trachea: No tracheal deviation.  Cardiovascular:     Rate and Rhythm: Normal rate and regular rhythm.     Pulses: Normal pulses.  Pulmonary:     Effort: Pulmonary effort is normal.     Breath sounds: Normal breath sounds.  Abdominal:     General: There is no distension.     Palpations: Abdomen is soft.     Tenderness: There is no guarding or rebound.  Musculoskeletal:        General: No deformity.     Right lower leg: Edema present.     Left lower leg: Edema present.     Comments: Patient ambulates with rolling walker.  He has a negative slump test bilaterally although it is equivocally positive on the left with tight hamstring.  No pain over the greater trochanter.  No pain over the fibular heads bilaterally.  He has no clonus and good strength in the lower extremities.  Skin:    General: Skin is warm and dry.     Findings: No erythema or rash.  Neurological:     General: No focal deficit present.     Mental Status: He is alert and oriented to person, place, and time.     Motor: No abnormal muscle tone.     Coordination: Coordination normal.     Gait: Gait normal.  Psychiatric:        Mood and Affect: Mood normal.        Behavior: Behavior normal.        Thought  Content: Thought content normal.     Ortho Exam Imaging: No results found.

## 2018-12-12 ENCOUNTER — Ambulatory Visit (INDEPENDENT_AMBULATORY_CARE_PROVIDER_SITE_OTHER): Payer: Self-pay | Admitting: Physical Medicine and Rehabilitation

## 2018-12-23 ENCOUNTER — Encounter: Payer: Self-pay | Admitting: Internal Medicine

## 2018-12-24 ENCOUNTER — Encounter (INDEPENDENT_AMBULATORY_CARE_PROVIDER_SITE_OTHER): Payer: PPO | Admitting: Physical Medicine and Rehabilitation

## 2019-01-02 IMAGING — MR MR FOOT*R* WO/W CM
5 of 9 series · 20 of 40 positions shown · IV contrast (Yes)
Comparison: None.

CLINICAL DATA: Cellulitis of right lower extremity. Ulcer on right
foot near toes pt has had for months.

EXAM:
MRI OF THE RIGHT FOREFOOT WITHOUT AND WITH CONTRAST
TECHNIQUE: Multiplanar, multisequence MR imaging of the right foot was
performed before and after the administration of intravenous
contrast.
CONTRAST:  17mL MULTIHANCE GADOBENATE DIMEGLUMINE 529 MG/ML IV SOLN

[Series 5: T1 · coronal · 4.0mm · 0.29mm/px · 6 of 34 slices shown (1 of 2)]
[im 1/34]
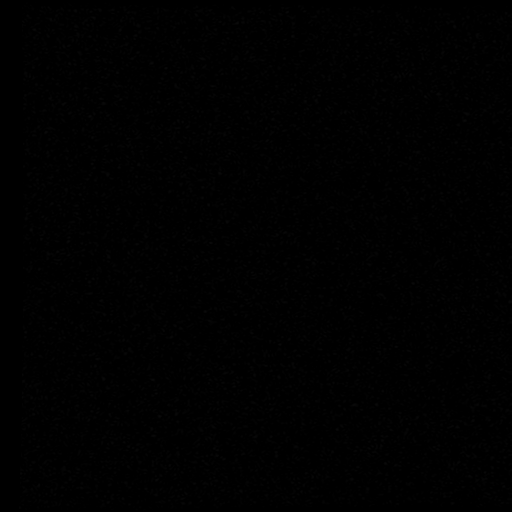
[im 7/34]
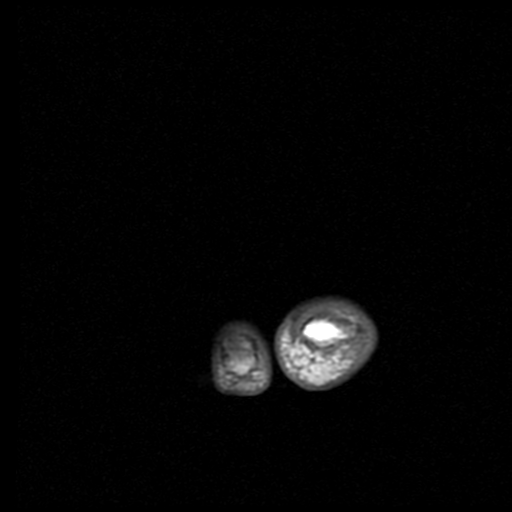
[im 14/34]
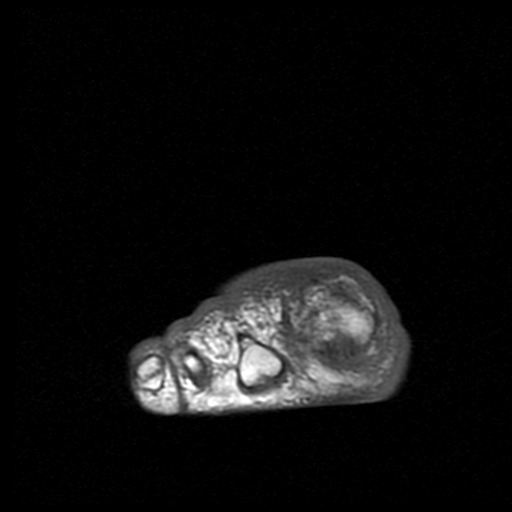
[im 20/34]
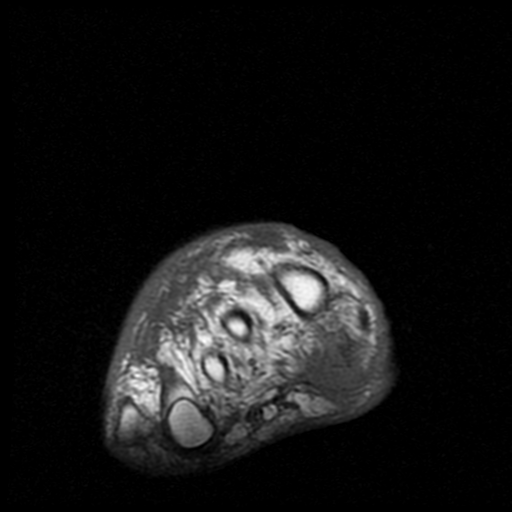
[im 27/34]
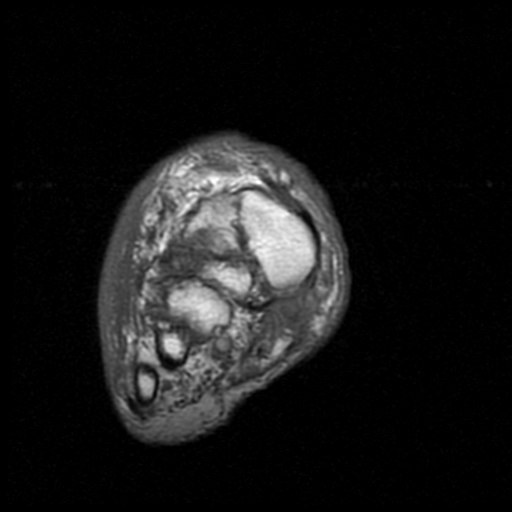
[im 34/34]
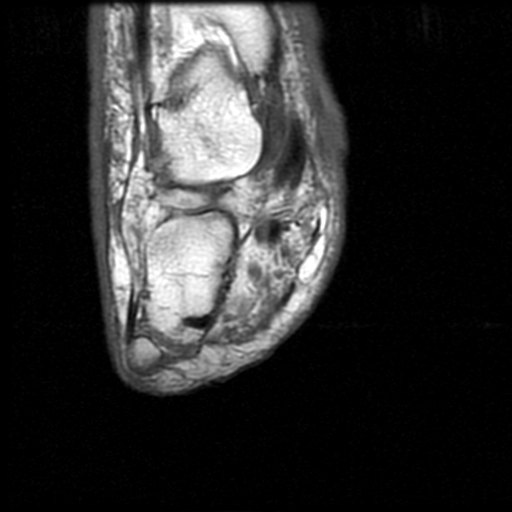

[Series 8: T1 · axial · 4.0mm · 0.70mm/px · z∈[-81,+16]mm · 3 of 21 slices shown (2 of 2)]
[im 1/21]
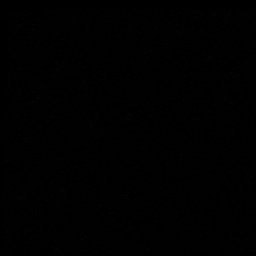
[im 11/21]
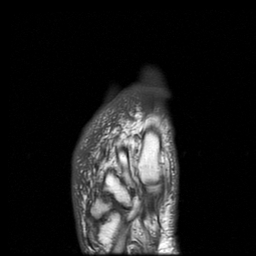
[im 21/21]
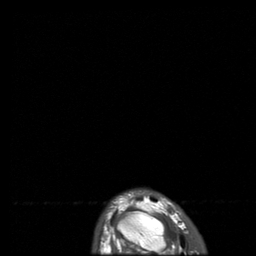

[Series 10: T1 fat-sat post-contrast · coronal · 4.0mm · 0.29mm/px · 6 of 34 slices shown]
[im 1/34]
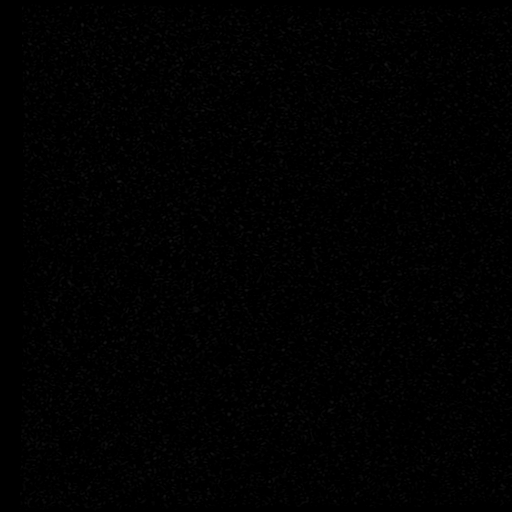
[im 7/34]
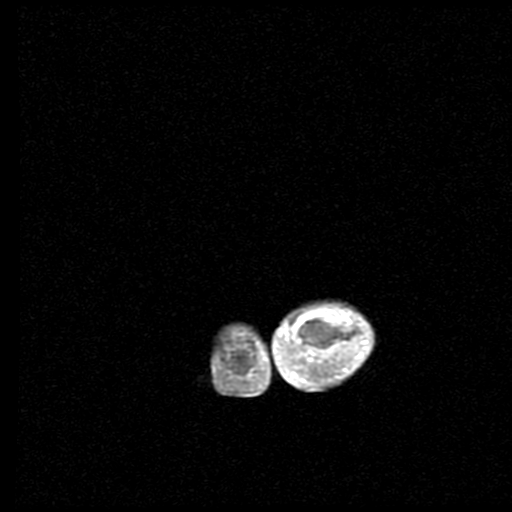
[im 14/34]
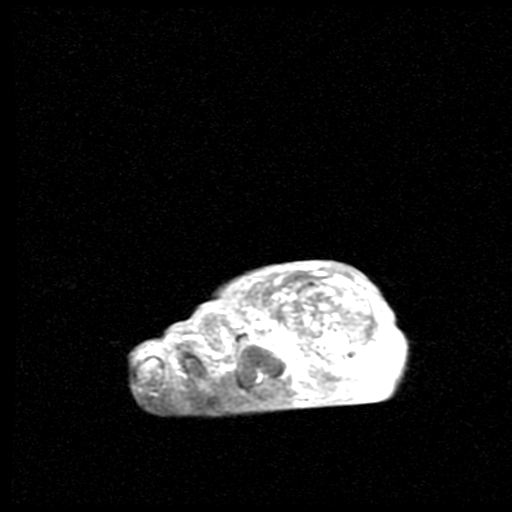
[im 20/34]
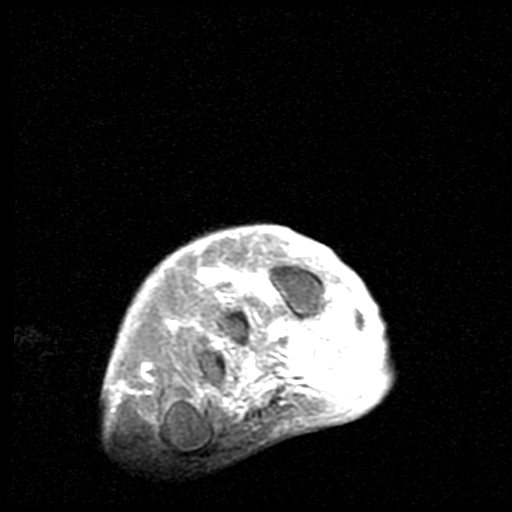
[im 27/34]
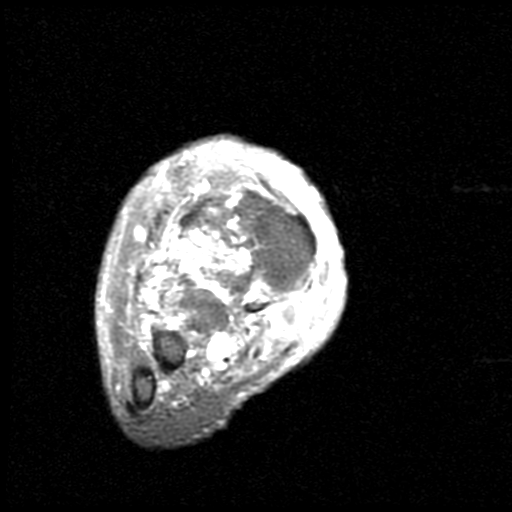
[im 34/34]
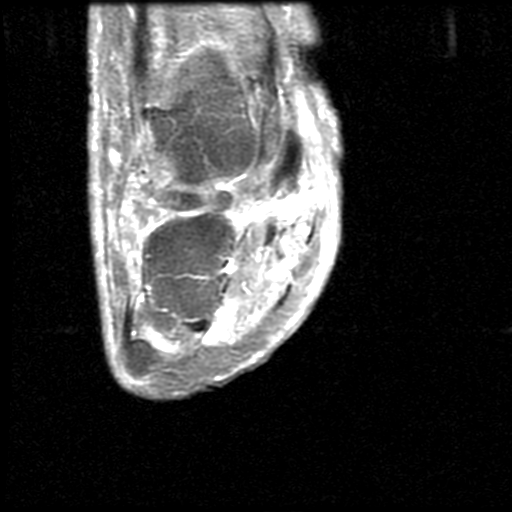

[Series 11: T1 post-contrast · axial · 4.0mm · 0.35mm/px · z∈[-81,+16]mm · 3 of 21 slices shown (1 of 2)]
[im 1/21]
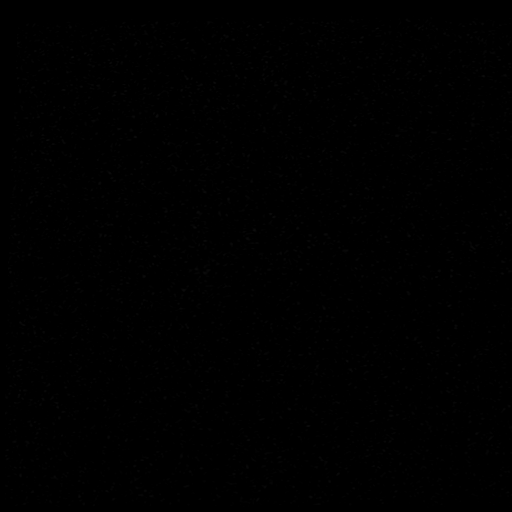
[im 11/21]
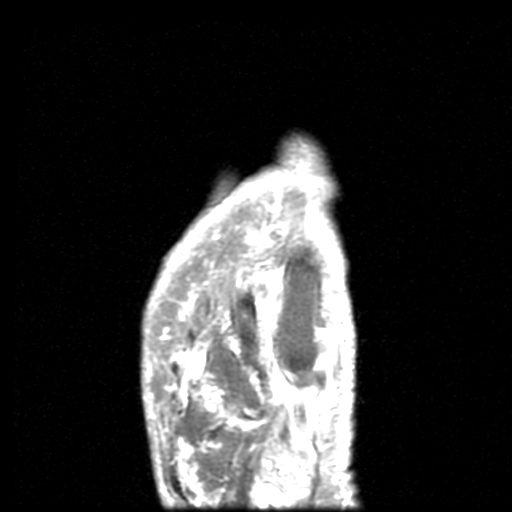
[im 21/21]
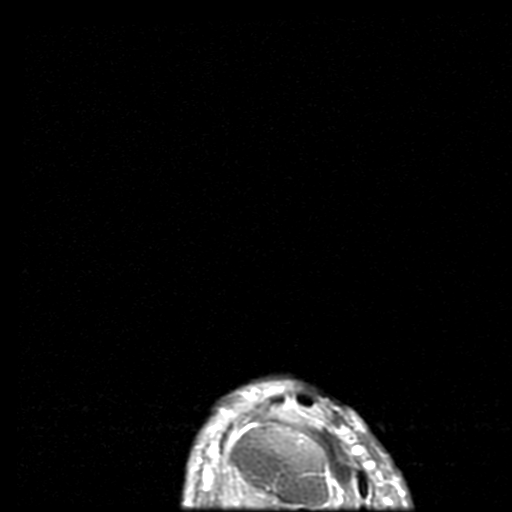

[Series 12: T1 post-contrast · sagittal · 4.0mm · 0.35mm/px · 2 of 21 slices shown (2 of 2)]
[im 1/21]
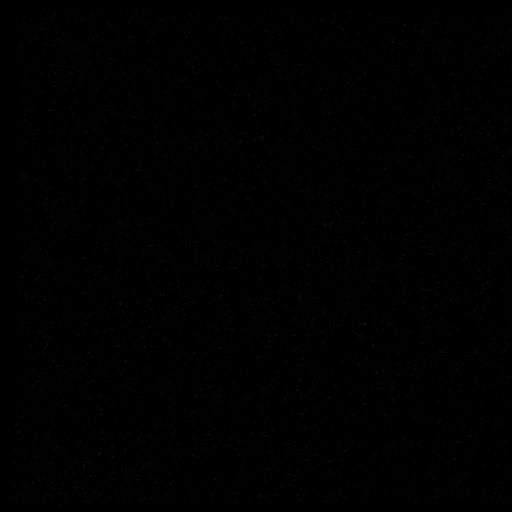
[im 11/21]
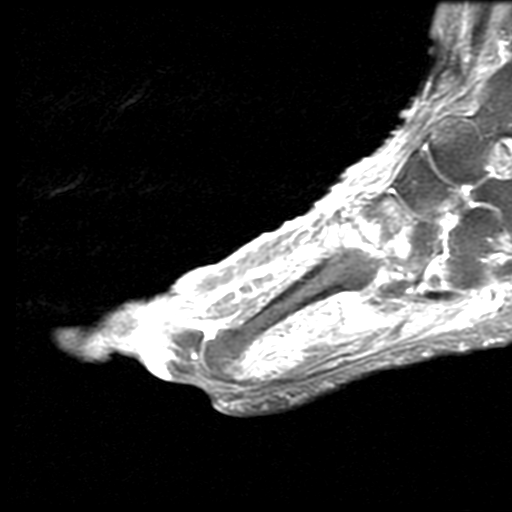

[20 of 40 positions shown; findings below may reference images not displayed]

FINDINGS: Bones/Joint/Cartilage

Soft tissue ulcer along the plantar aspect of the first metatarsal
head with a sinus tract extending to the lateral hallux sesamoid.
Severe soft tissue edema and enhancement of the soft tissues
overlying the first MTP joint most concerning for cellulitis.
Cortical destruction of lateral hallux sesamoid with severe marrow
edema and enhancement. Marrow edema of medial hallux sesamoid
without definite cortical destruction or T1 signal abnormality.

Severe joint space narrowing with full-thickness cartilage loss and
subchondral cystic changes involving the first MTP joint consistent
with severe osteoarthritis.

No bone destruction or periosteal reaction. No fracture or
dislocation. Normal alignment. No joint effusion.

Ligaments

Collateral ligaments are intact.  Lisfranc ligament is intact.

Muscles and Tendons
Severe tendinosis of flexor digitorum longus with a high-grade
partial-thickness tear at the level of the first MTP joint.
Generalized muscle atrophy.

Soft tissue
No fluid collection or hematoma.  No soft tissue mass.
IMPRESSION: 1. Soft tissue ulcer along the plantar aspect of the first
metatarsal head with a sinus tract extending to the lateral hallux
sesamoid. Severe soft tissue edema and enhancement of the soft
tissues overlying the first MTP joint most concerning for
cellulitis. Cortical destruction of lateral hallux sesamoid with
severe marrow edema and enhancement concerning for osteomyelitis.
Marrow edema of medial hallux sesamoid without definite cortical
destruction or T1 signal abnormality concerning for mild
osteomyelitis versus reactive marrow edema secondary to adjacent
inflammation.
2. Severe joint space narrowing with full-thickness cartilage loss
and subchondral cystic changes involving the first MTP joint
consistent with severe osteoarthritis which may related to prior
septic arthritis.
3. Severe tendinosis of flexor digitorum longus with a high-grade
partial-thickness tear at the level of the first MTP joint.

## 2019-01-10 ENCOUNTER — Ambulatory Visit: Payer: PPO

## 2019-01-10 ENCOUNTER — Ambulatory Visit (INDEPENDENT_AMBULATORY_CARE_PROVIDER_SITE_OTHER): Payer: PPO | Admitting: Physical Medicine and Rehabilitation

## 2019-01-10 ENCOUNTER — Encounter: Payer: Self-pay | Admitting: Physical Medicine and Rehabilitation

## 2019-01-10 ENCOUNTER — Other Ambulatory Visit: Payer: Self-pay

## 2019-01-10 VITALS — BP 151/78 | HR 78

## 2019-01-10 DIAGNOSIS — M5416 Radiculopathy, lumbar region: Secondary | ICD-10-CM | POA: Diagnosis not present

## 2019-01-10 MED ORDER — BETAMETHASONE SOD PHOS & ACET 6 (3-3) MG/ML IJ SUSP
12.0000 mg | Freq: Once | INTRAMUSCULAR | Status: AC
  Administered 2019-01-10: 12 mg

## 2019-01-10 NOTE — Progress Notes (Signed)
 .  Numeric Pain Rating Scale and Functional Assessment Average Pain 6   In the last MONTH (on 0-10 scale) has pain interfered with the following?  1. General activity like being  able to carry out your everyday physical activities such as walking, climbing stairs, carrying groceries, or moving a chair?  Rating(7)   +Driver, -BT, -Dye Allergies.  

## 2019-02-05 DIAGNOSIS — Z85828 Personal history of other malignant neoplasm of skin: Secondary | ICD-10-CM | POA: Diagnosis not present

## 2019-02-05 DIAGNOSIS — D485 Neoplasm of uncertain behavior of skin: Secondary | ICD-10-CM | POA: Diagnosis not present

## 2019-02-05 DIAGNOSIS — L820 Inflamed seborrheic keratosis: Secondary | ICD-10-CM | POA: Diagnosis not present

## 2019-02-05 DIAGNOSIS — L570 Actinic keratosis: Secondary | ICD-10-CM | POA: Diagnosis not present

## 2019-02-07 ENCOUNTER — Ambulatory Visit: Payer: Self-pay

## 2019-02-07 ENCOUNTER — Encounter: Payer: Self-pay | Admitting: Physical Medicine and Rehabilitation

## 2019-02-07 ENCOUNTER — Other Ambulatory Visit: Payer: Self-pay

## 2019-02-07 ENCOUNTER — Ambulatory Visit (INDEPENDENT_AMBULATORY_CARE_PROVIDER_SITE_OTHER): Payer: PPO | Admitting: Physical Medicine and Rehabilitation

## 2019-02-07 VITALS — BP 153/79 | HR 81

## 2019-02-07 DIAGNOSIS — M5416 Radiculopathy, lumbar region: Secondary | ICD-10-CM

## 2019-02-07 DIAGNOSIS — M48062 Spinal stenosis, lumbar region with neurogenic claudication: Secondary | ICD-10-CM

## 2019-02-07 DIAGNOSIS — R202 Paresthesia of skin: Secondary | ICD-10-CM

## 2019-02-07 DIAGNOSIS — M48061 Spinal stenosis, lumbar region without neurogenic claudication: Secondary | ICD-10-CM

## 2019-02-07 MED ORDER — BETAMETHASONE SOD PHOS & ACET 6 (3-3) MG/ML IJ SUSP
12.0000 mg | Freq: Once | INTRAMUSCULAR | Status: AC
  Administered 2019-02-07: 12 mg

## 2019-02-07 NOTE — Progress Notes (Signed)
 .  Numeric Pain Rating Scale and Functional Assessment Average Pain 8   In the last MONTH (on 0-10 scale) has pain interfered with the following?  1. General activity like being  able to carry out your everyday physical activities such as walking, climbing stairs, carrying groceries, or moving a chair?  Rating(7)   +Driver, -BT, -Dye Allergies.  

## 2019-02-07 NOTE — Procedures (Signed)
Lumbosacral Transforaminal Epidural Steroid Injection - Sub-Pedicular Approach with Fluoroscopic Guidance  Patient: [name] [name].      Date of Birth: [birth year] MRN: [id number] PCP: [name], DO      Visit Date: 01/10/2019   Universal Protocol:    Date/Time: 01/10/2019  Consent Given By: the patient  Position: PRONE  Additional Comments: Vital signs were monitored before and after the procedure. Patient was prepped and draped in the usual sterile fashion. The correct patient, procedure, and site was verified.   Injection Procedure Details:  Procedure Site One Meds Administered:  Meds ordered this encounter  Medications  . betamethasone acetate-betamethasone sodium phosphate (CELESTONE) injection 12 mg    Laterality: Left  Location/Site:  L5-S1  Needle size: 22 G  Needle type: Spinal  Needle Placement: Transforaminal  Findings:    -Comments: Excellent flow of contrast along the nerve and into the epidural space.  Procedure Details: After squaring off the end-plates to get a true AP view, the C-arm was positioned so that an oblique view of the foramen as noted above was visualized. The target area is just inferior to the "nose of the scotty dog" or sub pedicular. The soft tissues overlying this structure were infiltrated with 2-3 ml. of 1% Lidocaine without Epinephrine.  The spinal needle was inserted toward the target using a "trajectory" view along the fluoroscope beam.  Under AP and lateral visualization, the needle was advanced so it did not puncture dura and was located close the 6 O'Clock position of the pedical in AP tracterory. Biplanar projections were used to confirm position. Aspiration was confirmed to be negative for CSF and/or blood. A 1-2 ml. volume of Isovue-250 was injected and flow of contrast was noted at each level. Radiographs were obtained for documentation purposes.   After attaining the desired flow of contrast documented above, a 0.5  to 1.0 ml test dose of 0.25% Marcaine was injected into each respective transforaminal space.  The patient was observed for 90 seconds post injection.  After no sensory deficits were reported, and normal lower extremity motor function was noted,   the above injectate was administered so that equal amounts of the injectate were placed at each foramen (level) into the transforaminal epidural space.   Additional Comments:  The patient tolerated the procedure well Dressing: 2 x 2 sterile gauze and Band-Aid    Post-procedure details: Patient was observed during the procedure. Post-procedure instructions were reviewed.  Patient left the clinic in stable condition.

## 2019-02-07 NOTE — Progress Notes (Signed)
Kainon Varady. - 85 y.o. male MRN [id number]  Date of birth: [birth year]  Office Visit Note: Visit Date: 01/10/2019 PCP: [name], DO Referred by: [name], DO  Subjective: Chief Complaint  Patient presents with  . Left Hip - Pain  . Left Leg - Pain   HPI:  Akil Hoos. is a 85 y.o. male who comes in today For planned left L5 transforaminal epidural steroid injection for chronic worsening severe at times pretty classic L5 distribution pain with paresthesia.  Patient has moderate multifactorial stenosis at L4-5 with facet arthropathy of L5-S1 with small facet joint cyst that may impact the left L5 nerve root.  Prior injections have been problematic only if the did get significant relief with at least 2 injections one lasting 3 months and one not lasting as long and then the last prior injection was not as beneficial but did last a couple weeks.  I think at this point we will repeat this and I will see him back in a couple weeks to repeat it once and just see if we can get this to calm down.  He really has tried other medications without any relief.  He is unable to take Robaxin or Percocet.  We did have him on Lyrica that seem to be helping a little bit but then he had foot swelling and we had to stop that.  ROS Otherwise per HPI.  Assessment & Plan: Visit Diagnoses:  1. Lumbar radiculopathy     Plan: No additional findings.   Meds & Orders:  Meds ordered this encounter  Medications  . betamethasone acetate-betamethasone sodium phosphate (CELESTONE) injection 12 mg    Orders Placed This Encounter  Procedures  . XR C-ARM NO REPORT  . Epidural Steroid injection    Follow-up: Return in about 4 weeks (around 02/07/2019) for Repeat if needed.   Procedures: No procedures performed  Lumbosacral Transforaminal Epidural Steroid Injection - Sub-Pedicular Approach with Fluoroscopic Guidance  Patient: Lenn Volker.      Date of Birth: [birth year] MRN:  [id number] PCP: [name], DO      Visit Date: 01/10/2019   Universal Protocol:    Date/Time: 01/10/2019  Consent Given By: the patient  Position: PRONE  Additional Comments: Vital signs were monitored before and after the procedure. Patient was prepped and draped in the usual sterile fashion. The correct patient, procedure, and site was verified.   Injection Procedure Details:  Procedure Site One Meds Administered:  Meds ordered this encounter  Medications  . betamethasone acetate-betamethasone sodium phosphate (CELESTONE) injection 12 mg    Laterality: Left  Location/Site:  L5-S1  Needle size: 22 G  Needle type: Spinal  Needle Placement: Transforaminal  Findings:    -Comments: Excellent flow of contrast along the nerve and into the epidural space.  Procedure Details: After squaring off the end-plates to get a true AP view, the C-arm was positioned so that an oblique view of the foramen as noted above was visualized. The target area is just inferior to the "nose of the scotty dog" or sub pedicular. The soft tissues overlying this structure were infiltrated with 2-3 ml. of 1% Lidocaine without Epinephrine.  The spinal needle was inserted toward the target using a "trajectory" view along the fluoroscope beam.  Under AP and lateral visualization, the needle was advanced so it did not puncture dura and was located close the 6 O'Clock position of the pedical in AP tracterory. Biplanar projections  were used to confirm position. Aspiration was confirmed to be negative for CSF and/or blood. A 1-2 ml. volume of Isovue-250 was injected and flow of contrast was noted at each level. Radiographs were obtained for documentation purposes.   After attaining the desired flow of contrast documented above, a 0.5 to 1.0 ml test dose of 0.25% Marcaine was injected into each respective transforaminal space.  The patient was observed for 90 seconds post injection.  After no sensory  deficits were reported, and normal lower extremity motor function was noted,   the above injectate was administered so that equal amounts of the injectate were placed at each foramen (level) into the transforaminal epidural space.   Additional Comments:  The patient tolerated the procedure well Dressing: 2 x 2 sterile gauze and Band-Aid    Post-procedure details: Patient was observed during the procedure. Post-procedure instructions were reviewed.  Patient left the clinic in stable condition.     Clinical History: Lumbar spine MRI dated 2018 is completed at [facility].  At L4-5 there is moderate multifactorial stenosis with foraminal narrowing and disc protrusion on the left.  At L5-S1 there is facet arthropathy which is pretty significant bilaterally with facet joint cyst on the left.     Objective:  VS:  HT:    WT:   BMI:     BP:(!) 151/78  HR:78bpm  TEMP: ( )  RESP:  Physical Exam  Ortho Exam Imaging: No results found.

## 2019-02-12 ENCOUNTER — Telehealth: Payer: Self-pay | Admitting: Radiology

## 2019-02-12 NOTE — Telephone Encounter (Signed)
Pt states that he got the TENS unit but wants to know how long he should wear it at a time. Please advise

## 2019-02-13 NOTE — Telephone Encounter (Signed)
Sometimes direction say 1 hour or 30 minutes but truth is he can wear it as long as it helps. If it helps a while and seems to stop then restart and change "waveform". It is trial and error. Glad he is trying though.

## 2019-02-13 NOTE — Telephone Encounter (Signed)
I called and advised patient of message below.  

## 2019-02-21 DIAGNOSIS — E230 Hypopituitarism: Secondary | ICD-10-CM | POA: Diagnosis not present

## 2019-02-21 DIAGNOSIS — E7800 Pure hypercholesterolemia, unspecified: Secondary | ICD-10-CM | POA: Diagnosis not present

## 2019-02-25 ENCOUNTER — Encounter: Payer: Self-pay | Admitting: Physical Medicine and Rehabilitation

## 2019-02-25 DIAGNOSIS — D510 Vitamin B12 deficiency anemia due to intrinsic factor deficiency: Secondary | ICD-10-CM | POA: Diagnosis not present

## 2019-02-25 DIAGNOSIS — M48062 Spinal stenosis, lumbar region with neurogenic claudication: Secondary | ICD-10-CM | POA: Insufficient documentation

## 2019-02-25 DIAGNOSIS — M48061 Spinal stenosis, lumbar region without neurogenic claudication: Secondary | ICD-10-CM | POA: Insufficient documentation

## 2019-02-25 DIAGNOSIS — I10 Essential (primary) hypertension: Secondary | ICD-10-CM | POA: Diagnosis not present

## 2019-02-25 DIAGNOSIS — E230 Hypopituitarism: Secondary | ICD-10-CM | POA: Diagnosis not present

## 2019-02-25 DIAGNOSIS — M5416 Radiculopathy, lumbar region: Secondary | ICD-10-CM | POA: Insufficient documentation

## 2019-02-25 DIAGNOSIS — E7800 Pure hypercholesterolemia, unspecified: Secondary | ICD-10-CM | POA: Diagnosis not present

## 2019-02-25 DIAGNOSIS — E559 Vitamin D deficiency, unspecified: Secondary | ICD-10-CM | POA: Diagnosis not present

## 2019-02-25 NOTE — Procedures (Signed)
S1 Lumbosacral Transforaminal Epidural Steroid Injection - Sub-Pedicular Approach with Fluoroscopic Guidance   Patient: [name] [name].      Date of Birth: 07-07-1923 MRN: [id number] PCP: [name], DO      Visit Date: 02/07/2019   Universal Protocol:    Date/Time: 06/29/205:25 AM  Consent Given By: the patient  Position:  PRONE  Additional Comments: Vital signs were monitored before and after the procedure. Patient was prepped and draped in the usual sterile fashion. The correct patient, procedure, and site was verified.   Injection Procedure Details:  Procedure Site One Meds Administered:  Meds ordered this encounter  Medications  . betamethasone acetate-betamethasone sodium phosphate (CELESTONE) injection 12 mg    Laterality: Left  Location/Site:  S1 Foramen   Needle size: 22 ga.  Needle type: Spinal  Needle Placement: Transforaminal  Findings:   -Comments: Excellent flow of contrast along the nerve and into the epidural space.  Procedure Details: After squaring off the sacral end-plate to get a true AP view, the C-arm was positioned so that the best possible view of the S1 foramen was visualized. The soft tissues overlying this structure were infiltrated with 2-3 ml. of 1% Lidocaine without Epinephrine.    The spinal needle was inserted toward the target using a "trajectory" view along the fluoroscope beam.  Under AP and lateral visualization, the needle was advanced so it did not puncture dura. Biplanar projections were used to confirm position. Aspiration was confirmed to be negative for CSF and/or blood. A 1-2 ml. volume of Isovue-250 was injected and flow of contrast was noted at each level. Radiographs were obtained for documentation purposes.   After attaining the desired flow of contrast documented above, a 0.5 to 1.0 ml test dose of 0.25% Marcaine was injected into each respective transforaminal space.  The patient was observed for 90 seconds post  injection.  After no sensory deficits were reported, and normal lower extremity motor function was noted,   the above injectate was administered so that equal amounts of the injectate were placed at each foramen (level) into the transforaminal epidural space.   Additional Comments:  The patient tolerated the procedure well Dressing: Band-Aid with 2 x 2 sterile gauze    Post-procedure details: Patient was observed during the procedure. Post-procedure instructions were reviewed.  Patient left the clinic in stable condition.

## 2019-02-25 NOTE — Progress Notes (Signed)
[name] - 85 y.o. male MRN [id number]  Date of birth: Nov 06, 1922  Office Visit Note: Visit Date: 02/07/2019 PCP: [name], DO Referred by: [name], DO  Subjective: Chief Complaint  Patient presents with   Lower Back - Pain   Left Leg - Pain   HPI: [name] [name]. is a 85 y.o. male who comes in today For patient and management of low back left hip and leg pain with particular pain into the left posterior lateral thigh and posterior lateral lower leg.  In fact the lower leg really is what bothers him the most.  He has history as well reviewed in our prior notes and unfortunately has been a little bit of a tricky problem getting him any relief.  He comes in today a few weeks status post repeat L5 transforaminal injection that we held off on because he just did not get as much relief with the one prior to that.  Once again he has not really gotten much relief with that injection and it looks like good epic flow of contrast.  Initially I saw him with MRI from [facility] and had seen Dr. Nelva Bush for 2 epidural injections which were not very beneficial.  He has foraminal stenosis at L5 related to facet arthropathy at L5-S1 he also has moderate narrowing centrally at L4-5.  We have updated new MRI to make sure there was nothing new going on.  This still shows the same issue with mainly foraminal narrowing I think is the culprit at L5 could be lateral recess narrowing at the same level Duda facet arthritis.  Epidural injections from an interlaminar approach were not very successful and facet joint block seem to help his back pain a little bit but not his leg pain.  At least 1 of the L5 transforaminal injection seem to help his leg pain greatly but we really have not achieved any results since then.  He tolerated Lyrica which seemed to help but then he started to get swelling so with this is been discontinued.  He does not really do well with other pain medications  although we have given him some on occasion.  He has a history of pituitary adenoma resection and is on replacement for related hormones.  He is diabetic.  He does have neuropathy.  He does not endorse the same kind of pain on the right however.  He still exercises and we have talked about doing squats for strengthening and balance and he continues to do those.  We started him on Cymbalta/duloxetine but he says he never took it because the bottle said not to take it with glaucoma.  His pain is worse with walking and standing sitting helps.  He has had no new focal weakness or new injury.  Review of Systems  Constitutional: Negative for chills, fever, malaise/fatigue and weight loss.  HENT: Negative for hearing loss and sinus pain.   Eyes: Negative for blurred vision, double vision and photophobia.  Respiratory: Negative for cough and shortness of breath.   Cardiovascular: Negative for chest pain, palpitations and leg swelling.  Gastrointestinal: Negative for abdominal pain, nausea and vomiting.  Genitourinary: Negative for flank pain.  Musculoskeletal: Negative for myalgias.  Skin: Negative for itching and rash.  Neurological: Negative for tremors, focal weakness and weakness.  Endo/Heme/Allergies: Negative.   Psychiatric/Behavioral: Negative for depression.  All other systems reviewed and are negative.  Otherwise per HPI.  Assessment & Plan: Visit Diagnoses:  1. Lumbar radiculopathy   2. Spinal stenosis of lumbar region with neurogenic claudication   3. Paresthesia of skin   4. Foraminal stenosis of lumbar region     Plan: Findings:  Continued severe worsening left radicular leg pain and a pretty classic L5 or S1 distribution and dermatomal pattern.  He has decreased sensation in the same pattern but he has good distal strength.  He ambulates with a walker.  He continues to do exercises to stay strong.  Case is complicated by hormonal therapy including small dose of prednisone for  pituitary adenoma resection historically.  1 injection that we did help tremendously and we really have not gotten back to that level.  At this point we discussed alternative treatments although I am going to do one more injection and I am going to try an S1 transforaminal approach.  We have not done this and at least is worth a try if he gets some relief.  We did discuss the use of Cymbalta and glaucoma and have had patients that could take it we just need to get clearance from her ophthalmologist.  Right now we will hold off on that.  He may be a candidate for something like Topamax as he was doing okay on Lyrica but did not tolerate swelling.  We also talked about a TENS unit.  He will obtain a TENS unit.  Consideration should be given to pulsed radiofrequency of the L5 nerve root.  This is something that could safely be done and if it gives him any longer term relief that it would be worth it.    Meds & Orders:  Meds ordered this encounter  Medications   betamethasone acetate-betamethasone sodium phosphate (CELESTONE) injection 12 mg    Orders Placed This Encounter  Procedures   XR C-ARM NO REPORT   Epidural Steroid injection    Follow-up: Return if symptoms worsen or fail to improve.   Procedures: No procedures performed  S1 Lumbosacral Transforaminal Epidural Steroid Injection - Sub-Pedicular Approach with Fluoroscopic Guidance   Patient: [name] [name].      Date of Birth: 12/19/1922 MRN: [id number] PCP: [name], DO      Visit Date: 02/07/2019   Universal Protocol:    Date/Time: 06/29/205:25 AM  Consent Given By: the patient  Position:  PRONE  Additional Comments: Vital signs were monitored before and after the procedure. Patient was prepped and draped in the usual sterile fashion. The correct patient, procedure, and site was verified.   Injection Procedure Details:  Procedure Site One Meds Administered:  Meds ordered this encounter  Medications    betamethasone acetate-betamethasone sodium phosphate (CELESTONE) injection 12 mg    Laterality: Left  Location/Site:  S1 Foramen   Needle size: 22 ga.  Needle type: Spinal  Needle Placement: Transforaminal  Findings:   -Comments: Excellent flow of contrast along the nerve and into the epidural space.  Procedure Details: After squaring off the sacral end-plate to get a true AP view, the C-arm was positioned so that the best possible view of the S1 foramen was visualized. The soft tissues overlying this structure were infiltrated with 2-3 ml. of 1% Lidocaine without Epinephrine.    The spinal needle was inserted toward the target using a "trajectory" view along the fluoroscope beam.  Under AP and lateral visualization, the needle was advanced so it did not puncture dura. Biplanar projections were used to confirm position. Aspiration was confirmed to be negative for CSF and/or blood. A 1-2  ml. volume of Isovue-250 was injected and flow of contrast was noted at each level. Radiographs were obtained for documentation purposes.   After attaining the desired flow of contrast documented above, a 0.5 to 1.0 ml test dose of 0.25% Marcaine was injected into each respective transforaminal space.  The patient was observed for 90 seconds post injection.  After no sensory deficits were reported, and normal lower extremity motor function was noted,   the above injectate was administered so that equal amounts of the injectate were placed at each foramen (level) into the transforaminal epidural space.   Additional Comments:  The patient tolerated the procedure well Dressing: Band-Aid with 2 x 2 sterile gauze    Post-procedure details: Patient was observed during the procedure. Post-procedure instructions were reviewed.  Patient left the clinic in stable condition.    Clinical History: Lumbar spine MRI dated 2018 is completed at [facility].  At L4-5 there is moderate multifactorial  stenosis with foraminal narrowing and disc protrusion on the left.  At L5-S1 there is facet arthropathy which is pretty significant bilaterally with facet joint cyst on the left.   He reports that he quit smoking about 30 years ago. His smoking use included pipe. He has never used smokeless tobacco.  Recent Labs    03/20/18 1349  HGBA1C 6.5*    Objective:  VS:  HT:     WT:    BMI:      BP:(!) 153/79   HR:81bpm   TEMP: ( )   RESP:  Physical Exam Vitals signs and nursing note reviewed.  Constitutional:      General: He is not in acute distress.    Appearance: Normal appearance. He is well-developed.  HENT:     Head: Normocephalic and atraumatic.  Eyes:     Conjunctiva/sclera: Conjunctivae normal.     Pupils: Pupils are equal, round, and reactive to light.  Neck:     Musculoskeletal: Normal range of motion and neck supple. No neck rigidity.  Cardiovascular:     Rate and Rhythm: Normal rate.     Pulses: Normal pulses.     Heart sounds: Normal heart sounds.  Pulmonary:     Effort: Pulmonary effort is normal. No respiratory distress.  Musculoskeletal:     Right lower leg: No edema.     Left lower leg: No edema.     Comments: Patient ambulates with a walker slow to rise from a seated position to full extension some pain with extension and facet loading no pain over the greater trochanters he does have some decreased or different sensation in an L5 somewhat S1 dermatome on the left.  He has good distal strength no clonus.  He has sensation loss also in a stocking distribution bilaterally at the feet.  Skin:    General: Skin is warm and dry.     Findings: No erythema or rash.  Neurological:     General: No focal deficit present.     Mental Status: He is alert and oriented to person, place, and time.     Sensory: No sensory deficit.     Coordination: Coordination normal.     Gait: Gait normal.  Psychiatric:        Mood and Affect: Mood normal.        Behavior: Behavior normal.       Ortho Exam Imaging: No results found.  Past Medical/Family/Surgical/Social History: Medications & Allergies reviewed per EMR, new medications updated. 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 ([place]) 03/27/2018   Chronic left-sided low back pain with left-sided sciatica 03/27/2018   History of complete ray amputation of first toe of right foot ([place]) 12/28/2017   Idiopathic chronic venous hypertension of both lower extremities with inflammation 12/13/2017   Thrush 11/29/2017   Cellulitis of right foot 11/18/2017   Cellulitis of right ankle 11/18/2017   Adrenal insufficiency ([place]) 07/01/2017   Bulging of intervertebral disc between L4 and L5 07/01/2017   At high risk for bleeding 07/01/2017   Right buttock pain 02/08/2017   Leukocytosis 10/12/2016   HLD (hyperlipidemia) 04/20/2016   BPH (benign prostatic hyperplasia) 12/08/2015   CKD (chronic kidney disease) stage 3, GFR 30-59 ml/min (HCC) 11/27/2015   S/P total knee arthroplasty 07/13/2015   OA (osteoarthritis) of knee 06/29/2015   Ectropion 05/06/2015   Lateral meniscal tear 02/05/2015   Hyponatremia 10/20/2013   Weakness 10/19/2013   Hypertension    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[place])    Hypothyroidism (acquired)    Anemia, B12 deficiency    Meibomian gland disease 08/06/2013   Primary open angle glaucoma 06/19/2013   Arthritis of shoulder region, right 12/15/2011   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[place]) 2000   Treated with hormone replacement   Hypothyroidism (acquired) 2000   Nocturia    Osteomyelitis ([place])    right great   Osteomyelitis ([place])    RIGHT FOOT /TOE   Pituitary mass ([place]) 2000   S/p transphenoidal excision 05/1999 ([facility])   Vitamin D deficiency 2009   Family History  Problem Relation Age of Onset   Lung cancer Father    Anesthesia problems Neg Hx    Hypotension Neg Hx    Malignant hyperthermia Neg Hx    Pseudochol deficiency Neg Hx    Past Surgical History:  Procedure Laterality Date   AMPUTATION Right 12/20/2017   Procedure: RIGHT 1ST RAY AMPUTATION;  Surgeon: [name], MD;  Location: [facility];  Service: Orthopedics;  Laterality: Right;   AMPUTATION Right 03/23/2018   Procedure: RIGHT 2ND TOE AMPUTATION;  Surgeon: [name], MD;  Location: [facility];  Service: Orthopedics;  Laterality: Right;   BRAIN SURGERY  2000   pituatary gland removed .   CHOLECYSTECTOMY  1984   Ectropion surgery Bilateral 2006   EYE SURGERY  over last 6 yrs.     trabeculectomy...    EYE SURGERY   cat ext ou   JOINT REPLACEMENT  2011   knee left   KNEE ARTHROSCOPY Right 02/06/2015   Procedure: RIGHT ARTHROSCOPY KNEE  WITH LATERAL MENSICAL  DEBRIDEMENT;  Surgeon: Gaynelle Arabian, MD;  Location: [facility];  Service: Orthopedics;  Laterality: Right;   Mohs procedure      for skin cancer on nose    NEPHRECTOMY Right 1994   Renal cell   REVERSE SHOULDER ARTHROPLASTY  12/15/2011   Procedure: REVERSE SHOULDER ARTHROPLASTY;  Surgeon: Marin Shutter, MD;  Location: [facility];  Service: Orthopedics;  Laterality: Right;  right total reverse shoulder   TONSILLECTOMY     TOTAL KNEE ARTHROPLASTY Right 06/29/2015   Procedure: TOTAL KNEE ARTHROPLASTY;  Surgeon: Gaynelle Arabian, MD;  Location: [facility];  Service: Orthopedics;  Laterality: Right;   Transsphenoidal excision pituitary tumor  05/1999   A.Tommi Rumps, M.D.([facility])   Social History   Occupational History   Not on file  Tobacco Use   Smoking status: Former Smoker    Types: Pipe    Quit date: 09/26/1988    Years since quitting: 30.4   Smokeless tobacco: Never Used   Tobacco comment: about 20 years  Substance and Sexual Activity   Alcohol use: Yes    Comment: 2-3 drinks per week   Drug use: No   Sexual activity: Not Currently

## 2019-03-05 ENCOUNTER — Telehealth: Payer: Self-pay | Admitting: Physical Medicine and Rehabilitation

## 2019-03-05 NOTE — Telephone Encounter (Signed)
He should be taking the duloxetine 20 mg capsules 3 at a time or once and if he is doing that and tolerating it and it will take about 2 weeks once he is on the 3 of them at a time which is 60 mg.  If it is helping at all at the end of 2 weeks or if he needs a refill until then we can refill with 60 mg capsules

## 2019-03-06 NOTE — Telephone Encounter (Signed)
Called patient to advise. He will let us know if he needs a refill.

## 2019-03-13 ENCOUNTER — Other Ambulatory Visit: Payer: Self-pay

## 2019-03-13 ENCOUNTER — Telehealth: Payer: Self-pay | Admitting: *Deleted

## 2019-03-13 ENCOUNTER — Non-Acute Institutional Stay: Payer: PPO | Admitting: Internal Medicine

## 2019-03-13 ENCOUNTER — Encounter: Payer: Self-pay | Admitting: Internal Medicine

## 2019-03-13 VITALS — BP 140/70 | HR 73 | Temp 98.2°F | Ht 72.0 in | Wt 172.0 lb

## 2019-03-13 DIAGNOSIS — R351 Nocturia: Secondary | ICD-10-CM

## 2019-03-13 DIAGNOSIS — R296 Repeated falls: Secondary | ICD-10-CM

## 2019-03-13 DIAGNOSIS — M869 Osteomyelitis, unspecified: Secondary | ICD-10-CM | POA: Diagnosis not present

## 2019-03-13 DIAGNOSIS — H5789 Other specified disorders of eye and adnexa: Secondary | ICD-10-CM | POA: Diagnosis not present

## 2019-03-13 DIAGNOSIS — M5442 Lumbago with sciatica, left side: Secondary | ICD-10-CM

## 2019-03-13 DIAGNOSIS — N401 Enlarged prostate with lower urinary tract symptoms: Secondary | ICD-10-CM | POA: Diagnosis not present

## 2019-03-13 DIAGNOSIS — R2681 Unsteadiness on feet: Secondary | ICD-10-CM | POA: Diagnosis not present

## 2019-03-13 DIAGNOSIS — G8929 Other chronic pain: Secondary | ICD-10-CM | POA: Diagnosis not present

## 2019-03-13 NOTE — Telephone Encounter (Signed)
Pt was confused on how to take Cymbalta, pt was taking 20mg  three times daily, when pt should be taking 60mg  once daily, after Dr. Mariea Clonts consulted with Dr. Ernestina Patches, pt was taking incorrectly. Spoke with patient and advised he should take 60mg  once daily. Per Dr. Mariea Clonts monitor dizziness and unsteadiness and see if it get better. Also monitor left leg pain and call if not better.

## 2019-03-13 NOTE — Progress Notes (Signed)
Location:  Occupational psychologist of Service:  Clinic (12)  Provider: Ravynn Hogate L. Mariea Clonts, D.O., C.M.D.  Code Status: DNR Goals of Care:  Advanced Directives 07/03/2018  Does Patient Have a Medical Advance Directive? Yes  Type of Paramedic of [place];Out of facility DNR (pink MOST or yellow form)  Does patient want to make changes to medical advance directive? No - Patient declined  Copy of [place] in Chart? Yes  Would patient like information on creating a medical advance directive? -  Pre-existing out of facility DNR order (yellow form or pink MOST form) Yellow form placed in chart (order not valid for inpatient use);Pink MOST form placed in chart (order not valid for inpatient use)     Chief Complaint  Patient presents with  . Medical Management of Chronic Issues    49mth follow-up    HPI: Patient is a 85 y.o. male seen today for medical management of chronic diseases.    He's taking 3 a day of cymbalta 20mg  (TID) for his left radicular pain.  Has been on titration for 6 weeks.  There's confusion about this.  He thinks it may be making him dizzy and more unsteady.  He had modest help from his epidural.  He fell twice without hurting himself--just had skin tears.   Reached down to pick up box of oatmeal he dropped.  Other time, he turned quickly when in the laundry room. He says he's been trying to do squats up to 20 a day and other exercises Thamas Jaegers from PT taught him.  He holds onto the sink to do the squats.    He is doing well otherwise, very happily married at last (three times is a charm by his report).    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   Past Medical History:  Diagnosis Date  . Allergic rhinitis    Prior allergy shots 20 years  . Anemia   . Anemia, B12 deficiency 2000  . Arthritis    Status post left total replacement 8 2011  . BPH  (benign prostatic hyperplasia) 2013  . Cancer ([place])    renal cell ca and skin cancer   . Colitis 2014  . Difficult intubation 1994   surgery had to be  stopped due to injury to "throat"  . Difficult intubation 1994   no trouble since.  Required nasotracheal intubation  '02 [facility] / ANESTHESIA RECORD FROM 2013 AND 2016 IN EPIC  . GERD (gastroesophageal reflux disease)   . Glaucoma 2007   Status post left trabeculectomy 2007  . History of colon polyps    Colonoscopy 2001  . History of kidney stones   . History of renal cell carcinoma 1994   Status post right nephrectomy  . Hyperlipidemia 2003  . Hypertension   . 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 Hypothyroidism (acquired) 2000  . Nocturia   . Osteomyelitis ([place])    right great  . Osteomyelitis ([place])    RIGHT FOOT /TOE  . Pituitary mass ([place]) 2000   S/p transphenoidal excision 05/1999 ([facility])  . Vitamin D deficiency 2009    Past Surgical History:  Procedure Laterality Date  . AMPUTATION Right 12/20/2017   Procedure: RIGHT 1ST RAY AMPUTATION;  Surgeon: [name], MD;  Location: [place];  Service: Orthopedics;  Laterality: Right;  . AMPUTATION  Right 03/23/2018   Procedure: RIGHT 2ND TOE AMPUTATION;  Surgeon: [name], MD;  Location: [place];  Service: Orthopedics;  Laterality: Right;  . BRAIN SURGERY  2000   pituatary gland removed .  Marland Kitchen CHOLECYSTECTOMY  1984  . Ectropion surgery Bilateral 2006  . EYE SURGERY  over last 6 yrs.     trabeculectomy...   . EYE SURGERY   cat ext ou  . JOINT REPLACEMENT  2011   knee left  . KNEE ARTHROSCOPY Right 02/06/2015   Procedure: RIGHT ARTHROSCOPY KNEE WITH LATERAL MENSICAL  DEBRIDEMENT;  Surgeon: Gaynelle Arabian, MD;  Location: [facility];  Service: Orthopedics;  Laterality: Right;  . Mohs procedure      for skin cancer on nose   . NEPHRECTOMY Right 1994   Renal cell  . REVERSE SHOULDER ARTHROPLASTY  12/15/2011   Procedure: REVERSE  SHOULDER ARTHROPLASTY;  Surgeon: Marin Shutter, MD;  Location: [place];  Service: Orthopedics;  Laterality: Right;  right total reverse shoulder  . TONSILLECTOMY    . TOTAL KNEE ARTHROPLASTY Right 06/29/2015   Procedure: TOTAL KNEE ARTHROPLASTY;  Surgeon: Gaynelle Arabian, MD;  Location: [facility];  Service: Orthopedics;  Laterality: Right;  . Transsphenoidal excision pituitary tumor  05/1999   A.Tommi Rumps, M.D.([facility])    Allergies  Allergen Reactions  . Adhesive [Tape] Other (See Comments)    CAUSES SKIN TEARS, prefers paper tape   . Lyrica [Pregabalin] Swelling  . Percocet [Oxycodone-Acetaminophen] Other (See Comments)    UNSPECIFIED REACTION  Does not want to take  . Robaxin [Methocarbamol]     UNSPECIFIED REACTION   . Diovan [Valsartan] Rash    Outpatient Encounter Medications as of 03/13/2019  Medication Sig  . acetaminophen (TYLENOL) 500 MG tablet Take 500 mg by mouth daily as needed for fever.   . Cholecalciferol (VITAMIN D) 2000 units tablet Take 2,000 Units by mouth daily.  . Coenzyme Q10 300 MG CAPS Take 300 mg by mouth daily.   Marland Kitchen docusate sodium (COLACE) 100 MG capsule Take 200 mg by mouth at bedtime.  . DULoxetine (CYMBALTA) 20 MG capsule Take 60 mg by mouth daily.  . finasteride (PROSCAR) 5 MG tablet Take 5 mg by mouth daily.   . Flaxseed, Linseed, (EQL FLAXSEED OIL) 1200 MG CAPS Take as directed once a day  . Multiple Vitamins-Minerals (MULTIVITAMIN & MINERAL PO) Take 1 tablet by mouth daily.  . Omega-3 Fatty Acids (OMEGA-3 FISH OIL PO) Take 700 mg by mouth daily.  . pravastatin (PRAVACHOL) 80 MG tablet Take 80 mg by mouth every other day.   . predniSONE (DELTASONE) 5 MG tablet Take 2.5-5 mg by mouth See admin instructions. 5mg  in the morning and 2.5mg  in the evening  . SYNTHROID 88 MCG tablet Take 88 mcg by mouth daily before breakfast.   . testosterone cypionate (DEPOTESTOSTERONE CYPIONATE) 200 MG/ML injection Inject 30 mg (0.15 ml) intramuscularly weekly. Must discard vial  after 90 days.  Marland Kitchen timolol (TIMOPTIC) 0.5 % ophthalmic solution Place 1 drop into the right eye daily.  . vitamin B-12 (CYANOCOBALAMIN) 500 MCG tablet Take 500 mcg by mouth daily.  . [DISCONTINUED] DULoxetine (CYMBALTA) 20 MG capsule Take 1 capsule (20 mg total) by mouth daily. Then after 7 days increase to 2 caps daily for 7 days then 3 caps daily.   Facility-Administered Encounter Medications as of 03/13/2019  Medication  . mupirocin cream (BACTROBAN) 2 %    Review of Systems:  Review of Systems  Constitutional: Negative for chills, fever  and malaise/fatigue.  HENT: Positive for hearing loss. Negative for congestion.   Eyes: Positive for redness. Negative for blurred vision.       Left eye red, he doesn't think it's closing fully at night, does have gel to use, but sounds like he's not been faithful with it  Respiratory: Negative for cough and shortness of breath.   Cardiovascular: Negative for chest pain, palpitations and leg swelling.  Gastrointestinal: Negative for abdominal pain.  Genitourinary: Positive for urgency. Negative for dysuria.       Incontinence  Musculoskeletal: Positive for back pain and falls.  Skin: Negative for itching and rash.  Neurological: Positive for dizziness. Negative for loss of consciousness.  Endo/Heme/Allergies: Bruises/bleeds easily.  Psychiatric/Behavioral: Positive for memory loss. Negative for depression. The patient is not nervous/anxious and does not have insomnia.        A small amount of cognitive impairment notable that worsens when he's ill    Health Maintenance  Topic Date Due  . FOOT EXAM  01/22/1933  . OPHTHALMOLOGY EXAM  01/22/1933  . URINE MICROALBUMIN  01/22/1933  . HEMOGLOBIN A1C  09/20/2018  . INFLUENZA VACCINE  03/30/2019  . TETANUS/TDAP  06/08/2028  . PNA vac Low Risk Adult  Completed    Physical Exam: Vitals:   03/13/19 1131  BP: 140/70  Pulse: 73  Temp: 98.2 F (36.8 C)  TempSrc: Oral  SpO2: 95%  Weight: 172 lb  (78 kg)  Height: 6' (1.829 m)   Body mass index is 23.33 kg/m. Physical Exam Vitals signs reviewed.  Constitutional:      General: He is not in acute distress.    Appearance: Normal appearance. He is normal weight. He is not toxic-appearing.  HENT:     Head: Normocephalic and atraumatic.  Cardiovascular:     Rate and Rhythm: Normal rate and regular rhythm.     Pulses: Normal pulses.     Heart sounds: Normal heart sounds.  Pulmonary:     Effort: Pulmonary effort is normal.     Breath sounds: Normal breath sounds.  Abdominal:     General: Bowel sounds are normal.     Palpations: Abdomen is soft.  Musculoskeletal: Normal range of motion.  Skin:    General: Skin is warm and dry.     Comments: Thin skin (chronic steroids)  Neurological:     General: No focal deficit present.     Mental Status: He is alert and oriented to person, place, and time.     Gait: Gait abnormal.     Comments: Stayed in scooter due to fear of falling; has been more unsteady  Psychiatric:        Mood and Affect: Mood normal.     Labs reviewed: Basic Metabolic Panel: Recent Labs    03/20/18 1343 03/20/18 1602  03/22/18 0339 03/23/18 0400 03/24/18 0709 10/15/18  NA 135  --    < > 137 139 136 140  K 4.1  --    < > 4.2 3.9 3.7 4.0  CL 100  --    < > 101 104 99  --   CO2 26  --    < > 28 27 29   --   GLUCOSE 85  --    < > 111* 108* 92  --   BUN 16  --    < > 13 17 15  24*  CREATININE 1.28*  --    < > 1.55* 1.46* 1.54* 1.2  CALCIUM 9.4  --    < >  9.2 9.0 8.8*  --   MG 2.0  --   --   --   --   --   --   TSH  --  <0.010*  --   --   --   --   --    < > = values in this interval not displayed.   Liver Function Tests: Recent Labs    03/20/18 1343 03/21/18 0512 10/15/18  AST 17 15 13*  ALT 15 14 12   ALKPHOS 49 43 48  BILITOT 0.8 1.0  --   PROT 6.5 5.3*  --   ALBUMIN 3.2* 2.4*  --    No results for input(s): LIPASE, AMYLASE in the last 8760 hours. No results for input(s): AMMONIA in the last  8760 hours. CBC: Recent Labs    03/22/18 0339 03/23/18 0400 03/24/18 0709 10/15/18  WBC 12.3* 11.6* 15.0* 9.0  HGB 12.1* 11.5* 11.7* 12.7*  HCT 37.0* 35.9* 36.2* 38*  MCV 98.7 98.6 98.6  --   PLT 267 286 277 243   Lipid Panel: No results for input(s): CHOL, HDL, LDLCALC, TRIG, CHOLHDL, LDLDIRECT in the last 8760 hours. Lab Results  Component Value Date   HGBA1C 6.5 (H) 03/20/2018    Assessment/Plan 1. Unsteady gait -suggested getting more therapy, but he refused  2. Frequent falls -will see if this gets better with proper use of his cymbalta rather than tid   3. Benign prostatic hyperplasia with nocturia -having more incontinence -may need to follow-up with urology -discussed that myrbetriq which is the best option for his overactive bladder symptoms is contraindicated with glaucoma  4. Chronic left-sided low back pain with left-sided sciatica -felt to be radiculopathy from lumbar region -followed by Dr. Marius Ditch is to be taking all 60mg  of cymbalta at once as it's classically prescribed--we called and advised him of this and he agrees to try it -if he continues to be dizzier as he's been, he should call back so we can get him back off of it -epidurals not working that well anymore either -has already tried gabapentin and pregabalin also  5. Osteomyelitis of great toe of right foot ([place]) -s/p amputation, resolved  6. Redness of left eye -f/u with Dr. Ellie Lunch or Dr. Andria Frames   Labs/tests ordered:  No new, had panel with Dr. Elyse Hsu Next appt:  07/17/2019  Fowler Antos L. Kamri Gotsch, D.O. [facility] [address], [place] [address] Cell Phone (Mon-Fri 8am-5pm):  [phone] On Call:  [phone] & follow prompts after 5pm & weekends Office Phone:  [phone] Office Fax:  [phone]

## 2019-03-13 NOTE — Patient Instructions (Signed)
I will message Dr. Ernestina Patches about your cymbalta.    Contact Dr. Junious Silk to be seen about your worsening urinary incontinence.    Please contact Dr. Prudencio Burly to get your eye exam due to your left eye redness.  Be very careful not to fall.  Continue your exercises and drinking water especially during the day.  Avoid drinking water for at least 2 hours before bed.

## 2019-03-14 ENCOUNTER — Other Ambulatory Visit: Payer: Self-pay | Admitting: Physical Medicine and Rehabilitation

## 2019-03-14 ENCOUNTER — Telehealth: Payer: Self-pay | Admitting: *Deleted

## 2019-03-14 DIAGNOSIS — M7138 Other bursal cyst, other site: Secondary | ICD-10-CM

## 2019-03-14 DIAGNOSIS — M25552 Pain in left hip: Secondary | ICD-10-CM

## 2019-03-14 DIAGNOSIS — M5416 Radiculopathy, lumbar region: Secondary | ICD-10-CM

## 2019-03-14 MED ORDER — DULOXETINE HCL 60 MG PO CPEP: 60.0000 mg | ORAL_CAPSULE | Freq: Every day | ORAL | 1 refills | Status: DC

## 2019-03-14 NOTE — Telephone Encounter (Signed)
Done

## 2019-04-09 ENCOUNTER — Telehealth: Payer: Self-pay | Admitting: Physical Medicine and Rehabilitation

## 2019-04-09 NOTE — Telephone Encounter (Signed)
Have him come in for L5 TF that we may do pulsed RFA

## 2019-04-10 NOTE — Telephone Encounter (Signed)
Pt is scheduled with driver 1/99/4129

## 2019-04-16 ENCOUNTER — Telehealth: Payer: Self-pay | Admitting: Internal Medicine

## 2019-04-16 NOTE — Telephone Encounter (Signed)
New Message      Pt is calling to speak about a personal situation with Dr Rayann Heman    Please call

## 2019-04-18 ENCOUNTER — Telehealth: Payer: Self-pay | Admitting: Physical Medicine and Rehabilitation

## 2019-04-25 ENCOUNTER — Ambulatory Visit (INDEPENDENT_AMBULATORY_CARE_PROVIDER_SITE_OTHER): Payer: PPO | Admitting: Physical Medicine and Rehabilitation

## 2019-04-25 ENCOUNTER — Encounter: Payer: Self-pay | Admitting: Physical Medicine and Rehabilitation

## 2019-04-25 ENCOUNTER — Ambulatory Visit: Payer: Self-pay

## 2019-04-25 VITALS — BP 160/79 | HR 85

## 2019-04-25 DIAGNOSIS — M79605 Pain in left leg: Secondary | ICD-10-CM

## 2019-04-25 DIAGNOSIS — M5416 Radiculopathy, lumbar region: Secondary | ICD-10-CM | POA: Diagnosis not present

## 2019-04-25 DIAGNOSIS — G894 Chronic pain syndrome: Secondary | ICD-10-CM

## 2019-04-25 DIAGNOSIS — M48062 Spinal stenosis, lumbar region with neurogenic claudication: Secondary | ICD-10-CM

## 2019-04-25 DIAGNOSIS — R202 Paresthesia of skin: Secondary | ICD-10-CM

## 2019-04-25 DIAGNOSIS — M48061 Spinal stenosis, lumbar region without neurogenic claudication: Secondary | ICD-10-CM

## 2019-04-25 MED ORDER — METHYLPREDNISOLONE ACETATE 80 MG/ML IJ SUSP
80.0000 mg | Freq: Once | INTRAMUSCULAR | Status: AC
  Administered 2019-04-25: 14:00:00 80 mg

## 2019-04-25 MED ORDER — DICLOFENAC SODIUM 1.5 % TD SOLN: TRANSDERMAL | 3 refills | Status: AC

## 2019-04-25 NOTE — Progress Notes (Signed)
 .  Numeric Pain Rating Scale and Functional Assessment Average Pain 9   In the last MONTH (on 0-10 scale) has pain interfered with the following?  1. General activity like being  able to carry out your everyday physical activities such as walking, climbing stairs, carrying groceries, or moving a chair?  Rating(9)   +Driver, -BT, -Dye Allergies.  

## 2019-04-30 ENCOUNTER — Encounter: Payer: Self-pay | Admitting: Physical Medicine and Rehabilitation

## 2019-04-30 NOTE — Procedures (Signed)
Lumbosacral Transforaminal Epidural Steroid Injection - Sub-Pedicular Approach with Fluoroscopic Guidance  Patient: [name] [name].      Date of Birth: 01-23-1923 MRN: [id number] PCP: [name], DO      Visit Date: 04/25/2019   Universal Protocol:    Date/Time: 04/25/2019  Consent Given By: the patient  Position: PRONE  Additional Comments: Vital signs were monitored before and after the procedure. Patient was prepped and draped in the usual sterile fashion. The correct patient, procedure, and site was verified.   Injection Procedure Details:  Procedure Site One Meds Administered:  Meds ordered this encounter  Medications  . methylPREDNISolone acetate (DEPO-MEDROL) injection 80 mg  . Diclofenac Sodium 1.5 % SOLN    Sig: Dispense 10 to 40 drops to affected area on skin as directed, twice per day    Dispense:  150 mL    Refill:  3    Laterality: Left  Location/Site:  L5-S1  Needle size: 22 G  Needle type: Spinal  Needle Placement: Transforaminal  Findings:    -Comments: Excellent flow of contrast along the nerve and into the epidural space.  Procedure Details: After squaring off the end-plates to get a true AP view, the C-arm was positioned so that an oblique view of the foramen as noted above was visualized. The target area is just inferior to the "nose of the scotty dog" or sub pedicular. The soft tissues overlying this structure were infiltrated with 2-3 ml. of 1% Lidocaine without Epinephrine.  The spinal needle was inserted toward the target using a "trajectory" view along the fluoroscope beam.  Under AP and lateral visualization, the needle was advanced so it did not puncture dura and was located close the 6 O'Clock position of the pedical in AP tracterory. Biplanar projections were used to confirm position. Aspiration was confirmed to be negative for CSF and/or blood. A 1-2 ml. volume of Isovue-250 was injected and flow of contrast was noted at each  level. Radiographs were obtained for documentation purposes.   After attaining the desired flow of contrast documented above, a 0.5 to 1.0 ml test dose of 0.25% Marcaine was injected into each respective transforaminal space.  The patient was observed for 90 seconds post injection.  After no sensory deficits were reported, and normal lower extremity motor function was noted,   the above injectate was administered so that equal amounts of the injectate were placed at each foramen (level) into the transforaminal epidural space.   Additional Comments:  The patient tolerated the procedure well Dressing: 2 x 2 sterile gauze and Band-Aid    Post-procedure details: Patient was observed during the procedure. Post-procedure instructions were reviewed.  Patient left the clinic in stable condition.

## 2019-04-30 NOTE — Progress Notes (Signed)
[name]  Date of birth: 02/24/1923  Office Visit Note: Visit Date: 04/25/2019 PCP: [name] Curry, DO Referred by: [name] Curry, DO  Subjective: Chief Complaint  Patient presents with   Lower Back - Pain   Left Thigh - Pain   HPI: [name] [name]. is a 85 y.o. male who comes in today Reevaluation left radicular type leg pain with paresthesia in the left lower lateral leg.  By way of brief review patient originally was seen by [name] [name] and at his age was not felt to be a surgical candidate MRI at that time showed moderate central canal narrowing as well as lateral recess narrowing.  Epidural injections were performed from a transforaminal approach at L4 as well as interlaminar approach at L4-5 the patient did not get much relief from that.  He actually came to see me as a second opinion and at that point we decided after looking at the MRI that there was small facet joint cyst which was likely coupled with foraminal narrowing at L5 and if symptoms are more classic L5 symptoms.  We decided to complete diagnostic and therapeutic L5 transforaminal injection he actually got several months of relief out of that.  Unfortunately we have not been able to get back to that point at this point.  We have tried facet joint block we have tried interlaminar injection and S1 injection none of which have helped him much at all.  We went away from interventional treatments and went more towards medication management and the patient got a little better relief from Lyrica but then started to have some swelling so this was discontinued.  He has not done well or tolerated gabapentin in the past.  We did ultimately try duloxetine but there was some miscommunication with how he should take that if he was taking 20 mg 3 times a day at different times and this was causing him a great bit of dizziness and so he did stop this.  I did have a conversation with his primary  doctor Dr. Mariea [name] about medications for nerve type pain in the setting of a 85 year old gentleman with history of pituitary resection not being a very good candidate for other medications.  It has been sort of a big issue.  He is tried Robaxin in the past as well as oxycodone and Lyrica really with out any good tolerance to any of those.  Even without the tolerance he did not seem to help much.  He has not had electrodiagnostic study of the left lower limb.  He comes in today with worsening symptoms still in that left lower leg worse with standing and walking.  He does ambulate with a walker.  He rates the pain as 9 out of 10.  Sitting seems to relieve it quite a bit and is really interfering with his daily activities.  Review of Systems  Constitutional: Negative for chills, fever, malaise/fatigue and weight loss.  HENT: Negative for hearing loss and sinus pain.   Eyes: Negative for blurred vision, double vision and photophobia.  Respiratory: Negative for cough and shortness of breath.   Cardiovascular: Negative for chest pain, palpitations and leg swelling.  Gastrointestinal: Negative for abdominal pain, nausea and vomiting.  Genitourinary: Negative for flank pain.  Musculoskeletal: Positive for back pain. Negative for myalgias.       Left radicular leg pain  Skin: Negative for itching and rash.  Neurological: Positive for  tingling. Negative for tremors, focal weakness and weakness.  Endo/Heme/Allergies: Negative.   Psychiatric/Behavioral: Negative for depression.  All other systems reviewed and are negative.  Otherwise per HPI.  Assessment & Plan: Visit Diagnoses:  1. Lumbar radiculopathy   2. Spinal stenosis of lumbar region with neurogenic claudication   3. Foraminal stenosis of lumbar region   4. Paresthesia of skin   5. Pain in left leg   6. Chronic pain syndrome     Plan: Findings:  Chronic worsening severe left radicular leg pain worse in the left lower back and buttock and and  somewhat in the thigh but most predominantly across the lateral knee and to the anterior lateral shin and ankle in a pretty classic L5 distribution.  This could be fibular nerve irritation although that is doubtful.  He does have some level of neuropathy but this is more one-sided.  I think at this point since we have not done it in a while we can repeat the L5 transforaminal injection and try to get the best injection we can get.  I also want to use Depo-Medrol at least on one occasion which we used to do transforaminal injections with more particulate steroid in the past without really major difficulties but there is been some change in that from a safety standpoint.  I think particularly of L5 at least a number of times we have done the injection with him with other medications would be safe to do.  If the contrast is any pain other than perfect and I would switch the medication to Celestone or dexamethasone.  Last injection was an S1 injection gave him no relief whatsoever.  He does have a lot of foraminal narrowing so we will try to get a good injection today.  Depending on that the only other interventional spine procedure that I can think of would be pulsed radiofrequency of the L5 dorsal root which is not done very often once a safe procedure and it would be something we can discuss with him.  There is really not a lot of evidence either way of helping to a great deal but it may be worth a try.  Other avenues is topical medications which she has used.  He did well with Pennsaid2 but is having a problem getting that prescribed.  [facility] and they state that Pennsaid 1.5% can be obtained generically.  It comes in droplets instead of a gel.  I did write a prescription for that for him today.  He can also try at some point compounded medication that we can look at for topical nerve pain although I am not sure this is been helping since this is probably root related in the  spine.  Would consider sending him for electrodiagnostic study of the left lower gland.    Meds & Orders:  Meds ordered this encounter  Medications   methylPREDNISolone acetate (DEPO-MEDROL) injection 80 mg   Diclofenac Sodium 1.5 % SOLN    Sig: Dispense 10 to 40 drops to affected area on skin as directed, twice per day    Dispense:  150 mL    Refill:  3    Orders Placed This Encounter  Procedures   XR C-ARM NO REPORT   Epidural Steroid injection    Follow-up: No follow-ups on file.   Procedures: No procedures performed  Lumbosacral Transforaminal Epidural Steroid Injection - Sub-Pedicular Approach with Fluoroscopic Guidance  Patient: [name] [name].  Date of Birth: Aug 10, 1923 MRN: [id number] PCP: [name] Curry, DO      Visit Date: 04/25/2019   Universal Protocol:    Date/Time: 04/25/2019  Consent Given By: the patient  Position: PRONE  Additional Comments: Vital signs were monitored before and after the procedure. Patient was prepped and draped in the usual sterile fashion. The correct patient, procedure, and site was verified.   Injection Procedure Details:  Procedure Site One Meds Administered:  Meds ordered this encounter  Medications   methylPREDNISolone acetate (DEPO-MEDROL) injection 80 mg   Diclofenac Sodium 1.5 % SOLN    Sig: Dispense 10 to 40 drops to affected area on skin as directed, twice per day    Dispense:  150 mL    Refill:  3    Laterality: Left  Location/Site:  L5-S1  Needle size: 22 G  Needle type: Spinal  Needle Placement: Transforaminal  Findings:    -Comments: Excellent flow of contrast along the nerve and into the epidural space.  Procedure Details: After squaring off the end-plates to get a true AP view, the C-arm was positioned so that an oblique view of the foramen as noted above was visualized. The target area is just inferior to the "nose of the scotty dog" or sub pedicular. The soft tissues overlying  this structure were infiltrated with 2-3 ml. of 1% Lidocaine without Epinephrine.  The spinal needle was inserted toward the target using a "trajectory" view along the fluoroscope beam.  Under AP and lateral visualization, the needle was advanced so it did not puncture dura and was located close the 6 O'Clock position of the pedical in AP tracterory. Biplanar projections were used to confirm position. Aspiration was confirmed to be negative for CSF and/or blood. A 1-2 ml. volume of Isovue-250 was injected and flow of contrast was noted at each level. Radiographs were obtained for documentation purposes.   After attaining the desired flow of contrast documented above, a 0.5 to 1.0 ml test dose of 0.25% Marcaine was injected into each respective transforaminal space.  The patient was observed for 90 seconds post injection.  After no sensory deficits were reported, and normal lower extremity motor function was noted,   the above injectate was administered so that equal amounts of the injectate were placed at each foramen (level) into the transforaminal epidural space.   Additional Comments:  The patient tolerated the procedure well Dressing: 2 x 2 sterile gauze and Band-Aid    Post-procedure details: Patient was observed during the procedure. Post-procedure instructions were reviewed.  Patient left the clinic in stable condition.     Clinical History: Lumbar spine MRI dated 2018 is completed at [facility].  At L4-5 there is moderate multifactorial stenosis with foraminal narrowing and disc protrusion on the left.  At L5-S1 there is facet arthropathy which is pretty significant bilaterally with facet joint cyst on the left.   He reports that he quit smoking about 30 years ago. His smoking use included pipe. He has never used smokeless tobacco. No results for input(s): HGBA1C, LABURIC in the last 8760 hours.  Objective:  VS:  HT:     WT:    BMI:      BP:(!) 160/79   HR:85bpm   TEMP: ( )    RESP:  Physical Exam Vitals signs and nursing note reviewed.  Constitutional:      General: He is not in acute distress.    Appearance: He is well-developed.  HENT:     Head:  Normocephalic and atraumatic.     Nose: Nose normal.     Mouth/Throat:     Mouth: Mucous membranes are moist.     Pharynx: Oropharynx is clear.  Eyes:     Conjunctiva/sclera: Conjunctivae normal.     Pupils: Pupils are equal, round, and reactive to light.  Neck:     Musculoskeletal: Normal range of motion and neck supple.     Trachea: No tracheal deviation.  Cardiovascular:     Rate and Rhythm: Normal rate and regular rhythm.     Pulses: Normal pulses.  Pulmonary:     Effort: Pulmonary effort is normal.     Breath sounds: Normal breath sounds.  Abdominal:     General: There is no distension.     Palpations: Abdomen is soft.     Tenderness: There is no guarding or rebound.  Musculoskeletal:        General: No deformity.     Right lower leg: No edema.     Left lower leg: No edema.     Comments: Patient ambulates slowly with rolling walker.  He is slow to rise from a seated position to full extension.  He does have some pain with low back pain with facet loading and extension.  He has an equivocally positive slump test to the left with tight hamstring more than right.  He has good distal strength.  He has some impaired sensation in the L5 dermatome.  Skin:    General: Skin is warm and dry.     Findings: No erythema or rash.  Neurological:     General: No focal deficit present.     Mental Status: He is alert and oriented to person, place, and time.     Sensory: Sensory deficit present.     Motor: No weakness or abnormal muscle tone.     Coordination: Coordination normal.     Gait: Gait abnormal.  Psychiatric:        Mood and Affect: Mood normal.        Behavior: Behavior normal.        Thought Content: Thought content normal.     Ortho Exam Imaging: No results found.  Past  Medical/Family/Surgical/Social History: Medications & Allergies reviewed per EMR, new medications updated. 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 ([place]) 03/27/2018   Chronic left-sided low back pain with left-sided sciatica 03/27/2018   History of complete ray amputation of first toe of right foot ([place]) 12/28/2017   Idiopathic chronic venous hypertension of both lower extremities with inflammation 12/13/2017   Thrush 11/29/2017   Cellulitis of right foot 11/18/2017   Cellulitis of right ankle 11/18/2017   Adrenal insufficiency ([place]) 07/01/2017   Bulging of intervertebral disc between L4 and L5 07/01/2017   At high risk for bleeding 07/01/2017   Right buttock pain 02/08/2017   Leukocytosis 10/12/2016   HLD (hyperlipidemia) 04/20/2016   BPH (benign prostatic hyperplasia) 12/08/2015   CKD (chronic kidney disease) stage 3, GFR 30-59 ml/min ([place]) 11/27/2015   S/P total knee arthroplasty 07/13/2015   OA (osteoarthritis) of knee 06/29/2015   Ectropion 05/06/2015   Lateral meniscal tear 02/05/2015   Hyponatremia 10/20/2013  Weakness 10/19/2013   Hypertension    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[facility])    Hypothyroidism (acquired)    Anemia, B12 deficiency    Meibomian gland disease 08/06/2013   Primary open angle glaucoma 06/19/2013   Arthritis of shoulder region, right 12/15/2011   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[place]) 2000   Treated with hormone replacement   Hypothyroidism (acquired) 2000   Nocturia    Osteomyelitis ([place])    right great   Osteomyelitis ([place])    RIGHT FOOT /TOE   Pituitary mass ([place]) 2000   S/p transphenoidal excision 05/1999 ([facility])   Vitamin D deficiency 2009   Family History  Problem Relation Age of Onset   Lung cancer Father    Anesthesia problems Neg Hx    Hypotension Neg Hx    Malignant hyperthermia Neg Hx    Pseudochol deficiency Neg Hx    Past Surgical History:  Procedure Laterality Date   AMPUTATION Right 12/20/2017   Procedure: RIGHT 1ST RAY AMPUTATION;  Surgeon: [name], MD;  Location: [place];  Service: Orthopedics;  Laterality: Right;   AMPUTATION Right 03/23/2018   Procedure: RIGHT 2ND TOE AMPUTATION;  Surgeon: [name], MD;  Location: [place];  Service: Orthopedics;  Laterality: Right;   BRAIN SURGERY  2000   pituatary gland removed .   CHOLECYSTECTOMY  1984   Ectropion surgery Bilateral 2006   EYE SURGERY  over last 6 yrs.     trabeculectomy...    EYE SURGERY   cat ext ou   JOINT REPLACEMENT  2011   knee left   KNEE ARTHROSCOPY Right 02/06/2015   Procedure: RIGHT ARTHROSCOPY KNEE WITH LATERAL MENSICAL  DEBRIDEMENT;  Surgeon: Gaynelle Arabian, MD;  Location: [facility];  Service: Orthopedics;  Laterality: Right;   Mohs procedure      for skin cancer on nose    NEPHRECTOMY Right 1994   Renal cell   REVERSE SHOULDER ARTHROPLASTY  12/15/2011   Procedure: REVERSE SHOULDER ARTHROPLASTY;  Surgeon: Marin Shutter, MD;  Location: [place];  Service: Orthopedics;  Laterality: Right;  right total reverse shoulder   TONSILLECTOMY     TOTAL KNEE ARTHROPLASTY Right 06/29/2015   Procedure: TOTAL KNEE ARTHROPLASTY;  Surgeon: Gaynelle Arabian, MD;  Location: [facility];  Service: Orthopedics;  Laterality: Right;   Transsphenoidal excision pituitary tumor  05/1999   A.Tommi Rumps, M.D.([facility])   Social History   Occupational History  Not on file  Tobacco Use   Smoking status: Former Smoker    Types: Pipe    Quit date: 09/26/1988    Years since quitting: 30.6   Smokeless tobacco: Never Used   Tobacco comment: about 20 years  Substance and Sexual Activity   Alcohol use: Yes    Comment: 2-3 drinks per week   Drug use: No   Sexual activity: Not Currently

## 2019-05-01 DIAGNOSIS — N401 Enlarged prostate with lower urinary tract symptoms: Secondary | ICD-10-CM | POA: Diagnosis not present

## 2019-05-01 DIAGNOSIS — E291 Testicular hypofunction: Secondary | ICD-10-CM | POA: Diagnosis not present

## 2019-05-01 DIAGNOSIS — R3915 Urgency of urination: Secondary | ICD-10-CM | POA: Diagnosis not present

## 2019-05-07 DIAGNOSIS — M79671 Pain in right foot: Secondary | ICD-10-CM | POA: Diagnosis not present

## 2019-05-07 DIAGNOSIS — B351 Tinea unguium: Secondary | ICD-10-CM | POA: Diagnosis not present

## 2019-05-07 DIAGNOSIS — L84 Corns and callosities: Secondary | ICD-10-CM | POA: Diagnosis not present

## 2019-05-07 DIAGNOSIS — M79672 Pain in left foot: Secondary | ICD-10-CM | POA: Diagnosis not present

## 2019-05-13 ENCOUNTER — Telehealth: Payer: Self-pay | Admitting: *Deleted

## 2019-05-13 DIAGNOSIS — D510 Vitamin B12 deficiency anemia due to intrinsic factor deficiency: Secondary | ICD-10-CM | POA: Diagnosis not present

## 2019-05-13 DIAGNOSIS — E230 Hypopituitarism: Secondary | ICD-10-CM | POA: Diagnosis not present

## 2019-05-13 DIAGNOSIS — I10 Essential (primary) hypertension: Secondary | ICD-10-CM | POA: Diagnosis not present

## 2019-05-13 DIAGNOSIS — E559 Vitamin D deficiency, unspecified: Secondary | ICD-10-CM | POA: Diagnosis not present

## 2019-05-13 DIAGNOSIS — E7800 Pure hypercholesterolemia, unspecified: Secondary | ICD-10-CM | POA: Diagnosis not present

## 2019-05-13 DIAGNOSIS — M7989 Other specified soft tissue disorders: Secondary | ICD-10-CM | POA: Diagnosis not present

## 2019-05-13 NOTE — Telephone Encounter (Signed)
Patient called and stated that Katharine Look, nurse at [facility] spoke with on call this weekend and Lasix was prescribed for patient due to legs swelling. Patient stated that it helped and he needs a rx sent to his pharmacy. Stated that he is taking Lasix 20mg  twice daily. Medication is not in current medication list. Please Advise.   Uses [facility].

## 2019-05-14 ENCOUNTER — Encounter: Payer: Self-pay | Admitting: Orthopedic Surgery

## 2019-05-14 ENCOUNTER — Other Ambulatory Visit: Payer: Self-pay

## 2019-05-14 ENCOUNTER — Telehealth: Payer: Self-pay | Admitting: Orthopedic Surgery

## 2019-05-14 ENCOUNTER — Ambulatory Visit (INDEPENDENT_AMBULATORY_CARE_PROVIDER_SITE_OTHER): Payer: PPO | Admitting: Orthopedic Surgery

## 2019-05-14 VITALS — Ht 72.0 in | Wt 172.0 lb

## 2019-05-14 DIAGNOSIS — I87323 Chronic venous hypertension (idiopathic) with inflammation of bilateral lower extremity: Secondary | ICD-10-CM

## 2019-05-14 NOTE — Telephone Encounter (Signed)
Pt called to see if wife could accompanied patient to appt.per orders [name] patient is sharp and staff will help get patient in/out of car. They will also call family after appt. Patient understood

## 2019-05-14 NOTE — Telephone Encounter (Signed)
When Dr. Elyse Hsu prescribed lasix 20mg  once a day for him, it was intended to be for short-term use.  I spoke with clinic nurse and there was also an order from on call for the lasix.  He has also been to Dr. Sharol Given about the leg swelling today and has another ulceration on a toe of concern.  Considering I have not at all been involved in this, I'm not so sure he should be taking twice a day lasix with his history of getting easily dehydrated and orthostatic.  I do not want to prescribe additional diuretic for him ongoing until I have all of the information.

## 2019-05-15 NOTE — Telephone Encounter (Signed)
Tried calling patient to discuss provider's response. No answer. LMOM for patient to call office.

## 2019-05-23 ENCOUNTER — Encounter: Payer: Self-pay | Admitting: Physical Medicine and Rehabilitation

## 2019-05-23 ENCOUNTER — Ambulatory Visit (INDEPENDENT_AMBULATORY_CARE_PROVIDER_SITE_OTHER): Payer: PPO | Admitting: Physical Medicine and Rehabilitation

## 2019-05-23 VITALS — BP 175/77 | HR 51

## 2019-05-23 DIAGNOSIS — M5416 Radiculopathy, lumbar region: Secondary | ICD-10-CM | POA: Diagnosis not present

## 2019-05-23 DIAGNOSIS — M48061 Spinal stenosis, lumbar region without neurogenic claudication: Secondary | ICD-10-CM

## 2019-05-23 NOTE — Progress Notes (Signed)
Pt states pain in left thigh. Pt states last injection 04/25/2019 helped out with left leg pain and it feels a lot better. Pt states pain has decreased since last injection. Pt does not do anything with his minor pain.   .Numeric Pain Rating Scale and Functional Assessment Average Pain 2.5   In the last MONTH (on 0-10 scale) has pain interfered with the following?  1. General activity like being  able to carry out your everyday physical activities such as walking, climbing stairs, carrying groceries, or moving a chair?  Rating(2)   +Driver, -BT, -Dye Allergies.

## 2019-05-23 NOTE — Progress Notes (Signed)
[name] [name]. - 85 y.o. male MRN [id number]  Date of birth: 11/09/1922  Office Visit Note: Visit Date: 05/23/2019 PCP: [name] Curry, DO Referred by: [name] Curry, DO  Subjective: Chief Complaint  Patient presents with   Left Thigh - Pain   HPI: [name] [name]. is a 85 y.o. male who comes in today 1 month status post repeat left L5 transforaminal epidural steroid injection.  He has had some aggravating time with this left leg pain and paresthesia.  He has been classically L5 distribution pain both around the hip and thigh but particularly below the knee into the shin and foot.  Initially when I first saw him L5 injection seem to help quite a bit for several months and then we just never have since that time been able to get any relief with him.  We had gotten to the point where we repeated MRI and this again showed foraminal narrowing at L5-S1 mainly there is some central canal stenosis moderate at L4-5.  We ended up repeating the injection at that point still no ability to get him comfortable.  We tried different medications which she is just not been tolerant of.  This last injection performed from an infra neural approach and really tried to get the needle tip at the 6 o'clock position and get good contrast flow and it appears to be hopefully for now the secret to getting him better.  He reports his pain is a 2 out of 10 at this point he is walking much better.  He is not really complaining today of pain down past the knee is mainly hip and thigh area.  Again were 1 month out and he still doing pretty well.  Exam today shows that he ambulates with a walker he has some swelling in both legs with legs wrapped.  This is an ongoing issue with him.  He has no new weakness.  He has had no falls.  He is doing quite well otherwise.  We will see him back as needed hopefully at several months and we can repeat the injection knowing now exactly how to hopefully find the results we want.  It is  also diagnostic and that he got so much relief that we know the reason his leg is hurting is the L5 nerve root.  Just as a break from for future visits if needed we were going to look at pulsed radiofrequency ablation of the L5 dorsal root.  This procedure has some efficacy in the literature but is not accepted procedure but it is safe.  We also are going to potentially look at electrodiagnostic study of the lower limb particular look at needle EMG to see if there is any nerve root damage.  I think with the amount of relief he got with the injection and likely shows only mild damage if it anything there.  We will see him back as needed.  ROS Otherwise per HPI.  Assessment & Plan: Visit Diagnoses:  1. Lumbar radiculopathy   2. Foraminal stenosis of lumbar region     Plan: No additional findings.   Meds & Orders: No orders of the defined types were placed in this encounter.  No orders of the defined types were placed in this encounter.   Follow-up: Return if symptoms worsen or fail to improve.   Procedures: No procedures performed  No notes on file   Clinical History: Lumbar spine MRI dated 2018 is completed at [facility].  At L4-5 there is moderate multifactorial stenosis with foraminal narrowing and disc protrusion on the left.  At L5-S1 there is facet arthropathy which is pretty significant bilaterally with facet joint cyst on the left.   He reports that he quit smoking about 30 years ago. His smoking use included pipe. He has never used smokeless tobacco. No results for input(s): HGBA1C, LABURIC in the last 8760 hours.  Objective:  VS:  HT:     WT:    BMI:      BP:(!) 175/77   HR:(!) 51bpm   TEMP: ( )   RESP:  Physical Exam  Ortho Exam Imaging: No results found.  Past Medical/Family/Surgical/Social History: Medications & Allergies reviewed per EMR, new medications updated. 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 ([place]) 03/27/2018   Chronic left-sided low back pain with left-sided sciatica 03/27/2018   History of complete ray amputation of first toe of right foot ([place]) 12/28/2017   Idiopathic chronic venous hypertension of both lower extremities with inflammation 12/13/2017   Thrush 11/29/2017   Cellulitis of right foot 11/18/2017   Cellulitis of right ankle 11/18/2017   Adrenal insufficiency ([place]) 07/01/2017   Bulging of intervertebral disc between L4 and L5 07/01/2017   At high risk for bleeding 07/01/2017   Right buttock pain 02/08/2017   Leukocytosis 10/12/2016   HLD (hyperlipidemia) 04/20/2016   BPH (benign prostatic hyperplasia) 12/08/2015   CKD (chronic kidney disease) stage 3, GFR 30-59 ml/min (HCC) 11/27/2015   S/P total knee arthroplasty 07/13/2015   OA (osteoarthritis) of knee 06/29/2015   Ectropion 05/06/2015   Lateral meniscal tear 02/05/2015   Hyponatremia 10/20/2013   Weakness 10/19/2013   Hypertension    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[place])    Hypothyroidism (acquired)    Anemia, B12 deficiency    Meibomian gland disease 08/06/2013   Primary open angle glaucoma 06/19/2013   Arthritis of shoulder region, right 12/15/2011   Exposure keratitis 08/17/2011   Dry eye 07/13/2011   Past Medical History:  Diagnosis Date   Allergic rhinitis    Prior allergy shots 20 years   Anemia    Anemia, B12 deficiency 2000   Arthritis    Status post left total replacement 8 2011   BPH (benign prostatic hyperplasia) 2013   Cancer ([place])    renal cell ca and skin cancer    Colitis 2014   Difficult intubation 1994   surgery had to be  stopped due to  injury to "throat"   Difficult intubation 1994   no trouble since.  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[place]) 2000   Treated with hormone replacement   Hypothyroidism (acquired) 2000   Nocturia    Osteomyelitis ([place])    right great  Osteomyelitis ([place])    RIGHT FOOT /TOE   Pituitary mass ([place]) 2000   S/p transphenoidal excision 05/1999 ([facility])   Vitamin D deficiency 2009   Family History  Problem Relation Age of Onset   Lung cancer Father    Anesthesia problems Neg Hx    Hypotension Neg Hx    Malignant hyperthermia Neg Hx    Pseudochol deficiency Neg Hx    Past Surgical History:  Procedure Laterality Date   AMPUTATION Right 12/20/2017   Procedure: RIGHT 1ST RAY AMPUTATION;  Surgeon: [name], MD;  Location: [place];  Service: Orthopedics;  Laterality: Right;   AMPUTATION Right 03/23/2018   Procedure: RIGHT 2ND TOE AMPUTATION;  Surgeon: [name], MD;  Location: [place];  Service: Orthopedics;  Laterality: Right;   BRAIN SURGERY  2000   pituatary gland removed .   CHOLECYSTECTOMY  1984   Ectropion surgery Bilateral 2006   EYE SURGERY  over last 6 yrs.     trabeculectomy...    EYE SURGERY   cat ext ou   JOINT REPLACEMENT  2011   knee left   KNEE ARTHROSCOPY Right 02/06/2015   Procedure: RIGHT ARTHROSCOPY KNEE WITH LATERAL MENSICAL  DEBRIDEMENT;  Surgeon: Gaynelle Arabian, MD;  Location: [facility];  Service: Orthopedics;  Laterality: Right;   Mohs procedure      for skin cancer on nose    NEPHRECTOMY Right 1994   Renal cell   REVERSE SHOULDER ARTHROPLASTY   12/15/2011   Procedure: REVERSE SHOULDER ARTHROPLASTY;  Surgeon: Marin Shutter, MD;  Location: [place] [facility];  Service: Orthopedics;  Laterality: Right;  right total reverse shoulder   TONSILLECTOMY     TOTAL KNEE ARTHROPLASTY Right 06/29/2015   Procedure: TOTAL KNEE ARTHROPLASTY;  Surgeon: Gaynelle Arabian, MD;  Location: [facility];  Service: Orthopedics;  Laterality: Right;   Transsphenoidal excision pituitary tumor  05/1999   A.Tommi Rumps, M.D.([facility])   Social History   Occupational History   Not on file  Tobacco Use   Smoking status: Former Smoker    Types: Pipe    Quit date: 09/26/1988    Years since quitting: 30.6   Smokeless tobacco: Never Used   Tobacco comment: about 20 years  Substance and Sexual Activity   Alcohol use: Yes    Comment: 2-3 drinks per week   Drug use: No   Sexual activity: Not Currently

## 2019-05-28 ENCOUNTER — Encounter: Payer: Self-pay | Admitting: Orthopedic Surgery

## 2019-05-28 NOTE — Progress Notes (Signed)
Office Visit Note   Patient: [name] [name].           Date of Birth: [birth year]           MRN: [id number] Visit Date: 05/14/2019              Requested by: [name], DO [address] [place],  [place] [address] PCP: [name], DO  Chief Complaint  Patient presents with  . Right Leg - Edema  . Left Leg - Edema      HPI: Patient is a 85 year old gentleman who complains of swelling in both lower extremities.  Patient states that he started on Friday taking Lasix 40 mg a day.  Patient states that his swelling improved a little but not completely resolved.  He states he elevates his legs uses compression stockings but complains of swelling worse in the right than the left lower extremity.  Patient states that he has been on myrbetriq for 6 months with a side effect is swelling.  Assessment & Plan: Visit Diagnoses:  1. Idiopathic chronic venous hypertension of both lower extremities with inflammation     Plan: Patient was given a prescription for [facility] to wrap his legs with compression wraps weekly to help facilitate decreasing edema in his legs then resume the compression stocking.  Follow-Up Instructions: Return if symptoms worsen or fail to improve.   Ortho Exam  Patient is alert, oriented, no adenopathy, well-dressed, normal affect, normal respiratory effort. Examination patient has pitting edema in both lower extremities there are no open ulcers no cellulitis no drainage no signs of infection no signs of DVT range of motion of the ankle is asymptomatic.  Swelling worse on the right than the left leg.  Imaging: No results found. No images are attached to the encounter.  Labs: Lab Results  Component Value Date   HGBA1C 6.5 (H) 03/20/2018   HGBA1C 6.2 (H) 11/18/2017   ESRSEDRATE 35 (H) 11/29/2017   ESRSEDRATE 57 (H) 11/18/2017   CRP 11.4 (H) 11/29/2017   CRP 8.5 (H) 11/18/2017   REPTSTATUS 03/25/2018 FINAL 03/20/2018   CULT  03/20/2018    NO  GROWTH 5 DAYS Performed at [facility], [place] [address] [place], [place] [address]      Lab Results  Component Value Date   ALBUMIN 2.4 (L) 03/21/2018   ALBUMIN 3.2 (L) 03/20/2018   ALBUMIN 2.7 (L) 11/29/2017   PREALBUMIN 14.7 (L) 11/18/2017    Lab Results  Component Value Date   MG 2.0 03/20/2018   MG 1.9 10/14/2016   MG 1.8 10/13/2016   Lab Results  Component Value Date   VD25OH 46 06/12/2017    Lab Results  Component Value Date   PREALBUMIN 14.7 (L) 11/18/2017   CBC EXTENDED Latest Ref Rng & Units 10/15/2018 03/24/2018 03/23/2018  WBC - 9.0 15.0(H) 11.6(H)  RBC 4.22 - 5.81 MIL/uL - 3.67(L) 3.64(L)  HGB 13.5 - 17.5 12.7(A) 11.7(L) 11.5(L)  HCT 41 - 53 38(A) 36.2(L) 35.9(L)  PLT 150 - 399 243 277 286  NEUTROABS 1.7 - 7.7 K/uL - - -  LYMPHSABS 0.7 - 4.0 K/uL - - -     Body mass index is 23.33 kg/m.  Orders:  No orders of the defined types were placed in this encounter.  No orders of the defined types were placed in this encounter.    Procedures: No procedures performed  Clinical Data: No additional findings.  ROS:  All other systems negative,  except as noted in the HPI. Review of Systems  Objective: Vital Signs: Ht 6' (1.829 m)   Wt 172 lb (78 kg)   BMI 23.33 kg/m   Specialty Comments:  No specialty comments available.  PMFS History: Patient Active Problem List   Diagnosis Date Noted  . Lumbar radiculopathy 02/25/2019  . Spinal stenosis of lumbar region with neurogenic claudication 02/25/2019  . Foraminal stenosis of lumbar region 02/25/2019  . Diabetic polyneuropathy associated with type 2 diabetes mellitus ([place]) 11/14/2018  . Balance problem 07/11/2018  . History of complete ray amputation of second toe of right foot ([place]) 03/27/2018  . Chronic left-sided low back pain with left-sided sciatica 03/27/2018  . History of complete ray amputation of first toe of right foot ([place]) 12/28/2017  . Idiopathic chronic venous hypertension of  both lower extremities with inflammation 12/13/2017  . Thrush 11/29/2017  . Cellulitis of right foot 11/18/2017  . Cellulitis of right ankle 11/18/2017  . Adrenal insufficiency ([place]) 07/01/2017  . Bulging of intervertebral disc between L4 and L5 07/01/2017  . At high risk for bleeding 07/01/2017  . Right buttock pain 02/08/2017  . Leukocytosis 10/12/2016  . HLD (hyperlipidemia) 04/20/2016  . BPH (benign prostatic hyperplasia) 12/08/2015  . CKD (chronic kidney disease) stage 3, GFR 30-59 ml/min (HCC) 11/27/2015  . S/P total knee arthroplasty 07/13/2015  . OA (osteoarthritis) of knee 06/29/2015  . Ectropion 05/06/2015  . Lateral meniscal tear 02/05/2015  . Hyponatremia 10/20/2013  . Weakness 10/19/2013  . Hypertension   . 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 Hypothyroidism (acquired)   . Anemia, B12 deficiency   . Meibomian gland disease 08/06/2013  . Primary open angle glaucoma 06/19/2013  . Arthritis of shoulder region, right 12/15/2011  . Exposure keratitis 08/17/2011  . Dry eye 07/13/2011   Past Medical History:  Diagnosis Date  . Allergic rhinitis    Prior allergy shots 20 years  . Anemia   . Anemia, B12 deficiency 2000  . Arthritis    Status post left total replacement 8 2011  . BPH (benign prostatic hyperplasia) 2013  . Cancer ([place])    renal cell ca and skin cancer   . Colitis 2014  . Difficult intubation 1994   surgery had to be  stopped due to injury to "throat"  . Difficult intubation 1994   no trouble since.  Required nasotracheal intubation  '02 [facility] / ANESTHESIA RECORD FROM 2013 AND 2016 IN EPIC  . GERD (gastroesophageal reflux disease)   . Glaucoma 2007   Status post left trabeculectomy 2007  . History of colon polyps    Colonoscopy 2001  . History of kidney stones   . History of renal cell carcinoma 1994   Status post right nephrectomy  . Hyperlipidemia 2003  . Hypertension   . 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 Hypothyroidism (acquired) 2000  . Nocturia   . Osteomyelitis ([place])    right great  . Osteomyelitis ([place])    RIGHT FOOT /TOE  . Pituitary mass ([place]) 2000   S/p transphenoidal excision 05/1999 ([facility])  . Vitamin D deficiency 2009    Family History  Problem Relation Age of Onset  . Lung cancer Father   . Anesthesia problems Neg Hx   . Hypotension Neg Hx   . Malignant hyperthermia Neg Hx   . Pseudochol deficiency Neg Hx     Past Surgical History:  Procedure Laterality Date  . AMPUTATION  Right 12/20/2017   Procedure: RIGHT 1ST RAY AMPUTATION;  Surgeon: [name], MD;  Location: [place];  Service: Orthopedics;  Laterality: Right;  . AMPUTATION Right 03/23/2018   Procedure: RIGHT 2ND TOE AMPUTATION;  Surgeon: [name], MD;  Location: [place];  Service: Orthopedics;  Laterality: Right;  . BRAIN SURGERY  2000   pituatary gland removed .  Marland Kitchen CHOLECYSTECTOMY  1984  . Ectropion surgery Bilateral 2006  . EYE SURGERY  over last 6 yrs.     trabeculectomy...   . EYE SURGERY   cat ext ou  . JOINT REPLACEMENT  2011   knee left  . KNEE ARTHROSCOPY Right 02/06/2015   Procedure: RIGHT ARTHROSCOPY KNEE WITH LATERAL MENSICAL  DEBRIDEMENT;  Surgeon: Gaynelle Arabian, MD;  [facility];  Service: Orthopedics;  Laterality: Right;  . Mohs procedure      for skin cancer on nose   . NEPHRECTOMY Right 1994   Renal cell  . REVERSE SHOULDER ARTHROPLASTY  12/15/2011   Procedure: REVERSE SHOULDER ARTHROPLASTY;  Surgeon: Marin Shutter, MD;  Location: [facility];  Service: Orthopedics;  Laterality: Right;  right total reverse shoulder  . TONSILLECTOMY    . TOTAL KNEE ARTHROPLASTY Right 06/29/2015   Procedure: TOTAL KNEE ARTHROPLASTY;  Surgeon: Gaynelle Arabian, MD;  [facility];  Service: Orthopedics;  Laterality: Right;  . Transsphenoidal excision pituitary tumor  05/1999   A.Tommi Rumps, M.D.([facility])   Social History   Occupational History  . Not on file  Tobacco  Use  . Smoking status: Former Smoker    Types: Pipe    Quit date: 09/26/1988    Years since quitting: 30.6  . Smokeless tobacco: Never Used  . Tobacco comment: about 20 years  Substance and Sexual Activity  . Alcohol use: Yes    Comment: 2-3 drinks per week  . Drug use: No  . Sexual activity: Not Currently

## 2019-05-30 DIAGNOSIS — I10 Essential (primary) hypertension: Secondary | ICD-10-CM | POA: Diagnosis not present

## 2019-05-30 DIAGNOSIS — D510 Vitamin B12 deficiency anemia due to intrinsic factor deficiency: Secondary | ICD-10-CM | POA: Diagnosis not present

## 2019-05-30 DIAGNOSIS — E230 Hypopituitarism: Secondary | ICD-10-CM | POA: Diagnosis not present

## 2019-05-30 DIAGNOSIS — E7800 Pure hypercholesterolemia, unspecified: Secondary | ICD-10-CM | POA: Diagnosis not present

## 2019-05-30 DIAGNOSIS — E559 Vitamin D deficiency, unspecified: Secondary | ICD-10-CM | POA: Diagnosis not present

## 2019-05-30 DIAGNOSIS — M7989 Other specified soft tissue disorders: Secondary | ICD-10-CM | POA: Diagnosis not present

## 2019-05-30 NOTE — Telephone Encounter (Signed)
Tried another attempt to contact patient to discuss Dr. Cyndi Lennert response. No answer. LMOM for patient to return call. Looked in patient's chart and seen he is being treated for leg swelling by another provider. No further action needed at this time.

## 2019-06-03 NOTE — Telephone Encounter (Signed)
Closing encounter

## 2019-06-05 DIAGNOSIS — L718 Other rosacea: Secondary | ICD-10-CM | POA: Diagnosis not present

## 2019-06-05 DIAGNOSIS — H02889 Meibomian gland dysfunction of unspecified eye, unspecified eyelid: Secondary | ICD-10-CM | POA: Diagnosis not present

## 2019-06-05 DIAGNOSIS — H16213 Exposure keratoconjunctivitis, bilateral: Secondary | ICD-10-CM | POA: Diagnosis not present

## 2019-06-05 DIAGNOSIS — H401133 Primary open-angle glaucoma, bilateral, severe stage: Secondary | ICD-10-CM | POA: Diagnosis not present

## 2019-07-03 DIAGNOSIS — R2681 Unsteadiness on feet: Secondary | ICD-10-CM | POA: Diagnosis not present

## 2019-07-03 DIAGNOSIS — M5442 Lumbago with sciatica, left side: Secondary | ICD-10-CM | POA: Diagnosis not present

## 2019-07-05 ENCOUNTER — Other Ambulatory Visit: Payer: Self-pay

## 2019-07-05 ENCOUNTER — Ambulatory Visit (INDEPENDENT_AMBULATORY_CARE_PROVIDER_SITE_OTHER): Payer: PPO | Admitting: Family

## 2019-07-05 ENCOUNTER — Encounter: Payer: Self-pay | Admitting: Family

## 2019-07-05 DIAGNOSIS — Z0000 Encounter for general adult medical examination without abnormal findings: Secondary | ICD-10-CM

## 2019-07-05 NOTE — Patient Instructions (Signed)
[name] [name] , Thank you for taking time to come for your Medicare Wellness Visit. I appreciate your ongoing commitment to your health goals. Please review the following plan we discussed and let me know if I can assist you in the future.   Screening recommendations/referrals: Colonoscopy: Age Out  Recommended yearly ophthalmology/optometry visit for glaucoma screening and checkup Recommended yearly dental visit for hygiene and checkup  Vaccinations: Influenza vaccine: Up to date  Pneumococcal vaccine : Up to date  Tdap vaccine : Up to date  Shingles vaccine: Up to date    Advanced directives: yes   Conditions/risks identified: Advance age male > 55 yrs,hx of smoking,male Gender   Next appointment: 1 year   Preventive Care 22 Years and Older, Male Preventive care refers to lifestyle choices and visits with your health care provider that can promote health and wellness. What does preventive care include?  A yearly physical exam. This is also called an annual well check.  Dental exams once or twice a year.  Routine eye exams. Ask your health care provider how often you should have your eyes checked.  Personal lifestyle choices, including:  Daily care of your teeth and gums.  Regular physical activity.  Eating a healthy diet.  Avoiding tobacco and drug use.  Limiting alcohol use.  Practicing safe sex.  Taking low doses of aspirin every day.  Taking vitamin and mineral supplements as recommended by your health care provider. What happens during an annual well check? The services and screenings done by your health care provider during your annual well check will depend on your age, overall health, lifestyle risk factors, and family history of disease. Counseling  Your health care provider may ask you questions about your:  Alcohol use.  Tobacco use.  Drug use.  Emotional well-being.  Home and relationship well-being.  Sexual activity.  Eating habits.   History of falls.  Memory and ability to understand (cognition).  Work and work Statistician. Screening  You may have the following tests or measurements:  Height, weight, and BMI.  Blood pressure.  Lipid and cholesterol levels. These may be checked every 5 years, or more frequently if you are over 42 years old.  Skin check.  Lung cancer screening. You may have this screening every year starting at age 75 if you have a 30-pack-year history of smoking and currently smoke or have quit within the past 15 years.  Fecal occult blood test (FOBT) of the stool. You may have this test every year starting at age 60.  Flexible sigmoidoscopy or colonoscopy. You may have a sigmoidoscopy every 5 years or a colonoscopy every 10 years starting at age 3.  Prostate cancer screening. Recommendations will vary depending on your family history and other risks.  Hepatitis C blood test.  Hepatitis B blood test.  Sexually transmitted disease (STD) testing.  Diabetes screening. This is done by checking your blood sugar (glucose) after you have not eaten for a while (fasting). You may have this done every 1-3 years.  Abdominal aortic aneurysm (AAA) screening. You may need this if you are a current or former smoker.  Osteoporosis. You may be screened starting at age 23 if you are at high risk. Talk with your health care provider about your test results, treatment options, and if necessary, the need for more tests. Vaccines  Your health care provider may recommend certain vaccines, such as:  Influenza vaccine. This is recommended every year.  Tetanus, diphtheria, and acellular pertussis (Tdap, Td) vaccine.  You may need a Td booster every 10 years.  Zoster vaccine. You may need this after age 56.  Pneumococcal 13-valent conjugate (PCV13) vaccine. One dose is recommended after age 28.  Pneumococcal polysaccharide (PPSV23) vaccine. One dose is recommended after age 58. Talk to your health care  provider about which screenings and vaccines you need and how often you need them. This information is not intended to replace advice given to you by your health care provider. Make sure you discuss any questions you have with your health care provider. Document Released: 09/11/2015 Document Revised: 05/04/2016 Document Reviewed: 06/16/2015 Elsevier Interactive Patient Education  2017 [place] Prevention in the Home Falls can cause injuries. They can happen to people of all ages. There are many things you can do to make your home safe and to help prevent falls. What can I do on the outside of my home?  Regularly fix the edges of walkways and driveways and fix any cracks.  Remove anything that might make you trip as you walk through a door, such as a raised step or threshold.  Trim any bushes or trees on the path to your home.  Use bright outdoor lighting.  Clear any walking paths of anything that might make someone trip, such as rocks or tools.  Regularly check to see if handrails are loose or broken. Make sure that both sides of any steps have handrails.  Any raised decks and porches should have guardrails on the edges.  Have any leaves, snow, or ice cleared regularly.  Use sand or salt on walking paths during winter.  Clean up any spills in your garage right away. This includes oil or grease spills. What can I do in the bathroom?  Use night lights.  Install grab bars by the toilet and in the tub and shower. Do not use towel bars as grab bars.  Use non-skid mats or decals in the tub or shower.  If you need to sit down in the shower, use a plastic, non-slip stool.  Keep the floor dry. Clean up any water that spills on the floor as soon as it happens.  Remove soap buildup in the tub or shower regularly.  Attach bath mats securely with double-sided non-slip rug tape.  Do not have throw rugs and other things on the floor that can make you trip. What can I do in  the bedroom?  Use night lights.  Make sure that you have a light by your bed that is easy to reach.  Do not use any sheets or blankets that are too big for your bed. They should not hang down onto the floor.  Have a firm chair that has side arms. You can use this for support while you get dressed.  Do not have throw rugs and other things on the floor that can make you trip. What can I do in the kitchen?  Clean up any spills right away.  Avoid walking on wet floors.  Keep items that you use a lot in easy-to-reach places.  If you need to reach something above you, use a strong step stool that has a grab bar.  Keep electrical cords out of the way.  Do not use floor polish or wax that makes floors slippery. If you must use wax, use non-skid floor wax.  Do not have throw rugs and other things on the floor that can make you trip. What can I do with my stairs?  Do not leave any  items on the stairs.  Make sure that there are handrails on both sides of the stairs and use them. Fix handrails that are broken or loose. Make sure that handrails are as long as the stairways.  Check any carpeting to make sure that it is firmly attached to the stairs. Fix any carpet that is loose or worn.  Avoid having throw rugs at the top or bottom of the stairs. If you do have throw rugs, attach them to the floor with carpet tape.  Make sure that you have a light switch at the top of the stairs and the bottom of the stairs. If you do not have them, ask someone to add them for you. What else can I do to help prevent falls?  Wear shoes that:  Do not have high heels.  Have rubber bottoms.  Are comfortable and fit you well.  Are closed at the toe. Do not wear sandals.  If you use a stepladder:  Make sure that it is fully opened. Do not climb a closed stepladder.  Make sure that both sides of the stepladder are locked into place.  Ask someone to hold it for you, if possible.  Clearly mark and  make sure that you can see:  Any grab bars or handrails.  First and last steps.  Where the edge of each step is.  Use tools that help you move around (mobility aids) if they are needed. These include:  Canes.  Walkers.  Scooters.  Crutches.  Turn on the lights when you go into a dark area. Replace any light bulbs as soon as they burn out.  Set up your furniture so you have a clear path. Avoid moving your furniture around.  If any of your floors are uneven, fix them.  If there are any pets around you, be aware of where they are.  Review your medicines with your doctor. Some medicines can make you feel dizzy. This can increase your chance of falling. Ask your doctor what other things that you can do to help prevent falls. This information is not intended to replace advice given to you by your health care provider. Make sure you discuss any questions you have with your health care provider. Document Released: 06/11/2009 Document Revised: 01/21/2016 Document Reviewed: 09/19/2014 Elsevier Interactive Patient Education  2017 [employer].

## 2019-07-05 NOTE — Progress Notes (Signed)
This service is provided via telemedicine  No vital signs collected/recorded due to the encounter was a telemedicine visit.   Location of patient (ex: home, work):  Home   Patient consents to a telephone visit:  Yes  Location of the provider (ex: office, home):  Office   Name of any referring provider:  Loma [place]   Names of all persons participating in the telemedicine service and their role in the encounter: Dinah Ngetich NP, Ruthell Rummage CMA, and patient   Time spent on call:  Ruthell Rummage CMA spent 11 minutes on phone with patient.    Subjective:   Travarus Cerros. is a 85 y.o. male who presents for Medicare Annual/Subsequent preventive examination.  Review of Systems:  Cardiac Risk Factors include: advanced age (>79men, >24 women);smoking/ tobacco exposure;male gender     Objective:    Vitals: There were no vitals taken for this visit.  There is no height or weight on file to calculate BMI.  Advanced Directives 07/03/2018 03/27/2018 03/20/2018 03/20/2018 03/07/2018 01/09/2018 12/26/2017  Does Patient Have a Medical Advance Directive? Yes Yes - Yes Yes Yes Yes  Type of Advance Directive [place];Out of facility DNR (pink MOST or yellow form) [place];Living will;Out of facility DNR (pink MOST or yellow form) - [place];Out of facility DNR (pink MOST or yellow form) [place];Out of facility DNR (pink MOST or yellow form) [place];Out of facility DNR (pink MOST or yellow form) Out of facility DNR (pink MOST or yellow form);Healthcare Power of Attorney  Does patient want to make changes to medical advance directive? No - Patient declined No - Patient declined No - Patient declined - No - Patient declined No - Patient declined No - Patient declined  Copy of [place] in Chart? Yes Yes - - Yes Yes Yes  Would patient like information on creating a medical  advance directive? - - - - - - -  Pre-existing out of facility DNR order (yellow form or pink MOST form) Yellow form placed in chart (order not valid for inpatient use);Pink MOST form placed in chart (order not valid for inpatient use) Yellow form placed in chart (order not valid for inpatient use);Pink MOST form placed in chart (order not valid for inpatient use) - - Yellow form placed in chart (order not valid for inpatient use);Pink MOST form placed in chart (order not valid for inpatient use) Yellow form placed in chart (order not valid for inpatient use);Pink MOST form placed in chart (order not valid for inpatient use) Yellow form placed in chart (order not valid for inpatient use)    Tobacco Social History   Tobacco Use  Smoking Status Former Smoker  . Types: Pipe  . Quit date: 09/26/1988  . Years since quitting: 30.7  Smokeless Tobacco Never Used  Tobacco Comment   about 20 years     Counseling given: Not Answered Comment: about 20 years   Clinical Intake:  Pre-visit preparation completed: No  Pain : No/denies pain     BMI - recorded: 23.33  How often do you need to have someone help you when you read instructions, pamphlets, or other written materials from your doctor or pharmacy?: 1 - Never What is the last grade level you completed in school?: Graduate school  Interpreter Needed?: No  Information entered by :: Dinah Ngetich FNP-C  Past Medical History:  Diagnosis Date  . Allergic rhinitis  Prior allergy shots 20 years  . Anemia   . Anemia, B12 deficiency 2000  . Arthritis    Status post left total replacement 8 2011  . BPH (benign prostatic hyperplasia) 2013  . Cancer ([place])    renal cell ca and skin cancer   . Colitis 2014  . Difficult intubation 1994   surgery had to be  stopped due to injury to "throat"  . Difficult intubation 1994   no trouble since.  Required nasotracheal intubation  '02 [facility] / ANESTHESIA RECORD FROM 2013 AND 2016 IN EPIC  . GERD  (gastroesophageal reflux disease)   . Glaucoma 2007   Status post left trabeculectomy 2007  . History of colon polyps    Colonoscopy 2001  . History of kidney stones   . History of renal cell carcinoma 1994   Status post right nephrectomy  . Hyperlipidemia 2003  . Hypertension   . 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 Hypothyroidism (acquired) 2000  . Nocturia   . Osteomyelitis ([place])    right great  . Osteomyelitis ([place])    RIGHT FOOT /TOE  . Pituitary mass ([place]) 2000   S/p transphenoidal excision 05/1999 ([facility])  . Vitamin D deficiency 2009   Past Surgical History:  Procedure Laterality Date  . AMPUTATION Right 12/20/2017   Procedure: RIGHT 1ST RAY AMPUTATION;  Surgeon: [name], MD;  Location: [place];  Service: Orthopedics;  Laterality: Right;  . AMPUTATION Right 03/23/2018   Procedure: RIGHT 2ND TOE AMPUTATION;  Surgeon: [name], MD;  Location: [place];  Service: Orthopedics;  Laterality: Right;  . BRAIN SURGERY  2000   pituatary gland removed .  Marland Kitchen CHOLECYSTECTOMY  1984  . Ectropion surgery Bilateral 2006  . EYE SURGERY  over last 6 yrs.     trabeculectomy...   . EYE SURGERY   cat ext ou  . JOINT REPLACEMENT  2011   knee left  . KNEE ARTHROSCOPY Right 02/06/2015   Procedure: RIGHT ARTHROSCOPY KNEE WITH LATERAL MENSICAL  DEBRIDEMENT;  Surgeon: Gaynelle Arabian, MD;  Location: [facility];  Service: Orthopedics;  Laterality: Right;  . Mohs procedure      for skin cancer on nose   . NEPHRECTOMY Right 1994   Renal cell  . REVERSE SHOULDER ARTHROPLASTY  12/15/2011   Procedure: REVERSE SHOULDER ARTHROPLASTY;  Surgeon: Marin Shutter, MD;  Location: [place];  Service: Orthopedics;  Laterality: Right;  right total reverse shoulder  . TONSILLECTOMY    . TOTAL KNEE ARTHROPLASTY Right 06/29/2015   Procedure: TOTAL KNEE ARTHROPLASTY;  Surgeon: Gaynelle Arabian, MD;  Location: [facility];  Service: Orthopedics;  Laterality: Right;  .  Transsphenoidal excision pituitary tumor  05/1999   A.Tommi Rumps, M.D.([facility])   Family History  Problem Relation Age of Onset  . Lung cancer Father   . Anesthesia problems Neg Hx   . Hypotension Neg Hx   . Malignant hyperthermia Neg Hx   . Pseudochol deficiency Neg Hx    Social History   Socioeconomic History  . Marital status: Widowed    Spouse name: Not on file  . Number of children: Not on file  . Years of education: Not on file  . Highest education level: Not on file  Occupational History  . Not on file  Social Needs  . Financial resource strain: Not hard at all  . Food insecurity    Worry: Never true    Inability: Never true  .  Transportation needs    Medical: No    Non-medical: No  Tobacco Use  . Smoking status: Former Smoker    Types: Pipe    Quit date: 09/26/1988    Years since quitting: 30.7  . Smokeless tobacco: Never Used  . Tobacco comment: about 20 years  Substance and Sexual Activity  . Alcohol use: Yes    Comment: 2-3 drinks per week  . Drug use: No  . Sexual activity: Not Currently  Lifestyle  . Physical activity    Days per week: 4 days    Minutes per session: 30 min  . Stress: Not at all  Relationships  . Social connections    Talks on phone: More than three times a week    Gets together: More than three times a week    Attends religious service: More than 4 times per year    Active member of club or organization: No    Attends meetings of clubs or organizations: Never    Relationship status: Widowed  Other Topics Concern  . Not on file  Social History Narrative   Patient is Married Software engineer). Retired Engineer, mining) Administrator, sports. Lives in apartment, Independent Living section at [place] since 2000.  Spouse lives in skilled nursing section in same community   Smoking 1994 , moderate alcohol use: 2 drinks a night. Exercises regularly with walking and exercise classes. Continues to drive   Patient has  Advanced planning documents: Living Will                Outpatient Encounter Medications as of 07/05/2019  Medication Sig  . acetaminophen (TYLENOL) 500 MG tablet Take 500 mg by mouth daily as needed for fever.   . Cholecalciferol (VITAMIN D) 2000 units tablet Take 2,000 Units by mouth daily.  . Coenzyme Q10 300 MG CAPS Take 300 mg by mouth daily.   . Diclofenac Sodium 1.5 % SOLN Dispense 10 to 40 drops to affected area on skin as directed, twice per day  . finasteride (PROSCAR) 5 MG tablet Take 5 mg by mouth daily.   . Flaxseed, Linseed, (EQL FLAXSEED OIL) 1200 MG CAPS Take as directed once a day  . Multiple Vitamins-Minerals (MULTIVITAMIN & MINERAL PO) Take 1 tablet by mouth daily.  . Omega-3 Fatty Acids (OMEGA-3 FISH OIL PO) Take 700 mg by mouth daily.  . pravastatin (PRAVACHOL) 80 MG tablet Take 80 mg by mouth every other day.   . predniSONE (DELTASONE) 5 MG tablet Take 2.5-5 mg by mouth See admin instructions. 5mg  in the morning and 2.5mg  in the evening  . SYNTHROID 88 MCG tablet Take 88 mcg by mouth daily before breakfast.   . testosterone cypionate (DEPOTESTOSTERONE CYPIONATE) 200 MG/ML injection Inject 30 mg (0.15 ml) intramuscularly weekly. Must discard vial after 90 days.  Marland Kitchen timolol (TIMOPTIC) 0.5 % ophthalmic solution Place 1 drop into the right eye daily.  . vitamin B-12 (CYANOCOBALAMIN) 500 MCG tablet Take 500 mcg by mouth daily.  . [DISCONTINUED] docusate sodium (COLACE) 100 MG capsule Take 200 mg by mouth at bedtime.  . [DISCONTINUED] DULoxetine (CYMBALTA) 60 MG capsule Take 1 capsule (60 mg total) by mouth daily. May take two weeks before effect, stop if increase balance problems   Facility-Administered Encounter Medications as of 07/05/2019  Medication  . mupirocin cream (BACTROBAN) 2 %    Activities of Daily Living In your present state of health, do you have any difficulty performing the following activities: 07/05/2019  Hearing? Darreld Mclean  Comment wears hearing aids   Vision? Y  Difficulty concentrating or making decisions? N  Walking or climbing stairs? Y  Comment uses a walker  Dressing or bathing? N  Doing errands, shopping? N  Preparing Food and eating ? Y  Comment eats at the facility  Using the Toilet? N  In the past six months, have you accidently leaked urine? N  Do you have problems with loss of bowel control? N  Managing your Medications? N  Managing your Finances? N  Housekeeping or managing your Housekeeping? Y  Comment facility assist  Some recent data might be hidden    Patient Care Team: [name], DO as PCP - General (Geriatric Medicine) [name], [name], MD as Referring Physician (Endocrinology) [name], MD as Consulting Physician (Orthopedic Surgery) Bond, Tracie Harrier, MD as Referring Physician (Ophthalmology) Magnus Sinning, MD as Consulting Physician (Physical Medicine and Rehabilitation)   Assessment:   This is a routine wellness examination for [facility].  Exercise Activities and Dietary recommendations Current Exercise Habits: Home exercise routine, Type of exercise: Other - see comments(squats), Time (Minutes): 10, Frequency (Times/Week): 7, Weekly Exercise (Minutes/Week): 70, Intensity: Mild, Exercise limited by: None identified  Goals    . Exercise 3x per week (30 min per time)     I would like to walk more.       Fall Risk Fall Risk  07/05/2019 03/13/2019 11/14/2018 07/11/2018 07/03/2018  Falls in the past year? 0 0 1 0 0  Number falls in past yr: 0 0 0 0 0  Comment - - - - -  Injury with Fall? 0 0 1 0 0  Risk Factor Category  - - - - -  Risk for fall due to : - - - - -   Is the patient's home free of loose throw rugs in walkways, pet beds, electrical cords, etc?   no      Grab bars in the bathroom? yes      Handrails on the stairs?   no  Stairs       Adequate lighting?   yes  Depression Screen PHQ 2/9 Scores 07/05/2019 03/13/2019 11/14/2018 07/03/2018  PHQ  - 2 Score 0 0 0 0    Cognitive Function MMSE - Mini Mental State Exam 07/03/2018 06/20/2017  Orientation to time 5 5  Orientation to Place 5 5  Registration 3 3  Attention/ Calculation 5 5  Recall 3 2  Language- name 2 objects 2 2  Language- repeat 1 1  Language- follow 3 step command 3 3  Language- read & follow direction 1 1  Write a sentence 1 1  Copy design 1 1  Total score 30 29     6CIT Screen 07/05/2019  What Year? 0 points  What month? 0 points  What time? 0 points  Count back from 20 0 points  Months in reverse 0 points  Repeat phrase 2 points  Total Score 2    Immunization History  Administered Date(s) Administered  . Fluad Quad(high Dose 65+) 04/30/2019  . Influenza, High Dose Seasonal PF 04/30/2017, 06/08/2018  . Influenza, Quadrivalent, Recombinant, Inj, Pf 05/19/2016  . Pneumococcal Conjugate-13 06/04/2015  . Pneumococcal Polysaccharide-23 06/29/2010  . Tdap 10/28/1998, 06/08/2018  . Zoster 10/27/2005  . Zoster Recombinat (Shingrix) 02/04/2018, 07/02/2018    Qualifies for Shingles Vaccine? Up to date   Screening Tests Health Maintenance  Topic Date Due  . FOOT EXAM  01/22/1933  . OPHTHALMOLOGY EXAM  01/22/1933  . URINE MICROALBUMIN  01/22/1933  . HEMOGLOBIN A1C  09/20/2018  . TETANUS/TDAP  06/08/2028  . INFLUENZA VACCINE  Completed  . PNA vac Low Risk Adult  Completed   Cancer Screenings: Lung: Low Dose CT Chest recommended if Age 16-80 years, 30 pack-year currently smoking OR have quit w/in 15years. Patient does not qualify. Colorectal: Age out   Additional Screenings:  Hepatitis C Screening:Low Risk    Plan:   I have personally reviewed and noted the following in the patient's chart:   . Medical and social history . Use of alcohol, tobacco or illicit drugs  . Current medications and supplements . Functional ability and status . Nutritional status . Physical activity . Advanced directives . List of other  physicians . Hospitalizations, surgeries, and ER visits in previous 12 months . Vitals . Screenings to include cognitive, depression, and falls . Referrals and appointments  In addition, I have reviewed and discussed with patient certain preventive protocols, quality metrics, and best practice recommendations. A written personalized care plan for preventive services as well as general preventive health recommendations were provided to patient.    Sandrea Hughs, NP  07/05/2019

## 2019-07-08 DIAGNOSIS — H401133 Primary open-angle glaucoma, bilateral, severe stage: Secondary | ICD-10-CM | POA: Diagnosis not present

## 2019-07-17 ENCOUNTER — Non-Acute Institutional Stay: Payer: PPO | Admitting: Internal Medicine

## 2019-07-17 ENCOUNTER — Other Ambulatory Visit: Payer: Self-pay

## 2019-07-17 ENCOUNTER — Encounter: Payer: Self-pay | Admitting: Internal Medicine

## 2019-07-17 VITALS — BP 134/82 | HR 76 | Temp 97.7°F | Ht 72.0 in | Wt 176.0 lb

## 2019-07-17 DIAGNOSIS — E2740 Unspecified adrenocortical insufficiency: Secondary | ICD-10-CM

## 2019-07-17 DIAGNOSIS — G8929 Other chronic pain: Secondary | ICD-10-CM

## 2019-07-17 DIAGNOSIS — I872 Venous insufficiency (chronic) (peripheral): Secondary | ICD-10-CM | POA: Diagnosis not present

## 2019-07-17 DIAGNOSIS — R2681 Unsteadiness on feet: Secondary | ICD-10-CM

## 2019-07-17 DIAGNOSIS — E893 Postprocedural hypopituitarism: Secondary | ICD-10-CM | POA: Diagnosis not present

## 2019-07-17 DIAGNOSIS — M5442 Lumbago with sciatica, left side: Secondary | ICD-10-CM | POA: Diagnosis not present

## 2019-07-17 NOTE — Progress Notes (Signed)
Location:  Occupational psychologist of Service:  Clinic (12)  Provider: Wilford Merryfield L. Mariea Clonts, D.O., C.M.D.  Code Status: DNR Goals of Care:  Advanced Directives 07/17/2019  Does Patient Have a Medical Advance Directive? Yes  Type of Paramedic of [place];Out of facility DNR (pink MOST or yellow form)  Does patient want to make changes to medical advance directive? No - Patient declined  Copy of [place] in Chart? Yes - validated most recent copy scanned in chart (See row information)  Would patient like information on creating a medical advance directive? -  Pre-existing out of facility DNR order (yellow form or pink MOST form) Yellow form placed in chart (order not valid for inpatient use);Pink MOST form placed in chart (order not valid for inpatient use)     Chief Complaint  Patient presents with  . Medical Management of Chronic Issues    4 month follow-up and discuss medication compliance for statin. Patient states swelling in legs drcreased.     HPI: Patient is a 85 y.o. male seen today for medical management of chronic diseases.    Statin med compliance--is on pravachol every other day reportedly.  Last ordered per epic chart 11/30/11, BUT sees Dr. Elyse Hsu at [facility] as endocrine, as well.  He did skip his pravachol for a month due to concern that it could be contributing to his swelling.  It showed up on his bloodwork.  Dr. Elyse Hsu advised him to restart daily.  Swelling in legs reportedly decreased per patient.  Of course, he's been seeing various specialists about this and I'm just getting notes about it.  This is much better now.  He uses his compression stockings all day and off at hs.  Denies any sores on his feet and Tootsie checks on them.  Does better with a large toebox--Samuel Shepherd shoes.    BP had been up when legs were swollen.  Wound care nurse was happy with their appearance today, as well.  No  shortness of breath.  He does walk to the dining room.    Left leg still gets discomfort radiating down lateral aspect from his back.  He gets epidurals--last one is starting to wear off.  This was his 8th.  Dr. Ernestina Patches with [facility].      Gets his labs thru Dr. Elyse Hsu.     Taking myrbetriq every other day rather than daily due to cost and he feels like it's working just as well this way so far.  He's trying to do squats and working on his upper arms.  Did that this morning.  Does 20 per day.    He does not want to fall, but he can walk straight and do ok.  If he turns, that's when he gets in trouble--uses his walker in his apt due to this.  He does use the walker to go to dinner.  Leg does hurt on outside after walking back from dinner.      Past Medical History:  Diagnosis Date  . Allergic rhinitis    Prior allergy shots 20 years  . Anemia   . Anemia, B12 deficiency 2000  . Arthritis    Status post left total replacement 8 2011  . BPH (benign prostatic hyperplasia) 2013  . Cancer ([place])    renal cell ca and skin cancer   . Colitis 2014  . Difficult intubation 1994   surgery had to be  stopped due to injury  to "throat"  . Difficult intubation 1994   no trouble since.  Required nasotracheal intubation  '[facility] / ANESTHESIA RECORD FROM 2013 AND 2016 IN EPIC  . GERD (gastroesophageal reflux disease)   . Glaucoma 2007   Status post left trabeculectomy 2007  . History of colon polyps    Colonoscopy 2001  . History of kidney stones   . History of renal cell carcinoma 1994   Status post right nephrectomy  . Hyperlipidemia 2003  . Hypertension   . 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 Hypothyroidism (acquired) 2000  . Nocturia   . Osteomyelitis ([place])    right great  . Osteomyelitis ([place])    RIGHT FOOT /TOE  . Pituitary mass ([place]) 2000   S/p transphenoidal excision 05/1999 ([facility])  . Vitamin D deficiency 2009     Past Surgical History:  Procedure Laterality Date  . AMPUTATION Right 12/20/2017   Procedure: RIGHT 1ST RAY AMPUTATION;  Surgeon: [name], MD;  Location: [place];  Service: Orthopedics;  Laterality: Right;  . AMPUTATION Right 03/23/2018   Procedure: RIGHT 2ND TOE AMPUTATION;  Surgeon: [name], MD;  Location: [place];  Service: Orthopedics;  Laterality: Right;  . BRAIN SURGERY  2000   pituatary gland removed .  Marland Kitchen CHOLECYSTECTOMY  1984  . Ectropion surgery Bilateral 2006  . EYE SURGERY  over last 6 yrs.     trabeculectomy...   . EYE SURGERY   cat ext ou  . JOINT REPLACEMENT  2011   knee left  . KNEE ARTHROSCOPY Right 02/06/2015   Procedure: RIGHT ARTHROSCOPY KNEE WITH LATERAL MENSICAL  DEBRIDEMENT;  Surgeon: Gaynelle Arabian, MD;  Location: [facility];  Service: Orthopedics;  Laterality: Right;  . Mohs procedure      for skin cancer on nose   . NEPHRECTOMY Right 1994   Renal cell  . REVERSE SHOULDER ARTHROPLASTY  12/15/2011   Procedure: REVERSE SHOULDER ARTHROPLASTY;  Surgeon: Marin Shutter, MD;  Location: [place];  Service: Orthopedics;  Laterality: Right;  right total reverse shoulder  . TONSILLECTOMY    . TOTAL KNEE ARTHROPLASTY Right 06/29/2015   Procedure: TOTAL KNEE ARTHROPLASTY;  Surgeon: Gaynelle Arabian, MD;  Location: [facility];  Service: Orthopedics;  Laterality: Right;  . Transsphenoidal excision pituitary tumor  05/1999   A.Tommi Rumps, M.D.([facility])    Allergies  Allergen Reactions  . Adhesive [Tape] Other (See Comments)    CAUSES SKIN TEARS, prefers paper tape   . Lyrica [Pregabalin] Swelling  . Percocet [Oxycodone-Acetaminophen] Other (See Comments)    UNSPECIFIED REACTION  Does not want to take  . Robaxin [Methocarbamol]     UNSPECIFIED REACTION   . Diovan [Valsartan] Rash    Outpatient Encounter Medications as of 07/17/2019  Medication Sig  . acetaminophen (TYLENOL) 500 MG tablet Take 500 mg by mouth daily as needed for fever.   . Cholecalciferol (VITAMIN D) 2000  units tablet Take 2,000 Units by mouth daily.  . Coenzyme Q10 300 MG CAPS Take 300 mg by mouth daily.   . Diclofenac Sodium 1.5 % SOLN Dispense 10 to 40 drops to affected area on skin as directed, twice per day  . finasteride (PROSCAR) 5 MG tablet Take 5 mg by mouth daily.   . Flaxseed, Linseed, (EQL FLAXSEED OIL) 1200 MG CAPS Take as directed once a day  . mirabegron ER (MYRBETRIQ) 25 MG TB24 tablet Take 25 mg by mouth every other day.  Marland Kitchen  Multiple Vitamins-Minerals (MULTIVITAMIN & MINERAL PO) Take 1 tablet by mouth daily.  . Omega-3 Fatty Acids (OMEGA-3 FISH OIL PO) Take 700 mg by mouth daily.  . pravastatin (PRAVACHOL) 80 MG tablet Take 80 mg by mouth every other day.   . predniSONE (DELTASONE) 5 MG tablet Take 2.5-5 mg by mouth See admin instructions. 5mg  in the morning and 2.5mg  in the evening  . SYNTHROID 88 MCG tablet Take 88 mcg by mouth daily before breakfast.   . testosterone cypionate (DEPOTESTOSTERONE CYPIONATE) 200 MG/ML injection Inject 30 mg (0.15 ml) intramuscularly weekly. Must discard vial after 90 days.  Marland Kitchen timolol (TIMOPTIC) 0.5 % ophthalmic solution Place 1 drop into the right eye daily.  . vitamin B-12 (CYANOCOBALAMIN) 500 MCG tablet Take 500 mcg by mouth daily.   Facility-Administered Encounter Medications as of 07/17/2019  Medication  . mupirocin cream (BACTROBAN) 2 %    Review of Systems:  Review of Systems  Constitutional: Negative for chills, fever and malaise/fatigue.  HENT: Positive for hearing loss. Negative for congestion and sore throat.   Eyes: Negative for blurred vision.  Respiratory: Negative for cough and shortness of breath.   Cardiovascular: Positive for leg swelling. Negative for chest pain and palpitations.  Gastrointestinal: Negative for abdominal pain and constipation.  Genitourinary: Negative for dysuria.  Musculoskeletal: Positive for back pain. Negative for falls.  Skin: Negative for itching and rash.  Neurological: Positive for sensory  change and weakness. Negative for dizziness and loss of consciousness.  Endo/Heme/Allergies: Bruises/bleeds easily.  Psychiatric/Behavioral: Positive for memory loss. Negative for depression. The patient is not nervous/anxious and does not have insomnia.        Mild cognitive impairment    Health Maintenance  Topic Date Due  . URINE MICROALBUMIN  08/04/2019 (Originally 01/22/1933)  . FOOT EXAM  09/28/2019 (Originally 01/22/1933)  . HEMOGLOBIN A1C  09/28/2019 (Originally 09/20/2018)  . OPHTHALMOLOGY EXAM  06/04/2020  . TETANUS/TDAP  06/08/2028  . INFLUENZA VACCINE  Completed  . PNA vac Low Risk Adult  Completed    Physical Exam: Vitals:   07/17/19 1354  BP: 134/82  Pulse: 76  Temp: 97.7 F (36.5 C)  TempSrc: Temporal  SpO2: 97%  Weight: 176 lb (79.8 kg)  Height: 6' (1.829 m)   Body mass index is 23.87 kg/m. Physical Exam Vitals signs reviewed.  Constitutional:      General: He is not in acute distress.    Appearance: Normal appearance. He is not ill-appearing or toxic-appearing.  HENT:     Head: Normocephalic and atraumatic.  Eyes:     Comments: glasses  Cardiovascular:     Rate and Rhythm: Normal rate and regular rhythm.     Pulses: Normal pulses.     Heart sounds: Normal heart sounds.  Pulmonary:     Effort: Pulmonary effort is normal.     Breath sounds: Normal breath sounds. No wheezing, rhonchi or rales.  Abdominal:     General: Bowel sounds are normal.  Musculoskeletal: Normal range of motion.     Right lower leg: Edema present.     Left lower leg: Edema present.     Comments: Using scooter to get around long distances; walker for short distances in apt and to meals  Skin:    General: Skin is warm and dry.     Capillary Refill: Capillary refill takes less than 2 seconds.     Comments: Left hand skin tear  Neurological:     General: No focal deficit present.  Mental Status: He is alert and oriented to person, place, and time.     Cranial Nerves: No  cranial nerve deficit.     Motor: Weakness present.     Gait: Gait abnormal.  Psychiatric:        Mood and Affect: Mood normal.     Comments: Gets very defensive when asked questions     Labs reviewed: Basic Metabolic Panel: Recent Labs    10/15/18  NA 140  K 4.0  BUN 24*  CREATININE 1.2   Liver Function Tests: Recent Labs    10/15/18  AST 13*  ALT 12  ALKPHOS 48   No results for input(s): LIPASE, AMYLASE in the last 8760 hours. No results for input(s): AMMONIA in the last 8760 hours. CBC: Recent Labs    10/15/18  WBC 9.0  HGB 12.7*  HCT 38*  PLT 243   Lipid Panel: No results for input(s): CHOL, HDL, LDLCALC, TRIG, CHOLHDL, LDLDIRECT in the last 8760 hours. Lab Results  Component Value Date   HGBA1C 6.5 (H) 03/20/2018    Assessment/Plan 1. Unsteady gait -persists, continue walker, power scooter and regular exercise regimen  2. Chronic left-sided low back pain with left-sided sciatica -continues with epidural injections -did not continue with gabapentin for neuropathic pain  3. Chronic venous insufficiency of lower extremity -ongoing, to be using compression hose ongoing  4. 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 Elyse Hsu, his endocrinologist--he checks his hormone levels, continues on testosterone, prednisone,   5. Adrenal insufficiency ([place]) -requires stress dose steroids when ill   Labs/tests ordered:  No new--gets at endocrine Next appt:  6 mos at [facility]--01/15/19 at 1:30pm  [name] [name] L. Cedric Denison, D.O. [facility] [address], [place] [address] Cell Phone (Mon-Fri 8am-5pm):  [phone] On Call:  [phone] & follow prompts after 5pm & weekends Office Phone:  [phone] Office Fax:  [phone]

## 2019-07-24 ENCOUNTER — Other Ambulatory Visit: Payer: Self-pay

## 2019-08-07 ENCOUNTER — Telehealth: Payer: Self-pay | Admitting: Physical Medicine and Rehabilitation

## 2019-08-07 DIAGNOSIS — D692 Other nonthrombocytopenic purpura: Secondary | ICD-10-CM | POA: Diagnosis not present

## 2019-08-07 DIAGNOSIS — L570 Actinic keratosis: Secondary | ICD-10-CM | POA: Diagnosis not present

## 2019-08-07 DIAGNOSIS — Z85828 Personal history of other malignant neoplasm of skin: Secondary | ICD-10-CM | POA: Diagnosis not present

## 2019-08-07 NOTE — Telephone Encounter (Signed)
So never has been his sciatic nerve, please tell him again, he needs repeat of last injection.

## 2019-08-07 NOTE — Telephone Encounter (Signed)
Scheduled for 12/21 at 1300 with driver.

## 2019-08-19 ENCOUNTER — Ambulatory Visit (INDEPENDENT_AMBULATORY_CARE_PROVIDER_SITE_OTHER): Payer: PPO | Admitting: Physical Medicine and Rehabilitation

## 2019-08-19 ENCOUNTER — Other Ambulatory Visit: Payer: Self-pay

## 2019-08-19 ENCOUNTER — Encounter: Payer: Self-pay | Admitting: Physical Medicine and Rehabilitation

## 2019-08-19 ENCOUNTER — Ambulatory Visit: Payer: Self-pay

## 2019-08-19 VITALS — BP 153/80 | HR 87

## 2019-08-19 DIAGNOSIS — M5416 Radiculopathy, lumbar region: Secondary | ICD-10-CM

## 2019-08-19 DIAGNOSIS — M48061 Spinal stenosis, lumbar region without neurogenic claudication: Secondary | ICD-10-CM | POA: Diagnosis not present

## 2019-08-19 DIAGNOSIS — M48062 Spinal stenosis, lumbar region with neurogenic claudication: Secondary | ICD-10-CM | POA: Diagnosis not present

## 2019-08-19 MED ORDER — BETAMETHASONE SOD PHOS & ACET 6 (3-3) MG/ML IJ SUSP
12.0000 mg | Freq: Once | INTRAMUSCULAR | Status: AC
  Administered 2019-08-19: 12 mg

## 2019-08-19 NOTE — Progress Notes (Signed)
.  Numeric Pain Rating Scale and Functional Assessment Average Pain 7   In the last MONTH (on 0-10 scale) has pain interfered with the following?  1. General activity like being  able to carry out your everyday physical activities such as walking, climbing stairs, carrying groceries, or moving a chair?  Rating(7)   +Driver, -BT, -Dye Allergies.   

## 2019-08-19 NOTE — Procedures (Signed)
Lumbosacral Transforaminal Epidural Steroid Injection - Infraneural Approach with Fluoroscopic Guidance  Patient: [name] [name].      Date of Birth: 02/11/1923 MRN: [id number] PCP: [name], DO      Visit Date: 08/19/2019   Universal Protocol:     Consent Given By: the patient  Position: PRONE   Additional Comments: Vital signs were monitored before and after the procedure. Patient was prepped and draped in the usual sterile fashion. The correct patient, procedure, and site was verified.   Injection Procedure Details:  Procedure Site One Meds Administered:  Meds ordered this encounter  Medications  . betamethasone acetate-betamethasone sodium phosphate (CELESTONE) injection 12 mg      Laterality: Left  Location/Site:  L5-S1  Needle size: 22 G  Needle type: Spinal  Needle Placement: Transforaminal  Findings:   -Comments: Excellent flow of contrast along the nerve and into the epidural space.  Procedure Details: After squaring off the end-plates of the desired vertebral level to get a true AP view, the C-arm was obliqued to the painful side so that the superior articulating process is positioned about 1/3 the length of the inferior endplate.  The needle was aimed toward the junction of the superior articular process and the transverse process of the inferior vertebrae. The needle's initial entry is in the lower third of the foramen through Kambin's triangle. The soft tissues overlying this target were infiltrated with 2-3 ml. of 1% Lidocaine without Epinephrine.  The spinal needle was then inserted and advanced toward the target using a "trajectory" view along the fluoroscope beam.  Under AP and lateral visualization, the needle was advanced so it did not puncture dura and did not traverse medially beyond the 6 o'clock position of the pedicle. Bi-planar projections were used to confirm position. Aspiration was confirmed to be negative for CSF and/or blood. A 1-2  ml. volume of Isovue-250 was injected and flow of contrast was noted at each level. Radiographs were obtained for documentation purposes.   After attaining the desired flow of contrast documented above, a 0.5 to 1.0 ml test dose of 0.25% Marcaine was injected into each respective transforaminal space.  The patient was observed for 90 seconds post injection.  After no sensory deficits were reported, and normal lower extremity motor function was noted,   the above injectate was administered so that equal amounts of the injectate were placed at each foramen (level) into the transforaminal epidural space.   Additional Comments:  The patient tolerated the procedure well Dressing: Band-Aid    Post-procedure details: Patient was observed during the procedure. Post-procedure instructions were reviewed.  Patient left the clinic in stable condition.

## 2019-08-19 NOTE — Progress Notes (Signed)
[name] [name]. - 85 y.o. male MRN [id number]  Date of birth: September 28, 1922  Office Visit Note: Visit Date: 08/19/2019 PCP: [name], DO Referred by: [name], DO  Subjective: Chief Complaint  Patient presents with  . Lower Back - Pain  . Left Thigh - Pain   HPI:  [name] [name]. is a 85 y.o. male who comes in today For planned repeat left L5 transforaminal epidural steroid injection.  Last injection in September helped out a great deal but the symptoms have returned.  He has pain in the left buttocks while standing and walking and some referral in the thigh.  This is still consistent with what we have been dealing with.  The patient has failed conservative care including home exercise, medications, time and activity modification.  This injection will be diagnostic and hopefully therapeutic.  Please see requesting physician notes for further details and justification.   ROS Otherwise per HPI.  Assessment & Plan: Visit Diagnoses:  1. Lumbar radiculopathy   2. Foraminal stenosis of lumbar region   3. Spinal stenosis of lumbar region with neurogenic claudication     Plan: No additional findings.   Meds & Orders:  Meds ordered this encounter  Medications  . betamethasone acetate-betamethasone sodium phosphate (CELESTONE) injection 12 mg    Orders Placed This Encounter  Procedures  . XR C-ARM NO REPORT  . Epidural Steroid injection    Follow-up: Return in about 3 months (around 11/17/2019) for Repeat.   Procedures: No procedures performed  Lumbosacral Transforaminal Epidural Steroid Injection - Infraneural Approach with Fluoroscopic Guidance  Patient: [name] [name].      Date of Birth: 1923/08/18 MRN: [id number] PCP: [name], DO      Visit Date: 08/19/2019   Universal Protocol:     Consent Given By: the patient  Position: PRONE   Additional Comments: Vital signs were monitored before and after the procedure. Patient was prepped and  draped in the usual sterile fashion. The correct patient, procedure, and site was verified.   Injection Procedure Details:  Procedure Site One Meds Administered:  Meds ordered this encounter  Medications  . betamethasone acetate-betamethasone sodium phosphate (CELESTONE) injection 12 mg      Laterality: Left  Location/Site:  L5-S1  Needle size: 22 G  Needle type: Spinal  Needle Placement: Transforaminal  Findings:   -Comments: Excellent flow of contrast along the nerve and into the epidural space.  Procedure Details: After squaring off the end-plates of the desired vertebral level to get a true AP view, the C-arm was obliqued to the painful side so that the superior articulating process is positioned about 1/3 the length of the inferior endplate.  The needle was aimed toward the junction of the superior articular process and the transverse process of the inferior vertebrae. The needle's initial entry is in the lower third of the foramen through Kambin's triangle. The soft tissues overlying this target were infiltrated with 2-3 ml. of 1% Lidocaine without Epinephrine.  The spinal needle was then inserted and advanced toward the target using a "trajectory" view along the fluoroscope beam.  Under AP and lateral visualization, the needle was advanced so it did not puncture dura and did not traverse medially beyond the 6 o'clock position of the pedicle. Bi-planar projections were used to confirm position. Aspiration was confirmed to be negative for CSF and/or blood. A 1-2 ml. volume of Isovue-250 was injected and flow of contrast was noted at each  level. Radiographs were obtained for documentation purposes.   After attaining the desired flow of contrast documented above, a 0.5 to 1.0 ml test dose of 0.25% Marcaine was injected into each respective transforaminal space.  The patient was observed for 90 seconds post injection.  After no sensory deficits were reported, and normal lower  extremity motor function was noted,   the above injectate was administered so that equal amounts of the injectate were placed at each foramen (level) into the transforaminal epidural space.   Additional Comments:  The patient tolerated the procedure well Dressing: Band-Aid    Post-procedure details: Patient was observed during the procedure. Post-procedure instructions were reviewed.  Patient left the clinic in stable condition.    Clinical History: Lumbar spine MRI dated 2018 is completed at [facility].  At L4-5 there is moderate multifactorial stenosis with foraminal narrowing and disc protrusion on the left.  At L5-S1 there is facet arthropathy which is pretty significant bilaterally with facet joint cyst on the left.     Objective:  VS:  HT:    WT:   BMI:     BP:(!) 153/80  HR:87bpm  TEMP: ( )  RESP:  Physical Exam  Ortho Exam Imaging: Epidural Steroid injection  Result Date: 11/18/2019 Magnus Sinning, MD     11/19/2019  5:35 AM Lumbosacral Transforaminal Epidural Steroid Injection - Sub-Pedicular Approach with Fluoroscopic Guidance Patient: [name] [name].     Date of Birth: 22-Oct-1922 MRN: [id number] PCP: [name], DO     Visit Date: 11/18/2019  Universal Protocol:   Date/Time: 11/18/2019 Consent Given By: the patient Position: PRONE Additional Comments: Vital signs were monitored before and after the procedure. Patient was prepped and draped in the usual sterile fashion. The correct patient, procedure, and site was verified. Injection Procedure Details: Procedure Site One Meds Administered: Meds ordered this encounter Medications . methylPREDNISolone acetate (DEPO-MEDROL) injection 40 mg Laterality: Right Location/Site: L4-L5 Needle size: 22 G Needle type: Spinal Needle Placement: Transforaminal Findings:   -Comments: Excellent flow of contrast along the nerve and into the epidural space. Procedure Details: After squaring off the end-plates to get a true AP  view, the C-arm was positioned so that an oblique view of the foramen as noted above was visualized. The target area is just inferior to the "nose of the scotty dog" or sub pedicular. The soft tissues overlying this structure were infiltrated with 2-3 ml. of 1% Lidocaine without Epinephrine. The spinal needle was inserted toward the target using a "trajectory" view along the fluoroscope beam.  Under AP and lateral visualization, the needle was advanced so it did not puncture dura and was located close the 6 O'Clock position of the pedical in AP tracterory. Biplanar projections were used to confirm position. Aspiration was confirmed to be negative for CSF and/or blood. A 1-2 ml. volume of Isovue-250 was injected and flow of contrast was noted at each level. Radiographs were obtained for documentation purposes. After attaining the desired flow of contrast documented above, a 0.5 to 1.0 ml test dose of 0.25% Marcaine was injected into each respective transforaminal space.  The patient was observed for 90 seconds post injection.  After no sensory deficits were reported, and normal lower extremity motor function was noted,   the above injectate was administered so that equal amounts of the injectate were placed at each foramen (level) into the transforaminal epidural space. Additional Comments: The patient tolerated the procedure well Dressing: 2 x 2 sterile gauze and Band-Aid  Post-procedure details: Patient was observed during the procedure. Post-procedure instructions were reviewed. Patient left the clinic in stable condition.   XR C-ARM NO REPORT  Result Date: 11/18/2019 Please see Notes tab for imaging impression.

## 2019-08-29 DIAGNOSIS — I10 Essential (primary) hypertension: Secondary | ICD-10-CM | POA: Diagnosis not present

## 2019-08-29 DIAGNOSIS — D510 Vitamin B12 deficiency anemia due to intrinsic factor deficiency: Secondary | ICD-10-CM | POA: Diagnosis not present

## 2019-08-29 DIAGNOSIS — E7800 Pure hypercholesterolemia, unspecified: Secondary | ICD-10-CM | POA: Diagnosis not present

## 2019-08-29 DIAGNOSIS — E230 Hypopituitarism: Secondary | ICD-10-CM | POA: Diagnosis not present

## 2019-08-29 LAB — LIPID PANEL
Cholesterol: 138 (ref 0–200)
HDL: 2 — AB (ref 35–70)
LDL Cholesterol: 61
Triglycerides: 72 (ref 40–160)

## 2019-08-29 LAB — CBC AND DIFFERENTIAL
HCT: 39 — AB (ref 41–53)
Hemoglobin: 12.9 — AB (ref 13.5–17.5)
Platelets: 233 (ref 150–399)
WBC: 11.3

## 2019-08-29 LAB — BASIC METABOLIC PANEL
BUN: 25 — AB (ref 4–21)
CO2: 33 — AB (ref 13–22)
Chloride: 101 (ref 99–108)
Creatinine: 1.2 (ref 0.6–1.3)
Glucose: 100
Potassium: 3.9 (ref 3.4–5.3)
Sodium: 139 (ref 137–147)

## 2019-08-29 LAB — CBC: RBC: 3.88 (ref 3.87–5.11)

## 2019-08-29 LAB — COMPREHENSIVE METABOLIC PANEL
Albumin: 3.6 (ref 3.5–5.0)
Calcium: 9.4 (ref 8.7–10.7)

## 2019-09-09 DIAGNOSIS — E7800 Pure hypercholesterolemia, unspecified: Secondary | ICD-10-CM | POA: Diagnosis not present

## 2019-09-09 DIAGNOSIS — I129 Hypertensive chronic kidney disease with stage 1 through stage 4 chronic kidney disease, or unspecified chronic kidney disease: Secondary | ICD-10-CM | POA: Diagnosis not present

## 2019-09-09 DIAGNOSIS — E230 Hypopituitarism: Secondary | ICD-10-CM | POA: Diagnosis not present

## 2019-09-09 DIAGNOSIS — D510 Vitamin B12 deficiency anemia due to intrinsic factor deficiency: Secondary | ICD-10-CM | POA: Diagnosis not present

## 2019-09-09 DIAGNOSIS — M7989 Other specified soft tissue disorders: Secondary | ICD-10-CM | POA: Diagnosis not present

## 2019-09-09 DIAGNOSIS — E559 Vitamin D deficiency, unspecified: Secondary | ICD-10-CM | POA: Diagnosis not present

## 2019-09-09 DIAGNOSIS — N1831 Chronic kidney disease, stage 3a: Secondary | ICD-10-CM | POA: Diagnosis not present

## 2019-09-12 ENCOUNTER — Encounter: Payer: Self-pay | Admitting: Internal Medicine

## 2019-11-11 DIAGNOSIS — H401133 Primary open-angle glaucoma, bilateral, severe stage: Secondary | ICD-10-CM | POA: Diagnosis not present

## 2019-11-18 ENCOUNTER — Ambulatory Visit (INDEPENDENT_AMBULATORY_CARE_PROVIDER_SITE_OTHER): Payer: PPO | Admitting: Physical Medicine and Rehabilitation

## 2019-11-18 ENCOUNTER — Encounter: Payer: Self-pay | Admitting: Physical Medicine and Rehabilitation

## 2019-11-18 ENCOUNTER — Ambulatory Visit: Payer: Self-pay

## 2019-11-18 ENCOUNTER — Other Ambulatory Visit: Payer: Self-pay

## 2019-11-18 VITALS — BP 159/77 | HR 82

## 2019-11-18 DIAGNOSIS — M5416 Radiculopathy, lumbar region: Secondary | ICD-10-CM | POA: Diagnosis not present

## 2019-11-18 DIAGNOSIS — E1142 Type 2 diabetes mellitus with diabetic polyneuropathy: Secondary | ICD-10-CM

## 2019-11-18 DIAGNOSIS — M48062 Spinal stenosis, lumbar region with neurogenic claudication: Secondary | ICD-10-CM

## 2019-11-18 MED ORDER — METHYLPREDNISOLONE ACETATE 80 MG/ML IJ SUSP
40.0000 mg | Freq: Once | INTRAMUSCULAR | Status: AC
  Administered 2019-11-18: 40 mg

## 2019-11-18 NOTE — Progress Notes (Signed)
 .  Numeric Pain Rating Scale and Functional Assessment Average Pain 9   In the last MONTH (on 0-10 scale) has pain interfered with the following?  1. General activity like being  able to carry out your everyday physical activities such as walking, climbing stairs, carrying groceries, or moving a chair?  Rating(9)   +Driver, -BT, -Dye Allergies.  

## 2019-11-19 NOTE — Progress Notes (Signed)
[name] - 86 y.o. male MRN [id number]  Date of birth: 06-15-1923  Office Visit Note: Visit Date: 11/18/2019 PCP: [name], DO Referred by: [name], DO  Subjective: Chief Complaint  Patient presents with  . Lower Back - Pain  . Right Leg - Pain  . Left Leg - Pain   HPI:  [name] [name]. is a 86 y.o. male who comes in today For what was planned repeat left L5 transforaminal epidural steroid injection.  His history is well known and documented in the chart.  He has foraminal narrowing at L5 which seemed to have caused this chronic severe left radicular leg pain.  He reports that the last injection did not help at all and he is pretty adamant when he words it that way.  Interestingly that was in December and is just now seeing [place] today.  Obviously there is probably some weight to see me at times but still that was 3 months ago.  In point of fact today he is having right-sided symptoms and not left-sided.  He says he gets a little bit on the left but is really is right side that is bothering.  Further investigation in the interview is that he is having basically pain from about the lower lateral right thigh to the upper calf but mainly lateral knee.  He has had bilateral knee replacements by Dr. Wynelle Link.  He has not had any recent follow-up or evaluation of his knees.  Speaking with him at length today we decided to complete right-sided L4 transforaminal injection at the level of central stenosis which is moderate.  He does have lateral recess narrowing here as well.  If he does not get much relief from that I have asked him to follow-up with Dr. Ninfa Linden in our office at his request.  He reports that it is easier to see Dr. Ninfa Linden then Dr. Wynelle Link.  Again point should be taken that his left side seems to be better.  ROS Otherwise per HPI.  Assessment & Plan: Visit Diagnoses:  1. Lumbar radiculopathy   2. Spinal stenosis of lumbar region with neurogenic claudication    3. Diabetic polyneuropathy associated with type 2 diabetes mellitus ([place])     Plan: No additional findings.   Meds & Orders:  Meds ordered this encounter  Medications  . methylPREDNISolone acetate (DEPO-MEDROL) injection 40 mg    Orders Placed This Encounter  Procedures  . XR C-ARM NO REPORT  . Epidural Steroid injection    Follow-up: Return if symptoms worsen or fail to improve.   Procedures: No procedures performed  Lumbosacral Transforaminal Epidural Steroid Injection - Sub-Pedicular Approach with Fluoroscopic Guidance  Patient: [name] [name].      Date of Birth: 17-Dec-1922 MRN: [id number] PCP: [name], DO      Visit Date: 11/18/2019   Universal Protocol:    Date/Time: 11/18/2019  Consent Given By: the patient  Position: PRONE  Additional Comments: Vital signs were monitored before and after the procedure. Patient was prepped and draped in the usual sterile fashion. The correct patient, procedure, and site was verified.   Injection Procedure Details:  Procedure Site One Meds Administered:  Meds ordered this encounter  Medications  . methylPREDNISolone acetate (DEPO-MEDROL) injection 40 mg    Laterality: Right  Location/Site:  L4-L5  Needle size: 22 G  Needle type: Spinal  Needle Placement: Transforaminal  Findings:    -Comments: Excellent flow of contrast along the  nerve and into the epidural space.  Procedure Details: After squaring off the end-plates to get a true AP view, the C-arm was positioned so that an oblique view of the foramen as noted above was visualized. The target area is just inferior to the "nose of the scotty dog" or sub pedicular. The soft tissues overlying this structure were infiltrated with 2-3 ml. of 1% Lidocaine without Epinephrine.  The spinal needle was inserted toward the target using a "trajectory" view along the fluoroscope beam.  Under AP and lateral visualization, the needle was advanced so it did not  puncture dura and was located close the 6 O'Clock position of the pedical in AP tracterory. Biplanar projections were used to confirm position. Aspiration was confirmed to be negative for CSF and/or blood. A 1-2 ml. volume of Isovue-250 was injected and flow of contrast was noted at each level. Radiographs were obtained for documentation purposes.   After attaining the desired flow of contrast documented above, a 0.5 to 1.0 ml test dose of 0.25% Marcaine was injected into each respective transforaminal space.  The patient was observed for 90 seconds post injection.  After no sensory deficits were reported, and normal lower extremity motor function was noted,   the above injectate was administered so that equal amounts of the injectate were placed at each foramen (level) into the transforaminal epidural space.   Additional Comments:  The patient tolerated the procedure well Dressing: 2 x 2 sterile gauze and Band-Aid    Post-procedure details: Patient was observed during the procedure. Post-procedure instructions were reviewed.  Patient left the clinic in stable condition.     Clinical History: Lumbar spine MRI dated 2018 is completed at [facility].  At L4-5 there is moderate multifactorial stenosis with foraminal narrowing and disc protrusion on the left.  At L5-S1 there is facet arthropathy which is pretty significant bilaterally with facet joint cyst on the left.     Objective:  VS:  HT:    WT:   BMI:     BP:(!) 159/77  HR:82bpm  TEMP: ( )  RESP:  Physical Exam  Ortho Exam Imaging: XR C-ARM NO REPORT  Result Date: 11/18/2019 Please see Notes tab for imaging impression.

## 2019-11-19 NOTE — Procedures (Signed)
Lumbosacral Transforaminal Epidural Steroid Injection - Sub-Pedicular Approach with Fluoroscopic Guidance  Patient: [name] [name].      Date of Birth: [birth year] MRN: [id number] PCP: [name], DO      Visit Date: 11/18/2019   Universal Protocol:    Date/Time: 11/18/2019  Consent Given By: the patient  Position: PRONE  Additional Comments: Vital signs were monitored before and after the procedure. Patient was prepped and draped in the usual sterile fashion. The correct patient, procedure, and site was verified.   Injection Procedure Details:  Procedure Site One Meds Administered:  Meds ordered this encounter  Medications  . methylPREDNISolone acetate (DEPO-MEDROL) injection 40 mg    Laterality: Right  Location/Site:  L4-L5  Needle size: 22 G  Needle type: Spinal  Needle Placement: Transforaminal  Findings:    -Comments: Excellent flow of contrast along the nerve and into the epidural space.  Procedure Details: After squaring off the end-plates to get a true AP view, the C-arm was positioned so that an oblique view of the foramen as noted above was visualized. The target area is just inferior to the "nose of the scotty dog" or sub pedicular. The soft tissues overlying this structure were infiltrated with 2-3 ml. of 1% Lidocaine without Epinephrine.  The spinal needle was inserted toward the target using a "trajectory" view along the fluoroscope beam.  Under AP and lateral visualization, the needle was advanced so it did not puncture dura and was located close the 6 O'Clock position of the pedical in AP tracterory. Biplanar projections were used to confirm position. Aspiration was confirmed to be negative for CSF and/or blood. A 1-2 ml. volume of Isovue-250 was injected and flow of contrast was noted at each level. Radiographs were obtained for documentation purposes.   After attaining the desired flow of contrast documented above, a 0.5 to 1.0 ml test dose of  0.25% Marcaine was injected into each respective transforaminal space.  The patient was observed for 90 seconds post injection.  After no sensory deficits were reported, and normal lower extremity motor function was noted,   the above injectate was administered so that equal amounts of the injectate were placed at each foramen (level) into the transforaminal epidural space.   Additional Comments:  The patient tolerated the procedure well Dressing: 2 x 2 sterile gauze and Band-Aid    Post-procedure details: Patient was observed during the procedure. Post-procedure instructions were reviewed.  Patient left the clinic in stable condition.

## 2019-12-13 ENCOUNTER — Telehealth: Payer: Self-pay | Admitting: Physical Medicine and Rehabilitation

## 2019-12-13 DIAGNOSIS — G8929 Other chronic pain: Secondary | ICD-10-CM

## 2019-12-13 NOTE — Telephone Encounter (Signed)
Done

## 2019-12-13 NOTE — Telephone Encounter (Signed)
Patient has requested a referral to Dr. Maureen Ralphs for his knee pain. I will put this in Epic.

## 2019-12-23 ENCOUNTER — Ambulatory Visit: Payer: PPO | Admitting: Orthopaedic Surgery

## 2019-12-26 DIAGNOSIS — Z96651 Presence of right artificial knee joint: Secondary | ICD-10-CM | POA: Diagnosis not present

## 2019-12-26 DIAGNOSIS — Z96653 Presence of artificial knee joint, bilateral: Secondary | ICD-10-CM | POA: Diagnosis not present

## 2019-12-26 DIAGNOSIS — Z471 Aftercare following joint replacement surgery: Secondary | ICD-10-CM | POA: Diagnosis not present

## 2020-01-06 DIAGNOSIS — Z9181 History of falling: Secondary | ICD-10-CM | POA: Diagnosis not present

## 2020-01-06 DIAGNOSIS — S76311S Strain of muscle, fascia and tendon of the posterior muscle group at thigh level, right thigh, sequela: Secondary | ICD-10-CM | POA: Diagnosis not present

## 2020-01-06 DIAGNOSIS — E1142 Type 2 diabetes mellitus with diabetic polyneuropathy: Secondary | ICD-10-CM | POA: Diagnosis not present

## 2020-01-06 DIAGNOSIS — R278 Other lack of coordination: Secondary | ICD-10-CM | POA: Diagnosis not present

## 2020-01-06 DIAGNOSIS — M25552 Pain in left hip: Secondary | ICD-10-CM | POA: Diagnosis not present

## 2020-01-06 DIAGNOSIS — M4807 Spinal stenosis, lumbosacral region: Secondary | ICD-10-CM | POA: Diagnosis not present

## 2020-01-06 DIAGNOSIS — S76312S Strain of muscle, fascia and tendon of the posterior muscle group at thigh level, left thigh, sequela: Secondary | ICD-10-CM | POA: Diagnosis not present

## 2020-01-06 DIAGNOSIS — R2689 Other abnormalities of gait and mobility: Secondary | ICD-10-CM | POA: Diagnosis not present

## 2020-01-06 DIAGNOSIS — M25551 Pain in right hip: Secondary | ICD-10-CM | POA: Diagnosis not present

## 2020-01-08 DIAGNOSIS — E559 Vitamin D deficiency, unspecified: Secondary | ICD-10-CM | POA: Diagnosis not present

## 2020-01-08 DIAGNOSIS — E230 Hypopituitarism: Secondary | ICD-10-CM | POA: Diagnosis not present

## 2020-01-08 DIAGNOSIS — D510 Vitamin B12 deficiency anemia due to intrinsic factor deficiency: Secondary | ICD-10-CM | POA: Diagnosis not present

## 2020-01-08 DIAGNOSIS — I129 Hypertensive chronic kidney disease with stage 1 through stage 4 chronic kidney disease, or unspecified chronic kidney disease: Secondary | ICD-10-CM | POA: Diagnosis not present

## 2020-01-08 DIAGNOSIS — N1831 Chronic kidney disease, stage 3a: Secondary | ICD-10-CM | POA: Diagnosis not present

## 2020-01-08 DIAGNOSIS — E7800 Pure hypercholesterolemia, unspecified: Secondary | ICD-10-CM | POA: Diagnosis not present

## 2020-01-10 DIAGNOSIS — S76311S Strain of muscle, fascia and tendon of the posterior muscle group at thigh level, right thigh, sequela: Secondary | ICD-10-CM | POA: Diagnosis not present

## 2020-01-10 DIAGNOSIS — E1142 Type 2 diabetes mellitus with diabetic polyneuropathy: Secondary | ICD-10-CM | POA: Diagnosis not present

## 2020-01-10 DIAGNOSIS — M25551 Pain in right hip: Secondary | ICD-10-CM | POA: Diagnosis not present

## 2020-01-10 DIAGNOSIS — M4807 Spinal stenosis, lumbosacral region: Secondary | ICD-10-CM | POA: Diagnosis not present

## 2020-01-10 DIAGNOSIS — S76312S Strain of muscle, fascia and tendon of the posterior muscle group at thigh level, left thigh, sequela: Secondary | ICD-10-CM | POA: Diagnosis not present

## 2020-01-10 DIAGNOSIS — R278 Other lack of coordination: Secondary | ICD-10-CM | POA: Diagnosis not present

## 2020-01-10 DIAGNOSIS — M25552 Pain in left hip: Secondary | ICD-10-CM | POA: Diagnosis not present

## 2020-01-10 DIAGNOSIS — Z9181 History of falling: Secondary | ICD-10-CM | POA: Diagnosis not present

## 2020-01-10 DIAGNOSIS — R2689 Other abnormalities of gait and mobility: Secondary | ICD-10-CM | POA: Diagnosis not present

## 2020-01-14 DIAGNOSIS — E1142 Type 2 diabetes mellitus with diabetic polyneuropathy: Secondary | ICD-10-CM | POA: Diagnosis not present

## 2020-01-14 DIAGNOSIS — Z9181 History of falling: Secondary | ICD-10-CM | POA: Diagnosis not present

## 2020-01-14 DIAGNOSIS — M4807 Spinal stenosis, lumbosacral region: Secondary | ICD-10-CM | POA: Diagnosis not present

## 2020-01-14 DIAGNOSIS — M25551 Pain in right hip: Secondary | ICD-10-CM | POA: Diagnosis not present

## 2020-01-14 DIAGNOSIS — R2689 Other abnormalities of gait and mobility: Secondary | ICD-10-CM | POA: Diagnosis not present

## 2020-01-14 DIAGNOSIS — R278 Other lack of coordination: Secondary | ICD-10-CM | POA: Diagnosis not present

## 2020-01-14 DIAGNOSIS — M25552 Pain in left hip: Secondary | ICD-10-CM | POA: Diagnosis not present

## 2020-01-14 DIAGNOSIS — S76311S Strain of muscle, fascia and tendon of the posterior muscle group at thigh level, right thigh, sequela: Secondary | ICD-10-CM | POA: Diagnosis not present

## 2020-01-14 DIAGNOSIS — S76312S Strain of muscle, fascia and tendon of the posterior muscle group at thigh level, left thigh, sequela: Secondary | ICD-10-CM | POA: Diagnosis not present

## 2020-01-15 ENCOUNTER — Encounter: Payer: Self-pay | Admitting: Internal Medicine

## 2020-01-15 ENCOUNTER — Encounter: Payer: PPO | Admitting: Internal Medicine

## 2020-01-17 DIAGNOSIS — M25551 Pain in right hip: Secondary | ICD-10-CM | POA: Diagnosis not present

## 2020-01-17 DIAGNOSIS — R278 Other lack of coordination: Secondary | ICD-10-CM | POA: Diagnosis not present

## 2020-01-17 DIAGNOSIS — S76311S Strain of muscle, fascia and tendon of the posterior muscle group at thigh level, right thigh, sequela: Secondary | ICD-10-CM | POA: Diagnosis not present

## 2020-01-17 DIAGNOSIS — E1142 Type 2 diabetes mellitus with diabetic polyneuropathy: Secondary | ICD-10-CM | POA: Diagnosis not present

## 2020-01-17 DIAGNOSIS — S76312S Strain of muscle, fascia and tendon of the posterior muscle group at thigh level, left thigh, sequela: Secondary | ICD-10-CM | POA: Diagnosis not present

## 2020-01-17 DIAGNOSIS — Z9181 History of falling: Secondary | ICD-10-CM | POA: Diagnosis not present

## 2020-01-17 DIAGNOSIS — R2689 Other abnormalities of gait and mobility: Secondary | ICD-10-CM | POA: Diagnosis not present

## 2020-01-17 DIAGNOSIS — M4807 Spinal stenosis, lumbosacral region: Secondary | ICD-10-CM | POA: Diagnosis not present

## 2020-01-17 DIAGNOSIS — M25552 Pain in left hip: Secondary | ICD-10-CM | POA: Diagnosis not present

## 2020-01-21 DIAGNOSIS — S76312S Strain of muscle, fascia and tendon of the posterior muscle group at thigh level, left thigh, sequela: Secondary | ICD-10-CM | POA: Diagnosis not present

## 2020-01-21 DIAGNOSIS — M25552 Pain in left hip: Secondary | ICD-10-CM | POA: Diagnosis not present

## 2020-01-21 DIAGNOSIS — R278 Other lack of coordination: Secondary | ICD-10-CM | POA: Diagnosis not present

## 2020-01-21 DIAGNOSIS — E1142 Type 2 diabetes mellitus with diabetic polyneuropathy: Secondary | ICD-10-CM | POA: Diagnosis not present

## 2020-01-21 DIAGNOSIS — S76311S Strain of muscle, fascia and tendon of the posterior muscle group at thigh level, right thigh, sequela: Secondary | ICD-10-CM | POA: Diagnosis not present

## 2020-01-21 DIAGNOSIS — M4807 Spinal stenosis, lumbosacral region: Secondary | ICD-10-CM | POA: Diagnosis not present

## 2020-01-21 DIAGNOSIS — M25551 Pain in right hip: Secondary | ICD-10-CM | POA: Diagnosis not present

## 2020-01-21 DIAGNOSIS — Z9181 History of falling: Secondary | ICD-10-CM | POA: Diagnosis not present

## 2020-01-21 DIAGNOSIS — R2689 Other abnormalities of gait and mobility: Secondary | ICD-10-CM | POA: Diagnosis not present

## 2020-01-23 DIAGNOSIS — E1142 Type 2 diabetes mellitus with diabetic polyneuropathy: Secondary | ICD-10-CM | POA: Diagnosis not present

## 2020-01-23 DIAGNOSIS — M25551 Pain in right hip: Secondary | ICD-10-CM | POA: Diagnosis not present

## 2020-01-23 DIAGNOSIS — S76311S Strain of muscle, fascia and tendon of the posterior muscle group at thigh level, right thigh, sequela: Secondary | ICD-10-CM | POA: Diagnosis not present

## 2020-01-23 DIAGNOSIS — S76312S Strain of muscle, fascia and tendon of the posterior muscle group at thigh level, left thigh, sequela: Secondary | ICD-10-CM | POA: Diagnosis not present

## 2020-01-23 DIAGNOSIS — R2689 Other abnormalities of gait and mobility: Secondary | ICD-10-CM | POA: Diagnosis not present

## 2020-01-23 DIAGNOSIS — Z9181 History of falling: Secondary | ICD-10-CM | POA: Diagnosis not present

## 2020-01-23 DIAGNOSIS — M4807 Spinal stenosis, lumbosacral region: Secondary | ICD-10-CM | POA: Diagnosis not present

## 2020-01-23 DIAGNOSIS — R278 Other lack of coordination: Secondary | ICD-10-CM | POA: Diagnosis not present

## 2020-01-23 DIAGNOSIS — M25552 Pain in left hip: Secondary | ICD-10-CM | POA: Diagnosis not present

## 2020-01-28 DIAGNOSIS — R2689 Other abnormalities of gait and mobility: Secondary | ICD-10-CM | POA: Diagnosis not present

## 2020-01-28 DIAGNOSIS — S76312S Strain of muscle, fascia and tendon of the posterior muscle group at thigh level, left thigh, sequela: Secondary | ICD-10-CM | POA: Diagnosis not present

## 2020-01-28 DIAGNOSIS — Z9181 History of falling: Secondary | ICD-10-CM | POA: Diagnosis not present

## 2020-01-28 DIAGNOSIS — E1142 Type 2 diabetes mellitus with diabetic polyneuropathy: Secondary | ICD-10-CM | POA: Diagnosis not present

## 2020-01-28 DIAGNOSIS — S76311S Strain of muscle, fascia and tendon of the posterior muscle group at thigh level, right thigh, sequela: Secondary | ICD-10-CM | POA: Diagnosis not present

## 2020-01-28 DIAGNOSIS — M25551 Pain in right hip: Secondary | ICD-10-CM | POA: Diagnosis not present

## 2020-01-28 DIAGNOSIS — M25552 Pain in left hip: Secondary | ICD-10-CM | POA: Diagnosis not present

## 2020-01-28 DIAGNOSIS — R278 Other lack of coordination: Secondary | ICD-10-CM | POA: Diagnosis not present

## 2020-01-28 DIAGNOSIS — M4807 Spinal stenosis, lumbosacral region: Secondary | ICD-10-CM | POA: Diagnosis not present

## 2020-02-03 ENCOUNTER — Encounter (HOSPITAL_BASED_OUTPATIENT_CLINIC_OR_DEPARTMENT_OTHER): Payer: PPO | Admitting: Internal Medicine

## 2020-02-10 ENCOUNTER — Non-Acute Institutional Stay (SKILLED_NURSING_FACILITY): Payer: PPO | Admitting: Adult Health

## 2020-02-10 ENCOUNTER — Encounter: Payer: Self-pay | Admitting: Adult Health

## 2020-02-10 DIAGNOSIS — L03116 Cellulitis of left lower limb: Secondary | ICD-10-CM | POA: Diagnosis not present

## 2020-02-10 DIAGNOSIS — E2740 Unspecified adrenocortical insufficiency: Secondary | ICD-10-CM | POA: Diagnosis not present

## 2020-02-10 DIAGNOSIS — L089 Local infection of the skin and subcutaneous tissue, unspecified: Secondary | ICD-10-CM

## 2020-02-10 DIAGNOSIS — I10 Essential (primary) hypertension: Secondary | ICD-10-CM | POA: Diagnosis not present

## 2020-02-10 DIAGNOSIS — H1033 Unspecified acute conjunctivitis, bilateral: Secondary | ICD-10-CM

## 2020-02-10 DIAGNOSIS — E893 Postprocedural hypopituitarism: Secondary | ICD-10-CM

## 2020-02-10 DIAGNOSIS — R531 Weakness: Secondary | ICD-10-CM | POA: Diagnosis not present

## 2020-02-10 DIAGNOSIS — T148XXA Other injury of unspecified body region, initial encounter: Secondary | ICD-10-CM | POA: Diagnosis not present

## 2020-02-10 NOTE — Progress Notes (Addendum)
Location:  Occupational psychologist of Service:  SNF (31) Provider:   Cindi Carbon, ANP [place] [phone]   [name], DO  Patient Care Team: [name], DO as PCP - General (Geriatric Medicine) Community, [name], [name], MD as Referring Physician (Endocrinology) [name], MD as Consulting Physician (Orthopedic Surgery) Bond, Tracie Harrier, MD as Referring Physician (Ophthalmology) Magnus Sinning, MD as Consulting Physician (Physical Medicine and Rehabilitation)  Extended Emergency Contact Information Primary Emergency Contact: Krizan,[name] Address: [address]          [place], [place] [address] [name] of [place] Phone: [phone] Mobile Phone: [phone] Relation: Son Secondary Emergency Contact: [name], [place] [address] [name] of [place] Phone: [phone] Mobile Phone: [phone] Relation: Friend  Code Status:  DNR Goals of care: Advanced Directive information Advanced Directives 07/17/2019  Does Patient Have a Medical Advance Directive? Yes  Type of Paramedic of [place];Out of facility DNR (pink MOST or yellow form)  Does patient want to make changes to medical advance directive? No - Patient declined  Copy of [place] in Chart? Yes - validated most recent copy scanned in chart (See row information)  Would patient like information on creating a medical advance directive? -  Pre-existing out of facility DNR order (yellow form or pink MOST form) Yellow form placed in chart (order not valid for inpatient use);Pink MOST form placed in chart (order not valid for inpatient use)     Chief Complaint  Patient presents with  . Acute Visit    cellulitis    HPI:  Pt is a 86 y.o. male seen today for an acute visit for cellulitis. Mr. 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 He was admitted on 02/07/20 to [facility] [place] due to increased weakness and LLE cellulitis. He has two wounds to the LLE, one to the heel area and a skin tear to the left posterior calf that have been treated by the wound care nurse here at [facility]. In review of epic he was given zithromax for the infected skin tear in May by Dr. Elyse Hsu.  On 6/13 [place] oncall notified of increased leg redness and swelling on the left. Doxycycline ordered for 10 days. He has received two doses. No fever or change in appetite. Needed a stand up lift today for transfers due to weakness. He reports his glaucoma is controlled but vision is poor. He has redness and purulent drainage in both eyes with irritation. No eye pain is reported. He denies any difficulty voiding or having bowel movements. He also reports chronic pain in the lumbar area and is followed by Dr. Ernestina Patches for epidural injections.   Past Medical History:  Diagnosis Date  . Allergic rhinitis    Prior allergy shots 20 years  . Anemia   . Anemia, B12 deficiency 2000  . Arthritis    Status post left total replacement 8 2011  . BPH (benign prostatic hyperplasia) 2013  . Cancer ([place])    renal cell ca and skin cancer   . Colitis 2014  . Difficult intubation 1994   surgery had to be  stopped due to injury to "throat"  .  Difficult intubation 1994   no trouble since.  Required nasotracheal intubation  '02 [facility] / ANESTHESIA RECORD FROM 2013 AND 2016 IN EPIC  . GERD (gastroesophageal reflux disease)   . Glaucoma 2007   Status post left trabeculectomy 2007  . History of colon polyps    Colonoscopy 2001  . History of kidney stones   . History of renal cell carcinoma 1994   Status post right nephrectomy  . Hyperlipidemia 2003    . Hypertension   . 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 Hypothyroidism (acquired) 2000  . Nocturia   . Osteomyelitis ([place])    right great  . Osteomyelitis ([place])    RIGHT FOOT /TOE  . Pituitary mass ([place]) 2000   S/p transphenoidal excision 05/1999 ([facility])  . Vitamin D deficiency 2009   Past Surgical History:  Procedure Laterality Date  . AMPUTATION Right 12/20/2017   Procedure: RIGHT 1ST RAY AMPUTATION;  Surgeon: [name], MD;  Location: [employer];  Service: Orthopedics;  Laterality: Right;  . AMPUTATION Right 03/23/2018   Procedure: RIGHT 2ND TOE AMPUTATION;  Surgeon: [name], MD;  Location: [facility];  Service: Orthopedics;  Laterality: Right;  . BRAIN SURGERY  2000   pituatary gland removed .  Marland Kitchen CHOLECYSTECTOMY  1984  . Ectropion surgery Bilateral 2006  . EYE SURGERY  over last 6 yrs.     trabeculectomy...   . EYE SURGERY   cat ext ou  . JOINT REPLACEMENT  2011   knee left  . KNEE ARTHROSCOPY Right 02/06/2015   Procedure: RIGHT ARTHROSCOPY KNEE WITH LATERAL MENSICAL  DEBRIDEMENT;  Surgeon: Gaynelle Arabian, MD;  Location: [facility];  Service: Orthopedics;  Laterality: Right;  . Mohs procedure      for skin cancer on nose   . NEPHRECTOMY Right 1994   Renal cell  . REVERSE SHOULDER ARTHROPLASTY  12/15/2011   Procedure: REVERSE SHOULDER ARTHROPLASTY;  Surgeon: Marin Shutter, MD;  Location: [place];  Service: Orthopedics;  Laterality: Right;  right total reverse shoulder  . TONSILLECTOMY    . TOTAL KNEE ARTHROPLASTY Right 06/29/2015   Procedure: TOTAL KNEE ARTHROPLASTY;  Surgeon: Gaynelle Arabian, MD;  Location: [facility];  Service: Orthopedics;  Laterality: Right;  . Transsphenoidal excision pituitary tumor  05/1999   A.Tommi Rumps, M.D.([facility])    Allergies  Allergen Reactions  . Adhesive [Tape] Other (See Comments)    CAUSES SKIN TEARS, prefers paper tape   . Lyrica [Pregabalin] Swelling  . Percocet [Oxycodone-Acetaminophen]  Other (See Comments)    UNSPECIFIED REACTION  Does not want to take  . Robaxin [Methocarbamol]     UNSPECIFIED REACTION   . Diovan [Valsartan] Rash    Outpatient Encounter Medications as of 02/10/2020  Medication Sig  . doxycycline (DORYX) 100 MG EC tablet Take 100 mg by mouth 2 (two) times daily.  . predniSONE (DELTASONE) 5 MG tablet Take 2.5-5 mg by mouth See admin instructions. 5mg  in the morning and 2.5mg  in the evening  . acetaminophen (TYLENOL) 500 MG tablet Take 500 mg by mouth daily as needed for fever.   . Cholecalciferol (VITAMIN D) 2000 units tablet Take 2,000 Units by mouth daily.  . Coenzyme Q10 300 MG CAPS Take 300 mg by mouth daily.   . Diclofenac Sodium 1.5 % SOLN Dispense 10 to 40 drops to affected area on skin as directed, twice per day  . finasteride (PROSCAR) 5 MG tablet Take 5 mg by mouth  daily.   . Flaxseed, Linseed, (EQL FLAXSEED OIL) 1200 MG CAPS Take as directed once a day  . mirabegron ER (MYRBETRIQ) 25 MG TB24 tablet Take 25 mg by mouth every other day.  . Multiple Vitamins-Minerals (MULTIVITAMIN & MINERAL PO) Take 1 tablet by mouth daily.  . Omega-3 Fatty Acids (OMEGA-3 FISH OIL PO) Take 700 mg by mouth daily.  . pravastatin (PRAVACHOL) 80 MG tablet Take 80 mg by mouth daily.  Marland Kitchen SYNTHROID 88 MCG tablet Take 88 mcg by mouth daily before breakfast.   . testosterone cypionate (DEPOTESTOSTERONE CYPIONATE) 200 MG/ML injection Inject 30 mg (0.15 ml) intramuscularly weekly. Must discard vial after 90 days.  Marland Kitchen timolol (TIMOPTIC) 0.5 % ophthalmic solution Place 1 drop into the right eye daily.  . vitamin B-12 (CYANOCOBALAMIN) 500 MCG tablet Take 500 mcg by mouth daily.   Facility-Administered Encounter Medications as of 02/10/2020  Medication  . mupirocin cream (BACTROBAN) 2 %    Review of Systems  Constitutional: Positive for activity change. Negative for appetite change, chills, diaphoresis, fatigue, fever and unexpected weight change.  HENT: Negative for  congestion.   Respiratory: Negative for cough, shortness of breath, wheezing and stridor.   Cardiovascular: Positive for leg swelling. Negative for chest pain and palpitations.  Gastrointestinal: Negative for abdominal distention, abdominal pain, constipation and diarrhea.  Genitourinary: Negative for difficulty urinating and dysuria.  Musculoskeletal: Positive for back pain and gait problem. Negative for arthralgias, joint swelling and myalgias.  Skin: Positive for color change and wound.  Neurological: Positive for weakness. Negative for dizziness, seizures, syncope, facial asymmetry, speech difficulty and headaches.  Hematological: Negative for adenopathy. Does not bruise/bleed easily.  Psychiatric/Behavioral: Negative for agitation, behavioral problems and confusion.       Short term memory loss    Immunization History  Administered Date(s) Administered  . Fluad Quad(high Dose 65+) 04/30/2019  . Influenza, High Dose Seasonal PF 04/30/2017, 06/08/2018  . Influenza, Quadrivalent, Recombinant, Inj, Pf 05/19/2016  . Pneumococcal Conjugate-13 06/04/2015  . Pneumococcal Polysaccharide-23 06/29/2010  . Tdap 10/28/1998, 06/08/2018  . Zoster 10/27/2005  . Zoster Recombinat (Shingrix) 02/04/2018, 07/02/2018   Pertinent  Health Maintenance Due  Topic Date Due  . URINE MICROALBUMIN  Never done  . HEMOGLOBIN A1C  09/20/2018  . INFLUENZA VACCINE  03/29/2020  . OPHTHALMOLOGY EXAM  06/04/2020  . FOOT EXAM  02/09/2021  . PNA vac Low Risk Adult  Completed   Fall Risk  07/24/2019 07/17/2019 07/05/2019 03/13/2019 11/14/2018  Falls in the past year? 0 0 0 0 1  Comment Emmi Telephone Survey: data to providers prior to load - - - -  Number falls in past yr: - 0 0 0 0  Comment - - - - -  Injury with Fall? - 0 0 0 1  Risk Factor Category  - - - - -  Risk for fall due to : - - - - -   Functional Status Survey:    Vitals:   02/10/20 1028  BP: (!) 169/80  Pulse: 82  Resp: 15  Temp: 97.9 F  (36.6 C)  SpO2: 96%   There is no height or weight on file to calculate BMI. Physical Exam Vitals and nursing note reviewed.  Constitutional:      General: He is not in acute distress.    Appearance: He is not diaphoretic.  HENT:     Head: Normocephalic and atraumatic.  Neck:     Thyroid: No thyromegaly.     Vascular: No JVD.  Trachea: No tracheal deviation.  Cardiovascular:     Rate and Rhythm: Normal rate and regular rhythm.     Pulses:          Dorsalis pedis pulses are 0 on the right side and 1+ on the left side.     Heart sounds: No murmur heard.   Pulmonary:     Effort: Pulmonary effort is normal. No respiratory distress.     Breath sounds: Normal breath sounds. No wheezing.  Abdominal:     General: Bowel sounds are normal. There is no distension.     Palpations: Abdomen is soft.     Tenderness: There is no abdominal tenderness.  Musculoskeletal:     Right lower leg: Edema (trace) present.     Left lower leg: Edema (+1) present.  Lymphadenopathy:     Cervical: No cervical adenopathy.  Skin:    General: Skin is warm and dry.     Findings: Erythema (LLE from foot to upper calf area with warmth but not weaping) present.     Comments: Left heel with small round punched out appearing wound with 50 % pink 50% pale white color. No drainage or tenderness noted. (pt has neuropathy).  Left upper posterior calf with skin tear 100% pink tissue, surrounding erythema and purulent drainage noted but no tenderness.   Neurological:     Mental Status: He is alert and oriented to person, place, and time.     Cranial Nerves: No cranial nerve deficit.  Psychiatric:        Mood and Affect: Mood normal.     Labs reviewed: Recent Labs    08/29/19 0000  NA 139  K 3.9  CL 101  CO2 33*  BUN 25*  CREATININE 1.2  CALCIUM 9.4   Recent Labs    08/29/19 0000  ALBUMIN 3.6   Recent Labs    08/29/19 0000  WBC 11.3  HGB 12.9*  HCT 39*  PLT 233   Lab Results  Component  Value Date   TSH <0.010 (L) 03/20/2018   Lab Results  Component Value Date   HGBA1C 6.5 (H) 03/20/2018   Lab Results  Component Value Date   CHOL 138 08/29/2019   HDL 2 (A) 08/29/2019   LDLCALC 61 08/29/2019   TRIG 72 08/29/2019    Significant Diagnostic Results in last 30 days:  No results found.  Assessment/Plan 1. Cellulitis of left lower extremity Erythema and non healing wounds to the LLE but pt not toxic appearing.  Continue Doxycycline to complete 10 day course Reviewed ABI 2019 to BLE which was WNL Recommend elevation of the LLE and wound care to monitor his wounds.   2. Acute bacterial conjunctivitis of both eyes He has purulent drainage, redness, and irritation to both eyes and has erythromycin ointment at home that his wife will bring to the facility to use 5x daily for 7 days.   3. Essential hypertension Slightly elevated today. Will have rehab nurse monitor manual bp and report of consistent elevation is noted.   4. Adrenal insufficiency (HCC) Continue Prednisone 5 mg in the am and 2.5 mg in the evening with food  5. 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 Elyse Hsu  6. Weakness Now requiring a stand up lift. Will check labs due to status change. It appears that he is needing more assistance at home, has some memory loss, lumbar radiculopathy, and visual impairment which contribute to his increase care needs at this time. He just finished  working with PT and does not feel he needs additional help at this time.   7. Infected skin tear Noted to posterior LLE Doxycycline ordered as above, dressing changes per wound care at [facility].     Family/ staff Communication: discussed with the resident and nurse  Labs/tests ordered:  CBC and BMP in am

## 2020-02-11 ENCOUNTER — Encounter: Payer: Self-pay | Admitting: Internal Medicine

## 2020-02-11 ENCOUNTER — Non-Acute Institutional Stay (SKILLED_NURSING_FACILITY): Payer: PPO | Admitting: Internal Medicine

## 2020-02-11 DIAGNOSIS — L03116 Cellulitis of left lower limb: Secondary | ICD-10-CM | POA: Diagnosis not present

## 2020-02-11 DIAGNOSIS — R2681 Unsteadiness on feet: Secondary | ICD-10-CM | POA: Diagnosis not present

## 2020-02-11 DIAGNOSIS — I872 Venous insufficiency (chronic) (peripheral): Secondary | ICD-10-CM | POA: Diagnosis not present

## 2020-02-11 DIAGNOSIS — I10 Essential (primary) hypertension: Secondary | ICD-10-CM

## 2020-02-11 DIAGNOSIS — E893 Postprocedural hypopituitarism: Secondary | ICD-10-CM

## 2020-02-11 DIAGNOSIS — G8929 Other chronic pain: Secondary | ICD-10-CM

## 2020-02-11 DIAGNOSIS — E2740 Unspecified adrenocortical insufficiency: Secondary | ICD-10-CM | POA: Diagnosis not present

## 2020-02-11 DIAGNOSIS — M5442 Lumbago with sciatica, left side: Secondary | ICD-10-CM

## 2020-02-11 LAB — CBC AND DIFFERENTIAL
HCT: 34 — AB (ref 41–53)
Hemoglobin: 11.1 — AB (ref 13.5–17.5)
Platelets: 275 (ref 150–399)
WBC: 10.5

## 2020-02-11 LAB — BASIC METABOLIC PANEL
BUN: 19 (ref 4–21)
CO2: 27 — AB (ref 13–22)
Creatinine: 0.9 (ref 0.6–1.3)
Glucose: 173
Sodium: 136 — AB (ref 137–147)

## 2020-02-11 LAB — COMPREHENSIVE METABOLIC PANEL
Calcium: 9.3 (ref 8.7–10.7)
GFR calc Af Amer: 79.52
GFR calc non Af Amer: 68.61

## 2020-02-11 LAB — CBC: RBC: 3.32 — AB (ref 3.87–5.11)

## 2020-02-11 NOTE — Progress Notes (Signed)
Provider:  Rexene Edison. Mariea Clonts, D.O., C.M.D. Location:  [facility] Room Number: 158 A Place of Service:  SNF (31)  PCP: [name], DO Patient Care Team: [name], DO as PCP - General (Geriatric Medicine) Community[name], [name], MD as Referring Physician (Endocrinology) [name], MD as Consulting Physician (Orthopedic Surgery) Bond, Tracie Harrier, MD as Referring Physician (Ophthalmology) Magnus Sinning, MD as Consulting Physician (Physical Medicine and Rehabilitation)  Extended Emergency Contact Information Primary Emergency Contact: Ferriss,[name] Address: [address]          [place], [place] [address] [name] of [place] Phone: [phone] Mobile Phone: [phone] Relation: Son Secondary Emergency Contact: [name], [place] [address] [name] of [place] Phone: [phone] Mobile Phone: [phone] Relation: Friend  Code Status: DNR Goals of Care: Advanced Directive information Advanced Directives 02/11/2020  Does Patient Have a Medical Advance Directive? Yes  Type of Paramedic of [place];Out of facility DNR (pink MOST or yellow form);Living will  Does patient want to make changes to medical advance directive? No - Patient declined  Copy of [place] in Chart? Yes - validated most recent copy scanned in chart (See row information)  Would patient like information on creating a medical advance directive? -  Pre-existing out of facility DNR order (yellow form or pink MOST form) Pink MOST/Yellow Form most recent copy in chart - Physician notified to receive inpatient order      Chief Complaint  Patient presents with  . New Admit To SNF    New Admit  for Rehab Transitional     HPI: Patient is a 86 y.o. [place] resident of [facility] with h/o panhypopituitarism, adrenal insufficiency on chronic prednisone, chronic  back pain with radiculopathy, overactive bladder, b12 deficiency and some cognitive impairment seen today for admission to [facility] rehab due to left lower extremity wound and cellulitis.  He's completing doxycycline 100mg  po bid for 10 days on 6/13.  His wound is the left posterior calf.  He is unsure how he got it.  He also c/o increased weakness and difficulty caring for himself even with Tootsie's assistance.  He also has pain in both thighs that we discussed seems to originate from his lumbar spinal stenosis with radiculopathy.  He's gotten epidurals with little to no results by his report.    Past Medical History:  Diagnosis Date  . Allergic rhinitis    Prior allergy shots 20 years  . Anemia   . Anemia, B12 deficiency 2000  . Arthritis    Status post left total replacement 8 2011  . BPH (benign prostatic hyperplasia) 2013  . Cancer ([place])    renal cell ca and skin cancer   . Colitis 2014  . Difficult intubation 1994   surgery had to be  stopped due to injury to "throat"  . Difficult intubation 1994   no trouble since.  Required nasotracheal intubation  '02 [facility] / ANESTHESIA RECORD FROM 2013 AND 2016 IN EPIC  . GERD (gastroesophageal reflux disease)   . Glaucoma 2007   Status post left trabeculectomy 2007  . History of colon polyps    Colonoscopy 2001  . History of kidney stones   . History of renal cell carcinoma 1994   Status post right nephrectomy  . Hyperlipidemia 2003  . Hypertension   . 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 Hypothyroidism (acquired) 2000  . Nocturia   . Osteomyelitis ([place])    right great  . Osteomyelitis ([place])    RIGHT FOOT /TOE  . Pituitary mass ([place]) 2000   S/p transphenoidal excision 05/1999 ([facility])  . Vitamin D deficiency 2009   Past Surgical History:  Procedure Laterality Date  . AMPUTATION Right 12/20/2017   Procedure: RIGHT 1ST RAY AMPUTATION;  Surgeon: [name], MD;  Location:  [facility];  Service: Orthopedics;  Laterality: Right;  . AMPUTATION Right 03/23/2018   Procedure: RIGHT 2ND TOE AMPUTATION;  Surgeon: [name], MD;  Location: [facility];  Service: Orthopedics;  Laterality: Right;  . BRAIN SURGERY  2000   pituatary gland removed .  Marland Kitchen CHOLECYSTECTOMY  1984  . Ectropion surgery Bilateral 2006  . EYE SURGERY  over last 6 yrs.     trabeculectomy...   . EYE SURGERY   cat ext ou  . JOINT REPLACEMENT  2011   knee left  . KNEE ARTHROSCOPY Right 02/06/2015   Procedure: RIGHT ARTHROSCOPY KNEE WITH LATERAL MENSICAL  DEBRIDEMENT;  Surgeon: Gaynelle Arabian, MD;  Location: [facility];  Service: Orthopedics;  Laterality: Right;  . Mohs procedure      for skin cancer on nose   . NEPHRECTOMY Right 1994   Renal cell  . REVERSE SHOULDER ARTHROPLASTY  12/15/2011   Procedure: REVERSE SHOULDER ARTHROPLASTY;  Surgeon: Marin Shutter, MD;  Location: [facility];  Service: Orthopedics;  Laterality: Right;  right total reverse shoulder  . TONSILLECTOMY    . TOTAL KNEE ARTHROPLASTY Right 06/29/2015   Procedure: TOTAL KNEE ARTHROPLASTY;  Surgeon: Gaynelle Arabian, MD;  Location: [facility];  Service: Orthopedics;  Laterality: Right;  . Transsphenoidal excision pituitary tumor  05/1999   A.Tommi Rumps, M.D.([facility])    Social History   Socioeconomic History  . Marital status: Widowed    Spouse name: Not on file  . Number of children: Not on file  . Years of education: Not on file  . Highest education level: Not on file  Occupational History  . Not on file  Tobacco Use  . Smoking status: Former Smoker    Types: Pipe    Quit date: 09/26/1988    Years since quitting: 31.4  . Smokeless tobacco: Never Used  . Tobacco comment: about 20 years  Vaping Use  . Vaping Use: Never used  Substance and Sexual Activity  . Alcohol use: Yes    Comment: 2-3 drinks per week  . Drug use: No  . Sexual activity: Not Currently  Other Topics Concern  . Not on file  Social History Narrative   Patient is Married  Software engineer). Retired Engineer, mining) Administrator, sports. Lives in apartment, Independent Living section at [facility] since 2000.  Spouse lives in skilled nursing section in same community   Smoking 1994 , moderate alcohol use: 2 drinks a night. Exercises regularly with walking and exercise classes. Continues to drive   Patient has Advanced planning documents: Living Will               Social Determinants of Health   Financial Resource Strain:   . Difficulty of Paying Living Expenses:   Food Insecurity:   . Worried About Charity fundraiser in the Last Year:   . Arboriculturist in the Last Year:   Transportation Needs:   . Film/video editor (Medical):   Marland Kitchen Lack of Transportation (Non-Medical):   Physical Activity:   .  Days of Exercise per Week:   . Minutes of Exercise per Session:   Stress:   . Feeling of Stress :   Social Connections:   . Frequency of Communication with Friends and Family:   . Frequency of Social Gatherings with Friends and Family:   . Attends Religious Services:   . Active Member of Clubs or Organizations:   . Attends Archivist Meetings:   Marland Kitchen Marital Status:     reports that he quit smoking about 31 years ago. His smoking use included pipe. He has never used smokeless tobacco. He reports current alcohol use. He reports that he does not use drugs.  Functional Status Survey:    Family History  Problem Relation Age of Onset  . Lung cancer Father   . Anesthesia problems Neg Hx   . Hypotension Neg Hx   . Malignant hyperthermia Neg Hx   . Pseudochol deficiency Neg Hx     Health Maintenance  Topic Date Due  . URINE MICROALBUMIN  Never done  . HEMOGLOBIN A1C  09/20/2018  . INFLUENZA VACCINE  03/29/2020  . OPHTHALMOLOGY EXAM  06/04/2020  . FOOT EXAM  02/09/2021  . TETANUS/TDAP  06/08/2028  . COVID-19 Vaccine  Completed  . PNA vac Low Risk Adult  Completed    Allergies  Allergen Reactions  . Adhesive [Tape]  Other (See Comments)    CAUSES SKIN TEARS, prefers paper tape   . Lyrica [Pregabalin] Swelling  . Percocet [Oxycodone-Acetaminophen] Other (See Comments)    UNSPECIFIED REACTION  Does not want to take  . Robaxin [Methocarbamol]     UNSPECIFIED REACTION   . Diovan [Valsartan] Rash    Outpatient Encounter Medications as of 02/11/2020  Medication Sig  . acetaminophen (TYLENOL) 500 MG tablet Take 500 mg by mouth daily as needed for fever.   . Cholecalciferol (VITAMIN D) 2000 units tablet Take 2,000 Units by mouth daily.  . Coenzyme Q10 300 MG CAPS Take 300 mg by mouth daily.   . Diclofenac Sodium 1.5 % SOLN Dispense 10 to 40 drops to affected area on skin as directed, twice per day  . doxycycline (DORYX) 100 MG EC tablet Take 100 mg by mouth 2 (two) times daily.  Marland Kitchen erythromycin ophthalmic ointment 1 application at bedtime.  . finasteride (PROSCAR) 5 MG tablet Take 5 mg by mouth daily.   . Flaxseed, Linseed, (EQL FLAXSEED OIL) 1200 MG CAPS Take as directed once a day  . mirabegron ER (MYRBETRIQ) 25 MG TB24 tablet Take 25 mg by mouth every other day.  . Multiple Vitamins-Minerals (MULTIVITAMIN & MINERAL PO) Take 1 tablet by mouth daily.  . Omega-3 Fatty Acids (OMEGA-3 FISH OIL PO) Take 700 mg by mouth daily.  . pravastatin (PRAVACHOL) 80 MG tablet Take 80 mg by mouth daily.  . predniSONE (DELTASONE) 5 MG tablet Take 2.5-5 mg by mouth See admin instructions. 5mg  in the morning and 2.5mg  in the evening  . SYNTHROID 88 MCG tablet Take 88 mcg by mouth daily before breakfast.   . testosterone cypionate (DEPOTESTOSTERONE CYPIONATE) 200 MG/ML injection Inject 30 mg (0.15 ml) intramuscularly weekly. Must discard vial after 90 days.  Marland Kitchen timolol (TIMOPTIC) 0.5 % ophthalmic solution Place 1 drop into the right eye daily.  . vitamin B-12 (CYANOCOBALAMIN) 500 MCG tablet Take 500 mcg by mouth daily.  . [DISCONTINUED] mupirocin cream (BACTROBAN) 2 %    No facility-administered encounter medications on file  as of 02/11/2020.    Review of Systems  Constitutional:  Positive for malaise/fatigue. Negative for chills and fever.  HENT: Positive for hearing loss. Negative for congestion and sore throat.   Eyes: Positive for blurred vision.       Glasses  Respiratory: Negative for cough and shortness of breath.   Cardiovascular: Positive for leg swelling. Negative for chest pain and palpitations.  Gastrointestinal: Negative for abdominal pain, blood in stool, constipation and melena.  Genitourinary: Positive for frequency and urgency. Negative for dysuria.  Musculoskeletal: Positive for back pain and myalgias. Negative for falls.  Skin: Positive for rash.  Neurological: Positive for tingling and sensory change. Negative for dizziness and loss of consciousness.  Endo/Heme/Allergies: Bruises/bleeds easily.  Psychiatric/Behavioral: Positive for memory loss. Negative for depression. The patient is not nervous/anxious and does not have insomnia.     Vitals:   02/11/20 1536  BP: (!) 161/75  Pulse: 79  Temp: 98.1 F (36.7 C)  Weight: 176 lb 12.8 oz (80.2 kg)  Height: 5\' 11"  (1.803 m)   Body mass index is 24.66 kg/m. Physical Exam Vitals reviewed.  Constitutional:      General: He is not in acute distress.    Appearance: Normal appearance. He is not ill-appearing or toxic-appearing.  HENT:     Head: Normocephalic and atraumatic.     Right Ear: External ear normal.     Left Ear: External ear normal.     Nose: No congestion.  Eyes:     Extraocular Movements: Extraocular movements intact.     Conjunctiva/sclera: Conjunctivae normal.     Pupils: Pupils are equal, round, and reactive to light.  Cardiovascular:     Rate and Rhythm: Normal rate and regular rhythm.  Pulmonary:     Effort: Pulmonary effort is normal.     Breath sounds: Normal breath sounds.  Abdominal:     General: Bowel sounds are normal.     Palpations: Abdomen is soft.  Musculoskeletal:        General: Normal range of  motion.     Right lower leg: No edema.     Left lower leg: Edema present.  Skin:    General: Skin is warm and dry.     Comments: Left lower leg erythema and warmth, dressing on posterior lower leg  Neurological:     General: No focal deficit present.     Mental Status: He is alert and oriented to person, place, and time.  Psychiatric:        Mood and Affect: Mood normal.        Behavior: Behavior normal.        Thought Content: Thought content normal.        Judgment: Judgment normal.     Labs reviewed: Basic Metabolic Panel: Recent Labs    08/29/19 0000 02/11/20 0000  NA 139 136*  K 3.9  --   CL 101  --   CO2 33* 27*  BUN 25* 19  CREATININE 1.2 0.9  CALCIUM 9.4 9.3   Liver Function Tests: Recent Labs    08/29/19 0000  ALBUMIN 3.6   No results for input(s): LIPASE, AMYLASE in the last 8760 hours. No results for input(s): AMMONIA in the last 8760 hours. CBC: Recent Labs    08/29/19 0000 02/11/20 0000  WBC 11.3 10.5  HGB 12.9* 11.1*  HCT 39* 34*  PLT 233 275   Cardiac Enzymes: No results for input(s): CKTOTAL, CKMB, CKMBINDEX, TROPONINI in the last 8760 hours. BNP: Invalid input(s): POCBNP Lab Results  Component Value Date  HGBA1C 6.5 (H) 03/20/2018   Lab Results  Component Value Date   TSH <0.010 (L) 03/20/2018   Lab Results  Component Value Date   VITAMINB12 1,321 06/12/2017   No results found for: FOLATE No results found for: IRON, TIBC, FERRITIN  Imaging and Procedures obtained prior to SNF admission: MR LUMBAR SPINE WO CONTRAST  Result Date: 11/16/2018 CLINICAL DATA:  Low back pain and left leg pain and weakness over the last 2 years. EXAM: MRI LUMBAR SPINE WITHOUT CONTRAST TECHNIQUE: Multiplanar, multisequence MR imaging of the lumbar spine was performed. No intravenous contrast was administered. COMPARISON:  CT abdomen 04/05/2016 FINDINGS: Segmentation:  5 lumbar type vertebral bodies. Alignment: 1 or 2 mm of anterolisthesis at L4-5. 3 or 4  mm degenerative anterolisthesis L5-S1. Vertebrae: Old minor superior endplate fracture at L2 which is healed without residual edema. Some discogenic edema within the anterior superior endplate S1. benign fatty change within the right side of the L4 vertebral body. Conus medullaris and cauda equina: Conus extends to the L1-2 level. Conus and cauda equina appear normal. Paraspinal and other soft tissues: Previous right nephrectomy. Left renal cyst, not completely evaluated. Disc levels: No significant finding at T11-12 or T12-L1. L1-2: Mild desiccation and bulging of the disc.  No stenosis. L2-3: Minimal desiccation and bulging of the disc.  No stenosis. L3-4: Minimal desiccation and bulging of the disc. Mild facet hypertrophy. No stenosis. L4-5: Disc degeneration with circumferential bulging. Bilateral facet and ligamentous hypertrophy. One or 2 mm of anterolisthesis. Bilateral lateral recess stenosis that could cause neural compression on either or both sides. The facet arthropathy could also be painful. L5-S1: Chronic bilateral facet arthropathy allowing 3-4 mm of anterolisthesis. Disc degeneration with circumferential bulging. Stenosis of the subarticular lateral recesses and of both neural foramina. Neural compression could occur on either side, particularly in the foraminal regions. The facet arthropathy could also be a cause of back pain or referred facet syndrome pain. IMPRESSION: Old healed superior endplate fracture at L2. L4-5: Stenosis of both lateral recesses that could cause neural compression on either or both sides, secondary to bilateral facet degeneration and hypertrophy, 1 or 2 mm of anterolisthesis and circumferential bulging of the disc. The facet arthropathy could also be a cause of back pain or referred facet syndrome pain. L5-S1: Stenosis of the subarticular lateral recesses and neural foramina left more than right, secondary to bilateral facet arthropathy allowing 3 or 4 mm of anterolisthesis  in combination with bulging of the disc. In particular, the stenosis of the foramen on the left could compress the left L5 nerve. The facet arthropathy could also be a cause of back pain or referred facet syndrome pain. Electronically Signed   By: Nelson Chimes M.D.   On: 11/16/2018 16:35    Assessment/Plan 1. Cellulitis of left lower extremity -complete course of doxycycline, continue dressing changes  2. Essential hypertension -bp reading at admission elevated, monitor  3. Adrenal insufficiency (HCC) -cont prednisone therapy, infection does not seem severe enough to warrant an increase in dosing this time  4. 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 Elyse Hsu outpatient  5. Unsteady gait -continue PT, OT here  6. Chronic left-sided low back pain with left-sided sciatica -did not tolerate gabapentin well and epidurals not helping him much anymore -ok to try a cbd compound  -does not do well on opioids  7. Chronic venous insufficiency of lower extremity -elevate feet at rest, when no longer needs dressings for left  leg, resume compression hose  Family/ staff Communication: discussed with wife, rehab nurse  Labs/tests ordered:  No new added  Orlander Norwood L. Locke Barrell, D.O. [facility] [address], [place] [address] Cell Phone (Mon-Fri 8am-5pm):  [phone] On Call:  [phone] & follow prompts after 5pm & weekends Office Phone:  [phone] Office Fax:  [phone]

## 2020-02-17 ENCOUNTER — Encounter: Payer: Self-pay | Admitting: Adult Health

## 2020-02-17 ENCOUNTER — Non-Acute Institutional Stay (SKILLED_NURSING_FACILITY): Payer: PPO | Admitting: Adult Health

## 2020-02-17 DIAGNOSIS — L03116 Cellulitis of left lower limb: Secondary | ICD-10-CM

## 2020-02-17 DIAGNOSIS — R531 Weakness: Secondary | ICD-10-CM

## 2020-02-17 DIAGNOSIS — I10 Essential (primary) hypertension: Secondary | ICD-10-CM | POA: Diagnosis not present

## 2020-02-17 DIAGNOSIS — M5416 Radiculopathy, lumbar region: Secondary | ICD-10-CM

## 2020-02-17 DIAGNOSIS — B370 Candidal stomatitis: Secondary | ICD-10-CM | POA: Diagnosis not present

## 2020-02-17 DIAGNOSIS — J0110 Acute frontal sinusitis, unspecified: Secondary | ICD-10-CM | POA: Diagnosis not present

## 2020-02-17 NOTE — Progress Notes (Signed)
Location:  Occupational psychologist of Service:  SNF (31) Provider:   Cindi Carbon, ANP [place] [phone]  [name], DO  Patient Care Team: [name], DO as PCP - General (Geriatric Medicine) Community, [name], [name], MD as Referring Physician (Endocrinology) [name], MD as Consulting Physician (Orthopedic Surgery) Bond, Tracie Harrier, MD as Referring Physician (Ophthalmology) Magnus Sinning, MD as Consulting Physician (Physical Medicine and Rehabilitation)  Extended Emergency Contact Information Primary Emergency Contact: Loose,[name] Address: [address]          [place], [place] [address] [name] of [place] Phone: [phone] Mobile Phone: [phone] Relation: Son Secondary Emergency Contact: [name], [place] [address] [name] of [place] Phone: [phone] Mobile Phone: [phone] Relation: Friend  Code Status:  DNR Goals of care: Advanced Directive information Advanced Directives 02/11/2020  Does Patient Have a Medical Advance Directive? Yes  Type of Paramedic of [place];Out of facility DNR (pink MOST or yellow form);Living will  Does patient want to make changes to medical advance directive? No - Patient declined  Copy of [place] in Chart? Yes - validated most recent copy scanned in chart (See row information)  Would patient like information on creating a medical advance directive? -  Pre-existing out of facility DNR order (yellow form or pink MOST form) Pink MOST/Yellow Form most recent copy in chart - Physician notified to receive inpatient order     Chief Complaint  Patient presents with  . Acute Visit    f/u celluitis, elevated bp, weakness     HPI:  Pt is a 86 y.o. male seen today for f/u regarding cellulitis, elevated bp, and weakness.   He was admitted for cellulitis of the  left lower extremity with wound to the heel and posterior calf. He has completed 9 days of Doxycycline with improvement noted in pain, redness, and swelling.   He has not progressed in functional status. PT has been ordered. His goal is to be able to transfer to his motorized chair to the bathroom and return home. Labs ordered last week to eval for underlying cause of the weakness were normal and this was felt to be due to the cellulitis but also a general decline on the part of the patient which has been progressive  He has chronic pain in his back and legs due to lumbar radiculopathy. He is taking diclofenac gel and requested CBD cream. He states this helps about 50% of the time. He does not want oral meds due to s/e.    He reports nasal congestion but no cough or fever. Wants nasal spray. He is vaccinated.   BP is elevated today 177/80, other numbers in the past few days are WNL  Past Medical History:  Diagnosis Date  . Allergic rhinitis    Prior allergy shots 20 years  . Anemia   . Anemia, B12 deficiency 2000  . Arthritis    Status post left total replacement 8 2011  . BPH (benign prostatic hyperplasia) 2013  . Cancer ([place])    renal cell ca and skin cancer   . Colitis 2014  . Difficult intubation 1994   surgery had to be  stopped due to injury to "throat"  . Difficult intubation 1994   no trouble since.  Required nasotracheal intubation  '02 [facility] / ANESTHESIA RECORD FROM 2013 AND 2016 IN EPIC  .  GERD (gastroesophageal reflux disease)   . Glaucoma 2007   Status post left trabeculectomy 2007  . History of colon polyps    Colonoscopy 2001  . History of kidney stones   . History of renal cell carcinoma 1994   Status post right nephrectomy  . Hyperlipidemia 2003  . Hypertension   . 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 Hypothyroidism (acquired) 2000  . Nocturia   . Osteomyelitis ([place])    right great  . Osteomyelitis ([place])     RIGHT FOOT /TOE  . Pituitary mass ([place]) 2000   S/p transphenoidal excision 05/1999 ([facility])  . Vitamin D deficiency 2009   Past Surgical History:  Procedure Laterality Date  . AMPUTATION Right 12/20/2017   Procedure: RIGHT 1ST RAY AMPUTATION;  Surgeon: [name], MD;  Location: [place];  Service: Orthopedics;  Laterality: Right;  . AMPUTATION Right 03/23/2018   Procedure: RIGHT 2ND TOE AMPUTATION;  Surgeon: [name], MD;  Location: [place];  Service: Orthopedics;  Laterality: Right;  . BRAIN SURGERY  2000   pituatary gland removed .  Marland Kitchen CHOLECYSTECTOMY  1984  . Ectropion surgery Bilateral 2006  . EYE SURGERY  over last 6 yrs.     trabeculectomy...   . EYE SURGERY   cat ext ou  . JOINT REPLACEMENT  2011   knee left  . KNEE ARTHROSCOPY Right 02/06/2015   Procedure: RIGHT ARTHROSCOPY KNEE WITH LATERAL MENSICAL  DEBRIDEMENT;  Surgeon: Gaynelle Arabian, MD;  Location: [facility];  Service: Orthopedics;  Laterality: Right;  . Mohs procedure      for skin cancer on nose   . NEPHRECTOMY Right 1994   Renal cell  . REVERSE SHOULDER ARTHROPLASTY  12/15/2011   Procedure: REVERSE SHOULDER ARTHROPLASTY;  Surgeon: Marin Shutter, MD;  Location: [facility];  Service: Orthopedics;  Laterality: Right;  right total reverse shoulder  . TONSILLECTOMY    . TOTAL KNEE ARTHROPLASTY Right 06/29/2015   Procedure: TOTAL KNEE ARTHROPLASTY;  Surgeon: Gaynelle Arabian, MD;  Location: [facility];  Service: Orthopedics;  Laterality: Right;  . Transsphenoidal excision pituitary tumor  05/1999   A.Tommi Rumps, M.D.([facility])    Allergies  Allergen Reactions  . Adhesive [Tape] Other (See Comments)    CAUSES SKIN TEARS, prefers paper tape   . Lyrica [Pregabalin] Swelling  . Percocet [Oxycodone-Acetaminophen] Other (See Comments)    UNSPECIFIED REACTION  Does not want to take  . Robaxin [Methocarbamol]     UNSPECIFIED REACTION   . Diovan [Valsartan] Rash    Outpatient Encounter Medications as of 02/17/2020  Medication Sig  .  clotrimazole (MYCELEX) 10 MG troche Take 10 mg by mouth in the morning, at noon, in the evening, and at bedtime. Oral thrush  . fluticasone (FLONASE) 50 MCG/ACT nasal spray Place 2 sprays into both nostrils daily.  . NON FORMULARY 1 application 4 (four) times daily as needed. CBD cream apply to affected area 4 x day as needed  . acetaminophen (TYLENOL) 500 MG tablet Take 500 mg by mouth daily as needed for fever.   . Cholecalciferol (VITAMIN D) 2000 units tablet Take 2,000 Units by mouth daily.  . Coenzyme Q10 300 MG CAPS Take 300 mg by mouth daily.   . Diclofenac Sodium 1.5 % SOLN Dispense 10 to 40 drops to affected area on skin as directed, twice per day  . doxycycline (DORYX) 100 MG EC tablet Take 100 mg by mouth 2 (two) times daily.  Marland Kitchen  erythromycin ophthalmic ointment 1 application at bedtime.  . finasteride (PROSCAR) 5 MG tablet Take 5 mg by mouth daily.   . Flaxseed, Linseed, (EQL FLAXSEED OIL) 1200 MG CAPS Take as directed once a day  . mirabegron ER (MYRBETRIQ) 25 MG TB24 tablet Take 25 mg by mouth every other day.  . Multiple Vitamins-Minerals (MULTIVITAMIN & MINERAL PO) Take 1 tablet by mouth daily.  . Omega-3 Fatty Acids (OMEGA-3 FISH OIL PO) Take 700 mg by mouth daily.  . pravastatin (PRAVACHOL) 80 MG tablet Take 80 mg by mouth daily.  . predniSONE (DELTASONE) 5 MG tablet Take 2.5-5 mg by mouth See admin instructions. 5mg  in the morning and 2.5mg  in the evening  . SYNTHROID 88 MCG tablet Take 88 mcg by mouth daily before breakfast.   . testosterone cypionate (DEPOTESTOSTERONE CYPIONATE) 200 MG/ML injection Inject 30 mg (0.15 ml) intramuscularly weekly. Must discard vial after 90 days.  Marland Kitchen timolol (TIMOPTIC) 0.5 % ophthalmic solution Place 1 drop into the right eye daily.  . vitamin B-12 (CYANOCOBALAMIN) 500 MCG tablet Take 500 mcg by mouth daily.  . [DISCONTINUED] mupirocin cream (BACTROBAN) 2 %    No facility-administered encounter medications on file as of 02/17/2020.    Review  of Systems  Constitutional: Positive for activity change. Negative for appetite change, chills, diaphoresis, fatigue, fever and unexpected weight change.  HENT: Positive for congestion and sinus pressure. Negative for ear discharge, ear pain, sinus pain, sore throat and trouble swallowing.   Respiratory: Negative for cough, shortness of breath, wheezing and stridor.   Cardiovascular: Positive for leg swelling. Negative for chest pain and palpitations.  Gastrointestinal: Negative for abdominal distention, abdominal pain, constipation and diarrhea.  Genitourinary: Negative for difficulty urinating and dysuria.  Musculoskeletal: Positive for arthralgias, back pain and gait problem. Negative for joint swelling and myalgias.  Neurological: Positive for weakness. Negative for dizziness, seizures, syncope, facial asymmetry, speech difficulty and headaches.  Hematological: Negative for adenopathy. Does not bruise/bleed easily.  Psychiatric/Behavioral: Negative for agitation, behavioral problems and confusion.    Immunization History  Administered Date(s) Administered  . Fluad Quad(high Dose 65+) 04/30/2019  . Influenza, High Dose Seasonal PF 04/30/2017, 06/08/2018  . Influenza, Quadrivalent, Recombinant, Inj, Pf 05/19/2016  . Moderna SARS-COVID-2 Vaccination 09/10/2019, 10/08/2019  . Pneumococcal Conjugate-13 06/04/2015  . Pneumococcal Polysaccharide-23 06/29/2010  . Tdap 10/28/1998, 06/08/2018  . Zoster 10/27/2005  . Zoster Recombinat (Shingrix) 02/04/2018, 07/02/2018   Pertinent  Health Maintenance Due  Topic Date Due  . URINE MICROALBUMIN  Never done  . HEMOGLOBIN A1C  09/20/2018  . INFLUENZA VACCINE  03/29/2020  . OPHTHALMOLOGY EXAM  06/04/2020  . FOOT EXAM  02/09/2021  . PNA vac Low Risk Adult  Completed   Fall Risk  07/24/2019 07/17/2019 07/05/2019 03/13/2019 11/14/2018  Falls in the past year? 0 0 0 0 1  Comment Emmi Telephone Survey: data to providers prior to load - - - -  Number  falls in past yr: - 0 0 0 0  Comment - - - - -  Injury with Fall? - 0 0 0 1  Risk Factor Category  - - - - -  Risk for fall due to : - - - - -   Functional Status Survey:    Vitals:   02/17/20 1409  BP: (!) 177/80  Pulse: 71  Resp: 18  Temp: (!) 97 F (36.1 C)  SpO2: 93%   There is no height or weight on file to calculate BMI. Physical Exam Vitals and  nursing note reviewed.  Constitutional:      General: He is not in acute distress.    Appearance: He is not diaphoretic.  HENT:     Head: Normocephalic and atraumatic.     Nose: Nose normal. No congestion.     Mouth/Throat:     Mouth: Mucous membranes are moist.     Comments: White patches to tongue and posterior pharynx Eyes:     Conjunctiva/sclera: Conjunctivae normal.     Pupils: Pupils are equal, round, and reactive to light.  Neck:     Thyroid: No thyromegaly.     Vascular: No JVD.     Trachea: No tracheal deviation.  Cardiovascular:     Rate and Rhythm: Normal rate and regular rhythm.     Heart sounds: No murmur heard.   Pulmonary:     Effort: Pulmonary effort is normal. No respiratory distress.     Breath sounds: Normal breath sounds. No wheezing.  Abdominal:     General: Bowel sounds are normal. There is no distension.     Palpations: Abdomen is soft.     Tenderness: There is no abdominal tenderness.  Musculoskeletal:     Right lower leg: No edema.     Left lower leg: No edema.  Lymphadenopathy:     Cervical: No cervical adenopathy.  Skin:    General: Skin is warm and dry.     Comments: Improved erythema and swelling to the LLE. Dressing to posterior calf and left heel in place.   Neurological:     Mental Status: He is alert and oriented to person, place, and time.     Cranial Nerves: No cranial nerve deficit.  Psychiatric:        Mood and Affect: Mood normal.     Labs reviewed: Recent Labs    08/29/19 0000 02/11/20 0000  NA 139 136*  K 3.9  --   CL 101  --   CO2 33* 27*  BUN 25* 19    CREATININE 1.2 0.9  CALCIUM 9.4 9.3   Recent Labs    08/29/19 0000  ALBUMIN 3.6   Recent Labs    08/29/19 0000 02/11/20 0000  WBC 11.3 10.5  HGB 12.9* 11.1*  HCT 39* 34*  PLT 233 275   Lab Results  Component Value Date   TSH <0.010 (L) 03/20/2018   Lab Results  Component Value Date   HGBA1C 6.5 (H) 03/20/2018   Lab Results  Component Value Date   CHOL 138 08/29/2019   HDL 2 (A) 08/29/2019   LDLCALC 61 08/29/2019   TRIG 72 08/29/2019    Significant Diagnostic Results in last 30 days:  No results found.  Assessment/Plan 1. Cellulitis of left lower extremity Improved on Day 9/10 of Doxycycline   2. Thrush Clotrimazole troche QID x 7 days   3. Essential hypertension Elevated today, will need manual checks qd x 7 days and report of consistently >150/90  4. Acute non-recurrent frontal sinusitis Flonase 2 sprays qd x 2 weeks   5. Weakness No improvement, will need PT and OT to progress towards a goal of returning home  6. Lumbar radiculopathy Improved but not resolved, followed by Dr. Ernestina Patches Continue Diclofenac application scheduled bid and as needed cbd cream. He has declined any further medication for pain at this time.     Family/ staff Communication: resident   Labs/tests ordered:  NA

## 2020-02-18 DIAGNOSIS — M62562 Muscle wasting and atrophy, not elsewhere classified, left lower leg: Secondary | ICD-10-CM | POA: Diagnosis not present

## 2020-02-18 DIAGNOSIS — E1142 Type 2 diabetes mellitus with diabetic polyneuropathy: Secondary | ICD-10-CM | POA: Diagnosis not present

## 2020-02-18 DIAGNOSIS — M5126 Other intervertebral disc displacement, lumbar region: Secondary | ICD-10-CM | POA: Diagnosis not present

## 2020-02-18 DIAGNOSIS — M62561 Muscle wasting and atrophy, not elsewhere classified, right lower leg: Secondary | ICD-10-CM | POA: Diagnosis not present

## 2020-02-18 DIAGNOSIS — R2689 Other abnormalities of gait and mobility: Secondary | ICD-10-CM | POA: Diagnosis not present

## 2020-02-18 DIAGNOSIS — R278 Other lack of coordination: Secondary | ICD-10-CM | POA: Diagnosis not present

## 2020-02-18 DIAGNOSIS — L03116 Cellulitis of left lower limb: Secondary | ICD-10-CM | POA: Diagnosis not present

## 2020-02-19 DIAGNOSIS — M62561 Muscle wasting and atrophy, not elsewhere classified, right lower leg: Secondary | ICD-10-CM | POA: Diagnosis not present

## 2020-02-19 DIAGNOSIS — R278 Other lack of coordination: Secondary | ICD-10-CM | POA: Diagnosis not present

## 2020-02-19 DIAGNOSIS — R2689 Other abnormalities of gait and mobility: Secondary | ICD-10-CM | POA: Diagnosis not present

## 2020-02-19 DIAGNOSIS — E1142 Type 2 diabetes mellitus with diabetic polyneuropathy: Secondary | ICD-10-CM | POA: Diagnosis not present

## 2020-02-19 DIAGNOSIS — M5126 Other intervertebral disc displacement, lumbar region: Secondary | ICD-10-CM | POA: Diagnosis not present

## 2020-02-19 DIAGNOSIS — M62562 Muscle wasting and atrophy, not elsewhere classified, left lower leg: Secondary | ICD-10-CM | POA: Diagnosis not present

## 2020-02-19 DIAGNOSIS — L03116 Cellulitis of left lower limb: Secondary | ICD-10-CM | POA: Diagnosis not present

## 2020-02-20 ENCOUNTER — Other Ambulatory Visit: Payer: Self-pay | Admitting: Adult Health

## 2020-02-20 DIAGNOSIS — E1142 Type 2 diabetes mellitus with diabetic polyneuropathy: Secondary | ICD-10-CM | POA: Diagnosis not present

## 2020-02-20 DIAGNOSIS — M62561 Muscle wasting and atrophy, not elsewhere classified, right lower leg: Secondary | ICD-10-CM | POA: Diagnosis not present

## 2020-02-20 DIAGNOSIS — M5126 Other intervertebral disc displacement, lumbar region: Secondary | ICD-10-CM | POA: Diagnosis not present

## 2020-02-20 DIAGNOSIS — L03116 Cellulitis of left lower limb: Secondary | ICD-10-CM | POA: Diagnosis not present

## 2020-02-20 DIAGNOSIS — M62562 Muscle wasting and atrophy, not elsewhere classified, left lower leg: Secondary | ICD-10-CM | POA: Diagnosis not present

## 2020-02-20 DIAGNOSIS — R278 Other lack of coordination: Secondary | ICD-10-CM | POA: Diagnosis not present

## 2020-02-20 DIAGNOSIS — R2689 Other abnormalities of gait and mobility: Secondary | ICD-10-CM | POA: Diagnosis not present

## 2020-02-20 MED ORDER — TRAMADOL HCL 50 MG PO TABS: 25.0000 mg | ORAL_TABLET | Freq: Three times a day (TID) | ORAL | 0 refills | Status: DC | PRN

## 2020-02-20 NOTE — Progress Notes (Signed)
Pt reports chronic lumbar pain that is impeding his ability to progress in therapy. He has previously refused medication for this problem but is now open to trying tramadol as topical agents have not helped. Tramadol 25 mg q 8 prn x 14 days sent.

## 2020-02-21 DIAGNOSIS — M5126 Other intervertebral disc displacement, lumbar region: Secondary | ICD-10-CM | POA: Diagnosis not present

## 2020-02-21 DIAGNOSIS — R278 Other lack of coordination: Secondary | ICD-10-CM | POA: Diagnosis not present

## 2020-02-21 DIAGNOSIS — L03116 Cellulitis of left lower limb: Secondary | ICD-10-CM | POA: Diagnosis not present

## 2020-02-21 DIAGNOSIS — R2689 Other abnormalities of gait and mobility: Secondary | ICD-10-CM | POA: Diagnosis not present

## 2020-02-21 DIAGNOSIS — M62562 Muscle wasting and atrophy, not elsewhere classified, left lower leg: Secondary | ICD-10-CM | POA: Diagnosis not present

## 2020-02-21 DIAGNOSIS — M62561 Muscle wasting and atrophy, not elsewhere classified, right lower leg: Secondary | ICD-10-CM | POA: Diagnosis not present

## 2020-02-21 DIAGNOSIS — E1142 Type 2 diabetes mellitus with diabetic polyneuropathy: Secondary | ICD-10-CM | POA: Diagnosis not present

## 2020-02-22 ENCOUNTER — Other Ambulatory Visit: Payer: Self-pay | Admitting: Adult Health

## 2020-02-22 MED ORDER — TRAMADOL HCL 50 MG PO TABS: ORAL_TABLET | ORAL | 0 refills | Status: DC

## 2020-02-24 ENCOUNTER — Telehealth: Payer: Self-pay | Admitting: Physical Medicine and Rehabilitation

## 2020-02-24 ENCOUNTER — Other Ambulatory Visit: Payer: Self-pay | Admitting: Physical Medicine and Rehabilitation

## 2020-02-24 DIAGNOSIS — E1142 Type 2 diabetes mellitus with diabetic polyneuropathy: Secondary | ICD-10-CM | POA: Diagnosis not present

## 2020-02-24 DIAGNOSIS — R2689 Other abnormalities of gait and mobility: Secondary | ICD-10-CM | POA: Diagnosis not present

## 2020-02-24 DIAGNOSIS — M62561 Muscle wasting and atrophy, not elsewhere classified, right lower leg: Secondary | ICD-10-CM | POA: Diagnosis not present

## 2020-02-24 DIAGNOSIS — M48062 Spinal stenosis, lumbar region with neurogenic claudication: Secondary | ICD-10-CM

## 2020-02-24 DIAGNOSIS — M48061 Spinal stenosis, lumbar region without neurogenic claudication: Secondary | ICD-10-CM

## 2020-02-24 DIAGNOSIS — M5416 Radiculopathy, lumbar region: Secondary | ICD-10-CM

## 2020-02-24 DIAGNOSIS — L03116 Cellulitis of left lower limb: Secondary | ICD-10-CM | POA: Diagnosis not present

## 2020-02-24 DIAGNOSIS — M5126 Other intervertebral disc displacement, lumbar region: Secondary | ICD-10-CM | POA: Diagnosis not present

## 2020-02-24 DIAGNOSIS — M62562 Muscle wasting and atrophy, not elsewhere classified, left lower leg: Secondary | ICD-10-CM | POA: Diagnosis not present

## 2020-02-24 DIAGNOSIS — R278 Other lack of coordination: Secondary | ICD-10-CM | POA: Diagnosis not present

## 2020-02-24 NOTE — Telephone Encounter (Signed)
Patient called requesting a call back as soon as possible. Patient did not state the nature of his call but did say that it's important that Loma Sousa or Ernestina Patches gives a call back. Patient phone number is [phone].

## 2020-02-24 NOTE — Telephone Encounter (Signed)
Patient called asked for a call back from Dr Ernestina Patches as soon as possible.  The number to contact patient is [phone]

## 2020-02-24 NOTE — Telephone Encounter (Signed)
Referral placed, make sure goes to right que etc.

## 2020-02-24 NOTE — Progress Notes (Signed)
Referral to Dr. Earnie Larsson by patient request. See prior notes/prcoedures for details.

## 2020-02-24 NOTE — Telephone Encounter (Signed)
Pt asked if he can get a referral put in to Dr. Earnie Larsson a neurosurgeon to be seen. Please Advise.

## 2020-02-25 ENCOUNTER — Telehealth: Payer: Self-pay

## 2020-02-25 ENCOUNTER — Telehealth: Payer: Self-pay | Admitting: Physical Medicine and Rehabilitation

## 2020-02-25 DIAGNOSIS — R2689 Other abnormalities of gait and mobility: Secondary | ICD-10-CM | POA: Diagnosis not present

## 2020-02-25 DIAGNOSIS — M62561 Muscle wasting and atrophy, not elsewhere classified, right lower leg: Secondary | ICD-10-CM | POA: Diagnosis not present

## 2020-02-25 DIAGNOSIS — M62562 Muscle wasting and atrophy, not elsewhere classified, left lower leg: Secondary | ICD-10-CM | POA: Diagnosis not present

## 2020-02-25 DIAGNOSIS — R278 Other lack of coordination: Secondary | ICD-10-CM | POA: Diagnosis not present

## 2020-02-25 DIAGNOSIS — L03116 Cellulitis of left lower limb: Secondary | ICD-10-CM | POA: Diagnosis not present

## 2020-02-25 DIAGNOSIS — M5126 Other intervertebral disc displacement, lumbar region: Secondary | ICD-10-CM | POA: Diagnosis not present

## 2020-02-25 DIAGNOSIS — E1142 Type 2 diabetes mellitus with diabetic polyneuropathy: Secondary | ICD-10-CM | POA: Diagnosis not present

## 2020-02-25 NOTE — Telephone Encounter (Signed)
Patient callng back to sch appt.

## 2020-02-25 NOTE — Telephone Encounter (Signed)
Called patient to advise that referral to Dr. Trenton Gammon was placed and they will call him to schedule.

## 2020-02-25 NOTE — Telephone Encounter (Signed)
Called to advise that referral was placed yesterday/

## 2020-02-25 NOTE — Telephone Encounter (Signed)
Received call from [facility] with [employer] She advised patient need a referral to see Dr. Trenton Gammon. Manuela Schwartz asked for a call back as soon as possible. The number to contact Manuela Schwartz is [phone]

## 2020-02-26 DIAGNOSIS — R278 Other lack of coordination: Secondary | ICD-10-CM | POA: Diagnosis not present

## 2020-02-26 DIAGNOSIS — M62561 Muscle wasting and atrophy, not elsewhere classified, right lower leg: Secondary | ICD-10-CM | POA: Diagnosis not present

## 2020-02-26 DIAGNOSIS — E1142 Type 2 diabetes mellitus with diabetic polyneuropathy: Secondary | ICD-10-CM | POA: Diagnosis not present

## 2020-02-26 DIAGNOSIS — M62562 Muscle wasting and atrophy, not elsewhere classified, left lower leg: Secondary | ICD-10-CM | POA: Diagnosis not present

## 2020-02-26 DIAGNOSIS — R2689 Other abnormalities of gait and mobility: Secondary | ICD-10-CM | POA: Diagnosis not present

## 2020-02-26 DIAGNOSIS — M5126 Other intervertebral disc displacement, lumbar region: Secondary | ICD-10-CM | POA: Diagnosis not present

## 2020-02-26 DIAGNOSIS — L03116 Cellulitis of left lower limb: Secondary | ICD-10-CM | POA: Diagnosis not present

## 2020-02-28 DIAGNOSIS — M62562 Muscle wasting and atrophy, not elsewhere classified, left lower leg: Secondary | ICD-10-CM | POA: Diagnosis not present

## 2020-02-28 DIAGNOSIS — E1142 Type 2 diabetes mellitus with diabetic polyneuropathy: Secondary | ICD-10-CM | POA: Diagnosis not present

## 2020-02-28 DIAGNOSIS — L03116 Cellulitis of left lower limb: Secondary | ICD-10-CM | POA: Diagnosis not present

## 2020-02-28 DIAGNOSIS — M5126 Other intervertebral disc displacement, lumbar region: Secondary | ICD-10-CM | POA: Diagnosis not present

## 2020-02-28 DIAGNOSIS — R278 Other lack of coordination: Secondary | ICD-10-CM | POA: Diagnosis not present

## 2020-02-28 DIAGNOSIS — M62561 Muscle wasting and atrophy, not elsewhere classified, right lower leg: Secondary | ICD-10-CM | POA: Diagnosis not present

## 2020-02-28 DIAGNOSIS — R2689 Other abnormalities of gait and mobility: Secondary | ICD-10-CM | POA: Diagnosis not present

## 2020-03-02 DIAGNOSIS — L03116 Cellulitis of left lower limb: Secondary | ICD-10-CM | POA: Diagnosis not present

## 2020-03-02 DIAGNOSIS — E1142 Type 2 diabetes mellitus with diabetic polyneuropathy: Secondary | ICD-10-CM | POA: Diagnosis not present

## 2020-03-02 DIAGNOSIS — M5126 Other intervertebral disc displacement, lumbar region: Secondary | ICD-10-CM | POA: Diagnosis not present

## 2020-03-02 DIAGNOSIS — M62562 Muscle wasting and atrophy, not elsewhere classified, left lower leg: Secondary | ICD-10-CM | POA: Diagnosis not present

## 2020-03-02 DIAGNOSIS — M62561 Muscle wasting and atrophy, not elsewhere classified, right lower leg: Secondary | ICD-10-CM | POA: Diagnosis not present

## 2020-03-02 DIAGNOSIS — R2689 Other abnormalities of gait and mobility: Secondary | ICD-10-CM | POA: Diagnosis not present

## 2020-03-02 DIAGNOSIS — R278 Other lack of coordination: Secondary | ICD-10-CM | POA: Diagnosis not present

## 2020-03-05 ENCOUNTER — Other Ambulatory Visit: Payer: Self-pay | Admitting: Internal Medicine

## 2020-03-05 DIAGNOSIS — M5416 Radiculopathy, lumbar region: Secondary | ICD-10-CM

## 2020-03-05 DIAGNOSIS — R531 Weakness: Secondary | ICD-10-CM

## 2020-03-05 DIAGNOSIS — M5136 Other intervertebral disc degeneration, lumbar region: Secondary | ICD-10-CM

## 2020-03-05 DIAGNOSIS — M5126 Other intervertebral disc displacement, lumbar region: Secondary | ICD-10-CM

## 2020-03-05 MED ORDER — TRAMADOL HCL 50 MG PO TABS: ORAL_TABLET | ORAL | 0 refills | Status: AC

## 2020-03-06 ENCOUNTER — Telehealth: Payer: Self-pay

## 2020-03-06 NOTE — Telephone Encounter (Signed)
[name] [name] would like to change [name] [name] Tramadol to 50 mg q 6hrs prn for pain. He is currently taking 25 mg every 8 hrs for pain  and 50 mg every 8hour for moderate to sever pain. She stated he is not taking it correctly and would like an verbal order for 50mg  every 6 hours because he seems to tolerate it better. Please advise?

## 2020-03-06 NOTE — Telephone Encounter (Signed)
I am ok with the new dosage and frequency for the tramadol.  Please let [name] know

## 2020-03-09 NOTE — Telephone Encounter (Signed)
Message given to [facility]

## 2020-03-11 DIAGNOSIS — M48062 Spinal stenosis, lumbar region with neurogenic claudication: Secondary | ICD-10-CM | POA: Diagnosis not present

## 2020-03-12 ENCOUNTER — Non-Acute Institutional Stay (SKILLED_NURSING_FACILITY): Payer: PPO | Admitting: Adult Health

## 2020-03-12 DIAGNOSIS — M5416 Radiculopathy, lumbar region: Secondary | ICD-10-CM

## 2020-03-12 DIAGNOSIS — I87323 Chronic venous hypertension (idiopathic) with inflammation of bilateral lower extremity: Secondary | ICD-10-CM | POA: Diagnosis not present

## 2020-03-12 DIAGNOSIS — M5126 Other intervertebral disc displacement, lumbar region: Secondary | ICD-10-CM | POA: Diagnosis not present

## 2020-03-12 DIAGNOSIS — R531 Weakness: Secondary | ICD-10-CM

## 2020-03-12 DIAGNOSIS — I10 Essential (primary) hypertension: Secondary | ICD-10-CM | POA: Diagnosis not present

## 2020-03-12 DIAGNOSIS — M5136 Other intervertebral disc degeneration, lumbar region: Secondary | ICD-10-CM

## 2020-03-13 ENCOUNTER — Encounter: Payer: Self-pay | Admitting: Adult Health

## 2020-03-13 NOTE — Progress Notes (Signed)
Location:  Occupational psychologist of Service:  SNF (31) Provider:   Cindi Carbon, ANP [place] [phone]   [name], DO  Patient Care Team: [name], DO as PCP - General (Geriatric Medicine) Community[name], [name], MD as Referring Physician (Endocrinology) [name], MD as Consulting Physician (Orthopedic Surgery) Bond, Tracie Harrier, MD as Referring Physician (Ophthalmology) Magnus Sinning, MD as Consulting Physician (Physical Medicine and Rehabilitation)  Extended Emergency Contact Information Primary Emergency Contact: Procida,[name] Address: [address]          [place], [place] [address] [name] of [place] Phone: [phone] Mobile Phone: [phone] Relation: Son Secondary Emergency Contact: [name], [place] [address] [name] of [place] Phone: [phone] Mobile Phone: [phone] Relation: Friend  Code Status: DNR Goals of care: Advanced Directive information Advanced Directives 02/11/2020  Does Patient Have a Medical Advance Directive? Yes  Type of Paramedic of [place];Out of facility DNR (pink MOST or yellow form);Living will  Does patient want to make changes to medical advance directive? No - Patient declined  Copy of [place] in Chart? Yes - validated most recent copy scanned in chart (See row information)  Would patient like information on creating a medical advance directive? -  Pre-existing out of facility DNR order (yellow form or pink MOST form) Pink MOST/Yellow Form most recent copy in chart - Physician notified to receive inpatient order     Chief Complaint  Patient presents with  . Medical Management of Chronic Issues    HPI:  Pt is a 86 y.o. male seen today for medical management of chronic diseases.  He is currently residing in skilled rehab at [facility] but previously  lived in [place]. He came one month ago due to weakness and cellulitis of the LLE. He was treated with Doxycycline and this resolved. The weakness continue and he did not make progress with therapy. He continues to need a stand up lift for transfers. Prior to his admission he was able to transfer independently but did use a motorized scooter. He continues to have lumbar back pain with radiculopathy. At first he was resistant to medication for this but later agreed to try ultram which helped. It caused some bad dreams at night so now he is only using it as needed and taking tylenol as well. The pain goes down the left leg sometimes and then the right leg other times depending on his position. He has tried back injections but feels that they do not help. MRI reviewed from 11/16/18 showed lumbar spinal stenosis and disc bulging at the L4-5 area. He is not having any numbness in the groin area and there are no change in bowel or bladder habits. He wanted to see neurosurgery and so a referral was made to Dr. Annette Stable. He is going to have another MRI later this month and meet back with Dr. Annette Stable for possible surgery. He feels his quality of life is poor and he does not want to continue to live in skilled care with the level of pain he has and dependency on staff and so he feels the risk for surgery is worth it. He has a friend who went through something similar at 89 years old that had a good outcome.  He reports his bp is not being taken correctly and that the numbers are in the 150-160 range in  the morning and 110-120 in the afternoon. He is not having any cp, sob, doe, or headaches or other issues.    Past Medical History:  Diagnosis Date  . Allergic rhinitis    Prior allergy shots 20 years  . Anemia   . Anemia, B12 deficiency 2000  . Arthritis    Status post left total replacement 8 2011  . BPH (benign prostatic hyperplasia) 2013  . Cancer ([place])    renal cell ca and skin cancer   . Colitis 2014  . Difficult  intubation 1994   surgery had to be  stopped due to injury to "throat"  . Difficult intubation 1994   no trouble since.  Required nasotracheal intubation  '02 [facility] / ANESTHESIA RECORD FROM 2013 AND 2016 IN EPIC  . GERD (gastroesophageal reflux disease)   . Glaucoma 2007   Status post left trabeculectomy 2007  . History of colon polyps    Colonoscopy 2001  . History of kidney stones   . History of renal cell carcinoma 1994   Status post right nephrectomy  . Hyperlipidemia 2003  . Hypertension   . 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 Hypothyroidism (acquired) 2000  . Nocturia   . Osteomyelitis ([place])    right great  . Osteomyelitis ([place])    RIGHT FOOT /TOE  . Pituitary mass ([place]) 2000   S/p transphenoidal excision 05/1999 ([facility])  . Vitamin D deficiency 2009   Past Surgical History:  Procedure Laterality Date  . AMPUTATION Right 12/20/2017   Procedure: RIGHT 1ST RAY AMPUTATION;  Surgeon: [name], MD;  Location: [facility];  Service: Orthopedics;  Laterality: Right;  . AMPUTATION Right 03/23/2018   Procedure: RIGHT 2ND TOE AMPUTATION;  Surgeon: [name], MD;  Location: [facility];  Service: Orthopedics;  Laterality: Right;  . BRAIN SURGERY  2000   pituatary gland removed .  Marland Kitchen CHOLECYSTECTOMY  1984  . Ectropion surgery Bilateral 2006  . EYE SURGERY  over last 6 yrs.     trabeculectomy...   . EYE SURGERY   cat ext ou  . JOINT REPLACEMENT  2011   knee left  . KNEE ARTHROSCOPY Right 02/06/2015   Procedure: RIGHT ARTHROSCOPY KNEE WITH LATERAL MENSICAL  DEBRIDEMENT;  Surgeon: Gaynelle Arabian, MD;  Location: [facility];  Service: Orthopedics;  Laterality: Right;  . Mohs procedure      for skin cancer on nose   . NEPHRECTOMY Right 1994   Renal cell  . REVERSE SHOULDER ARTHROPLASTY  12/15/2011   Procedure: REVERSE SHOULDER ARTHROPLASTY;  Surgeon: Marin Shutter, MD;  Location: [facility];  Service: Orthopedics;  Laterality: Right;  right  total reverse shoulder  . TONSILLECTOMY    . TOTAL KNEE ARTHROPLASTY Right 06/29/2015   Procedure: TOTAL KNEE ARTHROPLASTY;  Surgeon: Gaynelle Arabian, MD;  [facility];  Service: Orthopedics;  Laterality: Right;  . Transsphenoidal excision pituitary tumor  05/1999   A.Tommi Rumps, M.D.([facility])    Allergies  Allergen Reactions  . Adhesive [Tape] Other (See Comments)    CAUSES SKIN TEARS, prefers paper tape   . Lyrica [Pregabalin] Swelling  . Percocet [Oxycodone-Acetaminophen] Other (See Comments)    UNSPECIFIED REACTION  Does not want to take  . Robaxin [Methocarbamol]     UNSPECIFIED REACTION   . Diovan [Valsartan] Rash    Outpatient Encounter Medications as of 03/12/2020  Medication Sig  . acetaminophen (TYLENOL) 500 MG tablet Take 500 mg by mouth daily  as needed for fever.   . Cholecalciferol (VITAMIN D) 2000 units tablet Take 2,000 Units by mouth daily.  . Coenzyme Q10 300 MG CAPS Take 300 mg by mouth daily.   . Diclofenac Sodium 1.5 % SOLN Dispense 10 to 40 drops to affected area on skin as directed, twice per day  . erythromycin ophthalmic ointment 1 application at bedtime.  . finasteride (PROSCAR) 5 MG tablet Take 5 mg by mouth daily.   . Flaxseed, Linseed, (EQL FLAXSEED OIL) 1200 MG CAPS Take as directed once a day  . mirabegron ER (MYRBETRIQ) 25 MG TB24 tablet Take 25 mg by mouth every other day.  . Multiple Vitamins-Minerals (MULTIVITAMIN & MINERAL PO) Take 1 tablet by mouth daily.  . NON FORMULARY 1 application 4 (four) times daily as needed. CBD cream apply to affected area 4 x day as needed  . Omega-3 Fatty Acids (OMEGA-3 FISH OIL PO) Take 700 mg by mouth daily.  . pravastatin (PRAVACHOL) 80 MG tablet Take 80 mg by mouth daily.  . predniSONE (DELTASONE) 5 MG tablet Take 2.5-5 mg by mouth See admin instructions. 5mg  in the morning and 2.5mg  in the evening  . SYNTHROID 88 MCG tablet Take 88 mcg by mouth daily before breakfast.   . testosterone cypionate (DEPOTESTOSTERONE  CYPIONATE) 200 MG/ML injection Inject 30 mg (0.15 ml) intramuscularly weekly. Must discard vial after 90 days.  Marland Kitchen timolol (TIMOPTIC) 0.5 % ophthalmic solution Place 1 drop into the right eye daily.  . traMADol (ULTRAM) 50 MG tablet Give 0.5 = 25 mg orally every 8 hours PRN for mild to moderate pain and 1 tab = 50 mg orally every 8 hours PRN for severe pain  . vitamin B-12 (CYANOCOBALAMIN) 500 MCG tablet Take 500 mcg by mouth daily.   No facility-administered encounter medications on file as of 03/12/2020.    Review of Systems  Constitutional: Positive for activity change. Negative for appetite change, chills, diaphoresis, fatigue, fever and unexpected weight change.  Respiratory: Negative for cough, shortness of breath, wheezing and stridor.   Cardiovascular: Negative for chest pain, palpitations and leg swelling.  Gastrointestinal: Negative for abdominal distention, abdominal pain, constipation and diarrhea.  Genitourinary: Negative for difficulty urinating and dysuria.  Musculoskeletal: Positive for arthralgias, back pain and gait problem. Negative for joint swelling and myalgias.  Skin: Positive for wound (left heel).  Neurological: Negative for dizziness, seizures, syncope, facial asymmetry, speech difficulty, weakness and headaches.  Hematological: Negative for adenopathy. Does not bruise/bleed easily.  Psychiatric/Behavioral: Negative for agitation, behavioral problems and confusion.    Immunization History  Administered Date(s) Administered  . Fluad Quad(high Dose 65+) 04/30/2019  . Influenza, High Dose Seasonal PF 04/30/2017, 06/08/2018  . Influenza, Quadrivalent, Recombinant, Inj, Pf 05/19/2016  . Moderna SARS-COVID-2 Vaccination 09/10/2019, 10/08/2019  . Pneumococcal Conjugate-13 06/04/2015  . Pneumococcal Polysaccharide-23 06/29/2010  . Tdap 10/28/1998, 06/08/2018  . Zoster 10/27/2005  . Zoster Recombinat (Shingrix) 02/04/2018, 07/02/2018   Pertinent  Health Maintenance Due   Topic Date Due  . URINE MICROALBUMIN  Never done  . HEMOGLOBIN A1C  09/20/2018  . INFLUENZA VACCINE  03/29/2020  . OPHTHALMOLOGY EXAM  06/04/2020  . FOOT EXAM  02/09/2021  . PNA vac Low Risk Adult  Completed   Fall Risk  07/24/2019 07/17/2019 07/05/2019 03/13/2019 11/14/2018  Falls in the past year? 0 0 0 0 1  Comment Emmi Telephone Survey: data to providers prior to load - - - -  Number falls in past yr: - 0 0 0 0  Comment - - - - -  Injury with Fall? - 0 0 0 1  Risk Factor Category  - - - - -  Risk for fall due to : - - - - -   Functional Status Survey:    Vitals:   03/13/20 1211  Weight: 163 lb (73.9 kg)   Body mass index is 22.73 kg/m. Physical Exam Constitutional:      General: He is not in acute distress.    Appearance: He is not diaphoretic.  HENT:     Head: Normocephalic and atraumatic.  Neck:     Thyroid: No thyromegaly.     Vascular: No JVD.     Trachea: No tracheal deviation.  Cardiovascular:     Rate and Rhythm: Normal rate and regular rhythm.     Heart sounds: No murmur heard.   Pulmonary:     Effort: Pulmonary effort is normal. No respiratory distress.     Breath sounds: Normal breath sounds. No wheezing.  Abdominal:     General: Bowel sounds are normal. There is no distension.     Palpations: Abdomen is soft.     Tenderness: There is no abdominal tenderness.  Musculoskeletal:        General: No tenderness, deformity or signs of injury.     Right lower leg: Edema (trace ) present.     Left lower leg: Edema (trace) present.     Comments: No spine process tenderness.   Lymphadenopathy:     Cervical: No cervical adenopathy.  Skin:    General: Skin is warm and dry.  Neurological:     Mental Status: He is alert and oriented to person, place, and time.     Cranial Nerves: No cranial nerve deficit.     Labs reviewed: Recent Labs    08/29/19 0000 02/11/20 0000  NA 139 136*  K 3.9  --   CL 101  --   CO2 33* 27*  BUN 25* 19  CREATININE 1.2  0.9  CALCIUM 9.4 9.3   Recent Labs    08/29/19 0000  ALBUMIN 3.6   Recent Labs    08/29/19 0000 02/11/20 0000  WBC 11.3 10.5  HGB 12.9* 11.1*  HCT 39* 34*  PLT 233 275   Lab Results  Component Value Date   TSH <0.010 (L) 03/20/2018   Lab Results  Component Value Date   HGBA1C 6.5 (H) 03/20/2018   Lab Results  Component Value Date   CHOL 138 08/29/2019   HDL 2 (A) 08/29/2019   LDLCALC 61 08/29/2019   TRIG 72 08/29/2019    Significant Diagnostic Results in last 30 days:  No results found.  Assessment/Plan 1. Lumbar radiculopathy Increase tylenol to 1000 mg tid prn and continue as needed ultram    2. Weakness Due to #1  Continues to work with PT but will not likely progress at this point without additional intervention but certainly there is concern at his age for surgery complications. Will await input from neurosurgery.   3. Bulging of intervertebral disc between L4 and L5 As noted on MRI, see above  4. Essential hypertension Check bp manual once daily and report if consistently >150/90  5. Idiopathic chronic venous hypertension of both lower extremities with inflammation Continue compression hose and leg elevation     Family/ staff Communication: resident and his wife Tootsie  Labs/tests ordered:  NA

## 2020-03-17 DIAGNOSIS — M4807 Spinal stenosis, lumbosacral region: Secondary | ICD-10-CM | POA: Diagnosis not present

## 2020-03-17 DIAGNOSIS — M6389 Disorders of muscle in diseases classified elsewhere, multiple sites: Secondary | ICD-10-CM | POA: Diagnosis not present

## 2020-03-17 DIAGNOSIS — R41842 Visuospatial deficit: Secondary | ICD-10-CM | POA: Diagnosis not present

## 2020-03-17 DIAGNOSIS — R41840 Attention and concentration deficit: Secondary | ICD-10-CM | POA: Diagnosis not present

## 2020-03-17 DIAGNOSIS — M5442 Lumbago with sciatica, left side: Secondary | ICD-10-CM | POA: Diagnosis not present

## 2020-03-17 DIAGNOSIS — R278 Other lack of coordination: Secondary | ICD-10-CM | POA: Diagnosis not present

## 2020-03-17 DIAGNOSIS — Z4689 Encounter for fitting and adjustment of other specified devices: Secondary | ICD-10-CM | POA: Diagnosis not present

## 2020-03-18 ENCOUNTER — Other Ambulatory Visit: Payer: Self-pay | Admitting: Neurosurgery

## 2020-03-18 ENCOUNTER — Other Ambulatory Visit (HOSPITAL_COMMUNITY): Payer: Self-pay | Admitting: Neurosurgery

## 2020-03-18 DIAGNOSIS — M6389 Disorders of muscle in diseases classified elsewhere, multiple sites: Secondary | ICD-10-CM | POA: Diagnosis not present

## 2020-03-18 DIAGNOSIS — R41840 Attention and concentration deficit: Secondary | ICD-10-CM | POA: Diagnosis not present

## 2020-03-18 DIAGNOSIS — M5442 Lumbago with sciatica, left side: Secondary | ICD-10-CM | POA: Diagnosis not present

## 2020-03-18 DIAGNOSIS — M4807 Spinal stenosis, lumbosacral region: Secondary | ICD-10-CM | POA: Diagnosis not present

## 2020-03-18 DIAGNOSIS — R278 Other lack of coordination: Secondary | ICD-10-CM | POA: Diagnosis not present

## 2020-03-18 DIAGNOSIS — Z4689 Encounter for fitting and adjustment of other specified devices: Secondary | ICD-10-CM | POA: Diagnosis not present

## 2020-03-18 DIAGNOSIS — M48062 Spinal stenosis, lumbar region with neurogenic claudication: Secondary | ICD-10-CM

## 2020-03-18 DIAGNOSIS — R41842 Visuospatial deficit: Secondary | ICD-10-CM | POA: Diagnosis not present

## 2020-03-20 DIAGNOSIS — R41840 Attention and concentration deficit: Secondary | ICD-10-CM | POA: Diagnosis not present

## 2020-03-20 DIAGNOSIS — M4807 Spinal stenosis, lumbosacral region: Secondary | ICD-10-CM | POA: Diagnosis not present

## 2020-03-20 DIAGNOSIS — M6389 Disorders of muscle in diseases classified elsewhere, multiple sites: Secondary | ICD-10-CM | POA: Diagnosis not present

## 2020-03-20 DIAGNOSIS — Z4689 Encounter for fitting and adjustment of other specified devices: Secondary | ICD-10-CM | POA: Diagnosis not present

## 2020-03-20 DIAGNOSIS — R41842 Visuospatial deficit: Secondary | ICD-10-CM | POA: Diagnosis not present

## 2020-03-20 DIAGNOSIS — M5442 Lumbago with sciatica, left side: Secondary | ICD-10-CM | POA: Diagnosis not present

## 2020-03-20 DIAGNOSIS — R278 Other lack of coordination: Secondary | ICD-10-CM | POA: Diagnosis not present

## 2020-03-23 ENCOUNTER — Non-Acute Institutional Stay (SKILLED_NURSING_FACILITY): Payer: PPO | Admitting: Adult Health

## 2020-03-23 ENCOUNTER — Encounter: Payer: Self-pay | Admitting: Adult Health

## 2020-03-23 DIAGNOSIS — Z4689 Encounter for fitting and adjustment of other specified devices: Secondary | ICD-10-CM | POA: Diagnosis not present

## 2020-03-23 DIAGNOSIS — M4807 Spinal stenosis, lumbosacral region: Secondary | ICD-10-CM | POA: Diagnosis not present

## 2020-03-23 DIAGNOSIS — I10 Essential (primary) hypertension: Secondary | ICD-10-CM

## 2020-03-23 DIAGNOSIS — M5416 Radiculopathy, lumbar region: Secondary | ICD-10-CM | POA: Diagnosis not present

## 2020-03-23 DIAGNOSIS — M6389 Disorders of muscle in diseases classified elsewhere, multiple sites: Secondary | ICD-10-CM | POA: Diagnosis not present

## 2020-03-23 DIAGNOSIS — R41842 Visuospatial deficit: Secondary | ICD-10-CM | POA: Diagnosis not present

## 2020-03-23 DIAGNOSIS — R41840 Attention and concentration deficit: Secondary | ICD-10-CM | POA: Diagnosis not present

## 2020-03-23 DIAGNOSIS — R278 Other lack of coordination: Secondary | ICD-10-CM | POA: Diagnosis not present

## 2020-03-23 DIAGNOSIS — M5442 Lumbago with sciatica, left side: Secondary | ICD-10-CM | POA: Diagnosis not present

## 2020-03-23 MED ORDER — AMLODIPINE BESYLATE 2.5 MG PO TABS: 2.5000 mg | ORAL_TABLET | Freq: Every day | ORAL | 0 refills | Status: DC

## 2020-03-23 MED ORDER — TRAMADOL HCL 50 MG PO TABS: 25.0000 mg | ORAL_TABLET | Freq: Four times a day (QID) | ORAL | 1 refills | Status: DC | PRN

## 2020-03-23 NOTE — Progress Notes (Addendum)
Location:  Occupational psychologist of Service:  SNF (31) Provider:   Cindi Carbon, ANP [place] [phone]   [name], DO  Patient Care Team: [name], DO as PCP - General (Geriatric Medicine) [name], [name], MD as Referring Physician (Endocrinology) [name], MD as Consulting Physician (Orthopedic Surgery) Bond, Tracie Harrier, MD as Referring Physician (Ophthalmology) Magnus Sinning, MD as Consulting Physician (Physical Medicine and Rehabilitation)  Extended Emergency Contact Information Primary Emergency Contact: Seely,[name] Address: [address]          [place], [place] [address] [name] of [place] Phone: [phone] Mobile Phone: [phone] Relation: Son Secondary Emergency Contact: [name], [place] [address] [name] of [place] Phone: [phone] Mobile Phone: [phone] Relation: Friend  Code Status:  DNR Goals of care: Advanced Directive information Advanced Directives 02/11/2020  Does Patient Have a Medical Advance Directive? Yes  Type of Paramedic of [place];Out of facility DNR (pink MOST or yellow form);Living will  Does patient want to make changes to medical advance directive? No - Patient declined  Copy of [place] in Chart? Yes - validated most recent copy scanned in chart (See row information)  Would patient like information on creating a medical advance directive? -  Pre-existing out of facility DNR order (yellow form or pink MOST form) Pink MOST/Yellow Form most recent copy in chart - Physician notified to receive inpatient order     Chief Complaint  Patient presents with  . Acute Visit    htn, med refill    HPI:  Pt is a 86 y.o. male seen today for an acute visit for htn and med refill. Mr. Nippert resides in skilled rehab due to weakness associated with spinal  stenosis/lumbar radiculopathy. He is awaiting input from Dr.Pool regarding surgery. He continues with moderate to severe pain in the lumbar area with radiation into the legs. No change in bowel or bladder habits. He is using prn tylenol and ultram. He reports that this helps. He does not want to schedule the meds or take anything stronger as it makes him confused. His bp has been running high at times 384-536 systolic. He does not have any chest pain or sob. He reports that he took a bp med several years ago and stopped it for unclear reasons. In review of epic at one point he was on Norvasc.    Past Medical History:  Diagnosis Date  . Allergic rhinitis    Prior allergy shots 20 years  . Anemia   . Anemia, B12 deficiency 2000  . Arthritis    Status post left total replacement 8 2011  . BPH (benign prostatic hyperplasia) 2013  . Cancer ([place])    renal cell ca and skin cancer   . Colitis 2014  . Difficult intubation 1994   surgery had to be  stopped due to injury to "throat"  . Difficult intubation 1994   no trouble since.  Required nasotracheal intubation  '02 [facility] / ANESTHESIA RECORD FROM 2013 AND 2016 IN EPIC  . GERD (gastroesophageal reflux disease)   . Glaucoma 2007   Status post left trabeculectomy 2007  . History of colon polyps    Colonoscopy 2001  . History of kidney stones   . History of renal cell carcinoma 1994   Status post right nephrectomy  . Hyperlipidemia 2003  . Hypertension   .  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 Hypothyroidism (acquired) 2000  . Nocturia   . Osteomyelitis ([place])    right great  . Osteomyelitis ([place])    RIGHT FOOT /TOE  . Pituitary mass ([place]) 2000   S/p transphenoidal excision 05/1999 ([facility])  . Vitamin D deficiency 2009   Past Surgical History:  Procedure Laterality Date  . AMPUTATION Right 12/20/2017   Procedure: RIGHT 1ST RAY AMPUTATION;  Surgeon: [name], MD;  Location: [facility];  Service: Orthopedics;  Laterality: Right;  . AMPUTATION Right 03/23/2018   Procedure: RIGHT 2ND TOE AMPUTATION;  Surgeon: [name], MD;  Location: [facility];  Service: Orthopedics;  Laterality: Right;  . BRAIN SURGERY  2000   pituatary gland removed .  Marland Kitchen CHOLECYSTECTOMY  1984  . Ectropion surgery Bilateral 2006  . EYE SURGERY  over last 6 yrs.     trabeculectomy...   . EYE SURGERY   cat ext ou  . JOINT REPLACEMENT  2011   knee left  . KNEE ARTHROSCOPY Right 02/06/2015   Procedure: RIGHT ARTHROSCOPY KNEE WITH LATERAL MENSICAL  DEBRIDEMENT;  Surgeon: Gaynelle Arabian, MD;  [facility];  Service: Orthopedics;  Laterality: Right;  . Mohs procedure      for skin cancer on nose   . NEPHRECTOMY Right 1994   Renal cell  . REVERSE SHOULDER ARTHROPLASTY  12/15/2011   Procedure: REVERSE SHOULDER ARTHROPLASTY;  Surgeon: Marin Shutter, MD;  Location: [facility];  Service: Orthopedics;  Laterality: Right;  right total reverse shoulder  . TONSILLECTOMY    . TOTAL KNEE ARTHROPLASTY Right 06/29/2015   Procedure: TOTAL KNEE ARTHROPLASTY;  Surgeon: Gaynelle Arabian, MD;  [facility];  Service: Orthopedics;  Laterality: Right;  . Transsphenoidal excision pituitary tumor  05/1999   A.Tommi Rumps, M.D.([facility])    Allergies  Allergen Reactions  . Adhesive [Tape] Other (See Comments)    CAUSES SKIN TEARS, prefers paper tape   . Lyrica [Pregabalin] Swelling  . Percocet [Oxycodone-Acetaminophen] Other (See Comments)    UNSPECIFIED REACTION  Does not want to take  . Robaxin [Methocarbamol]     UNSPECIFIED REACTION   . Diovan [Valsartan] Rash    Outpatient Encounter Medications as of 03/23/2020  Medication Sig  . amLODipine (NORVASC) 2.5 MG tablet Take 1 tablet (2.5 mg total) by mouth daily.  . traMADol (ULTRAM) 50 MG tablet Take 0.5-1 tablets (25-50 mg total) by mouth every 6 (six) hours as needed. 25 for mild to moderate pain, 50 for severe pain  . [DISCONTINUED] amLODipine (NORVASC) 2.5 MG tablet  Take 2.5 mg by mouth daily.  . [DISCONTINUED] traMADol (ULTRAM) 50 MG tablet Take 25-50 mg by mouth every 6 (six) hours as needed. 25 for mild to moderate pain, 50 for severe pain  . acetaminophen (TYLENOL) 500 MG tablet Take 1,000 mg by mouth 3 (three) times daily as needed for fever.   . Cholecalciferol (VITAMIN D) 2000 units tablet Take 2,000 Units by mouth daily.  . Coenzyme Q10 300 MG CAPS Take 300 mg by mouth daily.   . Diclofenac Sodium 1.5 % SOLN Dispense 10 to 40 drops to affected area on skin as directed, twice per day  . erythromycin ophthalmic ointment 1 application at bedtime.  . finasteride (PROSCAR) 5 MG tablet Take 5 mg by mouth daily.   . Flaxseed, Linseed, (EQL FLAXSEED OIL) 1200 MG CAPS Take as directed once a day  . fluticasone (FLONASE) 50 MCG/ACT  nasal spray Place 2 sprays into both nostrils daily.  . mirabegron ER (MYRBETRIQ) 25 MG TB24 tablet Take 25 mg by mouth every other day.  . Multiple Vitamins-Minerals (MULTIVITAMIN & MINERAL PO) Take 1 tablet by mouth daily.  . NON FORMULARY 1 application 4 (four) times daily as needed. CBD cream apply to affected area 4 x day as needed  . Omega-3 Fatty Acids (OMEGA-3 FISH OIL PO) Take 700 mg by mouth daily.  . pravastatin (PRAVACHOL) 80 MG tablet Take 80 mg by mouth daily.  . predniSONE (DELTASONE) 5 MG tablet Take 2.5-5 mg by mouth See admin instructions. 5mg  in the morning and 2.5mg  in the evening  . SYNTHROID 88 MCG tablet Take 88 mcg by mouth daily before breakfast.   . testosterone cypionate (DEPOTESTOSTERONE CYPIONATE) 200 MG/ML injection Inject 30 mg (0.15 ml) intramuscularly weekly. Must discard vial after 90 days.  Marland Kitchen timolol (TIMOPTIC) 0.5 % ophthalmic solution Place 1 drop into the right eye daily.  . vitamin B-12 (CYANOCOBALAMIN) 500 MCG tablet Take 500 mcg by mouth daily.   No facility-administered encounter medications on file as of 03/23/2020.    Review of Systems  Constitutional: Positive for activity change.  Negative for appetite change, chills, diaphoresis, fatigue, fever and unexpected weight change.  Respiratory: Negative for cough, shortness of breath and wheezing.   Cardiovascular: Positive for leg swelling. Negative for chest pain and palpitations.  Gastrointestinal: Negative for abdominal distention, constipation and diarrhea.  Musculoskeletal: Positive for back pain.  Psychiatric/Behavioral: Negative for agitation, behavioral problems and confusion.    Immunization History  Administered Date(s) Administered  . Fluad Quad(high Dose 65+) 04/30/2019  . Influenza, High Dose Seasonal PF 04/30/2017, 06/08/2018  . Influenza, Quadrivalent, Recombinant, Inj, Pf 05/19/2016  . Moderna SARS-COVID-2 Vaccination 09/10/2019, 10/08/2019  . Pneumococcal Conjugate-13 06/04/2015  . Pneumococcal Polysaccharide-23 06/29/2010  . Tdap 10/28/1998, 06/08/2018  . Zoster 10/27/2005  . Zoster Recombinat (Shingrix) 02/04/2018, 07/02/2018   Pertinent  Health Maintenance Due  Topic Date Due  . URINE MICROALBUMIN  Never done  . HEMOGLOBIN A1C  09/20/2018  . INFLUENZA VACCINE  03/29/2020  . OPHTHALMOLOGY EXAM  06/04/2020  . FOOT EXAM  02/09/2021  . PNA vac Low Risk Adult  Completed   Fall Risk  07/24/2019 07/17/2019 07/05/2019 03/13/2019 11/14/2018  Falls in the past year? 0 0 0 0 1  Comment Emmi Telephone Survey: data to providers prior to load - - - -  Number falls in past yr: - 0 0 0 0  Comment - - - - -  Injury with Fall? - 0 0 0 1  Risk Factor Category  - - - - -  Risk for fall due to : - - - - -   Functional Status Survey:    Vitals:   03/23/20 1542  BP: (!) 158/68   There is no height or weight on file to calculate BMI. Physical Exam Vitals and nursing note reviewed.  Constitutional:      Appearance: Normal appearance.  Skin:    General: Skin is warm and dry.  Neurological:     General: No focal deficit present.     Mental Status: He is alert and oriented to person, place, and time.  Mental status is at baseline.  Psychiatric:        Mood and Affect: Mood normal.     Labs reviewed: Recent Labs    08/29/19 0000 02/11/20 0000  NA 139 136*  K 3.9  --   CL 101  --  CO2 33* 27*  BUN 25* 19  CREATININE 1.2 0.9  CALCIUM 9.4 9.3   Recent Labs    08/29/19 0000  ALBUMIN 3.6   Recent Labs    08/29/19 0000 02/11/20 0000  WBC 11.3 10.5  HGB 12.9* 11.1*  HCT 39* 34*  PLT 233 275   Lab Results  Component Value Date   TSH <0.010 (L) 03/20/2018   Lab Results  Component Value Date   HGBA1C 6.5 (H) 03/20/2018   Lab Results  Component Value Date   CHOL 138 08/29/2019   HDL 2 (A) 08/29/2019   LDLCALC 61 08/29/2019   TRIG 72 08/29/2019    Significant Diagnostic Results in last 30 days:  No results found.  Assessment/Plan 1. Essential hypertension We were going to restart the Norvasc that he was previously on but upon further review of the chart it appears that Dr. Elyse Hsu had been notified of his BP and stated that resuming meds was not indicated at this time and that we could reduce his BP checks.    2. Lumbar radiculopathy Continues with pain but does not want to take additional medication due to s/e.  He is scheduled to f/u with Dr. Annette Stable for MRI and surgical consult Continue Tylenol and tramadol as needed     Family/ staff Communication: discussed with Mr Milo  Labs/tests ordered:  NA

## 2020-03-23 NOTE — Addendum Note (Signed)
Addended by: Barnie Mort on: 03/23/2020 04:04 PM   Modules accepted: Orders

## 2020-03-24 DIAGNOSIS — R278 Other lack of coordination: Secondary | ICD-10-CM | POA: Diagnosis not present

## 2020-03-24 DIAGNOSIS — M5442 Lumbago with sciatica, left side: Secondary | ICD-10-CM | POA: Diagnosis not present

## 2020-03-24 DIAGNOSIS — E1142 Type 2 diabetes mellitus with diabetic polyneuropathy: Secondary | ICD-10-CM | POA: Diagnosis not present

## 2020-03-24 DIAGNOSIS — L03116 Cellulitis of left lower limb: Secondary | ICD-10-CM | POA: Diagnosis not present

## 2020-03-24 DIAGNOSIS — M5126 Other intervertebral disc displacement, lumbar region: Secondary | ICD-10-CM | POA: Diagnosis not present

## 2020-03-24 DIAGNOSIS — M6389 Disorders of muscle in diseases classified elsewhere, multiple sites: Secondary | ICD-10-CM | POA: Diagnosis not present

## 2020-03-24 DIAGNOSIS — R2689 Other abnormalities of gait and mobility: Secondary | ICD-10-CM | POA: Diagnosis not present

## 2020-03-24 DIAGNOSIS — Z4689 Encounter for fitting and adjustment of other specified devices: Secondary | ICD-10-CM | POA: Diagnosis not present

## 2020-03-24 DIAGNOSIS — M62561 Muscle wasting and atrophy, not elsewhere classified, right lower leg: Secondary | ICD-10-CM | POA: Diagnosis not present

## 2020-03-24 DIAGNOSIS — R41842 Visuospatial deficit: Secondary | ICD-10-CM | POA: Diagnosis not present

## 2020-03-24 DIAGNOSIS — R41840 Attention and concentration deficit: Secondary | ICD-10-CM | POA: Diagnosis not present

## 2020-03-24 DIAGNOSIS — M4807 Spinal stenosis, lumbosacral region: Secondary | ICD-10-CM | POA: Diagnosis not present

## 2020-03-24 DIAGNOSIS — M62562 Muscle wasting and atrophy, not elsewhere classified, left lower leg: Secondary | ICD-10-CM | POA: Diagnosis not present

## 2020-03-25 DIAGNOSIS — M4807 Spinal stenosis, lumbosacral region: Secondary | ICD-10-CM | POA: Diagnosis not present

## 2020-03-25 DIAGNOSIS — R278 Other lack of coordination: Secondary | ICD-10-CM | POA: Diagnosis not present

## 2020-03-25 DIAGNOSIS — R41842 Visuospatial deficit: Secondary | ICD-10-CM | POA: Diagnosis not present

## 2020-03-25 DIAGNOSIS — M5442 Lumbago with sciatica, left side: Secondary | ICD-10-CM | POA: Diagnosis not present

## 2020-03-25 DIAGNOSIS — Z4689 Encounter for fitting and adjustment of other specified devices: Secondary | ICD-10-CM | POA: Diagnosis not present

## 2020-03-25 DIAGNOSIS — M6389 Disorders of muscle in diseases classified elsewhere, multiple sites: Secondary | ICD-10-CM | POA: Diagnosis not present

## 2020-03-25 DIAGNOSIS — R41840 Attention and concentration deficit: Secondary | ICD-10-CM | POA: Diagnosis not present

## 2020-03-26 DIAGNOSIS — M5442 Lumbago with sciatica, left side: Secondary | ICD-10-CM | POA: Diagnosis not present

## 2020-03-26 DIAGNOSIS — R41842 Visuospatial deficit: Secondary | ICD-10-CM | POA: Diagnosis not present

## 2020-03-26 DIAGNOSIS — M6389 Disorders of muscle in diseases classified elsewhere, multiple sites: Secondary | ICD-10-CM | POA: Diagnosis not present

## 2020-03-26 DIAGNOSIS — R278 Other lack of coordination: Secondary | ICD-10-CM | POA: Diagnosis not present

## 2020-03-26 DIAGNOSIS — Z4689 Encounter for fitting and adjustment of other specified devices: Secondary | ICD-10-CM | POA: Diagnosis not present

## 2020-03-26 DIAGNOSIS — M4807 Spinal stenosis, lumbosacral region: Secondary | ICD-10-CM | POA: Diagnosis not present

## 2020-03-26 DIAGNOSIS — R41840 Attention and concentration deficit: Secondary | ICD-10-CM | POA: Diagnosis not present

## 2020-03-31 DIAGNOSIS — M6389 Disorders of muscle in diseases classified elsewhere, multiple sites: Secondary | ICD-10-CM | POA: Diagnosis not present

## 2020-03-31 DIAGNOSIS — M4807 Spinal stenosis, lumbosacral region: Secondary | ICD-10-CM | POA: Diagnosis not present

## 2020-03-31 DIAGNOSIS — Z4689 Encounter for fitting and adjustment of other specified devices: Secondary | ICD-10-CM | POA: Diagnosis not present

## 2020-03-31 DIAGNOSIS — R41842 Visuospatial deficit: Secondary | ICD-10-CM | POA: Diagnosis not present

## 2020-03-31 DIAGNOSIS — R41840 Attention and concentration deficit: Secondary | ICD-10-CM | POA: Diagnosis not present

## 2020-03-31 DIAGNOSIS — R278 Other lack of coordination: Secondary | ICD-10-CM | POA: Diagnosis not present

## 2020-03-31 DIAGNOSIS — M5442 Lumbago with sciatica, left side: Secondary | ICD-10-CM | POA: Diagnosis not present

## 2020-04-01 ENCOUNTER — Other Ambulatory Visit: Payer: Self-pay

## 2020-04-01 ENCOUNTER — Ambulatory Visit (HOSPITAL_COMMUNITY)
Admission: RE | Admit: 2020-04-01 | Discharge: 2020-04-01 | Disposition: A | Discharge status: Home / Self Care | Payer: PPO | Source: Ambulatory Visit | Attending: Neurosurgery | Admitting: Neurosurgery

## 2020-04-01 DIAGNOSIS — M4319 Spondylolisthesis, multiple sites in spine: Secondary | ICD-10-CM | POA: Diagnosis not present

## 2020-04-01 DIAGNOSIS — M48062 Spinal stenosis, lumbar region with neurogenic claudication: Secondary | ICD-10-CM | POA: Insufficient documentation

## 2020-04-01 DIAGNOSIS — M48061 Spinal stenosis, lumbar region without neurogenic claudication: Secondary | ICD-10-CM | POA: Diagnosis not present

## 2020-04-01 DIAGNOSIS — M47817 Spondylosis without myelopathy or radiculopathy, lumbosacral region: Secondary | ICD-10-CM | POA: Diagnosis not present

## 2020-04-01 DIAGNOSIS — S32050A Wedge compression fracture of fifth lumbar vertebra, initial encounter for closed fracture: Secondary | ICD-10-CM | POA: Diagnosis not present

## 2020-04-02 DIAGNOSIS — M4807 Spinal stenosis, lumbosacral region: Secondary | ICD-10-CM | POA: Diagnosis not present

## 2020-04-02 DIAGNOSIS — R41842 Visuospatial deficit: Secondary | ICD-10-CM | POA: Diagnosis not present

## 2020-04-02 DIAGNOSIS — M5442 Lumbago with sciatica, left side: Secondary | ICD-10-CM | POA: Diagnosis not present

## 2020-04-02 DIAGNOSIS — Z4689 Encounter for fitting and adjustment of other specified devices: Secondary | ICD-10-CM | POA: Diagnosis not present

## 2020-04-02 DIAGNOSIS — M6389 Disorders of muscle in diseases classified elsewhere, multiple sites: Secondary | ICD-10-CM | POA: Diagnosis not present

## 2020-04-02 DIAGNOSIS — R278 Other lack of coordination: Secondary | ICD-10-CM | POA: Diagnosis not present

## 2020-04-02 DIAGNOSIS — R41840 Attention and concentration deficit: Secondary | ICD-10-CM | POA: Diagnosis not present

## 2020-04-06 DIAGNOSIS — M6389 Disorders of muscle in diseases classified elsewhere, multiple sites: Secondary | ICD-10-CM | POA: Diagnosis not present

## 2020-04-06 DIAGNOSIS — R278 Other lack of coordination: Secondary | ICD-10-CM | POA: Diagnosis not present

## 2020-04-06 DIAGNOSIS — R41840 Attention and concentration deficit: Secondary | ICD-10-CM | POA: Diagnosis not present

## 2020-04-06 DIAGNOSIS — M5442 Lumbago with sciatica, left side: Secondary | ICD-10-CM | POA: Diagnosis not present

## 2020-04-06 DIAGNOSIS — R41842 Visuospatial deficit: Secondary | ICD-10-CM | POA: Diagnosis not present

## 2020-04-06 DIAGNOSIS — Z4689 Encounter for fitting and adjustment of other specified devices: Secondary | ICD-10-CM | POA: Diagnosis not present

## 2020-04-06 DIAGNOSIS — M4807 Spinal stenosis, lumbosacral region: Secondary | ICD-10-CM | POA: Diagnosis not present

## 2020-04-07 DIAGNOSIS — M4807 Spinal stenosis, lumbosacral region: Secondary | ICD-10-CM | POA: Diagnosis not present

## 2020-04-07 DIAGNOSIS — M5442 Lumbago with sciatica, left side: Secondary | ICD-10-CM | POA: Diagnosis not present

## 2020-04-07 DIAGNOSIS — M6389 Disorders of muscle in diseases classified elsewhere, multiple sites: Secondary | ICD-10-CM | POA: Diagnosis not present

## 2020-04-07 DIAGNOSIS — R41842 Visuospatial deficit: Secondary | ICD-10-CM | POA: Diagnosis not present

## 2020-04-07 DIAGNOSIS — R41840 Attention and concentration deficit: Secondary | ICD-10-CM | POA: Diagnosis not present

## 2020-04-07 DIAGNOSIS — Z4689 Encounter for fitting and adjustment of other specified devices: Secondary | ICD-10-CM | POA: Diagnosis not present

## 2020-04-07 DIAGNOSIS — R278 Other lack of coordination: Secondary | ICD-10-CM | POA: Diagnosis not present

## 2020-04-08 DIAGNOSIS — M6389 Disorders of muscle in diseases classified elsewhere, multiple sites: Secondary | ICD-10-CM | POA: Diagnosis not present

## 2020-04-08 DIAGNOSIS — Z4689 Encounter for fitting and adjustment of other specified devices: Secondary | ICD-10-CM | POA: Diagnosis not present

## 2020-04-08 DIAGNOSIS — M48062 Spinal stenosis, lumbar region with neurogenic claudication: Secondary | ICD-10-CM | POA: Diagnosis not present

## 2020-04-08 DIAGNOSIS — R41842 Visuospatial deficit: Secondary | ICD-10-CM | POA: Diagnosis not present

## 2020-04-08 DIAGNOSIS — R278 Other lack of coordination: Secondary | ICD-10-CM | POA: Diagnosis not present

## 2020-04-08 DIAGNOSIS — M5442 Lumbago with sciatica, left side: Secondary | ICD-10-CM | POA: Diagnosis not present

## 2020-04-08 DIAGNOSIS — M4807 Spinal stenosis, lumbosacral region: Secondary | ICD-10-CM | POA: Diagnosis not present

## 2020-04-08 DIAGNOSIS — R41840 Attention and concentration deficit: Secondary | ICD-10-CM | POA: Diagnosis not present

## 2020-04-08 DIAGNOSIS — S32020A Wedge compression fracture of second lumbar vertebra, initial encounter for closed fracture: Secondary | ICD-10-CM | POA: Diagnosis not present

## 2020-04-10 DIAGNOSIS — R278 Other lack of coordination: Secondary | ICD-10-CM | POA: Diagnosis not present

## 2020-04-10 DIAGNOSIS — M6389 Disorders of muscle in diseases classified elsewhere, multiple sites: Secondary | ICD-10-CM | POA: Diagnosis not present

## 2020-04-10 DIAGNOSIS — Z4689 Encounter for fitting and adjustment of other specified devices: Secondary | ICD-10-CM | POA: Diagnosis not present

## 2020-04-10 DIAGNOSIS — R41842 Visuospatial deficit: Secondary | ICD-10-CM | POA: Diagnosis not present

## 2020-04-10 DIAGNOSIS — R41840 Attention and concentration deficit: Secondary | ICD-10-CM | POA: Diagnosis not present

## 2020-04-10 DIAGNOSIS — M5442 Lumbago with sciatica, left side: Secondary | ICD-10-CM | POA: Diagnosis not present

## 2020-04-10 DIAGNOSIS — M4807 Spinal stenosis, lumbosacral region: Secondary | ICD-10-CM | POA: Diagnosis not present

## 2020-04-13 ENCOUNTER — Other Ambulatory Visit: Payer: Self-pay | Admitting: Neurosurgery

## 2020-04-13 DIAGNOSIS — M5442 Lumbago with sciatica, left side: Secondary | ICD-10-CM | POA: Diagnosis not present

## 2020-04-13 DIAGNOSIS — Z4689 Encounter for fitting and adjustment of other specified devices: Secondary | ICD-10-CM | POA: Diagnosis not present

## 2020-04-13 DIAGNOSIS — M4807 Spinal stenosis, lumbosacral region: Secondary | ICD-10-CM | POA: Diagnosis not present

## 2020-04-13 DIAGNOSIS — R41840 Attention and concentration deficit: Secondary | ICD-10-CM | POA: Diagnosis not present

## 2020-04-13 DIAGNOSIS — R41842 Visuospatial deficit: Secondary | ICD-10-CM | POA: Diagnosis not present

## 2020-04-13 DIAGNOSIS — R278 Other lack of coordination: Secondary | ICD-10-CM | POA: Diagnosis not present

## 2020-04-13 DIAGNOSIS — M6389 Disorders of muscle in diseases classified elsewhere, multiple sites: Secondary | ICD-10-CM | POA: Diagnosis not present

## 2020-04-14 DIAGNOSIS — R41842 Visuospatial deficit: Secondary | ICD-10-CM | POA: Diagnosis not present

## 2020-04-14 DIAGNOSIS — R41840 Attention and concentration deficit: Secondary | ICD-10-CM | POA: Diagnosis not present

## 2020-04-14 DIAGNOSIS — M4807 Spinal stenosis, lumbosacral region: Secondary | ICD-10-CM | POA: Diagnosis not present

## 2020-04-14 DIAGNOSIS — M5442 Lumbago with sciatica, left side: Secondary | ICD-10-CM | POA: Diagnosis not present

## 2020-04-14 DIAGNOSIS — M6389 Disorders of muscle in diseases classified elsewhere, multiple sites: Secondary | ICD-10-CM | POA: Diagnosis not present

## 2020-04-14 DIAGNOSIS — Z4689 Encounter for fitting and adjustment of other specified devices: Secondary | ICD-10-CM | POA: Diagnosis not present

## 2020-04-14 DIAGNOSIS — R278 Other lack of coordination: Secondary | ICD-10-CM | POA: Diagnosis not present

## 2020-04-19 ENCOUNTER — Encounter (HOSPITAL_COMMUNITY): Payer: Self-pay | Admitting: Neurosurgery

## 2020-04-19 NOTE — Progress Notes (Signed)
Patient resides at [facility], spoke with Rehab Nurse Hanley Hays [phone] for PAT call.  Will fax instructions to [phone].     PCP - Royal Hawthorn, NP/Dr Hollace Kinnier Cardiologist - n/a Endocrinology - Dr [name] [name]  Chest x-ray - n/a EKG - DOS 04/21/20 Stress Test - n/a ECHO - 03/20/18 Cardiac Cath - n/a  Fasting Blood Sugar - Unknown Checks Blood Sugar  0 times a day  . If your blood sugar is less than 70 mg/dL, you will need to treat for low blood sugar: o Treat a low blood sugar (less than 70 mg/dL) with  cup of clear juice (cranberry or apple), 4 glucose tablets, OR glucose gel. o Recheck blood sugar in 15 minutes after treatment (to make sure it is greater than 70 mg/dL). If your blood sugar is not greater than 70 mg/dL on recheck, call [phone] for further instructions.  Anesthesia review: Yes.   STOP now taking any Aspirin (unless otherwise instructed by your surgeon), Aleve, Naproxen, Ibuprofen, Motrin, Advil, Goody's, BC's, all herbal medications, fish oil, and all vitamins.   Coronavirus Screening Covid test on DOS Do you have any of the following symptoms:  Cough yes/no: No Fever (>100.43F)  yes/no: No Runny nose yes/no: No Sore throat yes/no: No Difficulty breathing/shortness of breath  yes/no: No  Have you traveled in the last 14 days and where? No  Nurse [name] verbalized understanding of instructions that were given via phone.

## 2020-04-20 ENCOUNTER — Encounter (HOSPITAL_COMMUNITY): Payer: Self-pay | Admitting: Neurosurgery

## 2020-04-20 NOTE — Anesthesia Preprocedure Evaluation (Addendum)
Anesthesia Evaluation  Patient identified by MRN, date of birth, ID band Patient awake    Reviewed: Allergy & Precautions, NPO status , Patient's Chart, lab work & pertinent test results  History of Anesthesia Complications (+) DIFFICULT AIRWAY  Airway Mallampati: IV  TM Distance: >3 FB Neck ROM: Limited  Mouth opening: Limited Mouth Opening  Dental  (+) Dental Advisory Given, Poor Dentition, Missing, Chipped   Pulmonary former smoker,  04/21/2020 SARS coronavirus NEG   breath sounds clear to auscultation       Cardiovascular hypertension (on no meds), (-) angina+ Peripheral Vascular Disease   Rhythm:Irregular Rate:Normal  '19 ECHO: EF 55-60%, mild AI   Neuro/Psych H/o pituitary adenoma resection    GI/Hepatic Neg liver ROS, GERD  Controlled,  Endo/Other  diabetes (diet controlled)Hypothyroidism Panhypopit: on replacement steroids  Renal/GU Renal InsufficiencyRenal disease     Musculoskeletal   Abdominal   Peds  Hematology   Anesthesia Other Findings   Reproductive/Obstetrics                           Anesthesia Physical Anesthesia Plan  ASA: III  Anesthesia Plan: General   Post-op Pain Management:    Induction: Intravenous  PONV Risk Score and Plan: 3 and Ondansetron, Dexamethasone and Treatment may vary due to age or medical condition  Airway Management Planned: Oral ETT and Video Laryngoscope Planned  Additional Equipment: None  Intra-op Plan:   Post-operative Plan: Extubation in OR  Informed Consent: I have reviewed the patients History and Physical, chart, labs and discussed the procedure including the risks, benefits and alternatives for the proposed anesthesia with the patient or authorized representative who has indicated his/her understanding and acceptance.     Dental advisory given  Plan Discussed with: CRNA and Surgeon  Anesthesia Plan Comments: (See PAT note  written 04/20/2020 by Myra Gianotti, PA-C. SAME DAY WORK-UP.  History of awake intubation 12/15/11 at [facility] See records in Epic. )      Anesthesia Quick Evaluation

## 2020-04-20 NOTE — Progress Notes (Signed)
  [place], [place] [address] Phone: [phone] Fax: [place], [place] [address] Phone: [phone] Fax: [phone]     Your procedure is scheduled on Tuesday, 04/21/20 at 8am.  Report to [place] Entrance "A" at 6 A.M., and check in at the Admitting office.  Call this number if you have problems the morning of surgery:  [phone]  Call [phone] if you have any questions prior to your surgery date Monday-Friday 8am-4pm    Remember:  Do not eat or drink after midnight the night before your surgery (Monday)   Take these medicines the morning of surgery with A SIP OF WATER: acetaminophen (TYLENOL) if needed finasteride (PROSCAR) fluticasone (FLONASE) nasal spray pravastatin (PRAVACHOL) predniSONE (DELTASONE) SYNTHROID timolol (TIMOPTIC) eye drops traMADol (ULTRAM) if needed   As of today, STOP taking any Aspirin (unless otherwise instructed by your surgeon) Aleve, Naproxen, Ibuprofen, Motrin, Advil, Goody's, BC's, all herbal medications, fish oil, and all vitamins.                      Do not wear jewelry.            Do not wear lotions, powders, colognes, or deodorant.            Men may shave face and neck.            Do not bring valuables to the hospital.            [facility] is not responsible for any belongings or valuables.   Contacts, glasses, dentures or bridgework may not be worn into surgery.      For patients admitted to the hospital, discharge time will be determined by your treatment team.    Oral Hygiene is also important to reduce your risk of infection.  Remember - BRUSH YOUR TEETH THE MORNING OF SURGERY WITH YOUR REGULAR TOOTHPASTE

## 2020-04-20 NOTE — Progress Notes (Signed)
Anesthesia Chart Review: [name] [name]   Case: [id number] Date/Time: 04/21/20 0745   Procedure: PLIF - L5-S1 (N/A Back)   Anesthesia type: General   Pre-op diagnosis: Spondylolisthesis   Location: [facility] OR ROOM 32 / [place] [facility]   Surgeons: [name] Larsson, MD      DISCUSSION: Patient is a 86 year old male scheduled for the above procedure.  He resides at [facility]. Last note at [facility] was on 03/23/20 by [name] Hawthorn, NP for hypertension follow-up and was awaiting for neurosurgery evaluation. He is a same day work-up.  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   He has had at least 5 surgeries at [facility] since 12/15/11, but only one that was done requiring intubation. According to 12/15/11 anesthesia record: Procedure Name: Awake intubation Grade View: Grade III Tube type: Oral Tube size: 7.5 mm Number of attempts: 1 Airway Equipment and Method: Video-laryngoscopy and Rigid stylet Placement Confirmation: ETT inserted through vocal cords under direct vision,  positive ETCO2 and breath sounds checked- equal and bilateral Dental Injury: Teeth and Oropharynx as per pre-operative assessment  Difficulty Due To: Difficulty was anticipated, Difficult Airway- due to anterior larynx, Difficult Airway- due to limited oral opening and Difficult Airway- due to reduced neck mobility Future Recommendations: Recommend- awake intubation Comments: Awake Glidescope intubation by Dr. Al Corpus.  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 Meds include  prednisone 5 mg in AM and 2.5 mg in PM and Synthroid 88 mcg. He is for COVID-19 test and anesthesia team evaluation on the day of surgery.    VS: Ht 5\' 11"  (1.803 m)   Wt 69.2 kg   BMI 21.28 kg/m    BP Readings from Last 3 Encounters:  03/23/20 (!) 158/68  02/17/20 (!) 177/80  02/11/20 (!) 161/75   Pulse Readings from Last 3 Encounters:  02/17/20 71  02/11/20 79  02/10/20 82    PROVIDERS: [name] Curry, DO is PCP [name] [name], [name] Como, MD is endocrinologist ([place]). Last visit 01/08/20. Medication compliance good.  - He is not followed by cardiology, but was evaluated by [name] Schick, MD in 09/2016 during admission for flu+, fall, dehydration, tachycardia with mildly elevated, flat trending troponins felt to be related to demand ischemia. Noted at baseline to be pretty active. Echo showed normal LVEF and wall motion.    LABS: For day of surgery. As of 02/11/20, Cr 0.9, H/H 11.1/34, PLT 275.    IMAGES: MRI L-spine 04/01/20: IMPRESSION: 1. L5 compression fracture, likely subacute, with mild inferior endplate depression. 2. L4-5 and L5-S1 predominant degenerative disease with L5-S1 chronic anterolisthesis. 3. L5-S1 severe biforaminal impingement which has progressed from 2020. 4. L4-5 bilateral subarticular recess stenosis that could affect the L5 nerve roots.   EKG: Last EKG seen is from 03/20/18 and showed SR with 1st degree AVB, RBBB, LAFB, bifascicular block, LVH--but not significantly changes since prior tracing. Had bifascicular block dating back to at least 2016.    CV: Echo 03/20/18: Study Conclusions  - Left ventricle: The cavity size was normal. There was mild  concentric hypertrophy. Systolic function was normal. The  estimated ejection fraction was in  the range of 55% to 60%. Wall  motion was normal; there were no regional wall motion  abnormalities. Doppler parameters are consistent with abnormal  left ventricular relaxation (grade 1  diastolic dysfunction).  - Aortic valve: Transvalvular velocity was within the normal range.  There was no stenosis. There was mild regurgitation.  - Mitral valve: Transvalvular velocity was within the normal range.  There was no evidence for stenosis. There was trivial  regurgitation.  - Left atrium: The atrium was moderately dilated.  - Right ventricle: The cavity size was normal. Wall thickness was  normal. Systolic function was normal.  - Atrial septum: No defect or patent foramen ovale was identified  by color flow Doppler.  - Tricuspid valve: There was trivial regurgitation.  - Pulmonary arteries: Systolic pressure was within the normal  range. PA peak pressure: 24 mm Hg (S).    Past Medical History:  Diagnosis Date  . Allergic rhinitis    Prior allergy shots 20 years  . Altered mental status    when pain is unmanaged or while on pain meds   . Anemia   . Anemia, B12 deficiency 2000  . Arthritis    Status post left total replacement 8 2011  . BPH (benign prostatic hyperplasia) 2013  . Cancer ([place])    renal cell ca and skin cancer   . CKD (chronic kidney disease)    stage 3  . Colitis 2014  . Diabetes mellitus without complication ([place])    type 2 - no meds  . Diabetic polyneuropathy associated with type 2 diabetes mellitus ([place])   . Difficult intubation 1994   surgery had to be  stopped due to injury to "throat"  . Difficult intubation 1994   no trouble since.  Required nasotracheal intubation  '02 [facility]; awake intubation 12/15/11  . GERD (gastroesophageal reflux disease)   . Glaucoma 2007   Status post left trabeculectomy 2007  . History of colon polyps    Colonoscopy 2001  . History of kidney stones   . History of renal cell carcinoma 1994   Status post right nephrectomy  . Hyperlipidemia 2003  . Hypertension   . 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 Hypothyroidism (acquired) 2000  . Nocturia   .  Osteomyelitis ([place])    right great  . Osteomyelitis ([place])    RIGHT FOOT /TOE  . Peripheral vascular disease ([place])   . Pituitary mass ([place]) 2000   S/p transphenoidal excision 05/1999 ([facility])  . Vitamin D deficiency 2009    Past Surgical History:  Procedure Laterality Date  . AMPUTATION Right 12/20/2017   Procedure: RIGHT 1ST RAY AMPUTATION;  Surgeon: [name], MD;  Location: [facility];  Service: Orthopedics;  Laterality: Right;  . AMPUTATION Right 03/23/2018   Procedure: RIGHT 2ND TOE AMPUTATION;  Surgeon: [name], MD;  Location: [place];  Service: Orthopedics;  Laterality: Right;  . BRAIN SURGERY  2000   pituatary gland removed .  Marland Kitchen CHOLECYSTECTOMY  1984  . Ectropion surgery Bilateral 2006  . EYE SURGERY  over last 6 yrs.     trabeculectomy...   . EYE SURGERY   cat ext ou  . JOINT REPLACEMENT  2011   knee left  . KNEE ARTHROSCOPY Right 02/06/2015   Procedure: RIGHT ARTHROSCOPY KNEE WITH LATERAL MENSICAL  DEBRIDEMENT;  Surgeon: Gaynelle Arabian, MD;  Location: [facility];  Service: Orthopedics;  Laterality: Right;  . Mohs procedure  for skin cancer on nose   . NEPHRECTOMY Right 1994   Renal cell  . REVERSE SHOULDER ARTHROPLASTY  12/15/2011   Procedure: REVERSE SHOULDER ARTHROPLASTY;  Surgeon: Marin Shutter, MD;  Location: [facility];  Service: Orthopedics;  Laterality: Right;  right total reverse shoulder  . TONSILLECTOMY    . TOTAL KNEE ARTHROPLASTY Right 06/29/2015   Procedure: TOTAL KNEE ARTHROPLASTY;  Surgeon: Gaynelle Arabian, MD;  Location: [facility];  Service: Orthopedics;  Laterality: Right;  . Transsphenoidal excision pituitary tumor  05/1999   A.Tommi Rumps, M.D.([facility])    MEDICATIONS: No current facility-administered medications for this encounter.   Marland Kitchen acetaminophen (TYLENOL) 500 MG tablet  . Cholecalciferol (VITAMIN D) 2000 units tablet  . Coenzyme Q10 300 MG CAPS  . Diclofenac Sodium 1.5 % SOLN  . finasteride (PROSCAR) 5 MG tablet  . Flaxseed, Linseed, (FLAXSEED OIL)  1000 MG CAPS  . mirabegron ER (MYRBETRIQ) 25 MG TB24 tablet  . Multiple Vitamins-Minerals (MULTIVITAMIN & MINERAL PO)  . polyethylene glycol (MIRALAX / GLYCOLAX) 17 g packet  . pravastatin (PRAVACHOL) 80 MG tablet  . predniSONE (DELTASONE) 5 MG tablet  . SYNTHROID 88 MCG tablet  . testosterone cypionate (DEPOTESTOSTERONE CYPIONATE) 200 MG/ML injection  . timolol (TIMOPTIC) 0.5 % ophthalmic solution  . traMADol (ULTRAM) 50 MG tablet  . vitamin B-12 (CYANOCOBALAMIN) 500 MCG tablet  . erythromycin ophthalmic ointment  . Flaxseed, Linseed, (EQL FLAXSEED OIL) 1200 MG CAPS  . fluticasone (FLONASE) 50 MCG/ACT nasal spray  . NON FORMULARY  . Omega-3 Fatty Acids (OMEGA-3 FISH OIL PO)     Myra Gianotti, PA-C Surgical Short Stay/Anesthesiology [facility] Phone [phone] [facility] Phone [phone] 04/20/2020 3:45 PM

## 2020-04-21 ENCOUNTER — Inpatient Hospital Stay (HOSPITAL_COMMUNITY)
Admission: RE | Admit: 2020-04-21 | Discharge: 2020-04-23 | DRG: 454 | Disposition: A | Discharge status: Skilled Nursing Facility | Payer: PPO | Attending: Neurosurgery | Admitting: Neurosurgery

## 2020-04-21 ENCOUNTER — Encounter (HOSPITAL_COMMUNITY): Payer: Self-pay | Admitting: Neurosurgery

## 2020-04-21 ENCOUNTER — Inpatient Hospital Stay (HOSPITAL_COMMUNITY): Payer: PPO | Admitting: Certified Registered"

## 2020-04-21 ENCOUNTER — Other Ambulatory Visit: Payer: Self-pay

## 2020-04-21 ENCOUNTER — Encounter (HOSPITAL_COMMUNITY)
Admission: RE | Disposition: A | Discharge status: Skilled Nursing Facility | Payer: Self-pay | Source: Home / Self Care | Attending: Neurosurgery

## 2020-04-21 ENCOUNTER — Inpatient Hospital Stay (HOSPITAL_COMMUNITY): Payer: PPO

## 2020-04-21 DIAGNOSIS — Z79890 Hormone replacement therapy (postmenopausal): Secondary | ICD-10-CM

## 2020-04-21 DIAGNOSIS — K219 Gastro-esophageal reflux disease without esophagitis: Secondary | ICD-10-CM | POA: Diagnosis not present

## 2020-04-21 DIAGNOSIS — E1151 Type 2 diabetes mellitus with diabetic peripheral angiopathy without gangrene: Secondary | ICD-10-CM | POA: Diagnosis present

## 2020-04-21 DIAGNOSIS — M4310 Spondylolisthesis, site unspecified: Secondary | ICD-10-CM | POA: Diagnosis present

## 2020-04-21 DIAGNOSIS — M4326 Fusion of spine, lumbar region: Secondary | ICD-10-CM | POA: Diagnosis not present

## 2020-04-21 DIAGNOSIS — Z87442 Personal history of urinary calculi: Secondary | ICD-10-CM | POA: Diagnosis not present

## 2020-04-21 DIAGNOSIS — Z801 Family history of malignant neoplasm of trachea, bronchus and lung: Secondary | ICD-10-CM

## 2020-04-21 DIAGNOSIS — Z79891 Long term (current) use of opiate analgesic: Secondary | ICD-10-CM

## 2020-04-21 DIAGNOSIS — E1142 Type 2 diabetes mellitus with diabetic polyneuropathy: Secondary | ICD-10-CM | POA: Diagnosis not present

## 2020-04-21 DIAGNOSIS — N1832 Chronic kidney disease, stage 3b: Secondary | ICD-10-CM | POA: Diagnosis present

## 2020-04-21 DIAGNOSIS — Z8719 Personal history of other diseases of the digestive system: Secondary | ICD-10-CM | POA: Diagnosis not present

## 2020-04-21 DIAGNOSIS — I129 Hypertensive chronic kidney disease with stage 1 through stage 4 chronic kidney disease, or unspecified chronic kidney disease: Secondary | ICD-10-CM | POA: Diagnosis not present

## 2020-04-21 DIAGNOSIS — E039 Hypothyroidism, unspecified: Secondary | ICD-10-CM | POA: Diagnosis present

## 2020-04-21 DIAGNOSIS — E785 Hyperlipidemia, unspecified: Secondary | ICD-10-CM | POA: Diagnosis present

## 2020-04-21 DIAGNOSIS — Z9049 Acquired absence of other specified parts of digestive tract: Secondary | ICD-10-CM | POA: Diagnosis not present

## 2020-04-21 DIAGNOSIS — M4317 Spondylolisthesis, lumbosacral region: Principal | ICD-10-CM | POA: Diagnosis present

## 2020-04-21 DIAGNOSIS — M5410 Radiculopathy, site unspecified: Secondary | ICD-10-CM | POA: Diagnosis not present

## 2020-04-21 DIAGNOSIS — M5417 Radiculopathy, lumbosacral region: Secondary | ICD-10-CM | POA: Diagnosis not present

## 2020-04-21 DIAGNOSIS — Z905 Acquired absence of kidney: Secondary | ICD-10-CM

## 2020-04-21 DIAGNOSIS — M4807 Spinal stenosis, lumbosacral region: Secondary | ICD-10-CM | POA: Diagnosis present

## 2020-04-21 DIAGNOSIS — M5117 Intervertebral disc disorders with radiculopathy, lumbosacral region: Secondary | ICD-10-CM | POA: Diagnosis not present

## 2020-04-21 DIAGNOSIS — Z89421 Acquired absence of other right toe(s): Secondary | ICD-10-CM

## 2020-04-21 DIAGNOSIS — E230 Hypopituitarism: Secondary | ICD-10-CM | POA: Diagnosis not present

## 2020-04-21 DIAGNOSIS — Z888 Allergy status to other drugs, medicaments and biological substances status: Secondary | ICD-10-CM

## 2020-04-21 DIAGNOSIS — N400 Enlarged prostate without lower urinary tract symptoms: Secondary | ICD-10-CM | POA: Diagnosis not present

## 2020-04-21 DIAGNOSIS — Z79899 Other long term (current) drug therapy: Secondary | ICD-10-CM

## 2020-04-21 DIAGNOSIS — Z981 Arthrodesis status: Secondary | ICD-10-CM | POA: Diagnosis not present

## 2020-04-21 DIAGNOSIS — E538 Deficiency of other specified B group vitamins: Secondary | ICD-10-CM | POA: Diagnosis present

## 2020-04-21 DIAGNOSIS — Z85528 Personal history of other malignant neoplasm of kidney: Secondary | ICD-10-CM | POA: Diagnosis not present

## 2020-04-21 DIAGNOSIS — Z85828 Personal history of other malignant neoplasm of skin: Secondary | ICD-10-CM

## 2020-04-21 DIAGNOSIS — D631 Anemia in chronic kidney disease: Secondary | ICD-10-CM | POA: Diagnosis present

## 2020-04-21 DIAGNOSIS — Z20822 Contact with and (suspected) exposure to covid-19: Secondary | ICD-10-CM | POA: Diagnosis not present

## 2020-04-21 DIAGNOSIS — Z87891 Personal history of nicotine dependence: Secondary | ICD-10-CM

## 2020-04-21 DIAGNOSIS — Z96651 Presence of right artificial knee joint: Secondary | ICD-10-CM | POA: Diagnosis not present

## 2020-04-21 DIAGNOSIS — Z7952 Long term (current) use of systemic steroids: Secondary | ICD-10-CM

## 2020-04-21 DIAGNOSIS — Z419 Encounter for procedure for purposes other than remedying health state, unspecified: Secondary | ICD-10-CM

## 2020-04-21 DIAGNOSIS — E1122 Type 2 diabetes mellitus with diabetic chronic kidney disease: Secondary | ICD-10-CM | POA: Diagnosis not present

## 2020-04-21 DIAGNOSIS — Z885 Allergy status to narcotic agent status: Secondary | ICD-10-CM

## 2020-04-21 HISTORY — DX: Peripheral vascular disease, unspecified: I73.9

## 2020-04-21 HISTORY — DX: Chronic kidney disease, unspecified: N18.9

## 2020-04-21 HISTORY — DX: Type 2 diabetes mellitus with diabetic polyneuropathy: E11.42

## 2020-04-21 HISTORY — DX: Altered mental status, unspecified: R41.82

## 2020-04-21 HISTORY — DX: Type 2 diabetes mellitus without complications: E11.9

## 2020-04-21 LAB — GLUCOSE, CAPILLARY
Glucose-Capillary: 155 mg/dL — ABNORMAL HIGH (ref 70–99)
Glucose-Capillary: 174 mg/dL — ABNORMAL HIGH (ref 70–99)

## 2020-04-21 LAB — CBC WITH DIFFERENTIAL/PLATELET
Abs Immature Granulocytes: 0.53 10*3/uL — ABNORMAL HIGH (ref 0.00–0.07)
Basophils Absolute: 0.1 10*3/uL (ref 0.0–0.1)
Basophils Relative: 1 %
Eosinophils Absolute: 0.3 10*3/uL (ref 0.0–0.5)
Eosinophils Relative: 3 %
HCT: 42.5 % (ref 39.0–52.0)
Hemoglobin: 13.4 g/dL (ref 13.0–17.0)
Immature Granulocytes: 4 %
Lymphocytes Relative: 19 %
Lymphs Abs: 2.5 10*3/uL (ref 0.7–4.0)
MCH: 32.2 pg (ref 26.0–34.0)
MCHC: 31.5 g/dL (ref 30.0–36.0)
MCV: 102.2 fL — ABNORMAL HIGH (ref 80.0–100.0)
Monocytes Absolute: 1.4 10*3/uL — ABNORMAL HIGH (ref 0.1–1.0)
Monocytes Relative: 10 %
Neutro Abs: 8.6 10*3/uL — ABNORMAL HIGH (ref 1.7–7.7)
Neutrophils Relative %: 63 %
Platelets: 289 10*3/uL (ref 150–400)
RBC: 4.16 MIL/uL — ABNORMAL LOW (ref 4.22–5.81)
RDW: 17.9 % — ABNORMAL HIGH (ref 11.5–15.5)
WBC: 13.4 10*3/uL — ABNORMAL HIGH (ref 4.0–10.5)
nRBC: 0.7 % — ABNORMAL HIGH (ref 0.0–0.2)

## 2020-04-21 LAB — TYPE AND SCREEN
ABO/RH(D): O POS
Antibody Screen: NEGATIVE

## 2020-04-21 LAB — SARS CORONAVIRUS 2 BY RT PCR (HOSPITAL ORDER, PERFORMED IN ~~LOC~~ HOSPITAL LAB): SARS Coronavirus 2: NEGATIVE

## 2020-04-21 SURGERY — POSTERIOR LUMBAR FUSION 1 LEVEL
Anesthesia: General | Site: Back

## 2020-04-21 MED ORDER — PROPOFOL 10 MG/ML IV BOLUS
INTRAVENOUS | Status: DC | PRN
  Administered 2020-04-21: 60 mg via INTRAVENOUS

## 2020-04-21 MED ORDER — BUPIVACAINE HCL (PF) 0.25 % IJ SOLN
INTRAMUSCULAR | Status: AC
  Filled 2020-04-21: qty 30

## 2020-04-21 MED ORDER — ACETAMINOPHEN 325 MG PO TABS: 650.0000 mg | ORAL_TABLET | ORAL | Status: DC | PRN

## 2020-04-21 MED ORDER — 0.9 % SODIUM CHLORIDE (POUR BTL) OPTIME
TOPICAL | Status: DC | PRN
  Administered 2020-04-21: 1000 mL

## 2020-04-21 MED ORDER — SUGAMMADEX SODIUM 200 MG/2ML IV SOLN
INTRAVENOUS | Status: DC | PRN
  Administered 2020-04-21: 200 mg via INTRAVENOUS

## 2020-04-21 MED ORDER — EPHEDRINE SULFATE-NACL 50-0.9 MG/10ML-% IV SOSY
PREFILLED_SYRINGE | INTRAVENOUS | Status: DC | PRN
  Administered 2020-04-21 (×2): 5 mg via INTRAVENOUS
  Administered 2020-04-21: 10 mg via INTRAVENOUS
  Administered 2020-04-21 (×3): 5 mg via INTRAVENOUS

## 2020-04-21 MED ORDER — ACETAMINOPHEN 500 MG PO TABS
ORAL_TABLET | ORAL | Status: AC
  Filled 2020-04-21: qty 2

## 2020-04-21 MED ORDER — PHENYLEPHRINE 40 MCG/ML (10ML) SYRINGE FOR IV PUSH (FOR BLOOD PRESSURE SUPPORT)
PREFILLED_SYRINGE | INTRAVENOUS | Status: AC
  Filled 2020-04-21: qty 10

## 2020-04-21 MED ORDER — FLEET ENEMA 7-19 GM/118ML RE ENEM: 1.0000 | ENEMA | Freq: Once | RECTAL | Status: DC | PRN

## 2020-04-21 MED ORDER — ONDANSETRON HCL 4 MG/2ML IJ SOLN
INTRAMUSCULAR | Status: DC | PRN
  Administered 2020-04-21: 4 mg via INTRAVENOUS

## 2020-04-21 MED ORDER — DIAZEPAM 5 MG PO TABS: 5.0000 mg | ORAL_TABLET | Freq: Four times a day (QID) | ORAL | Status: DC | PRN

## 2020-04-21 MED ORDER — LEVOTHYROXINE SODIUM 88 MCG PO TABS
88.0000 ug | ORAL_TABLET | Freq: Every day | ORAL | Status: DC
  Administered 2020-04-22 – 2020-04-23 (×2): 88 ug via ORAL
  Filled 2020-04-21 (×2): qty 1

## 2020-04-21 MED ORDER — FENTANYL CITRATE (PF) 250 MCG/5ML IJ SOLN
INTRAMUSCULAR | Status: AC
  Filled 2020-04-21: qty 5

## 2020-04-21 MED ORDER — HYDROMORPHONE HCL 1 MG/ML IJ SOLN: 1.0000 mg | INTRAMUSCULAR | Status: DC | PRN

## 2020-04-21 MED ORDER — FINASTERIDE 5 MG PO TABS
5.0000 mg | ORAL_TABLET | Freq: Every day | ORAL | Status: DC
  Administered 2020-04-21 – 2020-04-23 (×3): 5 mg via ORAL
  Filled 2020-04-21 (×3): qty 1

## 2020-04-21 MED ORDER — DEXAMETHASONE SODIUM PHOSPHATE 10 MG/ML IJ SOLN
INTRAMUSCULAR | Status: AC
  Filled 2020-04-21: qty 1

## 2020-04-21 MED ORDER — SODIUM CHLORIDE 0.9% FLUSH
3.0000 mL | Freq: Two times a day (BID) | INTRAVENOUS | Status: DC
  Administered 2020-04-22 (×2): 3 mL via INTRAVENOUS

## 2020-04-21 MED ORDER — LIDOCAINE 2% (20 MG/ML) 5 ML SYRINGE
INTRAMUSCULAR | Status: DC | PRN
  Administered 2020-04-21: 80 mg via INTRAVENOUS

## 2020-04-21 MED ORDER — HYDROCODONE-ACETAMINOPHEN 10-325 MG PO TABS: 1.0000 | ORAL_TABLET | ORAL | Status: DC | PRN

## 2020-04-21 MED ORDER — LIDOCAINE 2% (20 MG/ML) 5 ML SYRINGE
INTRAMUSCULAR | Status: AC
  Filled 2020-04-21: qty 5

## 2020-04-21 MED ORDER — MENTHOL 3 MG MT LOZG: 1.0000 | LOZENGE | OROMUCOSAL | Status: DC | PRN

## 2020-04-21 MED ORDER — PREDNISONE 2.5 MG PO TABS
5.0000 mg | ORAL_TABLET | Freq: Every day | ORAL | Status: DC
  Administered 2020-04-22 – 2020-04-23 (×2): 5 mg via ORAL
  Filled 2020-04-21 (×2): qty 2

## 2020-04-21 MED ORDER — PRAVASTATIN SODIUM 40 MG PO TABS
80.0000 mg | ORAL_TABLET | ORAL | Status: DC
  Administered 2020-04-21 – 2020-04-23 (×2): 80 mg via ORAL
  Filled 2020-04-21 (×2): qty 2

## 2020-04-21 MED ORDER — BISACODYL 10 MG RE SUPP: 10.0000 mg | Freq: Every day | RECTAL | Status: DC | PRN

## 2020-04-21 MED ORDER — DEXAMETHASONE SODIUM PHOSPHATE 10 MG/ML IJ SOLN
10.0000 mg | Freq: Once | INTRAMUSCULAR | Status: DC
  Filled 2020-04-21: qty 1

## 2020-04-21 MED ORDER — PROPOFOL 10 MG/ML IV BOLUS
INTRAVENOUS | Status: AC
  Filled 2020-04-21: qty 20

## 2020-04-21 MED ORDER — ONDANSETRON HCL 4 MG/2ML IJ SOLN: 4.0000 mg | Freq: Four times a day (QID) | INTRAMUSCULAR | Status: DC | PRN

## 2020-04-21 MED ORDER — CHLORHEXIDINE GLUCONATE 0.12 % MT SOLN: 15.0000 mL | Freq: Once | OROMUCOSAL | Status: AC

## 2020-04-21 MED ORDER — GLYCOPYRROLATE 0.2 MG/ML IJ SOLN
INTRAMUSCULAR | Status: DC | PRN
  Administered 2020-04-21: .2 mg via INTRAVENOUS

## 2020-04-21 MED ORDER — LACTATED RINGERS IV SOLN: INTRAVENOUS | Status: DC

## 2020-04-21 MED ORDER — POLYETHYLENE GLYCOL 3350 17 G PO PACK
17.0000 g | PACK | Freq: Every day | ORAL | Status: DC
  Administered 2020-04-23: 17 g via ORAL

## 2020-04-21 MED ORDER — ACETAMINOPHEN 500 MG PO TABS: 1000.0000 mg | ORAL_TABLET | Freq: Once | ORAL | Status: DC

## 2020-04-21 MED ORDER — POLYETHYLENE GLYCOL 3350 17 G PO PACK: 17.0000 g | PACK | Freq: Every day | ORAL | Status: DC | PRN

## 2020-04-21 MED ORDER — SODIUM CHLORIDE 0.9% FLUSH: 3.0000 mL | INTRAVENOUS | Status: DC | PRN

## 2020-04-21 MED ORDER — HYDROCORTISONE NA SUCCINATE PF 100 MG IJ SOLR
50.0000 mg | Freq: Four times a day (QID) | INTRAMUSCULAR | Status: AC
  Administered 2020-04-21 – 2020-04-22 (×4): 50 mg via INTRAVENOUS
  Filled 2020-04-21 (×4): qty 1

## 2020-04-21 MED ORDER — ACETAMINOPHEN 500 MG PO TABS
500.0000 mg | ORAL_TABLET | Freq: Four times a day (QID) | ORAL | Status: DC
  Administered 2020-04-21 – 2020-04-23 (×4): 500 mg via ORAL
  Filled 2020-04-21: qty 1
  Filled 2020-04-21: qty 2
  Filled 2020-04-21 (×4): qty 1

## 2020-04-21 MED ORDER — SODIUM CHLORIDE 0.9 % IV SOLN
250.0000 mL | INTRAVENOUS | Status: DC
  Administered 2020-04-21: 250 mL via INTRAVENOUS

## 2020-04-21 MED ORDER — ONDANSETRON HCL 4 MG/2ML IJ SOLN
INTRAMUSCULAR | Status: AC
  Filled 2020-04-21: qty 2

## 2020-04-21 MED ORDER — COENZYME Q10 300 MG PO CAPS: 300.0000 mg | ORAL_CAPSULE | Freq: Every day | ORAL | Status: DC

## 2020-04-21 MED ORDER — SODIUM CHLORIDE 0.9 % IV SOLN
INTRAVENOUS | Status: DC | PRN
  Administered 2020-04-21: 500 mL

## 2020-04-21 MED ORDER — ONDANSETRON HCL 4 MG PO TABS: 4.0000 mg | ORAL_TABLET | Freq: Four times a day (QID) | ORAL | Status: DC | PRN

## 2020-04-21 MED ORDER — PHENOL 1.4 % MT LIQD: 1.0000 | OROMUCOSAL | Status: DC | PRN

## 2020-04-21 MED ORDER — PREDNISONE 2.5 MG PO TABS
2.5000 mg | ORAL_TABLET | Freq: Every day | ORAL | Status: DC
  Administered 2020-04-21 – 2020-04-22 (×2): 2.5 mg via ORAL
  Filled 2020-04-21 (×2): qty 1

## 2020-04-21 MED ORDER — CHLORHEXIDINE GLUCONATE 0.12 % MT SOLN
OROMUCOSAL | Status: AC
  Administered 2020-04-21: 15 mL via OROMUCOSAL
  Filled 2020-04-21: qty 15

## 2020-04-21 MED ORDER — FENTANYL CITRATE (PF) 100 MCG/2ML IJ SOLN
25.0000 ug | INTRAMUSCULAR | Status: DC | PRN
  Administered 2020-04-21: 12.5 ug via INTRAVENOUS

## 2020-04-21 MED ORDER — BUPIVACAINE HCL (PF) 0.25 % IJ SOLN
INTRAMUSCULAR | Status: DC | PRN
  Administered 2020-04-21: 10 mL

## 2020-04-21 MED ORDER — ROCURONIUM BROMIDE 10 MG/ML (PF) SYRINGE
PREFILLED_SYRINGE | INTRAVENOUS | Status: AC
  Filled 2020-04-21: qty 10

## 2020-04-21 MED ORDER — CHLORHEXIDINE GLUCONATE CLOTH 2 % EX PADS: 6.0000 | MEDICATED_PAD | Freq: Once | CUTANEOUS | Status: DC

## 2020-04-21 MED ORDER — ADULT MULTIVITAMIN LIQUID CH
Freq: Every day | ORAL | Status: DC
  Administered 2020-04-22: 15 mL via ORAL
  Filled 2020-04-21 (×3): qty 15

## 2020-04-21 MED ORDER — THROMBIN 20000 UNITS EX SOLR
CUTANEOUS | Status: AC
  Filled 2020-04-21: qty 20000

## 2020-04-21 MED ORDER — VITAMIN B-12 1000 MCG PO TABS
500.0000 ug | ORAL_TABLET | Freq: Every day | ORAL | Status: DC
  Administered 2020-04-21 – 2020-04-23 (×3): 500 ug via ORAL
  Filled 2020-04-21 (×3): qty 1

## 2020-04-21 MED ORDER — CEFAZOLIN SODIUM-DEXTROSE 1-4 GM/50ML-% IV SOLN
1.0000 g | Freq: Three times a day (TID) | INTRAVENOUS | Status: AC
  Administered 2020-04-21 (×2): 1 g via INTRAVENOUS
  Filled 2020-04-21 (×2): qty 50

## 2020-04-21 MED ORDER — SUCCINYLCHOLINE CHLORIDE 20 MG/ML IJ SOLN
INTRAMUSCULAR | Status: DC | PRN
  Administered 2020-04-21: 100 mg via INTRAVENOUS

## 2020-04-21 MED ORDER — TRAMADOL HCL 50 MG PO TABS
25.0000 mg | ORAL_TABLET | Freq: Four times a day (QID) | ORAL | Status: DC | PRN
  Administered 2020-04-21 – 2020-04-23 (×6): 50 mg via ORAL
  Filled 2020-04-21 (×6): qty 1

## 2020-04-21 MED ORDER — HYDROCORTISONE NA SUCCINATE PF 100 MG IJ SOLR
INTRAMUSCULAR | Status: DC | PRN
  Administered 2020-04-21: 100 mg via INTRAVENOUS

## 2020-04-21 MED ORDER — OXYCODONE HCL 5 MG PO TABS: 5.0000 mg | ORAL_TABLET | ORAL | Status: DC | PRN

## 2020-04-21 MED ORDER — TIMOLOL MALEATE 0.5 % OP SOLN
1.0000 [drp] | Freq: Every day | OPHTHALMIC | Status: DC
  Administered 2020-04-21 – 2020-04-23 (×3): 1 [drp] via OPHTHALMIC
  Filled 2020-04-21: qty 5

## 2020-04-21 MED ORDER — FLUTICASONE PROPIONATE 50 MCG/ACT NA SUSP
2.0000 | Freq: Every day | NASAL | Status: DC
  Administered 2020-04-22 – 2020-04-23 (×2): 2 via NASAL
  Filled 2020-04-21: qty 16

## 2020-04-21 MED ORDER — VANCOMYCIN HCL 1000 MG IV SOLR
INTRAVENOUS | Status: AC
  Filled 2020-04-21: qty 1000

## 2020-04-21 MED ORDER — FENTANYL CITRATE (PF) 250 MCG/5ML IJ SOLN
INTRAMUSCULAR | Status: DC | PRN
  Administered 2020-04-21 (×2): 25 ug via INTRAVENOUS
  Administered 2020-04-21: 50 ug via INTRAVENOUS
  Administered 2020-04-21: 25 ug via INTRAVENOUS

## 2020-04-21 MED ORDER — MIRABEGRON ER 25 MG PO TB24
25.0000 mg | ORAL_TABLET | ORAL | Status: DC
  Administered 2020-04-23: 25 mg via ORAL
  Filled 2020-04-21 (×2): qty 1

## 2020-04-21 MED ORDER — ORAL CARE MOUTH RINSE: 15.0000 mL | Freq: Once | OROMUCOSAL | Status: AC

## 2020-04-21 MED ORDER — ACETAMINOPHEN 650 MG RE SUPP: 650.0000 mg | RECTAL | Status: DC | PRN

## 2020-04-21 MED ORDER — VANCOMYCIN HCL 1000 MG IV SOLR
INTRAVENOUS | Status: DC | PRN
  Administered 2020-04-21: 1000 mg

## 2020-04-21 MED ORDER — KETAMINE HCL 50 MG/5ML IJ SOSY
PREFILLED_SYRINGE | INTRAMUSCULAR | Status: AC
  Filled 2020-04-21: qty 10

## 2020-04-21 MED ORDER — FLAXSEED OIL 1000 MG PO CAPS: 1000.0000 mg | ORAL_CAPSULE | Freq: Every day | ORAL | Status: DC

## 2020-04-21 MED ORDER — PHENYLEPHRINE 40 MCG/ML (10ML) SYRINGE FOR IV PUSH (FOR BLOOD PRESSURE SUPPORT)
PREFILLED_SYRINGE | INTRAVENOUS | Status: DC | PRN
  Administered 2020-04-21 (×2): 80 ug via INTRAVENOUS

## 2020-04-21 MED ORDER — CEFAZOLIN SODIUM-DEXTROSE 2-4 GM/100ML-% IV SOLN
2.0000 g | INTRAVENOUS | Status: AC
  Administered 2020-04-21: 2 g via INTRAVENOUS
  Filled 2020-04-21: qty 100

## 2020-04-21 MED ORDER — ALBUMIN HUMAN 5 % IV SOLN: INTRAVENOUS | Status: DC | PRN

## 2020-04-21 MED ORDER — FENTANYL CITRATE (PF) 100 MCG/2ML IJ SOLN
INTRAMUSCULAR | Status: AC
  Filled 2020-04-21: qty 2

## 2020-04-21 MED ORDER — THROMBIN 20000 UNITS EX SOLR
CUTANEOUS | Status: DC | PRN
  Administered 2020-04-21: 20 mL

## 2020-04-21 MED ORDER — ROCURONIUM BROMIDE 10 MG/ML (PF) SYRINGE
PREFILLED_SYRINGE | INTRAVENOUS | Status: DC | PRN
  Administered 2020-04-21: 20 mg via INTRAVENOUS
  Administered 2020-04-21: 50 mg via INTRAVENOUS
  Administered 2020-04-21: 20 mg via INTRAVENOUS
  Administered 2020-04-21: 30 mg via INTRAVENOUS

## 2020-04-21 MED ORDER — VITAMIN D 25 MCG (1000 UNIT) PO TABS
2000.0000 [IU] | ORAL_TABLET | Freq: Every day | ORAL | Status: DC
  Administered 2020-04-22 – 2020-04-23 (×2): 2000 [IU] via ORAL
  Filled 2020-04-21 (×4): qty 2

## 2020-04-21 MED ORDER — PHENYLEPHRINE HCL-NACL 10-0.9 MG/250ML-% IV SOLN
INTRAVENOUS | Status: DC | PRN
  Administered 2020-04-21: 30 ug/min via INTRAVENOUS

## 2020-04-21 SURGICAL SUPPLY — 64 items
ADH SKN CLS APL DERMABOND .7 (GAUZE/BANDAGES/DRESSINGS) ×1
APL SKNCLS STERI-STRIP NONHPOA (GAUZE/BANDAGES/DRESSINGS) ×1
BAG DECANTER FOR FLEXI CONT (MISCELLANEOUS) ×3 IMPLANT
BENZOIN TINCTURE PRP APPL 2/3 (GAUZE/BANDAGES/DRESSINGS) ×3 IMPLANT
BLADE CLIPPER SURG (BLADE) IMPLANT
BUR CUTTER 7.0 ROUND (BURR) IMPLANT
BUR MATCHSTICK NEURO 3.0 LAGG (BURR) ×3 IMPLANT
CANISTER SUCT 3000ML PPV (MISCELLANEOUS) ×3 IMPLANT
CAP LCK SPNE (Orthopedic Implant) ×4 IMPLANT
CAP LOCK SPINE RADIUS (Orthopedic Implant) IMPLANT
CAP LOCKING (Orthopedic Implant) ×12 IMPLANT
CARTRIDGE OIL MAESTRO DRILL (MISCELLANEOUS) ×1 IMPLANT
CLOSURE WOUND 1/2 X4 (GAUZE/BANDAGES/DRESSINGS) ×2
CNTNR URN SCR LID CUP LEK RST (MISCELLANEOUS) ×1 IMPLANT
CONT SPEC 4OZ STRL OR WHT (MISCELLANEOUS) ×3
COVER BACK TABLE 60X90IN (DRAPES) ×3 IMPLANT
COVER WAND RF STERILE (DRAPES) ×3 IMPLANT
DECANTER SPIKE VIAL GLASS SM (MISCELLANEOUS) ×3 IMPLANT
DERMABOND ADVANCED (GAUZE/BANDAGES/DRESSINGS) ×2
DERMABOND ADVANCED .7 DNX12 (GAUZE/BANDAGES/DRESSINGS) ×1 IMPLANT
DEVICE INTERBODY ELEVATE 10X23 (Cage) ×4 IMPLANT
DIFFUSER DRILL AIR PNEUMATIC (MISCELLANEOUS) ×3 IMPLANT
DRAPE C-ARM 42X72 X-RAY (DRAPES) ×6 IMPLANT
DRAPE HALF SHEET 40X57 (DRAPES) IMPLANT
DRAPE LAPAROTOMY 100X72X124 (DRAPES) ×3 IMPLANT
DRAPE SURG 17X23 STRL (DRAPES) ×12 IMPLANT
DRSG OPSITE POSTOP 4X6 (GAUZE/BANDAGES/DRESSINGS) ×3 IMPLANT
DURAPREP 26ML APPLICATOR (WOUND CARE) ×3 IMPLANT
ELECT REM PT RETURN 9FT ADLT (ELECTROSURGICAL) ×3
ELECTRODE REM PT RTRN 9FT ADLT (ELECTROSURGICAL) ×1 IMPLANT
EVACUATOR 1/8 PVC DRAIN (DRAIN) IMPLANT
GAUZE 4X4 16PLY RFD (DISPOSABLE) IMPLANT
GAUZE SPONGE 4X4 12PLY STRL (GAUZE/BANDAGES/DRESSINGS) IMPLANT
GLOVE BIO SURGEON STRL SZ 6.5 (GLOVE) ×2 IMPLANT
GLOVE BIO SURGEONS STRL SZ 6.5 (GLOVE) ×1
GLOVE BIOGEL PI IND STRL 6.5 (GLOVE) ×1 IMPLANT
GLOVE BIOGEL PI INDICATOR 6.5 (GLOVE) ×2
GLOVE ECLIPSE 9.0 STRL (GLOVE) ×6 IMPLANT
GLOVE EXAM NITRILE XL STR (GLOVE) IMPLANT
GOWN STRL REUS W/ TWL LRG LVL3 (GOWN DISPOSABLE) IMPLANT
GOWN STRL REUS W/ TWL XL LVL3 (GOWN DISPOSABLE) ×2 IMPLANT
GOWN STRL REUS W/TWL 2XL LVL3 (GOWN DISPOSABLE) IMPLANT
GOWN STRL REUS W/TWL LRG LVL3 (GOWN DISPOSABLE)
GOWN STRL REUS W/TWL XL LVL3 (GOWN DISPOSABLE) ×6
KIT BASIN OR (CUSTOM PROCEDURE TRAY) ×3 IMPLANT
KIT TURNOVER KIT B (KITS) ×3 IMPLANT
MILL MEDIUM DISP (BLADE) ×3 IMPLANT
NEEDLE HYPO 22GX1.5 SAFETY (NEEDLE) ×3 IMPLANT
NS IRRIG 1000ML POUR BTL (IV SOLUTION) ×3 IMPLANT
OIL CARTRIDGE MAESTRO DRILL (MISCELLANEOUS) ×3
PACK LAMINECTOMY NEURO (CUSTOM PROCEDURE TRAY) ×3 IMPLANT
ROD RADIUS 35MM (Rod) ×4 IMPLANT
SCREW 5.75X40M (Screw) ×4 IMPLANT
SCREW 5.75X45MM (Screw) ×4 IMPLANT
SPONGE SURGIFOAM ABS GEL 100 (HEMOSTASIS) ×3 IMPLANT
STRIP CLOSURE SKIN 1/2X4 (GAUZE/BANDAGES/DRESSINGS) ×4 IMPLANT
SUT VIC AB 0 CT1 18XCR BRD8 (SUTURE) ×2 IMPLANT
SUT VIC AB 0 CT1 8-18 (SUTURE) ×6
SUT VIC AB 2-0 CT1 18 (SUTURE) ×3 IMPLANT
SUT VIC AB 3-0 SH 8-18 (SUTURE) ×6 IMPLANT
TOWEL GREEN STERILE (TOWEL DISPOSABLE) ×3 IMPLANT
TOWEL GREEN STERILE FF (TOWEL DISPOSABLE) ×3 IMPLANT
TRAY FOLEY MTR SLVR 16FR STAT (SET/KITS/TRAYS/PACK) ×3 IMPLANT
WATER STERILE IRR 1000ML POUR (IV SOLUTION) ×3 IMPLANT

## 2020-04-21 NOTE — Brief Op Note (Signed)
04/21/2020  10:57 AM  PATIENT:  [name] [name].  86 y.o. male  PRE-OPERATIVE DIAGNOSIS:  Spondylolisthesis  POST-OPERATIVE DIAGNOSIS:  Spondylolisthesis  PROCEDURE:  Procedure(s) with comments: Posterior Lumbar Interbody Fusion - Lumbar Five-Sacral One (N/A) - Posterior Lumbar Interbody Fusion - Lumbar Five-Sacral One  SURGEON:  Surgeon(s) and Role:    * Earnie Larsson, MD - Primary  PHYSICIAN ASSISTANT:   ASSISTANTSMearl Latin   ANESTHESIA:   general  EBL:  150 mL   BLOOD ADMINISTERED:none  DRAINS: none   LOCAL MEDICATIONS USED:  MARCAINE     SPECIMEN:  No Specimen  DISPOSITION OF SPECIMEN:  N/A  COUNTS:  YES  TOURNIQUET:  * No tourniquets in log *  DICTATION: .Dragon Dictation  PLAN OF CARE: Admit to inpatient   PATIENT DISPOSITION:  PACU - hemodynamically stable.   Delay start of Pharmacological VTE agent (>24hrs) due to surgical blood loss or risk of bleeding: yes

## 2020-04-21 NOTE — H&P (Signed)
[name] [name]. is an 86 y.o. male.   Chief Complaint: Back pain HPI: 86 year old male presents with severe back and bilateral lower extremity pain worsened with attempts sitting upright or attempts at standing.  Symptoms have been present for more than 6 months.  They have failed all efforts of conservative management including injections and pain management.  The patient is miserable with his pain.  He describes the pain as being unlivable.  The patient is reasonably comfortable when lying flat.  When he sits upright or attempts to stand he has shooting pains into his buttocks and posterior aspects of both lower extremities.  Work-up demonstrates evidence of an unstable grade 2 L5/S1 degenerative spondylolisthesis with severe foraminal stenosis.  The patient presents now for L5/S1 decompression and fusion in hopes of improving his symptoms.  Past Medical History:  Diagnosis Date  . Allergic rhinitis    Prior allergy shots 20 years  . Altered mental status    when pain is unmanaged or while on pain meds   . Anemia   . Anemia, B12 deficiency 2000  . Arthritis    Status post left total replacement 8 2011  . BPH (benign prostatic hyperplasia) 2013  . Cancer ([place])    renal cell ca and skin cancer   . CKD (chronic kidney disease)    stage 3  . Colitis 2014  . Diabetes mellitus without complication ([place])    type 2 - no meds  . Diabetic polyneuropathy associated with type 2 diabetes mellitus ([place])   . Difficult intubation 1994   surgery had to be  stopped due to injury to "throat"  . Difficult intubation 1994   no trouble since.  Required nasotracheal intubation  '02 [facility]; awake intubation 12/15/11  . GERD (gastroesophageal reflux disease)   . Glaucoma 2007   Status post left trabeculectomy 2007  . History of colon polyps    Colonoscopy 2001  . History of kidney stones   . History of renal cell carcinoma 1994   Status post right nephrectomy  . Hyperlipidemia 2003  . Hypertension    . 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 Hypothyroidism (acquired) 2000  . Nocturia   . Osteomyelitis ([place])    right great  . Osteomyelitis ([place])    RIGHT FOOT /TOE  . Peripheral vascular disease ([place])   . Pituitary mass ([place]) 2000   S/p transphenoidal excision 05/1999 ([facility])  . Vitamin D deficiency 2009    Past Surgical History:  Procedure Laterality Date  . AMPUTATION Right 12/20/2017   Procedure: RIGHT 1ST RAY AMPUTATION;  Surgeon: [name], MD;  Location: [facility];  Service: Orthopedics;  Laterality: Right;  . AMPUTATION Right 03/23/2018   Procedure: RIGHT 2ND TOE AMPUTATION;  Surgeon: [name], MD;  Location: [place];  Service: Orthopedics;  Laterality: Right;  . BRAIN SURGERY  2000   pituatary gland removed .  Marland Kitchen CHOLECYSTECTOMY  1984  . Ectropion surgery Bilateral 2006  . EYE SURGERY  over last 6 yrs.     trabeculectomy...   . EYE SURGERY   cat ext ou  . JOINT REPLACEMENT  2011   knee left  . KNEE ARTHROSCOPY Right 02/06/2015   Procedure: RIGHT ARTHROSCOPY KNEE WITH LATERAL MENSICAL  DEBRIDEMENT;  Surgeon: Gaynelle Arabian, MD;  Location: [facility];  Service: Orthopedics;  Laterality: Right;  . Mohs procedure      for skin cancer on nose   .  NEPHRECTOMY Right 1994   Renal cell  . REVERSE SHOULDER ARTHROPLASTY  12/15/2011   Procedure: REVERSE SHOULDER ARTHROPLASTY;  Surgeon: Marin Shutter, MD;  Location: [facility];  Service: Orthopedics;  Laterality: Right;  right total reverse shoulder  . TONSILLECTOMY    . TOTAL KNEE ARTHROPLASTY Right 06/29/2015   Procedure: TOTAL KNEE ARTHROPLASTY;  Surgeon: Gaynelle Arabian, MD;  Location: [facility];  Service: Orthopedics;  Laterality: Right;  . Transsphenoidal excision pituitary tumor  05/1999   A.Tommi Rumps, M.D.([facility])    Family History  Problem Relation Age of Onset  . Lung cancer Father   . Anesthesia problems Neg Hx   . Hypotension Neg Hx   . Malignant hyperthermia Neg Hx   .  Pseudochol deficiency Neg Hx    Social History:  reports that he quit smoking about 31 years ago. His smoking use included pipe. He has never used smokeless tobacco. He reports current alcohol use. He reports that he does not use drugs.  Allergies:  Allergies  Allergen Reactions  . Adhesive [Tape] Other (See Comments)    CAUSES SKIN TEARS, prefers paper tape   . Lyrica [Pregabalin] Swelling  . Percocet [Oxycodone-Acetaminophen] Other (See Comments)    UNSPECIFIED REACTION  Does not want to take  . Robaxin [Methocarbamol]     UNSPECIFIED REACTION   . Diovan [Valsartan] Rash    Medications Prior to Admission  Medication Sig Dispense Refill  . acetaminophen (TYLENOL) 500 MG tablet Take 1,000 mg by mouth 3 (three) times daily as needed for moderate pain.     . Cholecalciferol (VITAMIN D) 2000 units tablet Take 2,000 Units by mouth daily.    . Coenzyme Q10 300 MG CAPS Take 300 mg by mouth daily.     . Diclofenac Sodium 1.5 % SOLN Dispense 10 to 40 drops to affected area on skin as directed, twice per day 150 mL 3  . finasteride (PROSCAR) 5 MG tablet Take 5 mg by mouth daily.     . Flaxseed, Linseed, (FLAXSEED OIL) 1000 MG CAPS Take 1,000 mg by mouth daily.    . mirabegron ER (MYRBETRIQ) 25 MG TB24 tablet Take 25 mg by mouth every other day.    . Multiple Vitamins-Minerals (MULTIVITAMIN & MINERAL PO) Take 1 tablet by mouth daily.    . polyethylene glycol (MIRALAX / GLYCOLAX) 17 g packet Take 17 g by mouth daily.    . pravastatin (PRAVACHOL) 80 MG tablet Take 80 mg by mouth every other day.     . predniSONE (DELTASONE) 5 MG tablet Take 2.5-5 mg by mouth See admin instructions. Take 5 mg by mouth in the morning and 2.5 mg in the evening    . SYNTHROID 88 MCG tablet Take 88 mcg by mouth daily before breakfast.     . testosterone cypionate (DEPOTESTOSTERONE CYPIONATE) 200 MG/ML injection Inject 30 mg into the muscle every Friday.     . timolol (TIMOPTIC) 0.5 % ophthalmic solution Place 1 drop  into the right eye daily.    . traMADol (ULTRAM) 50 MG tablet Take 0.5-1 tablets (25-50 mg total) by mouth every 6 (six) hours as needed. 25 for mild to moderate pain, 50 for severe pain 90 tablet 1  . vitamin B-12 (CYANOCOBALAMIN) 500 MCG tablet Take 500 mcg by mouth daily.    Marland Kitchen erythromycin ophthalmic ointment 1 application at bedtime. (Patient not taking: Reported on 04/15/2020)    . Flaxseed, Linseed, (EQL FLAXSEED OIL) 1200 MG CAPS Take 1,200 mg by mouth daily.  (  Patient not taking: Reported on 04/15/2020)    . fluticasone (FLONASE) 50 MCG/ACT nasal spray Place 2 sprays into both nostrils daily.    . NON FORMULARY 1 application 4 (four) times daily as needed. CBD cream apply to affected area 4 x day as needed (Patient not taking: Reported on 04/15/2020)    . Omega-3 Fatty Acids (OMEGA-3 FISH OIL PO) Take 700 mg by mouth daily. (Patient not taking: Reported on 04/15/2020)      Results for orders placed or performed during the hospital encounter of 04/21/20 (from the past 48 hour(s))  SARS Coronavirus 2 by RT PCR (hospital order, performed in [facility] hospital lab) Nasopharyngeal Nasopharyngeal Swab     Status: None   Collection Time: 04/21/20  6:23 AM   Specimen: Nasopharyngeal Swab  Result Value Ref Range   SARS Coronavirus 2 NEGATIVE NEGATIVE    Comment: (NOTE) SARS-CoV-2 target nucleic acids are NOT DETECTED.  The SARS-CoV-2 RNA is generally detectable in upper and lower respiratory specimens during the acute phase of infection. The lowest concentration of SARS-CoV-2 viral copies this assay can detect is 250 copies / mL. A negative result does not preclude SARS-CoV-2 infection and should not be used as the sole basis for treatment or other patient management decisions.  A negative result may occur with improper specimen collection / handling, submission of specimen other than nasopharyngeal swab, presence of viral mutation(s) within the areas targeted by this assay, and inadequate  number of viral copies (<250 copies / mL). A negative result must be combined with clinical observations, patient history, and epidemiological information.  Fact Sheet for Patients:   StrictlyIdeas.no  Fact Sheet for Healthcare Providers: BankingDealers.co.za  This test is not yet approved or  cleared by the [place] FDA and has been authorized for detection and/or diagnosis of SARS-CoV-2 by FDA under an Emergency Use Authorization (EUA).  This EUA will remain in effect (meaning this test can be used) for the duration of the COVID-19 declaration under Section 564(b)(1) of the Act, 21 U.S.C. section 360bbb-3(b)(1), unless the authorization is terminated or revoked sooner.  Performed at [facility], [place] [address], [place], [place] [address]   CBC WITH DIFFERENTIAL     Status: Abnormal   Collection Time: 04/21/20  6:28 AM  Result Value Ref Range   WBC 13.4 (H) 4.0 - 10.5 K/uL   RBC 4.16 (L) 4.22 - 5.81 MIL/uL   Hemoglobin 13.4 13.0 - 17.0 g/dL   HCT 42.5 39 - 52 %   MCV 102.2 (H) 80.0 - 100.0 fL   MCH 32.2 26.0 - 34.0 pg   MCHC 31.5 30.0 - 36.0 g/dL   RDW 17.9 (H) 11.5 - 15.5 %   Platelets 289 150 - 400 K/uL   nRBC 0.7 (H) 0.0 - 0.2 %   Neutrophils Relative % 63 %   Neutro Abs 8.6 (H) 1.7 - 7.7 K/uL   Lymphocytes Relative 19 %   Lymphs Abs 2.5 0.7 - 4.0 K/uL   Monocytes Relative 10 %   Monocytes Absolute 1.4 (H) 0 - 1 K/uL   Eosinophils Relative 3 %   Eosinophils Absolute 0.3 0 - 0 K/uL   Basophils Relative 1 %   Basophils Absolute 0.1 0 - 0 K/uL   Immature Granulocytes 4 %   Abs Immature Granulocytes 0.53 (H) 0.00 - 0.07 K/uL    Comment: Performed at [facility], [address] [place], [place] [address]  Type and screen  Status: None   Collection Time: 04/21/20  6:41 AM  Result Value Ref Range   ABO/RH(D) O POS    Antibody Screen NEG    Sample Expiration      04/24/2020,2359 Performed at [facility], [place] [address], [place], [place] [address]    No results found.  Pertinent items noted in HPI and remainder of comprehensive ROS otherwise negative.  Blood pressure (!) 190/72, pulse (!) 52, temperature 97.7 F (36.5 C), resp. rate 19, height 5\' 11"  (1.803 m), weight 69.2 kg, SpO2 100 %.  Patient is awake and alert.  He is oriented and appropriate.  His speech is fluent.  His judgment insight are intact.  His cranial nerve function is normal bilateral.  Motor examination is intact bilateral.  Sensory examination is notable for some decreased sensory loss in his L5 and S1 dermatomes bilateral.  Deep tendon mixes are normal active scepter his Achilles reflexes are absent.  Gait is not tested secondary to his pain level.  Examination head ears eyes nose and throat is unremarked.  Chest and abdomen are benign.  Extremities are free from injury deformity. Assessment/Plan Grade 2 L5/S1 unstable degenerative spondylolisthesis with intractable back and lower extremity pain.  Plan bilateral L5/S1 decompressive laminotomies and foraminotomies followed by posterior lumbar interbody fusion utilizing interbody cages, local harvested autograft, and augmented with posterior lateral arthrodesis utilizing nonsegmental pedicle screw fixation and local autografting.  Risks and benefits been explained.  Patient wishes to proceed.  The patient is well aware that his advanced age makes him a much higher operative risk.  Both he and his family wish to proceed with surgery even in light of this elevated risk.  Cooper Render Vielka Klinedinst 04/21/2020, 7:49 AM

## 2020-04-21 NOTE — Transfer of Care (Signed)
Immediate Anesthesia Transfer of Care Note  Patient: [name] [name].  Procedure(s) Performed: Posterior Lumbar Interbody Fusion - Lumbar Five-Sacral One (N/A Back)  Patient Location: PACU  Anesthesia Type:General  Level of Consciousness: drowsy, patient cooperative and responds to stimulation  Airway & Oxygen Therapy: Patient Spontanous Breathing and Patient connected to face mask oxygen  Post-op Assessment: Report given to RN and Post -op Vital signs reviewed and stable  Post vital signs: Reviewed and stable  Last Vitals:  Vitals Value Taken Time  BP 129/105 04/21/20 1112  Temp    Pulse 85 04/21/20 1115  Resp 23 04/21/20 1112  SpO2 98 % 04/21/20 1115  Vitals shown include unvalidated device data.  Last Pain:  Vitals:   04/21/20 0654  PainSc: 4          Complications: No complications documented.

## 2020-04-21 NOTE — Progress Notes (Signed)
PHARMACIST - PHYSICIAN ORDER COMMUNICATION  CONCERNING: P&T Medication Policy on Herbal Medications  DESCRIPTION:  This patient's order for:  Co Q 10 and flaxseed oil  have been noted.  This product(s) is classified as an "herbal" or natural product. Due to a lack of definitive safety studies or FDA approval, nonstandard manufacturing practices, plus the potential risk of unknown drug-drug interactions while on inpatient medications, the Pharmacy and Therapeutics Committee does not permit the use of "herbal" or natural products of this type within [facility].   ACTION TAKEN: The pharmacy department is unable to verify this order at this time and your patient has been informed of this safety policy. Please reevaluate patient's clinical condition at discharge and address if the herbal or natural product(s) should be resumed at that time.   Ebonee Stober A. Levada Dy, PharmD, BCPS, FNKF Clinical Pharmacist  Please utilize Am[facility] for appropriate phone number to reach the unit pharmacist ([place])

## 2020-04-21 NOTE — Op Note (Signed)
Date of procedure: 04/21/2020  Date of dictation: Same  Service: Neurosurgery  Preoperative diagnosis: L5-S1 grade 2 unstable spondylolisthesis with severe radiculopathy  Postoperative diagnosis: Same  Procedure Name: Bilateral L5-S1 decompressive laminotomies and foraminotomies, more than would be required for simple interbody fusion alone.  L5-S1 posterior lumbar interbody fusion utilizing interbody cages and locally harvested autograft  L5-S1 posterior lateral arthrodesis utilizing nonsegmental pedicle screw fixation and local autograft  Surgeon:Kham Zuckerman A.Hessie Varone, M.D.  Asst. Surgeon: Reinaldo Meeker, NP  Anesthesia: General  Indication: 86 year old male with severe back and bilateral lower extremity pain progressive for the past 6 months and failing all efforts of conservative manage.  Work-up demonstrates evidence of an unstable grade 2 L5-S1 degenerative spondylolisthesis with severe neural foraminal stenosis.  Patient has failed all of the conservative management.  He and his family well recognize that given his advanced age she is at high risk for any type of operative procedure; he wishes to proceed with surgery.  Operative note: After induction anesthesia, patient position prone on the Wilson frame appropriate padded.  Patient's lumbar region prepped and sterilely.  Incision made overlying L5-S1.  Dissection performed bilaterally.  Retractor placed.  Fluoroscopy used.  Levels confirmed.  Decompressive laminotomies and facetectomies were then performed bilaterally using Leksell rongeurs and Kerrison rongeurs to remove the inferior two thirds of the lamina of L5 the entire inferior facet and pars interarticularis of L5.  The majority of the superior articular process of S1 was removed bilaterally and the superior aspect of the S1 lamina was removed.  Ligament flavum was elevated and resected.  Underlying thecal sac and exiting L5 and S1 nerve roots were identified.  Decompression completed on the  course exiting nerve roots.  Bilateral discectomies then performed at L5-S1.  The space then prepared for interbody fusion.  With a distractor placed the patient's right side to space cleared of all soft tissue.  A 10 mm Med[employer] expandable cage packed with locally harvested autograft was then packed into place and expanded to its full extent.  Distractor removed patient's contralateral side.  Dissipates.  On the right side.  Bone graft was then packed in the interspace.  Second cage packed with local bone was then impacted in the place and expanded to its full extent.  Pedicles at L5 and S1 were identified using surface landmarks and intraoperative fluoroscopy and superficial bone around the pedicle was then removed using high-speed drill each pedicle was then probed using pedicle all each pedicle all track was then probed and found to be solidly within the bone.  Each pedicle all track was then tapped with a screw tap.  Screw temple was probed and found to be solidly within the bone.  5.75 mm radius brand screws from [employer] were placed bilaterally at L5 and S1.  Final images reveal good position the cages and the hardware at the proper upper level with normal alignment of the spine.  Transverse processes and sacral ala were decorticated.  Morselized autograft was packed posterior laterally.  Short segment titanium rod was then placed through the screw heads at L5 and S1.  Locking caps placed over the screws and locking caps then engaged with a construct under compression.  Gelfoam was placed topically over the laminotomy sites.  Vancomycin powder was placed in deep wound space.  Wounds and closed in layers with Vicryl sutures.  Steri-Strips and sterile dressing were applied.  No apparent complications.  Patient tolerated the procedure well and he returned to the recovery room postop.

## 2020-04-21 NOTE — Anesthesia Procedure Notes (Signed)
Procedure Name: Intubation Date/Time: 04/21/2020 8:38 AM Performed by: Griffin Dakin, CRNA Pre-anesthesia Checklist: Patient identified, Emergency Drugs available, Suction available, Patient being monitored and Timeout performed Patient Re-evaluated:Patient Re-evaluated prior to induction Oxygen Delivery Method: Circle system utilized Preoxygenation: Pre-oxygenation with 100% oxygen Induction Type: IV induction Laryngoscope Size: Glidescope and 3 Grade View: Grade I Tube size: 7.5 mm Number of attempts: 1 Airway Equipment and Method: Rigid stylet and Video-laryngoscopy Placement Confirmation: ETT inserted through vocal cords under direct vision,  positive ETCO2 and breath sounds checked- equal and bilateral Secured at: 23 cm Dental Injury: Teeth and Oropharynx as per pre-operative assessment  Difficulty Due To: Difficulty was anticipated, Difficult Airway- due to reduced neck mobility, Difficult Airway- due to anterior larynx and Difficult Airway- due to limited oral opening

## 2020-04-21 NOTE — Progress Notes (Signed)
Orthopedic Tech Progress Note Patient Details:  [name] [name]. 1923-06-27 [id number] Patient has brace. Patient ID: [name], male   DOB: [birth year], 86 y.o.   MRN: [id number]   [name] 04/21/2020, 2:42 PM

## 2020-04-21 NOTE — Progress Notes (Signed)
Dr. Glennon Mac made aware and has seen abnormal EKG.

## 2020-04-21 NOTE — Anesthesia Postprocedure Evaluation (Addendum)
Anesthesia Post Note  Patient: [name] [name].  Procedure(s) Performed: Posterior Lumbar Interbody Fusion - Lumbar Five-Sacral One (N/A Back)     Patient location during evaluation: PACU Anesthesia Type: General Level of consciousness: patient cooperative and sedated Pain management: pain level controlled Vital Signs Assessment: post-procedure vital signs reviewed and stable Respiratory status: spontaneous breathing, nonlabored ventilation, respiratory function stable and patient connected to nasal cannula oxygen Cardiovascular status: blood pressure returned to baseline and stable Postop Assessment: no apparent nausea or vomiting Anesthetic complications: no   No complications documented.  Last Vitals:  Vitals:   04/21/20 1213 04/21/20 1302  BP: 127/80 (!) 153/73  Pulse: 76 81  Resp: 19 18  Temp: (!) 36.2 C 36.6 C  SpO2: 94% 98%    Last Pain:  Vitals:   04/21/20 1213  PainSc: 3                  Gagan Dillion,E. Elizandro Laura

## 2020-04-22 LAB — GLUCOSE, CAPILLARY: Glucose-Capillary: 190 mg/dL — ABNORMAL HIGH (ref 70–99)

## 2020-04-22 MED ORDER — TAMSULOSIN HCL 0.4 MG PO CAPS
0.4000 mg | ORAL_CAPSULE | Freq: Every day | ORAL | Status: DC
  Administered 2020-04-22 – 2020-04-23 (×2): 0.4 mg via ORAL
  Filled 2020-04-22 (×2): qty 1

## 2020-04-22 NOTE — Progress Notes (Signed)
Postop day 1. Patient doing very well overall. Minimal back pain. No lower extremity pain. Able to stand with assistance with therapy for the first time in many months. Taking a few steps with his walker.  Awake and alert. Oriented and appropriate. Blood pressure acceptable. Urine output good. Awake and alert. Oriented and appropriate. Motor and sensory function intact. Wound clean and dry.  Overall patient doing very well. Plan for discharge to [facility] tomorrow.

## 2020-04-22 NOTE — Evaluation (Signed)
Occupational Therapy Evaluation Patient Details Name: [name] [name]. MRN: [id number] DOB: 07-20-1923 Today's Date: 04/22/2020    History of Present Illness Pt is a 86 y.o. M with significant PMH of CA, CKD stage 3, DM type 2, bilateral TKA, R reverse TSA, right 1st ray and 2nd toe amputation who presents with L5-S1 grade 2 unstable spondylolisthesis with severe radiculopathy now s/p bilateral L5-S1 decompressive laminotomies and foraminotomies, L5-S1 PLIF.    Clinical Impression   Pt had not been ambulating for 2 months prior to surgery. He was assisted for bathing ,dressing and toileting. Pt presents with generalized weakness and poor standing balance. He requires +2 assist for mobility and set up to total assist for ADL. Pt will need post acute rehab at [facility] prior to return to [place]. Will follow acutely.    Follow Up Recommendations  SNF    Equipment Recommendations  Other (comment) (defer to next venue)    Recommendations for Other Services       Precautions / Restrictions Precautions Precautions: Back;Fall Precaution Booklet Issued: Yes (comment) Precaution Comments: Verbally reviewed, provided written handout Required Braces or Orthoses: Spinal Brace Spinal Brace: Lumbar corset;Applied in sitting position Restrictions Weight Bearing Restrictions: No      Mobility Bed Mobility Overal bed mobility: Needs Assistance Bed Mobility: Rolling;Sidelying to Sit Rolling: Mod assist Sidelying to sit: Max assist;+2 for physical assistance       General bed mobility comments: ModA to roll towards right side of bed, maxA + 2 for BLE facilitation off edge of bed and trunk to upright positioning  Transfers Overall transfer level: Needs assistance Equipment used: Rolling walker (2 wheeled) Transfers: Sit to/from Stand Sit to Stand: Mod assist;+2 physical assistance         General transfer comment: ModA + 2 to rise to stand from edge of bed, bilateral foot block  provided to prevent anterior slide, cues for anterior weight shift due to retropulsion    Balance Overall balance assessment: Needs assistance Sitting-balance support: Feet supported Sitting balance-Leahy Scale: Fair     Standing balance support: Bilateral upper extremity supported Standing balance-Leahy Scale: Poor Standing balance comment: reliant on external support                           ADL either performed or assessed with clinical judgement   ADL Overall ADL's : Needs assistance/impaired Eating/Feeding: Independent   Grooming: Set up;Sitting   Upper Body Bathing: Minimal assistance;Sitting   Lower Body Bathing: Total assistance;+2 for physical assistance;Sit to/from stand   Upper Body Dressing : Minimal assistance;Sitting   Lower Body Dressing: +2 for physical assistance;Total assistance;Sit to/from stand       Toileting- Water quality scientist and Hygiene: Total assistance;+2 for physical assistance;Sit to/from stand               Vision Patient Visual Report: No change from baseline       Perception     Praxis      Pertinent Vitals/Pain Pain Assessment: Faces Faces Pain Scale: Hurts little more Pain Location: surgical site Pain Descriptors / Indicators: Operative site guarding;Grimacing Pain Intervention(s): Monitored during session;Repositioned;Premedicated before session     Hand Dominance Right   Extremity/Trunk Assessment Upper Extremity Assessment Upper Extremity Assessment: Overall WFL for tasks assessed   Lower Extremity Assessment Lower Extremity Assessment: Defer to PT evaluation RLE Deficits / Details: Grossly 4/5 LLE Deficits / Details: Grossly 2+/5   Cervical / Trunk Assessment Cervical /  Trunk Assessment: Other exceptions Cervical / Trunk Exceptions: s/p PLIF   Communication Communication Communication: HOH   Cognition Arousal/Alertness: Awake/alert Behavior During Therapy: WFL for tasks  assessed/performed Overall Cognitive Status: Within Functional Limits for tasks assessed                                 General Comments: WFL for basic mobility tasks, likely at baseline   General Comments       Exercises     Shoulder Instructions      Home Living Family/patient expects to be discharged to:: Skilled nursing facility                                 Additional Comments: [facility]      Prior Functioning/Environment Level of Independence: Needs assistance  Gait / Transfers Assistance Needed: Using lift out of bed, reports last ambulatory ~2 months ago with a walker ADL's / Homemaking Assistance Needed: requiring assist for bathing, dressing, toileting from staff            OT Problem List: Impaired balance (sitting and/or standing);Decreased strength;Decreased knowledge of use of DME or AE;Pain;Decreased knowledge of precautions      OT Treatment/Interventions: Self-care/ADL training;DME and/or AE instruction;Patient/family education;Balance training;Therapeutic activities    OT Goals(Current goals can be found in the care plan section) Acute Rehab OT Goals Patient Stated Goal: walk again OT Goal Formulation: With patient Time For Goal Achievement: 05/06/20 Potential to Achieve Goals: Good ADL Goals Pt Will Perform Grooming: with min assist;standing Pt Will Transfer to Toilet: with mod assist;ambulating;bedside commode Pt Will Perform Toileting - Clothing Manipulation and hygiene: with mod assist;sit to/from stand Additional ADL Goal #1: Pt will perform bed mobility with min assist using log roll technique in preparation for ADL.  OT Frequency: Min 2X/week   Barriers to D/C:            Co-evaluation PT/OT/SLP Co-Evaluation/Treatment: Yes Reason for Co-Treatment: For patient/therapist safety PT goals addressed during session: Mobility/safety with mobility OT goals addressed during session: ADL's and self-care       AM-PAC OT "6 Clicks" Daily Activity     Outcome Measure Help from another person eating meals?: None Help from another person taking care of personal grooming?: A Little Help from another person toileting, which includes using toliet, bedpan, or urinal?: Total Help from another person bathing (including washing, rinsing, drying)?: A Lot Help from another person to put on and taking off regular upper body clothing?: A Little Help from another person to put on and taking off regular lower body clothing?: Total 6 Click Score: 14   End of Session Equipment Utilized During Treatment: Gait belt;Rolling walker;Back brace Nurse Communication: Mobility status  Activity Tolerance: Patient tolerated treatment well Patient left: in chair;with call bell/phone within reach;with nursing/sitter in room  OT Visit Diagnosis: Unsteadiness on feet (R26.81);Other abnormalities of gait and mobility (R26.89);Pain;Muscle weakness (generalized) (M62.81)                Time: 5053-9767 OT Time Calculation (min): 30 min Charges:  OT General Charges $OT Visit: 1 Visit OT Evaluation $OT Eval Moderate Complexity: 1 Mod  Nestor Lewandowsky, OTR/L Acute Rehabilitation Services Pager: [phone] Office: [phone]  Malka So 04/22/2020, 12:40 PM

## 2020-04-22 NOTE — TOC Initial Note (Signed)
Transition of Care (TOC) - Initial/Assessment Note    Patient Details  Name: [name] [name]. MRN: [id number] Date of Birth: [birth year]  [facility]) CM/SW Contact:    Benard Halsted, LCSW Phone Number: 04/22/2020, 10:21 AM  Clinical Narrative:                 CSW received consult for possible SNF placement at time of discharge. CSW spoke with patient and his spouse at bedside regarding PT recommendation of SNF placement at time of discharge. Patient has been on the Rehab side at [facility] while his wife is in [place]. He requests to return to the rehab side. CSW spoke with Butch Penny at [facility]. She reported they do have a bed for patient on the rehab side and states no insurance authorization is needed. Patient is requesting transport by [facility] Officer if possible.    Expected Discharge Plan: Skilled Nursing Facility Barriers to Discharge: Continued Medical Work up   Patient Goals and CMS Choice Patient states their goals for this hospitalization and ongoing recovery are:: Rehab   Choice offered to / list presented to : Patient, Spouse  Expected Discharge Plan and Services Expected Discharge Plan: [place] In-house Referral: Clinical Social Work   Post Acute Care Choice: [place] Living arrangements for the past 2 months: Materials engineer, [place]                                      Prior Living Arrangements/Services Living arrangements for the past 2 months: Materials engineer, [place] Lives with:: Facility Resident Patient language and need for interpreter reviewed:: Yes Do you feel safe going back to the place where you live?: Yes      Need for Family Participation in Patient Care: Yes (Comment) Care giver support system in place?: Yes (comment) Current home services: DME Criminal Activity/Legal Involvement Pertinent to Current Situation/Hospitalization: No - Comment  as needed  Activities of Daily Living Home Assistive Devices/Equipment: Environmental consultant (specify type), Eyeglasses, Dentures (specify type) ADL Screening (condition at time of admission) Patient's cognitive ability adequate to safely complete daily activities?: Yes Is the patient deaf or have difficulty hearing?: Yes Does the patient have difficulty seeing, even when wearing glasses/contacts?: No Does the patient have difficulty concentrating, remembering, or making decisions?: No Patient able to express need for assistance with ADLs?: Yes Does the patient have difficulty dressing or bathing?: No Independently performs ADLs?: Yes (appropriate for developmental age) Does the patient have difficulty walking or climbing stairs?: Yes Weakness of Legs: Both Weakness of Arms/Hands: None  Permission Sought/Granted Permission sought to share information with : Facility Sport and exercise psychologist, Family Supports Permission granted to share information with : Yes, Verbal Permission Granted  Share Information with NAME: Loryn  Permission granted to share info w AGENCY: [facility]  Permission granted to share info w Relationship: Spouse  Permission granted to share info w Contact Information: [phone]  Emotional Assessment Appearance:: Appears stated age Attitude/Demeanor/Rapport: Engaged Affect (typically observed): Appropriate Orientation: : Oriented to Self, Oriented to Place, Oriented to  Time, Oriented to Situation Alcohol / Substance Use: Not Applicable Psych Involvement: No (comment)  Admission diagnosis:  Degenerative spondylolisthesis [M43.10] Patient Active Problem List   Diagnosis Date Noted  . Degenerative spondylolisthesis 04/21/2020  . Lumbar radiculopathy 02/25/2019  . Spinal stenosis of lumbar region with neurogenic claudication 02/25/2019  . Foraminal stenosis  of lumbar region 02/25/2019  . Diabetic polyneuropathy associated with type 2 diabetes mellitus ([place]) 11/14/2018  .  Balance problem 07/11/2018  . History of complete ray amputation of second toe of right foot ([place]) 03/27/2018  . Chronic left-sided low back pain with left-sided sciatica 03/27/2018  . History of complete ray amputation of first toe of right foot ([place]) 12/28/2017  . Idiopathic chronic venous hypertension of both lower extremities with inflammation 12/13/2017  . Adrenal insufficiency ([place]) 07/01/2017  . Bulging of intervertebral disc between L4 and L5 07/01/2017  . At high risk for bleeding 07/01/2017  . Right buttock pain 02/08/2017  . HLD (hyperlipidemia) 04/20/2016  . BPH (benign prostatic hyperplasia) 12/08/2015  . CKD (chronic kidney disease) stage 3, GFR 30-59 ml/min (HCC) 11/27/2015  . S/P total knee arthroplasty 07/13/2015  . OA (osteoarthritis) of knee 06/29/2015  . Ectropion 05/06/2015  . Lateral meniscal tear 02/05/2015  . Hyponatremia 10/20/2013  . Weakness 10/19/2013  . Hypertension   . 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 Hypothyroidism (acquired)   . Anemia, B12 deficiency   . Meibomian gland disease 08/06/2013  . Primary open angle glaucoma 06/19/2013  . Arthritis of shoulder region, right 12/15/2011  . Exposure keratitis 08/17/2011  . Dry eye 07/13/2011   PCP:  [name], DO Pharmacy:   [facility], [place] [address] Phone: [phone] Fax: [place], [place] [address] Phone: [phone] Fax: [phone]     Social Determinants of Health (SDOH) Interventions    Readmission Risk Interventions No flowsheet data found.

## 2020-04-22 NOTE — TOC Progression Note (Signed)
Transition of Care (TOC) - Progression Note    Patient Details  Name: [name] [name]. MRN: [id number] Date of Birth: Apr 14, 1923  Transition of Care [facility]) CM/SW [place], LCSW Phone Number: 04/22/2020, 3:14 PM  Clinical Narrative:    CSW confirmed with [facility] that they can have their security pick patient up tomorrow in his wheelchair once the discharge summary is ready. Patient's spouse aware and she will ride with security to pick patient up.    Expected Discharge Plan: [place] Barriers to Discharge: Continued Medical Work up  Expected Discharge Plan and Services Expected Discharge Plan: [place] In-house Referral: Clinical Social Work   Post Acute Care Choice: [place] Living arrangements for the past 2 months: [place], [place]                                       Social Determinants of Health (SDOH) Interventions    Readmission Risk Interventions No flowsheet data found.

## 2020-04-22 NOTE — Evaluation (Signed)
Physical Therapy Evaluation Patient Details Name: [name] [name]. MRN: [id number] DOB: 12/07/1922 Today's Date: 04/22/2020   History of Present Illness  Pt is a 86 y.o. M with significant PMH of CA, CKD stage 3, DM type 2, bilateral TKA, R reverse TSA, right 1st ray and 2nd toe amputation who presents with L5-S1 grade 2 unstable spondylolisthesis with severe radiculopathy now s/p bilateral L5-S1 decompressive laminotomies and foraminotomies, L5-S1 PLIF.   Clinical Impression  Pt evaluated s/p procedure listed above. Prior to admission, pt from [facility], using a lift to a wheelchair for out of bed mobility, and requiring assist for ADL's. On PT evaluation, pt requiring two person mod-maximal assist for mobility. Ambulating 15 feet with a walker and close chair follow. Displays weakness (LLE weaker than RLE), balance deficits, post surgical pain and decreased activity tolerance. Suspect steady progress. Recommend SNF to address deficits and maximize functional mobility.     Follow Up Recommendations SNF ([facility])    Equipment Recommendations  None recommended by PT    Recommendations for Other Services       Precautions / Restrictions Precautions Precautions: Back;Fall Precaution Booklet Issued: Yes (comment) Precaution Comments: Verbally reviewed, provided written handout Required Braces or Orthoses: Spinal Brace Spinal Brace: Applied in sitting position;Lumbar corset Restrictions Weight Bearing Restrictions: No      Mobility  Bed Mobility Overal bed mobility: Needs Assistance Bed Mobility: Rolling;Sidelying to Sit Rolling: Mod assist Sidelying to sit: Max assist;+2 for physical assistance       General bed mobility comments: ModA to roll towards right side of bed, maxA + 2 for BLE facilitation off edge of bed and trunk to upright positioning  Transfers Overall transfer level: Needs assistance Equipment used: Rolling walker (2 wheeled) Transfers: Sit to/from  Stand Sit to Stand: Mod assist;+2 physical assistance         General transfer comment: ModA + 2 to rise to stand from edge of bed, bilateral foot block provided to prevent anterior slide, cues for anterior weight shift due to retropulsion  Ambulation/Gait Ambulation/Gait assistance: Mod assist;+2 physical assistance Gait Distance (Feet): 15 Feet Assistive device: Rolling walker (2 wheeled) Gait Pattern/deviations: Decreased stride length;Narrow base of support;Step-to pattern Gait velocity: decreased Gait velocity interpretation: <1.31 ft/sec, indicative of household ambulator General Gait Details: ModA + 2 for stability (close chair follow utilized), step to gait pattern, pt with posterior lean and bilateral knee hyperextension  Stairs            Wheelchair Mobility    Modified Rankin (Stroke Patients Only)       Balance Overall balance assessment: Needs assistance Sitting-balance support: Feet supported Sitting balance-Leahy Scale: Fair     Standing balance support: Bilateral upper extremity supported Standing balance-Leahy Scale: Poor Standing balance comment: reliant on external support                             Pertinent Vitals/Pain Pain Assessment: Faces Faces Pain Scale: Hurts little more Pain Location: surgical site Pain Descriptors / Indicators: Operative site guarding;Grimacing Pain Intervention(s): Monitored during session    Home Living Family/patient expects to be discharged to:: Skilled nursing facility                 Additional Comments: [facility]    Prior Function Level of Independence: Needs assistance   Gait / Transfers Assistance Needed: Using lift out of bed, reports last ambulatory ~2 months ago with a walker  ADL's /  Homemaking Assistance Needed: requiring assist for ADL's from staff        Hand Dominance        Extremity/Trunk Assessment   Upper Extremity Assessment Upper Extremity Assessment: Defer  to OT evaluation    Lower Extremity Assessment Lower Extremity Assessment: RLE deficits/detail;LLE deficits/detail RLE Deficits / Details: Grossly 4/5 LLE Deficits / Details: Grossly 2+/5    Cervical / Trunk Assessment Cervical / Trunk Assessment: Other exceptions Cervical / Trunk Exceptions: s/p PLIF  Communication   Communication: HOH  Cognition Arousal/Alertness: Awake/alert Behavior During Therapy: WFL for tasks assessed/performed Overall Cognitive Status: Within Functional Limits for tasks assessed                                 General Comments: WFL for basic mobility tasks, likely at baseline      General Comments      Exercises     Assessment/Plan    PT Assessment Patient needs continued PT services  PT Problem List Decreased strength;Decreased activity tolerance;Decreased balance;Decreased mobility;Pain       PT Treatment Interventions DME instruction;Gait training;Functional mobility training;Therapeutic activities;Therapeutic exercise;Balance training;Wheelchair mobility training;Patient/family education    PT Goals (Current goals can be found in the Care Plan section)  Acute Rehab PT Goals Patient Stated Goal: walk again PT Goal Formulation: With patient Time For Goal Achievement: 05/06/20 Potential to Achieve Goals: Good    Frequency Min 5X/week   Barriers to discharge        Co-evaluation PT/OT/SLP Co-Evaluation/Treatment: Yes Reason for Co-Treatment: For patient/therapist safety;To address functional/ADL transfers PT goals addressed during session: Mobility/safety with mobility         AM-PAC PT "6 Clicks" Mobility  Outcome Measure Help needed turning from your back to your side while in a flat bed without using bedrails?: A Lot Help needed moving from lying on your back to sitting on the side of a flat bed without using bedrails?: Total Help needed moving to and from a bed to a chair (including a wheelchair)?: A Lot Help  needed standing up from a chair using your arms (e.g., wheelchair or bedside chair)?: A Lot Help needed to walk in hospital room?: A Lot Help needed climbing 3-5 steps with a railing? : Total 6 Click Score: 10    End of Session Equipment Utilized During Treatment: Gait belt;Back brace Activity Tolerance: Patient tolerated treatment well Patient left: in chair;with call bell/phone within reach Nurse Communication: Mobility status PT Visit Diagnosis: Pain;Difficulty in walking, not elsewhere classified (R26.2);Muscle weakness (generalized) (M62.81) Pain - part of body:  (back)    Time: 7989-2119 PT Time Calculation (min) (ACUTE ONLY): 30 min   Charges:   PT Evaluation $PT Eval Moderate Complexity: 1 Mod            Wyona Almas, PT, DPT Acute Rehabilitation Services Pager [phone] Office [phone]   Deno Etienne 04/22/2020, 10:33 AM

## 2020-04-22 NOTE — NC FL2 (Signed)
[place] LEVEL OF CARE SCREENING TOOL     IDENTIFICATION  Patient Name: [name] [name]. Birthdate: 07/13/1923 Sex: male Admission Date (Current Location): 04/21/2020  [facility] and [place] Number:  Herbalist and Address:  [facility] [place], [place] [address], [place], [place] [address]      Provider Number: [id number]  Attending Physician Name and Address:  Earnie Larsson, MD  Relative Name and Phone Number:  Lynnell Catalan, spouse, [phone]    Current Level of Care: Hospital Recommended Level of Care: [place] Prior Approval Number:    Date Approved/Denied:   PASRR Number: [id number]  Discharge Plan: SNF    Current Diagnoses: Patient Active Problem List   Diagnosis Date Noted  . Degenerative spondylolisthesis 04/21/2020  . Lumbar radiculopathy 02/25/2019  . Spinal stenosis of lumbar region with neurogenic claudication 02/25/2019  . Foraminal stenosis of lumbar region 02/25/2019  . Diabetic polyneuropathy associated with type 2 diabetes mellitus ([place]) 11/14/2018  . Balance problem 07/11/2018  . 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 Chronic left-sided low back pain with left-sided sciatica 03/27/2018  . History of complete ray amputation of first toe of right foot ([place]) 12/28/2017  . Idiopathic chronic venous hypertension of both lower extremities with inflammation 12/13/2017  . Adrenal insufficiency ([place]) 07/01/2017  . Bulging of intervertebral disc between L4 and L5 07/01/2017  . At high risk for bleeding 07/01/2017  . Right buttock pain 02/08/2017  . HLD (hyperlipidemia) 04/20/2016  . BPH (benign prostatic hyperplasia) 12/08/2015  . CKD (chronic kidney disease) stage 3, GFR 30-59 ml/min (HCC) 11/27/2015  . S/P total knee arthroplasty 07/13/2015  . OA (osteoarthritis) of knee 06/29/2015  . Ectropion 05/06/2015  . Lateral meniscal tear 02/05/2015  . Hyponatremia  10/20/2013  . Weakness 10/19/2013  . Hypertension   . 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 Hypothyroidism (acquired)   . Anemia, B12 deficiency   . Meibomian gland disease 08/06/2013  . Primary open angle glaucoma 06/19/2013  . Arthritis of shoulder region, right 12/15/2011  . Exposure keratitis 08/17/2011  . Dry eye 07/13/2011    Orientation RESPIRATION BLADDER Height & Weight     Self, Time, Situation, Place  Normal Indwelling catheter, Continent Weight: 152 lb 8.9 oz (69.2 kg) Height:  5\' 11"  (180.3 cm)  BEHAVIORAL SYMPTOMS/MOOD NEUROLOGICAL BOWEL NUTRITION STATUS      Continent Diet (Please see dc summary)  AMBULATORY STATUS COMMUNICATION OF NEEDS Skin   Extensive Assist Verbally Surgical wounds (Closed incision on back and foot)                       Personal Care Assistance Level of Assistance  Bathing, Feeding, Dressing Bathing Assistance: Maximum assistance Feeding assistance: Limited assistance Dressing Assistance: Limited assistance     Functional Limitations Info  Sight, Hearing, Speech Sight Info: Adequate Hearing Info: Adequate Speech Info: Adequate    SPECIAL CARE FACTORS FREQUENCY  PT (By licensed PT), OT (By licensed OT)     PT Frequency: 5x/week OT Frequency: 5x/week            Contractures Contractures Info: Not present    Additional Factors Info  Code Status, Allergies Code Status Info: Full Allergies Info: Adhesive (Tape), Lyrica (Pregabalin), Percocet (Oxycodone-acetaminophen), Robaxin (Methocarbamol), Diovan (Valsartan)           Current Medications (04/22/2020):  This is the current hospital active medication list  Current Facility-Administered Medications  Medication Dose Route Frequency Provider Last Rate Last Admin  . 0.9 %  sodium chloride infusion  250 mL Intravenous Continuous Earnie Larsson, MD 1 mL/hr at 04/21/20 1338 250 mL at 04/21/20 1338  . acetaminophen (TYLENOL) tablet 650 mg  650 mg Oral Q4H PRN Earnie Larsson, MD       Or  . acetaminophen (TYLENOL) suppository 650 mg  650 mg Rectal Q4H PRN Earnie Larsson, MD      . acetaminophen (TYLENOL) tablet 500-1,000 mg  500-1,000 mg Oral Q6H Earnie Larsson, MD   500 mg at 04/21/20 2003  . bisacodyl (DULCOLAX) suppository 10 mg  10 mg Rectal Daily PRN Earnie Larsson, MD      . cholecalciferol (VITAMIN D3) tablet 2,000 Units  2,000 Units Oral Daily Earnie Larsson, MD   2,000 Units at 04/22/20 [phone]  . finasteride (PROSCAR) tablet 5 mg  5 mg Oral Daily Earnie Larsson, MD   5 mg at 04/22/20 0958  . fluticasone (FLONASE) 50 MCG/ACT nasal spray 2 spray  2 spray Each Nare Daily Earnie Larsson, MD   2 spray at 04/22/20 [phone]  . HYDROmorphone (DILAUDID) injection 1 mg  1 mg Intravenous Q2H PRN Earnie Larsson, MD      . levothyroxine (SYNTHROID) tablet 88 mcg  88 mcg Oral Q0600 Earnie Larsson, MD   88 mcg at 04/22/20 0415  . menthol-cetylpyridinium (CEPACOL) lozenge 3 mg  1 lozenge Oral PRN Earnie Larsson, MD       Or  . phenol (CHLORASEPTIC) mouth spray 1 spray  1 spray Mouth/Throat PRN Earnie Larsson, MD      . mirabegron ER [facility]) tablet 25 mg  25 mg Oral Constance Haw, MD      . multivitamin liquid   Oral Daily Earnie Larsson, MD   15 mL at 04/22/20 0959  . ondansetron (ZOFRAN) tablet 4 mg  4 mg Oral Q6H PRN Earnie Larsson, MD       Or  . ondansetron (ZOFRAN) injection 4 mg  4 mg Intravenous Q6H PRN Pool, Mallie Mussel, MD      . polyethylene glycol (MIRALAX / GLYCOLAX) packet 17 g  17 g Oral Daily Pool, Mallie Mussel, MD      . polyethylene glycol (MIRALAX / GLYCOLAX) packet 17 g  17 g Oral Daily PRN Earnie Larsson, MD      . pravastatin (PRAVACHOL) tablet 80 mg  80 mg Oral Constance Haw, MD   80 mg at 04/21/20 1523  . predniSONE (DELTASONE) tablet 2.5 mg  2.5 mg Oral QHS Pierce, Dwayne A, RPH   2.5 mg at 04/21/20 2009  . predniSONE (DELTASONE) tablet 5 mg  5 mg Oral Daily Earnie Larsson, MD   5 mg at 04/22/20 0958  . sodium chloride flush (NS) 0.9 % injection 3 mL  3 mL Intravenous Q12H Earnie Larsson, MD    3 mL at 04/22/20 0959  . sodium chloride flush (NS) 0.9 % injection 3 mL  3 mL Intravenous PRN Earnie Larsson, MD      . sodium phosphate (FLEET) 7-19 GM/118ML enema 1 enema  1 enema Rectal Once PRN Earnie Larsson, MD      . timolol (TIMOPTIC) 0.5 % ophthalmic solution 1 drop  1 drop Right Eye Daily Earnie Larsson, MD   1 drop at 04/22/20 0957  . traMADol (ULTRAM) tablet 25-50 mg  25-50 mg Oral Q6H PRN Earnie Larsson, MD   50 mg at 04/22/20 0959  . vitamin B-12 (CYANOCOBALAMIN)  tablet 500 mcg  500 mcg Oral Daily Earnie Larsson, MD   500 mcg at 04/22/20 7412     Discharge Medications: Please see discharge summary for a list of discharge medications.  Relevant Imaging Results:  Relevant Lab Results:   Additional Information SS#: [id number]  Benard Halsted, LCSW

## 2020-04-23 LAB — GLUCOSE, CAPILLARY: Glucose-Capillary: 133 mg/dL — ABNORMAL HIGH (ref 70–99)

## 2020-04-23 MED ORDER — TRAMADOL HCL 50 MG PO TABS: 25.0000 mg | ORAL_TABLET | Freq: Four times a day (QID) | ORAL | 1 refills | Status: AC | PRN

## 2020-04-23 MED ORDER — METHYLPREDNISOLONE 4 MG PO TBPK: ORAL_TABLET | ORAL | 0 refills | Status: DC

## 2020-04-23 NOTE — Discharge Instructions (Signed)

## 2020-04-23 NOTE — Plan of Care (Signed)
Patient alert and oriented,Adequately ready for discharge to facility. Vitals stable and wnl.

## 2020-04-23 NOTE — TOC Transition Note (Signed)
Transition of Care [facility]) - CM/SW Discharge Note   Patient Details  Name: [name] [name]. MRN: [id number] Date of Birth: 1923/02/17  [facility] of [facility]) CM/SW Contact:  Benard Halsted, LCSW Phone Number: 04/23/2020, 9:35 AM   Clinical Narrative:    Patient will DC to: [facility] Anticipated DC date: 04/23/20 Family notified: Spouse, Chief Technology Officer by: [facility] Lucianne Lei (wife to travel with them)   Per MD patient ready for DC to [facility]. RN to call report prior to discharge [phone], Room 158). RN, patient, patient's family, and facility notified of DC. Discharge Summary and FL2 sent to facility.   CSW will sign off for now as social work intervention is no longer needed. Please consult [place] again if new needs arise.      Final next level of care: Skilled Nursing Facility Barriers to Discharge: Barriers Resolved   Patient Goals and CMS Choice Patient states their goals for this hospitalization and ongoing recovery are:: Rehab CMS Medicare.gov Compare Post Acute Care list provided to:: Patient Choice offered to / list presented to : Patient, Spouse  Discharge Placement   Existing PASRR number confirmed : 04/23/20          Patient chooses bed at: [facility] Patient to be transferred to facility by: [facility] Name of family member notified: Spouse, Loryn Patient and family notified of of transfer: 04/23/20  Discharge Plan and Services In-house Referral: Clinical Social Work   Post Acute Care Choice: [place]                               Social Determinants of Health (SDOH) Interventions     Readmission Risk Interventions No flowsheet data found.

## 2020-04-23 NOTE — Discharge Summary (Signed)
Physician Discharge Summary  Patient ID: [name] [name]. MRN: [id number] DOB/AGE: [birth year] 86 y.o.  Admit date: 04/21/2020 Discharge date: 04/23/2020  Admission Diagnoses:  Discharge Diagnoses:  Active Problems:   Degenerative spondylolisthesis   Discharged Condition: good  Hospital Course: Patient into the hospital where he underwent uncomplicated Y8-F0 decompression fusion.  Postoperatively doing very well.  Preoperative back and lower extremity pain very much improved.  Able to stand and walk with therapy which she had not been doing for many months.  Patient voiding without difficulty.  Ready for discharge back to a skilled nursing facility.  Consults:   Significant Diagnostic Studies:   Treatments:   Discharge Exam: Blood pressure (!) 145/54, pulse 68, temperature 97.9 F (36.6 C), resp. rate 18, height 5\' 11"  (2.774 m), weight 69.2 kg, SpO2 95 %. Awake and alert.  Oriented and appropriate.  Motor and sensory function intact.  Wound clean and dry.  Chest and abdomen benign.  Disposition: Discharge disposition: 03-Skilled Nursing Facility        Allergies as of 04/23/2020      Reactions   Adhesive [tape] Other (See Comments)   CAUSES SKIN TEARS, prefers paper tape    Lyrica [pregabalin] Swelling   Percocet [oxycodone-acetaminophen] Other (See Comments)   UNSPECIFIED REACTION  Does not want to take   Robaxin [methocarbamol]    UNSPECIFIED REACTION    Diovan [valsartan] Rash      Medication List    TAKE these medications   acetaminophen 500 MG tablet Commonly known as: TYLENOL Take 1,000 mg by mouth 3 (three) times daily as needed for moderate pain.   Coenzyme Q10 300 MG Caps Take 300 mg by mouth daily.   Diclofenac Sodium 1.5 % Soln Dispense 10 to 40 drops to affected area on skin as directed, twice per day   EQL Flaxseed Oil 1200 MG Caps Take 1,200 mg by mouth daily.   Flaxseed Oil 1000 MG Caps Take 1,000 mg by mouth daily.   erythromycin  ophthalmic ointment 1 application at bedtime.   finasteride 5 MG tablet Commonly known as: PROSCAR Take 5 mg by mouth daily.   fluticasone 50 MCG/ACT nasal spray Commonly known as: FLONASE Place 2 sprays into both nostrils daily.   methylPREDNISolone 4 MG Tbpk tablet Commonly known as: MEDROL DOSEPAK follow package directions   MULTIVITAMIN & MINERAL PO Take 1 tablet by mouth daily.   Myrbetriq 25 MG Tb24 tablet Generic drug: mirabegron ER Take 25 mg by mouth every other day.   NON FORMULARY 1 application 4 (four) times daily as needed. CBD cream apply to affected area 4 x day as needed   OMEGA-3 FISH OIL PO Take 700 mg by mouth daily.   polyethylene glycol 17 g packet Commonly known as: MIRALAX / GLYCOLAX Take 17 g by mouth daily.   pravastatin 80 MG tablet Commonly known as: PRAVACHOL Take 80 mg by mouth every other day.   predniSONE 5 MG tablet Commonly known as: DELTASONE Take 2.5-5 mg by mouth See admin instructions. Take 5 mg by mouth in the morning and 2.5 mg in the evening   Synthroid 88 MCG tablet Generic drug: levothyroxine Take 88 mcg by mouth daily before breakfast.   testosterone cypionate 200 MG/ML injection Commonly known as: DEPOTESTOSTERONE CYPIONATE Inject 30 mg into the muscle every Friday.   timolol 0.5 % ophthalmic solution Commonly known as: TIMOPTIC Place 1 drop into the right eye daily.   traMADol 50 MG tablet Commonly known as: Veatrice Bourbon  Take 0.5-1 tablets (25-50 mg total) by mouth every 6 (six) hours as needed. 25 for mild to moderate pain, 50 for severe pain   vitamin B-12 500 MCG tablet Commonly known as: CYANOCOBALAMIN Take 500 mcg by mouth daily.   Vitamin D 50 MCG (2000 UT) tablet Take 2,000 Units by mouth daily.            Durable Medical Equipment  (From admission, onward)         Start     Ordered   04/21/20 1309  DME Walker rolling  Once       Question:  Patient needs a walker to treat with the following  condition  Answer:  Degenerative spondylolisthesis   04/21/20 1308   04/21/20 1309  DME 3 n 1  Once        04/21/20 1308           Signed: Cooper Render Aleathia Purdy 04/23/2020, 9:12 AM

## 2020-04-23 NOTE — Progress Notes (Signed)
RN gave report to facility staff, Patient's belonging given to spouse on arrival for transport to facility. Discharged instructions in envelope given to personnel. Patient en route to facility.

## 2020-04-23 NOTE — Progress Notes (Signed)
Postop day 2.  Overall doing very well.  Back pain minimal.  No lower extremity pain.  Participating with therapy.  Plan for discharge to skilled nursing facility today.

## 2020-04-23 NOTE — Progress Notes (Signed)
Physical Therapy Treatment Patient Details Name: [name] [name]. MRN: [id number] DOB: 06-30-1923 Today's Date: 04/23/2020    History of Present Illness Pt is a 86 y.o. M with significant PMH of CA, CKD stage 3, DM type 2, bilateral TKA, R reverse TSA, right 1st ray and 2nd toe amputation who presents with L5-S1 grade 2 unstable spondylolisthesis with severe radiculopathy now s/p bilateral L5-S1 decompressive laminotomies and foraminotomies, L5-S1 PLIF.     PT Comments    Pt progressing well with post-op mobility. He was able to demonstrate transfers and ambulation with up to +2 max assist and RW for support which seems to be an improvement as pt reports he was at a lift level PTA. Pt was educated on precautions, brace application/wearing schedule, appropriate activity progression, and car transfer. Pt anticipates return back to SNF today however we will continue to follow until d/c.      Follow Up Recommendations  SNF     Equipment Recommendations  None recommended by PT    Recommendations for Other Services       Precautions / Restrictions Precautions Precautions: Back;Fall Precaution Booklet Issued: Yes (comment) Precaution Comments: Pt was cued for precautions during functional mobility.  Required Braces or Orthoses: Spinal Brace Spinal Brace: Lumbar corset;Applied in sitting position Restrictions Weight Bearing Restrictions: No    Mobility  Bed Mobility Overal bed mobility: Needs Assistance Bed Mobility: Rolling;Sidelying to Sit Rolling: Mod assist Sidelying to sit: Mod assist;+2 for physical assistance       General bed mobility comments: Assist for proper log roll technique. +2 to elevate trunk to full sitting position.   Transfers Overall transfer level: Needs assistance Equipment used: Rolling walker (2 wheeled) Transfers: Sit to/from Stand Sit to Stand: Max assist;Total assist;+2 physical assistance;+2 safety/equipment;From elevated surface          General transfer comment: +2 assist for power-up to full standing position. VC's for hand placement on seated surface for safety. Pt stood from raised bed height initially. After seated rest break, attempted to stand again from wheelchair height, and pt was unable to achieve full stand even with total assist.   Ambulation/Gait Ambulation/Gait assistance: Mod assist;+2 physical assistance Gait Distance (Feet): 25 Feet Assistive device: Rolling walker (2 wheeled) Gait Pattern/deviations: Decreased stride length;Narrow base of support;Step-to pattern Gait velocity: decreased Gait velocity interpretation: <1.31 ft/sec, indicative of household ambulator General Gait Details: VC's for improved posture, closer walker proximity, and forward gaze. Once he started sequencing correctly, pt was able to gain speed. Fatigued quickly however and noted bedroom shoes were coming off his feet. Pt took a seated rest break to adjust footwear and pt unable to stand again from the wheelchair for a second walk.    Stairs             Wheelchair Mobility    Modified Rankin (Stroke Patients Only)       Balance Overall balance assessment: Needs assistance Sitting-balance support: Feet supported Sitting balance-Leahy Scale: Fair     Standing balance support: Bilateral upper extremity supported Standing balance-Leahy Scale: Poor Standing balance comment: reliant on external support                            Cognition Arousal/Alertness: Awake/alert Behavior During Therapy: WFL for tasks assessed/performed Overall Cognitive Status: Within Functional Limits for tasks assessed  Exercises      General Comments        Pertinent Vitals/Pain Pain Assessment: Faces Faces Pain Scale: Hurts little more Pain Location: surgical site Pain Descriptors / Indicators: Operative site guarding;Grimacing Pain Intervention(s): Limited activity  within patient's tolerance;Monitored during session;Repositioned    Home Living                      Prior Function            PT Goals (current goals can now be found in the care plan section) Acute Rehab PT Goals Patient Stated Goal: Take a nap PT Goal Formulation: With patient Time For Goal Achievement: 05/06/20 Potential to Achieve Goals: Good Progress towards PT goals: Progressing toward goals    Frequency    Min 5X/week      PT Plan Current plan remains appropriate    Co-evaluation              AM-PAC PT "6 Clicks" Mobility   Outcome Measure  Help needed turning from your back to your side while in a flat bed without using bedrails?: A Lot Help needed moving from lying on your back to sitting on the side of a flat bed without using bedrails?: Total Help needed moving to and from a bed to a chair (including a wheelchair)?: A Lot Help needed standing up from a chair using your arms (e.g., wheelchair or bedside chair)?: A Lot Help needed to walk in hospital room?: A Lot Help needed climbing 3-5 steps with a railing? : Total 6 Click Score: 10    End of Session Equipment Utilized During Treatment: Gait belt;Back brace Activity Tolerance: Patient tolerated treatment well Patient left: in chair;with call bell/phone within reach Nurse Communication: Mobility status PT Visit Diagnosis: Pain;Difficulty in walking, not elsewhere classified (R26.2);Muscle weakness (generalized) (M62.81) Pain - part of body:  (back)     Time: 6606-3016 PT Time Calculation (min) (ACUTE ONLY): 24 min  Charges:  $Gait Training: 23-37 mins                     [name] [name], PT, DPT Acute Rehabilitation Services Pager: [phone] Office: [phone]    Thelma Comp 04/23/2020, 2:33 PM

## 2020-04-24 DIAGNOSIS — R2689 Other abnormalities of gait and mobility: Secondary | ICD-10-CM | POA: Diagnosis not present

## 2020-04-24 DIAGNOSIS — M48062 Spinal stenosis, lumbar region with neurogenic claudication: Secondary | ICD-10-CM | POA: Diagnosis not present

## 2020-04-24 DIAGNOSIS — M62561 Muscle wasting and atrophy, not elsewhere classified, right lower leg: Secondary | ICD-10-CM | POA: Diagnosis not present

## 2020-04-24 DIAGNOSIS — Z4789 Encounter for other orthopedic aftercare: Secondary | ICD-10-CM | POA: Diagnosis not present

## 2020-04-24 DIAGNOSIS — R278 Other lack of coordination: Secondary | ICD-10-CM | POA: Diagnosis not present

## 2020-04-24 DIAGNOSIS — M62562 Muscle wasting and atrophy, not elsewhere classified, left lower leg: Secondary | ICD-10-CM | POA: Diagnosis not present

## 2020-04-24 DIAGNOSIS — M4726 Other spondylosis with radiculopathy, lumbar region: Secondary | ICD-10-CM | POA: Diagnosis not present

## 2020-04-27 ENCOUNTER — Non-Acute Institutional Stay (SKILLED_NURSING_FACILITY): Payer: PPO | Admitting: Adult Health

## 2020-04-27 ENCOUNTER — Encounter: Payer: Self-pay | Admitting: Adult Health

## 2020-04-27 DIAGNOSIS — R2689 Other abnormalities of gait and mobility: Secondary | ICD-10-CM | POA: Diagnosis not present

## 2020-04-27 DIAGNOSIS — Z4789 Encounter for other orthopedic aftercare: Secondary | ICD-10-CM | POA: Diagnosis not present

## 2020-04-27 DIAGNOSIS — Z981 Arthrodesis status: Secondary | ICD-10-CM | POA: Diagnosis not present

## 2020-04-27 DIAGNOSIS — M48062 Spinal stenosis, lumbar region with neurogenic claudication: Secondary | ICD-10-CM | POA: Diagnosis not present

## 2020-04-27 DIAGNOSIS — R531 Weakness: Secondary | ICD-10-CM | POA: Diagnosis not present

## 2020-04-27 DIAGNOSIS — R410 Disorientation, unspecified: Secondary | ICD-10-CM

## 2020-04-27 DIAGNOSIS — M4310 Spondylolisthesis, site unspecified: Secondary | ICD-10-CM | POA: Diagnosis not present

## 2020-04-27 DIAGNOSIS — M62562 Muscle wasting and atrophy, not elsewhere classified, left lower leg: Secondary | ICD-10-CM | POA: Diagnosis not present

## 2020-04-27 DIAGNOSIS — R278 Other lack of coordination: Secondary | ICD-10-CM | POA: Diagnosis not present

## 2020-04-27 DIAGNOSIS — R5383 Other fatigue: Secondary | ICD-10-CM | POA: Diagnosis not present

## 2020-04-27 DIAGNOSIS — M62561 Muscle wasting and atrophy, not elsewhere classified, right lower leg: Secondary | ICD-10-CM | POA: Diagnosis not present

## 2020-04-27 DIAGNOSIS — M4726 Other spondylosis with radiculopathy, lumbar region: Secondary | ICD-10-CM | POA: Diagnosis not present

## 2020-04-27 MED FILL — Heparin Sodium (Porcine) Inj 1000 Unit/ML: INTRAMUSCULAR | Qty: 30 | Status: AC

## 2020-04-27 MED FILL — Sodium Chloride IV Soln 0.9%: INTRAVENOUS | Qty: 1000 | Status: AC

## 2020-04-27 NOTE — Progress Notes (Signed)
Location:  Occupational psychologist of Service:  SNF (31) Provider:   Cindi Carbon, ANP [place] [phone]   [name], DO  Patient Care Team: [name], DO as PCP - General (Geriatric Medicine) [name], [name], MD as Referring Physician (Endocrinology) [name], MD as Consulting Physician (Orthopedic Surgery) Bond, Tracie Harrier, MD as Referring Physician (Ophthalmology) Magnus Sinning, MD as Consulting Physician (Physical Medicine and Rehabilitation)  Extended Emergency Contact Information Primary Emergency Contact: [name],[name] Address: [address]          [place], [place] [address] [name] of [place] Phone: [phone] Mobile Phone: [phone] Relation: Son Secondary Emergency Contact: [name] [name], [place] [address] [name] of [place] Phone: [phone] Mobile Phone: [phone] Relation: Spouse  Code Status:  DNR Goals of care: Advanced Directive information Advanced Directives 04/21/2020  Does Patient Have a Medical Advance Directive? Yes  Type of Advance Directive Living will  Does patient want to make changes to medical advance directive? -  Copy of [place] in Chart? -  Would patient like information on creating a medical advance directive? -  Pre-existing out of facility DNR order (yellow form or pink MOST form) -     Chief Complaint  Patient presents with  . Acute Visit    hallucinations     HPI:  Pt is a 86 y.o. male seen today for an acute visit for hallucination. [name] [name] has a hx of chronic back pain/spinal stenosis, degenerative spondylisthesis with leg weakness and is s/p posterior lumbar interbody fusion on 04/21/20.  He currently resides in skilled rehab and is hoping to work with therapy and make gains in order [place]. Prior to surgery he was not able to walk due to  pain and weakness. After surgery he reports that he has been pain free and has not needed ultram. He walked 25 feet in the hospital with PT (2 person max assist) but had difficulty with standing initially. Now he is at [facility] and the nurses report he is not ambulatory due to weakness and continues to need the lift for transfers. She also reports he is intermittently having hallucinations and has periods of lethargy. He is not having any cough, sob, or fever. No urinary symptoms. No issues with constipation or diarrhea. He has a chronically elevated WBC due to prednisone use  WBC 13.4 on 04/21/20.  He is also currently on a medrol dose pack.    Past Medical History:  Diagnosis Date  . Allergic rhinitis    Prior allergy shots 20 years  . Altered mental status    when pain is unmanaged or while on pain meds   . Anemia   . Anemia, B12 deficiency 2000  . Arthritis    Status post left total replacement 8 2011  . BPH (benign prostatic hyperplasia) 2013  . Cancer ([place])    renal cell ca and skin cancer   . CKD (chronic kidney disease)    stage 3  . Colitis 2014  . Diabetes mellitus without complication ([place])    type 2 - no meds  . Diabetic polyneuropathy associated with type 2 diabetes mellitus ([place])   . Difficult intubation 1994   surgery had to be  stopped due to injury to "throat"  . Difficult intubation 1994   no trouble since.  Required nasotracheal intubation  '  02 [facility]; awake intubation 12/15/11  . GERD (gastroesophageal reflux disease)   . Glaucoma 2007   Status post left trabeculectomy 2007  . History of colon polyps    Colonoscopy 2001  . History of kidney stones   . History of renal cell carcinoma 1994   Status post right nephrectomy  . Hyperlipidemia 2003  . Hypertension   . 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 Hypothyroidism (acquired) 2000  . Nocturia   . Osteomyelitis ([place])    right great  . Osteomyelitis ([place])     RIGHT FOOT /TOE  . Peripheral vascular disease ([place])   . Pituitary mass ([place]) 2000   S/p transphenoidal excision 05/1999 ([facility])  . Vitamin D deficiency 2009   Past Surgical History:  Procedure Laterality Date  . AMPUTATION Right 12/20/2017   Procedure: RIGHT 1ST RAY AMPUTATION;  Surgeon: [name], MD;  Location: [facility];  Service: Orthopedics;  Laterality: Right;  . AMPUTATION Right 03/23/2018   Procedure: RIGHT 2ND TOE AMPUTATION;  Surgeon: [name], MD;  Location: [facility];  Service: Orthopedics;  Laterality: Right;  . BRAIN SURGERY  2000   pituatary gland removed .  Marland Kitchen CHOLECYSTECTOMY  1984  . Ectropion surgery Bilateral 2006  . EYE SURGERY  over last 6 yrs.     trabeculectomy...   . EYE SURGERY   cat ext ou  . JOINT REPLACEMENT  2011   knee left  . KNEE ARTHROSCOPY Right 02/06/2015   Procedure: RIGHT ARTHROSCOPY KNEE WITH LATERAL MENSICAL  DEBRIDEMENT;  Surgeon: Gaynelle Arabian, MD;  Location: [facility];  Service: Orthopedics;  Laterality: Right;  . Mohs procedure      for skin cancer on nose   . NEPHRECTOMY Right 1994   Renal cell  . REVERSE SHOULDER ARTHROPLASTY  12/15/2011   Procedure: REVERSE SHOULDER ARTHROPLASTY;  Surgeon: Marin Shutter, MD;  Location: [facility];  Service: Orthopedics;  Laterality: Right;  right total reverse shoulder  . TONSILLECTOMY    . TOTAL KNEE ARTHROPLASTY Right 06/29/2015   Procedure: TOTAL KNEE ARTHROPLASTY;  Surgeon: Gaynelle Arabian, MD;  Location: [facility];  Service: Orthopedics;  Laterality: Right;  . Transsphenoidal excision pituitary tumor  05/1999   A.Tommi Rumps, M.D.([facility])    Allergies  Allergen Reactions  . Adhesive [Tape] Other (See Comments)    CAUSES SKIN TEARS, prefers paper tape   . Lyrica [Pregabalin] Swelling  . Percocet [Oxycodone-Acetaminophen] Other (See Comments)    UNSPECIFIED REACTION  Does not want to take  . Robaxin [Methocarbamol]     UNSPECIFIED REACTION   . Diovan [Valsartan] Rash    Outpatient Encounter  Medications as of 04/27/2020  Medication Sig  . acetaminophen (TYLENOL) 500 MG tablet Take 1,000 mg by mouth 3 (three) times daily as needed for moderate pain.   . Cholecalciferol (VITAMIN D) 2000 units tablet Take 2,000 Units by mouth daily.  . Coenzyme Q10 300 MG CAPS Take 300 mg by mouth daily.   . Diclofenac Sodium 1.5 % SOLN Dispense 10 to 40 drops to affected area on skin as directed, twice per day  . erythromycin ophthalmic ointment 1 application at bedtime. (Patient not taking: Reported on 04/15/2020)  . finasteride (PROSCAR) 5 MG tablet Take 5 mg by mouth daily.   . Flaxseed, Linseed, (EQL FLAXSEED OIL) 1200 MG CAPS Take 1,200 mg by mouth daily.  (Patient not taking: Reported on 04/15/2020)  . Flaxseed, Linseed, (FLAXSEED OIL) 1000 MG CAPS Take  1,000 mg by mouth daily.  . fluticasone (FLONASE) 50 MCG/ACT nasal spray Place 2 sprays into both nostrils daily.  . methylPREDNISolone (MEDROL DOSEPAK) 4 MG TBPK tablet follow package directions  . mirabegron ER (MYRBETRIQ) 25 MG TB24 tablet Take 25 mg by mouth every other day.  . Multiple Vitamins-Minerals (MULTIVITAMIN & MINERAL PO) Take 1 tablet by mouth daily.  . NON FORMULARY 1 application 4 (four) times daily as needed. CBD cream apply to affected area 4 x day as needed (Patient not taking: Reported on 04/15/2020)  . Omega-3 Fatty Acids (OMEGA-3 FISH OIL PO) Take 700 mg by mouth daily. (Patient not taking: Reported on 04/15/2020)  . polyethylene glycol (MIRALAX / GLYCOLAX) 17 g packet Take 17 g by mouth daily.  . pravastatin (PRAVACHOL) 80 MG tablet Take 80 mg by mouth every other day.   . predniSONE (DELTASONE) 5 MG tablet Take 2.5-5 mg by mouth See admin instructions. Take 5 mg by mouth in the morning and 2.5 mg in the evening  . SYNTHROID 88 MCG tablet Take 88 mcg by mouth daily before breakfast.   . testosterone cypionate (DEPOTESTOSTERONE CYPIONATE) 200 MG/ML injection Inject 30 mg into the muscle every Friday.   . timolol (TIMOPTIC) 0.5 %  ophthalmic solution Place 1 drop into the right eye daily.  . traMADol (ULTRAM) 50 MG tablet Take 0.5-1 tablets (25-50 mg total) by mouth every 6 (six) hours as needed. 25 for mild to moderate pain, 50 for severe pain  . vitamin B-12 (CYANOCOBALAMIN) 500 MCG tablet Take 500 mcg by mouth daily.   No facility-administered encounter medications on file as of 04/27/2020.    Review of Systems  Constitutional: Positive for activity change and fatigue. Negative for appetite change, chills, diaphoresis, fever and unexpected weight change.  Respiratory: Negative for cough, shortness of breath, wheezing and stridor.   Cardiovascular: Positive for leg swelling (chronic ). Negative for chest pain and palpitations.  Gastrointestinal: Negative for abdominal distention, abdominal pain, constipation and diarrhea.  Genitourinary: Negative for difficulty urinating and dysuria.  Musculoskeletal: Positive for gait problem. Negative for arthralgias, back pain, joint swelling and myalgias.  Skin: Positive for wound (pressure area to heel).  Neurological: Negative for dizziness, seizures, syncope, facial asymmetry, speech difficulty, weakness and headaches.  Hematological: Negative for adenopathy. Does not bruise/bleed easily.  Psychiatric/Behavioral: Positive for confusion and hallucinations. Negative for agitation, behavioral problems, dysphoric mood, self-injury and sleep disturbance. The patient is not nervous/anxious and is not hyperactive.     Immunization History  Administered Date(s) Administered  . Fluad Quad(high Dose 65+) 04/30/2019  . Influenza, High Dose Seasonal PF 04/30/2017, 06/08/2018  . Influenza, Quadrivalent, Recombinant, Inj, Pf 05/19/2016  . Moderna SARS-COVID-2 Vaccination 09/10/2019, 10/08/2019  . Pneumococcal Conjugate-13 06/04/2015  . Pneumococcal Polysaccharide-23 06/29/2010  . Tdap 10/28/1998, 06/08/2018  . Zoster 10/27/2005  . Zoster Recombinat (Shingrix) 02/04/2018, 07/02/2018    Pertinent  Health Maintenance Due  Topic Date Due  . URINE MICROALBUMIN  Never done  . HEMOGLOBIN A1C  09/20/2018  . INFLUENZA VACCINE  03/29/2020  . OPHTHALMOLOGY EXAM  06/04/2020  . FOOT EXAM  02/09/2021  . PNA vac Low Risk Adult  Completed   Fall Risk  07/24/2019 07/17/2019 07/05/2019 03/13/2019 11/14/2018  Falls in the past year? 0 0 0 0 1  Comment Emmi Telephone Survey: data to providers prior to load - - - -  Number falls in past yr: - 0 0 0 0  Comment - - - - -  Injury with  Fall? - 0 0 0 1  Risk Factor Category  - - - - -  Risk for fall due to : - - - - -   Functional Status Survey:    Vitals:   04/27/20 1611  BP: 140/72  Pulse: 70  Resp: 16  Temp: 97.7 F (36.5 C)  SpO2: 93%   There is no height or weight on file to calculate BMI. Physical Exam Vitals and nursing note reviewed.  Constitutional:      General: He is not in acute distress.    Appearance: He is not diaphoretic.     Comments: Slightly groggy on exam   HENT:     Head: Normocephalic and atraumatic.  Neck:     Thyroid: No thyromegaly.     Vascular: No JVD.     Trachea: No tracheal deviation.  Cardiovascular:     Rate and Rhythm: Normal rate and regular rhythm.     Heart sounds: No murmur heard.   Pulmonary:     Effort: Pulmonary effort is normal. No respiratory distress.     Breath sounds: Normal breath sounds. No wheezing.  Abdominal:     General: Bowel sounds are normal. There is no distension.     Palpations: Abdomen is soft.     Tenderness: There is no abdominal tenderness.  Musculoskeletal:        General: No swelling, tenderness, deformity or signs of injury.     Cervical back: Normal range of motion and neck supple.     Comments: Trace BLE edema   Lymphadenopathy:     Cervical: No cervical adenopathy.  Skin:    General: Skin is warm and dry.     Comments: Lumbar dressing CDI. No tenderness to the spine. MAE. Strength 4/5 BUE, 3/5 BLE   Neurological:     General: No focal deficit  present.     Mental Status: He is oriented to person, place, and time.     Cranial Nerves: No cranial nerve deficit.  Psychiatric:        Mood and Affect: Mood normal.     Labs reviewed: Recent Labs    08/29/19 0000 02/11/20 0000  NA 139 136*  K 3.9  --   CL 101  --   CO2 33* 27*  BUN 25* 19  CREATININE 1.2 0.9  CALCIUM 9.4 9.3   Recent Labs    08/29/19 0000  ALBUMIN 3.6   Recent Labs    08/29/19 0000 02/11/20 0000 04/21/20 0628  WBC 11.3 10.5 13.4*  NEUTROABS  --   --  8.6*  HGB 12.9* 11.1* 13.4  HCT 39* 34* 42.5  MCV  --   --  102.2*  PLT 233 275 289   Lab Results  Component Value Date   TSH <0.010 (L) 03/20/2018   Lab Results  Component Value Date   HGBA1C 6.5 (H) 03/20/2018   Lab Results  Component Value Date   CHOL 138 08/29/2019   HDL 2 (A) 08/29/2019   LDLCALC 61 08/29/2019   TRIG 72 08/29/2019    Significant Diagnostic Results in last 30 days:  DG Lumbar Spine 2-3 Views  Result Date: 04/21/2020 CLINICAL DATA:  Posterior lumbar interbody fusion. EXAM: LUMBAR SPINE - 2-3 VIEW; DG C-ARM 1-60 MIN COMPARISON:  Lumbar radiographs 04/08/2020. FINDINGS: Two nondiagnostic fluoroscopic images demonstrate postsurgical changes related to bilateral L5-S1 posterior fusion with interbody graft. Similar alignment with L5-S1 anterolisthesis. 24.6 seconds of fluoro time with 6.85 mGy dose. IMPRESSION: Intraoperative  fluoroscopic guidance for lumbar spine fusion. Electronically Signed   By: Margaretha Sheffield MD   On: 04/21/2020 11:03   MR LUMBAR SPINE WO CONTRAST  Result Date: 04/02/2020 CLINICAL DATA:  Lumbar stenosis with neurogenic claudication EXAM: MRI LUMBAR SPINE WITHOUT CONTRAST TECHNIQUE: Multiplanar, multisequence MR imaging of the lumbar spine was performed. No intravenous contrast was administered. COMPARISON:  11/16/2018 FINDINGS: Segmentation:  Standard lumbar number Alignment:  Grade 1 anterolisthesis at L5-S1, facet mediated Vertebrae: Marrow edema in  the L5 body with a horizontal hypointense fracture plane following the mildly depressed inferior endplate. No paravertebral edema. No evidence of bone lesion. Remote L2 superior endplate fracture. Conus medullaris and cauda equina: Conus extends to the L1 level. Conus and cauda equina appear normal. Paraspinal and other soft tissues: Right nephrectomy and left renal cystic intensity with lobulation. Atrophy of intrinsic back muscles Disc levels: T12- L1: Unremarkable. L1-L2: Ventral spondylitic spurring.  No impingement L2-L3: Unremarkable. L3-L4: Unremarkable. L4-L5: Disc narrowing and bulging. Degenerative facet hypertrophy and ligamentum flavum thickening. Noncompressive right more than left foraminal narrowing. Bilateral subarticular recess stenosis that could affect the L5 nerve roots. L5-S1:Severe facet osteoarthritis with spurring and anterolisthesis. The disc is narrowed and bulging with biforaminal L5 compression, progressed. Bilateral subarticular recess stenosis that is greater on the right. IMPRESSION: 1. L5 compression fracture, likely subacute, with mild inferior endplate depression. 2. L4-5 and L5-S1 predominant degenerative disease with L5-S1 chronic anterolisthesis. 3. L5-S1 severe biforaminal impingement which has progressed from 2020. 4. L4-5 bilateral subarticular recess stenosis that could affect the L5 nerve roots. Electronically Signed   By: Monte Fantasia M.D.   On: 04/02/2020 05:32   DG C-Arm 1-60 Min  Result Date: 04/21/2020 CLINICAL DATA:  Posterior lumbar interbody fusion. EXAM: LUMBAR SPINE - 2-3 VIEW; DG C-ARM 1-60 MIN COMPARISON:  Lumbar radiographs 04/08/2020. FINDINGS: Two nondiagnostic fluoroscopic images demonstrate postsurgical changes related to bilateral L5-S1 posterior fusion with interbody graft. Similar alignment with L5-S1 anterolisthesis. 24.6 seconds of fluoro time with 6.85 mGy dose. IMPRESSION: Intraoperative fluoroscopic guidance for lumbar spine fusion.  Electronically Signed   By: Margaretha Sheffield MD   On: 04/21/2020 11:03    Assessment/Plan 1. Delirium Mild with intermittent lethargy and hallucinations.  Likely due to anesthesia and medrol/prednisone use. He will be calling Dr. Annette Stable to report this issue. We are ordering labs to rule out dehydration. At this time he has no s/s of infection. Recommend IS use 10 breaths QID x 2 weeks. He only has 3 more days of the medrol, will complete this course.   2. Degenerative spondylolisthesis S/p lumbar fusion, now pain free.  3. Status post lumbar spinal fusion Continue as needed ultram and tylenol use PT and OT eval and treat as indicated Continue lumbar brace as indicated.  F/U with surgery next week   4. Weakness This was present before due to immobility with chronic pain. Will continue to work with therapy in hopes of progression with ambulation. His goal would be due live in [place] and have some independence. He has signed for skilled care at this time.    Family/ staff Communication: discussed with Mr. Penick and his nurse Amber  Labs/tests ordered: CBC BMP

## 2020-04-27 NOTE — Progress Notes (Signed)
Location:  Occupational psychologist of Service:  SNF (31) Provider:  [name], DO  Patient Care Team: [name], DO as PCP - General (Geriatric Medicine) [name], [name], MD as Referring Physician (Endocrinology) [name], MD as Consulting Physician (Orthopedic Surgery) Bond, Tracie Harrier, MD as Referring Physician (Ophthalmology) Magnus Sinning, MD as Consulting Physician (Physical Medicine and Rehabilitation)  Extended Emergency Contact Information Primary Emergency Contact: Drouillard,[name] Address: [address]          [place], [place] [address] [name] of [place] Phone: [phone] Mobile Phone: [phone] Relation: Son Secondary Emergency Contact: [name], [place] [address] [name] of [place] Phone: [phone] Mobile Phone: [phone] Relation: Spouse  Code Status:   Goals of care: Advanced Directive information Advanced Directives 04/21/2020  Does Patient Have a Medical Advance Directive? Yes  Type of Advance Directive Living will  Does patient want to make changes to medical advance directive? -  Copy of [place] in Chart? -  Would patient like information on creating a medical advance directive? -  Pre-existing out of facility DNR order (yellow form or pink MOST form) -     Chief Complaint  Patient presents with  . Acute Visit    delirium    HPI:  Pt is a 86 y.o. male seen today for a hospital f/u s/p admission from  Past Medical History:  Diagnosis Date  . Allergic rhinitis    Prior allergy shots 20 years  . Altered mental status    when pain is unmanaged or while on pain meds   . Anemia   . Anemia, B12 deficiency 2000  . Arthritis    Status post left total replacement 8 2011  . BPH (benign prostatic hyperplasia) 2013  . Cancer ([place])    renal cell ca and skin cancer   . CKD (chronic kidney disease)     stage 3  . Colitis 2014  . Diabetes mellitus without complication ([place])    type 2 - no meds  . Diabetic polyneuropathy associated with type 2 diabetes mellitus ([place])   . Difficult intubation 1994   surgery had to be  stopped due to injury to "throat"  . Difficult intubation 1994   no trouble since.  Required nasotracheal intubation  '02 [facility]; awake intubation 12/15/11  . GERD (gastroesophageal reflux disease)   . Glaucoma 2007   Status post left trabeculectomy 2007  . History of colon polyps    Colonoscopy 2001  . History of kidney stones   . History of renal cell carcinoma 1994   Status post right nephrectomy  . Hyperlipidemia 2003  . Hypertension   . 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 Hypothyroidism (acquired) 2000  . Nocturia   . Osteomyelitis ([place])    right great  . Osteomyelitis ([place])    RIGHT FOOT /TOE  . Peripheral vascular disease ([place])   . Pituitary mass ([place]) 2000   S/p transphenoidal excision 05/1999 ([facility])  . Vitamin D deficiency 2009   Past Surgical History:  Procedure Laterality Date  . AMPUTATION Right 12/20/2017   Procedure: RIGHT 1ST RAY AMPUTATION;  Surgeon: [name], MD;  Location: [place];  Service: Orthopedics;  Laterality: Right;  . AMPUTATION Right 03/23/2018   Procedure: RIGHT 2ND TOE AMPUTATION;  Surgeon: [name], MD;  Location[facility]  OR;  Service: Orthopedics;  Laterality: Right;  . BRAIN SURGERY  2000   pituatary gland removed .  Marland Kitchen CHOLECYSTECTOMY  1984  . Ectropion surgery Bilateral 2006  . EYE SURGERY  over last 6 yrs.     trabeculectomy...   . EYE SURGERY   cat ext ou  . JOINT REPLACEMENT  2011   knee left  . KNEE ARTHROSCOPY Right 02/06/2015   Procedure: RIGHT ARTHROSCOPY KNEE WITH LATERAL MENSICAL  DEBRIDEMENT;  Surgeon: Gaynelle Arabian, MD;  Location: [facility];  Service: Orthopedics;  Laterality: Right;  . Mohs procedure      for skin cancer on nose   . NEPHRECTOMY Right 1994    Renal cell  . REVERSE SHOULDER ARTHROPLASTY  12/15/2011   Procedure: REVERSE SHOULDER ARTHROPLASTY;  Surgeon: Marin Shutter, MD;  Location: [place];  Service: Orthopedics;  Laterality: Right;  right total reverse shoulder  . TONSILLECTOMY    . TOTAL KNEE ARTHROPLASTY Right 06/29/2015   Procedure: TOTAL KNEE ARTHROPLASTY;  Surgeon: Gaynelle Arabian, MD;  Location: [facility];  Service: Orthopedics;  Laterality: Right;  . Transsphenoidal excision pituitary tumor  05/1999   A.Tommi Rumps, M.D.([facility])    Allergies  Allergen Reactions  . Adhesive [Tape] Other (See Comments)    CAUSES SKIN TEARS, prefers paper tape   . Lyrica [Pregabalin] Swelling  . Percocet [Oxycodone-Acetaminophen] Other (See Comments)    UNSPECIFIED REACTION  Does not want to take  . Robaxin [Methocarbamol]     UNSPECIFIED REACTION   . Diovan [Valsartan] Rash    Outpatient Encounter Medications as of 04/27/2020  Medication Sig  . acetaminophen (TYLENOL) 500 MG tablet Take 1,000 mg by mouth 3 (three) times daily as needed for moderate pain.   . Cholecalciferol (VITAMIN D) 2000 units tablet Take 2,000 Units by mouth daily.  . Coenzyme Q10 300 MG CAPS Take 300 mg by mouth daily.   . Diclofenac Sodium 1.5 % SOLN Dispense 10 to 40 drops to affected area on skin as directed, twice per day  . erythromycin ophthalmic ointment 1 application at bedtime. (Patient not taking: Reported on 04/15/2020)  . finasteride (PROSCAR) 5 MG tablet Take 5 mg by mouth daily.   . Flaxseed, Linseed, (EQL FLAXSEED OIL) 1200 MG CAPS Take 1,200 mg by mouth daily.  (Patient not taking: Reported on 04/15/2020)  . Flaxseed, Linseed, (FLAXSEED OIL) 1000 MG CAPS Take 1,000 mg by mouth daily.  . fluticasone (FLONASE) 50 MCG/ACT nasal spray Place 2 sprays into both nostrils daily.  . methylPREDNISolone (MEDROL DOSEPAK) 4 MG TBPK tablet follow package directions  . mirabegron ER (MYRBETRIQ) 25 MG TB24 tablet Take 25 mg by mouth every other day.  . Multiple  Vitamins-Minerals (MULTIVITAMIN & MINERAL PO) Take 1 tablet by mouth daily.  . NON FORMULARY 1 application 4 (four) times daily as needed. CBD cream apply to affected area 4 x day as needed (Patient not taking: Reported on 04/15/2020)  . Omega-3 Fatty Acids (OMEGA-3 FISH OIL PO) Take 700 mg by mouth daily. (Patient not taking: Reported on 04/15/2020)  . polyethylene glycol (MIRALAX / GLYCOLAX) 17 g packet Take 17 g by mouth daily.  . pravastatin (PRAVACHOL) 80 MG tablet Take 80 mg by mouth every other day.   . predniSONE (DELTASONE) 5 MG tablet Take 2.5-5 mg by mouth See admin instructions. Take 5 mg by mouth in the morning and 2.5 mg in the evening  . SYNTHROID 88 MCG tablet Take 88 mcg by mouth daily before breakfast.   .  testosterone cypionate (DEPOTESTOSTERONE CYPIONATE) 200 MG/ML injection Inject 30 mg into the muscle every Friday.   . timolol (TIMOPTIC) 0.5 % ophthalmic solution Place 1 drop into the right eye daily.  . traMADol (ULTRAM) 50 MG tablet Take 0.5-1 tablets (25-50 mg total) by mouth every 6 (six) hours as needed. 25 for mild to moderate pain, 50 for severe pain  . vitamin B-12 (CYANOCOBALAMIN) 500 MCG tablet Take 500 mcg by mouth daily.   No facility-administered encounter medications on file as of 04/27/2020.    Review of Systems  Immunization History  Administered Date(s) Administered  . Fluad Quad(high Dose 65+) 04/30/2019  . Influenza, High Dose Seasonal PF 04/30/2017, 06/08/2018  . Influenza, Quadrivalent, Recombinant, Inj, Pf 05/19/2016  . Moderna SARS-COVID-2 Vaccination 09/10/2019, 10/08/2019  . Pneumococcal Conjugate-13 06/04/2015  . Pneumococcal Polysaccharide-23 06/29/2010  . Tdap 10/28/1998, 06/08/2018  . Zoster 10/27/2005  . Zoster Recombinat (Shingrix) 02/04/2018, 07/02/2018   Pertinent  Health Maintenance Due  Topic Date Due  . URINE MICROALBUMIN  Never done  . HEMOGLOBIN A1C  09/20/2018  . INFLUENZA VACCINE  03/29/2020  . OPHTHALMOLOGY EXAM  06/04/2020   . FOOT EXAM  02/09/2021  . PNA vac Low Risk Adult  Completed   Fall Risk  07/24/2019 07/17/2019 07/05/2019 03/13/2019 11/14/2018  Falls in the past year? 0 0 0 0 1  Comment Emmi Telephone Survey: data to providers prior to load - - - -  Number falls in past yr: - 0 0 0 0  Comment - - - - -  Injury with Fall? - 0 0 0 1  Risk Factor Category  - - - - -  Risk for fall due to : - - - - -   Functional Status Survey:    Vitals:   04/27/20 1019  BP: 140/72  Pulse: 70  Resp: 16  Temp: 97.7 F (36.5 C)  SpO2: 93%  Weight: 172 lb (78 kg)   Body mass index is 23.99 kg/m. Physical Exam  Labs reviewed: Recent Labs    08/29/19 0000 02/11/20 0000  NA 139 136*  K 3.9  --   CL 101  --   CO2 33* 27*  BUN 25* 19  CREATININE 1.2 0.9  CALCIUM 9.4 9.3   Recent Labs    08/29/19 0000  ALBUMIN 3.6   Recent Labs    08/29/19 0000 02/11/20 0000 04/21/20 0628  WBC 11.3 10.5 13.4*  NEUTROABS  --   --  8.6*  HGB 12.9* 11.1* 13.4  HCT 39* 34* 42.5  MCV  --   --  102.2*  PLT 233 275 289   Lab Results  Component Value Date   TSH <0.010 (L) 03/20/2018   Lab Results  Component Value Date   HGBA1C 6.5 (H) 03/20/2018   Lab Results  Component Value Date   CHOL 138 08/29/2019   HDL 2 (A) 08/29/2019   LDLCALC 61 08/29/2019   TRIG 72 08/29/2019    Significant Diagnostic Results in last 30 days:  DG Lumbar Spine 2-3 Views  Result Date: 04/21/2020 CLINICAL DATA:  Posterior lumbar interbody fusion. EXAM: LUMBAR SPINE - 2-3 VIEW; DG C-ARM 1-60 MIN COMPARISON:  Lumbar radiographs 04/08/2020. FINDINGS: Two nondiagnostic fluoroscopic images demonstrate postsurgical changes related to bilateral L5-S1 posterior fusion with interbody graft. Similar alignment with L5-S1 anterolisthesis. 24.6 seconds of fluoro time with 6.85 mGy dose. IMPRESSION: Intraoperative fluoroscopic guidance for lumbar spine fusion. Electronically Signed   By: Margaretha Sheffield MD   On: 04/21/2020  11:03   DG C-Arm  1-60 Min  Result Date: 04/21/2020 CLINICAL DATA:  Posterior lumbar interbody fusion. EXAM: LUMBAR SPINE - 2-3 VIEW; DG C-ARM 1-60 MIN COMPARISON:  Lumbar radiographs 04/08/2020. FINDINGS: Two nondiagnostic fluoroscopic images demonstrate postsurgical changes related to bilateral L5-S1 posterior fusion with interbody graft. Similar alignment with L5-S1 anterolisthesis. 24.6 seconds of fluoro time with 6.85 mGy dose. IMPRESSION: Intraoperative fluoroscopic guidance for lumbar spine fusion. Electronically Signed   By: Margaretha Sheffield MD   On: 04/21/2020 11:03    Assessment/Plan There are no diagnoses linked to this encounter.   Family/ staff Communication:   Labs/tests ordered:     This encounter was created in error - please disregard.

## 2020-04-28 ENCOUNTER — Non-Acute Institutional Stay (SKILLED_NURSING_FACILITY): Payer: PPO | Admitting: Internal Medicine

## 2020-04-28 ENCOUNTER — Encounter: Payer: Self-pay | Admitting: Internal Medicine

## 2020-04-28 DIAGNOSIS — M48062 Spinal stenosis, lumbar region with neurogenic claudication: Secondary | ICD-10-CM | POA: Diagnosis not present

## 2020-04-28 DIAGNOSIS — M62561 Muscle wasting and atrophy, not elsewhere classified, right lower leg: Secondary | ICD-10-CM | POA: Diagnosis not present

## 2020-04-28 DIAGNOSIS — M6389 Disorders of muscle in diseases classified elsewhere, multiple sites: Secondary | ICD-10-CM | POA: Diagnosis not present

## 2020-04-28 DIAGNOSIS — R2689 Other abnormalities of gait and mobility: Secondary | ICD-10-CM | POA: Diagnosis not present

## 2020-04-28 DIAGNOSIS — G4751 Confusional arousals: Secondary | ICD-10-CM | POA: Diagnosis not present

## 2020-04-28 DIAGNOSIS — Z981 Arthrodesis status: Secondary | ICD-10-CM | POA: Diagnosis not present

## 2020-04-28 DIAGNOSIS — Z66 Do not resuscitate: Secondary | ICD-10-CM

## 2020-04-28 DIAGNOSIS — J690 Pneumonitis due to inhalation of food and vomit: Secondary | ICD-10-CM | POA: Diagnosis not present

## 2020-04-28 DIAGNOSIS — E2740 Unspecified adrenocortical insufficiency: Secondary | ICD-10-CM | POA: Diagnosis not present

## 2020-04-28 DIAGNOSIS — R410 Disorientation, unspecified: Secondary | ICD-10-CM | POA: Diagnosis not present

## 2020-04-28 DIAGNOSIS — M4726 Other spondylosis with radiculopathy, lumbar region: Secondary | ICD-10-CM | POA: Diagnosis not present

## 2020-04-28 DIAGNOSIS — Z4789 Encounter for other orthopedic aftercare: Secondary | ICD-10-CM | POA: Diagnosis not present

## 2020-04-28 DIAGNOSIS — R4182 Altered mental status, unspecified: Secondary | ICD-10-CM | POA: Diagnosis not present

## 2020-04-28 DIAGNOSIS — R278 Other lack of coordination: Secondary | ICD-10-CM | POA: Diagnosis not present

## 2020-04-28 DIAGNOSIS — L89619 Pressure ulcer of right heel, unspecified stage: Secondary | ICD-10-CM

## 2020-04-28 DIAGNOSIS — M62562 Muscle wasting and atrophy, not elsewhere classified, left lower leg: Secondary | ICD-10-CM | POA: Diagnosis not present

## 2020-04-28 DIAGNOSIS — R531 Weakness: Secondary | ICD-10-CM

## 2020-04-28 NOTE — Progress Notes (Signed)
Provider:  Rexene Edison. Mariea Clonts, D.O., C.M.D. Location:  Occupational psychologist of Service:   SNF  PCP: [name], DO Patient Care Team: [name], DO as PCP - General (Geriatric Medicine) Community, [name], [name], MD as Referring Physician (Endocrinology) [name], MD as Consulting Physician (Orthopedic Surgery) Bond, Tracie Harrier, MD as Referring Physician (Ophthalmology) Magnus Sinning, MD as Consulting Physician (Physical Medicine and Rehabilitation)  Extended Emergency Contact Information Primary Emergency Contact: Werth,[name] Address: [address]          [place], [place] [address] [name] of [place] Phone: [phone] Mobile Phone: [phone] Relation: Son Secondary Emergency Contact: [name], [place] [address] [name] of [place] Phone: [phone] Mobile Phone: [phone] Relation: Spouse  Code Status: DNR Goals of Care: Advanced Directive information Advanced Directives 04/28/2020  Does Patient Have a Medical Advance Directive? Yes  Type of Advance Directive Living will;Out of facility DNR (pink MOST or yellow form)  Does patient want to make changes to medical advance directive? No - Guardian declined  Copy of [place] in Chart? -  Would patient like information on creating a medical advance directive? -  Pre-existing out of facility DNR order (yellow form or pink MOST form) Pink MOST/Yellow Form most recent copy in chart - Physician notified to receive inpatient order      Chief Complaint  Patient presents with  . Readmit To SNF    Readmit   . Acute Visit    Pneumonia     HPI: Patient is a 86 y.o. male with h/o panhypopituitarism, adrenal insufficiency, chronic low back pain with radiculopathy, difficulty with hyponatremia and challenges maintaining hydration seen today for readmission to [facility] s/p elective  admission for posterior lumbar interbody fusion L5-S1 due to grade 2 unstable lumbar spondylolisthesis with severe radiculopathy.  His pain could not be controlled with medications and physical therapy prior to the surgery.  Dr. Annette Stable performed the neurosurgery on 04/21/20.  He returned to [place] afterward on medrol dose pack (due to adrenal insufficiency), tramadol, tylenol for pain.  He is now having more difficulty standing postop.  He's been delirious and had wbc elevated (on steroids).  Full set of labs was ordered and chest xray.  WBC had actually improved a bit, but CXR revealed right lower lobe infiltrate and small pleural effusion felt to be infectious in etiology.  (?aspiration in postop period).  These results returned this am and I just started him on augmentin.    When seen today, he reported some coughing.  He'd had a low grade temp once of 99.2.  He was not in a good mood and frustrated by the hallucinations he'd had overnight from taking the tramadol and from the other pain medications he'd received while hospitalized.  He reports moving his bowels well.  His pain in his legs that he had preop is gone.  He now has pain from bruising and positioning during surgery (and of course the procedure itself).  He is using some ice at times for this.  He had been on the phone with his wife asking her to bring him a couple of items from their home and was holding his phone backwards.    Past Medical History:  Diagnosis Date  . Allergic rhinitis    Prior allergy shots 20 years  . Altered mental status    when pain is unmanaged  or while on pain meds   . Anemia   . Anemia, B12 deficiency 2000  . Arthritis    Status post left total replacement 8 2011  . BPH (benign prostatic hyperplasia) 2013  . Cancer ([place])    renal cell ca and skin cancer   . CKD (chronic kidney disease)    stage 3  . Colitis 2014  . Diabetes mellitus without complication ([place])    type 2 - no meds  . Diabetic polyneuropathy  associated with type 2 diabetes mellitus ([place])   . Difficult intubation 1994   surgery had to be  stopped due to injury to "throat"  . Difficult intubation 1994   no trouble since.  Required nasotracheal intubation  '02 [facility]; awake intubation 12/15/11  . GERD (gastroesophageal reflux disease)   . Glaucoma 2007   Status post left trabeculectomy 2007  . History of colon polyps    Colonoscopy 2001  . History of kidney stones   . History of renal cell carcinoma 1994   Status post right nephrectomy  . Hyperlipidemia 2003  . Hypertension   . 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 Hypothyroidism (acquired) 2000  . Nocturia   . Osteomyelitis ([place])    right great  . Osteomyelitis ([place])    RIGHT FOOT /TOE  . Peripheral vascular disease ([place])   . Pituitary mass ([place]) 2000   S/p transphenoidal excision 05/1999 ([facility])  . Vitamin D deficiency 2009   Past Surgical History:  Procedure Laterality Date  . AMPUTATION Right 12/20/2017   Procedure: RIGHT 1ST RAY AMPUTATION;  Surgeon: [name], MD;  Location: [facility];  Service: Orthopedics;  Laterality: Right;  . AMPUTATION Right 03/23/2018   Procedure: RIGHT 2ND TOE AMPUTATION;  Surgeon: [name], MD;  Location: [facility];  Service: Orthopedics;  Laterality: Right;  . BRAIN SURGERY  2000   pituatary gland removed .  Marland Kitchen CHOLECYSTECTOMY  1984  . Ectropion surgery Bilateral 2006  . EYE SURGERY  over last 6 yrs.     trabeculectomy...   . EYE SURGERY   cat ext ou  . JOINT REPLACEMENT  2011   knee left  . KNEE ARTHROSCOPY Right 02/06/2015   Procedure: RIGHT ARTHROSCOPY KNEE WITH LATERAL MENSICAL  DEBRIDEMENT;  Surgeon: Gaynelle Arabian, MD;  Location: [facility];  Service: Orthopedics;  Laterality: Right;  . Mohs procedure      for skin cancer on nose   . NEPHRECTOMY Right 1994   Renal cell  . REVERSE SHOULDER ARTHROPLASTY  12/15/2011   Procedure: REVERSE SHOULDER ARTHROPLASTY;  Surgeon: Marin Shutter, MD;  Location: [facility];  Service: Orthopedics;  Laterality: Right;  right total reverse shoulder  . TONSILLECTOMY    . TOTAL KNEE ARTHROPLASTY Right 06/29/2015   Procedure: TOTAL KNEE ARTHROPLASTY;  Surgeon: Gaynelle Arabian, MD;  Location: [facility];  Service: Orthopedics;  Laterality: Right;  . Transsphenoidal excision pituitary tumor  05/1999   A.Tommi Rumps, M.D.([facility])    Social History   Socioeconomic History  . Marital status: Widowed    Spouse name: Not on file  . Number of children: Not on file  . Years of education: Not on file  . Highest education level: Not on file  Occupational History  . Not on file  Tobacco Use  . Smoking status: Former Smoker    Types: Pipe    Quit date: 09/26/1988    Years since quitting: 31.6  . Smokeless  tobacco: Never Used  . Tobacco comment: about 20 years  Vaping Use  . Vaping Use: Never used  Substance and Sexual Activity  . Alcohol use: Yes    Comment: 2-3 drinks per week liquor  . Drug use: No  . Sexual activity: Not Currently  Other Topics Concern  . Not on file  Social History Narrative   Patient is Married Software engineer). Retired Engineer, mining) Administrator, sports. Lives in apartment, Independent Living section at [facility] since 2000.  Spouse lives in skilled nursing section in same community   Smoking 1994 , moderate alcohol use: 2 drinks a night. Exercises regularly with walking and exercise classes. Continues to drive   Patient has Advanced planning documents: Living Will               Social Determinants of Health   Financial Resource Strain:   . Difficulty of Paying Living Expenses: Not on file  Food Insecurity:   . Worried About Charity fundraiser in the Last Year: Not on file  . Ran Out of Food in the Last Year: Not on file  Transportation Needs:   . Lack of Transportation (Medical): Not on file  . Lack of Transportation (Non-Medical): Not on file  Physical Activity:   . Days of Exercise  per Week: Not on file  . Minutes of Exercise per Session: Not on file  Stress:   . Feeling of Stress : Not on file  Social Connections:   . Frequency of Communication with Friends and Family: Not on file  . Frequency of Social Gatherings with Friends and Family: Not on file  . Attends Religious Services: Not on file  . Active Member of Clubs or Organizations: Not on file  . Attends Archivist Meetings: Not on file  . Marital Status: Not on file    reports that he quit smoking about 31 years ago. His smoking use included pipe. He has never used smokeless tobacco. He reports current alcohol use. He reports that he does not use drugs.  Functional Status Survey:    Family History  Problem Relation Age of Onset  . Lung cancer Father   . Anesthesia problems Neg Hx   . Hypotension Neg Hx   . Malignant hyperthermia Neg Hx   . Pseudochol deficiency Neg Hx     Health Maintenance  Topic Date Due  . URINE MICROALBUMIN  Never done  . HEMOGLOBIN A1C  09/20/2018  . INFLUENZA VACCINE  03/29/2020  . OPHTHALMOLOGY EXAM  06/04/2020  . FOOT EXAM  02/09/2021  . TETANUS/TDAP  06/08/2028  . COVID-19 Vaccine  Completed  . PNA vac Low Risk Adult  Completed    Allergies  Allergen Reactions  . Adhesive [Tape] Other (See Comments)    CAUSES SKIN TEARS, prefers paper tape   . Lyrica [Pregabalin] Swelling  . Percocet [Oxycodone-Acetaminophen] Other (See Comments)    UNSPECIFIED REACTION  Does not want to take  . Robaxin [Methocarbamol]     UNSPECIFIED REACTION   . Diovan [Valsartan] Rash    Outpatient Encounter Medications as of 04/28/2020  Medication Sig  . acetaminophen (TYLENOL) 500 MG tablet Take 1,000 mg by mouth 3 (three) times daily as needed for moderate pain.   Marland Kitchen amoxicillin-clavulanate (AUGMENTIN) 875-125 MG tablet Take 1 tablet by mouth 2 (two) times daily. Special instructions: Augmentin 875-125 mg PO Q 12 hours x 10 days 1st dose 1:30pm on 04/28/20. Last dose @@ 9pm on  05/07/2020 twice a  day  . Cholecalciferol (VITAMIN D) 2000 units tablet Take 2,000 Units by mouth daily.  . Coenzyme Q10 300 MG CAPS Take 300 mg by mouth daily.   . Diclofenac Sodium 1.5 % SOLN Dispense 10 to 40 drops to affected area on skin as directed, twice per day  . finasteride (PROSCAR) 5 MG tablet Take 5 mg by mouth daily.   . Flaxseed, Linseed, (FLAXSEED OIL) 1000 MG CAPS Take 1,000 mg by mouth daily.  . methylPREDNISolone (MEDROL DOSEPAK) 4 MG TBPK tablet follow package directions  . mirabegron ER (MYRBETRIQ) 25 MG TB24 tablet Take 25 mg by mouth every other day.  . Multiple Vitamins-Minerals (MULTIVITAMIN & MINERAL PO) Take 1 tablet by mouth daily.  . polyethylene glycol (MIRALAX / GLYCOLAX) 17 g packet Take 17 g by mouth daily.  . pravastatin (PRAVACHOL) 80 MG tablet Take 80 mg by mouth every other day.   . predniSONE (DELTASONE) 5 MG tablet Take 2.5-5 mg by mouth See admin instructions. Take 5 mg by mouth in the morning and 2.5 mg in the evening  . SYNTHROID 88 MCG tablet Take 88 mcg by mouth daily before breakfast.   . testosterone cypionate (DEPOTESTOSTERONE CYPIONATE) 200 MG/ML injection Inject 30 mg into the muscle every Friday.   . timolol (TIMOPTIC) 0.5 % ophthalmic solution Place 1 drop into the right eye daily.  . traMADol (ULTRAM) 50 MG tablet Take 0.5-1 tablets (25-50 mg total) by mouth every 6 (six) hours as needed. 25 for mild to moderate pain, 50 for severe pain  . vitamin B-12 (CYANOCOBALAMIN) 500 MCG tablet Take 500 mcg by mouth daily.  . [DISCONTINUED] Flaxseed, Linseed, (EQL FLAXSEED OIL) 1200 MG CAPS Take 1,200 mg by mouth daily.   . fluticasone (FLONASE) 50 MCG/ACT nasal spray Place 2 sprays into both nostrils daily.  . [DISCONTINUED] erythromycin ophthalmic ointment 1 application at bedtime. (Patient not taking: Reported on 04/15/2020)  . [DISCONTINUED] NON FORMULARY 1 application 4 (four) times daily as needed. CBD cream apply to affected area 4 x day as needed  (Patient not taking: Reported on 04/15/2020)  . [DISCONTINUED] Omega-3 Fatty Acids (OMEGA-3 FISH OIL PO) Take 700 mg by mouth daily. (Patient not taking: Reported on 04/15/2020)   No facility-administered encounter medications on file as of 04/28/2020.    Review of Systems  Constitutional: Positive for malaise/fatigue. Negative for chills and fever.  HENT: Positive for hearing loss. Negative for congestion and sore throat.   Eyes: Negative for blurred vision.       Glasses (not wearing)  Respiratory: Positive for cough. Negative for sputum production and shortness of breath.        Reports cough but did not cough the whole 30 mins I was in the room  Cardiovascular: Negative for chest pain, palpitations and leg swelling.  Gastrointestinal: Negative for abdominal pain, blood in stool, constipation and melena.  Genitourinary: Negative for dysuria.  Musculoskeletal: Positive for back pain and myalgias. Negative for falls and joint pain.  Skin: Negative for itching and rash.  Neurological: Positive for weakness. Negative for dizziness, tingling, sensory change and loss of consciousness.       Having challenges standing up and working with PT on this   Endo/Heme/Allergies: Bruises/bleeds easily.  Psychiatric/Behavioral: Positive for hallucinations and memory loss. Negative for depression. The patient is not nervous/anxious and does not have insomnia.     Vitals:   04/28/20 1557  BP: 135/62  Pulse: 74  Temp: 99 F (37.2 C)  SpO2: 96%  Weight: 172 lb (78 kg)  Height: 5\' 11"  (1.803 m)   Body mass index is 23.99 kg/m. Physical Exam Vitals reviewed.  Constitutional:      General: He is not in acute distress.    Appearance: He is not toxic-appearing.  HENT:     Head: Normocephalic and atraumatic.     Right Ear: External ear normal.     Left Ear: External ear normal.     Ears:     Comments: HOH, talks over me several times     Nose: Nose normal.     Mouth/Throat:     Pharynx:  Oropharynx is clear.  Eyes:     Comments: Not wearing glasses  Cardiovascular:     Rate and Rhythm: Normal rate and regular rhythm.  Pulmonary:     Comments: Anterior exam was unremarkable Abdominal:     General: Bowel sounds are normal.     Palpations: Abdomen is soft.     Tenderness: There is no abdominal tenderness.  Musculoskeletal:        General: Normal range of motion.     Cervical back: Neck supple.     Right lower leg: No edema.     Left lower leg: No edema.     Comments: Shows me how he can lift his legs  Skin:    General: Skin is warm and dry.  Neurological:     General: No focal deficit present.     Mental Status: He is alert and oriented to person, place, and time.     Motor: Weakness present.     Gait: Gait abnormal.  Psychiatric:     Comments: In irritable mood     Labs reviewed: Basic Metabolic Panel: Recent Labs    08/29/19 0000 02/11/20 0000  NA 139 136*  K 3.9  --   CL 101  --   CO2 33* 27*  BUN 25* 19  CREATININE 1.2 0.9  CALCIUM 9.4 9.3   Liver Function Tests: Recent Labs    08/29/19 0000  ALBUMIN 3.6   No results for input(s): LIPASE, AMYLASE in the last 8760 hours. No results for input(s): AMMONIA in the last 8760 hours. CBC: Recent Labs    08/29/19 0000 02/11/20 0000 04/21/20 0628  WBC 11.3 10.5 13.4*  NEUTROABS  --   --  8.6*  HGB 12.9* 11.1* 13.4  HCT 39* 34* 42.5  MCV  --   --  102.2*  PLT 233 275 289   Cardiac Enzymes: No results for input(s): CKTOTAL, CKMB, CKMBINDEX, TROPONINI in the last 8760 hours. BNP: Invalid input(s): POCBNP Lab Results  Component Value Date   HGBA1C 6.5 (H) 03/20/2018   Lab Results  Component Value Date   TSH <0.010 (L) 03/20/2018   Lab Results  Component Value Date   VITAMINB12 1,321 06/12/2017   No results found for: FOLATE No results found for: IRON, TIBC, FERRITIN  Imaging and Procedures obtained prior to SNF admission: DG Lumbar Spine 2-3 Views  Result Date:  04/21/2020 CLINICAL DATA:  Posterior lumbar interbody fusion. EXAM: LUMBAR SPINE - 2-3 VIEW; DG C-ARM 1-60 MIN COMPARISON:  Lumbar radiographs 04/08/2020. FINDINGS: Two nondiagnostic fluoroscopic images demonstrate postsurgical changes related to bilateral L5-S1 posterior fusion with interbody graft. Similar alignment with L5-S1 anterolisthesis. 24.6 seconds of fluoro time with 6.85 mGy dose. IMPRESSION: Intraoperative fluoroscopic guidance for lumbar spine fusion. Electronically Signed   By: Margaretha Sheffield MD   On: 04/21/2020 11:03   DG C-Arm 1-60 Min  Result Date: 04/21/2020 CLINICAL DATA:  Posterior lumbar interbody fusion. EXAM: LUMBAR SPINE - 2-3 VIEW; DG C-ARM 1-60 MIN COMPARISON:  Lumbar radiographs 04/08/2020. FINDINGS: Two nondiagnostic fluoroscopic images demonstrate postsurgical changes related to bilateral L5-S1 posterior fusion with interbody graft. Similar alignment with L5-S1 anterolisthesis. 24.6 seconds of fluoro time with 6.85 mGy dose. IMPRESSION: Intraoperative fluoroscopic guidance for lumbar spine fusion. Electronically Signed   By: Margaretha Sheffield MD   On: 04/21/2020 11:03    Assessment/Plan 1. Aspiration pneumonia of right lower lobe, unspecified aspiration pneumonia type ([place]) -treating with augmentin  -suspect occurred postop due to pain meds/sedation--he's still got slurred/dysarthric speech today -aspiration precautions  2. Delirium -not fully resolved as of yet -cont tylenol and avoiding tramadol to help it clear -pain seems controlled with this and ice use  3. Adrenal insufficiency ([facility]) -finishing off dose pack and then will be back on usual steroids  4. Status post lumbar spinal fusion -here for PT, OT and working on standing up which is challenging right now  5. Weakness -again, working with PT, OT  6. DNR (do not resuscitate) - Do not attempt resuscitation (DNR)  Has right heel pressure injury that is healing up.He had said it was not open but  nursing confirms it was, and is nearly resolved.  Family/ staff Communication: discussed with snf nurse  Labs/tests ordered:  No new added today, reviewed results from delirium workup  [name] [name] L. America Sandall, D.O. [facility] [address], [place] [address] Cell Phone (Mon-Fri 8am-5pm):  [phone] On Call:  [phone] & follow prompts after 5pm & weekends Office Phone:  [phone] Office Fax:  [phone]

## 2020-04-29 DIAGNOSIS — M48062 Spinal stenosis, lumbar region with neurogenic claudication: Secondary | ICD-10-CM | POA: Diagnosis not present

## 2020-04-29 DIAGNOSIS — M4726 Other spondylosis with radiculopathy, lumbar region: Secondary | ICD-10-CM | POA: Diagnosis not present

## 2020-04-29 DIAGNOSIS — R278 Other lack of coordination: Secondary | ICD-10-CM | POA: Diagnosis not present

## 2020-04-29 DIAGNOSIS — Z4789 Encounter for other orthopedic aftercare: Secondary | ICD-10-CM | POA: Diagnosis not present

## 2020-04-29 DIAGNOSIS — M62562 Muscle wasting and atrophy, not elsewhere classified, left lower leg: Secondary | ICD-10-CM | POA: Diagnosis not present

## 2020-04-29 DIAGNOSIS — R2689 Other abnormalities of gait and mobility: Secondary | ICD-10-CM | POA: Diagnosis not present

## 2020-04-29 DIAGNOSIS — M6389 Disorders of muscle in diseases classified elsewhere, multiple sites: Secondary | ICD-10-CM | POA: Diagnosis not present

## 2020-04-29 DIAGNOSIS — M62561 Muscle wasting and atrophy, not elsewhere classified, right lower leg: Secondary | ICD-10-CM | POA: Diagnosis not present

## 2020-04-30 DIAGNOSIS — M4726 Other spondylosis with radiculopathy, lumbar region: Secondary | ICD-10-CM | POA: Diagnosis not present

## 2020-04-30 DIAGNOSIS — M6389 Disorders of muscle in diseases classified elsewhere, multiple sites: Secondary | ICD-10-CM | POA: Diagnosis not present

## 2020-04-30 DIAGNOSIS — M62561 Muscle wasting and atrophy, not elsewhere classified, right lower leg: Secondary | ICD-10-CM | POA: Diagnosis not present

## 2020-04-30 DIAGNOSIS — Z4789 Encounter for other orthopedic aftercare: Secondary | ICD-10-CM | POA: Diagnosis not present

## 2020-04-30 DIAGNOSIS — R2689 Other abnormalities of gait and mobility: Secondary | ICD-10-CM | POA: Diagnosis not present

## 2020-04-30 DIAGNOSIS — M62562 Muscle wasting and atrophy, not elsewhere classified, left lower leg: Secondary | ICD-10-CM | POA: Diagnosis not present

## 2020-04-30 DIAGNOSIS — R278 Other lack of coordination: Secondary | ICD-10-CM | POA: Diagnosis not present

## 2020-04-30 DIAGNOSIS — M48062 Spinal stenosis, lumbar region with neurogenic claudication: Secondary | ICD-10-CM | POA: Diagnosis not present

## 2020-05-01 DIAGNOSIS — R2689 Other abnormalities of gait and mobility: Secondary | ICD-10-CM | POA: Diagnosis not present

## 2020-05-01 DIAGNOSIS — M48062 Spinal stenosis, lumbar region with neurogenic claudication: Secondary | ICD-10-CM | POA: Diagnosis not present

## 2020-05-01 DIAGNOSIS — M6389 Disorders of muscle in diseases classified elsewhere, multiple sites: Secondary | ICD-10-CM | POA: Diagnosis not present

## 2020-05-01 DIAGNOSIS — M62561 Muscle wasting and atrophy, not elsewhere classified, right lower leg: Secondary | ICD-10-CM | POA: Diagnosis not present

## 2020-05-01 DIAGNOSIS — R278 Other lack of coordination: Secondary | ICD-10-CM | POA: Diagnosis not present

## 2020-05-01 DIAGNOSIS — Z4789 Encounter for other orthopedic aftercare: Secondary | ICD-10-CM | POA: Diagnosis not present

## 2020-05-01 DIAGNOSIS — M62562 Muscle wasting and atrophy, not elsewhere classified, left lower leg: Secondary | ICD-10-CM | POA: Diagnosis not present

## 2020-05-01 DIAGNOSIS — M4726 Other spondylosis with radiculopathy, lumbar region: Secondary | ICD-10-CM | POA: Diagnosis not present

## 2020-05-04 DIAGNOSIS — Z4789 Encounter for other orthopedic aftercare: Secondary | ICD-10-CM | POA: Diagnosis not present

## 2020-05-04 DIAGNOSIS — R278 Other lack of coordination: Secondary | ICD-10-CM | POA: Diagnosis not present

## 2020-05-04 DIAGNOSIS — M4726 Other spondylosis with radiculopathy, lumbar region: Secondary | ICD-10-CM | POA: Diagnosis not present

## 2020-05-04 DIAGNOSIS — R2689 Other abnormalities of gait and mobility: Secondary | ICD-10-CM | POA: Diagnosis not present

## 2020-05-04 DIAGNOSIS — M62561 Muscle wasting and atrophy, not elsewhere classified, right lower leg: Secondary | ICD-10-CM | POA: Diagnosis not present

## 2020-05-04 DIAGNOSIS — J1289 Other viral pneumonia: Secondary | ICD-10-CM | POA: Diagnosis not present

## 2020-05-04 DIAGNOSIS — M62562 Muscle wasting and atrophy, not elsewhere classified, left lower leg: Secondary | ICD-10-CM | POA: Diagnosis not present

## 2020-05-04 DIAGNOSIS — M48062 Spinal stenosis, lumbar region with neurogenic claudication: Secondary | ICD-10-CM | POA: Diagnosis not present

## 2020-05-04 LAB — CBC AND DIFFERENTIAL
HCT: 33 — AB (ref 41–53)
HCT: 33 — AB (ref 41–53)
Hemoglobin: 10.7 — AB (ref 13.5–17.5)
Hemoglobin: 10.7 — AB (ref 13.5–17.5)
Neutrophils Absolute: 12
Platelets: 237 (ref 150–399)
Platelets: 237 (ref 150–399)
WBC: 15.6
WBC: 15.6

## 2020-05-04 LAB — BASIC METABOLIC PANEL
BUN: 21 (ref 4–21)
BUN: 21 (ref 4–21)
CO2: 28 — AB (ref 13–22)
CO2: 28 — AB (ref 13–22)
Chloride: 91 — AB (ref 99–108)
Chloride: 91 — AB (ref 99–108)
Creatinine: 1.1 (ref 0.6–1.3)
Creatinine: 1.1 (ref 0.6–1.3)
Glucose: 133
Glucose: 133
Potassium: 4.3 (ref 3.4–5.3)
Potassium: 4.3 (ref 3.4–5.3)
Sodium: 128 — AB (ref 137–147)
Sodium: 128 — AB (ref 137–147)

## 2020-05-04 LAB — COMPREHENSIVE METABOLIC PANEL
Calcium: 9 (ref 8.7–10.7)
Calcium: 9 (ref 8.7–10.7)

## 2020-05-04 LAB — CBC
RBC: 3.26 — AB (ref 3.87–5.11)
RBC: 3.26 — AB (ref 3.87–5.11)

## 2020-05-05 DIAGNOSIS — M4726 Other spondylosis with radiculopathy, lumbar region: Secondary | ICD-10-CM | POA: Diagnosis not present

## 2020-05-05 DIAGNOSIS — R2689 Other abnormalities of gait and mobility: Secondary | ICD-10-CM | POA: Diagnosis not present

## 2020-05-05 DIAGNOSIS — M62561 Muscle wasting and atrophy, not elsewhere classified, right lower leg: Secondary | ICD-10-CM | POA: Diagnosis not present

## 2020-05-05 DIAGNOSIS — R278 Other lack of coordination: Secondary | ICD-10-CM | POA: Diagnosis not present

## 2020-05-05 DIAGNOSIS — M62562 Muscle wasting and atrophy, not elsewhere classified, left lower leg: Secondary | ICD-10-CM | POA: Diagnosis not present

## 2020-05-05 DIAGNOSIS — Z4789 Encounter for other orthopedic aftercare: Secondary | ICD-10-CM | POA: Diagnosis not present

## 2020-05-05 DIAGNOSIS — J1289 Other viral pneumonia: Secondary | ICD-10-CM | POA: Diagnosis not present

## 2020-05-05 DIAGNOSIS — M48062 Spinal stenosis, lumbar region with neurogenic claudication: Secondary | ICD-10-CM | POA: Diagnosis not present

## 2020-05-05 LAB — CBC AND DIFFERENTIAL
HCT: 32 — AB (ref 41–53)
Hemoglobin: 10.8 — AB (ref 13.5–17.5)
Neutrophils Absolute: 6
Platelets: 250 (ref 150–399)
WBC: 9.9

## 2020-05-05 LAB — CBC: RBC: 3.25 — AB (ref 3.87–5.11)

## 2020-05-06 DIAGNOSIS — Z4789 Encounter for other orthopedic aftercare: Secondary | ICD-10-CM | POA: Diagnosis not present

## 2020-05-06 DIAGNOSIS — R2689 Other abnormalities of gait and mobility: Secondary | ICD-10-CM | POA: Diagnosis not present

## 2020-05-06 DIAGNOSIS — R278 Other lack of coordination: Secondary | ICD-10-CM | POA: Diagnosis not present

## 2020-05-06 DIAGNOSIS — M62562 Muscle wasting and atrophy, not elsewhere classified, left lower leg: Secondary | ICD-10-CM | POA: Diagnosis not present

## 2020-05-06 DIAGNOSIS — M6389 Disorders of muscle in diseases classified elsewhere, multiple sites: Secondary | ICD-10-CM | POA: Diagnosis not present

## 2020-05-06 DIAGNOSIS — M4726 Other spondylosis with radiculopathy, lumbar region: Secondary | ICD-10-CM | POA: Diagnosis not present

## 2020-05-06 DIAGNOSIS — M62561 Muscle wasting and atrophy, not elsewhere classified, right lower leg: Secondary | ICD-10-CM | POA: Diagnosis not present

## 2020-05-06 DIAGNOSIS — M48062 Spinal stenosis, lumbar region with neurogenic claudication: Secondary | ICD-10-CM | POA: Diagnosis not present

## 2020-05-07 ENCOUNTER — Encounter: Payer: Self-pay | Admitting: Adult Health

## 2020-05-07 ENCOUNTER — Non-Acute Institutional Stay (SKILLED_NURSING_FACILITY): Payer: PPO | Admitting: Adult Health

## 2020-05-07 DIAGNOSIS — M4726 Other spondylosis with radiculopathy, lumbar region: Secondary | ICD-10-CM | POA: Diagnosis not present

## 2020-05-07 DIAGNOSIS — Z981 Arthrodesis status: Secondary | ICD-10-CM | POA: Diagnosis not present

## 2020-05-07 DIAGNOSIS — M48062 Spinal stenosis, lumbar region with neurogenic claudication: Secondary | ICD-10-CM | POA: Diagnosis not present

## 2020-05-07 DIAGNOSIS — R2689 Other abnormalities of gait and mobility: Secondary | ICD-10-CM | POA: Diagnosis not present

## 2020-05-07 DIAGNOSIS — E871 Hypo-osmolality and hyponatremia: Secondary | ICD-10-CM

## 2020-05-07 DIAGNOSIS — M62562 Muscle wasting and atrophy, not elsewhere classified, left lower leg: Secondary | ICD-10-CM | POA: Diagnosis not present

## 2020-05-07 DIAGNOSIS — L03032 Cellulitis of left toe: Secondary | ICD-10-CM | POA: Diagnosis not present

## 2020-05-07 DIAGNOSIS — D72829 Elevated white blood cell count, unspecified: Secondary | ICD-10-CM

## 2020-05-07 DIAGNOSIS — M62561 Muscle wasting and atrophy, not elsewhere classified, right lower leg: Secondary | ICD-10-CM | POA: Diagnosis not present

## 2020-05-07 DIAGNOSIS — J690 Pneumonitis due to inhalation of food and vomit: Secondary | ICD-10-CM | POA: Diagnosis not present

## 2020-05-07 DIAGNOSIS — M6389 Disorders of muscle in diseases classified elsewhere, multiple sites: Secondary | ICD-10-CM | POA: Diagnosis not present

## 2020-05-07 DIAGNOSIS — Z4789 Encounter for other orthopedic aftercare: Secondary | ICD-10-CM | POA: Diagnosis not present

## 2020-05-07 DIAGNOSIS — R410 Disorientation, unspecified: Secondary | ICD-10-CM

## 2020-05-07 DIAGNOSIS — R278 Other lack of coordination: Secondary | ICD-10-CM | POA: Diagnosis not present

## 2020-05-07 NOTE — Progress Notes (Signed)
Location:  Occupational psychologist of Service:  SNF (31) Provider:   Cindi Carbon, ANP [place] [phone]   [name], DO  Patient Care Team: [name], DO as PCP - General (Geriatric Medicine) Community, [name], [name], MD as Referring Physician (Endocrinology) [name], MD as Consulting Physician (Orthopedic Surgery) Bond, Tracie Harrier, MD as Referring Physician (Ophthalmology) Magnus Sinning, MD as Consulting Physician (Physical Medicine and Rehabilitation)  Extended Emergency Contact Information Primary Emergency Contact: Castrogiovanni,[name] Address: [address]          [place], [place] [address] [name] of [place] Phone: [phone] Mobile Phone: [phone] Relation: Son Secondary Emergency Contact: [name],  [address] [name] of [place] Phone: [phone] Mobile Phone: [phone] Relation: Spouse  Code Status:  DNR Goals of care: Advanced Directive information Advanced Directives 04/28/2020  Does Patient Have a Medical Advance Directive? Yes  Type of Advance Directive Living will;Out of facility DNR (pink MOST or yellow form)  Does patient want to make changes to medical advance directive? No - Guardian declined  Copy of [place] in Chart? -  Would patient like information on creating a medical advance directive? -  Pre-existing out of facility DNR order (yellow form or pink MOST form) Pink MOST/Yellow Form most recent copy in chart - Physician notified to receive inpatient order     Chief Complaint  Patient presents with  . Acute Visit    left toe pustule    HPI:  Pt is a 86 y.o. male seen today for an acute visit for left great toe redness and pustule.  [name] is s/p lumbar fusion on 04/21/20 and is currently residing in the rehab section [facility]. He is walking short distances with therapy but is  slow to progress. His pain is much improved since surgery and he is not using tylenol or ultram.  On 04/28/20 a CXR was ordered due to delirium and elevated WBC (which is chronic due to prednisone use), and the xray showed RLL pna. He was placed on Augmentin and took 3 days of therapy but changed to Doxycycline on 9/4 for an additional 7 days of treatment due to diarrhea. On 9/6 a CBC and BMP were ordered due to lethargy and weakness. NA was 128 and at that time the patient was not eating and drinking well. Now the nurse reports that his intake has improved. He is voiding and having bowel movements. WBC was 15.6 with ANC 12.1,  Recheck on 9/7 showed WBC 9.9 and ANC 5.9.  He is more alert now and no longer lethargic. However, since surgery he has had periods of confusion and hallucinations thought to be due to anesthesia along with the pneumonia.  He is no longer coughing, sats 91% and has no sob. The left toe pustule was first reported to me today. He has a hx of cellulitis and osteomyelitis of the lower ext in the past, as well as right toe amputation.    Past Medical History:  Diagnosis Date  . Allergic rhinitis    Prior allergy shots 20 years  . Altered mental status    when pain is unmanaged or while on pain meds   . Anemia   . Anemia, B12 deficiency 2000  . Arthritis    Status post left total replacement 8 2011  . BPH (benign prostatic hyperplasia) 2013  .  Cancer ([place])    renal cell ca and skin cancer   . CKD (chronic kidney disease)    stage 3  . Colitis 2014  . Diabetes mellitus without complication ([place])    type 2 - no meds  . Diabetic polyneuropathy associated with type 2 diabetes mellitus ([place])   . Difficult intubation 1994   surgery had to be  stopped due to injury to "throat"  . Difficult intubation 1994   no trouble since.  Required nasotracheal intubation  '02 [facility]; awake intubation 12/15/11  . GERD (gastroesophageal reflux disease)   . Glaucoma 2007   Status post left  trabeculectomy 2007  . History of colon polyps    Colonoscopy 2001  . History of kidney stones   . History of renal cell carcinoma 1994   Status post right nephrectomy  . Hyperlipidemia 2003  . Hypertension   . 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 Hypothyroidism (acquired) 2000  . Nocturia   . Osteomyelitis ([place])    right great  . Osteomyelitis ([place])    RIGHT FOOT /TOE  . Peripheral vascular disease ([place])   . Pituitary mass ([name]) 2000   S/p transphenoidal excision 05/1999 ([facility])  . Vitamin D deficiency 2009   Past Surgical History:  Procedure Laterality Date  . AMPUTATION Right 12/20/2017   Procedure: RIGHT 1ST RAY AMPUTATION;  Surgeon: [name], MD;  Location: [place];  Service: Orthopedics;  Laterality: Right;  . AMPUTATION Right 03/23/2018   Procedure: RIGHT 2ND TOE AMPUTATION;  Surgeon: [name], MD;  Location: [place];  Service: Orthopedics;  Laterality: Right;  . BRAIN SURGERY  2000   pituatary gland removed .  Marland Kitchen CHOLECYSTECTOMY  1984  . Ectropion surgery Bilateral 2006  . EYE SURGERY  over last 6 yrs.     trabeculectomy...   . EYE SURGERY   cat ext ou  . JOINT REPLACEMENT  2011   knee left  . KNEE ARTHROSCOPY Right 02/06/2015   Procedure: RIGHT ARTHROSCOPY KNEE WITH LATERAL MENSICAL  DEBRIDEMENT;  Surgeon: Gaynelle Arabian, MD;  Location: [facility];  Service: Orthopedics;  Laterality: Right;  . Mohs procedure      for skin cancer on nose   . NEPHRECTOMY Right 1994   Renal cell  . REVERSE SHOULDER ARTHROPLASTY  12/15/2011   Procedure: REVERSE SHOULDER ARTHROPLASTY;  Surgeon: Marin Shutter, MD;  Location: [place];  Service: Orthopedics;  Laterality: Right;  right total reverse shoulder  . TONSILLECTOMY    . TOTAL KNEE ARTHROPLASTY Right 06/29/2015   Procedure: TOTAL KNEE ARTHROPLASTY;  Surgeon: Gaynelle Arabian, MD;  Location: [facility];  Service: Orthopedics;  Laterality: Right;  . Transsphenoidal excision pituitary  tumor  05/1999   A.Tommi Rumps, M.D.([facility])    Allergies  Allergen Reactions  . Adhesive [Tape] Other (See Comments)    CAUSES SKIN TEARS, prefers paper tape   . Lyrica [Pregabalin] Swelling  . Percocet [Oxycodone-Acetaminophen] Other (See Comments)    UNSPECIFIED REACTION  Does not want to take  . Robaxin [Methocarbamol]     UNSPECIFIED REACTION   . Diovan [Valsartan] Rash    Outpatient Encounter Medications as of 05/07/2020  Medication Sig  . Diclofenac Sodium 1.5 % SOLN Dispense 10 to 40 drops to affected area on skin as directed, twice per day  . doxycycline (DORYX) 100 MG EC tablet Take 100 mg by mouth 2 (two) times daily.  Marland Kitchen saccharomyces boulardii (FLORASTOR) 250 MG capsule  Take 250 mg by mouth 2 (two) times daily.  Marland Kitchen acetaminophen (TYLENOL) 500 MG tablet Take 1,000 mg by mouth 3 (three) times daily as needed for moderate pain.   . Cholecalciferol (VITAMIN D) 2000 units tablet Take 2,000 Units by mouth daily.  . Coenzyme Q10 300 MG CAPS Take 300 mg by mouth daily.   . finasteride (PROSCAR) 5 MG tablet Take 5 mg by mouth daily.   . Flaxseed, Linseed, (FLAXSEED OIL) 1000 MG CAPS Take 1,000 mg by mouth daily.  . fluticasone (FLONASE) 50 MCG/ACT nasal spray Place 2 sprays into both nostrils daily.  . mirabegron ER (MYRBETRIQ) 25 MG TB24 tablet Take 25 mg by mouth every other day.  . Multiple Vitamins-Minerals (MULTIVITAMIN & MINERAL PO) Take 1 tablet by mouth daily.  . polyethylene glycol (MIRALAX / GLYCOLAX) 17 g packet Take 17 g by mouth daily.  . pravastatin (PRAVACHOL) 80 MG tablet Take 80 mg by mouth every other day.   . predniSONE (DELTASONE) 5 MG tablet Take 2.5-5 mg by mouth See admin instructions. Take 5 mg by mouth in the morning and 2.5 mg in the evening  . SYNTHROID 88 MCG tablet Take 88 mcg by mouth daily before breakfast.   . testosterone cypionate (DEPOTESTOSTERONE CYPIONATE) 200 MG/ML injection Inject 30 mg into the muscle every Friday.   . timolol (TIMOPTIC) 0.5 %  ophthalmic solution Place 1 drop into the right eye daily.  . traMADol (ULTRAM) 50 MG tablet Take 0.5-1 tablets (25-50 mg total) by mouth every 6 (six) hours as needed. 25 for mild to moderate pain, 50 for severe pain  . vitamin B-12 (CYANOCOBALAMIN) 500 MCG tablet Take 500 mcg by mouth daily.  . [DISCONTINUED] amoxicillin-clavulanate (AUGMENTIN) 875-125 MG tablet Take 1 tablet by mouth 2 (two) times daily. Special instructions: Augmentin 875-125 mg PO Q 12 hours x 10 days 1st dose 1:30pm on 04/28/20. Last dose @@ 9pm on 05/07/2020 twice a day  . [DISCONTINUED] methylPREDNISolone (MEDROL DOSEPAK) 4 MG TBPK tablet follow package directions   No facility-administered encounter medications on file as of 05/07/2020.    Review of Systems  Constitutional: Negative for activity change, appetite change, chills, diaphoresis, fatigue, fever and unexpected weight change.  Respiratory: Negative for cough, shortness of breath, wheezing and stridor.   Cardiovascular: Positive for leg swelling. Negative for chest pain and palpitations.  Gastrointestinal: Negative for abdominal distention, abdominal pain, constipation and diarrhea.  Genitourinary: Negative for difficulty urinating and dysuria.  Musculoskeletal: Positive for gait problem. Negative for arthralgias, back pain, joint swelling and myalgias.  Skin: Positive for color change and wound.  Neurological: Negative for dizziness, seizures, syncope, facial asymmetry, speech difficulty, weakness and headaches.  Hematological: Negative for adenopathy. Does not bruise/bleed easily.  Psychiatric/Behavioral: Positive for confusion and hallucinations. Negative for agitation and behavioral problems.    Immunization History  Administered Date(s) Administered  . Fluad Quad(high Dose 65+) 04/30/2019  . Influenza, High Dose Seasonal PF 04/30/2017, 06/08/2018  . Influenza, Quadrivalent, Recombinant, Inj, Pf 05/19/2016  . Moderna SARS-COVID-2 Vaccination 09/10/2019,  10/08/2019  . Pneumococcal Conjugate-13 06/04/2015  . Pneumococcal Polysaccharide-23 06/29/2010  . Tdap 10/28/1998, 06/08/2018  . Zoster 10/27/2005  . Zoster Recombinat (Shingrix) 02/04/2018, 07/02/2018   Pertinent  Health Maintenance Due  Topic Date Due  . URINE MICROALBUMIN  Never done  . HEMOGLOBIN A1C  09/20/2018  . INFLUENZA VACCINE  03/29/2020  . OPHTHALMOLOGY EXAM  06/04/2020  . FOOT EXAM  02/09/2021  . PNA vac Low Risk Adult  Completed  Fall Risk  07/24/2019 07/17/2019 07/05/2019 03/13/2019 11/14/2018  Falls in the past year? 0 0 0 0 1  Comment Emmi Telephone Survey: data to providers prior to load - - - -  Number falls in past yr: - 0 0 0 0  Comment - - - - -  Injury with Fall? - 0 0 0 1  Risk Factor Category  - - - - -  Risk for fall due to : - - - - -   Functional Status Survey:    Vitals:   05/07/20 1129  BP: (!) 160/80  Pulse: 78  Resp: 18  Temp: (!) 97.4 F (36.3 C)  SpO2: 91%   There is no height or weight on file to calculate BMI. Physical Exam Vitals and nursing note reviewed.  Constitutional:      General: He is not in acute distress.    Appearance: He is not diaphoretic.     Comments: Alert when entering the room but falls asleep when talking to me   HENT:     Head: Normocephalic and atraumatic.  Neck:     Thyroid: No thyromegaly.     Vascular: No JVD.     Trachea: No tracheal deviation.  Cardiovascular:     Rate and Rhythm: Normal rate and regular rhythm.     Heart sounds: No murmur heard.   Pulmonary:     Effort: Pulmonary effort is normal. No respiratory distress.     Breath sounds: No wheezing.     Comments: Decreased bases  Abdominal:     General: Bowel sounds are normal. There is no distension.     Palpations: Abdomen is soft.     Tenderness: There is no abdominal tenderness.  Musculoskeletal:     Cervical back: Normal range of motion and neck supple.     Comments: BLE edema +1.    Lymphadenopathy:     Cervical: No cervical  adenopathy.  Skin:    General: Skin is warm and dry.     Comments: Left great toe with erythema and mild swelling, Pustule 1cm noted to anterior left great toe   Neurological:     Mental Status: He is oriented to person, place, and time.     Cranial Nerves: No cranial nerve deficit.     Comments: Oriented x 3 at times other times confused about the details of his care and forgets location      Labs reviewed: Recent Labs    08/29/19 0000 02/11/20 0000 05/04/20 0000  NA 139 136* 128*  K 3.9  --  4.3  CL 101  --  91*  CO2 33* 27* 28*  BUN 25* 19 21  CREATININE 1.2 0.9 1.1  CALCIUM 9.4 9.3 9.0   Recent Labs    08/29/19 0000  ALBUMIN 3.6   Recent Labs    02/11/20 0000 04/21/20 0628 05/04/20 0000  WBC 10.5 13.4* 15.6  NEUTROABS  --  8.6* 12  HGB 11.1* 13.4 10.7*  HCT 34* 42.5 33*  MCV  --  102.2*  --   PLT 275 289 237   Lab Results  Component Value Date   TSH <0.010 (L) 03/20/2018   Lab Results  Component Value Date   HGBA1C 6.5 (H) 03/20/2018   Lab Results  Component Value Date   CHOL 138 08/29/2019   HDL 2 (A) 08/29/2019   LDLCALC 61 08/29/2019   TRIG 72 08/29/2019    Significant Diagnostic Results in last 30 days:  DG  Lumbar Spine 2-3 Views  Result Date: 04/21/2020 CLINICAL DATA:  Posterior lumbar interbody fusion. EXAM: LUMBAR SPINE - 2-3 VIEW; DG C-ARM 1-60 MIN COMPARISON:  Lumbar radiographs 04/08/2020. FINDINGS: Two nondiagnostic fluoroscopic images demonstrate postsurgical changes related to bilateral L5-S1 posterior fusion with interbody graft. Similar alignment with L5-S1 anterolisthesis. 24.6 seconds of fluoro time with 6.85 mGy dose. IMPRESSION: Intraoperative fluoroscopic guidance for lumbar spine fusion. Electronically Signed   By: Margaretha Sheffield MD   On: 04/21/2020 11:03   DG C-Arm 1-60 Min  Result Date: 04/21/2020 CLINICAL DATA:  Posterior lumbar interbody fusion. EXAM: LUMBAR SPINE - 2-3 VIEW; DG C-ARM 1-60 MIN COMPARISON:  Lumbar  radiographs 04/08/2020. FINDINGS: Two nondiagnostic fluoroscopic images demonstrate postsurgical changes related to bilateral L5-S1 posterior fusion with interbody graft. Similar alignment with L5-S1 anterolisthesis. 24.6 seconds of fluoro time with 6.85 mGy dose. IMPRESSION: Intraoperative fluoroscopic guidance for lumbar spine fusion. Electronically Signed   By: Margaretha Sheffield MD   On: 04/21/2020 11:03    Assessment/Plan 1. Aspiration pneumonia of right lower lobe, unspecified aspiration pneumonia type ([place]) Improved D/C mucinex  2. Cellulitis of great toe, left Continue Doxycycline 100 mg bid for an additional 7 days and f/u for improvement or worsening Apply silver polymem and change per [facility] protocol  3. Hyponatremia Repeat BMP  4. Acute delirium Unchanged due to anesthesia and prior pna. Will recheck BMP as well  5. Status post lumbar spinal fusion Making small gains with therapy. Continue PT and OT as tolerated.   6. Leukocytosis Improved but chronic slight elevation due to prednisone use.   Family/ staff Communication: discussed with Mr. Masden   Labs/tests ordered:  BMP

## 2020-05-08 ENCOUNTER — Encounter: Payer: Self-pay | Admitting: Internal Medicine

## 2020-05-08 DIAGNOSIS — R278 Other lack of coordination: Secondary | ICD-10-CM | POA: Diagnosis not present

## 2020-05-08 DIAGNOSIS — M48062 Spinal stenosis, lumbar region with neurogenic claudication: Secondary | ICD-10-CM | POA: Diagnosis not present

## 2020-05-08 DIAGNOSIS — M62562 Muscle wasting and atrophy, not elsewhere classified, left lower leg: Secondary | ICD-10-CM | POA: Diagnosis not present

## 2020-05-08 DIAGNOSIS — R2689 Other abnormalities of gait and mobility: Secondary | ICD-10-CM | POA: Diagnosis not present

## 2020-05-08 DIAGNOSIS — M4726 Other spondylosis with radiculopathy, lumbar region: Secondary | ICD-10-CM | POA: Diagnosis not present

## 2020-05-08 DIAGNOSIS — L03032 Cellulitis of left toe: Secondary | ICD-10-CM | POA: Diagnosis not present

## 2020-05-08 DIAGNOSIS — Z4789 Encounter for other orthopedic aftercare: Secondary | ICD-10-CM | POA: Diagnosis not present

## 2020-05-08 DIAGNOSIS — M6389 Disorders of muscle in diseases classified elsewhere, multiple sites: Secondary | ICD-10-CM | POA: Diagnosis not present

## 2020-05-08 DIAGNOSIS — M62561 Muscle wasting and atrophy, not elsewhere classified, right lower leg: Secondary | ICD-10-CM | POA: Diagnosis not present

## 2020-05-08 LAB — BASIC METABOLIC PANEL
BUN: 20 (ref 4–21)
CO2: 32 — AB (ref 13–22)
Chloride: 100 (ref 99–108)
Creatinine: 1.1 (ref 0.6–1.3)
Glucose: 105
Potassium: 4.4 (ref 3.4–5.3)
Sodium: 137 (ref 137–147)

## 2020-05-08 LAB — COMPREHENSIVE METABOLIC PANEL: Calcium: 9.7 (ref 8.7–10.7)

## 2020-05-11 DIAGNOSIS — R2689 Other abnormalities of gait and mobility: Secondary | ICD-10-CM | POA: Diagnosis not present

## 2020-05-11 DIAGNOSIS — R278 Other lack of coordination: Secondary | ICD-10-CM | POA: Diagnosis not present

## 2020-05-11 DIAGNOSIS — Z4789 Encounter for other orthopedic aftercare: Secondary | ICD-10-CM | POA: Diagnosis not present

## 2020-05-11 DIAGNOSIS — M48062 Spinal stenosis, lumbar region with neurogenic claudication: Secondary | ICD-10-CM | POA: Diagnosis not present

## 2020-05-11 DIAGNOSIS — M62561 Muscle wasting and atrophy, not elsewhere classified, right lower leg: Secondary | ICD-10-CM | POA: Diagnosis not present

## 2020-05-11 DIAGNOSIS — M4726 Other spondylosis with radiculopathy, lumbar region: Secondary | ICD-10-CM | POA: Diagnosis not present

## 2020-05-11 DIAGNOSIS — M62562 Muscle wasting and atrophy, not elsewhere classified, left lower leg: Secondary | ICD-10-CM | POA: Diagnosis not present

## 2020-05-12 DIAGNOSIS — M4726 Other spondylosis with radiculopathy, lumbar region: Secondary | ICD-10-CM | POA: Diagnosis not present

## 2020-05-12 DIAGNOSIS — R278 Other lack of coordination: Secondary | ICD-10-CM | POA: Diagnosis not present

## 2020-05-12 DIAGNOSIS — M48062 Spinal stenosis, lumbar region with neurogenic claudication: Secondary | ICD-10-CM | POA: Diagnosis not present

## 2020-05-12 DIAGNOSIS — M62562 Muscle wasting and atrophy, not elsewhere classified, left lower leg: Secondary | ICD-10-CM | POA: Diagnosis not present

## 2020-05-12 DIAGNOSIS — R2689 Other abnormalities of gait and mobility: Secondary | ICD-10-CM | POA: Diagnosis not present

## 2020-05-12 DIAGNOSIS — Z4789 Encounter for other orthopedic aftercare: Secondary | ICD-10-CM | POA: Diagnosis not present

## 2020-05-12 DIAGNOSIS — M62561 Muscle wasting and atrophy, not elsewhere classified, right lower leg: Secondary | ICD-10-CM | POA: Diagnosis not present

## 2020-05-13 DIAGNOSIS — Z4789 Encounter for other orthopedic aftercare: Secondary | ICD-10-CM | POA: Diagnosis not present

## 2020-05-13 DIAGNOSIS — M4726 Other spondylosis with radiculopathy, lumbar region: Secondary | ICD-10-CM | POA: Diagnosis not present

## 2020-05-13 DIAGNOSIS — R278 Other lack of coordination: Secondary | ICD-10-CM | POA: Diagnosis not present

## 2020-05-13 DIAGNOSIS — M48062 Spinal stenosis, lumbar region with neurogenic claudication: Secondary | ICD-10-CM | POA: Diagnosis not present

## 2020-05-13 DIAGNOSIS — M62562 Muscle wasting and atrophy, not elsewhere classified, left lower leg: Secondary | ICD-10-CM | POA: Diagnosis not present

## 2020-05-13 DIAGNOSIS — R2689 Other abnormalities of gait and mobility: Secondary | ICD-10-CM | POA: Diagnosis not present

## 2020-05-13 DIAGNOSIS — M6389 Disorders of muscle in diseases classified elsewhere, multiple sites: Secondary | ICD-10-CM | POA: Diagnosis not present

## 2020-05-13 DIAGNOSIS — M62561 Muscle wasting and atrophy, not elsewhere classified, right lower leg: Secondary | ICD-10-CM | POA: Diagnosis not present

## 2020-05-14 DIAGNOSIS — R278 Other lack of coordination: Secondary | ICD-10-CM | POA: Diagnosis not present

## 2020-05-14 DIAGNOSIS — M62561 Muscle wasting and atrophy, not elsewhere classified, right lower leg: Secondary | ICD-10-CM | POA: Diagnosis not present

## 2020-05-14 DIAGNOSIS — Z4789 Encounter for other orthopedic aftercare: Secondary | ICD-10-CM | POA: Diagnosis not present

## 2020-05-14 DIAGNOSIS — M48062 Spinal stenosis, lumbar region with neurogenic claudication: Secondary | ICD-10-CM | POA: Diagnosis not present

## 2020-05-14 DIAGNOSIS — R2689 Other abnormalities of gait and mobility: Secondary | ICD-10-CM | POA: Diagnosis not present

## 2020-05-14 DIAGNOSIS — M4726 Other spondylosis with radiculopathy, lumbar region: Secondary | ICD-10-CM | POA: Diagnosis not present

## 2020-05-14 DIAGNOSIS — M62562 Muscle wasting and atrophy, not elsewhere classified, left lower leg: Secondary | ICD-10-CM | POA: Diagnosis not present

## 2020-05-15 DIAGNOSIS — M62561 Muscle wasting and atrophy, not elsewhere classified, right lower leg: Secondary | ICD-10-CM | POA: Diagnosis not present

## 2020-05-15 DIAGNOSIS — R2689 Other abnormalities of gait and mobility: Secondary | ICD-10-CM | POA: Diagnosis not present

## 2020-05-15 DIAGNOSIS — R278 Other lack of coordination: Secondary | ICD-10-CM | POA: Diagnosis not present

## 2020-05-15 DIAGNOSIS — Z4789 Encounter for other orthopedic aftercare: Secondary | ICD-10-CM | POA: Diagnosis not present

## 2020-05-15 DIAGNOSIS — M62562 Muscle wasting and atrophy, not elsewhere classified, left lower leg: Secondary | ICD-10-CM | POA: Diagnosis not present

## 2020-05-15 DIAGNOSIS — M6389 Disorders of muscle in diseases classified elsewhere, multiple sites: Secondary | ICD-10-CM | POA: Diagnosis not present

## 2020-05-15 DIAGNOSIS — M4726 Other spondylosis with radiculopathy, lumbar region: Secondary | ICD-10-CM | POA: Diagnosis not present

## 2020-05-15 DIAGNOSIS — M48062 Spinal stenosis, lumbar region with neurogenic claudication: Secondary | ICD-10-CM | POA: Diagnosis not present

## 2020-05-18 DIAGNOSIS — M6389 Disorders of muscle in diseases classified elsewhere, multiple sites: Secondary | ICD-10-CM | POA: Diagnosis not present

## 2020-05-18 DIAGNOSIS — M62561 Muscle wasting and atrophy, not elsewhere classified, right lower leg: Secondary | ICD-10-CM | POA: Diagnosis not present

## 2020-05-18 DIAGNOSIS — R278 Other lack of coordination: Secondary | ICD-10-CM | POA: Diagnosis not present

## 2020-05-18 DIAGNOSIS — R2689 Other abnormalities of gait and mobility: Secondary | ICD-10-CM | POA: Diagnosis not present

## 2020-05-18 DIAGNOSIS — M48062 Spinal stenosis, lumbar region with neurogenic claudication: Secondary | ICD-10-CM | POA: Diagnosis not present

## 2020-05-18 DIAGNOSIS — Z4789 Encounter for other orthopedic aftercare: Secondary | ICD-10-CM | POA: Diagnosis not present

## 2020-05-18 DIAGNOSIS — M62562 Muscle wasting and atrophy, not elsewhere classified, left lower leg: Secondary | ICD-10-CM | POA: Diagnosis not present

## 2020-05-18 DIAGNOSIS — M4726 Other spondylosis with radiculopathy, lumbar region: Secondary | ICD-10-CM | POA: Diagnosis not present

## 2020-05-19 DIAGNOSIS — Z4789 Encounter for other orthopedic aftercare: Secondary | ICD-10-CM | POA: Diagnosis not present

## 2020-05-19 DIAGNOSIS — M62562 Muscle wasting and atrophy, not elsewhere classified, left lower leg: Secondary | ICD-10-CM | POA: Diagnosis not present

## 2020-05-19 DIAGNOSIS — R278 Other lack of coordination: Secondary | ICD-10-CM | POA: Diagnosis not present

## 2020-05-19 DIAGNOSIS — R2689 Other abnormalities of gait and mobility: Secondary | ICD-10-CM | POA: Diagnosis not present

## 2020-05-19 DIAGNOSIS — M4726 Other spondylosis with radiculopathy, lumbar region: Secondary | ICD-10-CM | POA: Diagnosis not present

## 2020-05-19 DIAGNOSIS — M48062 Spinal stenosis, lumbar region with neurogenic claudication: Secondary | ICD-10-CM | POA: Diagnosis not present

## 2020-05-19 DIAGNOSIS — M62561 Muscle wasting and atrophy, not elsewhere classified, right lower leg: Secondary | ICD-10-CM | POA: Diagnosis not present

## 2020-05-20 DIAGNOSIS — Z4789 Encounter for other orthopedic aftercare: Secondary | ICD-10-CM | POA: Diagnosis not present

## 2020-05-20 DIAGNOSIS — M48062 Spinal stenosis, lumbar region with neurogenic claudication: Secondary | ICD-10-CM | POA: Diagnosis not present

## 2020-05-20 DIAGNOSIS — R278 Other lack of coordination: Secondary | ICD-10-CM | POA: Diagnosis not present

## 2020-05-20 DIAGNOSIS — R2689 Other abnormalities of gait and mobility: Secondary | ICD-10-CM | POA: Diagnosis not present

## 2020-05-20 DIAGNOSIS — M62561 Muscle wasting and atrophy, not elsewhere classified, right lower leg: Secondary | ICD-10-CM | POA: Diagnosis not present

## 2020-05-20 DIAGNOSIS — M62562 Muscle wasting and atrophy, not elsewhere classified, left lower leg: Secondary | ICD-10-CM | POA: Diagnosis not present

## 2020-05-20 DIAGNOSIS — M4726 Other spondylosis with radiculopathy, lumbar region: Secondary | ICD-10-CM | POA: Diagnosis not present

## 2020-05-21 ENCOUNTER — Non-Acute Institutional Stay (SKILLED_NURSING_FACILITY): Payer: PPO | Admitting: Adult Health

## 2020-05-21 DIAGNOSIS — R410 Disorientation, unspecified: Secondary | ICD-10-CM | POA: Diagnosis not present

## 2020-05-21 DIAGNOSIS — M48062 Spinal stenosis, lumbar region with neurogenic claudication: Secondary | ICD-10-CM | POA: Diagnosis not present

## 2020-05-21 DIAGNOSIS — Z4789 Encounter for other orthopedic aftercare: Secondary | ICD-10-CM | POA: Diagnosis not present

## 2020-05-21 DIAGNOSIS — E871 Hypo-osmolality and hyponatremia: Secondary | ICD-10-CM | POA: Diagnosis not present

## 2020-05-21 DIAGNOSIS — E2740 Unspecified adrenocortical insufficiency: Secondary | ICD-10-CM

## 2020-05-21 DIAGNOSIS — M6389 Disorders of muscle in diseases classified elsewhere, multiple sites: Secondary | ICD-10-CM | POA: Diagnosis not present

## 2020-05-21 DIAGNOSIS — M62562 Muscle wasting and atrophy, not elsewhere classified, left lower leg: Secondary | ICD-10-CM | POA: Diagnosis not present

## 2020-05-21 DIAGNOSIS — I10 Essential (primary) hypertension: Secondary | ICD-10-CM | POA: Diagnosis not present

## 2020-05-21 DIAGNOSIS — R278 Other lack of coordination: Secondary | ICD-10-CM | POA: Diagnosis not present

## 2020-05-21 DIAGNOSIS — M4726 Other spondylosis with radiculopathy, lumbar region: Secondary | ICD-10-CM | POA: Diagnosis not present

## 2020-05-21 DIAGNOSIS — M62561 Muscle wasting and atrophy, not elsewhere classified, right lower leg: Secondary | ICD-10-CM | POA: Diagnosis not present

## 2020-05-21 DIAGNOSIS — R2689 Other abnormalities of gait and mobility: Secondary | ICD-10-CM | POA: Diagnosis not present

## 2020-05-21 DIAGNOSIS — Z981 Arthrodesis status: Secondary | ICD-10-CM | POA: Diagnosis not present

## 2020-05-21 NOTE — Progress Notes (Signed)
Location:  Occupational psychologist of Service:  SNF (31) Provider:   Cindi Carbon, ANP [place] [phone]   [name], DO  Patient Care Team: [name], DO as PCP - General (Geriatric Medicine) [name], [name], MD as Referring Physician (Endocrinology) [name], MD as Consulting Physician (Orthopedic Surgery) Bond, Tracie Harrier, MD as Referring Physician (Ophthalmology) Magnus Sinning, MD as Consulting Physician (Physical Medicine and Rehabilitation)  Extended Emergency Contact Information Primary Emergency Contact: Kraft,[name] Address: [address]          [place], [place] [address] [name] of [place] Phone: [phone] Mobile Phone: [phone] Relation: Son Secondary Emergency Contact: [name], [place] [address] [name] of [place] Phone: [phone] Mobile Phone: [phone] Relation: Spouse  Code Status:  DNR Goals of care: Advanced Directive information Advanced Directives 04/28/2020  Does Patient Have a Medical Advance Directive? Yes  Type of Advance Directive Living will;Out of facility DNR (pink MOST or yellow form)  Does patient want to make changes to medical advance directive? No - Guardian declined  Copy of [place] in Chart? -  Would patient like information on creating a medical advance directive? -  Pre-existing out of facility DNR order (yellow form or pink MOST form) Pink MOST/Yellow Form most recent copy in chart - Physician notified to receive inpatient order     Chief Complaint  Patient presents with  . Medical Management of Chronic Issues    HPI:  Pt is a 86 y.o. male seen today for medical management of chronic diseases.   He currently resides in skilled rehab s/p lumbar fusion on 8/24.  He is pain free and making gains with therapy. He is moving to a permanent skilled room soon. He had  a CXR on 8/13 indicating aspiration pna and he was treated initially and later changed to Doxycycline due to GI upset. The course of doxycycline was extended due to a cellulitis of the left great toe for 7 more days on 9/9.  He has completed this and his toe has improved. He also has an area of pressure on the left toe that is healing. He is not having any cough, sob, or fever. No issues swallowing his food. He was having some increased confusion after surgery with hallucinations. This has improved but not resolved.    Past Medical History:  Diagnosis Date  . Allergic rhinitis    Prior allergy shots 20 years  . Altered mental status    when pain is unmanaged or while on pain meds   . Anemia   . Anemia, B12 deficiency 2000  . Arthritis    Status post left total replacement 8 2011  . BPH (benign prostatic hyperplasia) 2013  . Cancer ([place])    renal cell ca and skin cancer   . CKD (chronic kidney disease)    stage 3  . Colitis 2014  . Diabetes mellitus without complication ([place])    type 2 - no meds  . Diabetic polyneuropathy associated with type 2 diabetes mellitus ([place])   . Difficult intubation 1994   surgery had to be  stopped due to injury to "throat"  . Difficult intubation 1994   no trouble since.  Required nasotracheal intubation  [facility]02 [facility]; awake intubation 12/15/11  . GERD (gastroesophageal reflux disease)   . Glaucoma 2007   Status post left trabeculectomy 2007  .  History of colon polyps    Colonoscopy 2001  . History of kidney stones   . History of renal cell carcinoma 1994   Status post right nephrectomy  . Hyperlipidemia 2003  . Hypertension   . 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 Hypothyroidism (acquired) 2000  . Nocturia   . Osteomyelitis ([place])    right great  . Osteomyelitis ([place])    RIGHT FOOT /TOE  . Peripheral vascular disease ([place])   . Pituitary mass ([place]) 2000   S/p transphenoidal excision 05/1999 ([facility])  . Vitamin D deficiency 2009   Past Surgical History:  Procedure Laterality Date  . AMPUTATION Right 12/20/2017   Procedure: RIGHT 1ST RAY AMPUTATION;  Surgeon: [name], MD;  Location: [facility];  Service: Orthopedics;  Laterality: Right;  . AMPUTATION Right 03/23/2018   Procedure: RIGHT 2ND TOE AMPUTATION;  Surgeon: [name], MD;  Location: [place];  Service: Orthopedics;  Laterality: Right;  . BRAIN SURGERY  2000   pituatary gland removed .  Marland Kitchen CHOLECYSTECTOMY  1984  . Ectropion surgery Bilateral 2006  . EYE SURGERY  over last 6 yrs.     trabeculectomy...   . EYE SURGERY   cat ext ou  . JOINT REPLACEMENT  2011   knee left  . KNEE ARTHROSCOPY Right 02/06/2015   Procedure: RIGHT ARTHROSCOPY KNEE WITH LATERAL MENSICAL  DEBRIDEMENT;  Surgeon: Gaynelle Arabian, MD;  Location: [facility];  Service: Orthopedics;  Laterality: Right;  . Mohs procedure      for skin cancer on nose   . NEPHRECTOMY Right 1994   Renal cell  . REVERSE SHOULDER ARTHROPLASTY  12/15/2011   Procedure: REVERSE SHOULDER ARTHROPLASTY;  Surgeon: Marin Shutter, MD;  Location: [facility];  Service: Orthopedics;  Laterality: Right;  right total reverse shoulder  . TONSILLECTOMY    . TOTAL KNEE ARTHROPLASTY Right 06/29/2015   Procedure: TOTAL KNEE ARTHROPLASTY;  Surgeon: Gaynelle Arabian, MD;  Location: [facility];  Service: Orthopedics;  Laterality: Right;  . Transsphenoidal excision pituitary tumor  05/1999   A.Tommi Rumps, M.D.([facility])    Allergies  Allergen Reactions  . Adhesive [Tape] Other (See Comments)    CAUSES SKIN TEARS, prefers paper tape   . Lyrica [Pregabalin] Swelling  . Percocet [Oxycodone-Acetaminophen] Other (See Comments)    UNSPECIFIED REACTION  Does not want to take  . Robaxin [Methocarbamol]     UNSPECIFIED REACTION   . Diovan [Valsartan] Rash    Outpatient Encounter Medications as of 05/21/2020  Medication Sig  . acetaminophen (TYLENOL) 500 MG tablet Take 1,000 mg by mouth 3 (three) times daily as  needed for moderate pain.   . Cholecalciferol (VITAMIN D) 2000 units tablet Take 2,000 Units by mouth daily.  . Coenzyme Q10 300 MG CAPS Take 300 mg by mouth daily.   . Diclofenac Sodium 1.5 % SOLN Dispense 10 to 40 drops to affected area on skin as directed, twice per day  . finasteride (PROSCAR) 5 MG tablet Take 5 mg by mouth daily.   . Flaxseed, Linseed, (FLAXSEED OIL) 1000 MG CAPS Take 1,000 mg by mouth daily.  . mirabegron ER (MYRBETRIQ) 25 MG TB24 tablet Take 25 mg by mouth every other day.  . nystatin-triamcinolone (MYCOLOG II) cream Apply 1 application topically 2 (two) times daily.  Vladimir Faster Glycol-Propyl Glycol (SYSTANE) 0.4-0.3 % SOLN Apply 1 drop to eye daily. Left eye  . polyethylene glycol (MIRALAX / GLYCOLAX) 17 g packet  Take 17 g by mouth daily.  . predniSONE (DELTASONE) 5 MG tablet Take 2.5-5 mg by mouth See admin instructions. Take 5 mg by mouth in the morning and 2.5 mg in the evening  . SYNTHROID 88 MCG tablet Take 88 mcg by mouth daily before breakfast.   . fluticasone (FLONASE) 50 MCG/ACT nasal spray Place 2 sprays into both nostrils daily.  . Multiple Vitamins-Minerals (MULTIVITAMIN & MINERAL PO) Take 1 tablet by mouth daily.  . pravastatin (PRAVACHOL) 80 MG tablet Take 80 mg by mouth every other day.   . testosterone cypionate (DEPOTESTOSTERONE CYPIONATE) 200 MG/ML injection Inject 30 mg into the muscle every Friday.   . timolol (TIMOPTIC) 0.5 % ophthalmic solution Place 1 drop into the right eye daily.  . traMADol (ULTRAM) 50 MG tablet Take 0.5-1 tablets (25-50 mg total) by mouth every 6 (six) hours as needed. 25 for mild to moderate pain, 50 for severe pain  . vitamin B-12 (CYANOCOBALAMIN) 500 MCG tablet Take 500 mcg by mouth daily.   No facility-administered encounter medications on file as of 05/21/2020.    Review of Systems  Constitutional: Negative for activity change, appetite change, chills, diaphoresis, fatigue, fever and unexpected weight change.   Respiratory: Negative for cough, shortness of breath, wheezing and stridor.   Cardiovascular: Negative for chest pain, palpitations and leg swelling.  Gastrointestinal: Negative for abdominal distention, abdominal pain, constipation and diarrhea.  Genitourinary: Negative for difficulty urinating and dysuria.  Musculoskeletal: Positive for gait problem. Negative for arthralgias, back pain, joint swelling and myalgias.  Skin: Positive for wound.  Neurological: Positive for weakness. Negative for dizziness, seizures, syncope, facial asymmetry, speech difficulty and headaches.  Hematological: Negative for adenopathy. Does not bruise/bleed easily.  Psychiatric/Behavioral: Positive for confusion. Negative for agitation and behavioral problems.    Immunization History  Administered Date(s) Administered  . Fluad Quad(high Dose 65+) 04/30/2019  . Influenza, High Dose Seasonal PF 04/30/2017, 06/08/2018  . Influenza, Quadrivalent, Recombinant, Inj, Pf 05/19/2016  . Moderna SARS-COVID-2 Vaccination 09/10/2019, 10/08/2019  . Pneumococcal Conjugate-13 06/04/2015  . Pneumococcal Polysaccharide-23 06/29/2010  . Tdap 10/28/1998, 06/08/2018  . Zoster 10/27/2005  . Zoster Recombinat (Shingrix) 02/04/2018, 07/02/2018   Pertinent  Health Maintenance Due  Topic Date Due  . URINE MICROALBUMIN  Never done  . HEMOGLOBIN A1C  09/20/2018  . INFLUENZA VACCINE  03/29/2020  . OPHTHALMOLOGY EXAM  06/04/2020  . FOOT EXAM  02/09/2021  . PNA vac Low Risk Adult  Completed   Fall Risk  07/24/2019 07/17/2019 07/05/2019 03/13/2019 11/14/2018  Falls in the past year? 0 0 0 0 1  Comment [facility]i Telephone Survey: data to providers prior to load - - - -  Number falls in past yr: - 0 0 0 0  Comment - - - - -  Injury with Fall? - 0 0 0 1  Risk Factor Category  - - - - -  Risk for fall due to : - - - - -   Functional Status Survey:    Vitals:   05/21/20 1004  BP: (!) 158/81  Pulse: 83  Resp: 18  Temp: 97.9 F (36.6  C)  SpO2: 96%   There is no height or weight on file to calculate BMI. Physical Exam Vitals and nursing note reviewed.  Constitutional:      General: He is not in acute distress.    Appearance: He is not diaphoretic.  HENT:     Head: Normocephalic and atraumatic.  Neck:     Thyroid: No  thyromegaly.     Vascular: No JVD.     Trachea: No tracheal deviation.  Cardiovascular:     Rate and Rhythm: Normal rate and regular rhythm.     Heart sounds: No murmur heard.   Pulmonary:     Effort: Pulmonary effort is normal. No respiratory distress.     Breath sounds: No wheezing or rales.  Abdominal:     General: Bowel sounds are normal. There is no distension.     Palpations: Abdomen is soft.     Tenderness: There is no abdominal tenderness.  Musculoskeletal:        General: No swelling, tenderness, deformity or signs of injury.     Right lower leg: No edema.     Left lower leg: No edema.     Comments: Strength BLE 4/5.  MAE  Lymphadenopathy:     Cervical: No cervical adenopathy.  Skin:    General: Skin is warm and dry.     Comments: Left great toe with silver polymem in place. No drainage, redness or swelling noted. Left heel with silver polymem in place. No drainage swelling or redness noted  Neurological:     Mental Status: He is alert and oriented to person, place, and time.     Cranial Nerves: No cranial nerve deficit.  Psychiatric:        Mood and Affect: Mood normal.     Labs reviewed: Recent Labs    08/29/19 0000 08/29/19 0000 02/11/20 0000 05/04/20 0000 05/08/20 0000  NA 139   < > 136* 128* 137  K 3.9  --   --  4.3 4.4  CL 101  --   --  91* 100  CO2 33*   < > 27* 28* 32*  BUN 25*   < > 19 21 20   CREATININE 1.2   < > 0.9 1.1 1.1  CALCIUM 9.4   < > 9.3 9.0 9.7   < > = values in this interval not displayed.   Recent Labs    08/29/19 0000  ALBUMIN 3.6   Recent Labs    04/21/20 0628 05/04/20 0000 05/05/20 0000  WBC 13.4* 15.6 9.9  NEUTROABS 8.6* 12 6   HGB 13.4 10.7* 10.8*  HCT 42.5 33* 32*  MCV 102.2*  --   --   PLT 289 237 250   Lab Results  Component Value Date   TSH <0.010 (L) 03/20/2018   Lab Results  Component Value Date   HGBA1C 6.5 (H) 03/20/2018   Lab Results  Component Value Date   CHOL 138 08/29/2019   HDL 2 (A) 08/29/2019   LDLCALC 61 08/29/2019   TRIG 72 08/29/2019    Significant Diagnostic Results in last 30 days:  DG Lumbar Spine 2-3 Views  Result Date: 04/21/2020 CLINICAL DATA:  Posterior lumbar interbody fusion. EXAM: LUMBAR SPINE - 2-3 VIEW; DG C-ARM 1-60 MIN COMPARISON:  Lumbar radiographs 04/08/2020. FINDINGS: Two nondiagnostic fluoroscopic images demonstrate postsurgical changes related to bilateral L5-S1 posterior fusion with interbody graft. Similar alignment with L5-S1 anterolisthesis. 24.6 seconds of fluoro time with 6.85 mGy dose. IMPRESSION: Intraoperative fluoroscopic guidance for lumbar spine fusion. Electronically Signed   By: Margaretha Sheffield MD   On: 04/21/2020 11:03   DG C-Arm 1-60 Min  Result Date: 04/21/2020 CLINICAL DATA:  Posterior lumbar interbody fusion. EXAM: LUMBAR SPINE - 2-3 VIEW; DG C-ARM 1-60 MIN COMPARISON:  Lumbar radiographs 04/08/2020. FINDINGS: Two nondiagnostic fluoroscopic images demonstrate postsurgical changes related to bilateral L5-S1 posterior fusion  with interbody graft. Similar alignment with L5-S1 anterolisthesis. 24.6 seconds of fluoro time with 6.85 mGy dose. IMPRESSION: Intraoperative fluoroscopic guidance for lumbar spine fusion. Electronically Signed   By: Margaretha Sheffield MD   On: 04/21/2020 11:03    Assessment/Plan  1. Spinal stenosis of lumbar region with neurogenic claudication With chronic pain which led to surgery.   2. Status post lumbar spinal fusion He is now pain free with functional gains in mobility but will likely need skilled care long term.   3. Acute delirium Due to pneumonia and anesthesia.  Resolving  4. Essential  hypertension Controlled, not currently on meds  5. Hyponatremia NA 137 on 9/10 resolved  6. Adrenal insufficiency (HCC) Continues on Prednisone for supplementation, followed by. Dr. Elyse Hsu  Family/ staff Communication: discussed with the resident   Labs/tests ordered:  NA

## 2020-05-22 ENCOUNTER — Encounter: Payer: Self-pay | Admitting: Adult Health

## 2020-05-22 DIAGNOSIS — M4726 Other spondylosis with radiculopathy, lumbar region: Secondary | ICD-10-CM | POA: Diagnosis not present

## 2020-05-22 DIAGNOSIS — M62561 Muscle wasting and atrophy, not elsewhere classified, right lower leg: Secondary | ICD-10-CM | POA: Diagnosis not present

## 2020-05-22 DIAGNOSIS — R2689 Other abnormalities of gait and mobility: Secondary | ICD-10-CM | POA: Diagnosis not present

## 2020-05-22 DIAGNOSIS — Z4789 Encounter for other orthopedic aftercare: Secondary | ICD-10-CM | POA: Diagnosis not present

## 2020-05-22 DIAGNOSIS — M6389 Disorders of muscle in diseases classified elsewhere, multiple sites: Secondary | ICD-10-CM | POA: Diagnosis not present

## 2020-05-22 DIAGNOSIS — M62562 Muscle wasting and atrophy, not elsewhere classified, left lower leg: Secondary | ICD-10-CM | POA: Diagnosis not present

## 2020-05-22 DIAGNOSIS — R278 Other lack of coordination: Secondary | ICD-10-CM | POA: Diagnosis not present

## 2020-05-22 DIAGNOSIS — M48062 Spinal stenosis, lumbar region with neurogenic claudication: Secondary | ICD-10-CM | POA: Diagnosis not present

## 2020-05-25 ENCOUNTER — Encounter: Payer: Self-pay | Admitting: Internal Medicine

## 2020-05-25 DIAGNOSIS — M6389 Disorders of muscle in diseases classified elsewhere, multiple sites: Secondary | ICD-10-CM | POA: Diagnosis not present

## 2020-05-25 DIAGNOSIS — Z4789 Encounter for other orthopedic aftercare: Secondary | ICD-10-CM | POA: Diagnosis not present

## 2020-05-25 DIAGNOSIS — M48062 Spinal stenosis, lumbar region with neurogenic claudication: Secondary | ICD-10-CM | POA: Diagnosis not present

## 2020-05-25 DIAGNOSIS — R2689 Other abnormalities of gait and mobility: Secondary | ICD-10-CM | POA: Diagnosis not present

## 2020-05-25 DIAGNOSIS — R278 Other lack of coordination: Secondary | ICD-10-CM | POA: Diagnosis not present

## 2020-05-25 DIAGNOSIS — M62561 Muscle wasting and atrophy, not elsewhere classified, right lower leg: Secondary | ICD-10-CM | POA: Diagnosis not present

## 2020-05-25 DIAGNOSIS — M62562 Muscle wasting and atrophy, not elsewhere classified, left lower leg: Secondary | ICD-10-CM | POA: Diagnosis not present

## 2020-05-25 DIAGNOSIS — M4726 Other spondylosis with radiculopathy, lumbar region: Secondary | ICD-10-CM | POA: Diagnosis not present

## 2020-05-26 DIAGNOSIS — Z4789 Encounter for other orthopedic aftercare: Secondary | ICD-10-CM | POA: Diagnosis not present

## 2020-05-26 DIAGNOSIS — M48062 Spinal stenosis, lumbar region with neurogenic claudication: Secondary | ICD-10-CM | POA: Diagnosis not present

## 2020-05-26 DIAGNOSIS — M62562 Muscle wasting and atrophy, not elsewhere classified, left lower leg: Secondary | ICD-10-CM | POA: Diagnosis not present

## 2020-05-26 DIAGNOSIS — M4726 Other spondylosis with radiculopathy, lumbar region: Secondary | ICD-10-CM | POA: Diagnosis not present

## 2020-05-26 DIAGNOSIS — R2689 Other abnormalities of gait and mobility: Secondary | ICD-10-CM | POA: Diagnosis not present

## 2020-05-26 DIAGNOSIS — R278 Other lack of coordination: Secondary | ICD-10-CM | POA: Diagnosis not present

## 2020-05-26 DIAGNOSIS — M62561 Muscle wasting and atrophy, not elsewhere classified, right lower leg: Secondary | ICD-10-CM | POA: Diagnosis not present

## 2020-05-27 DIAGNOSIS — M4726 Other spondylosis with radiculopathy, lumbar region: Secondary | ICD-10-CM | POA: Diagnosis not present

## 2020-05-27 DIAGNOSIS — M6389 Disorders of muscle in diseases classified elsewhere, multiple sites: Secondary | ICD-10-CM | POA: Diagnosis not present

## 2020-05-27 DIAGNOSIS — R2689 Other abnormalities of gait and mobility: Secondary | ICD-10-CM | POA: Diagnosis not present

## 2020-05-27 DIAGNOSIS — M62562 Muscle wasting and atrophy, not elsewhere classified, left lower leg: Secondary | ICD-10-CM | POA: Diagnosis not present

## 2020-05-27 DIAGNOSIS — M62561 Muscle wasting and atrophy, not elsewhere classified, right lower leg: Secondary | ICD-10-CM | POA: Diagnosis not present

## 2020-05-27 DIAGNOSIS — Z4789 Encounter for other orthopedic aftercare: Secondary | ICD-10-CM | POA: Diagnosis not present

## 2020-05-27 DIAGNOSIS — M48062 Spinal stenosis, lumbar region with neurogenic claudication: Secondary | ICD-10-CM | POA: Diagnosis not present

## 2020-05-27 DIAGNOSIS — R278 Other lack of coordination: Secondary | ICD-10-CM | POA: Diagnosis not present

## 2020-05-28 DIAGNOSIS — M4726 Other spondylosis with radiculopathy, lumbar region: Secondary | ICD-10-CM | POA: Diagnosis not present

## 2020-05-28 DIAGNOSIS — Z4789 Encounter for other orthopedic aftercare: Secondary | ICD-10-CM | POA: Diagnosis not present

## 2020-05-28 DIAGNOSIS — M48062 Spinal stenosis, lumbar region with neurogenic claudication: Secondary | ICD-10-CM | POA: Diagnosis not present

## 2020-05-28 DIAGNOSIS — M6389 Disorders of muscle in diseases classified elsewhere, multiple sites: Secondary | ICD-10-CM | POA: Diagnosis not present

## 2020-05-28 DIAGNOSIS — R278 Other lack of coordination: Secondary | ICD-10-CM | POA: Diagnosis not present

## 2020-05-29 DIAGNOSIS — M4726 Other spondylosis with radiculopathy, lumbar region: Secondary | ICD-10-CM | POA: Diagnosis not present

## 2020-05-29 DIAGNOSIS — M62561 Muscle wasting and atrophy, not elsewhere classified, right lower leg: Secondary | ICD-10-CM | POA: Diagnosis not present

## 2020-05-29 DIAGNOSIS — M48062 Spinal stenosis, lumbar region with neurogenic claudication: Secondary | ICD-10-CM | POA: Diagnosis not present

## 2020-05-29 DIAGNOSIS — Z4789 Encounter for other orthopedic aftercare: Secondary | ICD-10-CM | POA: Diagnosis not present

## 2020-05-29 DIAGNOSIS — R278 Other lack of coordination: Secondary | ICD-10-CM | POA: Diagnosis not present

## 2020-05-29 DIAGNOSIS — M62562 Muscle wasting and atrophy, not elsewhere classified, left lower leg: Secondary | ICD-10-CM | POA: Diagnosis not present

## 2020-05-29 DIAGNOSIS — R2689 Other abnormalities of gait and mobility: Secondary | ICD-10-CM | POA: Diagnosis not present

## 2020-06-01 DIAGNOSIS — Z4789 Encounter for other orthopedic aftercare: Secondary | ICD-10-CM | POA: Diagnosis not present

## 2020-06-01 DIAGNOSIS — M62561 Muscle wasting and atrophy, not elsewhere classified, right lower leg: Secondary | ICD-10-CM | POA: Diagnosis not present

## 2020-06-01 DIAGNOSIS — M6389 Disorders of muscle in diseases classified elsewhere, multiple sites: Secondary | ICD-10-CM | POA: Diagnosis not present

## 2020-06-01 DIAGNOSIS — M4726 Other spondylosis with radiculopathy, lumbar region: Secondary | ICD-10-CM | POA: Diagnosis not present

## 2020-06-01 DIAGNOSIS — R278 Other lack of coordination: Secondary | ICD-10-CM | POA: Diagnosis not present

## 2020-06-01 DIAGNOSIS — M48062 Spinal stenosis, lumbar region with neurogenic claudication: Secondary | ICD-10-CM | POA: Diagnosis not present

## 2020-06-01 DIAGNOSIS — R2689 Other abnormalities of gait and mobility: Secondary | ICD-10-CM | POA: Diagnosis not present

## 2020-06-01 DIAGNOSIS — M62562 Muscle wasting and atrophy, not elsewhere classified, left lower leg: Secondary | ICD-10-CM | POA: Diagnosis not present

## 2020-06-02 DIAGNOSIS — R278 Other lack of coordination: Secondary | ICD-10-CM | POA: Diagnosis not present

## 2020-06-02 DIAGNOSIS — M62562 Muscle wasting and atrophy, not elsewhere classified, left lower leg: Secondary | ICD-10-CM | POA: Diagnosis not present

## 2020-06-02 DIAGNOSIS — Z4789 Encounter for other orthopedic aftercare: Secondary | ICD-10-CM | POA: Diagnosis not present

## 2020-06-02 DIAGNOSIS — M6389 Disorders of muscle in diseases classified elsewhere, multiple sites: Secondary | ICD-10-CM | POA: Diagnosis not present

## 2020-06-02 DIAGNOSIS — M62561 Muscle wasting and atrophy, not elsewhere classified, right lower leg: Secondary | ICD-10-CM | POA: Diagnosis not present

## 2020-06-02 DIAGNOSIS — M48062 Spinal stenosis, lumbar region with neurogenic claudication: Secondary | ICD-10-CM | POA: Diagnosis not present

## 2020-06-02 DIAGNOSIS — M4726 Other spondylosis with radiculopathy, lumbar region: Secondary | ICD-10-CM | POA: Diagnosis not present

## 2020-06-02 DIAGNOSIS — R2689 Other abnormalities of gait and mobility: Secondary | ICD-10-CM | POA: Diagnosis not present

## 2020-06-03 DIAGNOSIS — M4726 Other spondylosis with radiculopathy, lumbar region: Secondary | ICD-10-CM | POA: Diagnosis not present

## 2020-06-03 DIAGNOSIS — M6389 Disorders of muscle in diseases classified elsewhere, multiple sites: Secondary | ICD-10-CM | POA: Diagnosis not present

## 2020-06-03 DIAGNOSIS — M48062 Spinal stenosis, lumbar region with neurogenic claudication: Secondary | ICD-10-CM | POA: Diagnosis not present

## 2020-06-03 DIAGNOSIS — R2689 Other abnormalities of gait and mobility: Secondary | ICD-10-CM | POA: Diagnosis not present

## 2020-06-03 DIAGNOSIS — M62561 Muscle wasting and atrophy, not elsewhere classified, right lower leg: Secondary | ICD-10-CM | POA: Diagnosis not present

## 2020-06-03 DIAGNOSIS — Z4789 Encounter for other orthopedic aftercare: Secondary | ICD-10-CM | POA: Diagnosis not present

## 2020-06-03 DIAGNOSIS — R278 Other lack of coordination: Secondary | ICD-10-CM | POA: Diagnosis not present

## 2020-06-03 DIAGNOSIS — M62562 Muscle wasting and atrophy, not elsewhere classified, left lower leg: Secondary | ICD-10-CM | POA: Diagnosis not present

## 2020-06-04 DIAGNOSIS — M6389 Disorders of muscle in diseases classified elsewhere, multiple sites: Secondary | ICD-10-CM | POA: Diagnosis not present

## 2020-06-04 DIAGNOSIS — M48062 Spinal stenosis, lumbar region with neurogenic claudication: Secondary | ICD-10-CM | POA: Diagnosis not present

## 2020-06-04 DIAGNOSIS — R2689 Other abnormalities of gait and mobility: Secondary | ICD-10-CM | POA: Diagnosis not present

## 2020-06-04 DIAGNOSIS — M62562 Muscle wasting and atrophy, not elsewhere classified, left lower leg: Secondary | ICD-10-CM | POA: Diagnosis not present

## 2020-06-04 DIAGNOSIS — M4726 Other spondylosis with radiculopathy, lumbar region: Secondary | ICD-10-CM | POA: Diagnosis not present

## 2020-06-04 DIAGNOSIS — Z4789 Encounter for other orthopedic aftercare: Secondary | ICD-10-CM | POA: Diagnosis not present

## 2020-06-04 DIAGNOSIS — M62561 Muscle wasting and atrophy, not elsewhere classified, right lower leg: Secondary | ICD-10-CM | POA: Diagnosis not present

## 2020-06-04 DIAGNOSIS — R278 Other lack of coordination: Secondary | ICD-10-CM | POA: Diagnosis not present

## 2020-06-05 DIAGNOSIS — R278 Other lack of coordination: Secondary | ICD-10-CM | POA: Diagnosis not present

## 2020-06-05 DIAGNOSIS — M62561 Muscle wasting and atrophy, not elsewhere classified, right lower leg: Secondary | ICD-10-CM | POA: Diagnosis not present

## 2020-06-05 DIAGNOSIS — M62562 Muscle wasting and atrophy, not elsewhere classified, left lower leg: Secondary | ICD-10-CM | POA: Diagnosis not present

## 2020-06-05 DIAGNOSIS — M6389 Disorders of muscle in diseases classified elsewhere, multiple sites: Secondary | ICD-10-CM | POA: Diagnosis not present

## 2020-06-05 DIAGNOSIS — R2689 Other abnormalities of gait and mobility: Secondary | ICD-10-CM | POA: Diagnosis not present

## 2020-06-05 DIAGNOSIS — M48062 Spinal stenosis, lumbar region with neurogenic claudication: Secondary | ICD-10-CM | POA: Diagnosis not present

## 2020-06-05 DIAGNOSIS — M4726 Other spondylosis with radiculopathy, lumbar region: Secondary | ICD-10-CM | POA: Diagnosis not present

## 2020-06-05 DIAGNOSIS — Z4789 Encounter for other orthopedic aftercare: Secondary | ICD-10-CM | POA: Diagnosis not present

## 2020-06-08 DIAGNOSIS — M4726 Other spondylosis with radiculopathy, lumbar region: Secondary | ICD-10-CM | POA: Diagnosis not present

## 2020-06-08 DIAGNOSIS — M62562 Muscle wasting and atrophy, not elsewhere classified, left lower leg: Secondary | ICD-10-CM | POA: Diagnosis not present

## 2020-06-08 DIAGNOSIS — R278 Other lack of coordination: Secondary | ICD-10-CM | POA: Diagnosis not present

## 2020-06-08 DIAGNOSIS — R2689 Other abnormalities of gait and mobility: Secondary | ICD-10-CM | POA: Diagnosis not present

## 2020-06-08 DIAGNOSIS — M48062 Spinal stenosis, lumbar region with neurogenic claudication: Secondary | ICD-10-CM | POA: Diagnosis not present

## 2020-06-08 DIAGNOSIS — Z4789 Encounter for other orthopedic aftercare: Secondary | ICD-10-CM | POA: Diagnosis not present

## 2020-06-08 DIAGNOSIS — Z20828 Contact with and (suspected) exposure to other viral communicable diseases: Secondary | ICD-10-CM | POA: Diagnosis not present

## 2020-06-08 DIAGNOSIS — M6389 Disorders of muscle in diseases classified elsewhere, multiple sites: Secondary | ICD-10-CM | POA: Diagnosis not present

## 2020-06-08 DIAGNOSIS — M62561 Muscle wasting and atrophy, not elsewhere classified, right lower leg: Secondary | ICD-10-CM | POA: Diagnosis not present

## 2020-06-09 DIAGNOSIS — M62562 Muscle wasting and atrophy, not elsewhere classified, left lower leg: Secondary | ICD-10-CM | POA: Diagnosis not present

## 2020-06-09 DIAGNOSIS — M4726 Other spondylosis with radiculopathy, lumbar region: Secondary | ICD-10-CM | POA: Diagnosis not present

## 2020-06-09 DIAGNOSIS — M62561 Muscle wasting and atrophy, not elsewhere classified, right lower leg: Secondary | ICD-10-CM | POA: Diagnosis not present

## 2020-06-09 DIAGNOSIS — R278 Other lack of coordination: Secondary | ICD-10-CM | POA: Diagnosis not present

## 2020-06-09 DIAGNOSIS — M48062 Spinal stenosis, lumbar region with neurogenic claudication: Secondary | ICD-10-CM | POA: Diagnosis not present

## 2020-06-09 DIAGNOSIS — R2689 Other abnormalities of gait and mobility: Secondary | ICD-10-CM | POA: Diagnosis not present

## 2020-06-09 DIAGNOSIS — Z4789 Encounter for other orthopedic aftercare: Secondary | ICD-10-CM | POA: Diagnosis not present

## 2020-06-09 DIAGNOSIS — M6389 Disorders of muscle in diseases classified elsewhere, multiple sites: Secondary | ICD-10-CM | POA: Diagnosis not present

## 2020-06-10 DIAGNOSIS — M4310 Spondylolisthesis, site unspecified: Secondary | ICD-10-CM | POA: Diagnosis not present

## 2020-06-10 DIAGNOSIS — M6389 Disorders of muscle in diseases classified elsewhere, multiple sites: Secondary | ICD-10-CM | POA: Diagnosis not present

## 2020-06-10 DIAGNOSIS — R278 Other lack of coordination: Secondary | ICD-10-CM | POA: Diagnosis not present

## 2020-06-10 DIAGNOSIS — M48062 Spinal stenosis, lumbar region with neurogenic claudication: Secondary | ICD-10-CM | POA: Diagnosis not present

## 2020-06-10 DIAGNOSIS — M4726 Other spondylosis with radiculopathy, lumbar region: Secondary | ICD-10-CM | POA: Diagnosis not present

## 2020-06-10 DIAGNOSIS — Z4789 Encounter for other orthopedic aftercare: Secondary | ICD-10-CM | POA: Diagnosis not present

## 2020-06-11 DIAGNOSIS — Z4789 Encounter for other orthopedic aftercare: Secondary | ICD-10-CM | POA: Diagnosis not present

## 2020-06-11 DIAGNOSIS — M48062 Spinal stenosis, lumbar region with neurogenic claudication: Secondary | ICD-10-CM | POA: Diagnosis not present

## 2020-06-11 DIAGNOSIS — M62561 Muscle wasting and atrophy, not elsewhere classified, right lower leg: Secondary | ICD-10-CM | POA: Diagnosis not present

## 2020-06-11 DIAGNOSIS — R278 Other lack of coordination: Secondary | ICD-10-CM | POA: Diagnosis not present

## 2020-06-11 DIAGNOSIS — M4726 Other spondylosis with radiculopathy, lumbar region: Secondary | ICD-10-CM | POA: Diagnosis not present

## 2020-06-11 DIAGNOSIS — M62562 Muscle wasting and atrophy, not elsewhere classified, left lower leg: Secondary | ICD-10-CM | POA: Diagnosis not present

## 2020-06-11 DIAGNOSIS — R2689 Other abnormalities of gait and mobility: Secondary | ICD-10-CM | POA: Diagnosis not present

## 2020-06-12 DIAGNOSIS — M4726 Other spondylosis with radiculopathy, lumbar region: Secondary | ICD-10-CM | POA: Diagnosis not present

## 2020-06-12 DIAGNOSIS — M62561 Muscle wasting and atrophy, not elsewhere classified, right lower leg: Secondary | ICD-10-CM | POA: Diagnosis not present

## 2020-06-12 DIAGNOSIS — M62562 Muscle wasting and atrophy, not elsewhere classified, left lower leg: Secondary | ICD-10-CM | POA: Diagnosis not present

## 2020-06-12 DIAGNOSIS — M48062 Spinal stenosis, lumbar region with neurogenic claudication: Secondary | ICD-10-CM | POA: Diagnosis not present

## 2020-06-12 DIAGNOSIS — R278 Other lack of coordination: Secondary | ICD-10-CM | POA: Diagnosis not present

## 2020-06-12 DIAGNOSIS — Z4789 Encounter for other orthopedic aftercare: Secondary | ICD-10-CM | POA: Diagnosis not present

## 2020-06-12 DIAGNOSIS — R2689 Other abnormalities of gait and mobility: Secondary | ICD-10-CM | POA: Diagnosis not present

## 2020-06-15 DIAGNOSIS — M62562 Muscle wasting and atrophy, not elsewhere classified, left lower leg: Secondary | ICD-10-CM | POA: Diagnosis not present

## 2020-06-15 DIAGNOSIS — R278 Other lack of coordination: Secondary | ICD-10-CM | POA: Diagnosis not present

## 2020-06-15 DIAGNOSIS — M62561 Muscle wasting and atrophy, not elsewhere classified, right lower leg: Secondary | ICD-10-CM | POA: Diagnosis not present

## 2020-06-15 DIAGNOSIS — M4726 Other spondylosis with radiculopathy, lumbar region: Secondary | ICD-10-CM | POA: Diagnosis not present

## 2020-06-15 DIAGNOSIS — M48062 Spinal stenosis, lumbar region with neurogenic claudication: Secondary | ICD-10-CM | POA: Diagnosis not present

## 2020-06-15 DIAGNOSIS — R2689 Other abnormalities of gait and mobility: Secondary | ICD-10-CM | POA: Diagnosis not present

## 2020-06-15 DIAGNOSIS — Z4789 Encounter for other orthopedic aftercare: Secondary | ICD-10-CM | POA: Diagnosis not present

## 2020-06-16 DIAGNOSIS — M62562 Muscle wasting and atrophy, not elsewhere classified, left lower leg: Secondary | ICD-10-CM | POA: Diagnosis not present

## 2020-06-16 DIAGNOSIS — R2689 Other abnormalities of gait and mobility: Secondary | ICD-10-CM | POA: Diagnosis not present

## 2020-06-16 DIAGNOSIS — Z4789 Encounter for other orthopedic aftercare: Secondary | ICD-10-CM | POA: Diagnosis not present

## 2020-06-16 DIAGNOSIS — R278 Other lack of coordination: Secondary | ICD-10-CM | POA: Diagnosis not present

## 2020-06-16 DIAGNOSIS — M62561 Muscle wasting and atrophy, not elsewhere classified, right lower leg: Secondary | ICD-10-CM | POA: Diagnosis not present

## 2020-06-16 DIAGNOSIS — M48062 Spinal stenosis, lumbar region with neurogenic claudication: Secondary | ICD-10-CM | POA: Diagnosis not present

## 2020-06-16 DIAGNOSIS — M6389 Disorders of muscle in diseases classified elsewhere, multiple sites: Secondary | ICD-10-CM | POA: Diagnosis not present

## 2020-06-16 DIAGNOSIS — M4726 Other spondylosis with radiculopathy, lumbar region: Secondary | ICD-10-CM | POA: Diagnosis not present

## 2020-06-17 DIAGNOSIS — R2689 Other abnormalities of gait and mobility: Secondary | ICD-10-CM | POA: Diagnosis not present

## 2020-06-17 DIAGNOSIS — R278 Other lack of coordination: Secondary | ICD-10-CM | POA: Diagnosis not present

## 2020-06-17 DIAGNOSIS — M4726 Other spondylosis with radiculopathy, lumbar region: Secondary | ICD-10-CM | POA: Diagnosis not present

## 2020-06-17 DIAGNOSIS — M6389 Disorders of muscle in diseases classified elsewhere, multiple sites: Secondary | ICD-10-CM | POA: Diagnosis not present

## 2020-06-17 DIAGNOSIS — M62562 Muscle wasting and atrophy, not elsewhere classified, left lower leg: Secondary | ICD-10-CM | POA: Diagnosis not present

## 2020-06-17 DIAGNOSIS — M48062 Spinal stenosis, lumbar region with neurogenic claudication: Secondary | ICD-10-CM | POA: Diagnosis not present

## 2020-06-17 DIAGNOSIS — M62561 Muscle wasting and atrophy, not elsewhere classified, right lower leg: Secondary | ICD-10-CM | POA: Diagnosis not present

## 2020-06-17 DIAGNOSIS — Z4789 Encounter for other orthopedic aftercare: Secondary | ICD-10-CM | POA: Diagnosis not present

## 2020-06-18 DIAGNOSIS — M62562 Muscle wasting and atrophy, not elsewhere classified, left lower leg: Secondary | ICD-10-CM | POA: Diagnosis not present

## 2020-06-18 DIAGNOSIS — Z4789 Encounter for other orthopedic aftercare: Secondary | ICD-10-CM | POA: Diagnosis not present

## 2020-06-18 DIAGNOSIS — R2689 Other abnormalities of gait and mobility: Secondary | ICD-10-CM | POA: Diagnosis not present

## 2020-06-18 DIAGNOSIS — M4726 Other spondylosis with radiculopathy, lumbar region: Secondary | ICD-10-CM | POA: Diagnosis not present

## 2020-06-18 DIAGNOSIS — R278 Other lack of coordination: Secondary | ICD-10-CM | POA: Diagnosis not present

## 2020-06-18 DIAGNOSIS — M62561 Muscle wasting and atrophy, not elsewhere classified, right lower leg: Secondary | ICD-10-CM | POA: Diagnosis not present

## 2020-06-18 DIAGNOSIS — M48062 Spinal stenosis, lumbar region with neurogenic claudication: Secondary | ICD-10-CM | POA: Diagnosis not present

## 2020-06-18 DIAGNOSIS — M6389 Disorders of muscle in diseases classified elsewhere, multiple sites: Secondary | ICD-10-CM | POA: Diagnosis not present

## 2020-06-19 DIAGNOSIS — M48062 Spinal stenosis, lumbar region with neurogenic claudication: Secondary | ICD-10-CM | POA: Diagnosis not present

## 2020-06-19 DIAGNOSIS — M4726 Other spondylosis with radiculopathy, lumbar region: Secondary | ICD-10-CM | POA: Diagnosis not present

## 2020-06-19 DIAGNOSIS — R2689 Other abnormalities of gait and mobility: Secondary | ICD-10-CM | POA: Diagnosis not present

## 2020-06-19 DIAGNOSIS — Z4789 Encounter for other orthopedic aftercare: Secondary | ICD-10-CM | POA: Diagnosis not present

## 2020-06-19 DIAGNOSIS — M62562 Muscle wasting and atrophy, not elsewhere classified, left lower leg: Secondary | ICD-10-CM | POA: Diagnosis not present

## 2020-06-19 DIAGNOSIS — R278 Other lack of coordination: Secondary | ICD-10-CM | POA: Diagnosis not present

## 2020-06-19 DIAGNOSIS — M62561 Muscle wasting and atrophy, not elsewhere classified, right lower leg: Secondary | ICD-10-CM | POA: Diagnosis not present

## 2020-06-22 DIAGNOSIS — R2689 Other abnormalities of gait and mobility: Secondary | ICD-10-CM | POA: Diagnosis not present

## 2020-06-22 DIAGNOSIS — M48062 Spinal stenosis, lumbar region with neurogenic claudication: Secondary | ICD-10-CM | POA: Diagnosis not present

## 2020-06-22 DIAGNOSIS — M4726 Other spondylosis with radiculopathy, lumbar region: Secondary | ICD-10-CM | POA: Diagnosis not present

## 2020-06-22 DIAGNOSIS — M62562 Muscle wasting and atrophy, not elsewhere classified, left lower leg: Secondary | ICD-10-CM | POA: Diagnosis not present

## 2020-06-22 DIAGNOSIS — M62561 Muscle wasting and atrophy, not elsewhere classified, right lower leg: Secondary | ICD-10-CM | POA: Diagnosis not present

## 2020-06-22 DIAGNOSIS — R278 Other lack of coordination: Secondary | ICD-10-CM | POA: Diagnosis not present

## 2020-06-22 DIAGNOSIS — Z4789 Encounter for other orthopedic aftercare: Secondary | ICD-10-CM | POA: Diagnosis not present

## 2020-06-23 DIAGNOSIS — M62561 Muscle wasting and atrophy, not elsewhere classified, right lower leg: Secondary | ICD-10-CM | POA: Diagnosis not present

## 2020-06-23 DIAGNOSIS — M6389 Disorders of muscle in diseases classified elsewhere, multiple sites: Secondary | ICD-10-CM | POA: Diagnosis not present

## 2020-06-23 DIAGNOSIS — R2689 Other abnormalities of gait and mobility: Secondary | ICD-10-CM | POA: Diagnosis not present

## 2020-06-23 DIAGNOSIS — M4726 Other spondylosis with radiculopathy, lumbar region: Secondary | ICD-10-CM | POA: Diagnosis not present

## 2020-06-23 DIAGNOSIS — Z4789 Encounter for other orthopedic aftercare: Secondary | ICD-10-CM | POA: Diagnosis not present

## 2020-06-23 DIAGNOSIS — M62562 Muscle wasting and atrophy, not elsewhere classified, left lower leg: Secondary | ICD-10-CM | POA: Diagnosis not present

## 2020-06-23 DIAGNOSIS — M48062 Spinal stenosis, lumbar region with neurogenic claudication: Secondary | ICD-10-CM | POA: Diagnosis not present

## 2020-06-23 DIAGNOSIS — R278 Other lack of coordination: Secondary | ICD-10-CM | POA: Diagnosis not present

## 2020-06-24 DIAGNOSIS — M4726 Other spondylosis with radiculopathy, lumbar region: Secondary | ICD-10-CM | POA: Diagnosis not present

## 2020-06-24 DIAGNOSIS — M62561 Muscle wasting and atrophy, not elsewhere classified, right lower leg: Secondary | ICD-10-CM | POA: Diagnosis not present

## 2020-06-24 DIAGNOSIS — R278 Other lack of coordination: Secondary | ICD-10-CM | POA: Diagnosis not present

## 2020-06-24 DIAGNOSIS — R2689 Other abnormalities of gait and mobility: Secondary | ICD-10-CM | POA: Diagnosis not present

## 2020-06-24 DIAGNOSIS — M6389 Disorders of muscle in diseases classified elsewhere, multiple sites: Secondary | ICD-10-CM | POA: Diagnosis not present

## 2020-06-24 DIAGNOSIS — M62562 Muscle wasting and atrophy, not elsewhere classified, left lower leg: Secondary | ICD-10-CM | POA: Diagnosis not present

## 2020-06-24 DIAGNOSIS — Z4789 Encounter for other orthopedic aftercare: Secondary | ICD-10-CM | POA: Diagnosis not present

## 2020-06-24 DIAGNOSIS — M48062 Spinal stenosis, lumbar region with neurogenic claudication: Secondary | ICD-10-CM | POA: Diagnosis not present

## 2020-06-25 DIAGNOSIS — M48062 Spinal stenosis, lumbar region with neurogenic claudication: Secondary | ICD-10-CM | POA: Diagnosis not present

## 2020-06-25 DIAGNOSIS — Z4789 Encounter for other orthopedic aftercare: Secondary | ICD-10-CM | POA: Diagnosis not present

## 2020-06-25 DIAGNOSIS — M62562 Muscle wasting and atrophy, not elsewhere classified, left lower leg: Secondary | ICD-10-CM | POA: Diagnosis not present

## 2020-06-25 DIAGNOSIS — R278 Other lack of coordination: Secondary | ICD-10-CM | POA: Diagnosis not present

## 2020-06-25 DIAGNOSIS — M4726 Other spondylosis with radiculopathy, lumbar region: Secondary | ICD-10-CM | POA: Diagnosis not present

## 2020-06-25 DIAGNOSIS — R2689 Other abnormalities of gait and mobility: Secondary | ICD-10-CM | POA: Diagnosis not present

## 2020-06-25 DIAGNOSIS — M62561 Muscle wasting and atrophy, not elsewhere classified, right lower leg: Secondary | ICD-10-CM | POA: Diagnosis not present

## 2020-06-26 DIAGNOSIS — Z4789 Encounter for other orthopedic aftercare: Secondary | ICD-10-CM | POA: Diagnosis not present

## 2020-06-26 DIAGNOSIS — M4726 Other spondylosis with radiculopathy, lumbar region: Secondary | ICD-10-CM | POA: Diagnosis not present

## 2020-06-26 DIAGNOSIS — M48062 Spinal stenosis, lumbar region with neurogenic claudication: Secondary | ICD-10-CM | POA: Diagnosis not present

## 2020-06-26 DIAGNOSIS — M62562 Muscle wasting and atrophy, not elsewhere classified, left lower leg: Secondary | ICD-10-CM | POA: Diagnosis not present

## 2020-06-26 DIAGNOSIS — R2689 Other abnormalities of gait and mobility: Secondary | ICD-10-CM | POA: Diagnosis not present

## 2020-06-26 DIAGNOSIS — R278 Other lack of coordination: Secondary | ICD-10-CM | POA: Diagnosis not present

## 2020-06-26 DIAGNOSIS — M62561 Muscle wasting and atrophy, not elsewhere classified, right lower leg: Secondary | ICD-10-CM | POA: Diagnosis not present

## 2020-06-29 ENCOUNTER — Non-Acute Institutional Stay (SKILLED_NURSING_FACILITY): Payer: PPO | Admitting: Adult Health

## 2020-06-29 DIAGNOSIS — E893 Postprocedural hypopituitarism: Secondary | ICD-10-CM | POA: Diagnosis not present

## 2020-06-29 DIAGNOSIS — E039 Hypothyroidism, unspecified: Secondary | ICD-10-CM

## 2020-06-29 DIAGNOSIS — N1832 Chronic kidney disease, stage 3b: Secondary | ICD-10-CM

## 2020-06-29 DIAGNOSIS — M4726 Other spondylosis with radiculopathy, lumbar region: Secondary | ICD-10-CM | POA: Diagnosis not present

## 2020-06-29 DIAGNOSIS — M6389 Disorders of muscle in diseases classified elsewhere, multiple sites: Secondary | ICD-10-CM | POA: Diagnosis not present

## 2020-06-29 DIAGNOSIS — M62562 Muscle wasting and atrophy, not elsewhere classified, left lower leg: Secondary | ICD-10-CM | POA: Diagnosis not present

## 2020-06-29 DIAGNOSIS — I10 Essential (primary) hypertension: Secondary | ICD-10-CM | POA: Diagnosis not present

## 2020-06-29 DIAGNOSIS — Z4789 Encounter for other orthopedic aftercare: Secondary | ICD-10-CM | POA: Diagnosis not present

## 2020-06-29 DIAGNOSIS — M48062 Spinal stenosis, lumbar region with neurogenic claudication: Secondary | ICD-10-CM

## 2020-06-29 DIAGNOSIS — E1142 Type 2 diabetes mellitus with diabetic polyneuropathy: Secondary | ICD-10-CM | POA: Diagnosis not present

## 2020-06-29 DIAGNOSIS — R2689 Other abnormalities of gait and mobility: Secondary | ICD-10-CM | POA: Diagnosis not present

## 2020-06-29 DIAGNOSIS — R278 Other lack of coordination: Secondary | ICD-10-CM | POA: Diagnosis not present

## 2020-06-29 DIAGNOSIS — M62561 Muscle wasting and atrophy, not elsewhere classified, right lower leg: Secondary | ICD-10-CM | POA: Diagnosis not present

## 2020-06-29 DIAGNOSIS — H401190 Primary open-angle glaucoma, unspecified eye, stage unspecified: Secondary | ICD-10-CM

## 2020-06-30 ENCOUNTER — Encounter: Payer: Self-pay | Admitting: Adult Health

## 2020-06-30 DIAGNOSIS — M62562 Muscle wasting and atrophy, not elsewhere classified, left lower leg: Secondary | ICD-10-CM | POA: Diagnosis not present

## 2020-06-30 DIAGNOSIS — R278 Other lack of coordination: Secondary | ICD-10-CM | POA: Diagnosis not present

## 2020-06-30 DIAGNOSIS — M48062 Spinal stenosis, lumbar region with neurogenic claudication: Secondary | ICD-10-CM | POA: Diagnosis not present

## 2020-06-30 DIAGNOSIS — M62561 Muscle wasting and atrophy, not elsewhere classified, right lower leg: Secondary | ICD-10-CM | POA: Diagnosis not present

## 2020-06-30 DIAGNOSIS — M4726 Other spondylosis with radiculopathy, lumbar region: Secondary | ICD-10-CM | POA: Diagnosis not present

## 2020-06-30 DIAGNOSIS — R2689 Other abnormalities of gait and mobility: Secondary | ICD-10-CM | POA: Diagnosis not present

## 2020-06-30 DIAGNOSIS — Z4789 Encounter for other orthopedic aftercare: Secondary | ICD-10-CM | POA: Diagnosis not present

## 2020-06-30 NOTE — Progress Notes (Signed)
Location:  Occupational psychologist of Service:  SNF (31) Provider:   Cindi Carbon, ANP [facility] [phone]   [name], DO  Patient Care Team: [name], DO as PCP - General (Geriatric Medicine) Community[name], [name], MD as Referring Physician (Endocrinology) [name], MD as Consulting Physician (Orthopedic Surgery) Bond, Tracie Harrier, MD as Referring Physician (Ophthalmology) Magnus Sinning, MD as Consulting Physician (Physical Medicine and Rehabilitation)  Extended Emergency Contact Information Primary Emergency Contact: [name],[name] [name] Address: [address]          [place], [name] [address] [name] of [place] Phone: [phone] Mobile Phone: [phone] Relation: Son Secondary Emergency Contact: [name], [place] [address] [name] of [place] Phone: [phone] Mobile Phone: [phone] Relation: Spouse  Code Status:  DNR Goals of care: Advanced Directive information Advanced Directives 04/28/2020  Does Patient Have a Medical Advance Directive? Yes  Type of Advance Directive Living will;Out of facility DNR (pink MOST or yellow form)  Does patient want to make changes to medical advance directive? No - Guardian declined  Copy of [place] in Chart? -  Would patient like information on creating a medical advance directive? -  Pre-existing out of facility DNR order (yellow form or pink MOST form) Pink MOST/Yellow Form most recent copy in chart - Physician notified to receive inpatient order     Chief Complaint  Patient presents with  . Medical Management of Chronic Issues    HPI:  Pt is a 86 y.o. male seen today for medical management of chronic diseases.    [name] [name] moved to skilled care and is adjusting well. He is s/p lumbar fusion on 8/24.  He is walking with therapy and making gains but still needs additional  assistance. He is not having any pain which was a major reason for the surgery. He reports he would love to continue to improve and go home but he doesn't think he will. He reports that his vision is declining and he is followed by [name]. Has tried several types of glasses by his account. States he needs to follow back up with them. He is not having any eye pain. Has intermittent redness and drainage and uses systane and erythromycin.  He reports that he likes going to [name] and wants him to draw and monitor all his labs not [facility].He did have a CMP in Sept BUN/Cr 20/1.1.  Mild anemia on CBC Hgb 10.8 05/05/20 The staff wanted me to check his left great toe which is chronically red. He has some neuropathy but denies pain. Also no warmth, fever, or drainage to the area.    Past Medical History:  Diagnosis Date  . Allergic rhinitis    Prior allergy shots 20 years  . Altered mental status    when pain is unmanaged or while on pain meds   . Anemia   . Anemia, B12 deficiency 2000  . Arthritis    Status post left total replacement 8 2011  . BPH (benign prostatic hyperplasia) 2013  . Cancer ([name])    renal cell ca and skin cancer   . CKD (chronic kidney disease)    stage 3  . Colitis 2014  . Diabetes mellitus without complication ([place])    type 2 - no meds  . Diabetic polyneuropathy associated with type 2 diabetes mellitus ([place])   . Difficult intubation 1994  surgery had to be  stopped due to injury to "throat"  . Difficult intubation 1994   no trouble since.  Required nasotracheal intubation  '02 [facility]; awake intubation 12/15/11  . GERD (gastroesophageal reflux disease)   . Glaucoma 2007   Status post left trabeculectomy 2007  . History of colon polyps    Colonoscopy 2001  . History of kidney stones   . History of renal cell carcinoma 1994   Status post right nephrectomy  . Hyperlipidemia 2003  . Hypertension   . 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 Hypothyroidism (acquired) 2000  . Nocturia   . Osteomyelitis ([place])    right great  . Osteomyelitis ([place])    RIGHT FOOT /TOE  . Peripheral vascular disease ([place])   . Pituitary mass ([place]) 2000   S/p transphenoidal excision 05/1999 ([facility])  . Vitamin D deficiency 2009   Past Surgical History:  Procedure Laterality Date  . AMPUTATION Right 12/20/2017   Procedure: RIGHT 1ST RAY AMPUTATION;  Surgeon: [name], MD;  Location: [place];  Service: Orthopedics;  Laterality: Right;  . AMPUTATION Right 03/23/2018   Procedure: RIGHT 2ND TOE AMPUTATION;  Surgeon: [name], MD;  Location: [place];  Service: Orthopedics;  Laterality: Right;  . BRAIN SURGERY  2000   pituatary gland removed .  Marland Kitchen CHOLECYSTECTOMY  1984  . Ectropion surgery Bilateral 2006  . EYE SURGERY  over last 6 yrs.     trabeculectomy...   . EYE SURGERY   cat ext ou  . JOINT REPLACEMENT  2011   knee left  . KNEE ARTHROSCOPY Right 02/06/2015   Procedure: RIGHT ARTHROSCOPY KNEE WITH LATERAL MENSICAL  DEBRIDEMENT;  Surgeon: Gaynelle Arabian, MD;  Location: [facility];  Service: Orthopedics;  Laterality: Right;  . Mohs procedure      for skin cancer on nose   . NEPHRECTOMY Right 1994   Renal cell  . REVERSE SHOULDER ARTHROPLASTY  12/15/2011   Procedure: REVERSE SHOULDER ARTHROPLASTY;  Surgeon: Marin Shutter, MD;  Location: [facility];  Service: Orthopedics;  Laterality: Right;  right total reverse shoulder  . TONSILLECTOMY    . TOTAL KNEE ARTHROPLASTY Right 06/29/2015   Procedure: TOTAL KNEE ARTHROPLASTY;  Surgeon: Gaynelle Arabian, MD;  Location: [facility];  Service: Orthopedics;  Laterality: Right;  . Transsphenoidal excision pituitary tumor  05/1999   A.Tommi Rumps, M.D.([facility])    Allergies  Allergen Reactions  . Adhesive [Tape] Other (See Comments)    CAUSES SKIN TEARS, prefers paper tape   . Lyrica [Pregabalin] Swelling  . Percocet [Oxycodone-Acetaminophen] Other (See Comments)    UNSPECIFIED REACTION   Does not want to take  . Robaxin [Methocarbamol]     UNSPECIFIED REACTION   . Diovan [Valsartan] Rash    Outpatient Encounter Medications as of 06/29/2020  Medication Sig  . acetaminophen (TYLENOL) 500 MG tablet Take 1,000 mg by mouth 3 (three) times daily as needed for moderate pain.   . Coenzyme Q10 300 MG CAPS Take 300 mg by mouth daily.   . Diclofenac Sodium 1.5 % SOLN Dispense 10 to 40 drops to affected area on skin as directed, twice per day  . finasteride (PROSCAR) 5 MG tablet Take 5 mg by mouth daily.   . Flaxseed, Linseed, (FLAXSEED OIL) 1000 MG CAPS Take 1,000 mg by mouth daily.  . fluticasone (FLONASE) 50 MCG/ACT nasal spray Place 2 sprays into both nostrils daily.  . mirabegron ER (MYRBETRIQ) 25  MG TB24 tablet Take 25 mg by mouth every other day.  . Multiple Vitamins-Minerals (MULTIVITAMIN & MINERAL PO) Take 1 tablet by mouth daily.  Marland Kitchen nystatin-triamcinolone (MYCOLOG II) cream Apply 1 application topically 2 (two) times daily.  . polyethylene glycol (MIRALAX / GLYCOLAX) 17 g packet Take 17 g by mouth daily.  . pravastatin (PRAVACHOL) 80 MG tablet Take 80 mg by mouth every other day.   . predniSONE (DELTASONE) 5 MG tablet Take 2.5-5 mg by mouth See admin instructions. Take 5 mg by mouth in the morning and 2.5 mg in the evening  . SYNTHROID 88 MCG tablet Take 88 mcg by mouth daily before breakfast.   . testosterone cypionate (DEPOTESTOSTERONE CYPIONATE) 200 MG/ML injection Inject 30 mg into the muscle every Friday.   . timolol (TIMOPTIC) 0.5 % ophthalmic solution Place 1 drop into the right eye daily.  . traMADol (ULTRAM) 50 MG tablet Take 0.5-1 tablets (25-50 mg total) by mouth every 6 (six) hours as needed. 25 for mild to moderate pain, 50 for severe pain  . vitamin B-12 (CYANOCOBALAMIN) 500 MCG tablet Take 500 mcg by mouth daily.  . Cholecalciferol (VITAMIN D) 2000 units tablet Take 2,000 Units by mouth daily.  Vladimir Faster Glycol-Propyl Glycol (SYSTANE) 0.4-0.3 % SOLN Apply 1  drop to eye daily. Left eye   No facility-administered encounter medications on file as of 06/29/2020.    Review of Systems  Constitutional: Negative for activity change, appetite change, chills, diaphoresis, fatigue, fever and unexpected weight change.  Eyes: Positive for discharge (intermittent ). Negative for photophobia, pain, redness and itching.       Vision worsening per pt over the past year, glasses don't help  Respiratory: Negative for cough, shortness of breath, wheezing and stridor.   Cardiovascular: Positive for leg swelling. Negative for chest pain and palpitations.  Gastrointestinal: Negative for abdominal distention, abdominal pain, constipation and diarrhea.  Genitourinary: Negative for difficulty urinating and dysuria.  Musculoskeletal: Positive for gait problem. Negative for arthralgias, back pain, joint swelling and myalgias.  Skin: Positive for color change (chronic redness to left gret toe). Negative for wound.  Neurological: Negative for dizziness, seizures, syncope, facial asymmetry, speech difficulty, weakness and headaches.  Hematological: Negative for adenopathy. Does not bruise/bleed easily.  Psychiatric/Behavioral: Positive for confusion. Negative for agitation and behavioral problems.    Immunization History  Administered Date(s) Administered  . Fluad Quad(high Dose 65+) 04/30/2019  . Influenza, High Dose Seasonal PF 04/30/2017, 06/08/2018  . Influenza, Quadrivalent, Recombinant, Inj, Pf 05/19/2016  . Moderna SARS-COVID-2 Vaccination 09/10/2019, 10/08/2019  . Pneumococcal Conjugate-13 06/04/2015  . Pneumococcal Polysaccharide-23 06/29/2010  . Tdap 10/28/1998, 06/08/2018  . Zoster 10/27/2005  . Zoster Recombinat (Shingrix) 02/04/2018, 07/02/2018   Pertinent  Health Maintenance Due  Topic Date Due  . HEMOGLOBIN A1C  09/20/2018  . INFLUENZA VACCINE  03/29/2020  . OPHTHALMOLOGY EXAM  06/04/2020  . URINE MICROALBUMIN  07/30/2020 (Originally 01/22/1933)  .  FOOT EXAM  02/09/2021  . PNA vac Low Risk Adult  Completed   Fall Risk  07/24/2019 07/17/2019 07/05/2019 03/13/2019 11/14/2018  Falls in the past year? 0 0 0 0 1  Comment Emmi Telephone Survey: data to providers prior to load - - - -  Number falls in past yr: - 0 0 0 0  Comment - - - - -  Injury with Fall? - 0 0 0 1  Risk Factor Category  - - - - -  Risk for fall due to : - - - - -  Functional Status Survey:    Vitals:   06/30/20 1036  Weight: 170 lb 12.8 oz (77.5 kg)   Body mass index is 23.82 kg/m. Physical Exam Vitals and nursing note reviewed.  Constitutional:      General: He is not in acute distress.    Appearance: He is not diaphoretic.  HENT:     Head: Normocephalic and atraumatic.  Neck:     Thyroid: No thyromegaly.     Vascular: No JVD.     Trachea: No tracheal deviation.  Cardiovascular:     Rate and Rhythm: Normal rate and regular rhythm.     Heart sounds: No murmur heard.   Pulmonary:     Effort: Pulmonary effort is normal. No respiratory distress.     Breath sounds: No wheezing or rales.  Abdominal:     General: Bowel sounds are normal. There is no distension.     Palpations: Abdomen is soft.     Tenderness: There is no abdominal tenderness.  Musculoskeletal:        General: No swelling, tenderness, deformity or signs of injury.     Right lower leg: No edema (+1).     Left lower leg: No edema (+1).     Comments: Strength BLE 4/5.  MAE  Lymphadenopathy:     Cervical: No cervical adenopathy.  Skin:    General: Skin is warm and dry.     Findings: Erythema (left great toe with closed wound on the anterior portion of the great toe. No spreading redness, drainage or warmth) present.     Comments: Left great toe with silver polymem in place. No drainage, redness or swelling noted. Left heel with silver polymem in place. No drainage swelling or redness noted  Neurological:     Mental Status: He is alert and oriented to person, place, and time.     Cranial  Nerves: No cranial nerve deficit.  Psychiatric:        Mood and Affect: Mood normal.     Labs reviewed: Recent Labs    08/29/19 0000 08/29/19 0000 02/11/20 0000 05/04/20 0000 05/08/20 0000  NA 139   < > 136* 128*  128* 137  K 3.9  --   --  4.3  4.3 4.4  CL 101  --   --  91*  91* 100  CO2 33*   < > 27* 28*  28* 32*  BUN 25*   < > 19 21  21 20   CREATININE 1.2   < > 0.9 1.1  1.1 1.1  CALCIUM 9.4   < > 9.3 9.0  9.0 9.7   < > = values in this interval not displayed.   Recent Labs    08/29/19 0000  ALBUMIN 3.6   Recent Labs    04/21/20 0628 05/04/20 0000 05/05/20 0000  WBC 13.4* 15.6  15.6 9.9  NEUTROABS 8.6* 12 6  HGB 13.4 10.7*  10.7* 10.8*  HCT 42.5 33*  33* 32*  MCV 102.2*  --   --   PLT 289 237  237 250   Lab Results  Component Value Date   TSH <0.010 (L) 03/20/2018   Lab Results  Component Value Date   HGBA1C 6.5 (H) 03/20/2018   Lab Results  Component Value Date   CHOL 138 08/29/2019   HDL 2 (A) 08/29/2019   LDLCALC 61 08/29/2019   TRIG 72 08/29/2019    Significant Diagnostic Results in last 30 days:  No results found.  Assessment/Plan  1. Stage 3b chronic kidney disease ([place]) With prior hx of right nephrectomy Continue to periodically monitor BMP and avoid nephrotoxic agents  2. Primary open angle glaucoma, unspecified glaucoma stage, unspecified laterality No current pain but vision is declining Recommend f/u with ophthalmology   3. Spinal stenosis of lumbar region with neurogenic claudication S/P lumbar fusion Pain resolved Continues to work with PT and make gains   4. 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 Hypothyroidism (acquired) Followed by endocrinology  Continue Synthroid 88 mcg qd   6. Diabetic polyneuropathy associated with type 2 diabetes mellitus ([place]) Diet controlled  Needs A1C   7. Primary hypertension Controlled    Family/ staff Communication: discussed with the  resident   Labs/tests ordered: He wants his labs done at Dr. Darryl Nestle office. Needs to make apt

## 2020-07-01 DIAGNOSIS — R278 Other lack of coordination: Secondary | ICD-10-CM | POA: Diagnosis not present

## 2020-07-01 DIAGNOSIS — M4726 Other spondylosis with radiculopathy, lumbar region: Secondary | ICD-10-CM | POA: Diagnosis not present

## 2020-07-01 DIAGNOSIS — M62561 Muscle wasting and atrophy, not elsewhere classified, right lower leg: Secondary | ICD-10-CM | POA: Diagnosis not present

## 2020-07-01 DIAGNOSIS — Z4789 Encounter for other orthopedic aftercare: Secondary | ICD-10-CM | POA: Diagnosis not present

## 2020-07-01 DIAGNOSIS — M62562 Muscle wasting and atrophy, not elsewhere classified, left lower leg: Secondary | ICD-10-CM | POA: Diagnosis not present

## 2020-07-01 DIAGNOSIS — M48062 Spinal stenosis, lumbar region with neurogenic claudication: Secondary | ICD-10-CM | POA: Diagnosis not present

## 2020-07-01 DIAGNOSIS — R2689 Other abnormalities of gait and mobility: Secondary | ICD-10-CM | POA: Diagnosis not present

## 2020-07-01 DIAGNOSIS — M6389 Disorders of muscle in diseases classified elsewhere, multiple sites: Secondary | ICD-10-CM | POA: Diagnosis not present

## 2020-07-02 DIAGNOSIS — R2689 Other abnormalities of gait and mobility: Secondary | ICD-10-CM | POA: Diagnosis not present

## 2020-07-02 DIAGNOSIS — M4726 Other spondylosis with radiculopathy, lumbar region: Secondary | ICD-10-CM | POA: Diagnosis not present

## 2020-07-02 DIAGNOSIS — M62561 Muscle wasting and atrophy, not elsewhere classified, right lower leg: Secondary | ICD-10-CM | POA: Diagnosis not present

## 2020-07-02 DIAGNOSIS — M62562 Muscle wasting and atrophy, not elsewhere classified, left lower leg: Secondary | ICD-10-CM | POA: Diagnosis not present

## 2020-07-02 DIAGNOSIS — R278 Other lack of coordination: Secondary | ICD-10-CM | POA: Diagnosis not present

## 2020-07-02 DIAGNOSIS — M48062 Spinal stenosis, lumbar region with neurogenic claudication: Secondary | ICD-10-CM | POA: Diagnosis not present

## 2020-07-02 DIAGNOSIS — Z4789 Encounter for other orthopedic aftercare: Secondary | ICD-10-CM | POA: Diagnosis not present

## 2020-07-03 DIAGNOSIS — M62562 Muscle wasting and atrophy, not elsewhere classified, left lower leg: Secondary | ICD-10-CM | POA: Diagnosis not present

## 2020-07-03 DIAGNOSIS — M4726 Other spondylosis with radiculopathy, lumbar region: Secondary | ICD-10-CM | POA: Diagnosis not present

## 2020-07-03 DIAGNOSIS — M48062 Spinal stenosis, lumbar region with neurogenic claudication: Secondary | ICD-10-CM | POA: Diagnosis not present

## 2020-07-03 DIAGNOSIS — R278 Other lack of coordination: Secondary | ICD-10-CM | POA: Diagnosis not present

## 2020-07-03 DIAGNOSIS — R2689 Other abnormalities of gait and mobility: Secondary | ICD-10-CM | POA: Diagnosis not present

## 2020-07-03 DIAGNOSIS — Z4789 Encounter for other orthopedic aftercare: Secondary | ICD-10-CM | POA: Diagnosis not present

## 2020-07-03 DIAGNOSIS — M62561 Muscle wasting and atrophy, not elsewhere classified, right lower leg: Secondary | ICD-10-CM | POA: Diagnosis not present

## 2020-07-06 ENCOUNTER — Non-Acute Institutional Stay (SKILLED_NURSING_FACILITY): Payer: PPO | Admitting: Adult Health

## 2020-07-06 ENCOUNTER — Encounter: Payer: Self-pay | Admitting: Adult Health

## 2020-07-06 DIAGNOSIS — M62562 Muscle wasting and atrophy, not elsewhere classified, left lower leg: Secondary | ICD-10-CM | POA: Diagnosis not present

## 2020-07-06 DIAGNOSIS — M4726 Other spondylosis with radiculopathy, lumbar region: Secondary | ICD-10-CM | POA: Diagnosis not present

## 2020-07-06 DIAGNOSIS — M48062 Spinal stenosis, lumbar region with neurogenic claudication: Secondary | ICD-10-CM | POA: Diagnosis not present

## 2020-07-06 DIAGNOSIS — I872 Venous insufficiency (chronic) (peripheral): Secondary | ICD-10-CM

## 2020-07-06 DIAGNOSIS — R2689 Other abnormalities of gait and mobility: Secondary | ICD-10-CM | POA: Diagnosis not present

## 2020-07-06 DIAGNOSIS — M6389 Disorders of muscle in diseases classified elsewhere, multiple sites: Secondary | ICD-10-CM | POA: Diagnosis not present

## 2020-07-06 DIAGNOSIS — R278 Other lack of coordination: Secondary | ICD-10-CM | POA: Diagnosis not present

## 2020-07-06 DIAGNOSIS — Z4789 Encounter for other orthopedic aftercare: Secondary | ICD-10-CM | POA: Diagnosis not present

## 2020-07-06 DIAGNOSIS — M62561 Muscle wasting and atrophy, not elsewhere classified, right lower leg: Secondary | ICD-10-CM | POA: Diagnosis not present

## 2020-07-06 NOTE — Progress Notes (Signed)
Location:  Occupational psychologist of Service:  SNF (31) Provider:   Cindi Carbon, ANP [place] [phone]   [name], DO  Patient Care Team: [name], DO as PCP - General (Geriatric Medicine) [name], [name], MD as Referring Physician (Endocrinology) [name], MD as Consulting Physician (Orthopedic Surgery) Bond, Tracie Harrier, MD as Referring Physician (Ophthalmology) Magnus Sinning, MD as Consulting Physician (Physical Medicine and Rehabilitation)  Extended Emergency Contact Information Primary Emergency Contact: Sarr,[name] Address: [address]          [place], [place] [address] [name] of [place] Phone: [phone] Mobile Phone: [phone] Relation: Son Secondary Emergency Contact: [name], [place] [address] [name] of [place] Phone: [phone] Mobile Phone: [phone] Relation: Spouse  Code Status:  DNR Goals of care: Advanced Directive information Advanced Directives 04/28/2020  Does Patient Have a Medical Advance Directive? Yes  Type of Advance Directive Living will;Out of facility DNR (pink MOST or yellow form)  Does patient want to make changes to medical advance directive? No - Guardian declined  Copy of [place] in Chart? -  Would patient like information on creating a medical advance directive? -  Pre-existing out of facility DNR order (yellow form or pink MOST form) Pink MOST/Yellow Form most recent copy in chart - Physician notified to receive inpatient order     Chief Complaint  Patient presents with  . Acute Visit    edema    HPI:  Pt is a 86 y.o. male seen today for an acute visit for edema. Nurse reports over the weekend bilateral lower ext became more swollen. He has an area of redness to his left toe with a healed wound. No increase in drainage or pain to this area. No fever or  increased redness to the legs is noted. He has some weeping to the right lower ext. He is not having any sob, pnd, doe, or low 02 sats. Vitals are stable. He refused to be weighed today. He is in bed for my visit with his legs elevated on a basket doing leg exercises. He wears compression hose. His wife doesn't like diuretics so he wants to avoid them. He has a hx of venous insuff. Echo reviewed from 2019 showing grade 1 DD with normal EF. ABI normal in 2019 reviewed as wel.    Past Medical History:  Diagnosis Date  . Allergic rhinitis    Prior allergy shots 20 years  . Altered mental status    when pain is unmanaged or while on pain meds   . Anemia   . Anemia, B12 deficiency 2000  . Arthritis    Status post left total replacement 8 2011  . BPH (benign prostatic hyperplasia) 2013  . Cancer ([place])    renal cell ca and skin cancer   . CKD (chronic kidney disease)    stage 3  . Colitis 2014  . Diabetes mellitus without complication ([place])    type 2 - no meds  . Diabetic polyneuropathy associated with type 2 diabetes mellitus ([place])   . Difficult intubation 1994   surgery had to be  stopped due to injury to "throat"  . Difficult intubation 1994   no trouble since.  Required nasotracheal intubation  '02 [facility] awake intubation 12/15/11  . GERD (gastroesophageal reflux disease)   . Glaucoma 2007   Status post left trabeculectomy  2007  . History of colon polyps    Colonoscopy 2001  . History of kidney stones   . History of renal cell carcinoma 1994   Status post right nephrectomy  . Hyperlipidemia 2003  . Hypertension   . 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 Hypothyroidism (acquired) 2000  . Nocturia   . Osteomyelitis ([place])    right great  . Osteomyelitis ([place])    RIGHT FOOT /TOE  . Peripheral vascular disease ([place])   . Pituitary mass ([place]) 2000   S/p transphenoidal excision 05/1999 ([facility])  . Vitamin D deficiency 2009    Past Surgical History:  Procedure Laterality Date  . AMPUTATION Right 12/20/2017   Procedure: RIGHT 1ST RAY AMPUTATION;  Surgeon: [name], MD;  Location: [facility] [facility];  Service: Orthopedics;  Laterality: Right;  . AMPUTATION Right 03/23/2018   Procedure: RIGHT 2ND TOE AMPUTATION;  Surgeon: [name], MD;  Location: [facility];  Service: Orthopedics;  Laterality: Right;  . BRAIN SURGERY  2000   pituatary gland removed .  Marland Kitchen CHOLECYSTECTOMY  1984  . Ectropion surgery Bilateral 2006  . EYE SURGERY  over last 6 yrs.     trabeculectomy...   . EYE SURGERY   cat ext ou  . JOINT REPLACEMENT  2011   knee left  . KNEE ARTHROSCOPY Right 02/06/2015   Procedure: RIGHT ARTHROSCOPY KNEE WITH LATERAL MENSICAL  DEBRIDEMENT;  Surgeon: Gaynelle Arabian, MD;  Location: [facility];  Service: Orthopedics;  Laterality: Right;  . Mohs procedure      for skin cancer on nose   . NEPHRECTOMY Right 1994   Renal cell  . REVERSE SHOULDER ARTHROPLASTY  12/15/2011   Procedure: REVERSE SHOULDER ARTHROPLASTY;  Surgeon: Marin Shutter, MD;  Location: [place];  Service: Orthopedics;  Laterality: Right;  right total reverse shoulder  . TONSILLECTOMY    . TOTAL KNEE ARTHROPLASTY Right 06/29/2015   Procedure: TOTAL KNEE ARTHROPLASTY;  Surgeon: Gaynelle Arabian, MD;  Location: [facility];  Service: Orthopedics;  Laterality: Right;  . Transsphenoidal excision pituitary tumor  05/1999   A.Tommi Rumps, M.D.([facility])    Allergies  Allergen Reactions  . Adhesive [Tape] Other (See Comments)    CAUSES SKIN TEARS, prefers paper tape   . Lyrica [Pregabalin] Swelling  . Percocet [Oxycodone-Acetaminophen] Other (See Comments)    UNSPECIFIED REACTION  Does not want to take  . Robaxin [Methocarbamol]     UNSPECIFIED REACTION   . Diovan [Valsartan] Rash    Outpatient Encounter Medications as of 07/06/2020  Medication Sig  . acetaminophen (TYLENOL) 500 MG tablet Take 1,000 mg by mouth 3 (three) times daily as needed for moderate pain.   .  Cholecalciferol (VITAMIN D) 2000 units tablet Take 2,000 Units by mouth daily.  . Coenzyme Q10 300 MG CAPS Take 300 mg by mouth daily.   . Diclofenac Sodium 1.5 % SOLN Dispense 10 to 40 drops to affected area on skin as directed, twice per day  . finasteride (PROSCAR) 5 MG tablet Take 5 mg by mouth daily.   . Flaxseed, Linseed, (FLAXSEED OIL) 1000 MG CAPS Take 1,000 mg by mouth daily.  . fluticasone (FLONASE) 50 MCG/ACT nasal spray Place 2 sprays into both nostrils daily.  . mirabegron ER (MYRBETRIQ) 25 MG TB24 tablet Take 25 mg by mouth every other day.  . Multiple Vitamins-Minerals (MULTIVITAMIN & MINERAL PO) Take 1 tablet by mouth daily.  Vladimir Faster Glycol-Propyl Glycol (SYSTANE) 0.4-0.3 % SOLN  Apply 1 drop to eye daily. Left eye  . polyethylene glycol (MIRALAX / GLYCOLAX) 17 g packet Take 17 g by mouth daily.  . pravastatin (PRAVACHOL) 80 MG tablet Take 80 mg by mouth every other day.   . predniSONE (DELTASONE) 5 MG tablet Take 2.5-5 mg by mouth See admin instructions. Take 5 mg by mouth in the morning and 2.5 mg in the evening  . SYNTHROID 88 MCG tablet Take 88 mcg by mouth daily before breakfast.   . testosterone cypionate (DEPOTESTOSTERONE CYPIONATE) 200 MG/ML injection Inject 30 mg into the muscle every Friday.   . timolol (TIMOPTIC) 0.5 % ophthalmic solution Place 1 drop into the right eye daily.  . traMADol (ULTRAM) 50 MG tablet Take 0.5-1 tablets (25-50 mg total) by mouth every 6 (six) hours as needed. 25 for mild to moderate pain, 50 for severe pain  . vitamin B-12 (CYANOCOBALAMIN) 500 MCG tablet Take 500 mcg by mouth daily.  . [DISCONTINUED] nystatin-triamcinolone (MYCOLOG II) cream Apply 1 application topically 2 (two) times daily.   No facility-administered encounter medications on file as of 07/06/2020.    Review of Systems  Constitutional: Negative for activity change, appetite change, chills, diaphoresis, fatigue and fever.  HENT: Negative for congestion.   Respiratory:  Negative for cough, shortness of breath and wheezing.   Cardiovascular: Positive for leg swelling. Negative for chest pain and palpitations.  Gastrointestinal: Negative for abdominal distention, constipation and diarrhea.  Genitourinary: Negative for difficulty urinating.  Musculoskeletal: Positive for gait problem.  Skin: Positive for wound (healed to heel and toe). Negative for color change, pallor and rash.    Immunization History  Administered Date(s) Administered  . Fluad Quad(high Dose 65+) 04/30/2019  . Influenza, High Dose Seasonal PF 04/30/2017, 06/08/2018  . Influenza, Quadrivalent, Recombinant, Inj, Pf 05/19/2016  . Moderna SARS-COVID-2 Vaccination 09/10/2019, 10/08/2019  . Pneumococcal Conjugate-13 06/04/2015  . Pneumococcal Polysaccharide-23 06/29/2010  . Tdap 10/28/1998, 06/08/2018  . Zoster 10/27/2005  . Zoster Recombinat (Shingrix) 02/04/2018, 07/02/2018   Pertinent  Health Maintenance Due  Topic Date Due  . HEMOGLOBIN A1C  09/20/2018  . INFLUENZA VACCINE  03/29/2020  . OPHTHALMOLOGY EXAM  06/04/2020  . URINE MICROALBUMIN  07/30/2020 (Originally 01/22/1933)  . FOOT EXAM  02/09/2021  . PNA vac Low Risk Adult  Completed   Fall Risk  07/24/2019 07/17/2019 07/05/2019 03/13/2019 11/14/2018  Falls in the past year? 0 0 0 0 1  Comment Emmi Telephone Survey: data to providers prior to load - - - -  Number falls in past yr: - 0 0 0 0  Comment - - - - -  Injury with Fall? - 0 0 0 1  Risk Factor Category  - - - - -  Risk for fall due to : - - - - -   Functional Status Survey:    Vitals:   07/06/20 1120  BP: 113/67  Pulse: 83  Resp: 17  Temp: 97.7 F (36.5 C)  SpO2: 97%   There is no height or weight on file to calculate BMI. Physical Exam Vitals and nursing note reviewed.  Constitutional:      General: He is not in acute distress.    Appearance: He is not diaphoretic.  HENT:     Head: Normocephalic and atraumatic.  Neck:     Thyroid: No thyromegaly.      Vascular: No JVD.     Trachea: No tracheal deviation.  Cardiovascular:     Rate and Rhythm: Normal rate and regular  rhythm.     Heart sounds: No murmur heard.   Pulmonary:     Effort: Pulmonary effort is normal. No respiratory distress.     Breath sounds: Normal breath sounds. No wheezing.  Abdominal:     General: Bowel sounds are normal. There is no distension.     Palpations: Abdomen is soft.     Tenderness: There is no abdominal tenderness.  Musculoskeletal:        General: No tenderness, deformity or signs of injury.     Right lower leg: Edema (+3) present.     Left lower leg: Edema (_+3) present.  Lymphadenopathy:     Cervical: No cervical adenopathy.  Skin:    General: Skin is warm and dry.     Comments: Pink color noted to the right leg, normal skin tone to the left leg. Weeping noted to posterior left thigh. No warmth, no purulent drainage or pustules. No tenderness. Left great toe with mild erythema not changed over time.   Neurological:     Mental Status: He is alert and oriented to person, place, and time.     Cranial Nerves: No cranial nerve deficit.     Labs reviewed: Recent Labs    08/29/19 0000 08/29/19 0000 02/11/20 0000 05/04/20 0000 05/08/20 0000  NA 139   < > 136* 128*  128* 137  K 3.9  --   --  4.3  4.3 4.4  CL 101  --   --  91*  91* 100  CO2 33*   < > 27* 28*  28* 32*  BUN 25*   < > 19 21  21 20   CREATININE 1.2   < > 0.9 1.1  1.1 1.1  CALCIUM 9.4   < > 9.3 9.0  9.0 9.7   < > = values in this interval not displayed.   Recent Labs    08/29/19 0000  ALBUMIN 3.6   Recent Labs    04/21/20 0628 05/04/20 0000 05/05/20 0000  WBC 13.4* 15.6  15.6 9.9  NEUTROABS 8.6* 12 6  HGB 13.4 10.7*  10.7* 10.8*  HCT 42.5 33*  33* 32*  MCV 102.2*  --   --   PLT 289 237  237 250   Lab Results  Component Value Date   TSH <0.010 (L) 03/20/2018   Lab Results  Component Value Date   HGBA1C 6.5 (H) 03/20/2018   Lab Results  Component Value  Date   CHOL 138 08/29/2019   HDL 2 (A) 08/29/2019   LDLCALC 61 08/29/2019   TRIG 72 08/29/2019    Significant Diagnostic Results in last 30 days:  No results found.  Assessment/Plan 1. Chronic venous insufficiency of lower extremity  Apply double layer compression wraps on in the am and off in the pm Monitor weight twice weekly for one month  Encourage elevation   Family/ staff Communication: discussed with Mr. Niebuhr and his nurse Jasmine   Labs/tests ordered:  NA

## 2020-07-07 ENCOUNTER — Other Ambulatory Visit: Payer: Self-pay

## 2020-07-07 ENCOUNTER — Encounter: Payer: PPO | Admitting: Family

## 2020-07-08 DIAGNOSIS — M62562 Muscle wasting and atrophy, not elsewhere classified, left lower leg: Secondary | ICD-10-CM | POA: Diagnosis not present

## 2020-07-08 DIAGNOSIS — R278 Other lack of coordination: Secondary | ICD-10-CM | POA: Diagnosis not present

## 2020-07-08 DIAGNOSIS — M4726 Other spondylosis with radiculopathy, lumbar region: Secondary | ICD-10-CM | POA: Diagnosis not present

## 2020-07-08 DIAGNOSIS — M48062 Spinal stenosis, lumbar region with neurogenic claudication: Secondary | ICD-10-CM | POA: Diagnosis not present

## 2020-07-08 DIAGNOSIS — Z4789 Encounter for other orthopedic aftercare: Secondary | ICD-10-CM | POA: Diagnosis not present

## 2020-07-08 DIAGNOSIS — M6389 Disorders of muscle in diseases classified elsewhere, multiple sites: Secondary | ICD-10-CM | POA: Diagnosis not present

## 2020-07-08 DIAGNOSIS — R2689 Other abnormalities of gait and mobility: Secondary | ICD-10-CM | POA: Diagnosis not present

## 2020-07-08 DIAGNOSIS — M62561 Muscle wasting and atrophy, not elsewhere classified, right lower leg: Secondary | ICD-10-CM | POA: Diagnosis not present

## 2020-07-09 DIAGNOSIS — R2689 Other abnormalities of gait and mobility: Secondary | ICD-10-CM | POA: Diagnosis not present

## 2020-07-09 DIAGNOSIS — R278 Other lack of coordination: Secondary | ICD-10-CM | POA: Diagnosis not present

## 2020-07-09 DIAGNOSIS — M62561 Muscle wasting and atrophy, not elsewhere classified, right lower leg: Secondary | ICD-10-CM | POA: Diagnosis not present

## 2020-07-09 DIAGNOSIS — Z4789 Encounter for other orthopedic aftercare: Secondary | ICD-10-CM | POA: Diagnosis not present

## 2020-07-09 DIAGNOSIS — M62562 Muscle wasting and atrophy, not elsewhere classified, left lower leg: Secondary | ICD-10-CM | POA: Diagnosis not present

## 2020-07-09 DIAGNOSIS — M4726 Other spondylosis with radiculopathy, lumbar region: Secondary | ICD-10-CM | POA: Diagnosis not present

## 2020-07-09 DIAGNOSIS — M48062 Spinal stenosis, lumbar region with neurogenic claudication: Secondary | ICD-10-CM | POA: Diagnosis not present

## 2020-07-13 DIAGNOSIS — Z4789 Encounter for other orthopedic aftercare: Secondary | ICD-10-CM | POA: Diagnosis not present

## 2020-07-13 DIAGNOSIS — M4726 Other spondylosis with radiculopathy, lumbar region: Secondary | ICD-10-CM | POA: Diagnosis not present

## 2020-07-13 DIAGNOSIS — M48062 Spinal stenosis, lumbar region with neurogenic claudication: Secondary | ICD-10-CM | POA: Diagnosis not present

## 2020-07-13 DIAGNOSIS — R2689 Other abnormalities of gait and mobility: Secondary | ICD-10-CM | POA: Diagnosis not present

## 2020-07-13 DIAGNOSIS — M62561 Muscle wasting and atrophy, not elsewhere classified, right lower leg: Secondary | ICD-10-CM | POA: Diagnosis not present

## 2020-07-13 DIAGNOSIS — M6389 Disorders of muscle in diseases classified elsewhere, multiple sites: Secondary | ICD-10-CM | POA: Diagnosis not present

## 2020-07-13 DIAGNOSIS — R278 Other lack of coordination: Secondary | ICD-10-CM | POA: Diagnosis not present

## 2020-07-13 DIAGNOSIS — M62562 Muscle wasting and atrophy, not elsewhere classified, left lower leg: Secondary | ICD-10-CM | POA: Diagnosis not present

## 2020-07-14 DIAGNOSIS — M6389 Disorders of muscle in diseases classified elsewhere, multiple sites: Secondary | ICD-10-CM | POA: Diagnosis not present

## 2020-07-14 DIAGNOSIS — Z4789 Encounter for other orthopedic aftercare: Secondary | ICD-10-CM | POA: Diagnosis not present

## 2020-07-14 DIAGNOSIS — R278 Other lack of coordination: Secondary | ICD-10-CM | POA: Diagnosis not present

## 2020-07-14 DIAGNOSIS — M4726 Other spondylosis with radiculopathy, lumbar region: Secondary | ICD-10-CM | POA: Diagnosis not present

## 2020-07-14 DIAGNOSIS — M48062 Spinal stenosis, lumbar region with neurogenic claudication: Secondary | ICD-10-CM | POA: Diagnosis not present

## 2020-07-15 DIAGNOSIS — M62562 Muscle wasting and atrophy, not elsewhere classified, left lower leg: Secondary | ICD-10-CM | POA: Diagnosis not present

## 2020-07-15 DIAGNOSIS — R278 Other lack of coordination: Secondary | ICD-10-CM | POA: Diagnosis not present

## 2020-07-15 DIAGNOSIS — M4726 Other spondylosis with radiculopathy, lumbar region: Secondary | ICD-10-CM | POA: Diagnosis not present

## 2020-07-15 DIAGNOSIS — M62561 Muscle wasting and atrophy, not elsewhere classified, right lower leg: Secondary | ICD-10-CM | POA: Diagnosis not present

## 2020-07-15 DIAGNOSIS — Z4789 Encounter for other orthopedic aftercare: Secondary | ICD-10-CM | POA: Diagnosis not present

## 2020-07-15 DIAGNOSIS — R2689 Other abnormalities of gait and mobility: Secondary | ICD-10-CM | POA: Diagnosis not present

## 2020-07-15 DIAGNOSIS — M4310 Spondylolisthesis, site unspecified: Secondary | ICD-10-CM | POA: Diagnosis not present

## 2020-07-15 DIAGNOSIS — M48062 Spinal stenosis, lumbar region with neurogenic claudication: Secondary | ICD-10-CM | POA: Diagnosis not present

## 2020-07-16 DIAGNOSIS — M48062 Spinal stenosis, lumbar region with neurogenic claudication: Secondary | ICD-10-CM | POA: Diagnosis not present

## 2020-07-16 DIAGNOSIS — R278 Other lack of coordination: Secondary | ICD-10-CM | POA: Diagnosis not present

## 2020-07-16 DIAGNOSIS — M62562 Muscle wasting and atrophy, not elsewhere classified, left lower leg: Secondary | ICD-10-CM | POA: Diagnosis not present

## 2020-07-16 DIAGNOSIS — R2689 Other abnormalities of gait and mobility: Secondary | ICD-10-CM | POA: Diagnosis not present

## 2020-07-16 DIAGNOSIS — M62561 Muscle wasting and atrophy, not elsewhere classified, right lower leg: Secondary | ICD-10-CM | POA: Diagnosis not present

## 2020-07-16 DIAGNOSIS — Z4789 Encounter for other orthopedic aftercare: Secondary | ICD-10-CM | POA: Diagnosis not present

## 2020-07-16 DIAGNOSIS — M4726 Other spondylosis with radiculopathy, lumbar region: Secondary | ICD-10-CM | POA: Diagnosis not present

## 2020-07-16 DIAGNOSIS — M6389 Disorders of muscle in diseases classified elsewhere, multiple sites: Secondary | ICD-10-CM | POA: Diagnosis not present

## 2020-07-20 DIAGNOSIS — M48062 Spinal stenosis, lumbar region with neurogenic claudication: Secondary | ICD-10-CM | POA: Diagnosis not present

## 2020-07-20 DIAGNOSIS — R2689 Other abnormalities of gait and mobility: Secondary | ICD-10-CM | POA: Diagnosis not present

## 2020-07-20 DIAGNOSIS — R278 Other lack of coordination: Secondary | ICD-10-CM | POA: Diagnosis not present

## 2020-07-20 DIAGNOSIS — M62562 Muscle wasting and atrophy, not elsewhere classified, left lower leg: Secondary | ICD-10-CM | POA: Diagnosis not present

## 2020-07-20 DIAGNOSIS — Z4789 Encounter for other orthopedic aftercare: Secondary | ICD-10-CM | POA: Diagnosis not present

## 2020-07-20 DIAGNOSIS — M62561 Muscle wasting and atrophy, not elsewhere classified, right lower leg: Secondary | ICD-10-CM | POA: Diagnosis not present

## 2020-07-20 DIAGNOSIS — M4726 Other spondylosis with radiculopathy, lumbar region: Secondary | ICD-10-CM | POA: Diagnosis not present

## 2020-07-20 DIAGNOSIS — M6389 Disorders of muscle in diseases classified elsewhere, multiple sites: Secondary | ICD-10-CM | POA: Diagnosis not present

## 2020-07-21 DIAGNOSIS — M48062 Spinal stenosis, lumbar region with neurogenic claudication: Secondary | ICD-10-CM | POA: Diagnosis not present

## 2020-07-21 DIAGNOSIS — M4726 Other spondylosis with radiculopathy, lumbar region: Secondary | ICD-10-CM | POA: Diagnosis not present

## 2020-07-21 DIAGNOSIS — M6389 Disorders of muscle in diseases classified elsewhere, multiple sites: Secondary | ICD-10-CM | POA: Diagnosis not present

## 2020-07-21 DIAGNOSIS — Z4789 Encounter for other orthopedic aftercare: Secondary | ICD-10-CM | POA: Diagnosis not present

## 2020-07-21 DIAGNOSIS — R278 Other lack of coordination: Secondary | ICD-10-CM | POA: Diagnosis not present

## 2020-07-22 DIAGNOSIS — R2689 Other abnormalities of gait and mobility: Secondary | ICD-10-CM | POA: Diagnosis not present

## 2020-07-22 DIAGNOSIS — M48062 Spinal stenosis, lumbar region with neurogenic claudication: Secondary | ICD-10-CM | POA: Diagnosis not present

## 2020-07-22 DIAGNOSIS — M4726 Other spondylosis with radiculopathy, lumbar region: Secondary | ICD-10-CM | POA: Diagnosis not present

## 2020-07-22 DIAGNOSIS — M6389 Disorders of muscle in diseases classified elsewhere, multiple sites: Secondary | ICD-10-CM | POA: Diagnosis not present

## 2020-07-22 DIAGNOSIS — R278 Other lack of coordination: Secondary | ICD-10-CM | POA: Diagnosis not present

## 2020-07-22 DIAGNOSIS — M62561 Muscle wasting and atrophy, not elsewhere classified, right lower leg: Secondary | ICD-10-CM | POA: Diagnosis not present

## 2020-07-22 DIAGNOSIS — M62562 Muscle wasting and atrophy, not elsewhere classified, left lower leg: Secondary | ICD-10-CM | POA: Diagnosis not present

## 2020-07-22 DIAGNOSIS — Z4789 Encounter for other orthopedic aftercare: Secondary | ICD-10-CM | POA: Diagnosis not present

## 2020-07-27 DIAGNOSIS — M4726 Other spondylosis with radiculopathy, lumbar region: Secondary | ICD-10-CM | POA: Diagnosis not present

## 2020-07-27 DIAGNOSIS — Z4789 Encounter for other orthopedic aftercare: Secondary | ICD-10-CM | POA: Diagnosis not present

## 2020-07-27 DIAGNOSIS — M6389 Disorders of muscle in diseases classified elsewhere, multiple sites: Secondary | ICD-10-CM | POA: Diagnosis not present

## 2020-07-27 DIAGNOSIS — M48062 Spinal stenosis, lumbar region with neurogenic claudication: Secondary | ICD-10-CM | POA: Diagnosis not present

## 2020-07-27 DIAGNOSIS — R278 Other lack of coordination: Secondary | ICD-10-CM | POA: Diagnosis not present

## 2020-08-03 DIAGNOSIS — M6389 Disorders of muscle in diseases classified elsewhere, multiple sites: Secondary | ICD-10-CM | POA: Diagnosis not present

## 2020-08-03 DIAGNOSIS — M4726 Other spondylosis with radiculopathy, lumbar region: Secondary | ICD-10-CM | POA: Diagnosis not present

## 2020-08-03 DIAGNOSIS — Z4789 Encounter for other orthopedic aftercare: Secondary | ICD-10-CM | POA: Diagnosis not present

## 2020-08-03 DIAGNOSIS — M48062 Spinal stenosis, lumbar region with neurogenic claudication: Secondary | ICD-10-CM | POA: Diagnosis not present

## 2020-08-03 DIAGNOSIS — R278 Other lack of coordination: Secondary | ICD-10-CM | POA: Diagnosis not present

## 2020-08-07 ENCOUNTER — Non-Acute Institutional Stay (SKILLED_NURSING_FACILITY): Payer: PPO | Admitting: Adult Health

## 2020-08-07 DIAGNOSIS — N1832 Chronic kidney disease, stage 3b: Secondary | ICD-10-CM | POA: Diagnosis not present

## 2020-08-07 DIAGNOSIS — I10 Essential (primary) hypertension: Secondary | ICD-10-CM

## 2020-08-07 DIAGNOSIS — E893 Postprocedural hypopituitarism: Secondary | ICD-10-CM

## 2020-08-07 DIAGNOSIS — N401 Enlarged prostate with lower urinary tract symptoms: Secondary | ICD-10-CM | POA: Diagnosis not present

## 2020-08-07 DIAGNOSIS — H1032 Unspecified acute conjunctivitis, left eye: Secondary | ICD-10-CM

## 2020-08-07 DIAGNOSIS — E1142 Type 2 diabetes mellitus with diabetic polyneuropathy: Secondary | ICD-10-CM | POA: Diagnosis not present

## 2020-08-07 DIAGNOSIS — E2740 Unspecified adrenocortical insufficiency: Secondary | ICD-10-CM

## 2020-08-07 DIAGNOSIS — E039 Hypothyroidism, unspecified: Secondary | ICD-10-CM | POA: Diagnosis not present

## 2020-08-07 DIAGNOSIS — I872 Venous insufficiency (chronic) (peripheral): Secondary | ICD-10-CM | POA: Diagnosis not present

## 2020-08-07 DIAGNOSIS — R351 Nocturia: Secondary | ICD-10-CM | POA: Diagnosis not present

## 2020-08-10 ENCOUNTER — Encounter: Payer: Self-pay | Admitting: Adult Health

## 2020-08-10 NOTE — Progress Notes (Signed)
Location:  Occupational psychologist of Service:  SNF (31) Provider:   Cindi Carbon, ANP [place] [phone]   [name], DO  Patient Care Team: [name], DO as PCP - General (Geriatric Medicine) [name], [name], MD as Referring Physician (Endocrinology) [name], MD as Consulting Physician (Orthopedic Surgery) Bond, Tracie Harrier, MD as Referring Physician (Ophthalmology) Magnus Sinning, MD as Consulting Physician (Physical Medicine and Rehabilitation)  Extended Emergency Contact Information Primary Emergency Contact: Hedgepath,[name] Address: [address]          [place], [place] [address] [name] of [place] Phone: [phone] Mobile Phone: [phone] Relation: Son Secondary Emergency Contact: [name],  [address] [name] of [place] Phone: [phone] Mobile Phone: [phone] Relation: Spouse  Code Status:  DNR  Goals of care: Advanced Directive information Advanced Directives 04/28/2020  Does Patient Have a Medical Advance Directive? Yes  Type of Advance Directive Living will;Out of facility DNR (pink MOST or yellow form)  Does patient want to make changes to medical advance directive? No - Guardian declined  Copy of [place] in Chart? -  Would patient like information on creating a medical advance directive? -  Pre-existing out of facility DNR order (yellow form or pink MOST form) Pink MOST/Yellow Form most recent copy in chart - Physician notified to receive inpatient order     Chief Complaint  Patient presents with  . Medical Management of Chronic Issues    HPI:  Pt is a 86 y.o. male seen today for medical management of chronic diseases.    Mr. Fortenberry resides in skilled care s/p lumbar fusion in August of 2021. He is now pain free and is able to walk short distances with assistance but spends most of  the day in his chair. He denies any issues with urinating or having bowel movements.   He was seen for venous insuff 11/8 and recommended to wear double layer wraps. He did this and now his edema has resolved and he is back in compression hose.   Hypopituitarism s/o adenoma removal (followed by Dr. Elyse Hsu)  CKD III (hx of right nephrectomy due to renal cell ca)  Lab Results  Component Value Date   BUN 20 05/08/2020   Lab Results  Component Value Date   CREATININE 1.1 05/08/2020   Reports of redness, drainage and irritation to the left eye requesting erythromycin  BP controlled     Past Medical History:  Diagnosis Date  . Allergic rhinitis    Prior allergy shots 20 years  . Altered mental status    when pain is unmanaged or while on pain meds   . Anemia   . Anemia, B12 deficiency 2000  . Arthritis    Status post left total replacement 8 2011  . BPH (benign prostatic hyperplasia) 2013  . Cancer ([place])    renal cell ca and skin cancer   . CKD (chronic kidney disease)    stage 3  . Colitis 2014  . Diabetes mellitus without complication ([place])    type 2 - no meds  . Diabetic polyneuropathy associated with type 2 diabetes mellitus ([place])   . Difficult intubation 1994   surgery had to be  stopped due to injury to "throat"  . Difficult intubation 1994   no trouble since.  Required nasotracheal intubation  '[facility]; awake intubation 12/15/11  .  GERD (gastroesophageal reflux disease)   . Glaucoma 2007   Status post left trabeculectomy 2007  . History of colon polyps    Colonoscopy 2001  . History of kidney stones   . History of renal cell carcinoma 1994   Status post right nephrectomy  . Hyperlipidemia 2003  . Hypertension   . 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 Hypothyroidism (acquired) 2000  . Nocturia   . Osteomyelitis ([place])    right great  . Osteomyelitis ([place])    RIGHT FOOT /TOE  . Peripheral vascular disease  ([place])   . Pituitary mass ([place]) 2000   S/p transphenoidal excision 05/1999 ([facility])  . Vitamin D deficiency 2009   Past Surgical History:  Procedure Laterality Date  . AMPUTATION Right 12/20/2017   Procedure: RIGHT 1ST RAY AMPUTATION;  Surgeon: [name], MD;  Location: [facility];  Service: Orthopedics;  Laterality: Right;  . AMPUTATION Right 03/23/2018   Procedure: RIGHT 2ND TOE AMPUTATION;  Surgeon: [name], MD;  Location: [facility];  Service: Orthopedics;  Laterality: Right;  . BRAIN SURGERY  2000   pituatary gland removed .  Marland Kitchen CHOLECYSTECTOMY  1984  . Ectropion surgery Bilateral 2006  . EYE SURGERY  over last 6 yrs.     trabeculectomy...   . EYE SURGERY   cat ext ou  . JOINT REPLACEMENT  2011   knee left  . KNEE ARTHROSCOPY Right 02/06/2015   Procedure: RIGHT ARTHROSCOPY KNEE WITH LATERAL MENSICAL  DEBRIDEMENT;  Surgeon: Gaynelle Arabian, MD;  Location: [facility];  Service: Orthopedics;  Laterality: Right;  . Mohs procedure      for skin cancer on nose   . NEPHRECTOMY Right 1994   Renal cell  . REVERSE SHOULDER ARTHROPLASTY  12/15/2011   Procedure: REVERSE SHOULDER ARTHROPLASTY;  Surgeon: Marin Shutter, MD;  Location: [facility];  Service: Orthopedics;  Laterality: Right;  right total reverse shoulder  . TONSILLECTOMY    . TOTAL KNEE ARTHROPLASTY Right 06/29/2015   Procedure: TOTAL KNEE ARTHROPLASTY;  Surgeon: Gaynelle Arabian, MD;  Location: [facility];  Service: Orthopedics;  Laterality: Right;  . Transsphenoidal excision pituitary tumor  05/1999   A.Tommi Rumps, M.D.([facility])    Allergies  Allergen Reactions  . Adhesive [Tape] Other (See Comments)    CAUSES SKIN TEARS, prefers paper tape   . Lyrica [Pregabalin] Swelling  . Percocet [Oxycodone-Acetaminophen] Other (See Comments)    UNSPECIFIED REACTION  Does not want to take  . Robaxin [Methocarbamol]     UNSPECIFIED REACTION   . Diovan [Valsartan] Rash    Outpatient Encounter Medications as of 08/07/2020  Medication Sig  .  Diclofenac Sodium 1.5 % SOLN Dispense 10 to 40 drops to affected area on skin as directed, twice per day  . acetaminophen (TYLENOL) 500 MG tablet Take 1,000 mg by mouth 3 (three) times daily as needed for moderate pain.   . Cholecalciferol (VITAMIN D) 2000 units tablet Take 2,000 Units by mouth daily.  . Coenzyme Q10 300 MG CAPS Take 300 mg by mouth daily.   Marland Kitchen erythromycin ophthalmic ointment 1 application 4 (four) times daily.  . finasteride (PROSCAR) 5 MG tablet Take 5 mg by mouth daily.   . Flaxseed, Linseed, (FLAXSEED OIL) 1000 MG CAPS Take 1,000 mg by mouth daily.  . fluticasone (FLONASE) 50 MCG/ACT nasal spray Place 2 sprays into both nostrils daily.  . mirabegron ER (MYRBETRIQ) 25 MG TB24 tablet Take 25 mg by mouth  every other day.  . Multiple Vitamins-Minerals (MULTIVITAMIN & MINERAL PO) Take 1 tablet by mouth daily.  Vladimir Faster Glycol-Propyl Glycol (SYSTANE) 0.4-0.3 % SOLN Apply 1 drop to eye daily. Left eye  . polyethylene glycol (MIRALAX / GLYCOLAX) 17 g packet Take 17 g by mouth daily.  . pravastatin (PRAVACHOL) 80 MG tablet Take 80 mg by mouth every other day.   . predniSONE (DELTASONE) 5 MG tablet Take 2.5-5 mg by mouth See admin instructions. Take 5 mg by mouth in the morning and 2.5 mg in the evening  . SYNTHROID 88 MCG tablet Take 88 mcg by mouth daily before breakfast.   . testosterone cypionate (DEPOTESTOSTERONE CYPIONATE) 200 MG/ML injection Inject 30 mg into the muscle every Friday.   . timolol (TIMOPTIC) 0.5 % ophthalmic solution Place 1 drop into the right eye daily.  . traMADol (ULTRAM) 50 MG tablet Take 0.5-1 tablets (25-50 mg total) by mouth every 6 (six) hours as needed. 25 for mild to moderate pain, 50 for severe pain  . vitamin B-12 (CYANOCOBALAMIN) 500 MCG tablet Take 500 mcg by mouth daily.   No facility-administered encounter medications on file as of 08/07/2020.    Review of Systems  Constitutional: Negative for activity change, appetite change, chills,  diaphoresis, fatigue, fever and unexpected weight change.  HENT: Negative for congestion.   Eyes: Positive for discharge and redness. Negative for photophobia, pain, itching and visual disturbance.  Respiratory: Negative for cough, shortness of breath, wheezing and stridor.   Cardiovascular: Positive for leg swelling (improved). Negative for chest pain and palpitations.  Gastrointestinal: Negative for abdominal distention, abdominal pain, constipation and diarrhea.  Genitourinary: Negative for difficulty urinating and dysuria.  Musculoskeletal: Positive for gait problem. Negative for arthralgias, back pain, joint swelling and myalgias.  Neurological: Negative for dizziness, seizures, syncope, facial asymmetry, speech difficulty, weakness and headaches.  Hematological: Negative for adenopathy. Does not bruise/bleed easily.  Psychiatric/Behavioral: Negative for agitation, behavioral problems and confusion.    Immunization History  Administered Date(s) Administered  . Fluad Quad(high Dose 65+) 04/30/2019  . Influenza, High Dose Seasonal PF 04/30/2017, 06/08/2018  . Influenza, Quadrivalent, Recombinant, Inj, Pf 05/19/2016  . Influenza-Unspecified 06/23/2020  . Moderna Sars-Covid-2 Vaccination 09/10/2019, 10/08/2019, 07/15/2020  . Pneumococcal Conjugate-13 06/04/2015  . Pneumococcal Polysaccharide-23 06/29/2010  . Tdap 10/28/1998, 06/08/2018  . Zoster 10/27/2005  . Zoster Recombinat (Shingrix) 02/04/2018, 07/02/2018   Pertinent  Health Maintenance Due  Topic Date Due  . HEMOGLOBIN A1C  09/20/2018  . OPHTHALMOLOGY EXAM  06/04/2020  . URINE MICROALBUMIN  09/10/2020 (Originally 01/22/1933)  . FOOT EXAM  02/09/2021  . INFLUENZA VACCINE  Completed  . PNA vac Low Risk Adult  Completed   Fall Risk  07/24/2019 07/17/2019 07/05/2019 03/13/2019 11/14/2018  Falls in the past year? 0 0 0 0 1  Comment Emmi Telephone Survey: data to providers prior to load - - - -  Number falls in past yr: - 0 0 0 0   Comment - - - - -  Injury with Fall? - 0 0 0 1  Risk Factor Category  - - - - -  Risk for fall due to : - - - - -   Functional Status Survey:    Vitals:   08/10/20 1554  Weight: 175 lb 3.2 oz (79.5 kg)   Body mass index is 24.44 kg/m. Physical Exam Vitals and nursing note reviewed.  Constitutional:      General: He is not in acute distress.    Appearance: He is  not diaphoretic.  HENT:     Head: Normocephalic and atraumatic.  Eyes:     General:        Right eye: No discharge.        Left eye: Discharge present.    Comments: Left eye with conjunctival erythema. Right eye without erythema No ciliary flush to either eye  Neck:     Thyroid: No thyromegaly.     Vascular: No JVD.     Trachea: No tracheal deviation.  Cardiovascular:     Rate and Rhythm: Normal rate and regular rhythm.     Heart sounds: No murmur heard.   Pulmonary:     Effort: Pulmonary effort is normal. No respiratory distress.     Breath sounds: Normal breath sounds. No wheezing.  Abdominal:     General: Bowel sounds are normal. There is no distension.     Palpations: Abdomen is soft.     Tenderness: There is no abdominal tenderness.  Musculoskeletal:     Cervical back: Neck supple.     Right lower leg: Edema (+1) present.     Left lower leg: Edema (+1) present.  Lymphadenopathy:     Cervical: No cervical adenopathy.  Skin:    General: Skin is warm and dry.  Neurological:     Mental Status: He is alert and oriented to person, place, and time.     Cranial Nerves: No cranial nerve deficit.  Psychiatric:        Mood and Affect: Mood and affect normal.     Labs reviewed: Recent Labs    08/29/19 0000 02/11/20 0000 05/04/20 0000 05/08/20 0000  NA 139 136* 128*  128* 137  K 3.9  --  4.3  4.3 4.4  CL 101  --  91*  91* 100  CO2 33* 27* 28*  28* 32*  BUN 25* 19 21  21 20   CREATININE 1.2 0.9 1.1  1.1 1.1  CALCIUM 9.4 9.3 9.0  9.0 9.7   Recent Labs    08/29/19 0000  ALBUMIN 3.6    Recent Labs    04/21/20 0628 05/04/20 0000 05/05/20 0000  WBC 13.4* 15.6  15.6 9.9  NEUTROABS 8.6* 12 6  HGB 13.4 10.7*  10.7* 10.8*  HCT 42.5 33*  33* 32*  MCV 102.2*  --   --   PLT 289 237  237 250   Lab Results  Component Value Date   TSH <0.010 (L) 03/20/2018   Lab Results  Component Value Date   HGBA1C 6.5 (H) 03/20/2018   Lab Results  Component Value Date   CHOL 138 08/29/2019   HDL 2 (A) 08/29/2019   LDLCALC 61 08/29/2019   TRIG 72 08/29/2019    Significant Diagnostic Results in last 30 days:  No results found.  Assessment/Plan 1. 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 Elyse Hsu   2. Hypothyroidism (acquired) Continue Synthroid 88 mcg qd (needs f/u)  3. Adrenal insufficiency ([facility]) Continue prednisone as ordered   4. Diabetic polyneuropathy associated with type 2 diabetes mellitus ([place]) Diet controlled Needs A1C   5. Primary hypertension Controlled  6. Stage 3b chronic kidney disease ([place]) Continue to periodically monitor BMP and avoid nephrotoxic agents  7. Benign prostatic hyperplasia with nocturia Continue Proscar 5 mg qd  8. Acute bacterial conjunctivitis of left eye Erythromycin ointment QID left eye x 7 days   9. Venous insufficiency D/C wraps Continue compression hose and elevation   Family/ staff Communication: discussed with Mr. Gros  Labs/tests ordered:  Wants them done at Dr. Darryl Nestle. Recommended that his nurse schedule a f/u with his office and we could draw the blood here if necessary

## 2020-08-18 ENCOUNTER — Non-Acute Institutional Stay (SKILLED_NURSING_FACILITY): Payer: PPO | Admitting: Internal Medicine

## 2020-08-18 DIAGNOSIS — L97521 Non-pressure chronic ulcer of other part of left foot limited to breakdown of skin: Secondary | ICD-10-CM

## 2020-08-18 DIAGNOSIS — L03032 Cellulitis of left toe: Secondary | ICD-10-CM

## 2020-08-18 DIAGNOSIS — E11621 Type 2 diabetes mellitus with foot ulcer: Secondary | ICD-10-CM

## 2020-08-18 DIAGNOSIS — L97511 Non-pressure chronic ulcer of other part of right foot limited to breakdown of skin: Secondary | ICD-10-CM | POA: Diagnosis not present

## 2020-08-18 DIAGNOSIS — E08621 Diabetes mellitus due to underlying condition with foot ulcer: Secondary | ICD-10-CM

## 2020-08-18 DIAGNOSIS — L03031 Cellulitis of right toe: Secondary | ICD-10-CM | POA: Diagnosis not present

## 2020-08-18 NOTE — Progress Notes (Signed)
Location:  Occupational psychologist of Service:  SNF (31) Provider:  Jailynne Opperman L. Mariea Clonts, D.O., C.M.D.  [name], DO  Patient Care Team: [name], DO as PCP - General (Geriatric Medicine) [name], [name], MD as Referring Physician (Endocrinology) [name], MD as Consulting Physician (Orthopedic Surgery) Bond, Tracie Harrier, MD as Referring Physician (Ophthalmology) Magnus Sinning, MD as Consulting Physician (Physical Medicine and Rehabilitation)  Extended Emergency Contact Information Primary Emergency Contact: Ragan,[name] Address: [address]          [place], [place] [address] [name] of [place] Phone: [phone] Mobile Phone: [phone] Relation: Son Secondary Emergency Contact: [name], [place] [address] [name] of [place] Phone: [phone] Mobile Phone: [phone] Relation: Spouse  Code Status:  DNR Goals of care: Advanced Directive information Advanced Directives 04/28/2020  Does Patient Have a Medical Advance Directive? Yes  Type of Advance Directive Living will;Out of facility DNR (pink MOST or yellow form)  Does patient want to make changes to medical advance directive? No - Guardian declined  Copy of [place] in Chart? -  Would patient like information on creating a medical advance directive? -  Pre-existing out of facility DNR order (yellow form or pink MOST form) Pink MOST/Yellow Form most recent copy in chart - Physician notified to receive inpatient order   Chief Complaint  Patient presents with  . Acute Visit    Toe wound infection, h/o prior amputation    HPI:  Pt is a 86 y.o. male seen today for an acute visit for acute visit due to infection of toe.  He has a h/o PAD, polyneuropathy due to DMII, chronic steroid use due to adrenal insufficiency and panhypopituitarism.    He has been seen by the wound care nurse  here who dressed his wound and determined it appeared infected.  He was ready for dinner and wanted no part of me undressing the wound and looking at it myself, just wanted antibiotics.    Past Medical History:  Diagnosis Date  . Allergic rhinitis    Prior allergy shots 20 years  . Altered mental status    when pain is unmanaged or while on pain meds   . Anemia   . Anemia, B12 deficiency 2000  . Arthritis    Status post left total replacement 8 2011  . BPH (benign prostatic hyperplasia) 2013  . Cancer ([place])    renal cell ca and skin cancer   . CKD (chronic kidney disease)    stage 3  . Colitis 2014  . Diabetes mellitus without complication ([place])    type 2 - no meds  . Diabetic polyneuropathy associated with type 2 diabetes mellitus ([place])   . Difficult intubation 1994   surgery had to be  stopped due to injury to "throat"  . Difficult intubation 1994   no trouble since.  Required nasotracheal intubation  '02 [facility]; awake intubation 12/15/11  . GERD (gastroesophageal reflux disease)   . Glaucoma 2007   Status post left trabeculectomy 2007  . History of colon polyps    Colonoscopy 2001  . History of kidney stones   . History of renal cell carcinoma 1994   Status post right nephrectomy  . Hyperlipidemia 2003  . Hypertension   . 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 Hypothyroidism (acquired) 2000  . Nocturia   .  Osteomyelitis ([place])    right great  . Osteomyelitis ([place])    RIGHT FOOT /TOE  . Peripheral vascular disease ([place])   . Pituitary mass ([place]) 2000   S/p transphenoidal excision 05/1999 ([facility])  . Vitamin D deficiency 2009   Past Surgical History:  Procedure Laterality Date  . AMPUTATION Right 12/20/2017   Procedure: RIGHT 1ST RAY AMPUTATION;  Surgeon: [name], MD;  Location: [facility];  Service: Orthopedics;  Laterality: Right;  . AMPUTATION Right 03/23/2018   Procedure: RIGHT 2ND TOE AMPUTATION;  Surgeon:  [name], MD;  Location: [facility];  Service: Orthopedics;  Laterality: Right;  . BRAIN SURGERY  2000   pituatary gland removed .  Marland Kitchen CHOLECYSTECTOMY  1984  . Ectropion surgery Bilateral 2006  . EYE SURGERY  over last 6 yrs.     trabeculectomy...   . EYE SURGERY   cat ext ou  . JOINT REPLACEMENT  2011   knee left  . KNEE ARTHROSCOPY Right 02/06/2015   Procedure: RIGHT ARTHROSCOPY KNEE WITH LATERAL MENSICAL  DEBRIDEMENT;  Surgeon: Gaynelle Arabian, MD;  Location: [facility];  Service: Orthopedics;  Laterality: Right;  . Mohs procedure      for skin cancer on nose   . NEPHRECTOMY Right 1994   Renal cell  . REVERSE SHOULDER ARTHROPLASTY  12/15/2011   Procedure: REVERSE SHOULDER ARTHROPLASTY;  Surgeon: Marin Shutter, MD;  Location: [facility];  Service: Orthopedics;  Laterality: Right;  right total reverse shoulder  . TONSILLECTOMY    . TOTAL KNEE ARTHROPLASTY Right 06/29/2015   Procedure: TOTAL KNEE ARTHROPLASTY;  Surgeon: Gaynelle Arabian, MD;  [facility];  Service: Orthopedics;  Laterality: Right;  . Transsphenoidal excision pituitary tumor  05/1999   A.Tommi Rumps, M.D.([facility])    Allergies  Allergen Reactions  . Adhesive [Tape] Other (See Comments)    CAUSES SKIN TEARS, prefers paper tape   . Lyrica [Pregabalin] Swelling  . Percocet [Oxycodone-Acetaminophen] Other (See Comments)    UNSPECIFIED REACTION  Does not want to take  . Robaxin [Methocarbamol]     UNSPECIFIED REACTION   . Diovan [Valsartan] Rash    Outpatient Encounter Medications as of 08/18/2020  Medication Sig  . acetaminophen (TYLENOL) 500 MG tablet Take 1,000 mg by mouth 3 (three) times daily as needed for moderate pain.   . Cholecalciferol (VITAMIN D) 2000 units tablet Take 2,000 Units by mouth daily.  . Coenzyme Q10 300 MG CAPS Take 300 mg by mouth daily.   . Diclofenac Sodium 1.5 % SOLN Dispense 10 to 40 drops to affected area on skin as directed, twice per day  . finasteride (PROSCAR) 5 MG tablet Take 5 mg by mouth  daily.   . Flaxseed, Linseed, (FLAXSEED OIL) 1000 MG CAPS Take 1,000 mg by mouth daily.  . fluticasone (FLONASE) 50 MCG/ACT nasal spray Place 2 sprays into both nostrils daily.  . mirabegron ER (MYRBETRIQ) 25 MG TB24 tablet Take 25 mg by mouth every other day.  . Multiple Vitamins-Minerals (MULTIVITAMIN & MINERAL PO) Take 1 tablet by mouth daily.  Vladimir Faster Glycol-Propyl Glycol (SYSTANE) 0.4-0.3 % SOLN Apply 1 drop to eye daily. Left eye  . polyethylene glycol (MIRALAX / GLYCOLAX) 17 g packet Take 17 g by mouth daily.  . pravastatin (PRAVACHOL) 80 MG tablet Take 80 mg by mouth every other day.   . predniSONE (DELTASONE) 5 MG tablet Take 2.5-5 mg by mouth See admin instructions. Take 5 mg by mouth in the morning and 2.5 mg  in the evening  . SYNTHROID 88 MCG tablet Take 88 mcg by mouth daily before breakfast.   . testosterone cypionate (DEPOTESTOSTERONE CYPIONATE) 200 MG/ML injection Inject 30 mg into the muscle every Friday.   . timolol (TIMOPTIC) 0.5 % ophthalmic solution Place 1 drop into the right eye daily.  . traMADol (ULTRAM) 50 MG tablet Take 0.5-1 tablets (25-50 mg total) by mouth every 6 (six) hours as needed. 25 for mild to moderate pain, 50 for severe pain  . vitamin B-12 (CYANOCOBALAMIN) 500 MCG tablet Take 500 mcg by mouth daily.   No facility-administered encounter medications on file as of 08/18/2020.    Review of Systems  Constitutional: Negative for chills and fever.  Cardiovascular: Negative for chest pain and palpitations.  Neurological: Positive for tingling and sensory change.    Immunization History  Administered Date(s) Administered  . Fluad Quad(high Dose 65+) 04/30/2019  . Influenza, High Dose Seasonal PF 04/30/2017, 06/08/2018, 06/19/2020  . Influenza, Quadrivalent, Recombinant, Inj, Pf 05/19/2016  . Moderna Sars-Covid-2 Vaccination 09/10/2019, 10/08/2019, 07/15/2020  . Pneumococcal Conjugate-13 06/04/2015  . Pneumococcal Polysaccharide-23 06/29/2010  . Tdap  10/28/1998, 06/08/2018  . Zoster 10/27/2005  . Zoster Recombinat (Shingrix) 02/04/2018, 07/02/2018   Pertinent  Health Maintenance Due  Topic Date Due  . HEMOGLOBIN A1C  09/20/2018  . OPHTHALMOLOGY EXAM  06/04/2020  . URINE MICROALBUMIN  09/10/2020 (Originally 01/22/1933)  . FOOT EXAM  02/09/2021  . INFLUENZA VACCINE  Completed  . PNA vac Low Risk Adult  Completed   Fall Risk  07/24/2019 07/17/2019 07/05/2019 03/13/2019 11/14/2018  Falls in the past year? 0 0 0 0 1  Comment Emmi Telephone Survey: data to providers prior to load - - - -  Number falls in past yr: - 0 0 0 0  Comment - - - - -  Injury with Fall? - 0 0 0 1  Risk Factor Category  - - - - -  Risk for fall due to : - - - - -   Functional Status Survey:    Vitals:   08/18/20 0742  BP: (!) 158/78  Pulse: 82  Resp: 20  Temp: 97.9 F (36.6 C)  SpO2: 93%   There is no height or weight on file to calculate BMI. Physical Exam Vitals and nursing note reviewed.  Constitutional:      Appearance: Normal appearance.  Eyes:     Comments: glasses  Pulmonary:     Effort: Pulmonary effort is normal.  Musculoskeletal:     Comments: Sitting up in recliner dressed and ready for evening meal  Skin:    Comments: Declined examination of toe as dressing just changed, per wound care nurse:  Right foot top wound 1cm x 1.4cm and bottom wound 1.1 cm x 1.2cm; two areas of firmly adherent slough to the left and right of each wound that measure 0.2x0.3cm and wound bed was 100% tightly adherent white slough, 3rd toe swollen and red, moderate tan colored drainage on dressing prior to removal on left great toe:  Total circumference of that wound was 1.2cm x 2.2cm with an area of white slough centrally 0.8x1.5cm.  0.3cm circumferential granulation tissue around it.  0.3x0.4cm abraded area on tip of left great toe which is also red and swollen with 0.5x0.3cm area tightly adherent slough  Neurological:     Mental Status: He is alert. Mental status  is at baseline.  Psychiatric:        Mood and Affect: Mood normal.  Labs reviewed: Recent Labs    08/29/19 0000 02/11/20 0000 05/04/20 0000 05/08/20 0000  NA 139 136* 128*  128* 137  K 3.9  --  4.3  4.3 4.4  CL 101  --  91*  91* 100  CO2 33* 27* 28*  28* 32*  BUN 25* 19 21  21 20   CREATININE 1.2 0.9 1.1  1.1 1.1  CALCIUM 9.4 9.3 9.0  9.0 9.7   Recent Labs    08/29/19 0000  ALBUMIN 3.6   Recent Labs    04/21/20 0628 05/04/20 0000 05/05/20 0000  WBC 13.4* 15.6  15.6 9.9  NEUTROABS 8.6* 12 6  HGB 13.4 10.7*  10.7* 10.8*  HCT 42.5 33*  33* 32*  MCV 102.2*  --   --   PLT 289 237  237 250   Lab Results  Component Value Date   TSH <0.010 (L) 03/20/2018   Lab Results  Component Value Date   HGBA1C 6.5 (H) 03/20/2018   Lab Results  Component Value Date   CHOL 138 08/29/2019   HDL 2 (A) 08/29/2019   LDLCALC 61 08/29/2019   TRIG 72 08/29/2019    Significant Diagnostic Results in last 30 days:  No results found.  Assessment/Plan 1. Diabetic ulcer of toe of right foot associated with diabetes mellitus due to underlying condition, limited to breakdown of skin (HCC) -wound cleanser, skin prep, santyl daily  -start abx -leave shoes off in recliner -only to wear tan boat shoes with white soles with wider toe box -elevate feet in recliner==not to stay in wheelchair -cont compression stockings   2. Diabetic ulcer of toe of left foot associated with type 2 diabetes mellitus, limited to breakdown of skin (HCC) -wound cleanser, skin prep, santyl daily  -start abx -leave shoes off in recliner -only to wear tan boat shoes with white soles with wider toe box -elevate feet in recliner==not to stay in wheelchair -cont compression stockings  3. Cellulitis of great toe, left -start doxycycline 100mg  po bid for 10 days  4. Cellulitis of right toe (3rd) -start doxycycline 100mg  po bid for 10 days  NP or DO should see wound during dressing changes to  permit photos for charting and monitoring  Family/ staff Communication: d/w pt and nursing  Labs/tests ordered:  No new today  Rashun Grattan L. Keshara Kiger, D.O. [facility] [address], [place] [address] Cell Phone (Mon-Fri 8am-5pm):  [phone] On Call:  [phone] & follow prompts after 5pm & weekends Office Phone:  [phone] Office Fax:  [phone]

## 2020-08-27 ENCOUNTER — Non-Acute Institutional Stay (SKILLED_NURSING_FACILITY): Payer: PPO | Admitting: Adult Health

## 2020-08-27 ENCOUNTER — Encounter: Payer: Self-pay | Admitting: Internal Medicine

## 2020-08-27 ENCOUNTER — Encounter: Payer: Self-pay | Admitting: Adult Health

## 2020-08-27 DIAGNOSIS — M79671 Pain in right foot: Secondary | ICD-10-CM | POA: Diagnosis not present

## 2020-08-27 DIAGNOSIS — A419 Sepsis, unspecified organism: Secondary | ICD-10-CM | POA: Diagnosis not present

## 2020-08-27 DIAGNOSIS — M7989 Other specified soft tissue disorders: Secondary | ICD-10-CM | POA: Diagnosis not present

## 2020-08-27 DIAGNOSIS — J181 Lobar pneumonia, unspecified organism: Secondary | ICD-10-CM | POA: Diagnosis not present

## 2020-08-27 DIAGNOSIS — L0390 Cellulitis, unspecified: Secondary | ICD-10-CM

## 2020-08-27 DIAGNOSIS — D72829 Elevated white blood cell count, unspecified: Secondary | ICD-10-CM

## 2020-08-27 DIAGNOSIS — R0902 Hypoxemia: Secondary | ICD-10-CM | POA: Diagnosis not present

## 2020-08-27 DIAGNOSIS — E860 Dehydration: Secondary | ICD-10-CM

## 2020-08-27 DIAGNOSIS — R410 Disorientation, unspecified: Secondary | ICD-10-CM

## 2020-08-27 DIAGNOSIS — L03115 Cellulitis of right lower limb: Secondary | ICD-10-CM | POA: Diagnosis not present

## 2020-08-27 LAB — CBC AND DIFFERENTIAL
HCT: 33 — AB (ref 41–53)
Hemoglobin: 10.9 — AB (ref 13.5–17.5)
Platelets: 239 (ref 150–399)
WBC: 25.6

## 2020-08-27 LAB — BASIC METABOLIC PANEL
BUN: 27 — AB (ref 4–21)
CO2: 27 — AB (ref 13–22)
Chloride: 97 — AB (ref 99–108)
Creatinine: 1.1 (ref 0.6–1.3)
Glucose: 116
Potassium: 4.2 (ref 3.4–5.3)
Sodium: 133 — AB (ref 137–147)

## 2020-08-27 LAB — COMPREHENSIVE METABOLIC PANEL
Albumin: 2.9 — AB (ref 3.5–5.0)
Calcium: 9 (ref 8.7–10.7)
Globulin: 1.9

## 2020-08-27 LAB — CBC: RBC: 3.56 — AB (ref 3.87–5.11)

## 2020-08-27 NOTE — Progress Notes (Signed)
Location:  Medical illustrator of Service:  SNF (31) Provider:   Peggye Ley, ANP [facility] [phone]   Kermit Balo, DO  Patient Care Team: Kermit Balo, DO as PCP - General (Geriatric Medicine) [name], Casimiro Needle, MD as Referring Physician (Endocrinology) Nadara Mustard, MD as Consulting Physician (Orthopedic Surgery) Bond, Doran Stabler, MD as Referring Physician (Ophthalmology) Tyrell Antonio, MD as Consulting Physician (Physical Medicine and Rehabilitation)  Extended Emergency Contact Information Primary Emergency Contact: Emanuele,[name] Address: [address]          [place], [place] [address] Darden Amber of [place] Home Phone: [phone] Mobile Phone: [phone] Relation: Son Secondary Emergency Contact: Evlyn Kanner, [place] [address] Darden Amber of [place] Home Phone: [phone] Mobile Phone: [phone] Relation: Spouse  Code Status:  DNR Goals of care: Advanced Directive information Advanced Directives 04/28/2020  Does Patient Have a Medical Advance Directive? Yes  Type of Advance Directive Living will;Out of facility DNR (pink MOST or yellow form)  Does patient want to make changes to medical advance directive? No - Guardian declined  Copy of Healthcare Power of Attorney in Chart? -  Would patient like information on creating a medical advance directive? -  Pre-existing out of facility DNR order (yellow form or pink MOST form) Pink MOST/Yellow Form most recent copy in chart - Physician notified to receive inpatient order     Chief Complaint  Patient presents with   Acute Visit    RLE edema, decreased 02 sats with lethargy and low grade temp    HPI:  Pt is a 86 y.o. male seen today for an acute visit for complaints of headache, sore throat confusion, right leg redness, and low grade temp. Symptoms started on 12/29.  Possible covid exposure reported.  Rapid test negative. Placed on isolation and PCR pending.  02 sats were in the 80s on RA, now in the 90s on 2 liters.  Currently on day 9/10 of doxycycline for right toe wound infection. Area is worsening with redness and edema. Pt is more confused and eating less.    Labs and xrays taken 08/27/2020   WBC 25.6 Hgb 10.9 Plt 239  Alb 2.9 Glucose 116 BUN 26.5 Cr 1.09 Na 133 CL 97 C02 97 ALT 14 AST 17   RLE doppler neg for DVT  CXR mild pulmonary infiltrate in the left lung base  Right foot xray no evidence of active osteomyelitis   VS 99.1AX temp  BP 104/80 RR 20 HR 80 02 sat 83% 2 liters then 02 increase 3.5 liters Sat increased 94%  Past Medical History:  Diagnosis Date   Allergic rhinitis    Prior allergy shots 20 years   Altered mental status    when pain is unmanaged or while on pain meds    Anemia    Anemia, B12 deficiency 2000   Arthritis    Status post left total replacement 8 2011   BPH (benign prostatic hyperplasia) 2013   Cancer [facility])    renal cell ca and skin cancer    CKD (chronic kidney disease)    stage 3   Colitis 2014   Diabetes mellitus without complication (HCC)    type 2 - no meds   Diabetic polyneuropathy associated with type 2 diabetes mellitus (HCC)    Difficult intubation 1994   surgery had to be  stopped due to injury to "throat"  Difficult intubation 1994   no trouble since.  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HCC) 2000   Treated with hormone replacement   Hypothyroidism (acquired) 2000   Nocturia    Osteomyelitis (HCC)    right great   Osteomyelitis (HCC)     RIGHT FOOT /TOE   Peripheral vascular disease (HCC)    Pituitary mass (HCC) 2000   S/p transphenoidal excision 05/1999 ([facility])   Vitamin D deficiency 2009   Past Surgical History:  Procedure Laterality Date   AMPUTATION Right 12/20/2017   Procedure: RIGHT 1ST RAY AMPUTATION;  Surgeon: Nadara Mustard, MD;  Location: [facility] OR;  Service: Orthopedics;  Laterality: Right;   AMPUTATION Right 03/23/2018   Procedure: RIGHT 2ND TOE AMPUTATION;  Surgeon: Nadara Mustard, MD;  Location: [facility] OR;  Service: Orthopedics;  Laterality: Right;   BRAIN SURGERY  2000   pituatary gland removed .   CHOLECYSTECTOMY  1984   Ectropion surgery Bilateral 2006   EYE SURGERY  over last 6 yrs.     trabeculectomy...    EYE SURGERY   cat ext ou   JOINT REPLACEMENT  2011   knee left   KNEE ARTHROSCOPY Right 02/06/2015   Procedure: RIGHT ARTHROSCOPY KNEE WITH LATERAL MENSICAL  DEBRIDEMENT;  Surgeon: Ollen Gross, MD;  [facility];  Service: Orthopedics;  Laterality: Right;   Mohs procedure      for skin cancer on nose    NEPHRECTOMY Right 1994   Renal cell   REVERSE SHOULDER ARTHROPLASTY  12/15/2011   Procedure: REVERSE SHOULDER ARTHROPLASTY;  Surgeon: Senaida Lange, MD;  Location: [facility];  Service: Orthopedics;  Laterality: Right;  right total reverse shoulder   TONSILLECTOMY     TOTAL KNEE ARTHROPLASTY Right 06/29/2015   Procedure: TOTAL KNEE ARTHROPLASTY;  Surgeon: Ollen Gross, MD;  [facility];  Service: Orthopedics;  Laterality: Right;   Transsphenoidal excision pituitary tumor  05/1999   A.Zachery Conch, M.D.([facility])    Allergies  Allergen Reactions   Adhesive [Tape] Other (See Comments)    CAUSES SKIN TEARS, prefers paper tape    Lyrica [Pregabalin] Swelling   Percocet [Oxycodone-Acetaminophen] Other (See Comments)    UNSPECIFIED REACTION  Does not want to take   Robaxin [Methocarbamol]     UNSPECIFIED REACTION    Diovan [Valsartan] Rash    Outpatient Encounter  Medications as of 08/27/2020  Medication Sig   acetaminophen (TYLENOL) 500 MG tablet Take 1,000 mg by mouth 3 (three) times daily as needed for moderate pain.    Cholecalciferol (VITAMIN D) 2000 units tablet Take 2,000 Units by mouth daily.   Coenzyme Q10 300 MG CAPS Take 300 mg by mouth daily.    Diclofenac Sodium 1.5 % SOLN Dispense 10 to 40 drops to affected area on skin as directed, twice per day   finasteride (PROSCAR) 5 MG tablet Take 5 mg by mouth daily.    Flaxseed, Linseed, (FLAXSEED OIL) 1000 MG CAPS Take 1,000 mg by mouth daily.   fluticasone (FLONASE) 50 MCG/ACT nasal spray Place 2 sprays into both nostrils daily.   mirabegron ER (MYRBETRIQ)  25 MG TB24 tablet Take 25 mg by mouth every other day.   Multiple Vitamins-Minerals (MULTIVITAMIN & MINERAL PO) Take 1 tablet by mouth daily.   Polyethyl Glycol-Propyl Glycol (SYSTANE) 0.4-0.3 % SOLN Apply 1 drop to eye daily. Left eye   polyethylene glycol (MIRALAX / GLYCOLAX) 17 g packet Take 17 g by mouth daily.   pravastatin (PRAVACHOL) 80 MG tablet Take 80 mg by mouth every other day.    predniSONE (DELTASONE) 5 MG tablet Take 2.5-5 mg by mouth See admin instructions. Take 5 mg by mouth in the morning and 2.5 mg in the evening   SYNTHROID 88 MCG tablet Take 88 mcg by mouth daily before breakfast.    testosterone cypionate (DEPOTESTOSTERONE CYPIONATE) 200 MG/ML injection Inject 30 mg into the muscle every Friday.    timolol (TIMOPTIC) 0.5 % ophthalmic solution Place 1 drop into the right eye daily.   traMADol (ULTRAM) 50 MG tablet Take 0.5-1 tablets (25-50 mg total) by mouth every 6 (six) hours as needed. 25 for mild to moderate pain, 50 for severe pain   vitamin B-12 (CYANOCOBALAMIN) 500 MCG tablet Take 500 mcg by mouth daily.   No facility-administered encounter medications on file as of 08/27/2020.    Review of Systems  Immunization History  Administered Date(s) Administered   Fluad Quad(high Dose 65+) 04/30/2019    Influenza, High Dose Seasonal PF 04/30/2017, 06/08/2018, 06/19/2020   Influenza, Quadrivalent, Recombinant, Inj, Pf 05/19/2016   Moderna Sars-Covid-2 Vaccination 09/10/2019, 10/08/2019, 07/15/2020   Pneumococcal Conjugate-13 06/04/2015   Pneumococcal Polysaccharide-23 06/29/2010   Tdap 10/28/1998, 06/08/2018   Zoster 10/27/2005   Zoster Recombinat (Shingrix) 02/04/2018, 07/02/2018   Pertinent  Health Maintenance Due  Topic Date Due   HEMOGLOBIN A1C  09/20/2018   OPHTHALMOLOGY EXAM  06/04/2020   URINE MICROALBUMIN  09/10/2020 (Originally 01/22/1933)   FOOT EXAM  02/09/2021   INFLUENZA VACCINE  Completed   PNA vac Low Risk Adult  Completed   Fall Risk  07/24/2019 07/17/2019 07/05/2019 03/13/2019 11/14/2018  Falls in the past year? 0 0 0 0 1  Comment Emmi Telephone Survey: data to providers prior to load - - - -  Number falls in past yr: - 0 0 0 0  Comment - - - - -  Injury with Fall? - 0 0 0 1  Risk Factor Category  - - - - -  Risk for fall due to : - - - - -   Functional Status Survey:    There were no vitals filed for this visit. There is no height or weight on file to calculate BMI. Physical Exam  Labs reviewed: Recent Labs    08/29/19 0000 02/11/20 0000 05/04/20 0000 05/08/20 0000  NA 139 136* 128*   128* 137  K 3.9  --  4.3   4.3 4.4  CL 101  --  91*   91* 100  CO2 33* 27* 28*   28* 32*  BUN 25* 19 21   21 20   CREATININE 1.2 0.9 1.1   1.1 1.1  CALCIUM 9.4 9.3 9.0   9.0 9.7   Recent Labs    08/29/19 0000  ALBUMIN 3.6   Recent Labs    04/21/20 0628 05/04/20 0000 05/05/20 0000  WBC 13.4* 15.6   15.6 9.9  NEUTROABS 8.6* 12 6  HGB 13.4 10.7*   10.7* 10.8*  HCT 42.5 33*   33* 32*  MCV 102.2*  --   --   PLT 289 237  237 250   Lab Results  Component Value Date   TSH <0.010 (L) 03/20/2018   Lab Results  Component Value Date   HGBA1C 6.5 (H) 03/20/2018   Lab Results  Component Value Date   CHOL 138 08/29/2019   HDL 2 (A) 08/29/2019    LDLCALC 61 08/29/2019   TRIG 72 08/29/2019    Significant Diagnostic Results in last 30 days:  No results found.  Assessment/Plan  1. Sepsis due to cellulitis (HCC) Significant right leg redness and swelling with negative doppler and associated wound infection with prior hx of osteomyelitis.  D/C doxycycline Establish IV access and begin Zosyn 3.375 g q 6 hrs and Vanc per pharmacy for 5 days CBC and BMP in the am Monitor vitals and report if worsening condition Pt has declined hospitalization and his wishes were reported to his son. He is a DNR.    2. Leukocytosis, unspecified type Due to #1  3. Dehydration Encourage oral intake and begin NS at 80 cc/hr  4. Acute delirium Mild, due to #1  Remains able to f/c and answer questions  5. Lobar pneumonia ([place]) Will be covered with vanc and Zosyn Pt is on 2 liters at this time with no resp distress.   Due to covid symptoms of sore throat, headache, and hypoxia would continue isolation and while PCR pending.   Family/ staff Communication: discussed with his son and HCPOA Michiah Krocker  Labs/tests ordered:  BMP and CBC with diff in am Xray of Right foot 2 view CXR and covid swab pending (PCR)

## 2020-08-28 ENCOUNTER — Encounter: Payer: Self-pay | Admitting: Internal Medicine

## 2020-08-28 DIAGNOSIS — M869 Osteomyelitis, unspecified: Secondary | ICD-10-CM | POA: Diagnosis not present

## 2020-08-31 ENCOUNTER — Encounter: Payer: Self-pay | Admitting: Adult Health

## 2020-08-31 ENCOUNTER — Non-Acute Institutional Stay (SKILLED_NURSING_FACILITY): Payer: PPO | Admitting: Adult Health

## 2020-08-31 DIAGNOSIS — R638 Other symptoms and signs concerning food and fluid intake: Secondary | ICD-10-CM

## 2020-08-31 DIAGNOSIS — R410 Disorientation, unspecified: Secondary | ICD-10-CM | POA: Diagnosis not present

## 2020-08-31 DIAGNOSIS — L0390 Cellulitis, unspecified: Secondary | ICD-10-CM

## 2020-08-31 DIAGNOSIS — R0902 Hypoxemia: Secondary | ICD-10-CM

## 2020-08-31 DIAGNOSIS — J181 Lobar pneumonia, unspecified organism: Secondary | ICD-10-CM

## 2020-08-31 DIAGNOSIS — A419 Sepsis, unspecified organism: Secondary | ICD-10-CM | POA: Diagnosis not present

## 2020-08-31 MED ORDER — ALPRAZOLAM 0.25 MG PO TABS: 0.2500 mg | ORAL_TABLET | Freq: Two times a day (BID) | ORAL | 1 refills | Status: AC | PRN

## 2020-08-31 NOTE — Progress Notes (Signed)
Location:  Occupational psychologist of Service:  SNF (31) Provider:   Cindi Carbon, ANP [place] [phone]  [name], DO  Patient Care Team: [name], DO as PCP - General (Geriatric Medicine) [name], [name], MD as Referring Physician (Endocrinology) [name], MD as Consulting Physician (Orthopedic Surgery) Bond, Tracie Harrier, MD as Referring Physician (Ophthalmology) Magnus Sinning, MD as Consulting Physician (Physical Medicine and Rehabilitation)  Extended Emergency Contact Information Primary Emergency Contact: Dunlap,[name] Address: [address]          [place], [place] [address] [name] of [place] Phone: [phone] Mobile Phone: [phone] Relation: Son Secondary Emergency Contact: [name], [place] [address] [name] of [place] Phone: [phone] Mobile Phone: [phone] Relation: Spouse  Code Status:  DNR Goals of care: Advanced Directive information Advanced Directives 04/28/2020  Does Patient Have a Medical Advance Directive? Yes  Type of Advance Directive Living will;Out of facility DNR (pink MOST or yellow form)  Does patient want to make changes to medical advance directive? No - Guardian declined  Copy of [place] in Chart? -  Would patient like information on creating a medical advance directive? -  Pre-existing out of facility DNR order (yellow form or pink MOST form) Pink MOST/Yellow Form most recent copy in chart - Physician notified to receive inpatient order     Chief Complaint  Patient presents with  . Acute Visit    F/u cellulitis     HPI:  Pt is a 87 y.o. male seen today for an acute visit for f/u regarding cellulitis.   Mr. Staggers has a hx of osteomyelitis and right toe amputation. He resides in skilled care and was seen on 12/30 with fever, lethargy, decreased 02 sats, leukocytosis,  and right leg redness (on doxycycline for diabetic toe infection ).   He was given IVF and a midline was placed and Vanc and Zosyn started. He was initially on 7-8 liters of oxygen due to hypoxia but is now on 4 liters with sats in the low 90s.  He has some wheezing but denies any sob. He is confused and taking off his oxygen. He is agitated and yelling and staff and refuses care at times. One note in matrix stated he was hitting the staff.This is new for him as he does not have an underlying dx of dementia.  He has voided 2 x today and a bladder scan revealed 260cc in his bladder. His intakes do not appear adequate but the staff is not writing down everything he is eating and drinking.   He has told the staff he will not go to the hospital and will not pursue additional work up for his wound if it does not heal.   12/30 BUN 26.5 Cr 1.09  12/31 BUN 22.9 Cr 1.33  12/30 WBC 25.6 Hgb 10.9 Plt 239   12/31 WBC 12.3 Hgb 11.4 Plt 222 Neut 74.7   08/27/20  RLE doppler neg for DVT  CXR mild pulmonary infiltrate in the left lung base  Right foot xray no evidence of active osteomyelitis    Past Medical History:  Diagnosis Date  . Allergic rhinitis    Prior allergy shots 20 years  . Altered mental status    when pain is unmanaged or while on pain meds   . Anemia   . Anemia, B12 deficiency 2000  .  Arthritis    Status post left total replacement 8 2011  . BPH (benign prostatic hyperplasia) 2013  . Cancer ([place])    renal cell ca and skin cancer   . CKD (chronic kidney disease)    stage 3  . Colitis 2014  . Diabetes mellitus without complication ([place])    type 2 - no meds  . Diabetic polyneuropathy associated with type 2 diabetes mellitus ([place])   . Difficult intubation 1994   surgery had to be  stopped due to injury to "throat"  . Difficult intubation 1994   no trouble since.  Required nasotracheal intubation  '02 [facility]; awake intubation 12/15/11  . GERD (gastroesophageal reflux disease)    . Glaucoma 2007   Status post left trabeculectomy 2007  . History of colon polyps    Colonoscopy 2001  . History of kidney stones   . History of renal cell carcinoma 1994   Status post right nephrectomy  . Hyperlipidemia 2003  . Hypertension   . 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 Hypothyroidism (acquired) 2000  . Nocturia   . Osteomyelitis ([place])    right great  . Osteomyelitis ([place])    RIGHT FOOT /TOE  . Peripheral vascular disease ([place])   . Pituitary mass ([place]) 2000   S/p transphenoidal excision 05/1999 ([facility])  . Vitamin D deficiency 2009   Past Surgical History:  Procedure Laterality Date  . AMPUTATION Right 12/20/2017   Procedure: RIGHT 1ST RAY AMPUTATION;  Surgeon: [name], MD;  Location: [facility];  Service: Orthopedics;  Laterality: Right;  . AMPUTATION Right 03/23/2018   Procedure: RIGHT 2ND TOE AMPUTATION;  Surgeon: [name], MD;  Location: [facility];  Service: Orthopedics;  Laterality: Right;  . BRAIN SURGERY  2000   pituatary gland removed .  Marland Kitchen CHOLECYSTECTOMY  1984  . Ectropion surgery Bilateral 2006  . EYE SURGERY  over last 6 yrs.     trabeculectomy...   . EYE SURGERY   cat ext ou  . JOINT REPLACEMENT  2011   knee left  . KNEE ARTHROSCOPY Right 02/06/2015   Procedure: RIGHT ARTHROSCOPY KNEE WITH LATERAL MENSICAL  DEBRIDEMENT;  Surgeon: Gaynelle Arabian, MD;  Location: [facility];  Service: Orthopedics;  Laterality: Right;  . Mohs procedure      for skin cancer on nose   . NEPHRECTOMY Right 1994   Renal cell  . REVERSE SHOULDER ARTHROPLASTY  12/15/2011   Procedure: REVERSE SHOULDER ARTHROPLASTY;  Surgeon: Marin Shutter, MD;  Location: [facility];  Service: Orthopedics;  Laterality: Right;  right total reverse shoulder  . TONSILLECTOMY    . TOTAL KNEE ARTHROPLASTY Right 06/29/2015   Procedure: TOTAL KNEE ARTHROPLASTY;  Surgeon: Gaynelle Arabian, MD;  Location: [facility];  Service: Orthopedics;  Laterality: Right;   . Transsphenoidal excision pituitary tumor  05/1999   A.Tommi Rumps, M.D.([facility])    Allergies  Allergen Reactions  . Adhesive [Tape] Other (See Comments)    CAUSES SKIN TEARS, prefers paper tape   . Lyrica [Pregabalin] Swelling  . Percocet [Oxycodone-Acetaminophen] Other (See Comments)    UNSPECIFIED REACTION  Does not want to take  . Robaxin [Methocarbamol]     UNSPECIFIED REACTION   . Diovan [Valsartan] Rash    Outpatient Encounter Medications as of 08/31/2020  Medication Sig  . [DISCONTINUED] ALPRAZolam (XANAX) 0.25 MG tablet Take 0.25 mg by mouth 2 (two) times daily as needed for anxiety.  Marland Kitchen acetaminophen (TYLENOL) 500 MG  tablet Take 1,000 mg by mouth 3 (three) times daily as needed for moderate pain.   Marland Kitchen. ALPRAZolam (XANAX) 0.25 MG tablet Take 1 tablet (0.25 mg total) by mouth 2 (two) times daily as needed for anxiety.  . Cholecalciferol (VITAMIN D) 2000 units tablet Take 2,000 Units by mouth daily.  . Coenzyme Q10 300 MG CAPS Take 300 mg by mouth daily.   . Diclofenac Sodium 1.5 % SOLN Dispense 10 to 40 drops to affected area on skin as directed, twice per day  . finasteride (PROSCAR) 5 MG tablet Take 5 mg by mouth daily.   . Flaxseed, Linseed, (FLAXSEED OIL) 1000 MG CAPS Take 1,000 mg by mouth daily.  . fluticasone (FLONASE) 50 MCG/ACT nasal spray Place 2 sprays into both nostrils daily.  . mirabegron ER (MYRBETRIQ) 25 MG TB24 tablet Take 25 mg by mouth every other day.  . Multiple Vitamins-Minerals (MULTIVITAMIN & MINERAL PO) Take 1 tablet by mouth daily.  . piperacillin-tazobactam (ZOSYN) IVPB Inject 3.375 g into the vein every 6 (six) hours. X 7 days  . Polyethyl Glycol-Propyl Glycol (SYSTANE) 0.4-0.3 % SOLN Apply 1 drop to eye daily. Left eye  . polyethylene glycol (MIRALAX / GLYCOLAX) 17 g packet Take 17 g by mouth daily.  . pravastatin (PRAVACHOL) 80 MG tablet Take 80 mg by mouth every other day.   . predniSONE (DELTASONE) 5 MG tablet Take 2.5-5 mg by mouth See admin  instructions. Take 5 mg by mouth in the morning and 2.5 mg in the evening  . SYNTHROID 88 MCG tablet Take 88 mcg by mouth daily before breakfast.   . testosterone cypionate (DEPOTESTOSTERONE CYPIONATE) 200 MG/ML injection Inject 30 mg into the muscle every Friday.   . timolol (TIMOPTIC) 0.5 % ophthalmic solution Place 1 drop into the right eye daily.  . traMADol (ULTRAM) 50 MG tablet Take 0.5-1 tablets (25-50 mg total) by mouth every 6 (six) hours as needed. 25 for mild to moderate pain, 50 for severe pain  . vancomycin IVPB Inject 1,000 mg into the vein daily. 7 days  . vitamin B-12 (CYANOCOBALAMIN) 500 MCG tablet Take 500 mcg by mouth daily.   No facility-administered encounter medications on file as of 08/31/2020.    Review of Systems  Constitutional: Positive for activity change, appetite change and fatigue. Negative for chills, diaphoresis, fever and unexpected weight change.  Respiratory: Negative for cough, shortness of breath, wheezing and stridor.   Cardiovascular: Positive for leg swelling. Negative for chest pain and palpitations.  Gastrointestinal: Negative for abdominal distention, abdominal pain, constipation and diarrhea.  Genitourinary: Positive for decreased urine volume. Negative for difficulty urinating, dysuria, flank pain, frequency and urgency.  Musculoskeletal: Positive for gait problem. Negative for arthralgias, back pain, joint swelling and myalgias.  Neurological: Negative for dizziness, seizures, syncope, facial asymmetry, speech difficulty, weakness and headaches.  Hematological: Negative for adenopathy. Does not bruise/bleed easily.  Psychiatric/Behavioral: Positive for agitation, behavioral problems, confusion and hallucinations.    Immunization History  Administered Date(s) Administered  . Fluad Quad(high Dose 65+) 04/30/2019  . Influenza, High Dose Seasonal PF 04/30/2017, 06/08/2018, 06/19/2020  . Influenza, Quadrivalent, Recombinant, Inj, Pf 05/19/2016  .  Moderna Sars-Covid-2 Vaccination 09/10/2019, 10/08/2019, 07/15/2020  . Pneumococcal Conjugate-13 06/04/2015  . Pneumococcal Polysaccharide-23 06/29/2010  . Tdap 10/28/1998, 06/08/2018  . Zoster 10/27/2005  . Zoster Recombinat (Shingrix) 02/04/2018, 07/02/2018   Pertinent  Health Maintenance Due  Topic Date Due  . HEMOGLOBIN A1C  09/20/2018  . OPHTHALMOLOGY EXAM  06/04/2020  . URINE MICROALBUMIN  09/10/2020 (Originally 01/22/1933)  . FOOT EXAM  02/09/2021  . INFLUENZA VACCINE  Completed  . PNA vac Low Risk Adult  Completed   Fall Risk  07/24/2019 07/17/2019 07/05/2019 03/13/2019 11/14/2018  Falls in the past year? 0 0 0 0 1  Comment Emmi Telephone Survey: data to providers prior to load - - - -  Number falls in past yr: - 0 0 0 0  Comment - - - - -  Injury with Fall? - 0 0 0 1  Risk Factor Category  - - - - -  Risk for fall due to : - - - - -   Functional Status Survey:    Vitals:   08/31/20 1625  BP: 130/68  Pulse: 80  Resp: 20  Temp: 97.7 F (36.5 C)  SpO2: 94%   There is no height or weight on file to calculate BMI. Physical Exam Vitals and nursing note reviewed.  Constitutional:      General: He is not in acute distress.    Appearance: He is not diaphoretic.  HENT:     Head: Normocephalic and atraumatic.     Mouth/Throat:     Mouth: Mucous membranes are moist.     Pharynx: Oropharynx is clear.  Eyes:     Conjunctiva/sclera: Conjunctivae normal.     Pupils: Pupils are equal, round, and reactive to light.  Neck:     Thyroid: No thyromegaly.     Vascular: No JVD.     Trachea: No tracheal deviation.  Cardiovascular:     Rate and Rhythm: Normal rate and regular rhythm.     Heart sounds: No murmur heard.   Pulmonary:     Effort: Pulmonary effort is normal. No respiratory distress.     Breath sounds: Wheezing and rhonchi present.  Abdominal:     General: Bowel sounds are normal. There is no distension.     Palpations: Abdomen is soft.     Tenderness: There is  no abdominal tenderness.  Musculoskeletal:     Right lower leg: Edema (+1 improved) present.     Left lower leg: No edema.  Lymphadenopathy:     Cervical: No cervical adenopathy.  Skin:    General: Skin is warm and dry.     Findings: Erythema (RLE from foot to below the improved ) present.     Comments: Right 2nd toe with closed diabetic foot ulcer with no purulent drainage or erythema. Left 2nd toe with closed ulcer, no redness or drainage.   Neurological:     General: No focal deficit present.     Mental Status: He is alert.     Cranial Nerves: No cranial nerve deficit.  Psychiatric:        Mood and Affect: Mood and affect normal.     Comments: agitated     Labs reviewed: Recent Labs    02/11/20 0000 05/04/20 0000 05/08/20 0000  NA 136* 128*  128* 137  K  --  4.3  4.3 4.4  CL  --  91*  91* 100  CO2 27* 28*  28* 32*  BUN 19 21  21 20   CREATININE 0.9 1.1  1.1 1.1  CALCIUM 9.3 9.0  9.0 9.7   No results for input(s): AST, ALT, ALKPHOS, BILITOT, PROT, ALBUMIN in the last 8760 hours. Recent Labs    04/21/20 0628 05/04/20 0000 05/05/20 0000  WBC 13.4* 15.6  15.6 9.9  NEUTROABS 8.6* 12 6  HGB 13.4 10.7*  10.7* 10.8*  HCT 42.5 33*  33* 32*  MCV 102.2*  --   --   PLT 289 237  237 250   Lab Results  Component Value Date   TSH <0.010 (L) 03/20/2018   Lab Results  Component Value Date   HGBA1C 6.5 (H) 03/20/2018   Lab Results  Component Value Date   CHOL 138 08/29/2019   HDL 2 (A) 08/29/2019   LDLCALC 61 08/29/2019   TRIG 72 08/29/2019    Significant Diagnostic Results in last 30 days:  No results found.  Assessment/Plan 1. Sepsis due to cellulitis ([place]) Significant improvement noted in leg redness and swelling as well as leukocytosis. BP remains adequate with no signs of shock Continue IV Zosyn and Vanc for 7 day course, may need oral antibiotics in follow up  2. Hypoxia Oxygen requirements have reduced but remains on 4 liters and keeps taking  his oxygen off. Lungs sound congested.  Recommend F/U CXR prior to giving IVF  Duoneb q 6 prn wheeze or cough  3. Delirium Due to sepsis Xanax 0.25 mg bid prn x 14 days  4. Poor fluid intake Encourage fluid q 2 WA I and O qshift  Labs ordered for in am  If not in pulmonary edema would recommend more IVF  5. Lobar pneumonia Continues on vanc and zosyn As above    Family/ staff Communication: discussed with his wife Briant Sites and son at the bedside  Labs/tests ordered:  CXR stat BMP/CBC in am

## 2020-09-01 ENCOUNTER — Non-Acute Institutional Stay (SKILLED_NURSING_FACILITY): Payer: PPO | Admitting: Internal Medicine

## 2020-09-01 ENCOUNTER — Encounter: Payer: Self-pay | Admitting: Internal Medicine

## 2020-09-01 DIAGNOSIS — L97511 Non-pressure chronic ulcer of other part of right foot limited to breakdown of skin: Secondary | ICD-10-CM

## 2020-09-01 DIAGNOSIS — M869 Osteomyelitis, unspecified: Secondary | ICD-10-CM | POA: Diagnosis not present

## 2020-09-01 DIAGNOSIS — R410 Disorientation, unspecified: Secondary | ICD-10-CM | POA: Diagnosis not present

## 2020-09-01 DIAGNOSIS — Z515 Encounter for palliative care: Secondary | ICD-10-CM

## 2020-09-01 DIAGNOSIS — J181 Lobar pneumonia, unspecified organism: Secondary | ICD-10-CM

## 2020-09-01 DIAGNOSIS — L0390 Cellulitis, unspecified: Secondary | ICD-10-CM | POA: Diagnosis not present

## 2020-09-01 DIAGNOSIS — A419 Sepsis, unspecified organism: Secondary | ICD-10-CM

## 2020-09-01 DIAGNOSIS — Z7189 Other specified counseling: Secondary | ICD-10-CM | POA: Diagnosis not present

## 2020-09-01 DIAGNOSIS — E08621 Diabetes mellitus due to underlying condition with foot ulcer: Secondary | ICD-10-CM | POA: Diagnosis not present

## 2020-09-01 DIAGNOSIS — R0902 Hypoxemia: Secondary | ICD-10-CM | POA: Diagnosis not present

## 2020-09-01 LAB — BASIC METABOLIC PANEL
BUN: 14 (ref 4–21)
CO2: 27 — AB (ref 13–22)
Chloride: 101 (ref 99–108)
Creatinine: 1.1 (ref 0.6–1.3)
Glucose: 90
Potassium: 3.9 (ref 3.4–5.3)
Sodium: 139 (ref 137–147)

## 2020-09-01 LAB — CBC: RBC: 3.77 — AB (ref 3.87–5.11)

## 2020-09-01 LAB — CBC AND DIFFERENTIAL
HCT: 35 — AB (ref 41–53)
Hemoglobin: 11.7 — AB (ref 13.5–17.5)
Platelets: 274 (ref 150–399)
WBC: 10.6

## 2020-09-01 LAB — COMPREHENSIVE METABOLIC PANEL: Calcium: 8.6 — AB (ref 8.7–10.7)

## 2020-09-01 MED ORDER — MORPHINE SULFATE (CONCENTRATE) 20 MG/ML PO SOLN: 5.0000 mg | ORAL | 0 refills | Status: AC | PRN

## 2020-09-01 NOTE — Progress Notes (Signed)
Location:  [facility] Room Number: 132 Place of Service:  SNF (31) Provider:  [name] L. Mariea Clonts, D.O., C.M.D.  [name], DO  Patient Care Team: [name], DO as PCP - General (Geriatric Medicine) [name], [name], MD as Referring Physician (Endocrinology) [name], MD as Consulting Physician (Orthopedic Surgery) Bond, Tracie Harrier, MD as Referring Physician (Ophthalmology) Magnus Sinning, MD as Consulting Physician (Physical Medicine and Rehabilitation)  Extended Emergency Contact Information Primary Emergency Contact: Vea,[name] Address: [address]          [place],  [address] [name] of [place] Phone: [phone] Mobile Phone: [phone] Relation: Son Secondary Emergency Contact: [name],  [address] [name] of [place] Phone: [phone] Mobile Phone: [phone] Relation: Spouse  Code Status:  DNR Goals of care: Advanced Directive information Advanced Directives 09/01/2020  Does Patient Have a Medical Advance Directive? Yes  Type of Advance Directive Out of facility DNR (pink MOST or yellow form);Living will  Does patient want to make changes to medical advance directive? No - Patient declined  Copy of [place] in Chart? -  Would patient like information on creating a medical advance directive? -  Pre-existing out of facility DNR order (yellow form or pink MOST form) Pink MOST/Yellow Form most recent copy in chart - Physician notified to receive inpatient order     Chief Complaint  Patient presents with  . Acute Visit    HPI:  Pt is a 87 y.o. male seen today for an acute visit for changes in respiratory pattern, continued waxing and waning consciousness and confusion, concerns about prognosis.  Sally had been treated for cellulitis related to diabetic ulcers of his toes.  He has had improvement  in the outward signs of infection on the legs and toes and xrays to look for osteomyelitis were negative (but not as sensitive as MRI).  He has had a course of vanc and zosyn and this was extended by NP due to also having pneumonia and ongoing delirium.  He's had gurgling, periods of accessory muscle use for respirations and other periods of "quiet" as his wife, Tootsie, puts it.  She declined ativan due to other personal experiences she's had with its use in others.  He is not eating.  He wakes up a drinks a little bit.  His face has gotten puffy.  He's been getting a liter of NS at 80cc/hr.  He has been clear he does not want to go to the hospital and recently expressed that if he needed more surgery for osteomyelitis, he would stop eating.  He has said he's had a great life.  He has been asking if other family members might be coming to see him.  His son came this evening and we discussed his current status which includes periods of apnea.  Tootsie had wanted [name]'s input before deciding about stopping the IVFs and antibiotics as I recommended due to lack of improvement and now some developing signs of volume overload that could be more uncomfortable than beneficial to him.  DON also assisted with discussion re: oxygen and this was discontinued--he'd gotten up to 6 liters at one point.  [name] did agree that fluids and abx should be stopped and his IV was discontinued.  Prognosis of hours to days was shared and they decided that other family had seen him in a better state of health  more a couple of weeks prior so they would not include anyone else at this point.  Past Medical History:  Diagnosis Date  . Allergic rhinitis    Prior allergy shots 20 years  . Altered mental status    when pain is unmanaged or while on pain meds   . Anemia   . Anemia, B12 deficiency 2000  . Arthritis    Status post left total replacement 8 2011  . BPH (benign prostatic hyperplasia) 2013  . Cancer (HCC)    renal cell ca  and skin cancer   . CKD (chronic kidney disease)    stage 3  . Colitis 2014  . Diabetes mellitus without complication (HCC)    type 2 - no meds  . Diabetic polyneuropathy associated with type 2 diabetes mellitus (HCC)   . Difficult intubation 1994   surgery had to be  stopped due to injury to "throat"  . Difficult intubation 1994   no trouble since.  Required nasotracheal intubation  '02 [name]; awake intubation 12/15/11  . GERD (gastroesophageal reflux disease)   . Glaucoma 2007   Status post left trabeculectomy 2007  . History of colon polyps    Colonoscopy 2001  . History of kidney stones   . History of renal cell carcinoma 1994   Status post right nephrectomy  . Hyperlipidemia 2003  . Hypertension   . 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 Hypothyroidism (acquired) 2000  . Nocturia   . Osteomyelitis (HCC)    right great  . Osteomyelitis (HCC)    RIGHT FOOT /TOE  . Peripheral vascular disease (HCC)   . Pituitary mass (HCC) 2000   S/p transphenoidal excision 05/1999 ([facility])  . Vitamin D deficiency 2009   Past Surgical History:  Procedure Laterality Date  . AMPUTATION Right 12/20/2017   Procedure: RIGHT 1ST RAY AMPUTATION;  Surgeon: Nadara Mustard, MD;  Location: [facility] OR;  Service: Orthopedics;  Laterality: Right;  . AMPUTATION Right 03/23/2018   Procedure: RIGHT 2ND TOE AMPUTATION;  Surgeon: Nadara Mustard, MD;  Location: [facility] OR;  Service: Orthopedics;  Laterality: Right;  . BRAIN SURGERY  2000   pituatary gland removed .  Marland Kitchen CHOLECYSTECTOMY  1984  . Ectropion surgery Bilateral 2006  . EYE SURGERY  over last 6 yrs.     trabeculectomy...   . EYE SURGERY   cat ext ou  . JOINT REPLACEMENT  2011   knee left  . KNEE ARTHROSCOPY Right 02/06/2015   Procedure: RIGHT ARTHROSCOPY KNEE WITH LATERAL MENSICAL  DEBRIDEMENT;  Surgeon: Ollen Gross, MD;  Location: [facility] ORS;  Service: Orthopedics;  Laterality: Right;  . Mohs procedure       for skin cancer on nose   . NEPHRECTOMY Right 1994   Renal cell  . REVERSE SHOULDER ARTHROPLASTY  12/15/2011   Procedure: REVERSE SHOULDER ARTHROPLASTY;  Surgeon: Senaida Lange, MD;  Location: [facility] OR;  Service: Orthopedics;  Laterality: Right;  right total reverse shoulder  . TONSILLECTOMY    . TOTAL KNEE ARTHROPLASTY Right 06/29/2015   Procedure: TOTAL KNEE ARTHROPLASTY;  Surgeon: Ollen Gross, MD;  Location: [facility] ORS;  Service: Orthopedics;  Laterality: Right;  . Transsphenoidal excision pituitary tumor  05/1999   A.Zachery Conch, M.D.([facility])    Allergies  Allergen Reactions  . Adhesive [Tape] Other (See Comments)    CAUSES SKIN TEARS, prefers paper tape   . Lyrica [Pregabalin] Swelling  . Percocet [Oxycodone-Acetaminophen] Other (See Comments)  UNSPECIFIED REACTION  Does not want to take  . Robaxin [Methocarbamol]     UNSPECIFIED REACTION   . Diovan [Valsartan] Rash    Outpatient Encounter Medications as of 09/01/2020  Medication Sig  . acetaminophen (TYLENOL) 500 MG tablet Take 1,000 mg by mouth 3 (three) times daily as needed for moderate pain.   Marland Kitchen ALPRAZolam (XANAX) 0.25 MG tablet Take 1 tablet (0.25 mg total) by mouth 2 (two) times daily as needed for anxiety.  . Cholecalciferol (VITAMIN D) 2000 units tablet Take 2,000 Units by mouth daily.  . Coenzyme Q10 300 MG CAPS Take 300 mg by mouth daily.   . Diclofenac Sodium 1.5 % SOLN Dispense 10 to 40 drops to affected area on skin as directed, twice per day  . finasteride (PROSCAR) 5 MG tablet Take 5 mg by mouth daily.   . Flaxseed, Linseed, (FLAXSEED OIL) 1000 MG CAPS Take 1,000 mg by mouth daily.  . fluticasone (FLONASE) 50 MCG/ACT nasal spray Place 2 sprays into both nostrils daily.  Marland Kitchen ipratropium-albuterol (DUONEB) 0.5-2.5 (3) MG/3ML SOLN Take 3 mLs by nebulization.  . mirabegron ER (MYRBETRIQ) 25 MG TB24 tablet Take 25 mg by mouth every other day.  . morphine (ROXANOL) 20 MG/ML concentrated solution Take 0.25 mLs (5 mg total)  by mouth every 2 (two) hours as needed for severe pain or shortness of breath.  . Multiple Vitamins-Minerals (MULTIVITAMIN & MINERAL PO) Take 1 tablet by mouth daily.  . ondansetron (ZOFRAN) 4 MG tablet Take 4 mg by mouth every 6 (six) hours as needed for nausea or vomiting.  Bertram Gala Glycol-Propyl Glycol (SYSTANE) 0.4-0.3 % SOLN Apply 1 drop to eye daily. Left eye  . polyethylene glycol (MIRALAX / GLYCOLAX) 17 g packet Take 17 g by mouth daily.  . pravastatin (PRAVACHOL) 80 MG tablet Take 80 mg by mouth every other day.   . predniSONE (DELTASONE) 5 MG tablet Take 2.5-5 mg by mouth See admin instructions. Take 5 mg by mouth in the morning and 2.5 mg in the evening  . SYNTHROID 88 MCG tablet Take 88 mcg by mouth daily before breakfast.   . testosterone cypionate (DEPOTESTOSTERONE CYPIONATE) 200 MG/ML injection Inject 30 mg into the muscle every Friday.   . timolol (TIMOPTIC) 0.5 % ophthalmic solution Place 1 drop into the right eye daily.  . traMADol (ULTRAM) 50 MG tablet Take 0.5-1 tablets (25-50 mg total) by mouth every 6 (six) hours as needed. 25 for mild to moderate pain, 50 for severe pain  . vancomycin IVPB Inject 1,000 mg into the vein daily. 7 days  . vitamin B-12 (CYANOCOBALAMIN) 500 MCG tablet Take 500 mcg by mouth daily.  . piperacillin-tazobactam (ZOSYN) IVPB Inject 3.375 g into the vein every 6 (six) hours. X 7 days   No facility-administered encounter medications on file as of 09/01/2020.    Review of Systems  Constitutional: Positive for malaise/fatigue. Negative for chills and fever.  HENT: Positive for hearing loss. Negative for sore throat.   Eyes:       Glasses, no longer able to focus on family faces  Respiratory: Negative for shortness of breath.        Rattles  Cardiovascular: Negative for chest pain.  Gastrointestinal: Negative for abdominal pain and constipation.  Genitourinary: Negative for dysuria.  Musculoskeletal: Negative for back pain, falls, joint pain,  myalgias and neck pain.  Neurological: Negative for dizziness and loss of consciousness.       Decreasing responsiveness  Endo/Heme/Allergies: Bruises/bleeds easily.  Psychiatric/Behavioral:  Confusion    Immunization History  Administered Date(s) Administered  . Fluad Quad(high Dose 65+) 04/30/2019  . Influenza, High Dose Seasonal PF 04/30/2017, 06/08/2018, 06/19/2020  . Influenza, Quadrivalent, Recombinant, Inj, Pf 05/19/2016  . Moderna Sars-Covid-2 Vaccination 09/10/2019, 10/08/2019, 07/15/2020  . Pneumococcal Conjugate-13 06/04/2015  . Pneumococcal Polysaccharide-23 06/29/2010  . Tdap 10/28/1998, 06/08/2018  . Zoster 10/27/2005  . Zoster Recombinat (Shingrix) 02/04/2018, 07/02/2018   There are no preventive care reminders to display for this patient. Fall Risk  07/24/2019 07/17/2019 07/05/2019 03/13/2019 11/14/2018  Falls in the past year? 0 0 0 0 1  Comment Emmi Telephone Survey: data to providers prior to load - - - -  Number falls in past yr: - 0 0 0 0  Comment - - - - -  Injury with Fall? - 0 0 0 1  Risk Factor Category  - - - - -  Risk for fall due to : - - - - -   Functional Status Survey:    Vitals:   09/01/20 1635  BP: (!) 148/70  Pulse: 68  Temp: (!) 97.3 F (36.3 C)  SpO2: 94%  Weight: 175 lb 14.4 oz (79.8 kg)  Height: 5\' 11"  (1.803 m)   Body mass index is 24.53 kg/m. Physical Exam Vitals and nursing note reviewed.  Constitutional:      General: He is not in acute distress.    Appearance: He is ill-appearing.     Comments: Waxing and waning alertness, able to answer a couple of questions only  HENT:     Head: Normocephalic and atraumatic.     Comments: Swelling of face/neck    Right Ear: External ear normal.     Left Ear: External ear normal.     Nose: No congestion.     Mouth/Throat:     Mouth: Mucous membranes are dry.     Pharynx: Oropharynx is clear.  Eyes:     Conjunctiva/sclera: Conjunctivae normal.     Pupils: Pupils are equal,  round, and reactive to light.  Cardiovascular:     Rate and Rhythm: Normal rate and regular rhythm.  Pulmonary:     Breath sounds: Rhonchi present.     Comments: Rattle in airways; several second periods of apnea observed Abdominal:     General: Bowel sounds are normal.     Tenderness: There is no abdominal tenderness. There is no guarding or rebound.  Musculoskeletal:     Cervical back: Neck supple.     Right lower leg: No edema.     Left lower leg: No edema.     Comments: Resting in bed with feet elevated, only minimal erythema to shins at this point, dressings intact to toes  Neurological:     Comments: Lethargic, garbled speech when arousable     Labs reviewed: Recent Labs    05/04/20 0000 05/08/20 0000 08/27/20 0000  NA 128*  128* 137 133*  K 4.3  4.3 4.4 4.2  CL 91*  91* 100 97*  CO2 28*  28* 32* 27*  BUN 21  21 20  27*  CREATININE 1.1  1.1 1.1 1.1  CALCIUM 9.0  9.0 9.7 9.0   Recent Labs    08/27/20 0000  ALBUMIN 2.9*   Recent Labs    04/21/20 0628 05/04/20 0000 05/05/20 0000 08/27/20 0000  WBC 13.4* 15.6  15.6 9.9 25.6  NEUTROABS 8.6* 12 6  --   HGB 13.4 10.7*  10.7* 10.8* 10.9*  HCT 42.5 33*  33* 32*  33*  MCV 102.2*  --   --   --   PLT 289 237  237 250 239   Lab Results  Component Value Date   TSH <0.010 (L) 03/20/2018   Lab Results  Component Value Date   HGBA1C 6.5 (H) 03/20/2018   Lab Results  Component Value Date   CHOL 138 08/29/2019   HDL 2 (A) 08/29/2019   LDLCALC 61 08/29/2019   TRIG 72 08/29/2019    Significant Diagnostic Results in last 30 days:  No results found.  Assessment/Plan 1. Sepsis due to cellulitis ([place]) - appears this actually did improve but he's also had pneumonia and ongoing respiratory failure, now reaching end of life--not eating, barely drinking -d/c abx and IVFs as discussed with wife and son today - morphine (ROXANOL) 20 MG/ML concentrated solution; Take 0.25 mLs (5 mg total) by mouth every 2  (two) hours as needed for severe pain or shortness of breath.  Dispense: 30 mL; Refill: 0  2. Delirium -due to end of life, respiratory failure, pneumonia, possible osteomyelitis - morphine (ROXANOL) 20 MG/ML concentrated solution; Take 0.25 mLs (5 mg total) by mouth every 2 (two) hours as needed for severe pain or shortness of breath.  Dispense: 30 mL; Refill: 0  3. Lobar pneumonia ([place]) - with respiratory failure -having periods of apnea and death rattle -provided morphine orders and discontinued oxygen at this point with goals being comfort-based and prognosis of hours to days - morphine (ROXANOL) 20 MG/ML concentrated solution; Take 0.25 mLs (5 mg total) by mouth every 2 (two) hours as needed for severe pain or shortness of breath.  Dispense: 30 mL; Refill: 0  4. Diabetic ulcer of toe of right foot associated with diabetes mellitus due to underlying condition, limited to breakdown of skin ([place]) -remains but local skin infection improved - morphine (ROXANOL) 20 MG/ML concentrated solution; Take 0.25 mLs (5 mg total) by mouth every 2 (two) hours as needed for severe pain or shortness of breath.  Dispense: 30 mL; Refill: 0  5. End of life care -added the roxanol, might consider the ativan and atropine if family desires for the increased secretions but pt does not appear uncomfortable from them - morphine (ROXANOL) 20 MG/ML concentrated solution; Take 0.25 mLs (5 mg total) by mouth every 2 (two) hours as needed for severe pain or shortness of breath.  Dispense: 30 mL; Refill: 0  6. ACP (advance care planning) - 40 mins spent on ACP today with his wife at one point and then his son later in the evening - morphine (ROXANOL) 20 MG/ML concentrated solution; Take 0.25 mLs (5 mg total) by mouth every 2 (two) hours as needed for severe pain or shortness of breath.  Dispense: 30 mL; Refill: 0 -hospice care from outside program declined  Family/ staff Communication: d/w his wife, son and  nursing  Labs/tests ordered:  None d/t goals of care  Amos Gaber L. Taren Dymek, D.O. [facility] [address], [place] [address] Cell Phone (Mon-Fri 8am-5pm):  [phone] On Call:  [phone] & follow prompts after 5pm & weekends Office Phone:  [phone] Office Fax:  [phone]

## 2020-09-02 ENCOUNTER — Other Ambulatory Visit: Payer: Self-pay

## 2020-09-02 NOTE — Progress Notes (Signed)
A user error has taken place: encounter opened in error, closed for administrative reasons, orders placed in error, not carried out on this patient.

## 2020-09-05 ENCOUNTER — Encounter: Payer: Self-pay | Admitting: Internal Medicine

## 2020-09-15 ENCOUNTER — Encounter: Payer: Self-pay | Admitting: *Deleted

## 2020-09-21 ENCOUNTER — Encounter (HOSPITAL_BASED_OUTPATIENT_CLINIC_OR_DEPARTMENT_OTHER): Payer: PPO | Admitting: Internal Medicine

## 2020-09-29 DEATH — deceased

## 2021-07-12 ENCOUNTER — Encounter: Payer: PPO | Admitting: Family
